# Patient Record
Sex: Female | Born: 1935 | Race: White | Hispanic: No | Marital: Married | Smoking: Never smoker
Health system: Southern US, Community
[De-identification: ages and names within clinical notes are randomized; demographics above are authoritative.]

## PROBLEM LIST (undated history)

## (undated) DIAGNOSIS — C801 Malignant (primary) neoplasm, unspecified: Secondary | ICD-10-CM

## (undated) DIAGNOSIS — M1990 Unspecified osteoarthritis, unspecified site: Secondary | ICD-10-CM

## (undated) DIAGNOSIS — C189 Malignant neoplasm of colon, unspecified: Secondary | ICD-10-CM

## (undated) DIAGNOSIS — C50919 Malignant neoplasm of unspecified site of unspecified female breast: Secondary | ICD-10-CM

## (undated) DIAGNOSIS — I10 Essential (primary) hypertension: Secondary | ICD-10-CM

## (undated) DIAGNOSIS — Z85038 Personal history of other malignant neoplasm of large intestine: Secondary | ICD-10-CM

## (undated) HISTORY — DX: Essential (primary) hypertension: I10

## (undated) HISTORY — DX: Malignant neoplasm of unspecified site of unspecified female breast: C50.919

## (undated) HISTORY — DX: Personal history of other malignant neoplasm of large intestine: Z85.038

## (undated) HISTORY — DX: Malignant neoplasm of colon, unspecified: C18.9

## (undated) HISTORY — PX: TONSILLECTOMY: SUR1361

## (undated) HISTORY — PX: COLONOSCOPY: SHX174

## (undated) HISTORY — PX: WISDOM TOOTH EXTRACTION: SHX21

## (undated) MED FILL — Docetaxel Soln for IV Infusion 160 MG/16ML: INTRAVENOUS | Qty: 9 | Status: AC

## (undated) MED FILL — Fam-Trastuzumab Deruxtecan-nxki For IV Soln 100 MG: INTRAVENOUS | Qty: 10 | Status: AC

## (undated) MED FILL — Zoledronic Acid Inj Conc For IV Infusion 4 MG/5ML: INTRAVENOUS | Qty: 3.75 | Status: AC

## (undated) MED FILL — Dexamethasone Sodium Phosphate Inj 100 MG/10ML: INTRAMUSCULAR | Qty: 1 | Status: AC

---

## 2005-05-10 HISTORY — PX: HEMICOLECTOMY: SHX854

## 2005-10-06 ENCOUNTER — Ambulatory Visit: Payer: Self-pay | Admitting: Oncology

## 2005-10-19 LAB — CBC WITH DIFFERENTIAL/PLATELET
BASO%: 1 % (ref 0.0–2.0)
HCT: 35.3 % (ref 34.8–46.6)
LYMPH%: 34.6 % (ref 14.0–48.0)
MCHC: 34.2 g/dL (ref 32.0–36.0)
MCV: 91.9 fL (ref 81.0–101.0)
MONO%: 8.8 % (ref 0.0–13.0)
NEUT%: 52.6 % (ref 39.6–76.8)
Platelets: 216 10*3/uL (ref 145–400)
RBC: 3.84 10*6/uL (ref 3.70–5.32)

## 2005-10-19 LAB — COMPREHENSIVE METABOLIC PANEL
ALT: 18 U/L (ref 0–40)
Albumin: 4.4 g/dL (ref 3.5–5.2)
CO2: 27 mEq/L (ref 19–32)
Calcium: 10 mg/dL (ref 8.4–10.5)
Chloride: 102 mEq/L (ref 96–112)
Potassium: 4.2 mEq/L (ref 3.5–5.3)
Sodium: 137 mEq/L (ref 135–145)
Total Protein: 7.2 g/dL (ref 6.0–8.3)

## 2005-10-19 LAB — LACTATE DEHYDROGENASE: LDH: 171 U/L (ref 94–250)

## 2005-10-19 LAB — PROTIME-INR: Protime: 11.2 Seconds (ref 10.6–13.4)

## 2005-11-16 ENCOUNTER — Ambulatory Visit: Payer: Self-pay | Admitting: Oncology

## 2005-11-22 LAB — CBC WITH DIFFERENTIAL/PLATELET
Basophils Absolute: 0 10*3/uL (ref 0.0–0.1)
Eosinophils Absolute: 0.1 10*3/uL (ref 0.0–0.5)
HCT: 35.3 % (ref 34.8–46.6)
HGB: 12.1 g/dL (ref 11.6–15.9)
LYMPH%: 37.7 % (ref 14.0–48.0)
MCV: 94.8 fL (ref 81.0–101.0)
MONO%: 12 % (ref 0.0–13.0)
NEUT#: 2.3 10*3/uL (ref 1.5–6.5)
NEUT%: 48.1 % (ref 39.6–76.8)
Platelets: 207 10*3/uL (ref 145–400)
RBC: 3.73 10*6/uL (ref 3.70–5.32)

## 2005-12-31 LAB — COMPREHENSIVE METABOLIC PANEL
AST: 26 U/L (ref 0–37)
Albumin: 3.8 g/dL (ref 3.5–5.2)
Alkaline Phosphatase: 68 U/L (ref 39–117)
BUN: 30 mg/dL — ABNORMAL HIGH (ref 6–23)
Potassium: 3.8 mEq/L (ref 3.5–5.3)
Total Bilirubin: 1.1 mg/dL (ref 0.3–1.2)

## 2005-12-31 LAB — CBC WITH DIFFERENTIAL/PLATELET
BASO%: 0.3 % (ref 0.0–2.0)
Eosinophils Absolute: 0 10*3/uL (ref 0.0–0.5)
MCHC: 34.8 g/dL (ref 32.0–36.0)
MCV: 98.7 fL (ref 81.0–101.0)
MONO#: 1.6 10*3/uL — ABNORMAL HIGH (ref 0.1–0.9)
MONO%: 25.8 % — ABNORMAL HIGH (ref 0.0–13.0)
NEUT#: 3.4 10*3/uL (ref 1.5–6.5)
RBC: 4.16 10*6/uL (ref 3.70–5.32)
RDW: 21.5 % — ABNORMAL HIGH (ref 11.3–14.5)
WBC: 6.3 10*3/uL (ref 3.9–10.0)

## 2006-01-06 ENCOUNTER — Ambulatory Visit: Payer: Self-pay | Admitting: Oncology

## 2006-03-18 ENCOUNTER — Ambulatory Visit: Payer: Self-pay | Admitting: Oncology

## 2006-03-22 LAB — COMPREHENSIVE METABOLIC PANEL
Albumin: 4.4 g/dL (ref 3.5–5.2)
BUN: 15 mg/dL (ref 6–23)
CO2: 25 mEq/L (ref 19–32)
Calcium: 9.2 mg/dL (ref 8.4–10.5)
Chloride: 106 mEq/L (ref 96–112)
Creatinine, Ser: 0.93 mg/dL (ref 0.40–1.20)
Glucose, Bld: 96 mg/dL (ref 70–99)

## 2006-03-22 LAB — CBC WITH DIFFERENTIAL/PLATELET
BASO%: 0.8 % (ref 0.0–2.0)
EOS%: 1.8 % (ref 0.0–7.0)
HCT: 36.1 % (ref 34.8–46.6)
LYMPH%: 41.3 % (ref 14.0–48.0)
MCH: 37.7 pg — ABNORMAL HIGH (ref 26.0–34.0)
MCHC: 35.4 g/dL (ref 32.0–36.0)
MCV: 106.4 fL — ABNORMAL HIGH (ref 81.0–101.0)
MONO%: 12.1 % (ref 0.0–13.0)
NEUT%: 44 % (ref 39.6–76.8)
Platelets: 191 10*3/uL (ref 145–400)
lymph#: 1.8 10*3/uL (ref 0.9–3.3)

## 2006-03-22 LAB — CEA: CEA: <0.5 ng/mL (ref 0.0–5.0)

## 2006-09-16 ENCOUNTER — Ambulatory Visit: Payer: Self-pay | Admitting: Oncology

## 2006-09-20 LAB — COMPREHENSIVE METABOLIC PANEL
AST: 20 U/L (ref 0–37)
Albumin: 4.4 g/dL (ref 3.5–5.2)
BUN: 18 mg/dL (ref 6–23)
CO2: 26 mEq/L (ref 19–32)
Calcium: 9.4 mg/dL (ref 8.4–10.5)
Chloride: 105 mEq/L (ref 96–112)
Potassium: 4.1 mEq/L (ref 3.5–5.3)

## 2006-09-20 LAB — CBC WITH DIFFERENTIAL/PLATELET
BASO%: 0.4 % (ref 0.0–2.0)
Basophils Absolute: 0 10*3/uL (ref 0.0–0.1)
Eosinophils Absolute: 0.1 10*3/uL (ref 0.0–0.5)
HCT: 41.4 % (ref 34.8–46.6)
MCHC: 35.2 g/dL (ref 32.0–36.0)
MONO#: 0.4 10*3/uL (ref 0.1–0.9)
NEUT%: 57.1 % (ref 39.6–76.8)
Platelets: 185 10*3/uL (ref 145–400)
lymph#: 1.6 10*3/uL (ref 0.9–3.3)

## 2006-09-20 LAB — LACTATE DEHYDROGENASE: LDH: 167 U/L (ref 94–250)

## 2007-03-17 ENCOUNTER — Ambulatory Visit: Payer: Self-pay | Admitting: Oncology

## 2007-03-21 LAB — COMPREHENSIVE METABOLIC PANEL
BUN: 15 mg/dL (ref 6–23)
CO2: 24 mEq/L (ref 19–32)
Calcium: 10 mg/dL (ref 8.4–10.5)
Chloride: 105 mEq/L (ref 96–112)
Creatinine, Ser: 0.91 mg/dL (ref 0.40–1.20)

## 2007-03-21 LAB — CBC WITH DIFFERENTIAL/PLATELET
Basophils Absolute: 0 10*3/uL (ref 0.0–0.1)
EOS%: 1.1 % (ref 0.0–7.0)
HCT: 40.3 % (ref 34.8–46.6)
HGB: 14.1 g/dL (ref 11.6–15.9)
MCH: 32.4 pg (ref 26.0–34.0)
NEUT%: 54.1 % (ref 39.6–76.8)
Platelets: 197 10*3/uL (ref 145–400)
lymph#: 1.7 10*3/uL (ref 0.9–3.3)

## 2007-03-21 LAB — LACTATE DEHYDROGENASE: LDH: 184 U/L (ref 94–250)

## 2007-09-08 ENCOUNTER — Ambulatory Visit: Payer: Self-pay | Admitting: Oncology

## 2008-03-22 ENCOUNTER — Ambulatory Visit: Payer: Self-pay | Admitting: Oncology

## 2008-03-26 LAB — CBC WITH DIFFERENTIAL/PLATELET
BASO%: 0.5 % (ref 0.0–2.0)
EOS%: 0.9 % (ref 0.0–7.0)
HCT: 41.3 % (ref 34.8–46.6)
MCH: 32.5 pg (ref 26.0–34.0)
MCHC: 34.8 g/dL (ref 32.0–36.0)
MONO#: 0.5 10*3/uL (ref 0.1–0.9)
NEUT%: 54.4 % (ref 39.6–76.8)
RDW: 12.9 % (ref 11.3–14.5)
WBC: 4.8 10*3/uL (ref 3.9–10.0)
lymph#: 1.6 10*3/uL (ref 0.9–3.3)

## 2008-03-26 LAB — COMPREHENSIVE METABOLIC PANEL
ALT: 11 U/L (ref 0–35)
AST: 19 U/L (ref 0–37)
Albumin: 4.4 g/dL (ref 3.5–5.2)
CO2: 26 mEq/L (ref 19–32)
Calcium: 9.4 mg/dL (ref 8.4–10.5)
Chloride: 105 mEq/L (ref 96–112)
Creatinine, Ser: 0.9 mg/dL (ref 0.40–1.20)
Potassium: 4.3 mEq/L (ref 3.5–5.3)
Sodium: 138 mEq/L (ref 135–145)
Total Protein: 7.2 g/dL (ref 6.0–8.3)

## 2008-03-26 LAB — LACTATE DEHYDROGENASE: LDH: 177 U/L (ref 94–250)

## 2008-09-06 ENCOUNTER — Ambulatory Visit: Payer: Self-pay | Admitting: Oncology

## 2009-09-08 ENCOUNTER — Ambulatory Visit: Payer: Self-pay | Admitting: Oncology

## 2009-09-09 LAB — CBC WITH DIFFERENTIAL/PLATELET
BASO%: 0.5 % (ref 0.0–2.0)
Basophils Absolute: 0 10*3/uL (ref 0.0–0.1)
EOS%: 1.6 % (ref 0.0–7.0)
MCH: 32.6 pg (ref 25.1–34.0)
MCHC: 34.2 g/dL (ref 31.5–36.0)
MCV: 95.3 fL (ref 79.5–101.0)
MONO%: 10.2 % (ref 0.0–14.0)
RBC: 4.2 10*6/uL (ref 3.70–5.45)
RDW: 12.9 % (ref 11.2–14.5)

## 2009-09-09 LAB — COMPREHENSIVE METABOLIC PANEL
ALT: 15 U/L (ref 0–35)
AST: 22 U/L (ref 0–37)
Albumin: 4.4 g/dL (ref 3.5–5.2)
Alkaline Phosphatase: 69 U/L (ref 39–117)
BUN: 19 mg/dL (ref 6–23)
Potassium: 4.4 mEq/L (ref 3.5–5.3)
Sodium: 138 mEq/L (ref 135–145)

## 2010-09-08 ENCOUNTER — Other Ambulatory Visit: Payer: Self-pay | Admitting: Oncology

## 2010-09-08 ENCOUNTER — Encounter (HOSPITAL_BASED_OUTPATIENT_CLINIC_OR_DEPARTMENT_OTHER): Payer: Medicare Other | Admitting: Oncology

## 2010-09-08 DIAGNOSIS — C187 Malignant neoplasm of sigmoid colon: Secondary | ICD-10-CM

## 2010-09-08 LAB — CBC WITH DIFFERENTIAL/PLATELET
BASO%: 0.5 % (ref 0.0–2.0)
Eosinophils Absolute: 0.1 10*3/uL (ref 0.0–0.5)
HCT: 40.3 % (ref 34.8–46.6)
LYMPH%: 28.6 % (ref 14.0–49.7)
MCHC: 34.7 g/dL (ref 31.5–36.0)
MONO#: 0.5 10*3/uL (ref 0.1–0.9)
NEUT%: 59.7 % (ref 38.4–76.8)
Platelets: 166 10*3/uL (ref 145–400)
WBC: 5 10*3/uL (ref 3.9–10.3)

## 2010-09-08 LAB — LACTATE DEHYDROGENASE: LDH: 187 U/L (ref 94–250)

## 2010-09-08 LAB — COMPREHENSIVE METABOLIC PANEL
CO2: 22 mEq/L (ref 19–32)
Creatinine, Ser: 1 mg/dL (ref 0.40–1.20)
Glucose, Bld: 93 mg/dL (ref 70–99)
Total Bilirubin: 0.5 mg/dL (ref 0.3–1.2)

## 2010-09-08 LAB — CEA: CEA: 1 ng/mL (ref 0.0–5.0)

## 2010-11-01 ENCOUNTER — Ambulatory Visit: Payer: Self-pay | Admitting: Orthopedic Surgery

## 2011-09-03 ENCOUNTER — Telehealth: Payer: Self-pay | Admitting: Oncology

## 2011-09-03 NOTE — Telephone Encounter (Signed)
S/w pt re appt for 7/5.

## 2011-11-12 ENCOUNTER — Encounter: Payer: Self-pay | Admitting: Oncology

## 2011-11-12 ENCOUNTER — Ambulatory Visit (HOSPITAL_BASED_OUTPATIENT_CLINIC_OR_DEPARTMENT_OTHER): Payer: Medicare Other | Admitting: Oncology

## 2011-11-12 ENCOUNTER — Telehealth: Payer: Self-pay | Admitting: Oncology

## 2011-11-12 ENCOUNTER — Other Ambulatory Visit (HOSPITAL_BASED_OUTPATIENT_CLINIC_OR_DEPARTMENT_OTHER): Payer: Medicare Other

## 2011-11-12 VITALS — BP 177/95 | HR 62 | Temp 97.0°F | Ht 63.0 in | Wt 146.9 lb

## 2011-11-12 DIAGNOSIS — Z85038 Personal history of other malignant neoplasm of large intestine: Secondary | ICD-10-CM

## 2011-11-12 DIAGNOSIS — C187 Malignant neoplasm of sigmoid colon: Secondary | ICD-10-CM

## 2011-11-12 HISTORY — DX: Personal history of other malignant neoplasm of large intestine: Z85.038

## 2011-11-12 LAB — COMPREHENSIVE METABOLIC PANEL
ALT: 12 U/L (ref 0–35)
AST: 22 U/L (ref 0–37)
CO2: 24 mEq/L (ref 19–32)
Calcium: 9.2 mg/dL (ref 8.4–10.5)
Chloride: 106 mEq/L (ref 96–112)
Sodium: 138 mEq/L (ref 135–145)
Total Bilirubin: 0.4 mg/dL (ref 0.3–1.2)
Total Protein: 6.8 g/dL (ref 6.0–8.3)

## 2011-11-12 LAB — CBC WITH DIFFERENTIAL/PLATELET
Eosinophils Absolute: 0.1 10*3/uL (ref 0.0–0.5)
MONO#: 0.6 10*3/uL (ref 0.1–0.9)
NEUT#: 2.6 10*3/uL (ref 1.5–6.5)
RBC: 4.36 10*6/uL (ref 3.70–5.45)
RDW: 13 % (ref 11.2–14.5)
WBC: 5.3 10*3/uL (ref 3.9–10.3)
lymph#: 2 10*3/uL (ref 0.9–3.3)

## 2011-11-12 LAB — LACTATE DEHYDROGENASE: LDH: 186 U/L (ref 94–250)

## 2011-11-12 LAB — CEA: CEA: 0.7 ng/mL (ref 0.0–5.0)

## 2011-11-12 NOTE — Telephone Encounter (Signed)
Gave pt appt for next year July 2014 lab and MD, pt was given a prescription by MD for mammogram in [place], she will maker her own appt.

## 2011-11-12 NOTE — Progress Notes (Signed)
Hematology and Oncology Follow Up Visit  Jasmin Lewis [id number] Jan 08, 1932 78 y.o. 11/12/2011 6:58 PM   Principle Diagnosis: Encounter Diagnosis  Name Primary?  . 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  She underwent surgical resection on 09/21/2005.  Findings were a 5.9 x 3.8 x 1.2 cm moderate to well-differentiated adenocarcinoma penetrating the muscular wall.  Forty lymph nodes negative.  No vascular or lymphatic invasion.  Multiple diverticula with microabscess formation and inflammation.  Carcinoma present in at least 1 diverticulum.  Preop CEA 5.2 with lab normal range 0 to 2.5.  Preop CT scan with no obvious additional pathology.  She received oral Xeloda chemotherapy as an adjuvant.    She continues to do well. She has had no interim medical problems. She denies any headache or change in vision, no change in bowel habit, no hematochezia or melena, no vaginal bleeding. No pain.  She never followed up and scheduled a 5 year interval colonoscopy or mammograms subsequent to her visit with me last year.  Medications: reviewed  Allergies: No Known Allergies  Review of Systems: Constitutional:   No constitutional symptoms Respiratory: No cough or dyspnea Cardiovascular:  No chest pain or palpitations Gastrointestinal: See above Genito-Urinary: See above Musculoskeletal: See above Neurologic: See above Skin: No rash Remaining ROS negative.  Physical Exam: Blood pressure 177/95, pulse 62, temperature 97 F (36.1 C), temperature source Oral, height 5\' 3"  (1.6 m), weight 146 lb 14.4 oz (66.633 kg). Wt Readings from Last 3 Encounters:  11/12/11 146 lb 14.4 oz (66.633 kg)     General appearance: Well-nourished Caucasian woman HENNT:  Pharynx no erythema or exudate Lymph nodes: No adenopathy Breasts: Not examined Lungs: Clear to auscultation  resonant to percussion Heart: Regular rhythm no murmur Abdomen: Soft nontender no mass no organomegaly Extremities: No edema no calf tenderness Vascular: No cyanosis Neurologic: No focal deficit Skin: No rash or ecchymosis  Lab Results: Lab Results  Component Value Date   WBC 5.3 11/12/2011   HGB 13.9 11/12/2011   HCT 41.0 11/12/2011   MCV 93.9 11/12/2011   PLT 157 11/12/2011     Chemistry      Component Value Date/Time   NA 138 11/12/2011 1113   K 4.5 11/12/2011 1113   CL 106 11/12/2011 1113   CO2 24 11/12/2011 1113   BUN 15 11/12/2011 1113   CREATININE 0.91 11/12/2011 1113      Component Value Date/Time   CALCIUM 9.2 11/12/2011 1113   ALKPHOS 73 11/12/2011 1113   AST 22 11/12/2011 1113   ALT 12 11/12/2011 1113   BILITOT 0.4 11/12/2011 1113    CEA 0.7   Radiological Studies: No results found.  Impression and Plan: #1. Stage II colon cancer treated as outlined above. She remains free from any obvious recurrence now out over 6 years. Once again she is advised to call her surgeon and schedule a followup colonoscopy. I think if this study is normal  she probably doesn't need any further studies in the future given her age.  #2. Routine health maintenance. Once again, I emphasized the importance of mammograms. She agreed to allow me to schedule a mammogram at [facility] close to her home.  CC:. Dr. Lois Huxley; Dr. Orma Flaming; Dr. Damita Lack in [place], [place] 7/5/20136:58 PM

## 2012-11-13 ENCOUNTER — Ambulatory Visit (HOSPITAL_BASED_OUTPATIENT_CLINIC_OR_DEPARTMENT_OTHER): Payer: Medicare Other | Admitting: Oncology

## 2012-11-13 ENCOUNTER — Other Ambulatory Visit (HOSPITAL_BASED_OUTPATIENT_CLINIC_OR_DEPARTMENT_OTHER): Payer: Medicare Other | Admitting: Lab

## 2012-11-13 VITALS — BP 173/67 | HR 62 | Temp 97.0°F | Resp 18 | Ht 63.0 in | Wt 143.1 lb

## 2012-11-13 DIAGNOSIS — Z85038 Personal history of other malignant neoplasm of large intestine: Secondary | ICD-10-CM

## 2012-11-13 LAB — CBC WITH DIFFERENTIAL/PLATELET
BASO%: 0.7 % (ref 0.0–2.0)
Basophils Absolute: 0 10*3/uL (ref 0.0–0.1)
EOS%: 1.3 % (ref 0.0–7.0)
HCT: 41.1 % (ref 34.8–46.6)
MCH: 31.8 pg (ref 25.1–34.0)
MCHC: 34.2 g/dL (ref 31.5–36.0)
MCV: 93 fL (ref 79.5–101.0)
MONO%: 10.1 % (ref 0.0–14.0)
NEUT%: 51.5 % (ref 38.4–76.8)
lymph#: 2.1 10*3/uL (ref 0.9–3.3)

## 2012-11-13 LAB — COMPREHENSIVE METABOLIC PANEL (CC13)
AST: 22 U/L (ref 5–34)
Albumin: 3.9 g/dL (ref 3.5–5.0)
Alkaline Phosphatase: 77 U/L (ref 40–150)
BUN: 18.3 mg/dL (ref 7.0–26.0)
Creatinine: 0.8 mg/dL (ref 0.6–1.1)
Glucose: 90 mg/dl (ref 70–140)
Potassium: 4.5 mEq/L (ref 3.5–5.1)
Total Bilirubin: 0.54 mg/dL (ref 0.20–1.20)

## 2012-11-13 NOTE — Progress Notes (Signed)
Hematology and Oncology Follow Up Visit  Jasmin Lewis [id number] 1931-08-30 79 y.o. 11/13/2012 4:20 PM   Principle Diagnosis: Encounter Diagnosis  Name Primary?  . 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 She underwent surgical resection on 09/21/2005. Findings were a 5.9 x 3.8 x 1.2 cm moderate to well-differentiated adenocarcinoma penetrating the muscular wall. Forty lymph nodes negative. No vascular or lymphatic invasion. Multiple diverticula with microabscess formation and inflammation. Carcinoma present in at least 1 diverticulum. Preop CEA 5.2 with lab normal range 0 to 2.5. Preop CT scan with no obvious additional pathology.  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 She received oral Xeloda chemotherapy as an adjuvant. She declined treatment on a clinical trial.  She is doing well. She is now out over 7 years from diagnosis. She took my advice at time of her last visit here and went on to have a followup colonoscopy done on 12/09/2011. She was found to have 2 polyps which were removed. Chronic diverticulosis. She had a mammogram on 12/28/2011 and this was also normal.  She reports no abdominal pain, no change in bowel habit, occasional constipation using when necessary laxatives. No hematochezia or melena. No other interim medical problems.   Medications: reviewed  Allergies: No Known Allergies  Review of Systems: Constitutional:   No constitutional symptoms Respiratory: No cough or dyspnea Cardiovascular:  No chest pain, pressure, or palpitations Gastrointestinal: See above Genito-Urinary: No vaginal bleeding Musculoskeletal: No muscle bone or joint pain Neurologic: No headache or change in vision Skin: No rash or ecchymosis Remaining ROS negative.  Physical Exam: Blood pressure 173/67,  pulse 62, temperature 97 F (36.1 C), temperature source Oral, resp. rate 18, height 5\' 3"  (1.6 m), weight 143 lb 1.6 oz (64.91 kg). Wt Readings from Last 3 Encounters:  11/13/12 143 lb 1.6 oz (64.91 kg)  11/12/11 146 lb 14.4 oz (66.633 kg)     General appearance: Well-nourished Caucasian woman HENNT: Pharynx no erythema or exudate Lymph nodes: No lymphadenopathy Breasts: Lungs: Clear to auscultation resonant to percussion Heart: Regular rhythm no murmur Abdomen: Soft, nontender, no mass, no organomegaly Extremities: No edema, no calf tenderness Musculoskeletal: No joint deformities GU: Vascular: No cyanosis Neurologic: Motor strength 5 over 5, reflexes 1+ symmetric Skin: No rash or ecchymosis  Lab Results: Lab Results  Component Value Date   WBC 5.7 11/13/2012   HGB 14.0 11/13/2012   HCT 41.1 11/13/2012   MCV 93.0 11/13/2012   PLT 175 11/13/2012     Chemistry      Component Value Date/Time   NA 137 11/13/2012 1057   NA 138 11/12/2011 1113   K 4.5 11/13/2012 1057   K 4.5 11/12/2011 1113   CL 106 11/12/2011 1113   CO2 26 11/13/2012 1057   CO2 24 11/12/2011 1113   BUN 18.3 11/13/2012 1057   BUN 15 11/12/2011 1113   CREATININE 0.8 11/13/2012 1057   CREATININE 0.91 11/12/2011 1113      Component Value Date/Time   CALCIUM 9.7 11/13/2012 1057   CALCIUM 9.2 11/12/2011 1113   ALKPHOS 77 11/13/2012 1057   ALKPHOS 73 11/12/2011 1113   AST 22 11/13/2012 1057   AST 22 11/12/2011 1113   ALT 15 11/13/2012 1057   ALT 12 11/12/2011 1113   BILITOT 0.54 11/13/2012 1057  BILITOT 0.4 11/12/2011 1113       Radiological Studies: No results found.  Impression: Stage II colon cancer treated as outlined above. She remains free of recurrence now out over 7 years. I told her she can graduate from our practice at this time. I would be happy to see her again in the future if the need arises.    CC:. Dr. Lois Huxley; Dr. Orma Flaming; Dr. Bosie Helper   Levert Feinstein, MD 7/7/20144:20 PM

## 2013-07-07 ENCOUNTER — Telehealth: Payer: Self-pay | Admitting: *Deleted

## 2013-07-07 ENCOUNTER — Encounter: Payer: Self-pay | Admitting: *Deleted

## 2013-07-07 NOTE — Telephone Encounter (Signed)
Former pt of DR. G...td  

## 2018-05-19 DIAGNOSIS — K635 Polyp of colon: Secondary | ICD-10-CM | POA: Insufficient documentation

## 2018-09-27 ENCOUNTER — Other Ambulatory Visit: Payer: Self-pay | Admitting: Family Medicine

## 2018-09-27 DIAGNOSIS — N6320 Unspecified lump in the left breast, unspecified quadrant: Secondary | ICD-10-CM

## 2018-10-06 ENCOUNTER — Other Ambulatory Visit: Payer: PRIVATE HEALTH INSURANCE

## 2018-10-11 ENCOUNTER — Ambulatory Visit
Admission: RE | Admit: 2018-10-11 | Discharge: 2018-10-11 | Disposition: A | Discharge status: Home / Self Care | Payer: Medicare Other | Source: Ambulatory Visit | Attending: Family Medicine | Admitting: Family Medicine

## 2018-10-11 ENCOUNTER — Other Ambulatory Visit: Payer: Self-pay

## 2018-10-11 ENCOUNTER — Other Ambulatory Visit: Payer: Self-pay | Admitting: Family Medicine

## 2018-10-11 DIAGNOSIS — N6320 Unspecified lump in the left breast, unspecified quadrant: Secondary | ICD-10-CM

## 2018-10-30 ENCOUNTER — Telehealth: Payer: Self-pay | Admitting: Hematology

## 2018-10-30 NOTE — Telephone Encounter (Signed)
Received a referral from [facility] primary care for new dx of breast cancer. Pt has been cld and scheduled to see Dr. Burr Medico on 6/25 at 230pm. Aware to arrive 20 minutes early. Provided the address and location to our office.

## 2018-11-02 ENCOUNTER — Other Ambulatory Visit: Payer: Self-pay

## 2018-11-02 ENCOUNTER — Inpatient Hospital Stay: Payer: Medicare Other

## 2018-11-02 ENCOUNTER — Encounter: Payer: Self-pay | Admitting: Hematology

## 2018-11-02 ENCOUNTER — Inpatient Hospital Stay: Payer: Medicare Other | Attending: Hematology | Admitting: Hematology

## 2018-11-02 DIAGNOSIS — Z79899 Other long term (current) drug therapy: Secondary | ICD-10-CM | POA: Insufficient documentation

## 2018-11-02 DIAGNOSIS — Z85038 Personal history of other malignant neoplasm of large intestine: Secondary | ICD-10-CM | POA: Diagnosis not present

## 2018-11-02 DIAGNOSIS — N6001 Solitary cyst of right breast: Secondary | ICD-10-CM | POA: Insufficient documentation

## 2018-11-02 DIAGNOSIS — C50412 Malignant neoplasm of upper-outer quadrant of left female breast: Secondary | ICD-10-CM | POA: Insufficient documentation

## 2018-11-02 DIAGNOSIS — C773 Secondary and unspecified malignant neoplasm of axilla and upper limb lymph nodes: Secondary | ICD-10-CM | POA: Diagnosis not present

## 2018-11-02 DIAGNOSIS — R600 Localized edema: Secondary | ICD-10-CM | POA: Insufficient documentation

## 2018-11-02 DIAGNOSIS — I10 Essential (primary) hypertension: Secondary | ICD-10-CM

## 2018-11-02 DIAGNOSIS — Z800 Family history of malignant neoplasm of digestive organs: Secondary | ICD-10-CM | POA: Diagnosis not present

## 2018-11-02 DIAGNOSIS — Z171 Estrogen receptor negative status [ER-]: Secondary | ICD-10-CM

## 2018-11-02 LAB — CMP (CANCER CENTER ONLY)
ALT: 21 U/L (ref 0–44)
AST: 30 U/L (ref 15–41)
Albumin: 4.3 g/dL (ref 3.5–5.0)
Alkaline Phosphatase: 69 U/L (ref 38–126)
Anion gap: 9 (ref 5–15)
BUN: 18 mg/dL (ref 8–23)
CO2: 25 mmol/L (ref 22–32)
Calcium: 9.6 mg/dL (ref 8.9–10.3)
Chloride: 106 mmol/L (ref 98–111)
Creatinine: 0.9 mg/dL (ref 0.44–1.00)
GFR, Est AFR Am: >60 mL/min (ref >60)
GFR, Estimated: 58 mL/min — ABNORMAL LOW (ref >60)
Glucose, Bld: 92 mg/dL (ref 70–99)
Potassium: 4.2 mmol/L (ref 3.5–5.1)
Sodium: 140 mmol/L (ref 135–145)
Total Bilirubin: 0.4 mg/dL (ref 0.3–1.2)
Total Protein: 7.8 g/dL (ref 6.5–8.1)

## 2018-11-02 LAB — CBC WITH DIFFERENTIAL (CANCER CENTER ONLY)
Abs Immature Granulocytes: 0.02 10*3/uL (ref 0.00–0.07)
Basophils Absolute: 0 10*3/uL (ref 0.0–0.1)
Basophils Relative: 1 %
Eosinophils Absolute: 0.1 10*3/uL (ref 0.0–0.5)
Eosinophils Relative: 1 %
HCT: 44.4 % (ref 36.0–46.0)
Hemoglobin: 14.7 g/dL (ref 12.0–15.0)
Immature Granulocytes: 0 %
Lymphocytes Relative: 28 %
Lymphs Abs: 1.7 10*3/uL (ref 0.7–4.0)
MCH: 31.3 pg (ref 26.0–34.0)
MCHC: 33.1 g/dL (ref 30.0–36.0)
MCV: 94.5 fL (ref 80.0–100.0)
Monocytes Absolute: 0.5 10*3/uL (ref 0.1–1.0)
Monocytes Relative: 8 %
Neutro Abs: 3.9 10*3/uL (ref 1.7–7.7)
Neutrophils Relative %: 62 %
Platelet Count: 185 10*3/uL (ref 150–400)
RBC: 4.7 MIL/uL (ref 3.87–5.11)
RDW: 12.3 % (ref 11.5–15.5)
WBC Count: 6.3 10*3/uL (ref 4.0–10.5)
nRBC: 0 % (ref 0.0–0.2)

## 2018-11-02 NOTE — Progress Notes (Signed)
START OFF PATHWAY REGIMEN - Breast   [id number]:Ado-Trastuzumab Emtansine 3.6 mg/kg IV D1 q21 Days:   A cycle is every 21 days:     Ado-trastuzumab emtansine   **Always confirm dose/schedule in your pharmacy ordering system**  Patient Characteristics: Preoperative or Nonsurgical Candidate (Clinical Staging), Neoadjuvant Therapy followed by Surgery, Invasive Disease, Chemotherapy, HER2 Positive, ER Negative/Unknown Therapeutic Status: Preoperative or Nonsurgical Candidate (Clinical Staging) AJCC M Category: cM0 AJCC Grade: G3 Breast Surgical Plan: Neoadjuvant Therapy followed by Surgery ER Status: Negative (-) AJCC 8 Stage Grouping: IIB HER2 Status: Positive (+) AJCC T Category: cT2 AJCC N Category: cN1 PR Status: Negative (-) Intent of Therapy: Curative Intent, Discussed with Patient

## 2018-11-02 NOTE — Progress Notes (Addendum)
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 Findings were a 5.9 x 3.8 x 1.2 cm moderate to well-differentiated adenocarcinoma penetrating the muscular wall. Forty lymph nodes negative. No vascular or lymphatic invasion. Multiple diverticula with microabscess formation and inflammation. Carcinoma present in at least 1 diverticulum. Preop CEA 5.2 with lab normal range 0 to 2.5. Preop CT scan with no obvious additional pathology.    2007 -  Chemotherapy   She received oral Xeloda chemotherapy as an adjuvant. She declined treatment on a clinical trial.    11/12/2011 Initial Diagnosis   [name], stage II   12/09/2011 Procedure   Followup colonoscopy done on 12/09/2011. She was found to have 2 polyps which were removed. Chronic diverticulosis.     Malignant neoplasm of upper-outer quadrant of left breast in female, estrogen receptor negative (Jasmin Lewis)  09/26/2018 Mammogram   Mammogram/US of left breast 09/26/18 IMPRESSION:  1. 2.9cm irregular mass in the upper outer left breast corresponds to the palpable abnormality. This is highly suspicious for breast carcinoma.  2. Two adjacent abnormal left axillary  Lymph nodes suspicious for metastatic adenopathy. There is a third borderline abnormal left axillary LN with a cortex thickened to 61m.  3. Benign right breast cyst. No evidence of right breast malignancy.    10/11/2018 Cancer Staging   Staging form: Breast, AJCC 8th Edition - Clinical stage from  10/11/2018: Stage IIB (cT2, cN1, cM0, G3, ER-, PR-, HER2+) - Signed by [name] MD on 11/02/2018   10/11/2018 Initial Biopsy   Diagnosis 10/11/18 1. Breast, left, needle core biopsy, upper outer left 2 o'clock - INVASIVE DUCTAL CARCINOMA. - DUCTAL CARCINOMA IN SITU. - LYMPHOVASCULAR INVASION IS IDENTIFIED. - SEE COMMENT. 2. Lymph node, needle/core biopsy, left axilla - INVASIVE DUCTAL CARCINOMA. - SEE COMMENT.   10/11/2018 Receptors her2   The tumor cells are POSITIVE for Her2 (3+). Estrogen Receptor: 0%, NEGATIVE Progesterone Receptor: 0%, NEGATIVE Proliferation Marker Ki67: 70%   11/02/2018 Initial Diagnosis   Malignant neoplasm of upper-outer quadrant of left breast in female, estrogen receptor negative ([name]      HISTORY OF PRESENTING ILLNESS:  Jasmin MUNSCH89y.o. female is a here because of newly diagnosed left breast cancer. The patient was referred by her PCP Dr DRosana Lewis The patient presents to the clinic today alone.   She found the lump herself which she felt for a few weeks. She had workup done by Dr DRosana Hoeswhich her breast cancer was diagnosed. She notes having felt cyst in her right breast years ago but did not have this removed . The cyst is still seen on her imaging. She denies family history of breast cancer.  She feels her left breast is getting bigger than her right. She denies changing of skin, appetite or energy level.   [place]. She has 3 adult children, a son and daughter who live in [place] She lives with her other son who is now  disabled due to firework accident. He is able to take care of himself and help with her and her husband. She is a retired from office work.  They have a PMHx of HTN. She has h/o of colon cancer treated with surgery and chemo Xeloda.  Her sister had colon cancer.   REVIEW OF SYSTEMS:    Constitutional: Denies fevers, chills or abnormal night sweats Eyes: Denies blurriness of vision, double vision  or watery eyes Ears, nose, mouth, throat, and face: Denies mucositis or sore throat Respiratory: Denies cough, dyspnea or wheezes Cardiovascular: Denies palpitation, chest discomfort (+) lower extremity swelling Gastrointestinal:  Denies nausea, heartburn or change in bowel habits Skin: Denies abnormal skin rashes Lymphatics: Denies new lymphadenopathy or easy bruising Neurological:Denies numbness, tingling or new weaknesses Behavioral/Psych: Mood is stable, no new changes  BREAST: (+) palpable left breast mass, left breast larger than right  All other systems were reviewed with the patient and are negative.   MEDICAL HISTORY:  Past Medical History:  Diagnosis Date   [name], stage II 11/12/2011   Sigmoid lesion 5.9 cm  40 nodes negative but focus of cancer in a diverticum   Pre-op CEA 5.2 with lab normal up to 2.5 resected 09/21/05   Xeloda adjuvant chemotherapy   Hypertension     SURGICAL HISTORY: Past Surgical History:  Procedure Laterality Date   HEMICOLECTOMY  2007    SOCIAL HISTORY: Social History   Socioeconomic History   Marital status: Married    Spouse name: Not on file   Number of children: 3   Years of education: Not on file   Highest education level: Not on file  Occupational History   Not on file  Social Needs   Financial resource strain: Not on file   Food insecurity    Worry: Not on file    Inability: Not on file   Transportation needs    Medical: Not on file    Non-medical: Not on file  Tobacco Use   Smoking status: Never Smoker   Smokeless tobacco: Never Used  Substance and Sexual Activity   Alcohol use: Never    Frequency: Never   Drug use: Never   Sexual activity: Not on file  Lifestyle   Physical activity    Days per week: Not on file    Minutes per session: Not on file   Stress: Not on file  Relationships   Social connections    Talks on phone: Not on file    Gets together: Not on file    Attends religious  service: Not on file    Active member of club or organization: Not on file    Attends meetings of clubs or organizations: Not on file    Relationship status: Not on file   Intimate partner violence    Fear of current or ex partner: Not on file    Emotionally abused: Not on file    Physically abused: Not on file    Forced sexual activity: Not on file  Other Topics Concern   Not on file  Social History Narrative   Not on file    FAMILY HISTORY: Family History  Problem Relation Age of Onset   Cancer Maternal Aunt        colon cancer     ALLERGIES:  has No Known Allergies.  MEDICATIONS:  Current Outpatient Medications  Medication Sig Dispense Refill   amLODipine (NORVASC) 5 MG tablet Take by mouth.     calcium  citrate-vitamin D (CITRACAL+D) 315-200 MG-UNIT per tablet Take 1 tablet by mouth daily.     fish oil-omega-3 fatty acids 1000 MG capsule Take 1 g by mouth daily.     Zoster Vaccine Adjuvanted [facility]) injection 0.5 mL IM x 1 then repeat in 2 months     No current facility-administered medications for this visit.     PHYSICAL EXAMINATION: ECOG PERFORMANCE STATUS: 0 - Asymptomatic  Vitals:   11/02/18 1359  BP: (!) 166/61  Pulse: 82  Resp: 20  Temp: 99.1 F (37.3 C)  SpO2: 95%   Filed Weights   11/02/18 1359  Weight: 137 lb 8 oz (62.4 kg)    GENERAL:alert, no distress and comfortable SKIN: skin color, texture, turgor are normal, no rashes or significant lesions EYES: normal, Conjunctiva are pink and non-injected, sclera clear  NECK: supple, thyroid normal size, non-tender, without nodularity LYMPH:  no palpable lymphadenopathy in the cervical, axillary  LUNGS: clear to auscultation and percussion with normal breathing effort HEART: regular rate & rhythm and no murmurs (+) mild b/l pitting edema   ABDOMEN:abdomen soft, non-tender and normal bowel sounds Musculoskeletal:no cyanosis of digits and no clubbing  NEURO: alert & oriented x 3 with fluent  speech, no focal motor/sensory deficits BREAST: (+) Palpable 2.5x3.5cm at 2:00 position of left breast about 4cm from nipple  LABORATORY DATA:  I have reviewed the data as listed CBC Latest Ref Rng & Units 11/02/2018 11/13/2012 11/12/2011  WBC 4.0 - 10.5 K/uL 6.3 5.7 5.3  Hemoglobin 12.0 - 15.0 g/dL 14.7 14.0 13.9  Hematocrit 36.0 - 46.0 % 44.4 41.1 41.0  Platelets 150 - 400 K/uL 185 175 157    CMP Latest Ref Rng & Units 11/02/2018 11/13/2012 11/12/2011  Glucose 70 - 99 mg/dL 92 90 90  BUN 8 - 23 mg/dL 18 18.3 15  Creatinine 0.44 - 1.00 mg/dL 0.90 0.8 0.91  Sodium 135 - 145 mmol/L 140 137 138  Potassium 3.5 - 5.1 mmol/L 4.2 4.5 4.5  Chloride 98 - 111 mmol/L 106 - 106  CO2 22 - 32 mmol/L _0 Calcium 8.9 - 10.3 mg/dL 9.6 9.7 9.2  Total Protein 6.5 - 8.1 g/dL 7.8 7.4 6.8  Total Bilirubin 0.3 - 1.2 mg/dL 0.4 0.54 0.4  Alkaline Phos 38 - 126 U/L 69 77 73  AST 15 - 41 U/L _1 ALT 0 - 44 U/L _2 RADIOGRAPHIC STUDIES: I have personally reviewed the radiological images as listed and agreed with the findings in the report. [place] Axillary Node Core Biopsy Left  Addendum Date: 10/12/2018   ADDENDUM REPORT: 10/12/2018 13:14 ADDENDUM: Pathology revealed GRADE III INVASIVE DUCTAL CARCINOMA, DUCTAL CARCINOMA IN SITU, LYMPHOVASCULAR INVASION IS IDENTIFIED of the Left breast, upper outer, 2 o'clock. INVASIVE DUCTAL CARCINOMA of the Left axilla. Lymph nodal tissue is not definitively identified. This was found to be concordant by Dr. Lovey Newcomer. Pathology results will be discussed with the patient by Dr. Alean Rinne of [facility] in [place], [place], per his request. Dr. Alean Rinne will arrange a surgical referral. Imaging and pathology reports were faxed to [facility] on October 12, 2018. Pathology results reported by Terie Purser, RN on 10/12/2018. Electronically Signed   By: Lovey Newcomer M.D.   On: 10/12/2018 13:14   Result Date: 10/12/2018 CLINICAL DATA:  Patient for  ultrasound-guided core needle biopsy left breast mass and left axillary node. EXAM: ULTRASOUND GUIDED LEFT BREAST CORE NEEDLE  BIOPSY COMPARISON:  Previous exam(s). FINDINGS: I met with the patient and we discussed the procedure of ultrasound-guided biopsy, including benefits and alternatives. We discussed the high likelihood of a successful procedure. We discussed the risks of the procedure, including infection, bleeding, tissue injury, clip migration, and inadequate sampling. Informed written consent was given. The usual time-out protocol was performed immediately prior to the procedure. Site 1: Left breast mass 2 o'clock position: Ribbon shaped clip. Lesion quadrant: Upper outer quadrant Using sterile technique and 1% Lidocaine as local anesthetic, under direct ultrasound visualization, a 14 gauge spring-loaded device was used to perform biopsy of left breast mass 2 o'clock position using a lateral approach. At the conclusion of the procedure a ribbon shaped tissue marker clip was deployed into the biopsy cavity. Follow up 2 view mammogram was performed and dictated separately. Site 2: Left axillary lymph node.  HydroMARK clip. Using sterile technique and 1% Lidocaine as local anesthetic, under direct ultrasound visualization, a 14 gauge spring-loaded device was used to perform biopsy of left axillary lymph node using a lateral approach. At the conclusion of the procedure a HydroMARK tissue marker clip was deployed into the biopsy cavity. Follow up 2 view mammogram was performed and dictated separately. IMPRESSION: Ultrasound guided biopsy of left breast mass and left axillary lymph node. No apparent complications. Electronically Signed: By: Lovey Newcomer M.D. On: 10/11/2018 14:45   Mm Clip Placement Left  Result Date: 10/11/2018 CLINICAL DATA:  Patient status post ultrasound-guided core needle biopsy left breast mass and left axillary lymph node. EXAM: DIAGNOSTIC LEFT MAMMOGRAM POST ULTRASOUND BIOPSY COMPARISON:   Previous exam(s). FINDINGS: Mammographic images were obtained following ultrasound guided biopsy of left breast mass and left axillary lymph node. Site 1: Left breast mass 2 o'clock position: Ribbon shaped marking clip: In appropriate position. Site 2: Left axillary lymph node: HydroMARK clip: Not able to be visualized on current exams due to posterior location. IMPRESSION: Appropriate position ribbon shaped marking clip within the left breast mass. HydroMARK clip is not able to be visualized on mammography due to deep location within the axilla. Final Assessment: Post Procedure Mammograms for Marker Placement Electronically Signed   By: Lovey Newcomer M.D.   On: 10/11/2018 14:46   [place] Lt Breast Bx W Loc Dev 1st Lesion Img Bx Spec US Guide  Addendum Date: 10/12/2018   ADDENDUM REPORT: 10/12/2018 13:14 ADDENDUM: Pathology revealed GRADE III INVASIVE DUCTAL CARCINOMA, DUCTAL CARCINOMA IN SITU, LYMPHOVASCULAR INVASION IS IDENTIFIED of the Left breast, upper outer, 2 o'clock. INVASIVE DUCTAL CARCINOMA of the Left axilla. Lymph nodal tissue is not definitively identified. This was found to be concordant by Dr. Lovey Newcomer. Pathology results will be discussed with the patient by Dr. Alean Rinne of [facility] in [place], [place], per his request. Dr. Alean Rinne will arrange a surgical referral. Imaging and pathology reports were faxed to [facility] on October 12, 2018. Pathology results reported by Terie Purser, RN on 10/12/2018. Electronically Signed   By: Lovey Newcomer M.D.   On: 10/12/2018 13:14   Result Date: 10/12/2018 CLINICAL DATA:  Patient for ultrasound-guided core needle biopsy left breast mass and left axillary node. EXAM: ULTRASOUND GUIDED LEFT BREAST CORE NEEDLE BIOPSY COMPARISON:  Previous exam(s). FINDINGS: I met with the patient and we discussed the procedure of ultrasound-guided biopsy, including benefits and alternatives. We discussed the high likelihood of a successful procedure. We discussed  the risks of the procedure, including infection, bleeding, tissue injury, clip migration, and inadequate  sampling. Informed written consent was given. The usual time-out protocol was performed immediately prior to the procedure. Site 1: Left breast mass 2 o'clock position: Ribbon shaped clip. Lesion quadrant: Upper outer quadrant Using sterile technique and 1% Lidocaine as local anesthetic, under direct ultrasound visualization, a 14 gauge spring-loaded device was used to perform biopsy of left breast mass 2 o'clock position using a lateral approach. At the conclusion of the procedure a ribbon shaped tissue marker clip was deployed into the biopsy cavity. Follow up 2 view mammogram was performed and dictated separately. Site 2: Left axillary lymph node.  HydroMARK clip. Using sterile technique and 1% Lidocaine as local anesthetic, under direct ultrasound visualization, a 14 gauge spring-loaded device was used to perform biopsy of left axillary lymph node using a lateral approach. At the conclusion of the procedure a HydroMARK tissue marker clip was deployed into the biopsy cavity. Follow up 2 view mammogram was performed and dictated separately. IMPRESSION: Ultrasound guided biopsy of left breast mass and left axillary lymph node. No apparent complications. Electronically Signed: By: Lovey Newcomer M.D. On: 10/11/2018 14:45    ASSESSMENT & PLAN:  DALEXA GENTZ is a 85 y.o. Caucasian female with a history of HTN and colon cancer.    1.  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 She presented with a palpable left breast mass and was found to have invasive ductal carcinoma of left breast with left axillary node metastasis. 2 abnormal LNs were seen on [place], 1 was biopsied. Her tumor is negative for ER and PR, HER2 positive, which is more aggressive  -Given  her locally advanced disease and the agressive nature of her breast cancer, I recommend  PET (if insurance not approved, will do CT CAP/bone scan) to rule out distant metastasis. I also recommend breast MRI to rule out multifocal disease, and for comparison after neoadjuvant chemo. She is agreeable.  -If no distant metastasis, surgery is the standard treatment to cure her breast cancer. I will refer her to [facility] for surgery consult.  -She has high risk for future breast cancer recurrence after surgery due to her locally advanced disease and agreessive nature of her disease. She has a 30-40% chance of cancer recurrence after surgery.  -To reduce her risk of recurrence, I recommend neoadjuvant or adjuvant chemo. She is 24, not a candidate for intensive chemo, but she has good overall health and PS. I discussed the option of weekly Taxol for [redacted] weeks along with anti-her2 Herceptin/Perjeta for one year, or chemo-conjugate T-DM1 (Kadcyla) and Perjeta for a total 1 year treatment.  Based on the [facility] trial, 44% patients achieved pCR with T-DM1 and Perjeta, although the pCR was lower than the standard chemotherapy (TCHP) in the neoadjuvant setting. I recommend neoadjuvant T-DM1 and Perjeta for 6 cycles, for downstaging.   --Chemotherapy consent: Side effects including but does not limited to, fatigue, nausea, vomiting, diarrhea, hair loss, neuropathy, fluid retention, renal and kidney dysfunction, neutropenic fever, needed for blood transfusion, bleeding, were discussed with patient in great detail.  -She opted to proceed with Kadcyla. If not responding well, may switch to chemo option. She understands.  -the goal of care is curative if staging scan is negative for distant metastasis  -Will monitor her heart function on Herceptin with ECHOs -Given her long term IV treatment I recommend PAC placement. She would like to wait on  this.  -Will complete chemo education class before start of treatment.  -Given her  positive lymph nodes, I discussed the option of adjuvant radiation to reduce the risk for local recurrence. Will refer her to [place] after surgery if needed.  -Given the strong ER and PR negative expression I do not recommend anti-estrogen therapy.  -Physical exam today shows Palpable 2.5x3.5cm at 2:00 position of left breast about 4cm from nipple.  -Will obtain baseline labs today  -F/u in 2 weeks to start first cycle chemo  -will get port during her breast surgery, ok to use her peripheral vein for neoadjuvant therapy  -I offered to call her husband and children to update her condition, she declined.  2. 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 S/p adjuvant Xeloda.     3. Genetic Testing  -Given her history of cancer and sister had colon cancer, I recommend genetic testing to rule out inheritable mutations. She is interested.  -Will send genetic referral.   4. HTN, B/l LE pitting edema  -Recently started on Amlodipine  -BP at 166/61 today (11/02/18) -She has b/l LE edema with recent onset in the last 3 days.  -I recommend she elevate her feet when sitting and to use compression socks. I also encouraged her to reduce salt intake.   PLAN:  -Labs today  -surgical referral  -MRI breast w and wo contrast, PET scan or CT CAP/bone scan, ECHO, chemo education class in 1-2 weeks  -Lab, f/u and Kadcyla and perjeta in 2 weeks    Orders Placed This Encounter  Procedures   MR BREAST BILATERAL W [place] CAD    Standing Status:   Future    Standing Expiration Date:   01/02/2020    Order Specific Question:   If indicated for the ordered procedure, I authorize the administration of contrast media per Radiology protocol    Answer:   Yes    Order Specific Question:   What is the patient's sedation requirement?    Answer:   No Sedation    Order Specific Question:   Does the patient have a pacemaker or implanted devices?     Answer:   No    Order Specific Question:   Radiology Contrast Protocol - do NOT remove file path    Answer:   \[email or web address]    Order Specific Question:   Preferred imaging location?    Answer:   [facility] (table limit-350 lbs)   NM PET Image Initial (PI) Skull Base To Thigh    Standing Status:   Future    Standing Expiration Date:   11/02/2019    Order Specific Question:   If indicated for the ordered procedure, I authorize the administration of a radiopharmaceutical per Radiology protocol    Answer:   Yes    Order Specific Question:   Preferred imaging location?    Answer:   [facility]    Order Specific Question:   Radiology Contrast Protocol - do NOT remove file path    Answer:   \[email or web address]   CBC with Differential (Industry Only)    Standing Status:   Standing    Number of Occurrences:   100    Standing Expiration Date:   11/02/2023   CMP ([place] only)    Standing Status:   Standing    Number of Occurrences:   100    Standing Expiration Date:  11/02/2023   CA 27.29    Standing Status:   Standing    Number of Occurrences:   100    Standing Expiration Date:   11/02/2023   Ambulatory referral to General Surgery    Referral Priority:   Routine    Referral Type:   Surgical    Referral Reason:   Specialty Services Required    Requested Specialty:   General Surgery    Number of Visits Requested:   1   ECHO TEE (2D Echo)    Standing Status:   Future    Standing Expiration Date:   02/02/2020    Order Specific Question:   Where should this be performed?    Answer:   [facility]    Order Specific Question:   Reason for exam    Answer:   Other-Full Diagnosis List    Order Specific Question:   Full ICD-10/Reason for Exam    Answer:   Chemotherapy adverse reaction, initial encounter [859292]    All questions were answered. The patient knows to call the clinic with any problems, questions or  concerns. I spent 55 minutes counseling the patient face to face. The total time spent in the appointment was 60 minutes and more than 50% was on counseling.     Truitt Merle, MD 11/02/2018 4:31 PM  I, Joslyn Devon, am acting as scribe for Truitt Merle, MD.   I have reviewed the above documentation for accuracy and completeness, and I agree with the above.

## 2018-11-03 ENCOUNTER — Telehealth: Payer: Self-pay | Admitting: Hematology

## 2018-11-03 ENCOUNTER — Telehealth: Payer: Self-pay | Admitting: *Deleted

## 2018-11-03 ENCOUNTER — Encounter: Payer: Self-pay | Admitting: *Deleted

## 2018-11-03 ENCOUNTER — Other Ambulatory Visit: Payer: Self-pay | Admitting: *Deleted

## 2018-11-03 LAB — CANCER ANTIGEN 27.29: CA 27.29: 44.4 U/mL — ABNORMAL HIGH (ref 0.0–38.6)

## 2018-11-03 NOTE — Telephone Encounter (Signed)
-----   Message from Truitt Merle, MD sent at 11/03/2018  7:46 AM EDT ----- Please let pt know her lab CBC, CMP were WNL, tumor marker slightly elevated, will monitor. Thanks   Truitt Merle  11/03/2018

## 2018-11-03 NOTE — Telephone Encounter (Signed)
Received message that pt called back. Attempted call back to patient . No answer but was able to leave vm message regarding lab results and future appts.

## 2018-11-03 NOTE — Telephone Encounter (Signed)
TCT patient regarding lab results from 11/02/18. Spoke with husband and he will have his wife call back as she is not home at this time. Message is that pt's labs from yesterday (CBC, CMP) are normal. Her tumor marker-CA 27.29 is slightly elevated and Dr. Burr Medico will continue to monitor. Patient's next appt is 11/13/18 for Chemo education and then 11/17/18 for labs, Dr. Burr Medico and then chemotherapy.

## 2018-11-03 NOTE — Telephone Encounter (Signed)
Scheduled appt per 6/25 los. Spoke with patient and she is aware of her appt date and time.

## 2018-11-06 ENCOUNTER — Other Ambulatory Visit: Payer: Self-pay | Admitting: *Deleted

## 2018-11-09 ENCOUNTER — Telehealth: Payer: Self-pay | Admitting: *Deleted

## 2018-11-09 NOTE — Telephone Encounter (Signed)
Contacted patient to verify telephone visit for pre reg °

## 2018-11-09 NOTE — Progress Notes (Signed)
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 Findings were a 5.9 x 3.8 x 1.2 cm moderate to well-differentiated adenocarcinoma penetrating the muscular wall. Forty lymph nodes negative. No vascular or lymphatic invasion. Multiple diverticula with microabscess formation and inflammation. Carcinoma present in at least 1 diverticulum. Preop CEA 5.2 with lab normal range 0 to 2.5. Preop CT scan with no obvious additional pathology.    2007 -  Chemotherapy   She received oral Xeloda chemotherapy as an adjuvant. She declined treatment on a clinical trial.    11/12/2011 Initial Diagnosis   [name], stage II   12/09/2011 Procedure   Followup colonoscopy done on 12/09/2011. She was found to have 2 polyps which were removed. Chronic diverticulosis.     Malignant neoplasm of upper-outer quadrant of left breast in female, estrogen receptor negative (Jasmin Lewis)  09/26/2018 Mammogram   Mammogram/US of left breast 09/26/18 IMPRESSION:  1. 2.9cm irregular mass in the upper outer left breast corresponds to the palpable abnormality. This is highly suspicious for breast carcinoma.  2. Two adjacent abnormal left axillary  Lymph nodes suspicious for metastatic adenopathy. There is a third borderline abnormal left axillary LN with a cortex thickened to 53m.  3. Benign right breast cyst. No evidence of right breast malignancy.    10/11/2018 Cancer Staging   Staging form: Breast, AJCC 8th Edition - Clinical stage from 10/11/2018: Stage IIB (cT2, cN1, cM0, G3, ER-, PR-,  HER2+) - Signed by [name] MD on 11/02/2018   10/11/2018 Initial Biopsy   Diagnosis 10/11/18 1. Breast, left, needle core biopsy, upper outer left 2 o'clock - INVASIVE DUCTAL CARCINOMA. - DUCTAL CARCINOMA IN SITU. - LYMPHOVASCULAR INVASION IS IDENTIFIED. - SEE COMMENT. 2. Lymph node, needle/core biopsy, left axilla - INVASIVE DUCTAL CARCINOMA. - SEE COMMENT.   10/11/2018 Receptors her2   The tumor cells are POSITIVE for Her2 (3+). Estrogen Receptor: 0%, NEGATIVE Progesterone Receptor: 0%, NEGATIVE Proliferation Marker Ki67: 70%   11/02/2018 Initial Diagnosis   Malignant neoplasm of upper-outer quadrant of left breast in female, estrogen receptor negative (HSoda Lewis   11/15/2018 Breast MRI   MRI breast 11/15/18  IMPRESSION: 1. 3.3 centimeter mass in the LATERAL portion of the LEFT breast consistent with known malignancy. 2. There is significant non mass enhancement surrounding this mass and extending anteriorly into the nipple base, with largest diameter in the anterior to posterior axis, measuring 7.2 centimeters. 3. If the patient would consider breast conservation, additional MR guided core biopsies are recommended. Consider biopsy of the inferior and anterior extent of the non mass enhancement to document extent of disease. 4. Three enlarged LEFT axillary lymph nodes. 5. RIGHT breast is negative.   11/15/2018 PET scan   PET 11/15/18 IMPRESSION: Hypermetabolic left breast lesion compatible with known primary. Hypermetabolic left axillary lymph nodes are consistent with metastatic disease.   No evidence for additional hypermetabolic metastatic involvement in the neck, chest, abdomen, or pelvis.   11/17/2018 -  Chemotherapy   Neo-adjuvant Kadcyla and perjeta q3weeks for 6 cycles starting 11/17/18, will continue  Perjeta for 1 year total treatment.       CURRENT THERAPY:  Kadcyla and perjeta q3weeks starting 11/17/18  INTERVAL HISTORY:  Jasmin Lewis is here for a follow up and  start of treatment. She presents to the clinic alone. She notes she is doing well and stable. She plans to f/u with Dr. [name] soon. He says surgery will depend on her systemic treatment. She is open to any surgery, but if possible she would like lumpectomy.     REVIEW OF SYSTEMS:   Constitutional: Denies fevers, chills or abnormal weight loss Eyes: Denies blurriness of vision Ears, nose, mouth, throat, and face: Denies mucositis or sore throat Respiratory: Denies cough, dyspnea or wheezes Cardiovascular: Denies palpitation, chest discomfort or lower extremity swelling Gastrointestinal:  Denies nausea, heartburn or change in bowel habits Skin: Denies abnormal skin rashes Lymphatics: Denies new lymphadenopathy or easy bruising Neurological:Denies numbness, tingling or new weaknesses Behavioral/Psych: Mood is stable, no new changes  All other systems were reviewed with the patient and are negative.  MEDICAL HISTORY:  Past Medical History:  Diagnosis Date  . [name], stage II 11/12/2011   Sigmoid lesion 5.9 cm  40 nodes negative but focus of cancer in a diverticum   Pre-op CEA 5.2 with lab normal up to 2.5 resected 09/21/05   Xeloda adjuvant chemotherapy  . Hypertension     SURGICAL HISTORY: Past Surgical History:  Procedure Laterality Date  . HEMICOLECTOMY  2007    I have reviewed the social history and family history with the patient and they are unchanged from previous note.  ALLERGIES:  has No Known Allergies.  MEDICATIONS:  Current Outpatient Medications  Medication Sig Dispense Refill  . amLODipine (NORVASC) 5 MG tablet Take by mouth.    . calcium citrate-vitamin D (CITRACAL+D) 315-200 MG-UNIT per tablet Take 1 tablet by mouth daily.    . fish oil-omega-3 fatty acids 1000 MG capsule Take 1 g by mouth daily.    Marland Kitchen Zoster Vaccine Adjuvanted [facility]) injection 0.5 mL IM x 1 then repeat in 2 months    . ondansetron (ZOFRAN) 8 MG tablet Take 1 tablet (8 mg total) by mouth  every 8 (eight) hours as needed for nausea or vomiting. 20 tablet 2   No current facility-administered medications for this visit.    Facility-Administered Medications Ordered in Other Visits  Medication Dose Route Frequency Provider Last Rate Last Dose  . fludeoxyglucose F - 18 (FDG) injection 6.8 millicurie  6.8 millicurie Intravenous Once PRN Lorin Picket, MD      . pertuzumab (PERJETA) 840 mg in sodium chloride 0.9 % 250 mL chemo infusion  840 mg Intravenous Once Truitt Merle, MD 278 mL/hr at 11/17/18 1426 840 mg at 11/17/18 1426    PHYSICAL EXAMINATION: ECOG PERFORMANCE STATUS: 0 - Asymptomatic  Vitals:   11/17/18 0838  BP: (!) 162/67  Pulse: 70  Resp: 20  Temp: 99.1 F (37.3 C)  SpO2: 98%   Filed Weights   11/17/18 0838  Weight: 137 lb 1.6 oz (62.2 kg)    GENERAL:alert, no distress and comfortable SKIN: skin color, texture, turgor are normal, no rashes or significant lesions EYES: normal, Conjunctiva are pink and non-injected, sclera clear  NECK: supple, thyroid normal size, non-tender, without nodularity LYMPH:  no palpable lymphadenopathy in the cervical, axillary  LUNGS: clear to auscultation and percussion with normal breathing effort HEART: regular rate & rhythm and no murmurs and no lower extremity edema ABDOMEN:abdomen soft, non-tender and normal bowel  sounds Musculoskeletal:no cyanosis of digits and no clubbing  NEURO: alert & oriented x 3 with fluent speech, no focal motor/sensory deficits BREAST: (+) Palpable 2.5x4.5cm mass at 2:00 position of left breast, no skin change, (+) No palpable adenopathy (+) Right breast benign.   LABORATORY DATA:  I have reviewed the data as listed CBC Latest Ref Rng & Units 11/17/2018 11/02/2018 11/13/2012  WBC 4.0 - 10.5 K/uL 4.5 6.3 5.7  Hemoglobin 12.0 - 15.0 g/dL 13.8 14.7 14.0  Hematocrit 36.0 - 46.0 % 40.8 44.4 41.1  Platelets 150 - 400 K/uL 172 185 175     CMP Latest Ref Rng & Units 11/17/2018 11/02/2018 11/13/2012   Glucose 70 - 99 mg/dL 109(H) 92 90  BUN 8 - 23 mg/dL 19 18 18.3  Creatinine 0.44 - 1.00 mg/dL 0.80 0.90 0.8  Sodium 135 - 145 mmol/L 140 140 137  Potassium 3.5 - 5.1 mmol/L 4.1 4.2 4.5  Chloride 98 - 111 mmol/L 109 106 -  CO2 22 - 32 mmol/L 22 25 26   Calcium 8.9 - 10.3 mg/dL 9.1 9.6 9.7  Total Protein 6.5 - 8.1 g/dL 7.2 7.8 7.4  Total Bilirubin 0.3 - 1.2 mg/dL 0.4 0.4 0.54  Alkaline Phos 38 - 126 U/L 69 69 77  AST 15 - 41 U/L 28 30 22   ALT 0 - 44 U/L 17 21 15    Baseline ECHO 11/10/18  IMPRESSIONS  1. The left ventricle has hyperdynamic systolic function, with an ejection fraction of >65%. The cavity size was normal. Left ventricular diastolic Doppler parameters are consistent with impaired relaxation. Indeterminate filling pressures The E/e' is  8-15. No evidence of left ventricular regional wall motion abnormalities.  2. The right ventricle has normal systolic function. The cavity was normal. There is no increase in right ventricular wall thickness.  3. The mitral valve is grossly normal.  4. The tricuspid valve is grossly normal.  5. The aortic valve is tricuspid. Mild sclerosis of the aortic valve. Aortic valve regurgitation is trivial by color flow Doppler. No stenosis of the aortic valve.  6. The average left ventricular global longitudinal strain is -15.9 %.    RADIOGRAPHIC STUDIES: I have personally reviewed the radiological images as listed and agreed with the findings in the report. No results found.   ASSESSMENT & PLAN:  ALYSON KI is a 85 y.o. female with   1.  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 She presented with a palpable left breast mass and was found to have invasive ductal carcinoma with left axillary node metastasis. 2 abnormal LNs were seen on [place], 1 was biopsied. Her tumor is negative for ER and PR, HER2 positive, which  is more aggressive  -I personally reviewed and discussed her breast MRI and PET scan from 11/15/18 which showed no right breast or distant metastasis. There is a 7.2cm non mass enhancement area just outside of known mass, which is concerning for more disease. Since pt prefers to have lumpectomy if possible, I recommend biopsy of the non mass enhancement area to rule out malignancy, she agrees.  --To reduce her high risk of recurrence, I recommended neoadjuvant and adjuvant chemo T-DM1 (Kadcyla) and Perjeta for a total 1 year treatment. Chemo consent obtained, she will start today  -the goal of care is curative  -Her baseline 11/10/18 ECHO was normal, will monitor every 3 months. If her repeat ECHOs become  abnormal I will refer her to see cardiologist.  -Labs reviewed, CBC and CMP WNL except BG 109. Overall adequate to proceed with Kadcyla and Perjeta today.  -F/u with virtual visit next week   2. 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 S/p adjuvant Xeloda.     3. Genetic Testing  -Given her history of cancer and sister had colon cancer, I recommend genetic testing to rule out inheritable mutations. She is interested.  -Genetic referral previously sent   4. HTN, B/l LE pitting edema  -Recently started on Amlodipine  -She has b/l LE edema with recent onset 2 weeks ago.  -I recommend she elevate her feet when sitting and to use compression socks. I also encouraged her to reduce salt intake.  -BP at 162/67 today (11/17/18)  PLAN:  -MRI guided Left breast biopsy in the next 1-2 weeks  -I called in Zofran today  -Labs reviewed and adequate to proceed with Kadcyla and Perjeta today.  -f/u virtual visit next week -Lab, f/u, Kadcyla/perjeta in 3 weeks     No problem-specific Assessment & Plan notes found for this encounter.   Orders Placed This Encounter  Procedures  . MR LT BREAST BX W LOC DEV 1ST LESION IMAGE BX SPEC MR  GUIDE    Standing Status:   Future    Standing Expiration Date:   01/18/2020    Order Specific Question:   Reason for Exam (SYMPTOM  OR DIAGNOSIS REQUIRED)    Answer:   rule out malignancy    Order Specific Question:   Preferred Imaging Location?    Answer:   [id number] (table limit-550lbs)   All questions were answered. The patient knows to call the clinic with any problems, questions or concerns. No barriers to learning was detected. I spent 25 minutes counseling the patient face to face. The total time spent in the appointment was 30 minutes and more than 50% was on counseling and review of test results     Truitt Merle, MD 11/17/2018   I, Joslyn Devon, am acting as scribe for Truitt Merle, MD.   I have reviewed the above documentation for accuracy and completeness, and I agree with the above.

## 2018-11-10 ENCOUNTER — Other Ambulatory Visit: Payer: Self-pay

## 2018-11-10 ENCOUNTER — Ambulatory Visit (HOSPITAL_COMMUNITY)
Admission: RE | Admit: 2018-11-10 | Discharge: 2018-11-10 | Disposition: A | Discharge status: Home / Self Care | Payer: Medicare Other | Source: Ambulatory Visit | Attending: Hematology | Admitting: Hematology

## 2018-11-10 DIAGNOSIS — Z171 Estrogen receptor negative status [ER-]: Secondary | ICD-10-CM | POA: Insufficient documentation

## 2018-11-10 DIAGNOSIS — I10 Essential (primary) hypertension: Secondary | ICD-10-CM | POA: Diagnosis not present

## 2018-11-10 DIAGNOSIS — I350 Nonrheumatic aortic (valve) stenosis: Secondary | ICD-10-CM | POA: Diagnosis not present

## 2018-11-10 DIAGNOSIS — Z85038 Personal history of other malignant neoplasm of large intestine: Secondary | ICD-10-CM | POA: Diagnosis not present

## 2018-11-10 DIAGNOSIS — C50412 Malignant neoplasm of upper-outer quadrant of left female breast: Secondary | ICD-10-CM

## 2018-11-10 NOTE — Progress Notes (Signed)
  Echocardiogram 2D Echocardiogram has been performed.  Darlina Sicilian M 11/10/2018, 10:28 AM

## 2018-11-13 ENCOUNTER — Telehealth: Payer: Self-pay | Admitting: *Deleted

## 2018-11-13 ENCOUNTER — Inpatient Hospital Stay: Payer: Medicare Other | Attending: Hematology

## 2018-11-13 DIAGNOSIS — C773 Secondary and unspecified malignant neoplasm of axilla and upper limb lymph nodes: Secondary | ICD-10-CM | POA: Insufficient documentation

## 2018-11-13 DIAGNOSIS — R634 Abnormal weight loss: Secondary | ICD-10-CM | POA: Insufficient documentation

## 2018-11-13 DIAGNOSIS — R5383 Other fatigue: Secondary | ICD-10-CM | POA: Insufficient documentation

## 2018-11-13 DIAGNOSIS — I10 Essential (primary) hypertension: Secondary | ICD-10-CM | POA: Insufficient documentation

## 2018-11-13 DIAGNOSIS — R630 Anorexia: Secondary | ICD-10-CM | POA: Insufficient documentation

## 2018-11-13 DIAGNOSIS — E109 Type 1 diabetes mellitus without complications: Secondary | ICD-10-CM | POA: Insufficient documentation

## 2018-11-13 DIAGNOSIS — R590 Localized enlarged lymph nodes: Secondary | ICD-10-CM | POA: Insufficient documentation

## 2018-11-13 DIAGNOSIS — R609 Edema, unspecified: Secondary | ICD-10-CM | POA: Insufficient documentation

## 2018-11-13 DIAGNOSIS — C50412 Malignant neoplasm of upper-outer quadrant of left female breast: Secondary | ICD-10-CM | POA: Insufficient documentation

## 2018-11-13 DIAGNOSIS — Z794 Long term (current) use of insulin: Secondary | ICD-10-CM | POA: Insufficient documentation

## 2018-11-13 DIAGNOSIS — Z85038 Personal history of other malignant neoplasm of large intestine: Secondary | ICD-10-CM | POA: Insufficient documentation

## 2018-11-13 DIAGNOSIS — Z5112 Encounter for antineoplastic immunotherapy: Secondary | ICD-10-CM | POA: Insufficient documentation

## 2018-11-13 DIAGNOSIS — Z171 Estrogen receptor negative status [ER-]: Secondary | ICD-10-CM | POA: Insufficient documentation

## 2018-11-13 DIAGNOSIS — I351 Nonrheumatic aortic (valve) insufficiency: Secondary | ICD-10-CM | POA: Insufficient documentation

## 2018-11-13 DIAGNOSIS — R202 Paresthesia of skin: Secondary | ICD-10-CM | POA: Insufficient documentation

## 2018-11-13 DIAGNOSIS — Z79899 Other long term (current) drug therapy: Secondary | ICD-10-CM | POA: Insufficient documentation

## 2018-11-15 ENCOUNTER — Ambulatory Visit (HOSPITAL_COMMUNITY)
Admission: RE | Admit: 2018-11-15 | Discharge: 2018-11-15 | Disposition: A | Discharge status: Home / Self Care | Payer: Medicare Other | Source: Ambulatory Visit | Attending: Hematology | Admitting: Hematology

## 2018-11-15 ENCOUNTER — Other Ambulatory Visit: Payer: Self-pay

## 2018-11-15 ENCOUNTER — Encounter (HOSPITAL_COMMUNITY): Payer: Medicare Other

## 2018-11-15 ENCOUNTER — Ambulatory Visit (HOSPITAL_COMMUNITY): Payer: Medicare Other

## 2018-11-15 DIAGNOSIS — Z171 Estrogen receptor negative status [ER-]: Secondary | ICD-10-CM | POA: Insufficient documentation

## 2018-11-15 DIAGNOSIS — I10 Essential (primary) hypertension: Secondary | ICD-10-CM | POA: Insufficient documentation

## 2018-11-15 DIAGNOSIS — C50412 Malignant neoplasm of upper-outer quadrant of left female breast: Secondary | ICD-10-CM

## 2018-11-15 DIAGNOSIS — Z85038 Personal history of other malignant neoplasm of large intestine: Secondary | ICD-10-CM | POA: Insufficient documentation

## 2018-11-15 DIAGNOSIS — R590 Localized enlarged lymph nodes: Secondary | ICD-10-CM | POA: Insufficient documentation

## 2018-11-15 DIAGNOSIS — Z79899 Other long term (current) drug therapy: Secondary | ICD-10-CM | POA: Diagnosis not present

## 2018-11-15 LAB — GLUCOSE, CAPILLARY: Glucose-Capillary: 97 mg/dL (ref 70–99)

## 2018-11-15 MED ORDER — GADOBUTROL 1 MMOL/ML IV SOLN
7.5000 mL | Freq: Once | INTRAVENOUS | Status: AC | PRN
  Administered 2018-11-15: 7.5 mL via INTRAVENOUS

## 2018-11-15 MED ORDER — FLUDEOXYGLUCOSE F - 18 (FDG) INJECTION: 6.8000 | Freq: Once | INTRAVENOUS | Status: DC | PRN

## 2018-11-17 ENCOUNTER — Encounter: Payer: Self-pay | Admitting: *Deleted

## 2018-11-17 ENCOUNTER — Inpatient Hospital Stay: Payer: Medicare Other

## 2018-11-17 ENCOUNTER — Telehealth: Payer: Self-pay | Admitting: Hematology

## 2018-11-17 ENCOUNTER — Other Ambulatory Visit: Payer: Self-pay

## 2018-11-17 ENCOUNTER — Inpatient Hospital Stay (HOSPITAL_BASED_OUTPATIENT_CLINIC_OR_DEPARTMENT_OTHER): Payer: Medicare Other | Admitting: Hematology

## 2018-11-17 ENCOUNTER — Encounter: Payer: Self-pay | Admitting: Hematology

## 2018-11-17 VITALS — BP 162/67 | HR 70 | Temp 99.1°F | Resp 20 | Ht 63.0 in | Wt 137.1 lb

## 2018-11-17 DIAGNOSIS — I351 Nonrheumatic aortic (valve) insufficiency: Secondary | ICD-10-CM

## 2018-11-17 DIAGNOSIS — R590 Localized enlarged lymph nodes: Secondary | ICD-10-CM | POA: Diagnosis not present

## 2018-11-17 DIAGNOSIS — E109 Type 1 diabetes mellitus without complications: Secondary | ICD-10-CM | POA: Diagnosis not present

## 2018-11-17 DIAGNOSIS — R202 Paresthesia of skin: Secondary | ICD-10-CM | POA: Diagnosis not present

## 2018-11-17 DIAGNOSIS — Z5112 Encounter for antineoplastic immunotherapy: Secondary | ICD-10-CM

## 2018-11-17 DIAGNOSIS — Z79899 Other long term (current) drug therapy: Secondary | ICD-10-CM

## 2018-11-17 DIAGNOSIS — R609 Edema, unspecified: Secondary | ICD-10-CM | POA: Diagnosis not present

## 2018-11-17 DIAGNOSIS — Z794 Long term (current) use of insulin: Secondary | ICD-10-CM | POA: Diagnosis not present

## 2018-11-17 DIAGNOSIS — Z171 Estrogen receptor negative status [ER-]: Secondary | ICD-10-CM

## 2018-11-17 DIAGNOSIS — I10 Essential (primary) hypertension: Secondary | ICD-10-CM | POA: Diagnosis not present

## 2018-11-17 DIAGNOSIS — Z85038 Personal history of other malignant neoplasm of large intestine: Secondary | ICD-10-CM

## 2018-11-17 DIAGNOSIS — C773 Secondary and unspecified malignant neoplasm of axilla and upper limb lymph nodes: Secondary | ICD-10-CM | POA: Diagnosis not present

## 2018-11-17 DIAGNOSIS — R630 Anorexia: Secondary | ICD-10-CM | POA: Diagnosis not present

## 2018-11-17 DIAGNOSIS — C50412 Malignant neoplasm of upper-outer quadrant of left female breast: Secondary | ICD-10-CM

## 2018-11-17 DIAGNOSIS — R5383 Other fatigue: Secondary | ICD-10-CM | POA: Diagnosis not present

## 2018-11-17 DIAGNOSIS — R634 Abnormal weight loss: Secondary | ICD-10-CM | POA: Diagnosis not present

## 2018-11-17 LAB — CMP (CANCER CENTER ONLY)
ALT: 17 U/L (ref 0–44)
AST: 28 U/L (ref 15–41)
Albumin: 3.9 g/dL (ref 3.5–5.0)
Alkaline Phosphatase: 69 U/L (ref 38–126)
Anion gap: 9 (ref 5–15)
BUN: 19 mg/dL (ref 8–23)
CO2: 22 mmol/L (ref 22–32)
Calcium: 9.1 mg/dL (ref 8.9–10.3)
Chloride: 109 mmol/L (ref 98–111)
Creatinine: 0.8 mg/dL (ref 0.44–1.00)
GFR, Est AFR Am: >60 mL/min (ref >60)
GFR, Estimated: >60 mL/min (ref >60)
Glucose, Bld: 109 mg/dL — ABNORMAL HIGH (ref 70–99)
Potassium: 4.1 mmol/L (ref 3.5–5.1)
Sodium: 140 mmol/L (ref 135–145)
Total Bilirubin: 0.4 mg/dL (ref 0.3–1.2)
Total Protein: 7.2 g/dL (ref 6.5–8.1)

## 2018-11-17 LAB — CBC WITH DIFFERENTIAL (CANCER CENTER ONLY)
Abs Immature Granulocytes: 0.01 10*3/uL (ref 0.00–0.07)
Basophils Absolute: 0 10*3/uL (ref 0.0–0.1)
Basophils Relative: 1 %
Eosinophils Absolute: 0.1 10*3/uL (ref 0.0–0.5)
Eosinophils Relative: 1 %
HCT: 40.8 % (ref 36.0–46.0)
Hemoglobin: 13.8 g/dL (ref 12.0–15.0)
Immature Granulocytes: 0 %
Lymphocytes Relative: 33 %
Lymphs Abs: 1.5 10*3/uL (ref 0.7–4.0)
MCH: 31.7 pg (ref 26.0–34.0)
MCHC: 33.8 g/dL (ref 30.0–36.0)
MCV: 93.8 fL (ref 80.0–100.0)
Monocytes Absolute: 0.5 10*3/uL (ref 0.1–1.0)
Monocytes Relative: 11 %
Neutro Abs: 2.4 10*3/uL (ref 1.7–7.7)
Neutrophils Relative %: 54 %
Platelet Count: 172 10*3/uL (ref 150–400)
RBC: 4.35 MIL/uL (ref 3.87–5.11)
RDW: 12.5 % (ref 11.5–15.5)
WBC Count: 4.5 10*3/uL (ref 4.0–10.5)
nRBC: 0 % (ref 0.0–0.2)

## 2018-11-17 MED ORDER — ACETAMINOPHEN 325 MG PO TABS
ORAL_TABLET | ORAL | Status: AC
  Filled 2018-11-17: qty 2

## 2018-11-17 MED ORDER — SODIUM CHLORIDE 0.9 % IV SOLN
3.6000 mg/kg | Freq: Once | INTRAVENOUS | Status: AC
  Administered 2018-11-17: 11:00:00 220 mg via INTRAVENOUS
  Filled 2018-11-17: qty 5

## 2018-11-17 MED ORDER — ONDANSETRON HCL 8 MG PO TABS: 8.0000 mg | ORAL_TABLET | Freq: Three times a day (TID) | ORAL | 2 refills | Status: DC | PRN

## 2018-11-17 MED ORDER — SODIUM CHLORIDE 0.9 % IV SOLN
840.0000 mg | Freq: Once | INTRAVENOUS | Status: AC
  Administered 2018-11-17: 14:00:00 840 mg via INTRAVENOUS
  Filled 2018-11-17: qty 28

## 2018-11-17 MED ORDER — SODIUM CHLORIDE 0.9 % IV SOLN
Freq: Once | INTRAVENOUS | Status: AC
  Administered 2018-11-17: 09:00:00 via INTRAVENOUS
  Filled 2018-11-17: qty 250

## 2018-11-17 MED ORDER — DIPHENHYDRAMINE HCL 25 MG PO CAPS
ORAL_CAPSULE | ORAL | Status: AC
  Filled 2018-11-17: qty 2

## 2018-11-17 MED ORDER — ACETAMINOPHEN 325 MG PO TABS
650.0000 mg | ORAL_TABLET | Freq: Once | ORAL | Status: AC
  Administered 2018-11-17: 10:00:00 650 mg via ORAL

## 2018-11-17 MED ORDER — DIPHENHYDRAMINE HCL 25 MG PO CAPS
50.0000 mg | ORAL_CAPSULE | Freq: Once | ORAL | Status: AC
  Administered 2018-11-17: 50 mg via ORAL

## 2018-11-17 NOTE — Patient Instructions (Addendum)
[facility] Discharge Instructions for Patients Receiving Chemotherapy  Today you received the following immunotherapy agents:  Kadcyla & Perjeta  To help prevent nausea and vomiting after your treatment, we encourage you to take your nausea medication as needed. If you develop nausea and vomiting that is not controlled by your nausea medication, call the clinic.   BELOW ARE SYMPTOMS THAT SHOULD BE REPORTED IMMEDIATELY:  *FEVER GREATER THAN 100.5 F  *CHILLS WITH OR WITHOUT FEVER  NAUSEA AND VOMITING THAT IS NOT CONTROLLED WITH YOUR NAUSEA MEDICATION  *UNUSUAL SHORTNESS OF BREATH  *UNUSUAL BRUISING OR BLEEDING  TENDERNESS IN MOUTH AND THROAT WITH OR WITHOUT PRESENCE OF ULCERS  *URINARY PROBLEMS  *BOWEL PROBLEMS  UNUSUAL RASH Items with * indicate a potential emergency and should be followed up as soon as possible.  Feel free to call the clinic should you have any questions or concerns. The clinic phone number is [phone].  Please show the [facility] at check-in to the Emergency Department and triage nurse.  Pertuzumab injection What is this medicine? PERTUZUMAB (per TOOZ ue mab) is a monoclonal antibody. It is used to treat breast cancer. This medicine may be used for other purposes; ask your health care provider or pharmacist if you have questions. COMMON BRAND NAME(S): PERJETA What should I tell my health care provider before I take this medicine? They need to know if you have any of these conditions:  heart disease  heart failure  high blood pressure  history of irregular heart beat  recent or ongoing radiation therapy  an unusual or allergic reaction to pertuzumab, other medicines, foods, dyes, or preservatives  pregnant or trying to get pregnant  breast-feeding How should I use this medicine? This medicine is for infusion into a vein. It is given by a health care professional in a hospital or clinic setting. Talk to your  pediatrician regarding the use of this medicine in children. Special care may be needed. Overdosage: If you think you have taken too much of this medicine contact a poison control center or emergency room at once. NOTE: This medicine is only for you. Do not share this medicine with others. What if I miss a dose? It is important not to miss your dose. Call your doctor or health care professional if you are unable to keep an appointment. What may interact with this medicine? Interactions are not expected. Give your health care provider a list of all the medicines, herbs, non-prescription drugs, or dietary supplements you use. Also tell them if you smoke, drink alcohol, or use illegal drugs. Some items may interact with your medicine. This list may not describe all possible interactions. Give your health care provider a list of all the medicines, herbs, non-prescription drugs, or dietary supplements you use. Also tell them if you smoke, drink alcohol, or use illegal drugs. Some items may interact with your medicine. What should I watch for while using this medicine? Your condition will be monitored carefully while you are receiving this medicine. Report any side effects. Continue your course of treatment even though you feel ill unless your doctor tells you to stop. Do not become pregnant while taking this medicine or for 7 months after stopping it. Women should inform their doctor if they wish to become pregnant or think they might be pregnant. Women of child-bearing potential will need to have a negative pregnancy test before starting this medicine. There is a potential for serious side effects to an unborn child. Talk  to your health care professional or pharmacist for more information. Do not breast-feed an infant while taking this medicine or for 7 months after stopping it. Women must use effective birth control with this medicine. Call your doctor or health care professional for advice if you get a  fever, chills or sore throat, or other symptoms of a cold or flu. Do not treat yourself. Try to avoid being around people who are sick. You may experience fever, chills, and headache during the infusion. Report any side effects during the infusion to your health care professional. What side effects may I notice from receiving this medicine? Side effects that you should report to your doctor or health care professional as soon as possible:  breathing problems  chest pain or palpitations  dizziness  feeling faint or lightheaded  fever or chills  skin rash, itching or hives  sore throat  swelling of the face, lips, or tongue  swelling of the legs or ankles  unusually weak or tired Side effects that usually do not require medical attention (report to your doctor or health care professional if they continue or are bothersome):  diarrhea  hair loss  nausea, vomiting  tiredness This list may not describe all possible side effects. Call your doctor for medical advice about side effects. You may report side effects to FDA at 1-800-FDA-1088. Where should I keep my medicine? This drug is given in a hospital or clinic and will not be stored at home. NOTE: This sheet is a summary. It may not cover all possible information. If you have questions about this medicine, talk to your doctor, pharmacist, or health care provider.  2020 Elsevier/Gold Standard (2015-05-29 12:08:50)          What is this medicine? ADO-TRASTUZUMAB EMTANSINE (ADD oh traz TOO zuh mab em TAN zine) is a monoclonal antibody combined with chemotherapy. It is used to treat breast cancer. This medicine may be used for other purposes; ask your health care provider or pharmacist if you have questions. COMMON BRAND NAME(S): Kadcyla What should I tell my health care provider before I take this medicine? They need to know if you have any of these conditions:  heart disease  heart failure  infection (especially a  virus infection such as chickenpox, cold sores, or herpes)  liver disease  lung or breathing disease, like asthma  tingling of the fingers or toes, or other nerve disorder  an unusual or allergic reaction to ado-trastuzumab emtansine, other medications, foods, dyes, or preservatives  pregnant or trying to get pregnant  breast-feeding How should I use this medicine? This medicine is for infusion into a vein. It is given by a health care professional in a hospital or clinic setting. Talk to your pediatrician regarding the use of this medicine in children. Special care may be needed. Overdosage: If you think you have taken too much of this medicine contact a poison control center or emergency room at once. NOTE: This medicine is only for you. Do not share this medicine with others. What if I miss a dose? It is important not to miss your dose. Call your doctor or health care professional if you are unable to keep an appointment. What may interact with this medicine? This medicine may also interact with the following medications:  atazanavir  boceprevir  clarithromycin  delavirdine  indinavir  dalfopristin; quinupristin  isoniazid, INH  itraconazole  ketoconazole  nefazodone  nelfinavir  ritonavir  telaprevir  telithromycin  tipranavir  voriconazole This list  may not describe all possible interactions. Give your health care provider a list of all the medicines, herbs, non-prescription drugs, or dietary supplements you use. Also tell them if you smoke, drink alcohol, or use illegal drugs. Some items may interact with your medicine. What should I watch for while using this medicine? Visit your doctor for checks on your progress. This drug may make you feel generally unwell. This is not uncommon, as chemotherapy can affect healthy cells as well as cancer cells. Report any side effects. Continue your course of treatment even though you feel ill unless your doctor tells  you to stop. You may need blood work done while you are taking this medicine. Call your doctor or health care professional for advice if you get a fever, chills or sore throat, or other symptoms of a cold or flu. Do not treat yourself. This drug decreases your body's ability to fight infections. Try to avoid being around people who are sick. Be careful brushing and flossing your teeth or using a toothpick because you may get an infection or bleed more easily. If you have any dental work done, tell your dentist you are receiving this medicine. Avoid taking products that contain aspirin, acetaminophen, ibuprofen, naproxen, or ketoprofen unless instructed by your doctor. These medicines may hide a fever. Do not become pregnant while taking this medicine or for 7 months after stopping it, men with female partners should use contraception during treatment and for 4 months after the last dose. Women should inform their doctor if they wish to become pregnant or think they might be pregnant. There is a potential for serious side effects to an unborn child. Do not breast-feed an infant while taking this medicine or for 7 months after the last dose. Men who have a partner who is pregnant or who is capable of becoming pregnant should use a condom during sexual activity while taking this medicine and for 4 months after stopping it. Men should inform their doctors if they wish to father a child. This medicine may lower sperm counts. Talk to your health care professional or pharmacist for more information. What side effects may I notice from receiving this medicine? Side effects that you should report to your doctor or health care professional as soon as possible:  allergic reactions like skin rash, itching or hives, swelling of the face, lips, or tongue  breathing problems  chest pain or palpitations  fever or chills, sore throat  general ill feeling or flu-like symptoms  light-colored stools  nausea,  vomiting  pain, tingling, numbness in the hands or feet  signs and symptoms of bleeding such as bloody or black, tarry stools; red or dark-brown urine; spitting up blood or brown material that looks like coffee grounds; red spots on the skin; unusual bruising or bleeding from the eye, gums, or nose  swelling of the legs or ankles  yellowing of the eyes or skin Side effects that usually do not require medical attention (report to your doctor or health care professional if they continue or are bothersome):  changes in taste  constipation  dizziness  headache  joint pain  muscle pain  trouble sleeping  unusually weak or tired This list may not describe all possible side effects. Call your doctor for medical advice about side effects. You may report side effects to FDA at 1-800-FDA-1088. Where should I keep my medicine? This drug is given in a hospital or clinic and will not be stored at home. NOTE: This sheet  is a summary. It may not cover all possible information. If you have questions about this medicine, talk to your doctor, pharmacist, or health care provider.  2020 Elsevier/Gold Standard (2017-09-23 10:03:15)

## 2018-11-17 NOTE — Telephone Encounter (Signed)
Scheduled appt per 7/10 los.   Infusion nurse will let the patient know her appt is a phone visit only on 7/16.

## 2018-11-20 ENCOUNTER — Other Ambulatory Visit: Payer: Self-pay | Admitting: Hematology

## 2018-11-20 DIAGNOSIS — C50412 Malignant neoplasm of upper-outer quadrant of left female breast: Secondary | ICD-10-CM

## 2018-11-20 NOTE — Progress Notes (Signed)
[place]   Telephone:(336) [phone] Fax:(336) [phone]   Clinic Follow up Note   Patient Care Team: [name], MD as PCP - General (Family Medicine)   I connected with Jasmin Lewis on 11/23/2018 at  1:00 PM EDT by telephone visit and verified that I am speaking with the correct person using two identifiers.  I discussed the limitations, risks, security and privacy concerns of performing an evaluation and management service by telephone and the availability of in person appointments. I also discussed with the patient that there may be a patient responsible charge related to this service. The patient expressed understanding and agreed to proceed.   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 Findings were a 5.9 x 3.8 x 1.2 cm moderate to well-differentiated adenocarcinoma penetrating the muscular wall. Forty lymph nodes negative. No vascular or lymphatic invasion. Multiple diverticula with microabscess formation and inflammation. Carcinoma present in at least 1 diverticulum. Preop CEA 5.2 with lab normal range 0 to 2.5. Preop CT scan with no obvious additional pathology.    2007 -  Chemotherapy   She received oral Xeloda chemotherapy as an adjuvant. She declined treatment on a clinical trial.    11/12/2011 Initial Diagnosis   [name], stage II   12/09/2011 Procedure   Followup colonoscopy done on 12/09/2011. She was found to have 2 polyps which were removed. Chronic diverticulosis.     Malignant neoplasm of upper-outer quadrant of left breast in female, estrogen receptor negative ([place])  09/26/2018 Mammogram   Mammogram/US of left breast 09/26/18  IMPRESSION:  1. 2.9cm irregular mass in the upper outer left breast corresponds to the palpable abnormality. This is highly suspicious for breast carcinoma.  2. Two adjacent abnormal left axillary  Lymph nodes suspicious for metastatic adenopathy. There is a third borderline abnormal left axillary LN with a cortex thickened to 58m.  3. Benign right breast cyst. No evidence of right breast malignancy.    10/11/2018 Cancer Staging   Staging form: Breast, AJCC 8th Edition - Clinical stage from 10/11/2018: Stage IIB (cT2, cN1, cM0, G3, ER-, PR-, HER2+) - Signed by [name] MD on 11/02/2018   10/11/2018 Initial Biopsy   Diagnosis 10/11/18 1. Breast, left, needle core biopsy, upper outer left 2 o'clock - INVASIVE DUCTAL CARCINOMA. - DUCTAL CARCINOMA IN SITU. - LYMPHOVASCULAR INVASION IS IDENTIFIED. - SEE COMMENT. 2. Lymph node, needle/core biopsy, left axilla - INVASIVE DUCTAL CARCINOMA. - SEE COMMENT.   10/11/2018 Receptors her2   The tumor cells are POSITIVE for Her2 (3+). Estrogen Receptor: 0%, NEGATIVE Progesterone Receptor: 0%, NEGATIVE Proliferation Marker Ki67: 70%   11/02/2018 Initial Diagnosis   Malignant neoplasm of upper-outer quadrant of left breast in female, estrogen receptor negative [place]   11/15/2018 Breast MRI   MRI breast 11/15/18  IMPRESSION: 1. 3.3 centimeter mass in the LATERAL portion of the LEFT breast consistent with known malignancy. 2. There is significant non mass enhancement surrounding this mass and extending anteriorly into the nipple base, with largest diameter in the anterior to posterior axis, measuring 7.2 centimeters. 3. If the patient would consider breast conservation, additional MR guided core biopsies are recommended. Consider biopsy of the  inferior and anterior extent of the non mass enhancement to document extent of disease. 4. Three enlarged LEFT axillary lymph nodes. 5. RIGHT breast is negative.   11/15/2018 PET scan   PET 11/15/18 IMPRESSION:  Hypermetabolic left breast lesion compatible with known primary. Hypermetabolic left axillary lymph nodes are consistent with metastatic disease.   No evidence for additional hypermetabolic metastatic involvement in the neck, chest, abdomen, or pelvis.   11/17/2018 -  Chemotherapy   Neo-adjuvant Kadcyla and perjeta q3weeks for 6 cycles starting 11/17/18, will continue Perjeta for 1 year total treatment.       CURRENT THERAPY:  Kadcylaand perjetaq3weeks starting 11/17/18  INTERVAL HISTORY:  Jasmin Lewis is here for a follow up. She notes after her first cycle Kadcyla she felt cold. She felt well for 2-3 days afterward. However, the last few days she lost her appetite and lost 7 pounds. She has increased liquid intake and still eating. She denies diarrhea and nausea but notes mild constiaptoin from surgery. She denie [place] or SOB.     REVIEW OF SYSTEMS:   Constitutional: Denies fevers, chills  (+) lower apetite, 7 pound weight loss Eyes: Denies blurriness of vision Ears, nose, mouth, throat, and face: Denies mucositis or sore throat Respiratory: Denies cough, dyspnea or wheezes Cardiovascular: Denies palpitation, chest discomfort or lower extremity swelling Gastrointestinal:  Denies nausea, heartburn (+) constipation Skin: Denies abnormal skin rashes Lymphatics: Denies new lymphadenopathy or easy bruising Neurological:Denies numbness, tingling or new weaknesses Behavioral/Psych: Mood is stable, no new changes  All other systems were reviewed with the patient and are negative.  MEDICAL HISTORY:  Past Medical History:  Diagnosis Date  . [name], stage II 11/12/2011   Sigmoid lesion 5.9 cm  40 nodes negative but focus of cancer in a diverticum   Pre-op CEA 5.2 with lab normal up to 2.5 resected 09/21/05   Xeloda adjuvant chemotherapy  . Hypertension     SURGICAL HISTORY: Past Surgical History:  Procedure Laterality Date  . HEMICOLECTOMY  2007    I have reviewed  the social history and family history with the patient and they are unchanged from previous note.  ALLERGIES:  has No Known Allergies.  MEDICATIONS:  Current Outpatient Medications  Medication Sig Dispense Refill  . amLODipine (NORVASC) 5 MG tablet Take by mouth.    . calcium citrate-vitamin D (CITRACAL+D) 315-200 MG-UNIT per tablet Take 1 tablet by mouth daily.    . fish oil-omega-3 fatty acids 1000 MG capsule Take 1 g by mouth daily.    . ondansetron (ZOFRAN) 8 MG tablet Take 1 tablet (8 mg total) by mouth every 8 (eight) hours as needed for nausea or vomiting. 20 tablet 2  . Zoster Vaccine Adjuvanted [facility]) injection 0.5 mL IM x 1 then repeat in 2 months     No current facility-administered medications for this visit.     PHYSICAL EXAMINATION: ECOG PERFORMANCE STATUS: 1 - Symptomatic but completely ambulatory  No vitals taken today, Exam not performed today   LABORATORY DATA:  I have reviewed the data as listed CBC Latest Ref Rng & Units 11/17/2018 11/02/2018 11/13/2012  WBC 4.0 - 10.5 K/uL 4.5 6.3 5.7  Hemoglobin 12.0 - 15.0 g/dL 13.8 14.7 14.0  Hematocrit 36.0 - 46.0 % 40.8 44.4 41.1  Platelets 150 - 400 K/uL 172 185 175     CMP Latest Ref Rng & Units 11/17/2018 11/02/2018 11/13/2012  Glucose 70 - 99 mg/dL 109(H) 92 90  BUN 8 - 23 mg/dL  19 18 18.3  Creatinine 0.44 - 1.00 mg/dL 0.80 0.90 0.8  Sodium 135 - 145 mmol/L 140 140 137  Potassium 3.5 - 5.1 mmol/L 4.1 4.2 4.5  Chloride 98 - 111 mmol/L 109 106 -  CO2 22 - 32 mmol/L _0 Calcium 8.9 - 10.3 mg/dL 9.1 9.6 9.7  Total Protein 6.5 - 8.1 g/dL 7.2 7.8 7.4  Total Bilirubin 0.3 - 1.2 mg/dL 0.4 0.4 0.54  Alkaline Phos 38 - 126 U/L 69 69 77  AST 15 - 41 U/L _1 ALT 0 - 44 U/L _2 RADIOGRAPHIC STUDIES: I have personally reviewed the radiological images as listed and agreed with the findings in the report. No results found.   ASSESSMENT & PLAN:  Jasmin Lewis is a 85 y.o. 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 Shepresented with a palpable left breast massandwas found to have invasive ductal carcinomawith left axillary node metastasis. 2 abnormal LNs were seen on [place], 1 was biopsied.Her tumor is negative for ER and PR, HER2 positive, which is more aggressive -staging PET was negative for distant metastasis -she is scheduled for MRI guided breast biopsy 11/29/18 for non-mass enhancement.  -I started her on neoadjuvant and adjuvant chemo T-DM1 (Kadcyla) and Perjetafor a total 1 year treatment on 11/17/18.  -She is toleraing cycle 1 so far moderately well. She initally was doing well but she had loss of appetite and 7 pound weight loss with fatigue the last few days.  -dietician referral, I encourage her to take nutritional supplement -Will f/u in 2 weeks with cycle 2.   2. Low appetite, weight loss -S/p cycle 1 she has lost 7 pounds.  -I will set up dietician consult. She is agreeable  -I enocuraged her to increase her calorie and protien intake. She can use ensure boost to help if she cannot do so with food intake. I also encouraged her to drink enough water and liquids to stay hydrated.  -I encouraged her to use her 2 off weeks of treatment to recover and gain weight back.  -Will monitor.   3. H/o stage IIadenocarcinoma of the sigmoid colon diagnosed in May 2007 -S/p surgicalresectionon 09/21/05.S/p adjuvant Xeloda.   4. Genetic Testing  -Given her history of cancer and sister had colon cancer, I recommend genetic testing to rule out inheritable mutations. She is interested.  -Genetic referral previously sent   5. HTN, B/lLEpitting edema  -Recently started on Amlodipine  -She has b/l LE edema with recent onset 2 weeks ago.  -I recommend she elevate her feet when sitting and to use compression socks. I also  encouraged her to reduce salt intake.  PLAN: -MRI guided Left breast biopsy on 11/29/18 -Set up Dietician consult -Lab, f/u, Kadcyla/perjeta in 2 weeks     No problem-specific Assessment & Plan notes found for this encounter.   [name].  I discussed the assessment and treatment plan with the patient. The patient was provided an opportunity to ask questions and all were answered. The patient agreed with the plan and demonstrated an understanding of the instructions.  The patient was advised to call back or seek an in-person evaluation if the symptoms worsen or if the condition fails to improve as anticipated.  I provided 10 minutes of non face-to-face  telephone visit time during this encounter, and > 50% was spent counseling as documented under my assessment & plan.    Truitt Merle, MD 11/23/2018   I, Joslyn Devon, am acting as scribe for Truitt Merle, MD.   I have reviewed the above documentation for accuracy and completeness, and I agree with the above.

## 2018-11-22 ENCOUNTER — Telehealth: Payer: Self-pay | Admitting: Hematology

## 2018-11-22 NOTE — Telephone Encounter (Signed)
Contacted patient to verify phone visit for pre reg °

## 2018-11-23 ENCOUNTER — Inpatient Hospital Stay (HOSPITAL_BASED_OUTPATIENT_CLINIC_OR_DEPARTMENT_OTHER): Payer: Medicare Other | Admitting: Hematology

## 2018-11-23 ENCOUNTER — Encounter: Payer: Self-pay | Admitting: Hematology

## 2018-11-23 DIAGNOSIS — Z79899 Other long term (current) drug therapy: Secondary | ICD-10-CM

## 2018-11-23 DIAGNOSIS — Z9221 Personal history of antineoplastic chemotherapy: Secondary | ICD-10-CM

## 2018-11-23 DIAGNOSIS — C50412 Malignant neoplasm of upper-outer quadrant of left female breast: Secondary | ICD-10-CM | POA: Diagnosis not present

## 2018-11-23 DIAGNOSIS — Z171 Estrogen receptor negative status [ER-]: Secondary | ICD-10-CM | POA: Diagnosis not present

## 2018-11-24 ENCOUNTER — Ambulatory Visit: Payer: Medicare Other | Admitting: Nutrition

## 2018-11-24 ENCOUNTER — Telehealth: Payer: Self-pay | Admitting: Hematology

## 2018-11-24 NOTE — Telephone Encounter (Signed)
No los per 7/16. °

## 2018-11-24 NOTE — Progress Notes (Signed)
RD working remotely.  85 year old female diagnosed with Breast Cancer, ER, PR negative receiving Kadcyla and Perjeta.  PMH includes HTN, Stage 2 colon cancer, Diverticulosis.  Labs include Glucose 109.  Height: 5'3" Weight: 137.1 pounds. Weighed 146.9 pounds on July 5. ~7% wt loss in 2.5 weeks which is significant.  Patient appreciates RD phone call. Reports she is eating better now that she has smaller amounts of food more often.  She is trying to drink more water but finds it difficult. States her edema is better and was because of hypertensive medications. She loves milk and eats a variety of fish, chicken, eggs and vegetables.  Nutrition Diagnosis: Unintended weight loss related to inadequate oral intake as evidenced by 9.8 pounds loss since July 1.  Intervention: Educated on strategies to increase calories and protein in small frequent meals and snacks. Limit high sodium foods. Increase water throughout the day;educated on other fluids which will contribute to hydration. Recommended patient try [employer] mixed with 2% milk. Questions answered and teach back method used.  Monitoring, Evaluation, Goals: Patient will consume increase calories, protein, and fluids to minimize wt loss.  Next Visit: Patient will contact me with questions or concerns.

## 2018-11-29 ENCOUNTER — Ambulatory Visit
Admission: RE | Admit: 2018-11-29 | Discharge: 2018-11-29 | Disposition: A | Discharge status: Home / Self Care | Payer: Medicare Other | Source: Ambulatory Visit | Attending: Hematology | Admitting: Hematology

## 2018-11-29 ENCOUNTER — Other Ambulatory Visit: Payer: Self-pay | Admitting: Diagnostic Radiology

## 2018-11-29 ENCOUNTER — Other Ambulatory Visit: Payer: Self-pay

## 2018-11-29 DIAGNOSIS — C50412 Malignant neoplasm of upper-outer quadrant of left female breast: Secondary | ICD-10-CM

## 2018-11-29 DIAGNOSIS — Z171 Estrogen receptor negative status [ER-]: Secondary | ICD-10-CM

## 2018-11-29 MED ORDER — GADOBUTROL 1 MMOL/ML IV SOLN
6.0000 mL | Freq: Once | INTRAVENOUS | Status: AC | PRN
  Administered 2018-11-29: 6 mL via INTRAVENOUS

## 2018-12-04 NOTE — Progress Notes (Signed)
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 Findings were a 5.9 x 3.8 x 1.2 cm moderate to well-differentiated adenocarcinoma penetrating the muscular wall. Forty lymph nodes negative. No vascular or lymphatic invasion. Multiple diverticula with microabscess formation and inflammation. Carcinoma present in at least 1 diverticulum. Preop CEA 5.2 with lab normal range 0 to 2.5. Preop CT scan with no obvious additional pathology.    2007 -  Chemotherapy   She received oral Xeloda chemotherapy as an adjuvant. She declined treatment on a clinical trial.    11/12/2011 Initial Diagnosis   [name], stage II   12/09/2011 Procedure   Followup colonoscopy done on 12/09/2011. She was found to have 2 polyps which were removed. Chronic diverticulosis.     Malignant neoplasm of upper-outer quadrant of left breast in female, estrogen receptor negative ([place])  09/26/2018 Mammogram   Mammogram/US of left breast 09/26/18 IMPRESSION:  1. 2.9cm irregular mass in the upper outer left breast corresponds to the palpable abnormality. This is highly suspicious for breast carcinoma.  2. Two adjacent abnormal left axillary  Lymph nodes suspicious for metastatic adenopathy. There is a third borderline abnormal left axillary LN with a cortex thickened to 42m.  3. Benign right breast cyst. No evidence of right breast malignancy.    10/11/2018 Cancer Staging   Staging form: Breast, AJCC 8th Edition - Clinical stage from 10/11/2018: Stage IIB (cT2, cN1, cM0, G3, ER-, PR-,  HER2+) - Signed by [name] MD on 11/02/2018   10/11/2018 Initial Biopsy   Diagnosis 10/11/18 1. Breast, left, needle core biopsy, upper outer left 2 o'clock - INVASIVE DUCTAL CARCINOMA. - DUCTAL CARCINOMA IN SITU. - LYMPHOVASCULAR INVASION IS IDENTIFIED. - SEE COMMENT. 2. Lymph node, needle/core biopsy, left axilla - INVASIVE DUCTAL CARCINOMA. - SEE COMMENT.   10/11/2018 Receptors her2   The tumor cells are POSITIVE for Her2 (3+). Estrogen Receptor: 0%, NEGATIVE Progesterone Receptor: 0%, NEGATIVE Proliferation Marker Ki67: 70%   11/02/2018 Initial Diagnosis   Malignant neoplasm of upper-outer quadrant of left breast in female, estrogen receptor negative ([place]   11/15/2018 Breast MRI   MRI breast 11/15/18  IMPRESSION: 1. 3.3 centimeter mass in the LATERAL portion of the LEFT breast consistent with known malignancy. 2. There is significant non mass enhancement surrounding this mass and extending anteriorly into the nipple base, with largest diameter in the anterior to posterior axis, measuring 7.2 centimeters. 3. If the patient would consider breast conservation, additional MR guided core biopsies are recommended. Consider biopsy of the inferior and anterior extent of the non mass enhancement to document extent of disease. 4. Three enlarged LEFT axillary lymph nodes. 5. RIGHT breast is negative.   11/15/2018 PET scan   PET 11/15/18 IMPRESSION: Hypermetabolic left breast lesion compatible with known primary. Hypermetabolic left axillary lymph nodes are consistent with metastatic disease.   No evidence for additional hypermetabolic metastatic involvement in the neck, chest, abdomen, or pelvis.   11/17/2018 -  Chemotherapy   Neo-adjuvant Kadcyla and perjeta q3weeks for 6 cycles starting 11/17/18, will continue Perjeta  for 1 year total treatment.    11/29/2018 Pathology Results   Diagnosis 1. Breast, left, needle core biopsy, inferior anterior (cylinder clip) - INVASIVE DUCTAL  CARCINOMA. - DUCTAL CARCINOMA IN SITU. - LYMPHOVASCULAR INVASION IS IDENTIFIED. - SEE COMMENT. 2. Breast, left, needle core biopsy, central posterior (barbell clip) - INVASIVE DUCTAL CARCINOMA. - LYMPHOVASCULAR INVASION IS IDENTIFIED.      CURRENT THERAPY:  Kadcylaand perjetaq3weeks starting 11/17/18  INTERVAL HISTORY:  RONI FRIBERG is here for a follow up and treatment. She presents to the clinic alone. She notes in labs this morning she kept getting stuck to access her. It took about 3 times and it was painful. This was the first time that happened. She notes no issues with vein access for infusion.   She notes after her first infusion she tolerated well mostly with manageable fatigue. She notes feeling breast change ans tingling of her nipple this morning.    REVIEW OF SYSTEMS:   Constitutional: Denies fevers, chills or abnormal weight loss Eyes: Denies blurriness of vision Ears, nose, mouth, throat, and face: Denies mucositis or sore throat Respiratory: Denies cough, dyspnea or wheezes Cardiovascular: Denies palpitation, chest discomfort or lower extremity swelling Gastrointestinal:  Denies nausea, heartburn or change in bowel habits Skin: Denies abnormal skin rashes Lymphatics: Denies new lymphadenopathy or easy bruising Neurological:Denies numbness, tingling or new weaknesses Behavioral/Psych: Mood is stable, no new changes  All other systems were reviewed with the patient and are negative.  MEDICAL HISTORY:  Past Medical History:  Diagnosis Date  . [name], stage II 11/12/2011   Sigmoid lesion 5.9 cm  40 nodes negative but focus of cancer in a diverticum   Pre-op CEA 5.2 with lab normal up to 2.5 resected 09/21/05   Xeloda adjuvant chemotherapy  . Hypertension     SURGICAL HISTORY: Past Surgical History:  Procedure Laterality Date  . HEMICOLECTOMY  2007    I have reviewed the social history and family history with the patient and they are unchanged  from previous note.  ALLERGIES:  has No Known Allergies.  MEDICATIONS:  Current Outpatient Medications  Medication Sig Dispense Refill  . amLODipine (NORVASC) 5 MG tablet Take by mouth.    . calcium citrate-vitamin D (CITRACAL+D) 315-200 MG-UNIT per tablet Take 1 tablet by mouth daily.    . fish oil-omega-3 fatty acids 1000 MG capsule Take 1 g by mouth daily.    . ondansetron (ZOFRAN) 8 MG tablet Take 1 tablet (8 mg total) by mouth every 8 (eight) hours as needed for nausea or vomiting. 20 tablet 2   No current facility-administered medications for this visit.     PHYSICAL EXAMINATION: ECOG PERFORMANCE STATUS: 0 - Asymptomatic  Vitals:   12/08/18 0842  BP: (!) 150/64  Pulse: 76  Resp: 17  Temp: 98.7 F (37.1 C)  SpO2: 96%   Filed Weights   12/08/18 0842  Weight: 135 lb (61.2 kg)    GENERAL:alert, no distress and comfortable SKIN: skin color, texture, turgor are normal, no rashes or significant lesions EYES: normal, Conjunctiva are pink and non-injected, sclera clear  NECK: supple, thyroid normal size, non-tender, without nodularity LYMPH:  no palpable lymphadenopathy in the cervical, axillary  LUNGS: clear to auscultation and percussion with normal breathing effort HEART: regular rate & rhythm and no murmurs and no lower extremity edema ABDOMEN:abdomen soft, non-tender and normal bowel sounds Musculoskeletal:no cyanosis of digits and no clubbing  NEURO: alert & oriented x 3 with fluent speech, no focal  motor/sensory deficits BREAST: (+) Skin ecchymosis at [facility] left breast (+) She now has fullness at 2:00 position, no longer a palpable mass of left breast    LABORATORY DATA:  I have reviewed the data as listed CBC Latest Ref Rng & Units 12/08/2018 11/17/2018 11/02/2018  WBC 4.0 - 10.5 K/uL 5.6 4.5 6.3  Hemoglobin 12.0 - 15.0 g/dL 13.2 13.8 14.7  Hematocrit 36.0 - 46.0 % 39.6 40.8 44.4  Platelets 150 - 400 K/uL 213 172 185     CMP Latest Ref Rng & Units 12/08/2018  11/17/2018 11/02/2018  Glucose 70 - 99 mg/dL 97 109(H) 92  BUN 8 - 23 mg/dL 21 19 18   Creatinine 0.44 - 1.00 mg/dL 0.80 0.80 0.90  Sodium 135 - 145 mmol/L 139 140 140  Potassium 3.5 - 5.1 mmol/L 4.0 4.1 4.2  Chloride 98 - 111 mmol/L 106 109 106  CO2 22 - 32 mmol/L 26 22 25   Calcium 8.9 - 10.3 mg/dL 9.3 9.1 9.6  Total Protein 6.5 - 8.1 g/dL 7.3 7.2 7.8  Total Bilirubin 0.3 - 1.2 mg/dL 0.5 0.4 0.4  Alkaline Phos 38 - 126 U/L 80 69 69  AST 15 - 41 U/L 41 28 30  ALT 0 - 44 U/L 38 17 21      RADIOGRAPHIC STUDIES: I have personally reviewed the radiological images as listed and agreed with the findings in the report. No results found.   ASSESSMENT & PLAN:  TAMMY WICKLIFFE is a 85 y.o. 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 2 abnormal LNs were seen on [place], 1 was biopsied.Her tumor is negative for ER and PR, HER2 positive, which is more aggressive -staging PET was negative for distant metastasis -I started her on neoadjuvant andadjuvant chemo T-DM1 (Kadcyla) and Perjetafor a total 1 year treatment on 11/17/18. She tolerated moderately well with loss of appetite and mild weight loss with fatigue. -Her 11/29/18 left breast biopsy results shows components of invasive ductal carcinoma and DCIS. I discussed with her.  -I discussed her chemo treatment plan will remain the same, but may change her breast surgery to mastectomy. This will be determined to her response to neoadjuvant chemo. She understands.  -Her breast exam today showed s/p cycle 1 her left breast mass is now not palpable, with only residual soft tissue fullness.   -plan 4-6 cycle neoadjuvant Kadcyla. Will repeat breast scan after 4 cycles to determine if  she can stop treatment earlier and proceed with surgery sooner.  -Labs reviewed, CBC and CMP WNL. Ca 27.29 still pending. Overall adequate to proceed with Cycle 2 Kadcyla and Perjeta today.  -I discussed the option of PAC placement given her trouble with peripheral access. She will think about it.  -F/u in 3 weeks   2. Low appetite, weight loss -S/p cycle 1 she has lost 7 pounds.  -I will set up dietician consult. She is agreeable  -I encouraged her to increase her calorie and protein intake. She can use ensure boost to help if she cannot do so with food intake. I also encouraged her to drink enough water and liquids to stay hydrated.  -I encouraged her to use her 2 off weeks of treatment to recover and gain weight back. She was able to maintain weight.  -Will monitor.  3. H/o stage IIadenocarcinoma of the sigmoid colon diagnosed in May 2007 -S/p surgicalresectionon 09/21/05.S/p adjuvant Xeloda.   4. Genetic Testing  -Given her history of cancer and sister had colon cancer, I recommend genetic testing to rule out inheritable mutations. She is interested. -Genetic referralpreviouslysent  5. HTN, B/lLEpitting edema  -Recently started on Amlodipine  -She has b/l LE edema with recent onset2 weeks ago. -I recommend she elevate her feet when sitting and to use compression socks. I also encouraged her to reduce salt intake.  PLAN: -Lab reviewed and adequate to proceed with C2 Kadcyla and Perjeta today -Lab, f/u and kadcyla/perjeta in 3 weeks    No problem-specific Assessment & Plan notes found for this encounter.   [name].  All questions were answered. The patient knows to call the clinic with any problems, questions or concerns. No barriers to learning was detected. I spent 20 minutes counseling the patient face to face. The total time spent in the appointment was 25 minutes and more than 50% was on counseling and  review of test results     Truitt Merle, MD 12/08/2018   I, Joslyn Devon, am acting as scribe for Truitt Merle, MD.   I have reviewed the above documentation for accuracy and completeness, and I agree with the above.

## 2018-12-08 ENCOUNTER — Inpatient Hospital Stay: Payer: Medicare Other

## 2018-12-08 ENCOUNTER — Telehealth: Payer: Self-pay | Admitting: Hematology

## 2018-12-08 ENCOUNTER — Other Ambulatory Visit: Payer: Self-pay

## 2018-12-08 ENCOUNTER — Inpatient Hospital Stay (HOSPITAL_BASED_OUTPATIENT_CLINIC_OR_DEPARTMENT_OTHER): Payer: Medicare Other | Admitting: Hematology

## 2018-12-08 VITALS — BP 150/64 | HR 76 | Temp 98.7°F | Resp 17 | Ht 63.0 in | Wt 135.0 lb

## 2018-12-08 VITALS — BP 160/63 | HR 72 | Temp 98.4°F | Resp 18

## 2018-12-08 DIAGNOSIS — R5383 Other fatigue: Secondary | ICD-10-CM

## 2018-12-08 DIAGNOSIS — C50412 Malignant neoplasm of upper-outer quadrant of left female breast: Secondary | ICD-10-CM

## 2018-12-08 DIAGNOSIS — R634 Abnormal weight loss: Secondary | ICD-10-CM

## 2018-12-08 DIAGNOSIS — Z794 Long term (current) use of insulin: Secondary | ICD-10-CM

## 2018-12-08 DIAGNOSIS — R590 Localized enlarged lymph nodes: Secondary | ICD-10-CM

## 2018-12-08 DIAGNOSIS — Z171 Estrogen receptor negative status [ER-]: Secondary | ICD-10-CM

## 2018-12-08 DIAGNOSIS — I351 Nonrheumatic aortic (valve) insufficiency: Secondary | ICD-10-CM

## 2018-12-08 DIAGNOSIS — Z5112 Encounter for antineoplastic immunotherapy: Secondary | ICD-10-CM

## 2018-12-08 DIAGNOSIS — C773 Secondary and unspecified malignant neoplasm of axilla and upper limb lymph nodes: Secondary | ICD-10-CM | POA: Diagnosis not present

## 2018-12-08 DIAGNOSIS — R202 Paresthesia of skin: Secondary | ICD-10-CM

## 2018-12-08 DIAGNOSIS — Z79899 Other long term (current) drug therapy: Secondary | ICD-10-CM

## 2018-12-08 DIAGNOSIS — I10 Essential (primary) hypertension: Secondary | ICD-10-CM

## 2018-12-08 DIAGNOSIS — E109 Type 1 diabetes mellitus without complications: Secondary | ICD-10-CM

## 2018-12-08 DIAGNOSIS — R630 Anorexia: Secondary | ICD-10-CM

## 2018-12-08 DIAGNOSIS — Z85038 Personal history of other malignant neoplasm of large intestine: Secondary | ICD-10-CM

## 2018-12-08 DIAGNOSIS — R609 Edema, unspecified: Secondary | ICD-10-CM

## 2018-12-08 LAB — CMP (CANCER CENTER ONLY)
ALT: 38 U/L (ref 0–44)
AST: 41 U/L (ref 15–41)
Albumin: 3.8 g/dL (ref 3.5–5.0)
Alkaline Phosphatase: 80 U/L (ref 38–126)
Anion gap: 7 (ref 5–15)
BUN: 21 mg/dL (ref 8–23)
CO2: 26 mmol/L (ref 22–32)
Calcium: 9.3 mg/dL (ref 8.9–10.3)
Chloride: 106 mmol/L (ref 98–111)
Creatinine: 0.8 mg/dL (ref 0.44–1.00)
GFR, Est AFR Am: >60 mL/min (ref >60)
GFR, Estimated: >60 mL/min (ref >60)
Glucose, Bld: 97 mg/dL (ref 70–99)
Potassium: 4 mmol/L (ref 3.5–5.1)
Sodium: 139 mmol/L (ref 135–145)
Total Bilirubin: 0.5 mg/dL (ref 0.3–1.2)
Total Protein: 7.3 g/dL (ref 6.5–8.1)

## 2018-12-08 LAB — CBC WITH DIFFERENTIAL (CANCER CENTER ONLY)
Abs Immature Granulocytes: 0.01 10*3/uL (ref 0.00–0.07)
Basophils Absolute: 0 10*3/uL (ref 0.0–0.1)
Basophils Relative: 1 %
Eosinophils Absolute: 0.1 10*3/uL (ref 0.0–0.5)
Eosinophils Relative: 1 %
HCT: 39.6 % (ref 36.0–46.0)
Hemoglobin: 13.2 g/dL (ref 12.0–15.0)
Immature Granulocytes: 0 %
Lymphocytes Relative: 31 %
Lymphs Abs: 1.7 10*3/uL (ref 0.7–4.0)
MCH: 30.9 pg (ref 26.0–34.0)
MCHC: 33.3 g/dL (ref 30.0–36.0)
MCV: 92.7 fL (ref 80.0–100.0)
Monocytes Absolute: 0.9 10*3/uL (ref 0.1–1.0)
Monocytes Relative: 15 %
Neutro Abs: 2.9 10*3/uL (ref 1.7–7.7)
Neutrophils Relative %: 52 %
Platelet Count: 213 10*3/uL (ref 150–400)
RBC: 4.27 MIL/uL (ref 3.87–5.11)
RDW: 12.5 % (ref 11.5–15.5)
WBC Count: 5.6 10*3/uL (ref 4.0–10.5)
nRBC: 0 % (ref 0.0–0.2)

## 2018-12-08 MED ORDER — DIPHENHYDRAMINE HCL 25 MG PO CAPS
50.0000 mg | ORAL_CAPSULE | Freq: Once | ORAL | Status: AC
  Administered 2018-12-08: 50 mg via ORAL

## 2018-12-08 MED ORDER — SODIUM CHLORIDE 0.9 % IV SOLN
420.0000 mg | Freq: Once | INTRAVENOUS | Status: AC
  Administered 2018-12-08: 420 mg via INTRAVENOUS
  Filled 2018-12-08: qty 14

## 2018-12-08 MED ORDER — DIPHENHYDRAMINE HCL 25 MG PO CAPS
ORAL_CAPSULE | ORAL | Status: AC
  Filled 2018-12-08: qty 2

## 2018-12-08 MED ORDER — SODIUM CHLORIDE 0.9 % IV SOLN
Freq: Once | INTRAVENOUS | Status: AC
  Administered 2018-12-08: 09:00:00 via INTRAVENOUS
  Filled 2018-12-08: qty 250

## 2018-12-08 MED ORDER — ACETAMINOPHEN 325 MG PO TABS
650.0000 mg | ORAL_TABLET | Freq: Once | ORAL | Status: AC
  Administered 2018-12-08: 650 mg via ORAL

## 2018-12-08 MED ORDER — SODIUM CHLORIDE 0.9 % IV SOLN
3.6000 mg/kg | Freq: Once | INTRAVENOUS | Status: AC
  Administered 2018-12-08: 220 mg via INTRAVENOUS
  Filled 2018-12-08: qty 8

## 2018-12-08 MED ORDER — ACETAMINOPHEN 325 MG PO TABS
ORAL_TABLET | ORAL | Status: AC
  Filled 2018-12-08: qty 2

## 2018-12-08 NOTE — Telephone Encounter (Signed)
Scheduled appt per 7/31 LOS - pt to get an updated schedule in the treatment area.

## 2018-12-08 NOTE — Patient Instructions (Signed)
[facility] Discharge Instructions for Patients Receiving Chemotherapy  Today you received the following immunotherapy agents:  Kadcyla & Perjeta  To help prevent nausea and vomiting after your treatment, we encourage you to take your nausea medication as needed. If you develop nausea and vomiting that is not controlled by your nausea medication, call the clinic.   BELOW ARE SYMPTOMS THAT SHOULD BE REPORTED IMMEDIATELY:  *FEVER GREATER THAN 100.5 F  *CHILLS WITH OR WITHOUT FEVER  NAUSEA AND VOMITING THAT IS NOT CONTROLLED WITH YOUR NAUSEA MEDICATION  *UNUSUAL SHORTNESS OF BREATH  *UNUSUAL BRUISING OR BLEEDING  TENDERNESS IN MOUTH AND THROAT WITH OR WITHOUT PRESENCE OF ULCERS  *URINARY PROBLEMS  *BOWEL PROBLEMS  UNUSUAL RASH Items with * indicate a potential emergency and should be followed up as soon as possible.  Feel free to call the clinic should you have any questions or concerns. The clinic phone number is [phone].  Please show the [place] at check-in to the Emergency Department and triage nurse.  Pertuzumab injection What is this medicine? PERTUZUMAB (per TOOZ ue mab) is a monoclonal antibody. It is used to treat breast cancer. This medicine may be used for other purposes; ask your health care provider or pharmacist if you have questions. COMMON BRAND NAME(S): PERJETA What should I tell my health care provider before I take this medicine? They need to know if you have any of these conditions:  heart disease  heart failure  high blood pressure  history of irregular heart beat  recent or ongoing radiation therapy  an unusual or allergic reaction to pertuzumab, other medicines, foods, dyes, or preservatives  pregnant or trying to get pregnant  breast-feeding How should I use this medicine? This medicine is for infusion into a vein. It is given by a health care professional in a hospital or clinic setting. Talk to your  pediatrician regarding the use of this medicine in children. Special care may be needed. Overdosage: If you think you have taken too much of this medicine contact a poison control center or emergency room at once. NOTE: This medicine is only for you. Do not share this medicine with others. What if I miss a dose? It is important not to miss your dose. Call your doctor or health care professional if you are unable to keep an appointment. What may interact with this medicine? Interactions are not expected. Give your health care provider a list of all the medicines, herbs, non-prescription drugs, or dietary supplements you use. Also tell them if you smoke, drink alcohol, or use illegal drugs. Some items may interact with your medicine. This list may not describe all possible interactions. Give your health care provider a list of all the medicines, herbs, non-prescription drugs, or dietary supplements you use. Also tell them if you smoke, drink alcohol, or use illegal drugs. Some items may interact with your medicine. What should I watch for while using this medicine? Your condition will be monitored carefully while you are receiving this medicine. Report any side effects. Continue your course of treatment even though you feel ill unless your doctor tells you to stop. Do not become pregnant while taking this medicine or for 7 months after stopping it. Women should inform their doctor if they wish to become pregnant or think they might be pregnant. Women of child-bearing potential will need to have a negative pregnancy test before starting this medicine. There is a potential for serious side effects to an unborn child. Talk  to your health care professional or pharmacist for more information. Do not breast-feed an infant while taking this medicine or for 7 months after stopping it. Women must use effective birth control with this medicine. Call your doctor or health care professional for advice if you get a  fever, chills or sore throat, or other symptoms of a cold or flu. Do not treat yourself. Try to avoid being around people who are sick. You may experience fever, chills, and headache during the infusion. Report any side effects during the infusion to your health care professional. What side effects may I notice from receiving this medicine? Side effects that you should report to your doctor or health care professional as soon as possible:  breathing problems  chest pain or palpitations  dizziness  feeling faint or lightheaded  fever or chills  skin rash, itching or hives  sore throat  swelling of the face, lips, or tongue  swelling of the legs or ankles  unusually weak or tired Side effects that usually do not require medical attention (report to your doctor or health care professional if they continue or are bothersome):  diarrhea  hair loss  nausea, vomiting  tiredness This list may not describe all possible side effects. Call your doctor for medical advice about side effects. You may report side effects to FDA at 1-800-FDA-1088. Where should I keep my medicine? This drug is given in a hospital or clinic and will not be stored at home. NOTE: This sheet is a summary. It may not cover all possible information. If you have questions about this medicine, talk to your doctor, pharmacist, or health care provider.  2020 Elsevier/Gold Standard (2015-05-29 12:08:50)          What is this medicine? ADO-TRASTUZUMAB EMTANSINE (ADD oh traz TOO zuh mab em TAN zine) is a monoclonal antibody combined with chemotherapy. It is used to treat breast cancer. This medicine may be used for other purposes; ask your health care provider or pharmacist if you have questions. COMMON BRAND NAME(S): Kadcyla What should I tell my health care provider before I take this medicine? They need to know if you have any of these conditions:  heart disease  heart failure  infection (especially a  virus infection such as chickenpox, cold sores, or herpes)  liver disease  lung or breathing disease, like asthma  tingling of the fingers or toes, or other nerve disorder  an unusual or allergic reaction to ado-trastuzumab emtansine, other medications, foods, dyes, or preservatives  pregnant or trying to get pregnant  breast-feeding How should I use this medicine? This medicine is for infusion into a vein. It is given by a health care professional in a hospital or clinic setting. Talk to your pediatrician regarding the use of this medicine in children. Special care may be needed. Overdosage: If you think you have taken too much of this medicine contact a poison control center or emergency room at once. NOTE: This medicine is only for you. Do not share this medicine with others. What if I miss a dose? It is important not to miss your dose. Call your doctor or health care professional if you are unable to keep an appointment. What may interact with this medicine? This medicine may also interact with the following medications:  atazanavir  boceprevir  clarithromycin  delavirdine  indinavir  dalfopristin; quinupristin  isoniazid, INH  itraconazole  ketoconazole  nefazodone  nelfinavir  ritonavir  telaprevir  telithromycin  tipranavir  voriconazole This list  may not describe all possible interactions. Give your health care provider a list of all the medicines, herbs, non-prescription drugs, or dietary supplements you use. Also tell them if you smoke, drink alcohol, or use illegal drugs. Some items may interact with your medicine. What should I watch for while using this medicine? Visit your doctor for checks on your progress. This drug may make you feel generally unwell. This is not uncommon, as chemotherapy can affect healthy cells as well as cancer cells. Report any side effects. Continue your course of treatment even though you feel ill unless your doctor tells  you to stop. You may need blood work done while you are taking this medicine. Call your doctor or health care professional for advice if you get a fever, chills or sore throat, or other symptoms of a cold or flu. Do not treat yourself. This drug decreases your body's ability to fight infections. Try to avoid being around people who are sick. Be careful brushing and flossing your teeth or using a toothpick because you may get an infection or bleed more easily. If you have any dental work done, tell your dentist you are receiving this medicine. Avoid taking products that contain aspirin, acetaminophen, ibuprofen, naproxen, or ketoprofen unless instructed by your doctor. These medicines may hide a fever. Do not become pregnant while taking this medicine or for 7 months after stopping it, men with female partners should use contraception during treatment and for 4 months after the last dose. Women should inform their doctor if they wish to become pregnant or think they might be pregnant. There is a potential for serious side effects to an unborn child. Do not breast-feed an infant while taking this medicine or for 7 months after the last dose. Men who have a partner who is pregnant or who is capable of becoming pregnant should use a condom during sexual activity while taking this medicine and for 4 months after stopping it. Men should inform their doctors if they wish to father a child. This medicine may lower sperm counts. Talk to your health care professional or pharmacist for more information. What side effects may I notice from receiving this medicine? Side effects that you should report to your doctor or health care professional as soon as possible:  allergic reactions like skin rash, itching or hives, swelling of the face, lips, or tongue  breathing problems  chest pain or palpitations  fever or chills, sore throat  general ill feeling or flu-like symptoms  light-colored stools  nausea,  vomiting  pain, tingling, numbness in the hands or feet  signs and symptoms of bleeding such as bloody or black, tarry stools; red or dark-brown urine; spitting up blood or brown material that looks like coffee grounds; red spots on the skin; unusual bruising or bleeding from the eye, gums, or nose  swelling of the legs or ankles  yellowing of the eyes or skin Side effects that usually do not require medical attention (report to your doctor or health care professional if they continue or are bothersome):  changes in taste  constipation  dizziness  headache  joint pain  muscle pain  trouble sleeping  unusually weak or tired This list may not describe all possible side effects. Call your doctor for medical advice about side effects. You may report side effects to FDA at 1-800-FDA-1088. Where should I keep my medicine? This drug is given in a hospital or clinic and will not be stored at home. NOTE: This sheet  is a summary. It may not cover all possible information. If you have questions about this medicine, talk to your doctor, pharmacist, or health care provider.  2020 Elsevier/Gold Standard (2017-09-23 10:03:15)

## 2018-12-09 ENCOUNTER — Encounter: Payer: Self-pay | Admitting: Hematology

## 2018-12-09 LAB — CANCER ANTIGEN 27.29: CA 27.29: 29.4 U/mL (ref 0.0–38.6)

## 2018-12-28 NOTE — Progress Notes (Signed)
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 Findings were a 5.9 x 3.8 x 1.2 cm moderate to well-differentiated adenocarcinoma penetrating the muscular wall. Forty lymph nodes negative. No vascular or lymphatic invasion. Multiple diverticula with microabscess formation and inflammation. Carcinoma present in at least 1 diverticulum. Preop CEA 5.2 with lab normal range 0 to 2.5. Preop CT scan with no obvious additional pathology.    2007 -  Chemotherapy   She received oral Xeloda chemotherapy as an adjuvant. She declined treatment on a clinical trial.    11/12/2011 Initial Diagnosis   [name], stage II   12/09/2011 Procedure   Followup colonoscopy done on 12/09/2011. She was found to have 2 polyps which were removed. Chronic diverticulosis.     Malignant neoplasm of upper-outer quadrant of left breast in female, estrogen receptor negative ([facility])  09/26/2018 Mammogram   Mammogram/US of left breast 09/26/18 IMPRESSION:  1. 2.9cm irregular mass in the upper outer left breast corresponds to the palpable abnormality. This is highly suspicious for breast carcinoma.  2. Two adjacent abnormal left axillary  Lymph nodes suspicious for metastatic adenopathy. There is a third borderline abnormal left axillary LN with a cortex thickened to 46m.  3. Benign right breast cyst. No evidence of right breast malignancy.    10/11/2018 Cancer Staging   Staging form: Breast, AJCC 8th Edition - Clinical stage from 10/11/2018: Stage IIB (cT2, cN1, cM0, G3, ER-, PR-,  HER2+) - Signed by [name] MD on 11/02/2018   10/11/2018 Initial Biopsy   Diagnosis 10/11/18 1. Breast, left, needle core biopsy, upper outer left 2 o'clock - INVASIVE DUCTAL CARCINOMA. - DUCTAL CARCINOMA IN SITU. - LYMPHOVASCULAR INVASION IS IDENTIFIED. - SEE COMMENT. 2. Lymph node, needle/core biopsy, left axilla - INVASIVE DUCTAL CARCINOMA. - SEE COMMENT.   10/11/2018 Receptors her2   The tumor cells are POSITIVE for Her2 (3+). Estrogen Receptor: 0%, NEGATIVE Progesterone Receptor: 0%, NEGATIVE Proliferation Marker Ki67: 70%   11/02/2018 Initial Diagnosis   Malignant neoplasm of upper-outer quadrant of left breast in female, estrogen receptor negative [place]   11/15/2018 Breast MRI   MRI breast 11/15/18  IMPRESSION: 1. 3.3 centimeter mass in the LATERAL portion of the LEFT breast consistent with known malignancy. 2. There is significant non mass enhancement surrounding this mass and extending anteriorly into the nipple base, with largest diameter in the anterior to posterior axis, measuring 7.2 centimeters. 3. If the patient would consider breast conservation, additional MR guided core biopsies are recommended. Consider biopsy of the inferior and anterior extent of the non mass enhancement to document extent of disease. 4. Three enlarged LEFT axillary lymph nodes. 5. RIGHT breast is negative.   11/15/2018 PET scan   PET 11/15/18 IMPRESSION: Hypermetabolic left breast lesion compatible with known primary. Hypermetabolic left axillary lymph nodes are consistent with metastatic disease.   No evidence for additional hypermetabolic metastatic involvement in the neck, chest, abdomen, or pelvis.   11/17/2018 -  Chemotherapy   Neo-adjuvant Kadcyla and perjeta q3weeks for 6 cycles starting 11/17/18, will continue Perjeta  for 1 year total treatment.    11/29/2018 Pathology Results   Diagnosis 1. Breast, left, needle core biopsy, inferior anterior (cylinder clip) - INVASIVE DUCTAL  CARCINOMA. - DUCTAL CARCINOMA IN SITU. - LYMPHOVASCULAR INVASION IS IDENTIFIED. - SEE COMMENT. 2. Breast, left, needle core biopsy, central posterior (barbell clip) - INVASIVE DUCTAL CARCINOMA. - LYMPHOVASCULAR INVASION IS IDENTIFIED.      CURRENT THERAPY:  Kadcylaand perjetaq3weeks starting 11/17/18  INTERVAL HISTORY:  Jasmin Lewis is here for a follow up and treatment. She presents to the clinic alone.  She is clinically doing very well, has been tolerating treatment very well.  She has mild fatigue towards the end of the day, able to function very well at home.  No nausea, vomiting, diarrhea, or other new symptoms.  She is quite nervous when she comes in to our clinic and sees other sick patients, she has been healthy for her whole life.    REVIEW OF SYSTEMS:   Constitutional: Denies fevers, chills or abnormal weight loss, (+) mild fatigue Eyes: Denies blurriness of vision Ears, nose, mouth, throat, and face: Denies mucositis or sore throat Respiratory: Denies cough, dyspnea or wheezes Cardiovascular: Denies palpitation, chest discomfort or lower extremity swelling Gastrointestinal:  Denies nausea, heartburn or change in bowel habits Skin: Denies abnormal skin rashes Lymphatics: Denies new lymphadenopathy or easy bruising Neurological:Denies numbness, tingling or new weaknesses Behavioral/Psych: Mood is stable, no new changes  All other systems were reviewed with the patient and are negative.  MEDICAL HISTORY:  Past Medical History:  Diagnosis Date  . [name], stage II 11/12/2011   Sigmoid lesion 5.9 cm  40 nodes negative but focus of cancer in a diverticum   Pre-op CEA 5.2 with lab normal up to 2.5 resected 09/21/05   Xeloda adjuvant chemotherapy  . Hypertension     SURGICAL HISTORY: Past Surgical History:  Procedure Laterality Date  . HEMICOLECTOMY  2007    I have reviewed the social history and family history with the patient and they are unchanged from  previous note.  ALLERGIES:  has No Known Allergies.  MEDICATIONS:  Current Outpatient Medications  Medication Sig Dispense Refill  . amLODipine (NORVASC) 5 MG tablet Take by mouth.    . calcium citrate-vitamin D (CITRACAL+D) 315-200 MG-UNIT per tablet Take 1 tablet by mouth daily.    . fish oil-omega-3 fatty acids 1000 MG capsule Take 1 g by mouth daily.    . ondansetron (ZOFRAN) 8 MG tablet Take 1 tablet (8 mg total) by mouth every 8 (eight) hours as needed for nausea or vomiting. 20 tablet 2   No current facility-administered medications for this visit.     PHYSICAL EXAMINATION: ECOG PERFORMANCE STATUS: 1 - Symptomatic but completely ambulatory  Vitals:   12/29/18 0817  BP: (!) 183/60  Pulse: 84  Resp: 17  Temp: 98.5 F (36.9 C)  SpO2: 96%   Filed Weights   12/29/18 0817  Weight: 134 lb 8 oz (61 kg)   GENERAL:alert, no distress and comfortable SKIN: skin color, texture, turgor are normal, no rashes or significant lesions EYES: normal, Conjunctiva are pink and non-injected, sclera clear NECK: supple, thyroid normal size, non-tender, without nodularity LYMPH:  no palpable lymphadenopathy in the cervical, axillary  LUNGS: clear to auscultation and percussion with normal breathing effort HEART: regular rate & rhythm and no murmurs and no lower extremity edema ABDOMEN:abdomen soft, non-tender and normal bowel sounds Musculoskeletal:no cyanosis of digits and no clubbing  NEURO: alert & oriented x  3 with fluent speech, no focal motor/sensory deficits Breast exam deferred today  LABORATORY DATA:  I have reviewed the data as listed CBC Latest Ref Rng & Units 12/29/2018 12/08/2018 11/17/2018  WBC 4.0 - 10.5 K/uL 5.4 5.6 4.5  Hemoglobin 12.0 - 15.0 g/dL 13.2 13.2 13.8  Hematocrit 36.0 - 46.0 % 39.6 39.6 40.8  Platelets 150 - 400 K/uL 182 213 172     CMP Latest Ref Rng & Units 12/08/2018 11/17/2018 11/02/2018  Glucose 70 - 99 mg/dL 97 109(H) 92  BUN 8 - 23 mg/dL 21 19 18    Creatinine 0.44 - 1.00 mg/dL 0.80 0.80 0.90  Sodium 135 - 145 mmol/L 139 140 140  Potassium 3.5 - 5.1 mmol/L 4.0 4.1 4.2  Chloride 98 - 111 mmol/L 106 109 106  CO2 22 - 32 mmol/L 26 22 25   Calcium 8.9 - 10.3 mg/dL 9.3 9.1 9.6  Total Protein 6.5 - 8.1 g/dL 7.3 7.2 7.8  Total Bilirubin 0.3 - 1.2 mg/dL 0.5 0.4 0.4  Alkaline Phos 38 - 126 U/L 80 69 69  AST 15 - 41 U/L 41 28 30  ALT 0 - 44 U/L 38 17 21      RADIOGRAPHIC STUDIES: I have personally reviewed the radiological images as listed and agreed with the findings in the report. No results found.   ASSESSMENT & PLAN:  MARIYANNA MUCHA is a 85 y.o. 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 2 abnormal LNs were seen on [place], 1 was biopsied.Her tumor is negative for ER and PR, HER2 positive, which is more aggressive -staging PET was negative for distant metastasis -Istarted her onneoadjuvant andadjuvant chemo T-DM1 (Kadcyla) and Perjetafor a total 1 year treatmenton 11/17/18.She tolerated moderately well with loss of appetite and mild weight loss with fatigue. -I discussed her chemo treatment plan will remain the same, but may change her breast surgery to mastectomy. This will be determined to her response to neoadjuvant chemo. She understands.  -plan 4-6 cycle neoadjuvant Kadcyla. Will repeat breast scan after 4 cycles to determine if she can stop treatment earlier and proceed with surgery sooner.  -She is tolerating treatment very well, will continue etc., cycle 3 today.  Lab reviewed, adequate for treatment. -Plan to repeat ultrasound after next cycle treatment to evaluate her response   2. Low appetite, weight loss -S/p cycle 1 she has lost 7 pounds,  less weight loss lately  -I again encouraged her to increase her calorie and protein intake. She can use ensure boost to help if she cannot do so with food intake. I also encouraged her to drink enough water and liquids to stay hydrated.  -f/u with dietician  -Will monitor.  3. H/o stage IIadenocarcinoma of the sigmoid colon diagnosed in May 2007 -S/p surgicalresectionon 09/21/05.S/p adjuvant Xeloda.   4. Genetic Testing  -Given her history of cancer and sister had colon cancer, I recommend genetic testing to rule out inheritable mutations. She is interested. -Genetic referralpreviouslysent  5. HTN, B/lLEpitting edema  -Recently started on Amlodipine  -She has b/l LE edema with recent onset2 weeks ago. -resolved now, it is probably related to amlodipine  PLAN: -Lab reviewed and adequate to proceed with C3 Kadcyla and Perjeta today -Lab, f/u and kadcyla/perjeta in 3 weeks, will order ultrasound to evaluate response after  next cycle      No problem-specific Assessment & Plan notes found for this encounter.   [name].  All questions were answered. The patient knows to call the clinic with any problems, questions or concerns. No barriers to learning was detected. I spent 15 minutes counseling the patient face to face. The total time spent in the appointment was 20 minutes and more than 50% was on counseling and review of test results     Truitt Merle, MD 12/29/2018   I, Joslyn Devon, am acting as scribe for Truitt Merle, MD.   I have reviewed the above documentation for accuracy and completeness, and I agree with the above.

## 2018-12-29 ENCOUNTER — Encounter: Payer: Self-pay | Admitting: Hematology

## 2018-12-29 ENCOUNTER — Other Ambulatory Visit: Payer: Self-pay

## 2018-12-29 ENCOUNTER — Inpatient Hospital Stay: Payer: Medicare Other

## 2018-12-29 ENCOUNTER — Inpatient Hospital Stay: Payer: Medicare Other | Attending: Hematology

## 2018-12-29 ENCOUNTER — Inpatient Hospital Stay (HOSPITAL_BASED_OUTPATIENT_CLINIC_OR_DEPARTMENT_OTHER): Payer: Medicare Other | Admitting: Hematology

## 2018-12-29 ENCOUNTER — Telehealth: Payer: Self-pay | Admitting: Hematology

## 2018-12-29 VITALS — BP 152/62

## 2018-12-29 VITALS — BP 183/60 | HR 84 | Temp 98.5°F | Resp 17 | Ht 63.0 in | Wt 134.5 lb

## 2018-12-29 DIAGNOSIS — C773 Secondary and unspecified malignant neoplasm of axilla and upper limb lymph nodes: Secondary | ICD-10-CM | POA: Insufficient documentation

## 2018-12-29 DIAGNOSIS — Z85038 Personal history of other malignant neoplasm of large intestine: Secondary | ICD-10-CM | POA: Diagnosis not present

## 2018-12-29 DIAGNOSIS — Z79899 Other long term (current) drug therapy: Secondary | ICD-10-CM | POA: Insufficient documentation

## 2018-12-29 DIAGNOSIS — Z794 Long term (current) use of insulin: Secondary | ICD-10-CM | POA: Insufficient documentation

## 2018-12-29 DIAGNOSIS — Z171 Estrogen receptor negative status [ER-]: Secondary | ICD-10-CM | POA: Insufficient documentation

## 2018-12-29 DIAGNOSIS — C50412 Malignant neoplasm of upper-outer quadrant of left female breast: Secondary | ICD-10-CM

## 2018-12-29 DIAGNOSIS — I10 Essential (primary) hypertension: Secondary | ICD-10-CM | POA: Diagnosis not present

## 2018-12-29 DIAGNOSIS — Z5112 Encounter for antineoplastic immunotherapy: Secondary | ICD-10-CM | POA: Diagnosis present

## 2018-12-29 LAB — CMP (CANCER CENTER ONLY)
ALT: 36 U/L (ref 0–44)
AST: 47 U/L — ABNORMAL HIGH (ref 15–41)
Albumin: 3.8 g/dL (ref 3.5–5.0)
Alkaline Phosphatase: 87 U/L (ref 38–126)
Anion gap: 11 (ref 5–15)
BUN: 23 mg/dL (ref 8–23)
CO2: 22 mmol/L (ref 22–32)
Calcium: 9.4 mg/dL (ref 8.9–10.3)
Chloride: 107 mmol/L (ref 98–111)
Creatinine: 0.8 mg/dL (ref 0.44–1.00)
GFR, Est AFR Am: >60 mL/min (ref >60)
GFR, Estimated: >60 mL/min (ref >60)
Glucose, Bld: 89 mg/dL (ref 70–99)
Potassium: 3.9 mmol/L (ref 3.5–5.1)
Sodium: 140 mmol/L (ref 135–145)
Total Bilirubin: 0.5 mg/dL (ref 0.3–1.2)
Total Protein: 7.5 g/dL (ref 6.5–8.1)

## 2018-12-29 LAB — CBC WITH DIFFERENTIAL (CANCER CENTER ONLY)
Abs Immature Granulocytes: 0.01 10*3/uL (ref 0.00–0.07)
Basophils Absolute: 0 10*3/uL (ref 0.0–0.1)
Basophils Relative: 1 %
Eosinophils Absolute: 0.1 10*3/uL (ref 0.0–0.5)
Eosinophils Relative: 2 %
HCT: 39.6 % (ref 36.0–46.0)
Hemoglobin: 13.2 g/dL (ref 12.0–15.0)
Immature Granulocytes: 0 %
Lymphocytes Relative: 32 %
Lymphs Abs: 1.8 10*3/uL (ref 0.7–4.0)
MCH: 31.6 pg (ref 26.0–34.0)
MCHC: 33.3 g/dL (ref 30.0–36.0)
MCV: 94.7 fL (ref 80.0–100.0)
Monocytes Absolute: 0.9 10*3/uL (ref 0.1–1.0)
Monocytes Relative: 16 %
Neutro Abs: 2.7 10*3/uL (ref 1.7–7.7)
Neutrophils Relative %: 49 %
Platelet Count: 182 10*3/uL (ref 150–400)
RBC: 4.18 MIL/uL (ref 3.87–5.11)
RDW: 12.8 % (ref 11.5–15.5)
WBC Count: 5.4 10*3/uL (ref 4.0–10.5)
nRBC: 0 % (ref 0.0–0.2)

## 2018-12-29 MED ORDER — SODIUM CHLORIDE 0.9 % IV SOLN
420.0000 mg | Freq: Once | INTRAVENOUS | Status: AC
  Administered 2018-12-29: 420 mg via INTRAVENOUS
  Filled 2018-12-29: qty 14

## 2018-12-29 MED ORDER — ACETAMINOPHEN 325 MG PO TABS
650.0000 mg | ORAL_TABLET | Freq: Once | ORAL | Status: AC
  Administered 2018-12-29: 09:00:00 650 mg via ORAL

## 2018-12-29 MED ORDER — SODIUM CHLORIDE 0.9 % IV SOLN
3.6000 mg/kg | Freq: Once | INTRAVENOUS | Status: AC
  Administered 2018-12-29: 11:00:00 220 mg via INTRAVENOUS
  Filled 2018-12-29: qty 8

## 2018-12-29 MED ORDER — SODIUM CHLORIDE 0.9 % IV SOLN
Freq: Once | INTRAVENOUS | Status: DC
  Filled 2018-12-29: qty 250

## 2018-12-29 MED ORDER — ACETAMINOPHEN 325 MG PO TABS
ORAL_TABLET | ORAL | Status: AC
  Filled 2018-12-29: qty 2

## 2018-12-29 MED ORDER — DIPHENHYDRAMINE HCL 25 MG PO CAPS
ORAL_CAPSULE | ORAL | Status: AC
  Filled 2018-12-29: qty 2

## 2018-12-29 MED ORDER — DIPHENHYDRAMINE HCL 25 MG PO CAPS
50.0000 mg | ORAL_CAPSULE | Freq: Once | ORAL | Status: AC
  Administered 2018-12-29: 09:00:00 50 mg via ORAL

## 2018-12-29 MED ORDER — SODIUM CHLORIDE 0.9 % IV SOLN
Freq: Once | INTRAVENOUS | Status: AC
  Administered 2018-12-29: 09:00:00 via INTRAVENOUS
  Filled 2018-12-29: qty 250

## 2018-12-29 NOTE — Telephone Encounter (Signed)
Scheduled appt per 8/21 los.  Patient will get an updated calendar after her treatment.

## 2018-12-29 NOTE — Patient Instructions (Signed)
[facility] Discharge Instructions for Patients Receiving Chemotherapy  Today you received the following chemotherapy agents:  Kadcyla, Perjeta  To help prevent nausea and vomiting after your treatment, we encourage you to take your nausea medication as prescribed.   If you develop nausea and vomiting that is not controlled by your nausea medication, call the clinic.   BELOW ARE SYMPTOMS THAT SHOULD BE REPORTED IMMEDIATELY:  *FEVER GREATER THAN 100.5 F  *CHILLS WITH OR WITHOUT FEVER  NAUSEA AND VOMITING THAT IS NOT CONTROLLED WITH YOUR NAUSEA MEDICATION  *UNUSUAL SHORTNESS OF BREATH  *UNUSUAL BRUISING OR BLEEDING  TENDERNESS IN MOUTH AND THROAT WITH OR WITHOUT PRESENCE OF ULCERS  *URINARY PROBLEMS  *BOWEL PROBLEMS  UNUSUAL RASH Items with * indicate a potential emergency and should be followed up as soon as possible.  Feel free to call the clinic should you have any questions or concerns. The clinic phone number is [phone].  Please show the [place] at check-in to the Emergency Department and triage nurse.   Coronavirus (COVID-19) Are you at risk?  Are you at risk for the Coronavirus (COVID-19)?  To be considered HIGH RISK for Coronavirus (COVID-19), you have to meet the following criteria:  . Traveled to [place], [place], [place], [place] or [place]; or in the [place] to [place], [place], [place], or [place]; and have fever, cough, and shortness of breath within the last 2 weeks of travel OR . Been in close contact with a person diagnosed with COVID-19 within the last 2 weeks and have fever, cough, and shortness of breath . IF YOU DO NOT MEET THESE CRITERIA, YOU ARE CONSIDERED LOW RISK FOR COVID-19.  What to do if you are HIGH RISK for COVID-19?  Marland Kitchen If you are having a medical emergency, call 911. . Seek medical care right away. Before you go to a doctor's office, urgent care or emergency department, call ahead and tell them  about your recent travel, contact with someone diagnosed with COVID-19, and your symptoms. You should receive instructions from your physician's office regarding next steps of care.  . When you arrive at healthcare provider, tell the healthcare staff immediately you have returned from visiting [place], [place], [place], [place] or [place]; or traveled in the [place] to [place], [place], [place], or [place]; in the last two weeks or you have been in close contact with a person diagnosed with COVID-19 in the last 2 weeks.   . Tell the health care staff about your symptoms: fever, cough and shortness of breath. . After you have been seen by a medical provider, you will be either: o Tested for (COVID-19) and discharged home on quarantine except to seek medical care if symptoms worsen, and asked to  - Stay home and avoid contact with others until you get your results (4-5 days)  - Avoid travel on public transportation if possible (such as bus, train, or airplane) or o Sent to the Emergency Department by EMS for evaluation, COVID-19 testing, and possible admission depending on your condition and test results.  What to do if you are LOW RISK for COVID-19?  Reduce your risk of any infection by using the same precautions used for avoiding the common cold or flu:  Marland Kitchen Wash your hands often with soap and warm water for at least 20 seconds.  If soap and water are not readily available, use an alcohol-based hand sanitizer with at least 60% alcohol.  . If  coughing or sneezing, cover your mouth and nose by coughing or sneezing into the elbow areas of your shirt or coat, into a tissue or into your sleeve (not your hands). . Avoid shaking hands with others and consider head nods or verbal greetings only. . Avoid touching your eyes, nose, or mouth with unwashed hands.  . Avoid close contact with people who are sick. . Avoid places or events with large numbers of people in one location, like concerts or  sporting events. . Carefully consider travel plans you have or are making. . If you are planning any travel outside or inside the [place], visit the CDC's Travelers' Health webpage for the latest health notices. . If you have some symptoms but not all symptoms, continue to monitor at home and seek medical attention if your symptoms worsen. . If you are having a medical emergency, call 911.   [place] / e-Visit: [email or web address]         [facility]: [facility]: [phone]                   [facility]: [phone]

## 2018-12-30 LAB — CANCER ANTIGEN 27.29: CA 27.29: 36.2 U/mL (ref 0.0–38.6)

## 2019-01-12 NOTE — Progress Notes (Signed)
Jasmin Lewis   [phone] Fax:(336) [phone]   Clinic Follow up Note   Patient Care Team: [name], MD as PCP - General (Family Medicine)  Date of Service:  01/19/2019  CHIEF COMPLAINT: F/u left breast cancer   SUMMARY OF ONCOLOGIC HISTORY: Oncology History Overview Note  Cancer Staging Malignant neoplasm of upper-outer quadrant of left breast in female, estrogen receptor negative ([place]) Staging form: Breast, AJCC 8th Edition - Clinical stage from 10/11/2018: Stage IIB (cT2, cN1, cM0, G3, ER-, PR-, HER2+) - Signed by Truitt Merle, MD on 11/02/2018 - Clinical: No stage assigned - Unsigned    [name], stage II  09/2005 Initial Diagnosis   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 Findings were a 5.9 x 3.8 x 1.2 cm moderate to well-differentiated adenocarcinoma penetrating the muscular wall. Forty lymph nodes negative. No vascular or lymphatic invasion. Multiple diverticula with microabscess formation and inflammation. Carcinoma present in at least 1 diverticulum. Preop CEA 5.2 with lab normal range 0 to 2.5. Preop CT scan with no obvious additional pathology.    2007 -  Chemotherapy   She received oral Xeloda chemotherapy as an adjuvant. She declined treatment on a clinical trial.    11/12/2011 Initial Diagnosis   [name], stage II   12/09/2011 Procedure   Followup colonoscopy done on 12/09/2011. She was found to have 2 polyps which were removed. Chronic diverticulosis.     Malignant neoplasm of upper-outer quadrant of left breast in female, estrogen receptor negative ([place])  09/26/2018 Mammogram   Mammogram/US of left breast 09/26/18 IMPRESSION:  1. 2.9cm irregular mass in the upper outer left breast corresponds to the palpable abnormality. This is highly suspicious for breast carcinoma.  2. Two adjacent abnormal left axillary  Lymph nodes  suspicious for metastatic adenopathy. There is a third borderline abnormal left axillary LN with a cortex thickened to 22m.  3. Benign right breast cyst. No evidence of right breast malignancy.    10/11/2018 Cancer Staging   Staging form: Breast, AJCC 8th Edition - Clinical stage from 10/11/2018: Stage IIB (cT2, cN1, cM0, G3, ER-, PR-, HER2+) - Signed by [name] MD on 11/02/2018   10/11/2018 Initial Biopsy   Diagnosis 10/11/18 1. Breast, left, needle core biopsy, upper outer left 2 o'clock - INVASIVE DUCTAL CARCINOMA. - DUCTAL CARCINOMA IN SITU. - LYMPHOVASCULAR INVASION IS IDENTIFIED. - SEE COMMENT. 2. Lymph node, needle/core biopsy, left axilla - INVASIVE DUCTAL CARCINOMA. - SEE COMMENT.   10/11/2018 Receptors her2   The tumor cells are POSITIVE for Her2 (3+). Estrogen Receptor: 0%, NEGATIVE Progesterone Receptor: 0%, NEGATIVE Proliferation Marker Ki67: 70%   11/02/2018 Initial Diagnosis   Malignant neoplasm of upper-outer quadrant of left breast in [place], [place]   11/15/2018 Breast MRI   MRI breast 11/15/18  IMPRESSION: 1. 3.3 centimeter mass in the LATERAL portion of the LEFT breast consistent with known malignancy. 2. There is significant non mass enhancement surrounding this mass and extending anteriorly into the nipple base, with largest diameter in the anterior to posterior axis, measuring 7.2 centimeters. 3. If the patient would consider breast conservation, additional MR guided core biopsies are recommended. Consider biopsy of the inferior and anterior extent of the non mass enhancement to document extent of disease. 4. Three enlarged LEFT axillary lymph nodes. 5. RIGHT breast is negative.   11/15/2018 PET scan   PET  11/15/18 IMPRESSION: Hypermetabolic left breast lesion compatible with known primary. Hypermetabolic left axillary lymph nodes are consistent with metastatic disease.   No evidence for additional hypermetabolic metastatic involvement  in the neck, chest, abdomen, or pelvis.   11/17/2018 -  Chemotherapy   Neo-adjuvant Kadcyla and perjeta q3weeks for 6 cycles starting 11/17/18, will continue Perjeta for 1 year total treatment.    11/29/2018 Pathology Results   Diagnosis 1. Breast, left, needle core biopsy, inferior anterior (cylinder clip) - INVASIVE DUCTAL CARCINOMA. - DUCTAL CARCINOMA IN SITU. - LYMPHOVASCULAR INVASION IS IDENTIFIED. - SEE COMMENT. 2. Breast, left, needle core biopsy, central posterior (barbell clip) - INVASIVE DUCTAL CARCINOMA. - LYMPHOVASCULAR INVASION IS IDENTIFIED.      CURRENT THERAPY:  Neoadjuvant Kadcylaand perjetaq3weeks starting 11/17/18  INTERVAL HISTORY:  Jasmin Lewis is here for a follow up and treatment. She presents to the clinic alone. She notes she is nervous when she is here but feels her BP was still more elevated today. She notes fatigue from treatment but still abele to do normal activities.    REVIEW OF SYSTEMS:   Constitutional: Denies fevers, chills or abnormal weight loss (+) fatigue, manageable  Eyes: Denies blurriness of vision Ears, nose, mouth, throat, and face: Denies mucositis or sore throat Respiratory: Denies cough, dyspnea or wheezes Cardiovascular: Denies palpitation, chest discomfort or lower extremity swelling Gastrointestinal:  Denies nausea, heartburn or change in bowel habits Skin: Denies abnormal skin rashes Lymphatics: Denies new lymphadenopathy or easy bruising Neurological:Denies numbness, tingling or new weaknesses Behavioral/Psych: Mood is stable, no new changes  All other systems were reviewed with the patient and are negative.  MEDICAL HISTORY:  Past Medical History:  Diagnosis Date   [name], stage II 11/12/2011   Sigmoid lesion 5.9 cm  40 nodes negative but focus of cancer in a diverticum   Pre-op CEA 5.2 with lab normal up to 2.5 resected 09/21/05   Xeloda adjuvant chemotherapy   Hypertension     SURGICAL HISTORY: Past  Surgical History:  Procedure Laterality Date   HEMICOLECTOMY  2007    I have reviewed the social history and family history with the patient and they are unchanged from previous note.  ALLERGIES:  has No Known Allergies.  MEDICATIONS:  Current Outpatient Medications  Medication Sig Dispense Refill   amLODipine (NORVASC) 5 MG tablet Take by mouth.     calcium citrate-vitamin D (CITRACAL+D) 315-200 MG-UNIT per tablet Take 1 tablet by mouth daily.     fish oil-omega-3 fatty acids 1000 MG capsule Take 1 g by mouth daily.     ondansetron (ZOFRAN) 8 MG tablet Take 1 tablet (8 mg total) by mouth every 8 (eight) hours as needed for nausea or vomiting. (Patient not taking: Reported on 01/19/2019) 20 tablet 2   No current facility-administered medications for this visit.     PHYSICAL EXAMINATION: ECOG PERFORMANCE STATUS: 0 - Asymptomatic  Vitals:   01/19/19 1002  BP: (!) 191/62  Pulse: 80  Resp: 18  Temp: 98.7 F (37.1 C)  SpO2: 96%   Filed Weights   01/19/19 1002  Weight: 134 lb 12.8 oz (61.1 kg)    GENERAL:alert, no distress and comfortable SKIN: skin color, texture, turgor are normal, no rashes or significant lesions EYES: normal, Conjunctiva are pink and non-injected, sclera clear  NECK: supple, thyroid normal size, non-tender, without nodularity LYMPH:  no palpable lymphadenopathy in the cervical, axillary  LUNGS: clear to auscultation and percussion with normal breathing effort HEART: regular rate &  rhythm and no murmurs and no lower extremity edema ABDOMEN:abdomen soft, non-tender and normal bowel sounds Musculoskeletal:no cyanosis of digits and no clubbing  NEURO: alert & oriented x 3 with fluent speech, no focal motor/sensory deficits  LABORATORY DATA:  I have reviewed the data as listed CBC Latest Ref Rng & Units 01/19/2019 12/29/2018 12/08/2018  WBC 4.0 - 10.5 K/uL 5.6 5.4 5.6  Hemoglobin 12.0 - 15.0 g/dL 12.9 13.2 13.2  Hematocrit 36.0 - 46.0 % 37.7 39.6 39.6    Platelets 150 - 400 K/uL 185 182 213     CMP Latest Ref Rng & Units 01/19/2019 12/29/2018 12/08/2018  Glucose 70 - 99 mg/dL 111(H) 89 97  BUN 8 - 23 mg/dL _0 Creatinine 0.44 - 1.00 mg/dL 0.84 0.80 0.80  Sodium 135 - 145 mmol/L 138 140 139  Potassium 3.5 - 5.1 mmol/L 4.1 3.9 4.0  Chloride 98 - 111 mmol/L 105 107 106  CO2 22 - 32 mmol/L _1 Calcium 8.9 - 10.3 mg/dL 9.5 9.4 9.3  Total Protein 6.5 - 8.1 g/dL 7.5 7.5 7.3  Total Bilirubin 0.3 - 1.2 mg/dL 0.4 0.5 0.5  Alkaline Phos 38 - 126 U/L 92 87 80  AST 15 - 41 U/L 52(H) 47(H) 41  ALT 0 - 44 U/L 40 36 38      RADIOGRAPHIC STUDIES: I have personally reviewed the radiological images as listed and agreed with the findings in the report. No results found.   ASSESSMENT & PLAN:  Jasmin Lewis is a 85 y.o. 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 2 abnormal LNs were seen on [place], 1 was biopsied.Her tumor is negative for ER and PR, HER2 positive, which is more aggressive -staging PET was negative for distant metastasis -Istarted her onneoadjuvant andadjuvant chemo T-DM1 (Kadcyla) and Perjetafor a total 1 year treatmenton 11/17/18.Shetoleratedmoderatelywell withmildweight loss and fatigue. -Her breast mass is no longer palpable after C1, indicating good clinical response to treatment.  -Plan for breast MRI after C5 or 6. Plan to complete 6 cycles of Kadcyla before proceeding with surgery.  -She is apprehensive of PAC placement. She would like to continue peripheral access for as long as she can tolerate. This is understandable.  -Labs reviewed, CBC and CMP WNL except BG 111, AST 52. CA 27.29 still pending. Overall adequate to proceed with  C4 Kadcyla/Perjeta today  -f/u in 3 weeksfor cycle 5   2. Low appetite, weight loss -S/p cycle 1 she has lost 7 pounds, less weight loss lately  -Iagain encouragedher to increase her calorie and proteinintake. She can use ensure boost to help if she cannot do so with food intake. I also encouraged her to drink enough water and liquids to stay hydrated.  -f/u with dietician  -Will monitor.Weight stable lately.   3. H/o stage IIadenocarcinoma of the sigmoid colon diagnosed in May 2007 -S/p surgicalresectionon 09/21/05.S/p adjuvant Xeloda.   4. Genetic Testing  -Given her history of cancer and sister had colon cancer, I recommend genetic testing to rule out inheritable mutations. She is interested. -Genetic referralpreviouslysent  5. HTN, B/lLEpitting edema  -Recently started on Amlodipine  -Edema resolved after a few weeks. It is probably related to amlodipine.  -She is nervous when she is at doctor's office. She notes her BP  is much better at home.  -BP at 191/62, will repeat in infusion room (01/19/19)  PLAN: -Lab reviewed and adequate to proceed with C4 Kadcyla and Perjeta today -Lab, f/u and Kadcyla/perjeta in 3 and 6 weeks -Breast MRI after cycle 5 or 6, will discuss with Dr. [name]    No problem-specific Assessment & Plan notes found for this encounter.   [name].  All questions were answered. The patient knows to call the clinic with any problems, questions or concerns. No barriers to learning was detected. I spent 15 minutes counseling the patient face to face. The total time spent in the appointment was 20 minutes and more than 50% was on counseling and review of test results     Truitt Merle, MD 01/19/2019   I, Joslyn Devon, am acting as scribe for Truitt Merle, MD.   I have reviewed the above documentation for accuracy and completeness, and I agree with the above.

## 2019-01-19 ENCOUNTER — Other Ambulatory Visit: Payer: Self-pay

## 2019-01-19 ENCOUNTER — Telehealth: Payer: Self-pay | Admitting: Hematology

## 2019-01-19 ENCOUNTER — Inpatient Hospital Stay: Payer: Medicare Other | Attending: Hematology

## 2019-01-19 ENCOUNTER — Encounter: Payer: Self-pay | Admitting: Hematology

## 2019-01-19 ENCOUNTER — Inpatient Hospital Stay: Payer: Medicare Other

## 2019-01-19 ENCOUNTER — Inpatient Hospital Stay (HOSPITAL_BASED_OUTPATIENT_CLINIC_OR_DEPARTMENT_OTHER): Payer: Medicare Other | Admitting: Hematology

## 2019-01-19 VITALS — BP 191/62 | HR 80 | Temp 98.7°F | Resp 18 | Ht 63.0 in | Wt 134.8 lb

## 2019-01-19 VITALS — BP 173/70 | HR 72 | Temp 98.4°F

## 2019-01-19 DIAGNOSIS — C50412 Malignant neoplasm of upper-outer quadrant of left female breast: Secondary | ICD-10-CM | POA: Diagnosis present

## 2019-01-19 DIAGNOSIS — Z85038 Personal history of other malignant neoplasm of large intestine: Secondary | ICD-10-CM | POA: Diagnosis not present

## 2019-01-19 DIAGNOSIS — Z5112 Encounter for antineoplastic immunotherapy: Secondary | ICD-10-CM | POA: Insufficient documentation

## 2019-01-19 DIAGNOSIS — Z794 Long term (current) use of insulin: Secondary | ICD-10-CM | POA: Insufficient documentation

## 2019-01-19 DIAGNOSIS — I10 Essential (primary) hypertension: Secondary | ICD-10-CM | POA: Insufficient documentation

## 2019-01-19 DIAGNOSIS — C773 Secondary and unspecified malignant neoplasm of axilla and upper limb lymph nodes: Secondary | ICD-10-CM | POA: Diagnosis not present

## 2019-01-19 DIAGNOSIS — Z79899 Other long term (current) drug therapy: Secondary | ICD-10-CM | POA: Diagnosis not present

## 2019-01-19 DIAGNOSIS — Z171 Estrogen receptor negative status [ER-]: Secondary | ICD-10-CM | POA: Insufficient documentation

## 2019-01-19 LAB — CMP (CANCER CENTER ONLY)
ALT: 40 U/L (ref 0–44)
AST: 52 U/L — ABNORMAL HIGH (ref 15–41)
Albumin: 3.9 g/dL (ref 3.5–5.0)
Alkaline Phosphatase: 92 U/L (ref 38–126)
Anion gap: 8 (ref 5–15)
BUN: 19 mg/dL (ref 8–23)
CO2: 25 mmol/L (ref 22–32)
Calcium: 9.5 mg/dL (ref 8.9–10.3)
Chloride: 105 mmol/L (ref 98–111)
Creatinine: 0.84 mg/dL (ref 0.44–1.00)
GFR, Est AFR Am: >60 mL/min (ref >60)
GFR, Estimated: >60 mL/min (ref >60)
Glucose, Bld: 111 mg/dL — ABNORMAL HIGH (ref 70–99)
Potassium: 4.1 mmol/L (ref 3.5–5.1)
Sodium: 138 mmol/L (ref 135–145)
Total Bilirubin: 0.4 mg/dL (ref 0.3–1.2)
Total Protein: 7.5 g/dL (ref 6.5–8.1)

## 2019-01-19 LAB — CBC WITH DIFFERENTIAL (CANCER CENTER ONLY)
Abs Immature Granulocytes: 0.02 10*3/uL (ref 0.00–0.07)
Basophils Absolute: 0 10*3/uL (ref 0.0–0.1)
Basophils Relative: 1 %
Eosinophils Absolute: 0.1 10*3/uL (ref 0.0–0.5)
Eosinophils Relative: 1 %
HCT: 37.7 % (ref 36.0–46.0)
Hemoglobin: 12.9 g/dL (ref 12.0–15.0)
Immature Granulocytes: 0 %
Lymphocytes Relative: 33 %
Lymphs Abs: 1.8 10*3/uL (ref 0.7–4.0)
MCH: 32 pg (ref 26.0–34.0)
MCHC: 34.2 g/dL (ref 30.0–36.0)
MCV: 93.5 fL (ref 80.0–100.0)
Monocytes Absolute: 0.8 10*3/uL (ref 0.1–1.0)
Monocytes Relative: 13 %
Neutro Abs: 2.9 10*3/uL (ref 1.7–7.7)
Neutrophils Relative %: 52 %
Platelet Count: 185 10*3/uL (ref 150–400)
RBC: 4.03 MIL/uL (ref 3.87–5.11)
RDW: 13.2 % (ref 11.5–15.5)
WBC Count: 5.6 10*3/uL (ref 4.0–10.5)
nRBC: 0 % (ref 0.0–0.2)

## 2019-01-19 MED ORDER — SODIUM CHLORIDE 0.9 % IV SOLN
Freq: Once | INTRAVENOUS | Status: AC
  Administered 2019-01-19: 11:00:00 via INTRAVENOUS
  Filled 2019-01-19: qty 250

## 2019-01-19 MED ORDER — SODIUM CHLORIDE 0.9 % IV SOLN
420.0000 mg | Freq: Once | INTRAVENOUS | Status: AC
  Administered 2019-01-19: 12:00:00 420 mg via INTRAVENOUS
  Filled 2019-01-19: qty 14

## 2019-01-19 MED ORDER — ACETAMINOPHEN 325 MG PO TABS
650.0000 mg | ORAL_TABLET | Freq: Once | ORAL | Status: AC
  Administered 2019-01-19: 650 mg via ORAL

## 2019-01-19 MED ORDER — SODIUM CHLORIDE 0.9 % IV SOLN
Freq: Once | INTRAVENOUS | Status: DC
  Filled 2019-01-19: qty 250

## 2019-01-19 MED ORDER — DIPHENHYDRAMINE HCL 25 MG PO CAPS
50.0000 mg | ORAL_CAPSULE | Freq: Once | ORAL | Status: AC
  Administered 2019-01-19: 11:00:00 50 mg via ORAL

## 2019-01-19 MED ORDER — SODIUM CHLORIDE 0.9 % IV SOLN
3.6000 mg/kg | Freq: Once | INTRAVENOUS | Status: AC
  Administered 2019-01-19: 220 mg via INTRAVENOUS
  Filled 2019-01-19: qty 11

## 2019-01-19 MED ORDER — DIPHENHYDRAMINE HCL 25 MG PO CAPS
ORAL_CAPSULE | ORAL | Status: AC
  Filled 2019-01-19: qty 2

## 2019-01-19 MED ORDER — ACETAMINOPHEN 325 MG PO TABS
ORAL_TABLET | ORAL | Status: AC
  Filled 2019-01-19: qty 2

## 2019-01-19 NOTE — Telephone Encounter (Signed)
Scheduled appt per 9/11 los

## 2019-01-19 NOTE — Patient Instructions (Signed)
[facility] Discharge Instructions for Patients Receiving Chemotherapy  Today you received the following chemotherapy agents:  Kadcyla, Perjeta  To help prevent nausea and vomiting after your treatment, we encourage you to take your nausea medication as prescribed.   If you develop nausea and vomiting that is not controlled by your nausea medication, call the clinic.   BELOW ARE SYMPTOMS THAT SHOULD BE REPORTED IMMEDIATELY:  *FEVER GREATER THAN 100.5 F  *CHILLS WITH OR WITHOUT FEVER  NAUSEA AND VOMITING THAT IS NOT CONTROLLED WITH YOUR NAUSEA MEDICATION  *UNUSUAL SHORTNESS OF BREATH  *UNUSUAL BRUISING OR BLEEDING  TENDERNESS IN MOUTH AND THROAT WITH OR WITHOUT PRESENCE OF ULCERS  *URINARY PROBLEMS  *BOWEL PROBLEMS  UNUSUAL RASH Items with * indicate a potential emergency and should be followed up as soon as possible.  Feel free to call the clinic should you have any questions or concerns. The clinic phone number is [phone].  Please show the [place] at check-in to the Emergency Department and triage nurse.   Coronavirus (COVID-19) Are you at risk?  Are you at risk for the Coronavirus (COVID-19)?  To be considered HIGH RISK for Coronavirus (COVID-19), you have to meet the following criteria:  . Traveled to [place], [place], [place], [place] or [place]; or in the [place] to [place], [place], [place], or [place]; and have fever, cough, and shortness of breath within the last 2 weeks of travel OR . Been in close contact with a person diagnosed with COVID-19 within the last 2 weeks and have fever, cough, and shortness of breath . IF YOU DO NOT MEET THESE CRITERIA, YOU ARE CONSIDERED LOW RISK FOR COVID-19.  What to do if you are HIGH RISK for COVID-19?  Marland Kitchen If you are having a medical emergency, call 911. . Seek medical care right away. Before you go to a doctor's office, urgent care or emergency department, call ahead and tell them  about your recent travel, contact with someone diagnosed with COVID-19, and your symptoms. You should receive instructions from your physician's office regarding next steps of care.  . When you arrive at healthcare provider, tell the healthcare staff immediately you have returned from visiting [place], [place], [place], [place] or [place]; or traveled in the [place] to [place], [place], [place], or [place]; in the last two weeks or you have been in close contact with a person diagnosed with COVID-19 in the last 2 weeks.   . Tell the health care staff about your symptoms: fever, cough and shortness of breath. . After you have been seen by a medical provider, you will be either: o Tested for (COVID-19) and discharged home on quarantine except to seek medical care if symptoms worsen, and asked to  - Stay home and avoid contact with others until you get your results (4-5 days)  - Avoid travel on public transportation if possible (such as bus, train, or airplane) or o Sent to the Emergency Department by EMS for evaluation, COVID-19 testing, and possible admission depending on your condition and test results.  What to do if you are LOW RISK for COVID-19?  Reduce your risk of any infection by using the same precautions used for avoiding the common cold or flu:  Marland Kitchen Wash your hands often with soap and warm water for at least 20 seconds.  If soap and water are not readily available, use an alcohol-based hand sanitizer with at least 60% alcohol.  . If  coughing or sneezing, cover your mouth and nose by coughing or sneezing into the elbow areas of your shirt or coat, into a tissue or into your sleeve (not your hands). . Avoid shaking hands with others and consider head nods or verbal greetings only. . Avoid touching your eyes, nose, or mouth with unwashed hands.  . Avoid close contact with people who are sick. . Avoid places or events with large numbers of people in one location, like concerts or  sporting events. . Carefully consider travel plans you have or are making. . If you are planning any travel outside or inside the [place], visit the CDC's Travelers' Health webpage for the latest health notices. . If you have some symptoms but not all symptoms, continue to monitor at home and seek medical attention if your symptoms worsen. . If you are having a medical emergency, call 911.   [place] / e-Visit: [email or web address]         [facility]: [facility]: [phone]                   [facility]: [phone]

## 2019-01-20 LAB — CANCER ANTIGEN 27.29: CA 27.29: 33.7 U/mL (ref 0.0–38.6)

## 2019-01-22 ENCOUNTER — Other Ambulatory Visit: Payer: Self-pay | Admitting: Hematology

## 2019-01-22 DIAGNOSIS — C50412 Malignant neoplasm of upper-outer quadrant of left female breast: Secondary | ICD-10-CM

## 2019-02-06 NOTE — Progress Notes (Signed)
[place]   Telephone:(336) [phone] Fax:([phone]) [phone]   Clinic Follow up Note   Patient Care Team: [name], MD as PCP - General (Family Medicine)  Date of Service:  02/09/2019  CHIEF COMPLAINT: F/u left breast cancer  SUMMARY OF ONCOLOGIC HISTORY: Oncology History Overview Note  Cancer Staging Malignant neoplasm of upper-outer quadrant of left breast in female, estrogen receptor negative ([place]) Staging form: Breast, AJCC 8th Edition - Clinical stage from 10/11/2018: Stage IIB (cT2, cN1, cM0, G3, ER-, PR-, HER2+) - Signed by Truitt Merle, MD on 11/02/2018 - Clinical: No stage assigned - Unsigned    [name], stage II  09/2005 Initial Diagnosis   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 Findings were a 5.9 x 3.8 x 1.2 cm moderate to well-differentiated adenocarcinoma penetrating the muscular wall. Forty lymph nodes negative. No vascular or lymphatic invasion. Multiple diverticula with microabscess formation and inflammation. Carcinoma present in at least 1 diverticulum. Preop CEA 5.2 with lab normal range 0 to 2.5. Preop CT scan with no obvious additional pathology.    2007 -  Chemotherapy   She received oral Xeloda chemotherapy as an adjuvant. She declined treatment on a clinical trial.    11/12/2011 Initial Diagnosis   [name], stage II   12/09/2011 Procedure   Followup colonoscopy done on 12/09/2011. She was found to have 2 polyps which were removed. Chronic diverticulosis.     Malignant neoplasm of upper-outer quadrant of left breast in female, estrogen receptor negative ([place])  09/26/2018 Mammogram   Mammogram/US of left breast 09/26/18 IMPRESSION:  1. 2.9cm irregular mass in the upper outer left breast corresponds to the palpable abnormality. This is highly suspicious for breast carcinoma.  2. Two adjacent abnormal left axillary  Lymph nodes  suspicious for metastatic adenopathy. There is a third borderline abnormal left axillary LN with a cortex thickened to 7m.  3. Benign right breast cyst. No evidence of right breast malignancy.    10/11/2018 Cancer Staging   Staging form: Breast, AJCC 8th Edition - Clinical stage from 10/11/2018: Stage IIB (cT2, cN1, cM0, G3, ER-, PR-, HER2+) - Signed by [name] MD on 11/02/2018   10/11/2018 Initial Biopsy   Diagnosis 10/11/18 1. Breast, left, needle core biopsy, upper outer left 2 o'clock - INVASIVE DUCTAL CARCINOMA. - DUCTAL CARCINOMA IN SITU. - LYMPHOVASCULAR INVASION IS IDENTIFIED. - SEE COMMENT. 2. Lymph node, needle/core biopsy, left axilla - INVASIVE DUCTAL CARCINOMA. - SEE COMMENT.   10/11/2018 Receptors her2   The tumor cells are POSITIVE for Her2 (3+). Estrogen Receptor: 0%, NEGATIVE Progesterone Receptor: 0%, NEGATIVE Proliferation Marker Ki67: 70%   11/02/2018 Initial Diagnosis   Malignant neoplasm of upper-outer quadrant of left breast in female, estrogen receptor negative [place]   11/15/2018 Breast MRI   MRI breast 11/15/18  IMPRESSION: 1. 3.3 centimeter mass in the LATERAL portion of the LEFT breast consistent with known malignancy. 2. There is significant non mass enhancement surrounding this mass and extending anteriorly into the nipple base, with largest diameter in the anterior to posterior axis, measuring 7.2 centimeters. 3. If the patient would consider breast conservation, additional MR guided core biopsies are recommended. Consider biopsy of the inferior and anterior extent of the non mass enhancement to document extent of disease. 4. Three enlarged LEFT axillary lymph nodes. 5. RIGHT breast is negative.   11/15/2018 PET scan   PET 11/15/18  IMPRESSION: Hypermetabolic left breast lesion compatible with known primary. Hypermetabolic left axillary lymph nodes are consistent with metastatic disease.   No evidence for additional hypermetabolic metastatic involvement  in the neck, chest, abdomen, or pelvis.   11/17/2018 -  Chemotherapy   Neo-adjuvant Kadcyla and perjeta q3weeks for 6 cycles starting 11/17/18, will continue Perjeta for 1 year total treatment.    11/29/2018 Pathology Results   Diagnosis 1. Breast, left, needle core biopsy, inferior anterior (cylinder clip) - INVASIVE DUCTAL CARCINOMA. - DUCTAL CARCINOMA IN SITU. - LYMPHOVASCULAR INVASION IS IDENTIFIED. - SEE COMMENT. 2. Breast, left, needle core biopsy, central posterior (barbell clip) - INVASIVE DUCTAL CARCINOMA. - LYMPHOVASCULAR INVASION IS IDENTIFIED.      CURRENT THERAPY:  Neoadjuvant Kadcylaand perjetaq3weeks starting 11/17/18  INTERVAL HISTORY:  [name] is here for a follow up and treatment. She presents to the clinic alone. She notes it is taking time getting her taste back but she is still able to eat well. She plans to see Dr. [name] in 2-3 weeks. She plans to have breast MRI next week. She wishes to continue Friday visits as early as possible in the day given she does not want to get home late. She has not had flu shot yet this year.    REVIEW OF SYSTEMS:   Constitutional: Denies fevers, chills or abnormal weight loss Eyes: Denies blurriness of vision Ears, nose, mouth, throat, and face: Denies mucositis or sore throat Respiratory: Denies cough, dyspnea or wheezes Cardiovascular: Denies palpitation, chest discomfort or lower extremity swelling Gastrointestinal:  Denies nausea, heartburn or change in bowel habits Skin: Denies abnormal skin rashes Lymphatics: Denies new lymphadenopathy or easy bruising Neurological:Denies numbness, tingling or new weaknesses Behavioral/Psych: Mood is stable, no new changes  All other systems were reviewed with the patient and are negative.  MEDICAL HISTORY:  Past Medical History:  Diagnosis Date  . [name], stage II 11/12/2011   Sigmoid lesion 5.9 cm  40 nodes negative but focus of cancer in a diverticum   Pre-op CEA  5.2 with lab normal up to 2.5 resected 09/21/05   Xeloda adjuvant chemotherapy  . Hypertension     SURGICAL HISTORY: Past Surgical History:  Procedure Laterality Date  . HEMICOLECTOMY  2007    I have reviewed the social history and family history with the patient and they are unchanged from previous note.  ALLERGIES:  has No Known Allergies.  MEDICATIONS:  Current Outpatient Medications  Medication Sig Dispense Refill  . amLODipine (NORVASC) 5 MG tablet Take by mouth.    . calcium citrate-vitamin D (CITRACAL+D) 315-200 MG-UNIT per tablet Take 1 tablet by mouth daily.    . fish oil-omega-3 fatty acids 1000 MG capsule Take 1 g by mouth daily.    . ondansetron (ZOFRAN) 8 MG tablet Take 1 tablet (8 mg total) by mouth every 8 (eight) hours as needed for nausea or vomiting. 20 tablet 2   Current Facility-Administered Medications  Medication Dose Route Frequency Provider Last Rate Last Dose  . influenza vaccine adjuvanted (FLUAD) injection 0.5 mL  0.5 mL Intramuscular Once Truitt Merle, MD       Facility-Administered Medications Ordered in Other Visits  Medication Dose Route Frequency Provider Last Rate Last Dose  . ado-trastuzumab emtansine (KADCYLA) 220 mg in sodium chloride 0.9 % 250 mL chemo infusion  3.6 mg/kg (Treatment Plan Recorded) Intravenous Once Truitt Merle, MD      . pertuzumab (PERJETA) 420 mg in sodium chloride 0.9 % 250 mL chemo  infusion  420 mg Intravenous Once Truitt Merle, MD        PHYSICAL EXAMINATION: ECOG PERFORMANCE STATUS: 1 - Symptomatic but completely ambulatory  Vitals:   02/09/19 0921  BP: (!) 199/74  Pulse: 88  Resp: 18  Temp: 98.7 F (37.1 C)  SpO2: 96%   Filed Weights   02/09/19 0921  Weight: 132 lb 3.2 oz (60 kg)    GENERAL:alert, no distress and comfortable SKIN: skin color, texture, turgor are normal, no rashes or significant lesions EYES: normal, Conjunctiva are pink and non-injected, sclera clear  NECK: supple, thyroid normal size, non-tender,  without nodularity LYMPH:  no palpable lymphadenopathy in the cervical, axillary  LUNGS: clear to auscultation and percussion with normal breathing effort HEART: regular rate & rhythm and no murmurs and no lower extremity edema ABDOMEN:abdomen soft, non-tender and normal bowel sounds Musculoskeletal:no cyanosis of digits and no clubbing  NEURO: alert & oriented x 3 with fluent speech, no focal motor/sensory deficits  LABORATORY DATA:  I have reviewed the data as listed CBC Latest Ref Rng & Units 02/09/2019 01/19/2019 12/29/2018  WBC 4.0 - 10.5 K/uL 6.5 5.6 5.4  Hemoglobin 12.0 - 15.0 g/dL 13.6 12.9 13.2  Hematocrit 36.0 - 46.0 % 40.4 37.7 39.6  Platelets 150 - 400 K/uL 182 185 182     CMP Latest Ref Rng & Units 02/09/2019 01/19/2019 12/29/2018  Glucose 70 - 99 mg/dL 102(H) 111(H) 89  BUN 8 - 23 mg/dL 20 19 23   Creatinine 0.44 - 1.00 mg/dL 0.76 0.84 0.80  Sodium 135 - 145 mmol/L 141 138 140  Potassium 3.5 - 5.1 mmol/L 4.0 4.1 3.9  Chloride 98 - 111 mmol/L 107 105 107  CO2 22 - 32 mmol/L 24 25 22   Calcium 8.9 - 10.3 mg/dL 9.8 9.5 9.4  Total Protein 6.5 - 8.1 g/dL 7.8 7.5 7.5  Total Bilirubin 0.3 - 1.2 mg/dL 0.4 0.4 0.5  Alkaline Phos 38 - 126 U/L 98 92 87  AST 15 - 41 U/L 54(H) 52(H) 47(H)  ALT 0 - 44 U/L 43 40 36      RADIOGRAPHIC STUDIES: I have personally reviewed the radiological images as listed and agreed with the findings in the report. No results found.   ASSESSMENT & PLAN:  Jasmin Lewis is a 85 y.o. 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 2 abnormal LNs were seen on [place], 1 was biopsied.Her tumor is negative for ER and PR, HER2 positive, which is more aggressive -staging PET was negative  for distant metastasis -Istarted her onneoadjuvant andadjuvant chemo T-DM1 (Kadcyla) and Perjetafor a total 1 year treatmenton 11/17/18.Shetoleratedmoderatelywell withmildweight loss and fatigue. -Her breast mass is getting smaller on exam, indicating good clinical response to treatment.  -Plan to complete 6 cycles of Kadcyla before proceeding with surgery.  -She is apprehensive of PAC placement. She would like to continue peripheral access for as long as she can tolerate. This is understandable.  -Labs reviewed, CBC and CMP WNL except AST 54. Overall adequate to proceed with C5 Kadcyla/Perjeta today  -Her Breast MRI is set for 02/12/19. She will f/u with Dr. [name] in 2-3 weeks about surgery.  -I discussed if scan shows mild to moderate response to chemo, we may continue for 2-3 more cycles.   -F/u with last cycle  treatment in 3 weeks  -She opted to receive flu shot today    2. Low appetite, weight loss -S/p cycle 1 she has lost 7 pounds, less weight loss lately -Iagainencouragedher to increase her calorie and proteinintake. She can use ensure boost to help if she cannot do so with food intake. I also encouraged her to drink enough water and liquids to stay hydrated. -Continue to f/u with dietician -Will monitor.Although taste takes longer to return, her weight has remained stable.   3. H/o stage IIadenocarcinoma of the sigmoid colon diagnosed in May 2007 -S/p surgicalresectionon 09/21/05.S/p adjuvant Xeloda.   4. Genetic Testing  -Given her history of cancer and sister had colon cancer, I recommend genetic testing to rule out inheritable mutations. She is interested. -Genetic referralpreviouslysent  5. HTN, B/lLEpitting edema  -Recently started on Amlodipine  -Edema resolved after a few weeks. Itis probably related to amlodipine.  -She is nervous when she is at doctor's office. She notes her BP is much better at home.  -BP at 199/74, will repeat in  infusion room (02/09/19)   PLAN: -flu shot today  -Lab reviewed and adequate to proceed with C5Kadcyla and Perjeta today -Breast MRI on 10/5, I will call her to discuss her results  -Lab, f/u and last Kadcyla/perjeta in 3 weeks   No problem-specific Assessment & Plan notes found for this encounter.   [name].  All questions were answered. The patient knows to call the clinic with any problems, questions or concerns. No barriers to learning was detected. I spent 20 minutes counseling the patient face to face. The total time spent in the appointment was 25 minutes and more than 50% was on counseling and review of test results     Truitt Merle, MD 02/09/2019   I, Joslyn Devon, am acting as scribe for Truitt Merle, MD.   I have reviewed the above documentation for accuracy and completeness, and I agree with the above.

## 2019-02-09 ENCOUNTER — Inpatient Hospital Stay (HOSPITAL_BASED_OUTPATIENT_CLINIC_OR_DEPARTMENT_OTHER): Payer: Medicare Other | Admitting: Hematology

## 2019-02-09 ENCOUNTER — Encounter: Payer: Self-pay | Admitting: Hematology

## 2019-02-09 ENCOUNTER — Inpatient Hospital Stay: Payer: Medicare Other

## 2019-02-09 ENCOUNTER — Inpatient Hospital Stay: Payer: Medicare Other | Attending: Hematology

## 2019-02-09 ENCOUNTER — Other Ambulatory Visit: Payer: Self-pay

## 2019-02-09 VITALS — BP 150/61

## 2019-02-09 VITALS — BP 199/74 | HR 88 | Temp 98.7°F | Resp 18 | Ht 63.0 in | Wt 132.2 lb

## 2019-02-09 DIAGNOSIS — Z79899 Other long term (current) drug therapy: Secondary | ICD-10-CM | POA: Insufficient documentation

## 2019-02-09 DIAGNOSIS — Z171 Estrogen receptor negative status [ER-]: Secondary | ICD-10-CM | POA: Diagnosis not present

## 2019-02-09 DIAGNOSIS — Z5112 Encounter for antineoplastic immunotherapy: Secondary | ICD-10-CM | POA: Insufficient documentation

## 2019-02-09 DIAGNOSIS — Z23 Encounter for immunization: Secondary | ICD-10-CM

## 2019-02-09 DIAGNOSIS — C773 Secondary and unspecified malignant neoplasm of axilla and upper limb lymph nodes: Secondary | ICD-10-CM | POA: Diagnosis not present

## 2019-02-09 DIAGNOSIS — C50412 Malignant neoplasm of upper-outer quadrant of left female breast: Secondary | ICD-10-CM

## 2019-02-09 DIAGNOSIS — Z85038 Personal history of other malignant neoplasm of large intestine: Secondary | ICD-10-CM | POA: Diagnosis not present

## 2019-02-09 DIAGNOSIS — I10 Essential (primary) hypertension: Secondary | ICD-10-CM | POA: Insufficient documentation

## 2019-02-09 DIAGNOSIS — R634 Abnormal weight loss: Secondary | ICD-10-CM | POA: Insufficient documentation

## 2019-02-09 DIAGNOSIS — R5383 Other fatigue: Secondary | ICD-10-CM | POA: Insufficient documentation

## 2019-02-09 LAB — CBC WITH DIFFERENTIAL (CANCER CENTER ONLY)
Abs Immature Granulocytes: 0.01 10*3/uL (ref 0.00–0.07)
Basophils Absolute: 0 10*3/uL (ref 0.0–0.1)
Basophils Relative: 1 %
Eosinophils Absolute: 0.1 10*3/uL (ref 0.0–0.5)
Eosinophils Relative: 2 %
HCT: 40.4 % (ref 36.0–46.0)
Hemoglobin: 13.6 g/dL (ref 12.0–15.0)
Immature Granulocytes: 0 %
Lymphocytes Relative: 37 %
Lymphs Abs: 2.4 10*3/uL (ref 0.7–4.0)
MCH: 31.4 pg (ref 26.0–34.0)
MCHC: 33.7 g/dL (ref 30.0–36.0)
MCV: 93.3 fL (ref 80.0–100.0)
Monocytes Absolute: 0.7 10*3/uL (ref 0.1–1.0)
Monocytes Relative: 11 %
Neutro Abs: 3.2 10*3/uL (ref 1.7–7.7)
Neutrophils Relative %: 49 %
Platelet Count: 182 10*3/uL (ref 150–400)
RBC: 4.33 MIL/uL (ref 3.87–5.11)
RDW: 13.2 % (ref 11.5–15.5)
WBC Count: 6.5 10*3/uL (ref 4.0–10.5)
nRBC: 0 % (ref 0.0–0.2)

## 2019-02-09 LAB — CMP (CANCER CENTER ONLY)
ALT: 43 U/L (ref 0–44)
AST: 54 U/L — ABNORMAL HIGH (ref 15–41)
Albumin: 4 g/dL (ref 3.5–5.0)
Alkaline Phosphatase: 98 U/L (ref 38–126)
Anion gap: 10 (ref 5–15)
BUN: 20 mg/dL (ref 8–23)
CO2: 24 mmol/L (ref 22–32)
Calcium: 9.8 mg/dL (ref 8.9–10.3)
Chloride: 107 mmol/L (ref 98–111)
Creatinine: 0.76 mg/dL (ref 0.44–1.00)
GFR, Est AFR Am: >60 mL/min (ref >60)
GFR, Estimated: >60 mL/min (ref >60)
Glucose, Bld: 102 mg/dL — ABNORMAL HIGH (ref 70–99)
Potassium: 4 mmol/L (ref 3.5–5.1)
Sodium: 141 mmol/L (ref 135–145)
Total Bilirubin: 0.4 mg/dL (ref 0.3–1.2)
Total Protein: 7.8 g/dL (ref 6.5–8.1)

## 2019-02-09 MED ORDER — ACETAMINOPHEN 325 MG PO TABS
650.0000 mg | ORAL_TABLET | Freq: Once | ORAL | Status: AC
  Administered 2019-02-09: 11:00:00 650 mg via ORAL

## 2019-02-09 MED ORDER — SODIUM CHLORIDE 0.9 % IV SOLN
420.0000 mg | Freq: Once | INTRAVENOUS | Status: AC
  Administered 2019-02-09: 420 mg via INTRAVENOUS
  Filled 2019-02-09: qty 14

## 2019-02-09 MED ORDER — ACETAMINOPHEN 325 MG PO TABS
ORAL_TABLET | ORAL | Status: AC
  Filled 2019-02-09: qty 2

## 2019-02-09 MED ORDER — DIPHENHYDRAMINE HCL 25 MG PO CAPS
ORAL_CAPSULE | ORAL | Status: AC
  Filled 2019-02-09: qty 2

## 2019-02-09 MED ORDER — INFLUENZA VAC A&B SA ADJ QUAD 0.5 ML IM PRSY: 0.5000 mL | PREFILLED_SYRINGE | Freq: Once | INTRAMUSCULAR | Status: DC

## 2019-02-09 MED ORDER — SODIUM CHLORIDE 0.9 % IV SOLN
Freq: Once | INTRAVENOUS | Status: AC
  Administered 2019-02-09: 10:00:00 via INTRAVENOUS
  Filled 2019-02-09: qty 250

## 2019-02-09 MED ORDER — DIPHENHYDRAMINE HCL 25 MG PO CAPS
50.0000 mg | ORAL_CAPSULE | Freq: Once | ORAL | Status: AC
  Administered 2019-02-09: 11:00:00 50 mg via ORAL

## 2019-02-09 MED ORDER — SODIUM CHLORIDE 0.9 % IV SOLN
3.6000 mg/kg | Freq: Once | INTRAVENOUS | Status: AC
  Administered 2019-02-09: 220 mg via INTRAVENOUS
  Filled 2019-02-09: qty 5

## 2019-02-09 NOTE — Patient Instructions (Addendum)
[facility] Discharge Instructions for Patients Receiving Chemotherapy  Today you received the following chemotherapy agents:  Ado-trastuzumab emtansine, pertuzumab  To help prevent nausea and vomiting after your treatment, we encourage you to take your nausea medication as prescribed.   If you develop nausea and vomiting that is not controlled by your nausea medication, call the clinic.   BELOW ARE SYMPTOMS THAT SHOULD BE REPORTED IMMEDIATELY:  *FEVER GREATER THAN 100.5 F  *CHILLS WITH OR WITHOUT FEVER  NAUSEA AND VOMITING THAT IS NOT CONTROLLED WITH YOUR NAUSEA MEDICATION  *UNUSUAL SHORTNESS OF BREATH  *UNUSUAL BRUISING OR BLEEDING  TENDERNESS IN MOUTH AND THROAT WITH OR WITHOUT PRESENCE OF ULCERS  *URINARY PROBLEMS  *BOWEL PROBLEMS  UNUSUAL RASH Items with * indicate a potential emergency and should be followed up as soon as possible.  Feel free to call the clinic should you have any questions or concerns. The clinic phone number is [phone].  Please show the [facility] at check-in to the Emergency Department and triage nurse.

## 2019-02-12 ENCOUNTER — Telehealth: Payer: Self-pay | Admitting: Hematology

## 2019-02-12 ENCOUNTER — Other Ambulatory Visit: Payer: Self-pay | Admitting: Hematology

## 2019-02-12 ENCOUNTER — Ambulatory Visit
Admission: RE | Admit: 2019-02-12 | Discharge: 2019-02-12 | Disposition: A | Discharge status: Home / Self Care | Payer: Medicare Other | Source: Ambulatory Visit | Attending: Hematology | Admitting: Hematology

## 2019-02-12 DIAGNOSIS — Z171 Estrogen receptor negative status [ER-]: Secondary | ICD-10-CM

## 2019-02-12 DIAGNOSIS — C50412 Malignant neoplasm of upper-outer quadrant of left female breast: Secondary | ICD-10-CM

## 2019-02-12 MED ORDER — GADOBUTROL 1 MMOL/ML IV SOLN
6.0000 mL | Freq: Once | INTRAVENOUS | Status: AC | PRN
  Administered 2019-02-12: 12:00:00 6 mL via INTRAVENOUS

## 2019-02-12 MED ORDER — GADOBUTROL 1 MMOL/ML IV SOLN: 6.0000 mL | Freq: Once | INTRAVENOUS | Status: DC | PRN

## 2019-02-12 NOTE — Telephone Encounter (Signed)
No los per 10/2. °

## 2019-02-17 ENCOUNTER — Other Ambulatory Visit: Payer: Medicare Other

## 2019-02-27 ENCOUNTER — Ambulatory Visit: Payer: Self-pay | Admitting: General Surgery

## 2019-02-27 DIAGNOSIS — C50412 Malignant neoplasm of upper-outer quadrant of left female breast: Secondary | ICD-10-CM

## 2019-02-28 ENCOUNTER — Other Ambulatory Visit: Payer: Self-pay | Admitting: General Surgery

## 2019-02-28 DIAGNOSIS — C50412 Malignant neoplasm of upper-outer quadrant of left female breast: Secondary | ICD-10-CM

## 2019-03-01 ENCOUNTER — Encounter: Payer: Self-pay | Admitting: Nurse Practitioner

## 2019-03-01 ENCOUNTER — Other Ambulatory Visit: Payer: Self-pay

## 2019-03-01 ENCOUNTER — Other Ambulatory Visit: Payer: Self-pay | Admitting: Hematology

## 2019-03-01 ENCOUNTER — Ambulatory Visit: Payer: Medicare Other | Admitting: Nurse Practitioner

## 2019-03-01 ENCOUNTER — Inpatient Hospital Stay (HOSPITAL_BASED_OUTPATIENT_CLINIC_OR_DEPARTMENT_OTHER): Payer: Medicare Other | Admitting: Nurse Practitioner

## 2019-03-01 ENCOUNTER — Inpatient Hospital Stay: Payer: Medicare Other

## 2019-03-01 ENCOUNTER — Other Ambulatory Visit: Payer: Medicare Other

## 2019-03-01 ENCOUNTER — Ambulatory Visit: Payer: Medicare Other

## 2019-03-01 VITALS — BP 169/71 | HR 59 | Temp 98.7°F | Resp 17 | Ht 63.0 in | Wt 130.5 lb

## 2019-03-01 VITALS — BP 176/73 | HR 80 | Temp 97.8°F | Resp 18

## 2019-03-01 DIAGNOSIS — Z171 Estrogen receptor negative status [ER-]: Secondary | ICD-10-CM

## 2019-03-01 DIAGNOSIS — C50412 Malignant neoplasm of upper-outer quadrant of left female breast: Secondary | ICD-10-CM

## 2019-03-01 DIAGNOSIS — Z5112 Encounter for antineoplastic immunotherapy: Secondary | ICD-10-CM | POA: Diagnosis not present

## 2019-03-01 LAB — CMP (CANCER CENTER ONLY)
ALT: 38 U/L (ref 0–44)
AST: 52 U/L — ABNORMAL HIGH (ref 15–41)
Albumin: 3.9 g/dL (ref 3.5–5.0)
Alkaline Phosphatase: 90 U/L (ref 38–126)
Anion gap: 9 (ref 5–15)
BUN: 18 mg/dL (ref 8–23)
CO2: 24 mmol/L (ref 22–32)
Calcium: 9.8 mg/dL (ref 8.9–10.3)
Chloride: 106 mmol/L (ref 98–111)
Creatinine: 0.79 mg/dL (ref 0.44–1.00)
GFR, Est AFR Am: >60 mL/min (ref >60)
GFR, Estimated: >60 mL/min (ref >60)
Glucose, Bld: 107 mg/dL — ABNORMAL HIGH (ref 70–99)
Potassium: 3.9 mmol/L (ref 3.5–5.1)
Sodium: 139 mmol/L (ref 135–145)
Total Bilirubin: 0.4 mg/dL (ref 0.3–1.2)
Total Protein: 7.7 g/dL (ref 6.5–8.1)

## 2019-03-01 LAB — CBC WITH DIFFERENTIAL (CANCER CENTER ONLY)
Abs Immature Granulocytes: 0.03 10*3/uL (ref 0.00–0.07)
Basophils Absolute: 0.1 10*3/uL (ref 0.0–0.1)
Basophils Relative: 1 %
Eosinophils Absolute: 0.1 10*3/uL (ref 0.0–0.5)
Eosinophils Relative: 1 %
HCT: 42.8 % (ref 36.0–46.0)
Hemoglobin: 13.8 g/dL (ref 12.0–15.0)
Immature Granulocytes: 1 %
Lymphocytes Relative: 35 %
Lymphs Abs: 2 10*3/uL (ref 0.7–4.0)
MCH: 31.3 pg (ref 26.0–34.0)
MCHC: 32.2 g/dL (ref 30.0–36.0)
MCV: 97.1 fL (ref 80.0–100.0)
Monocytes Absolute: 0.8 10*3/uL (ref 0.1–1.0)
Monocytes Relative: 14 %
Neutro Abs: 2.7 10*3/uL (ref 1.7–7.7)
Neutrophils Relative %: 48 %
Platelet Count: 144 10*3/uL — ABNORMAL LOW (ref 150–400)
RBC: 4.41 MIL/uL (ref 3.87–5.11)
RDW: 13.5 % (ref 11.5–15.5)
WBC Count: 5.6 10*3/uL (ref 4.0–10.5)
nRBC: 0 % (ref 0.0–0.2)

## 2019-03-01 MED ORDER — DIPHENHYDRAMINE HCL 25 MG PO CAPS
ORAL_CAPSULE | ORAL | Status: AC
  Filled 2019-03-01: qty 2

## 2019-03-01 MED ORDER — ACETAMINOPHEN 325 MG PO TABS
650.0000 mg | ORAL_TABLET | Freq: Once | ORAL | Status: AC
  Administered 2019-03-01: 650 mg via ORAL

## 2019-03-01 MED ORDER — SODIUM CHLORIDE 0.9 % IV SOLN
420.0000 mg | Freq: Once | INTRAVENOUS | Status: AC
  Administered 2019-03-01: 420 mg via INTRAVENOUS
  Filled 2019-03-01: qty 14

## 2019-03-01 MED ORDER — DIPHENHYDRAMINE HCL 25 MG PO CAPS
50.0000 mg | ORAL_CAPSULE | Freq: Once | ORAL | Status: AC
  Administered 2019-03-01: 50 mg via ORAL

## 2019-03-01 MED ORDER — SODIUM CHLORIDE 0.9 % IV SOLN
Freq: Once | INTRAVENOUS | Status: AC
  Administered 2019-03-01: 11:00:00 via INTRAVENOUS
  Filled 2019-03-01: qty 250

## 2019-03-01 MED ORDER — ACETAMINOPHEN 325 MG PO TABS
ORAL_TABLET | ORAL | Status: AC
  Filled 2019-03-01: qty 2

## 2019-03-01 MED ORDER — SODIUM CHLORIDE 0.9 % IV SOLN
3.3000 mg/kg | Freq: Once | INTRAVENOUS | Status: AC
  Administered 2019-03-01: 200 mg via INTRAVENOUS
  Filled 2019-03-01: qty 10

## 2019-03-01 NOTE — Progress Notes (Signed)
Patient declined to wait 30 minute observation post Kadcyla. VSS. Discharged in no acute distress.

## 2019-03-01 NOTE — Progress Notes (Signed)
Jasmin Lewis   [phone] [phone]   Clinic Follow up Note   Patient Care Team: [name], MD as PCP - General (Family Medicine) 03/01/2019  CHIEF COMPLAINT: F/u left breast cancer   SUMMARY OF ONCOLOGIC HISTORY: Oncology History Overview Note  Cancer Staging Malignant neoplasm of upper-outer quadrant of left breast in female, estrogen receptor negative ([place]) Staging form: Breast, AJCC 8th Edition - Clinical stage from 10/11/2018: Stage IIB (cT2, cN1, cM0, G3, ER-, PR-, HER2+) - Signed by Truitt Merle, MD on 11/02/2018 - Clinical: No stage assigned - Unsigned    [name], stage II  09/2005 Initial Diagnosis   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 Findings were a 5.9 x 3.8 x 1.2 cm moderate to well-differentiated adenocarcinoma penetrating the muscular wall. Forty lymph nodes negative. No vascular or lymphatic invasion. Multiple diverticula with microabscess formation and inflammation. Carcinoma present in at least 1 diverticulum. Preop CEA 5.2 with lab normal range 0 to 2.5. Preop CT scan with no obvious additional pathology.    2007 -  Chemotherapy   She received oral Xeloda chemotherapy as an adjuvant. She declined treatment on a clinical trial.    11/12/2011 Initial Diagnosis   [name], stage II   12/09/2011 Procedure   Followup colonoscopy done on 12/09/2011. She was found to have 2 polyps which were removed. Chronic diverticulosis.     Malignant neoplasm of upper-outer quadrant of left breast in female, estrogen receptor negative ([place])  09/26/2018 Mammogram   Mammogram/US of left breast 09/26/18 IMPRESSION:  1. 2.9cm irregular mass in the upper outer left breast corresponds to the palpable abnormality. This is highly suspicious for breast carcinoma.  2. Two adjacent abnormal left axillary  Lymph nodes suspicious for  metastatic adenopathy. There is a third borderline abnormal left axillary LN with a cortex thickened to 57m.  3. Benign right breast cyst. No evidence of right breast malignancy.    10/11/2018 Cancer Staging   Staging form: Breast, AJCC 8th Edition - Clinical stage from 10/11/2018: Stage IIB (cT2, cN1, cM0, G3, ER-, PR-, HER2+) - Signed by [name] MD on 11/02/2018   10/11/2018 Initial Biopsy   Diagnosis 10/11/18 1. Breast, left, needle core biopsy, upper outer left 2 o'clock - INVASIVE DUCTAL CARCINOMA. - DUCTAL CARCINOMA IN SITU. - LYMPHOVASCULAR INVASION IS IDENTIFIED. - SEE COMMENT. 2. Lymph node, needle/core biopsy, left axilla - INVASIVE DUCTAL CARCINOMA. - SEE COMMENT.   10/11/2018 Receptors her2   The tumor cells are POSITIVE for Her2 (3+). Estrogen Receptor: 0%, NEGATIVE Progesterone Receptor: 0%, NEGATIVE Proliferation Marker Ki67: 70%   11/02/2018 Initial Diagnosis   Malignant neoplasm of upper-outer quadrant of left breast in [place], [place]   11/15/2018 Breast MRI   MRI breast 11/15/18  IMPRESSION: 1. 3.3 centimeter mass in the LATERAL portion of the LEFT breast consistent with known malignancy. 2. There is significant non mass enhancement surrounding this mass and extending anteriorly into the nipple base, with largest diameter in the anterior to posterior axis, measuring 7.2 centimeters. 3. If the patient would consider breast conservation, additional MR guided core biopsies are recommended. Consider biopsy of the inferior and anterior extent of the non mass enhancement to document extent of disease. 4. Three enlarged LEFT axillary lymph nodes. 5. RIGHT breast is negative.   11/15/2018 PET scan   PET 11/15/18 IMPRESSION: Hypermetabolic left breast  lesion compatible with known primary. Hypermetabolic left axillary lymph nodes are consistent with metastatic disease.   No evidence for additional hypermetabolic metastatic involvement in the neck,  chest, abdomen, or pelvis.   11/17/2018 -  Chemotherapy   Neo-adjuvant Kadcyla and perjeta q3weeks for 6 cycles starting 11/17/18, will continue Perjeta for 1 year total treatment.    11/29/2018 Pathology Results   Diagnosis 1. Breast, left, needle core biopsy, inferior anterior (cylinder clip) - INVASIVE DUCTAL CARCINOMA. - DUCTAL CARCINOMA IN SITU. - LYMPHOVASCULAR INVASION IS IDENTIFIED. - SEE COMMENT. 2. Breast, left, needle core biopsy, central posterior (barbell clip) - INVASIVE DUCTAL CARCINOMA. - LYMPHOVASCULAR INVASION IS IDENTIFIED.   02/12/2019 Breast MRI   IMPRESSION: 1. Complete resolution of previously identified enhancing mass and associated non mass enhancement within the left breast. This is consistent with excellent response to chemotherapy. No residual or suspicious findings are identified. 2. No MRI evidence of malignancy on the right. 3. Previously identified left axillary lymphadenopathy not definitively seen on today's study. However, this may be due to decreased field-of-view compared to prior study.       CURRENT THERAPY: NeoadjuvantKadcylaand perjetaq3weeks starting 11/17/18  INTERVAL HISTORY: [name] returns for f/u and treatment as scheduled. She underwent breast MRI on 02/12/19. She completed cycle 5 kadcyla and perjeta on 02/09/19. She tolerates treatment well. She has no specific complaints today. She has intermittent day time fatigue. Able to do all ADLs. Her appetite is normal but taste is decreased. No n/v/c/d. Denies fever, chills, cough, chest pain, dyspnea, leg swelling, rash. She cannot feel the lump in her breast. She met with Dr. [name] this week and decided on mastectomy.    MEDICAL HISTORY:  Past Medical History:  Diagnosis Date  . [name], stage II 11/12/2011   Sigmoid lesion 5.9 cm  40 nodes negative but focus of cancer in a diverticum   Pre-op CEA 5.2 with lab normal up to 2.5 resected 09/21/05   Xeloda adjuvant chemotherapy  .  Hypertension     SURGICAL HISTORY: Past Surgical History:  Procedure Laterality Date  . HEMICOLECTOMY  2007    I have reviewed the social history and family history with the patient and they are unchanged from previous note.  ALLERGIES:  has No Known Allergies.  MEDICATIONS:  Current Outpatient Medications  Medication Sig Dispense Refill  . amLODipine (NORVASC) 5 MG tablet Take by mouth.    . calcium citrate-vitamin D (CITRACAL+D) 315-200 MG-UNIT per tablet Take 1 tablet by mouth daily.    . fish oil-omega-3 fatty acids 1000 MG capsule Take 1 g by mouth daily.    . ondansetron (ZOFRAN) 8 MG tablet Take 1 tablet (8 mg total) by mouth every 8 (eight) hours as needed for nausea or vomiting. 20 tablet 2   No current facility-administered medications for this visit.    Facility-Administered Medications Ordered in Other Visits  Medication Dose Route Frequency Provider Last Rate Last Dose  . ado-trastuzumab emtansine (KADCYLA) 200 mg in sodium chloride 0.9 % 250 mL chemo infusion  3.3 mg/kg (Treatment Plan Recorded) Intravenous Once Truitt Merle, MD        PHYSICAL EXAMINATION: ECOG PERFORMANCE STATUS: 0 - Asymptomatic  Vitals:   03/01/19 1004  BP: (!) 169/71  Pulse: (!) 59  Resp: 17  Temp: 98.7 F (37.1 C)  SpO2: 100%   Filed Weights   03/01/19 1004  Weight: 130 lb 8 oz (59.2 kg)    GENERAL:alert, no distress and comfortable SKIN: no rash  EYES: sclera clear LYMPH:  no palpable axillary lymphadenopathy  LUNGS: clear with normal breathing effort HEART: regular rate & rhythm, no lower extremity edema NEURO: alert & oriented x 3 with fluent speech, normal gait Breast exam: The previously palpable mass in left breast 2:00 position is not palpable; right breast benign.   LABORATORY DATA:  I have reviewed the data as listed CBC Latest Ref Rng & Units 03/01/2019 02/09/2019 01/19/2019  WBC 4.0 - 10.5 K/uL 5.6 6.5 5.6  Hemoglobin 12.0 - 15.0 g/dL 13.8 13.6 12.9  Hematocrit 36.0  - 46.0 % 42.8 40.4 37.7  Platelets 150 - 400 K/uL 144(L) 182 185     CMP Latest Ref Rng & Units 03/01/2019 02/09/2019 01/19/2019  Glucose 70 - 99 mg/dL 107(H) 102(H) 111(H)  BUN 8 - 23 mg/dL 18 20 19  Creatinine 0.44 - 1.00 mg/dL 0.79 0.76 0.84  Sodium 135 - 145 mmol/L 139 141 138  Potassium 3.5 - 5.1 mmol/L 3.9 4.0 4.1  Chloride 98 - 111 mmol/L 106 107 105  CO2 22 - 32 mmol/L 24 24 25  Calcium 8.9 - 10.3 mg/dL 9.8 9.8 9.5  Total Protein 6.5 - 8.1 g/dL 7.7 7.8 7.5  Total Bilirubin 0.3 - 1.2 mg/dL 0.4 0.4 0.4  Alkaline Phos 38 - 126 U/L 90 98 92  AST 15 - 41 U/L 52(H) 54(H) 52(H)  ALT 0 - 44 U/L 38 43 40      RADIOGRAPHIC STUDIES: I have personally reviewed the radiological images as listed and agreed with the findings in the report. No results found.   ASSESSMENT & PLAN: Berlynn P Tellez is a 85 y.o. 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 2 abnormal LNs were seen on US, 1 was biopsied.Her tumor is negative for ER and PR, HER2 positive, which is more aggressive -staging PET was negative for distant metastasis -She beganneoadjuvant andadjuvant chemo T-DM1 (Kadcyla) and Perjeta(for total 1 year) on 11/17/18.she tolerated moderately well initially with mild weight loss and fatigue -s/p cycle 5 kadcyla and Perjeta, she tolerated 5th cycle well overall, less fatigue.  -Clinically her breast exam is not palpable, indicating good response to therapy  -I reviewed breast MRI from 02/12/19 which shows complete resolution of the left breast mass and non mass enhancement, consistent with complete radiographic response in the breast; the left axillary lymph node was not appreciable on the scan, but might be due to  limited field of view -She will proceed with left mastectomy on 03/14/19 per Dr. Toth -CBC and CMP today are stable, adequate to proceed with last cycle neoadjuvant kadcyla and continue perjeta q3 weeks -she will return for f/u in 4 weeks to review surgical pathology at which time she will likely continue adjuvant Perjeta to complete 1 year; adjuvant treatment plan will depend on final surgical path.   2. Low appetite, weight loss -S/p cycle 1 she has lost 7 pounds, less weight loss lately -weight loss has been mild and gradual on treatment  3. H/o stage IIadenocarcinoma of the sigmoid colon diagnosed in May 2007 -S/p surgicalresectionon 09/21/05.S/p adjuvant Xeloda.   4. Genetic Testing  -Given her history of cancer and sister had colon cancer, I recommend genetic testing to rule out inheritable mutations. She is interested. -Genetic referralpreviouslysent  5. HTN, B/lLEpitting edema  -Recently   started on Amlodipine  -edema resolved -BP 169/71 today  PLAN: -Labs, MRI reviewed showing excellent treatment response -Proceed with cycle 6 (last) Kadcyla today, continue q3 weeks perjeta  -Proceed with breast surgery per Dr. Toth on 11/4 -Return for f/u in 4 weeks to review final path and adjuvant treatment plan  All questions were answered. The patient knows to call the clinic with any problems, questions or concerns. No barriers to learning was detected. I spent 20 minutes counseling the patient face to face. The total time spent in the appointment was 25 minutes and more than 50% was on counseling and review of test results     Lacie K Burton, NP 03/01/19    

## 2019-03-01 NOTE — Patient Instructions (Signed)
[facility] Discharge Instructions for Patients Receiving Chemotherapy  Today you received the following chemotherapy agents: Perjeta, Kadcyla   To help prevent nausea and vomiting after your treatment, we encourage you to take your nausea medication as directed.    If you develop nausea and vomiting that is not controlled by your nausea medication, call the clinic.   BELOW ARE SYMPTOMS THAT SHOULD BE REPORTED IMMEDIATELY:  *FEVER GREATER THAN 100.5 F  *CHILLS WITH OR WITHOUT FEVER  NAUSEA AND VOMITING THAT IS NOT CONTROLLED WITH YOUR NAUSEA MEDICATION  *UNUSUAL SHORTNESS OF BREATH  *UNUSUAL BRUISING OR BLEEDING  TENDERNESS IN MOUTH AND THROAT WITH OR WITHOUT PRESENCE OF ULCERS  *URINARY PROBLEMS  *BOWEL PROBLEMS  UNUSUAL RASH Items with * indicate a potential emergency and should be followed up as soon as possible.  Feel free to call the clinic should you have any questions or concerns. The clinic phone number is [phone].  Please show the [facility] at check-in to the Emergency Department and triage nurse.

## 2019-03-02 ENCOUNTER — Telehealth: Payer: Self-pay | Admitting: Nurse Practitioner

## 2019-03-02 LAB — CANCER ANTIGEN 27.29: CA 27.29: 42 U/mL — ABNORMAL HIGH (ref 0.0–38.6)

## 2019-03-02 NOTE — Telephone Encounter (Signed)
Scheduled per los. Called and spoke with patient. Confirmed appt 

## 2019-03-10 ENCOUNTER — Other Ambulatory Visit (HOSPITAL_COMMUNITY)
Admission: RE | Admit: 2019-03-10 | Discharge: 2019-03-10 | Disposition: A | Discharge status: Home / Self Care | Payer: Medicare Other | Source: Ambulatory Visit | Attending: General Surgery | Admitting: General Surgery

## 2019-03-10 DIAGNOSIS — Z01812 Encounter for preprocedural laboratory examination: Secondary | ICD-10-CM | POA: Insufficient documentation

## 2019-03-10 DIAGNOSIS — Z20828 Contact with and (suspected) exposure to other viral communicable diseases: Secondary | ICD-10-CM | POA: Diagnosis not present

## 2019-03-11 LAB — NOVEL CORONAVIRUS, NAA (HOSP ORDER, SEND-OUT TO REF LAB; TAT 18-24 HRS): SARS-CoV-2, NAA: NOT DETECTED

## 2019-03-12 ENCOUNTER — Other Ambulatory Visit: Payer: Self-pay

## 2019-03-12 ENCOUNTER — Encounter (HOSPITAL_COMMUNITY): Payer: Self-pay | Admitting: *Deleted

## 2019-03-12 NOTE — Progress Notes (Signed)
Patient denies shortness of breath, fever, cough and chest pain.  PCP - Dr Alean Rinne Cardiologist -denies Oncology - Dr Truitt Merle   Chest x-ray - denies EKG - DOS 03/14/19 Stress Test - denies ECHO - 11/10/18 Cardiac Cath - denies  ERAS:Clears til 9 am DOS, no drink.  Anesthesia review: No  STOP now taking any Aspirin (unless otherwise instructed by your surgeon), Aleve, Naproxen, Ibuprofen, Motrin, Advil, Goody's, BC's, all herbal medications, fish oil, and all vitamins.   Coronavirus Screening Covid test on 03/10/19 was negative.  Patient verbalized understanding of instructions that were given via phone.

## 2019-03-14 ENCOUNTER — Ambulatory Visit
Admission: RE | Admit: 2019-03-14 | Discharge: 2019-03-14 | Disposition: A | Discharge status: Home / Self Care | Payer: Medicare Other | Source: Ambulatory Visit | Attending: General Surgery | Admitting: General Surgery

## 2019-03-14 ENCOUNTER — Ambulatory Visit (HOSPITAL_COMMUNITY): Payer: Medicare Other | Admitting: Anesthesiology

## 2019-03-14 ENCOUNTER — Other Ambulatory Visit: Payer: Self-pay

## 2019-03-14 ENCOUNTER — Encounter (HOSPITAL_COMMUNITY)
Admission: RE | Admit: 2019-03-14 | Discharge: 2019-03-14 | Disposition: A | Discharge status: Home / Self Care | Payer: Medicare Other | Source: Ambulatory Visit | Attending: General Surgery | Admitting: General Surgery

## 2019-03-14 ENCOUNTER — Other Ambulatory Visit: Payer: Self-pay | Admitting: General Surgery

## 2019-03-14 ENCOUNTER — Ambulatory Visit (HOSPITAL_COMMUNITY)
Admission: RE | Admit: 2019-03-14 | Discharge: 2019-03-15 | Disposition: A | Discharge status: Home / Self Care | Payer: Medicare Other | Attending: General Surgery | Admitting: General Surgery

## 2019-03-14 ENCOUNTER — Encounter (HOSPITAL_COMMUNITY)
Admission: RE | Disposition: A | Discharge status: Home / Self Care | Payer: Self-pay | Source: Home / Self Care | Attending: General Surgery

## 2019-03-14 ENCOUNTER — Encounter (HOSPITAL_COMMUNITY): Payer: Self-pay

## 2019-03-14 DIAGNOSIS — C50412 Malignant neoplasm of upper-outer quadrant of left female breast: Secondary | ICD-10-CM | POA: Diagnosis present

## 2019-03-14 DIAGNOSIS — I10 Essential (primary) hypertension: Secondary | ICD-10-CM | POA: Insufficient documentation

## 2019-03-14 DIAGNOSIS — Z171 Estrogen receptor negative status [ER-]: Secondary | ICD-10-CM | POA: Insufficient documentation

## 2019-03-14 DIAGNOSIS — Z79899 Other long term (current) drug therapy: Secondary | ICD-10-CM | POA: Insufficient documentation

## 2019-03-14 DIAGNOSIS — M1990 Unspecified osteoarthritis, unspecified site: Secondary | ICD-10-CM | POA: Insufficient documentation

## 2019-03-14 DIAGNOSIS — C50912 Malignant neoplasm of unspecified site of left female breast: Secondary | ICD-10-CM | POA: Diagnosis present

## 2019-03-14 HISTORY — PX: MASTECTOMY WITH AXILLARY LYMPH NODE DISSECTION: SHX5661

## 2019-03-14 HISTORY — DX: Unspecified osteoarthritis, unspecified site: M19.90

## 2019-03-14 HISTORY — PX: SENTINEL NODE BIOPSY: SHX6608

## 2019-03-14 HISTORY — DX: Malignant (primary) neoplasm, unspecified: C80.1

## 2019-03-14 SURGERY — MASTECTOMY WITH AXILLARY LYMPH NODE DISSECTION
Anesthesia: Regional | Site: Breast | Laterality: Left

## 2019-03-14 MED ORDER — LIDOCAINE 2% (20 MG/ML) 5 ML SYRINGE
INTRAMUSCULAR | Status: DC | PRN
  Administered 2019-03-14: 20 mg via INTRAVENOUS

## 2019-03-14 MED ORDER — PROPOFOL 10 MG/ML IV BOLUS
INTRAVENOUS | Status: DC | PRN
  Administered 2019-03-14: 20 mg via INTRAVENOUS
  Administered 2019-03-14: 100 mg via INTRAVENOUS
  Administered 2019-03-14: 20 mg via INTRAVENOUS

## 2019-03-14 MED ORDER — LACTATED RINGERS IV SOLN
INTRAVENOUS | Status: DC
  Administered 2019-03-14: 11:00:00 via INTRAVENOUS

## 2019-03-14 MED ORDER — BUPIVACAINE LIPOSOME 1.3 % IJ SUSP
INTRAMUSCULAR | Status: DC | PRN
  Administered 2019-03-14: 10 mL

## 2019-03-14 MED ORDER — ACETAMINOPHEN 500 MG PO TABS
ORAL_TABLET | ORAL | Status: AC
  Administered 2019-03-14: 1000 mg via ORAL
  Filled 2019-03-14: qty 2

## 2019-03-14 MED ORDER — HYDROCODONE-ACETAMINOPHEN 5-325 MG PO TABS: 1.0000 | ORAL_TABLET | ORAL | Status: DC | PRN

## 2019-03-14 MED ORDER — PROPOFOL 10 MG/ML IV BOLUS
INTRAVENOUS | Status: AC
  Filled 2019-03-14: qty 20

## 2019-03-14 MED ORDER — CELECOXIB 200 MG PO CAPS
ORAL_CAPSULE | ORAL | Status: AC
  Administered 2019-03-14: 200 mg via ORAL
  Filled 2019-03-14: qty 1

## 2019-03-14 MED ORDER — METHOCARBAMOL 500 MG PO TABS: 500.0000 mg | ORAL_TABLET | Freq: Four times a day (QID) | ORAL | Status: DC | PRN

## 2019-03-14 MED ORDER — 0.9 % SODIUM CHLORIDE (POUR BTL) OPTIME
TOPICAL | Status: DC | PRN
  Administered 2019-03-14: 12:00:00 1000 mL

## 2019-03-14 MED ORDER — HEPARIN SODIUM (PORCINE) 5000 UNIT/ML IJ SOLN
5000.0000 [IU] | Freq: Three times a day (TID) | INTRAMUSCULAR | Status: DC
  Administered 2019-03-15: 06:00:00 5000 [IU] via SUBCUTANEOUS
  Filled 2019-03-14: qty 1

## 2019-03-14 MED ORDER — GABAPENTIN 300 MG PO CAPS
ORAL_CAPSULE | ORAL | Status: AC
  Administered 2019-03-14: 300 mg via ORAL
  Filled 2019-03-14: qty 1

## 2019-03-14 MED ORDER — GABAPENTIN 300 MG PO CAPS
300.0000 mg | ORAL_CAPSULE | ORAL | Status: AC
  Administered 2019-03-14: 11:00:00 300 mg via ORAL

## 2019-03-14 MED ORDER — FENTANYL CITRATE (PF) 100 MCG/2ML IJ SOLN
INTRAMUSCULAR | Status: DC | PRN
  Administered 2019-03-14: 50 ug via INTRAVENOUS
  Administered 2019-03-14 (×2): 25 ug via INTRAVENOUS

## 2019-03-14 MED ORDER — KCL IN DEXTROSE-NACL 20-5-0.9 MEQ/L-%-% IV SOLN
INTRAVENOUS | Status: DC
  Administered 2019-03-15: 01:00:00 via INTRAVENOUS
  Filled 2019-03-14: qty 1000

## 2019-03-14 MED ORDER — AMLODIPINE BESYLATE 5 MG PO TABS
5.0000 mg | ORAL_TABLET | Freq: Every day | ORAL | Status: DC
  Administered 2019-03-15: 09:00:00 5 mg via ORAL
  Filled 2019-03-14: qty 1

## 2019-03-14 MED ORDER — ONDANSETRON HCL 4 MG/2ML IJ SOLN
INTRAMUSCULAR | Status: DC | PRN
  Administered 2019-03-14: 4 mg via INTRAVENOUS

## 2019-03-14 MED ORDER — ONDANSETRON 4 MG PO TBDP: 4.0000 mg | ORAL_TABLET | Freq: Four times a day (QID) | ORAL | Status: DC | PRN

## 2019-03-14 MED ORDER — FENTANYL CITRATE (PF) 250 MCG/5ML IJ SOLN
INTRAMUSCULAR | Status: AC
  Filled 2019-03-14: qty 5

## 2019-03-14 MED ORDER — TECHNETIUM TC 99M SULFUR COLLOID FILTERED
1.0000 | Freq: Once | INTRAVENOUS | Status: AC | PRN
  Administered 2019-03-14: 1 via INTRADERMAL

## 2019-03-14 MED ORDER — METHYLENE BLUE 0.5 % INJ SOLN
INTRAVENOUS | Status: AC
  Filled 2019-03-14: qty 10

## 2019-03-14 MED ORDER — PANTOPRAZOLE SODIUM 40 MG IV SOLR
40.0000 mg | Freq: Every day | INTRAVENOUS | Status: DC
  Administered 2019-03-14: 23:00:00 40 mg via INTRAVENOUS
  Filled 2019-03-14: qty 40

## 2019-03-14 MED ORDER — MIDAZOLAM HCL 2 MG/2ML IJ SOLN
INTRAMUSCULAR | Status: AC
  Filled 2019-03-14: qty 2

## 2019-03-14 MED ORDER — ACETAMINOPHEN 500 MG PO TABS
1000.0000 mg | ORAL_TABLET | ORAL | Status: AC
  Administered 2019-03-14: 11:00:00 1000 mg via ORAL

## 2019-03-14 MED ORDER — CHLORHEXIDINE GLUCONATE CLOTH 2 % EX PADS: 6.0000 | MEDICATED_PAD | Freq: Once | CUTANEOUS | Status: DC

## 2019-03-14 MED ORDER — ONDANSETRON HCL 4 MG/2ML IJ SOLN: 4.0000 mg | Freq: Four times a day (QID) | INTRAMUSCULAR | Status: DC | PRN

## 2019-03-14 MED ORDER — PHENYLEPHRINE HCL-NACL 10-0.9 MG/250ML-% IV SOLN
INTRAVENOUS | Status: DC | PRN
  Administered 2019-03-14: 30 ug/min via INTRAVENOUS

## 2019-03-14 MED ORDER — CEFAZOLIN SODIUM-DEXTROSE 2-4 GM/100ML-% IV SOLN
INTRAVENOUS | Status: AC
  Filled 2019-03-14: qty 100

## 2019-03-14 MED ORDER — CEFAZOLIN SODIUM-DEXTROSE 2-4 GM/100ML-% IV SOLN
2.0000 g | INTRAVENOUS | Status: AC
  Administered 2019-03-14: 2 g via INTRAVENOUS

## 2019-03-14 MED ORDER — FENTANYL CITRATE (PF) 100 MCG/2ML IJ SOLN
50.0000 ug | Freq: Once | INTRAMUSCULAR | Status: AC
  Administered 2019-03-14: 11:00:00 50 ug via INTRAVENOUS

## 2019-03-14 MED ORDER — FENTANYL CITRATE (PF) 100 MCG/2ML IJ SOLN
INTRAMUSCULAR | Status: AC
  Administered 2019-03-14: 11:00:00 50 ug via INTRAVENOUS
  Filled 2019-03-14: qty 2

## 2019-03-14 MED ORDER — CELECOXIB 200 MG PO CAPS
200.0000 mg | ORAL_CAPSULE | ORAL | Status: AC
  Administered 2019-03-14: 11:00:00 200 mg via ORAL

## 2019-03-14 MED ORDER — DEXAMETHASONE SODIUM PHOSPHATE 10 MG/ML IJ SOLN
INTRAMUSCULAR | Status: DC | PRN
  Administered 2019-03-14: 10 mg via INTRAVENOUS

## 2019-03-14 MED ORDER — ROPIVACAINE HCL 5 MG/ML IJ SOLN
INTRAMUSCULAR | Status: DC | PRN
  Administered 2019-03-14: 20 mg via EPIDURAL

## 2019-03-14 MED ORDER — SODIUM CHLORIDE (PF) 0.9 % IJ SOLN
INTRAMUSCULAR | Status: AC
  Filled 2019-03-14: qty 50

## 2019-03-14 MED ORDER — MORPHINE SULFATE (PF) 2 MG/ML IV SOLN: 1.0000 mg | INTRAVENOUS | Status: DC | PRN

## 2019-03-14 SURGICAL SUPPLY — 46 items
APPLIER CLIP 9.375 MED OPEN (MISCELLANEOUS) ×4
BINDER BREAST LRG (GAUZE/BANDAGES/DRESSINGS) IMPLANT
BINDER BREAST XLRG (GAUZE/BANDAGES/DRESSINGS) ×2 IMPLANT
BIOPATCH BLUE 3/4IN DISK W/1.5 (GAUZE/BANDAGES/DRESSINGS) ×2 IMPLANT
CANISTER SUCT 3000ML PPV (MISCELLANEOUS) ×4 IMPLANT
CHLORAPREP W/TINT 26 (MISCELLANEOUS) ×4 IMPLANT
CLIP APPLIE 9.375 MED OPEN (MISCELLANEOUS) ×2 IMPLANT
COVER SURGICAL LIGHT HANDLE (MISCELLANEOUS) ×4 IMPLANT
COVER TRANSDUCER ULTRASND GEL (DRAPE) ×2 IMPLANT
COVER WAND RF STERILE (DRAPES) ×4 IMPLANT
DERMABOND ADVANCED (GAUZE/BANDAGES/DRESSINGS) ×2
DERMABOND ADVANCED .7 DNX12 (GAUZE/BANDAGES/DRESSINGS) ×2 IMPLANT
DEVICE DISSECT PLASMABLAD 3.0S (MISCELLANEOUS) IMPLANT
DEVICE DUBIN SPECIMEN MAMMOGRA (MISCELLANEOUS) ×2 IMPLANT
DRAIN CHANNEL 19F RND (DRAIN) ×8 IMPLANT
DRAPE CHEST BREAST 15X10 FENES (DRAPES) ×4 IMPLANT
DRSG PAD ABDOMINAL 8X10 ST (GAUZE/BANDAGES/DRESSINGS) IMPLANT
DRSG TEGADERM 4X4.75 (GAUZE/BANDAGES/DRESSINGS) ×2 IMPLANT
ELECT CAUTERY BLADE 6.4 (BLADE) ×4 IMPLANT
ELECT REM PT RETURN 9FT ADLT (ELECTROSURGICAL) ×4
ELECTRODE REM PT RTRN 9FT ADLT (ELECTROSURGICAL) ×2 IMPLANT
EVACUATOR SILICONE 100CC (DRAIN) ×8 IMPLANT
FILTER IN LINE W/DETACHED HOSE (FILTER) ×4 IMPLANT
GAUZE SPONGE 4X4 12PLY STRL (GAUZE/BANDAGES/DRESSINGS) IMPLANT
GLOVE BIO SURGEON STRL SZ7.5 (GLOVE) ×4 IMPLANT
GOWN STRL REUS W/ TWL LRG LVL3 (GOWN DISPOSABLE) ×6 IMPLANT
GOWN STRL REUS W/TWL LRG LVL3 (GOWN DISPOSABLE) ×6
KIT BASIN OR (CUSTOM PROCEDURE TRAY) ×4 IMPLANT
KIT TURNOVER KIT B (KITS) ×4 IMPLANT
NDL 18GX1X1/2 (RX/OR ONLY) (NEEDLE) IMPLANT
NEEDLE 18GX1X1/2 (RX/OR ONLY) (NEEDLE) ×4 IMPLANT
NS IRRIG 1000ML POUR BTL (IV SOLUTION) ×4 IMPLANT
PACK GENERAL/GYN (CUSTOM PROCEDURE TRAY) ×4 IMPLANT
PAD ABD 8X10 STRL (GAUZE/BANDAGES/DRESSINGS) ×2 IMPLANT
PAD ARMBOARD 7.5X6 YLW CONV (MISCELLANEOUS) ×4 IMPLANT
PENCIL SMOKE EVACUATOR (MISCELLANEOUS) ×4 IMPLANT
PLASMABLADE 3.0S (MISCELLANEOUS)
SPECIMEN JAR X LARGE (MISCELLANEOUS) ×4 IMPLANT
SUT ETHILON 3 0 FSL (SUTURE) ×8 IMPLANT
SUT MON AB 4-0 PC3 18 (SUTURE) ×6 IMPLANT
SUT VIC AB 3-0 SH 18 (SUTURE) ×6 IMPLANT
SYR CONTROL 10ML LL (SYRINGE) ×6 IMPLANT
TOWEL GREEN STERILE (TOWEL DISPOSABLE) ×4 IMPLANT
TOWEL GREEN STERILE FF (TOWEL DISPOSABLE) ×4 IMPLANT
TUBE CONNECTING 12'X1/4 (SUCTIONS)
TUBE CONNECTING 12X1/4 (SUCTIONS) IMPLANT

## 2019-03-14 NOTE — Anesthesia Procedure Notes (Addendum)
Anesthesia Regional Block: Pectoralis block   Pre-Anesthetic Checklist: ,, timeout performed, Correct Patient, Correct Site, Correct Laterality, Correct Procedure, Correct Position, site marked, Risks and benefits discussed,  Surgical consent,  Pre-op evaluation,  At surgeon's request and post-op pain management  Laterality: Left  Prep: Maximum Sterile Barrier Precautions used, chloraprep       Needles:  Injection technique: Single-shot  Needle Type: Echogenic Stimulator Needle     Needle Length: 9cm  Needle Gauge: 22     Additional Needles:   Procedures:,,,, ultrasound used (permanent image in chart),,,,  Narrative:  Start time: 03/14/2019 11:05 AM End time: 03/14/2019 11:10 AM Injection made incrementally with aspirations every 5 mL.  Performed by: Personally  Anesthesiologist: Pervis Hocking, DO  Additional Notes: Monitors applied. No increased pain on injection. No increased resistance to injection. Injection made in 5cc increments. Good needle visualization. Patient tolerated procedure well.

## 2019-03-14 NOTE — Interval H&P Note (Signed)
History and Physical Interval Note:  03/14/2019 11:21 AM  Jasmin Lewis  has presented today for surgery, with the diagnosis of LEFT BREAST CANCER.  The various methods of treatment have been discussed with the patient and family. After consideration of risks, benefits and other options for treatment, the patient has consented to  Procedure(s): LEFT MASTECTOMY WITH TARGETED LYMPH NODE DISSECTION AND SENTINEL LYMPH NODE BIOPSY (Left) as a surgical intervention.  The patient's history has been reviewed, patient examined, no change in status, stable for surgery.  I have reviewed the patient's chart and labs.  Questions were answered to the patient's satisfaction.     Autumn Messing III

## 2019-03-14 NOTE — H&P (Signed)
Jasmin Lewis  Location: [facility] Patient #: [id number] DOB: 06/21/1931 Married / Language: English / Race: White Female   History of Present Illness  The patient is a 85 year old female who presents for a follow-up for Breast cancer. The patient is an 85 year old white female who initially presented with a locally advanced left breast cancer measuring 7 cm with 2 abnormal lymph nodes one of which was positive. She has been receiving neoadjuvant chemotherapy and has tolerated it well. She has had a complete radiographic response. She is now ready to schedule definitive surgery. She did have 3 areas biopsied in the breast initially and 1 lymph node biopsy.   Allergies  No Known Drug Allergies   Medication History  Norvasc (5MG  Tablet, Oral) Active. Citracal + D (315-200MG -UNIT Tablet, Oral) Active. Fish Oil (1000MG  Capsule DR, Oral) Active. Medications Reconciled    Review of Systems  General Not Present- Appetite Loss, Chills, Fatigue, Fever, Night Sweats, Weight Gain and Weight Loss. Skin Not Present- Change in Wart/Mole, Dryness, Hives, Jaundice, New Lesions, Non-Healing Wounds, Rash and Ulcer. HEENT Not Present- Earache, Hearing Loss, Hoarseness, Nose Bleed, Oral Ulcers, Ringing in the Ears, Seasonal Allergies, Sinus Pain, Sore Throat, Visual Disturbances, Wears glasses/contact lenses and Yellow Eyes. Respiratory Not Present- Bloody sputum, Chronic Cough, Difficulty Breathing, Snoring and Wheezing. Breast Present- Breast Mass. Not Present- Breast Pain, Nipple Discharge and Skin Changes. Cardiovascular Not Present- Chest Pain, Difficulty Breathing Lying Down, Leg Cramps, Palpitations, Rapid Heart Rate, Shortness of Breath and Swelling of Extremities. Gastrointestinal Present- Hemorrhoids. Not Present- Abdominal Pain, Bloating, Bloody Stool, Change in Bowel Habits, Chronic diarrhea, Constipation, Difficulty Swallowing, Excessive gas, Gets full quickly at  meals, Indigestion, Nausea, Rectal Pain and Vomiting. Female Genitourinary Not Present- Frequency, Nocturia, Painful Urination, Pelvic Pain and Urgency. Musculoskeletal Not Present- Back Pain, Joint Pain, Joint Stiffness, Muscle Pain, Muscle Weakness and Swelling of Extremities. Neurological Not Present- Decreased Memory, Fainting, Headaches, Numbness, Seizures, Tingling, Tremor, Trouble walking and Weakness. Psychiatric Not Present- Anxiety, Bipolar, Change in Sleep Pattern, Depression, Fearful and Frequent crying. Endocrine Not Present- Cold Intolerance, Excessive Hunger, Hair Changes, Heat Intolerance, Hot flashes and New Diabetes. Hematology Not Present- Blood Thinners, Easy Bruising, Excessive bleeding, Gland problems, HIV and Persistent Infections.  Vitals  Weight: 132.6 lb Height: 63in Body Surface Area: 1.62 m Body Mass Index: 23.49 kg/m  Temp.: 98.20F  Pulse: 86 (Regular)  BP: 132/74 (Sitting, Left Arm, Standard)       Physical Exam  General Mental Status-Alert. General Appearance-Consistent with stated age. Hydration-Well hydrated. Voice-Normal.  Head and Neck Head-normocephalic, atraumatic with no lesions or palpable masses. Trachea-midline. Thyroid Gland Characteristics - normal size and consistency.  Eye Eyeball - Bilateral-Extraocular movements intact. Sclera/Conjunctiva - Bilateral-No scleral icterus.  Chest and Lung Exam Chest and lung exam reveals -quiet, even and easy respiratory effort with no use of accessory muscles and on auscultation, normal breath sounds, no adventitious sounds and normal vocal resonance. Inspection Chest Wall - Normal. Back - normal.  Breast Note: There is no palpable mass in either breast. There is no palpable axillary, supraclavicular, or cervical lymphadenopathy.   Cardiovascular Cardiovascular examination reveals -normal heart sounds, regular rate and rhythm with no murmurs and normal pedal  pulses bilaterally.  Abdomen Inspection Inspection of the abdomen reveals - No Hernias. Skin - Scar - no surgical scars. Palpation/Percussion Palpation and Percussion of the abdomen reveal - Soft, Non Tender, No Rebound tenderness, No Rigidity (guarding) and No hepatosplenomegaly. Auscultation Auscultation of the abdomen reveals -  Bowel sounds normal.  Neurologic Neurologic evaluation reveals -alert and oriented x 3 with no impairment of recent or remote memory. Mental Status-Normal.  Musculoskeletal Normal Exam - Left-Upper Extremity Strength Normal and Lower Extremity Strength Normal. Normal Exam - Right-Upper Extremity Strength Normal and Lower Extremity Strength Normal.  Lymphatic Head & Neck  General Head & Neck Lymphatics: Bilateral - Description - Normal. Axillary  General Axillary Region: Bilateral - Description - Normal. Tenderness - Non Tender. Femoral & Inguinal  Generalized Femoral & Inguinal Lymphatics: Bilateral - Description - Normal. Tenderness - Non Tender.    Assessment & Plan  MALIGNANT NEOPLASM OF UPPER-OUTER QUADRANT OF LEFT BREAST IN FEMALE, ESTROGEN RECEPTOR NEGATIVE (C50.412) Impression: The patient had a locally advanced left breast cancer that has responded well to neoadjuvant chemotherapy. She has had a complete radiographic response. At this point given that she has had so many places biopsied in the left breast I think she would benefit from mastectomy. She is also a good candidate for targeted node dissection and sentinel node biopsy since the 2 abnormal lymph nodes looked normal now. I have discussed with her in detail the risks and benefits of the operation as well as some of the technical aspects and she understands and wishes to proceed.

## 2019-03-14 NOTE — Op Note (Signed)
03/14/2019  1:08 PM  PATIENT:  Jasmin Lewis  85 y.o. female  PRE-OPERATIVE DIAGNOSIS:  LEFT BREAST CANCER  POST-OPERATIVE DIAGNOSIS:  LEFT BREAST CANCER  PROCEDURE:  Procedure(s): LEFT MASTECTOMY WITH TARGETED LYMPH NODE DISSECTION AND LEFT DEEP AXILLARY SENTINEL NODE MAPPING WITH INJECTION BLUE DYE  SURGEON:  Surgeon(s) and Role:    * Jovita Kussmaul, MD - Primary  PHYSICIAN ASSISTANT:   ASSISTANTS: none   ANESTHESIA:   general  EBL:  minimal   BLOOD ADMINISTERED:none  DRAINS: (1) Jackson-Pratt drain(s) with closed bulb suction in the prepectoral space   LOCAL MEDICATIONS USED:  NONE  SPECIMEN:  Source of Specimen:  left mastectomy and targeted node and sentinel node x 2  DISPOSITION OF SPECIMEN:  PATHOLOGY  COUNTS:  YES  TOURNIQUET:  * No tourniquets in log *  DICTATION: .Dragon Dictation   After informed consent was obtained the patient was brought to the operating room and placed in the supine position on the operating table.  After adequate induction of general anesthesia the patient's left chest, breast, and axillary area were prepped with ChloraPrep, allowed to dry, and draped in usual sterile manner.  An appropriate timeout was performed.  Previously an I-125 seed was placed in the left axilla to mark a previously biopsied positive lymph node.  Also earlier in the day the patient underwent injection 1 mCi of technetium sulfur colloid in the subareolar position on the left.  At this point, 2 cc of methylene blue and 3 cc of injectable saline were injected in the subareolar position of the left breast.  After several minutes an elliptical incision was then made around the nipple and areola complex in order to minimize the excess skin.  The incision was carried through the skin and subcutaneous tissue sharply with the plasma blade.  Breast hooks were then used to elevate the skin flaps anteriorly towards the ceiling.  Thin skin flaps were then created circumferentially  by dissecting between the breast tissue and the subcutaneous fat.  This dissection was carried all the way to the chest wall.  Next the breast was removed from the pectoralis muscle with the pectoralis fascia.  Once the dissection reached the left axilla then the neoprobe was used to identify the seed marking the positive lymph node.  The lymph node was excised sharply with the plasma blade and the lymphatics and small vessels around it were controlled with clips.  A specimen radiograph was obtained that showed the seed within the specimen.  The seed and node were then sent to pathology for further evaluation.  2 other palpable lymph nodes were identified in the left axilla and these were excised sharply with the plasma blade and the lymphatics and small vessels were controlled with clips.  No other hot, palpable, or blue lymph nodes were identified in the left axilla.  The wound was then irrigated with copious amounts of saline.  Hemostasis was achieved using the PlasmaBlade.  A small stab incision was then made near the anterior axillary line inferior to the operative bed.  A tonsil clamp was placed through this opening and used to bring a 19 [place] round Blake drain into the operative bed.  The drain was curled along the chest wall and near the axilla.  The drain was anchored to the skin with a 3-0 nylon stitch.  Next the superior and inferior skin flaps were grossly reapproximated with interrupted 3-0 Vicryl stitches.  The skin was then closed with a running 4-0  Monocryl subcuticular stitch.  Dermabond dressings and sterile dressings were applied.  Patient tolerated the procedure well.  The drain was placed to bulb suction and there was a good seal.  At the end of the case all needle sponge and instrument counts were correct.  The patient was then awakened and taken to recovery in stable condition.  PLAN OF CARE: Admit for overnight observation  PATIENT DISPOSITION:  PACU - hemodynamically stable.   Delay  start of Pharmacological VTE agent (>24hrs) due to surgical blood loss or risk of bleeding: no

## 2019-03-14 NOTE — Anesthesia Preprocedure Evaluation (Addendum)
Anesthesia Evaluation  Patient identified by MRN, date of birth, ID band Patient awake    Reviewed: Allergy & Precautions, NPO status , Patient's Chart, lab work & pertinent test results  Airway Mallampati: II  TM Distance: >3 FB Neck ROM: Full    Dental no notable dental hx. (+) Teeth Intact, Dental Advisory Given   Pulmonary neg pulmonary ROS,    Pulmonary exam normal breath sounds clear to auscultation       Cardiovascular hypertension, Pt. on medications Normal cardiovascular exam Rhythm:Regular Rate:Normal  Echo 11/2018: LVEF >65%, normal wall thickness, normal wall motion, grade 1 DD, indeterminate LV filling pressure, normal RV function, reduced GLS at ~-16%, mild aortic valve sclerosis with trivial AI, normal biatrial size, normal IVC   Neuro/Psych negative neurological ROS  negative psych ROS   GI/Hepatic Neg liver ROS, Hx CRC s/p hemicolectomy 2007, chemo   Endo/Other  negative endocrine ROS  Renal/GU negative Renal ROS  negative genitourinary   Musculoskeletal  (+) Arthritis , Osteoarthritis,    Abdominal Normal abdominal exam  (+)   Peds negative pediatric ROS (+)  Hematology negative hematology ROS (+)   Anesthesia Other Findings Breast ca  Reproductive/Obstetrics negative OB ROS                            Anesthesia Physical Anesthesia Plan  ASA: II  Anesthesia Plan: General and Regional   Post-op Pain Management: GA combined w/ Regional for post-op pain   Induction: Intravenous  PONV Risk Score and Plan: 3 and Ondansetron, Dexamethasone and Treatment may vary due to age or medical condition  Airway Management Planned: LMA  Additional Equipment: None  Intra-op Plan:   Post-operative Plan: Extubation in OR  Informed Consent: I have reviewed the patients History and Physical, chart, labs and discussed the procedure including the risks, benefits and alternatives  for the proposed anesthesia with the patient or authorized representative who has indicated his/her understanding and acceptance.     Dental advisory given  Plan Discussed with: CRNA  Anesthesia Plan Comments:         Anesthesia Quick Evaluation

## 2019-03-14 NOTE — Anesthesia Postprocedure Evaluation (Signed)
Anesthesia Post Note  Patient: Jasmin Lewis  Procedure(s) Performed: LEFT MASTECTOMY WITH TARGETED LYMPH NODE DISSECTION (Left Breast) Left Axillary Sentinel Lymph  Node Biopsy (Left Axilla)     Patient location during evaluation: PACU Anesthesia Type: Regional and General Level of consciousness: awake and alert, oriented and patient cooperative Pain management: pain level controlled Vital Signs Assessment: post-procedure vital signs reviewed and stable Respiratory status: spontaneous breathing, nonlabored ventilation and respiratory function stable Cardiovascular status: blood pressure returned to baseline and stable Postop Assessment: no apparent nausea or vomiting Anesthetic complications: no    Last Vitals:  Vitals:   03/14/19 1322 03/14/19 1337  BP: 139/61 139/68  Pulse: 66 71  Resp: 17 12  Temp: (!) 36.4 C   SpO2: 94% 94%    Last Pain:  Vitals:   03/14/19 1322  TempSrc:   PainSc: 0-No pain                 Pervis Hocking

## 2019-03-14 NOTE — Anesthesia Procedure Notes (Signed)
Procedure Name: LMA Insertion Date/Time: 03/14/2019 11:50 AM Performed by: Trinna Post., CRNA Pre-anesthesia Checklist: Patient identified, Emergency Drugs available, Suction available and Patient being monitored Patient Re-evaluated:Patient Re-evaluated prior to induction Oxygen Delivery Method: Circle system utilized Preoxygenation: Pre-oxygenation with 100% oxygen Induction Type: IV induction LMA: LMA inserted LMA Size: 4.0 Number of attempts: 1 Placement Confirmation: positive ETCO2 and breath sounds checked- equal and bilateral Tube secured with: Tape Dental Injury: Teeth and Oropharynx as per pre-operative assessment

## 2019-03-14 NOTE — Transfer of Care (Signed)
Immediate Anesthesia Transfer of Care Note  Patient: Jasmin Lewis  Procedure(s) Performed: LEFT MASTECTOMY WITH TARGETED LYMPH NODE DISSECTION (Left Breast) Left Axillary Sentinel Lymph  Node Biopsy (Left Axilla)  Patient Location: PACU  Anesthesia Type:General and Regional  Level of Consciousness: awake and drowsy  Airway & Oxygen Therapy: Patient Spontanous Breathing  Post-op Assessment: Report given to RN and Post -op Vital signs reviewed and stable  Post vital signs: Reviewed and stable  Last Vitals:  Vitals Value Taken Time  BP 139/61 03/14/19 1322  Temp    Pulse 67 03/14/19 1324  Resp 18 03/14/19 1324  SpO2 93 % 03/14/19 1324  Vitals shown include unvalidated device data.  Last Pain:  Vitals:   03/14/19 1110  TempSrc:   PainSc: 0-No pain      Patients Stated Pain Goal: 5 ([id number])  Complications: No apparent anesthesia complications

## 2019-03-15 ENCOUNTER — Encounter (HOSPITAL_COMMUNITY): Payer: Self-pay | Admitting: General Surgery

## 2019-03-15 DIAGNOSIS — C50412 Malignant neoplasm of upper-outer quadrant of left female breast: Secondary | ICD-10-CM | POA: Diagnosis not present

## 2019-03-15 MED ORDER — PANTOPRAZOLE SODIUM 40 MG PO TBEC: 40.0000 mg | DELAYED_RELEASE_TABLET | Freq: Every day | ORAL | Status: DC

## 2019-03-15 MED ORDER — HYDROCODONE-ACETAMINOPHEN 5-325 MG PO TABS: 1.0000 | ORAL_TABLET | Freq: Four times a day (QID) | ORAL | 0 refills | Status: DC | PRN

## 2019-03-15 NOTE — Discharge Summary (Signed)
Physician Discharge Summary  Patient ID: Jasmin Lewis MRN: [id number] DOB/AGE: 07-11-1931 85 y.o.  Admit date: 03/14/2019 Discharge date: 03/15/2019  Admission Diagnoses:  Discharge Diagnoses:  Active Problems:   Cancer of left female breast  [facility])   Discharged Condition: good  Hospital Course: the pt underwent left mastectomy. She tolerated surgery well. On pod 1 she was ready for d/c home  Consults: None  Significant Diagnostic Studies: none  Treatments: surgery: as above  Discharge Exam: Blood pressure (!) 100/53, pulse 69, temperature 98 F (36.7 C), temperature source Oral, resp. rate 16, height 5\' 3"  (1.6 m), weight 59 kg, SpO2 96 %. General appearance: alert and cooperative Resp: clear to auscultation bilaterally Chest wall: skin flaps look good Cardio: regular rate and rhythm GI: soft, non-tender; bowel sounds normal; no masses,  no organomegaly  Disposition:    Allergies as of 03/15/2019   No Known Allergies     Medication List    TAKE these medications   amLODipine 5 MG tablet Commonly known as: NORVASC Take 5 mg by mouth daily.   calcium citrate-vitamin D 315-200 MG-UNIT tablet Commonly known as: CITRACAL+D Take 1 tablet by mouth daily.   fish oil-omega-3 fatty acids 1000 MG capsule Take 1 g by mouth daily.   HYDROcodone-acetaminophen 5-325 MG tablet Commonly known as: NORCO/VICODIN Take 1-2 tablets by mouth every 6 (six) hours as needed for moderate pain.   ondansetron 8 MG tablet Commonly known as: ZOFRAN Take 1 tablet (8 mg total) by mouth every 8 (eight) hours as needed for nausea or vomiting.      Follow-up Information    Autumn Messing III, MD Follow up in 2 week(s).   Specialty: General Surgery Contact information: [address] [place] [id number] [phone]           Signed: Autumn Messing III 03/15/2019, 8:22 AM

## 2019-03-15 NOTE — Progress Notes (Signed)
Patient discharged home with instructions, return demonstrated on how to empty JP drain, answered questions.

## 2019-03-15 NOTE — Progress Notes (Signed)
1 Day Post-Op   Subjective/Chief Complaint: No complaints   Objective: Vital signs in last 24 hours: Temp:  [97 F (36.1 C)-98.5 F (36.9 C)] 98 F (36.7 C) (11/05 0418) Pulse Rate:  [63-86] 69 (11/05 0418) Resp:  [9-20] 16 (11/05 0418) BP: (100-157)/(48-68) 100/53 (11/05 0418) SpO2:  [93 %-98 %] 96 % (11/05 0418) FiO2 (%):  [21 %] 21 % (11/04 1653) Weight:  [59 kg] 59 kg (11/04 1016) Last BM Date: 03/13/19  Intake/Output from previous day: 11/04 0701 - 11/05 0700 In: 520.4 [I.V.:520.4] Out: 70 [Drains:20; Blood:50] Intake/Output this shift: No intake/output data recorded.  General appearance: alert and cooperative Resp: clear to auscultation bilaterally Chest wall: skin flaps look good Cardio: regular rate and rhythm GI: soft, non-tender; bowel sounds normal; no masses,  no organomegaly  Lab Results:  No results for input(s): WBC, HGB, HCT, PLT in the last 72 hours. BMET No results for input(s): NA, K, CL, CO2, GLUCOSE, BUN, CREATININE, CALCIUM in the last 72 hours. PT/INR No results for input(s): LABPROT, INR in the last 72 hours. ABG No results for input(s): PHART, HCO3 in the last 72 hours.  Invalid input(s): PCO2, PO2  Studies/Results: Nm Sentinel Node Inj-no Rpt (breast)  Result Date: 03/14/2019 Sulfur colloid was injected by the nuclear medicine technologist for melanoma sentinel node.   [place] Radioactive Seed Loc  Result Date: 03/14/2019 CLINICAL DATA:  85 year old female for radioactive seed localization of LEFT axillary lymph node metastasis. EXAM: ULTRASOUND GUIDED RADIOACTIVE SEED LOCALIZATION OF THE LEFT AXILLA COMPARISON:  Previous exam(s). FINDINGS: Patient presents for radioactive seed localization prior to targeted LEFT axillary lymph node excision and LEFT mastectomy. I met with the patient and we discussed the procedure of seed localization including benefits and alternatives. We discussed the high likelihood of a successful procedure. We  discussed the risks of the procedure including infection, bleeding, tissue injury and further surgery. We discussed the low dose of radioactivity involved in the procedure. Informed, written consent was given. The usual time-out protocol was performed immediately prior to the procedure. Using ultrasound guidance, sterile technique, 1% lidocaine and an I-125 radioactive seed, the HydroMARK clip in the LEFT axilla was localized using a MEDIAL approach, with satisfactory placement confirmed sonographically for Dr. [name]. Mammograms were not performed as this HydroMARK clip was unable to be visualized mammographically post biopsy. Follow-up survey of the patient confirms presence of the radioactive seed. Order number of I-125 seed:  [id number]. Total activity:  1.572 millicuries.  Reference Date: 02/14/2019 The patient tolerated the procedure well and was released from the [facility]. She was given instructions regarding seed removal. IMPRESSION: Radioactive seed localization LEFT axilla with placement confirmed sonographically. No apparent complications. Electronically Signed   By: Margarette Canada M.D.   On: 03/14/2019 09:49    Anti-infectives: Anti-infectives (From admission, onward)   Start     Dose/Rate Route Frequency Ordered Stop   03/14/19 1003  ceFAZolin (ANCEF) 2-4 GM/100ML-% IVPB    Note to Pharmacy: Tressia Miners Ch: cabinet override      03/14/19 1003 03/14/19 1151   03/14/19 1000  ceFAZolin (ANCEF) IVPB 2g/100 mL premix     2 g 200 mL/hr over 30 Minutes Intravenous On call to O.R. 03/14/19 0956 03/14/19 1206      Assessment/Plan: s/p Procedure(s): LEFT MASTECTOMY WITH TARGETED LYMPH NODE DISSECTION (Left) Left Axillary Sentinel Lymph  Node Biopsy (Left) Advance diet  Teach pt drain care D/c today  LOS: 0 days    Autumn Messing  III 03/15/2019

## 2019-03-19 LAB — SURGICAL PATHOLOGY

## 2019-03-26 ENCOUNTER — Telehealth: Payer: Self-pay | Admitting: Hematology

## 2019-03-26 NOTE — Telephone Encounter (Signed)
Confirmed 12/4 lab/fu with patient.

## 2019-03-27 ENCOUNTER — Telehealth: Payer: Self-pay | Admitting: Hematology

## 2019-03-27 NOTE — Telephone Encounter (Signed)
Per YF added phone visit for 11/18 @ 1:20 pm. Confirmed with patient.

## 2019-03-28 ENCOUNTER — Inpatient Hospital Stay: Payer: Medicare Other | Attending: Hematology | Admitting: Hematology

## 2019-03-28 ENCOUNTER — Encounter: Payer: Self-pay | Admitting: Hematology

## 2019-03-28 DIAGNOSIS — Z85038 Personal history of other malignant neoplasm of large intestine: Secondary | ICD-10-CM | POA: Insufficient documentation

## 2019-03-28 DIAGNOSIS — C773 Secondary and unspecified malignant neoplasm of axilla and upper limb lymph nodes: Secondary | ICD-10-CM | POA: Insufficient documentation

## 2019-03-28 DIAGNOSIS — Z79899 Other long term (current) drug therapy: Secondary | ICD-10-CM | POA: Insufficient documentation

## 2019-03-28 DIAGNOSIS — Z171 Estrogen receptor negative status [ER-]: Secondary | ICD-10-CM | POA: Insufficient documentation

## 2019-03-28 DIAGNOSIS — I10 Essential (primary) hypertension: Secondary | ICD-10-CM | POA: Diagnosis not present

## 2019-03-28 DIAGNOSIS — C50412 Malignant neoplasm of upper-outer quadrant of left female breast: Secondary | ICD-10-CM | POA: Insufficient documentation

## 2019-03-28 DIAGNOSIS — Z5112 Encounter for antineoplastic immunotherapy: Secondary | ICD-10-CM | POA: Insufficient documentation

## 2019-03-28 NOTE — Progress Notes (Signed)
[facility]   [phone] [phone]   Clinic Follow up Note   Patient Care Team: [name], MD as PCP - General (Family Medicine)   I connected with Jasmin Lewis on 03/28/2019 at  1:20 PM EST by telephone visit and verified that I am speaking with the correct person using two identifiers.  I discussed the limitations, risks, security and privacy concerns of performing an evaluation and management service by telephone and the availability of in person appointments. I also discussed with the patient that there may be a patient responsible charge related to this service. The patient expressed understanding and agreed to proceed.   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 Findings were a 5.9 x 3.8 x 1.2 cm moderate to well-differentiated adenocarcinoma penetrating the muscular wall. Forty lymph nodes negative. No vascular or lymphatic invasion. Multiple diverticula with microabscess formation and inflammation. Carcinoma present in at least 1 diverticulum. Preop CEA 5.2 with lab normal range 0 to 2.5. Preop CT scan with no obvious additional pathology.    2007 -  Chemotherapy   She received oral Xeloda chemotherapy as an adjuvant. She declined treatment on a clinical trial.    11/12/2011 Initial Diagnosis   H/O  colon cancer, stage II   12/09/2011 Procedure   Followup colonoscopy done on 12/09/2011. She was found to have 2 polyps which were removed. Chronic diverticulosis.     Malignant neoplasm of upper-outer quadrant of left breast in female, estrogen receptor negative ([place])  09/26/2018 Mammogram   Mammogram/US of left breast 09/26/18 IMPRESSION:  1. 2.9cm irregular mass in the upper outer left breast corresponds to the palpable abnormality. This is highly suspicious for breast carcinoma.  2. Two adjacent abnormal left axillary  Lymph nodes suspicious for metastatic adenopathy. There is a third borderline abnormal left axillary LN with a cortex thickened to 83m.  3. Benign right breast cyst. No evidence of right breast malignancy.    10/11/2018 Cancer Staging   Staging form: Breast, AJCC 8th Edition - Clinical stage from 10/11/2018: Stage IIB (cT2, cN1, cM0, G3, ER-, PR-, HER2+) - Signed by [name] MD on 11/02/2018   10/11/2018 Initial Biopsy   Diagnosis 10/11/18 1. Breast, left, needle core biopsy, upper outer left 2 o'clock - INVASIVE DUCTAL CARCINOMA. - DUCTAL CARCINOMA IN SITU. - LYMPHOVASCULAR INVASION IS IDENTIFIED. - SEE COMMENT. 2. Lymph node, needle/core biopsy, left axilla - INVASIVE DUCTAL CARCINOMA. - SEE COMMENT.   10/11/2018 Receptors her2   The tumor cells are POSITIVE for Her2 (3+). Estrogen Receptor: 0%, NEGATIVE Progesterone Receptor: 0%, NEGATIVE Proliferation Marker Ki67: 70%   11/02/2018 Initial Diagnosis   Malignant neoplasm of upper-outer quadrant of left breast in female, estrogen receptor negative [place]   11/15/2018 Breast MRI   MRI breast 11/15/18  IMPRESSION: 1. 3.3 centimeter mass in the LATERAL portion  of the LEFT breast consistent with known malignancy. 2. There is significant non mass enhancement surrounding this mass and extending anteriorly into the nipple base, with largest diameter in the anterior to posterior axis, measuring 7.2 centimeters. 3. If the  patient would consider breast conservation, additional MR guided core biopsies are recommended. Consider biopsy of the inferior and anterior extent of the non mass enhancement to document extent of disease. 4. Three enlarged LEFT axillary lymph nodes. 5. RIGHT breast is negative.   11/15/2018 PET scan   PET 11/15/18 IMPRESSION: Hypermetabolic left breast lesion compatible with known primary. Hypermetabolic left axillary lymph nodes are consistent with metastatic disease.   No evidence for additional hypermetabolic metastatic involvement in the neck, chest, abdomen, or pelvis.   11/17/2018 - 03/01/2019 Chemotherapy   Neo-adjuvant Kadcyla and perjeta q3weeks for 6 cycles starting 11/17/18. Stopped before surgery    11/29/2018 Pathology Results   Diagnosis 1. Breast, left, needle core biopsy, inferior anterior (cylinder clip) - INVASIVE DUCTAL CARCINOMA. - DUCTAL CARCINOMA IN SITU. - LYMPHOVASCULAR INVASION IS IDENTIFIED. - SEE COMMENT. 2. Breast, left, needle core biopsy, central posterior (barbell clip) - INVASIVE DUCTAL CARCINOMA. - LYMPHOVASCULAR INVASION IS IDENTIFIED.   02/12/2019 Breast MRI   IMPRESSION: 1. Complete resolution of previously identified enhancing mass and associated non mass enhancement within the left breast. This is consistent with excellent response to chemotherapy. No residual or suspicious findings are identified. 2. No MRI evidence of malignancy on the right. 3. Previously identified left axillary lymphadenopathy not definitively seen on today's study. However, this may be due to decreased field-of-view compared to prior study.     03/14/2019 Cancer Staging   Staging form: Breast, AJCC 8th Edition - Pathologic stage from 03/14/2019: pT0, pN0, cM0, GX, ER: Unknown, PR: Unknown, HER2: Not Assessed - Signed by Truitt Merle, MD on 03/28/2019   03/14/2019 Surgery   LEFT MASTECTOMY WITH TARGETED LYMPH NODE DISSECTION and Left Axillary Sentinel Lymph  Node  Biopsy by Dr [name] 03/14/19    03/14/2019 Pathology Results   FINAL MICROSCOPIC DIAGNOSIS:   A. LYMPH NODE, LEFT, SENTINEL, BIOPSY:  - There is no evidence of carcinoma in 1 of 1 lymph node (0/1).   B. BREAST, LEFT, MASTECTOMY:  - Benign breast parenchyma with treatment-related changes.  - There is no evidence of malignancy.  - See oncology table below.   C. LYMPH NODE, LEFT #1, SENTINEL, BIOPSY:  - There is no evidence of carcinoma in 1 of 1 lymph node (0/1).   D. LYMPH NODE, LEFT #2, SENTINEL, BIOPSY:  - There is no evidence of carcinoma in 1 of 1 lymph node (0/1).     04/04/2019 -  Chemotherapy   Maintenance Herceptin injections every 3 weeks starting 04/04/19 to complete 1 year of treatment that was started in 11/2018.       CURRENT THERAPY:  Maintenance Herceptin injections every 3 weeks starting 04/04/19 to complete 1 year of treatment that was started in 11/2018.   INTERVAL HISTORY:  DOHA BOLING is here for a follow up of post surgery. She notes her surgery went well and so is her recovery. She is managing drain well and has no pain in left shoulder. She plans to f/u with her surgeon tomorrow. She opted to proceed with Herceptin injections for the rest of her treatment.    REVIEW OF SYSTEMS:   Constitutional: Denies fevers, chills or abnormal weight loss Eyes: Denies blurriness of vision Ears, nose, mouth, throat, and face: Denies mucositis or sore  throat Respiratory: Denies cough, dyspnea or wheezes Cardiovascular: Denies palpitation, chest discomfort or lower extremity swelling Gastrointestinal:  Denies nausea, heartburn or change in bowel habits Skin: Denies abnormal skin rashes Lymphatics: Denies new lymphadenopathy or easy bruising Neurological:Denies numbness, tingling or new weaknesses Behavioral/Psych: Mood is stable, no new changes  All other systems were reviewed with the patient and are negative.  MEDICAL HISTORY:  Past Medical History:  Diagnosis  Date   Arthritis    shoulder   Cancer ([place])    left breast ca   [name], stage II 11/12/2011   Sigmoid lesion 5.9 cm  40 nodes negative but focus of cancer in a diverticum   Pre-op CEA 5.2 with lab normal up to 2.5 resected 09/21/05   Xeloda adjuvant chemotherapy   Hypertension     SURGICAL HISTORY: Past Surgical History:  Procedure Laterality Date   COLONOSCOPY     HEMICOLECTOMY  2007   MASTECTOMY WITH AXILLARY LYMPH NODE DISSECTION Left 03/14/2019   Procedure: LEFT MASTECTOMY WITH TARGETED LYMPH NODE DISSECTION;  Surgeon: Jovita Kussmaul, MD;  Location: [facility];  Service: General;  Laterality: Left;   SENTINEL NODE BIOPSY Left 03/14/2019   Procedure: Left Axillary Sentinel Lymph  Node Biopsy;  Surgeon: Jovita Kussmaul, MD;  Location: [facility];  Service: General;  Laterality: Left;   TONSILLECTOMY     WISDOM TOOTH EXTRACTION      I have reviewed the social history and family history with the patient and they are unchanged from previous note.  ALLERGIES:  has No Known Allergies.  MEDICATIONS:  Current Outpatient Medications  Medication Sig Dispense Refill   amLODipine (NORVASC) 5 MG tablet Take 5 mg by mouth daily.      calcium citrate-vitamin D (CITRACAL+D) 315-200 MG-UNIT per tablet Take 1 tablet by mouth daily.     fish oil-omega-3 fatty acids 1000 MG capsule Take 1 g by mouth daily.     HYDROcodone-acetaminophen (NORCO/VICODIN) 5-325 MG tablet Take 1-2 tablets by mouth every 6 (six) hours as needed for moderate pain. 10 tablet 0   ondansetron (ZOFRAN) 8 MG tablet Take 1 tablet (8 mg total) by mouth every 8 (eight) hours as needed for nausea or vomiting. (Patient not taking: Reported on 03/07/2019) 20 tablet 2   No current facility-administered medications for this visit.     PHYSICAL EXAMINATION: ECOG PERFORMANCE STATUS: 1 - Symptomatic but completely ambulatory  No vitals taken today, Exam not performed today   LABORATORY DATA:  I have reviewed the data  as listed CBC Latest Ref Rng & Units 03/01/2019 02/09/2019 01/19/2019  WBC 4.0 - 10.5 K/uL 5.6 6.5 5.6  Hemoglobin 12.0 - 15.0 g/dL 13.8 13.6 12.9  Hematocrit 36.0 - 46.0 % 42.8 40.4 37.7  Platelets 150 - 400 K/uL 144(L) 182 185     CMP Latest Ref Rng & Units 03/01/2019 02/09/2019 01/19/2019  Glucose 70 - 99 mg/dL 107(H) 102(H) 111(H)  BUN 8 - 23 mg/dL _0 Creatinine 0.44 - 1.00 mg/dL 0.79 0.76 0.84  Sodium 135 - 145 mmol/L 139 141 138  Potassium 3.5 - 5.1 mmol/L 3.9 4.0 4.1  Chloride 98 - 111 mmol/L 106 107 105  CO2 22 - 32 mmol/L _1 Calcium 8.9 - 10.3 mg/dL 9.8 9.8 9.5  Total Protein 6.5 - 8.1 g/dL 7.7 7.8 7.5  Total Bilirubin 0.3 - 1.2 mg/dL 0.4 0.4 0.4  Alkaline Phos 38 - 126 U/L 90 98 92  AST 15 -  41 U/L 52(H) 54(H) 52(H)  ALT 0 - 44 U/L 38 43 40      RADIOGRAPHIC STUDIES: I have personally reviewed the radiological images as listed and agreed with the findings in the report. No results found.   ASSESSMENT & PLAN:  ANOUSHKA DIVITO is a 85 y.o. female with   1.Malignant neoplasm of upper-outer quadrant of left breast,invasive ductal carcinoma, stageIIB, c(T2,N1,M0), ER-/PR-, HER2+, Grade3, ypT0N0 -She was diagnosed in 10/2018.Shepresented with invasive ductal carcinomawith left axillary node metastasis. 2 abnormal LNs were seen on [place], 1 was biopsied.Her tumor is negative for ER and PR, HER2 positive, which is more aggressive. Staging PET was negative for distant metastasis -She completed 6 cycles of Neoadjuvant Kadcyla and perjeta before proceeding with surgery and tolerated very well.   -She underwent left mastectomy with LN dissection by Dr [name] on 03/14/19. We discussed her pathology which showed complete response to chemo and no residual tumor in breast and nodes  -will discussed rad/onc to see if she needs post mastectomy radiation given her initial node positive disease.  -Given her ER/PR negative disease she is not need anti-estrogen therapy.  -I  discussed even she had a complete response to neoadjuvant chemo, and had a surgery, she still has risk for future breast cancer or recurrence. Standard treatment is 1 year long with maintenance HER-2 antibody therapy. She can continue Kadcyla q3weeks, or switch to Herceptin injections q3weeks check think is reasonable given her complete response, advanced age and convinience. She opted to Herceptin injections. Plan to start next week. -F/u with second injection.   2. H/o stage IIadenocarcinoma of the sigmoid colon diagnosed in May 2007 -S/p surgicalresectionon 09/21/05.S/p adjuvant Xeloda.   3. Genetic Testing  -Given her history of cancer and sister had colon cancer, I recommend genetic testing to rule out inheritable mutations. Genetic referralpreviouslysent, pt wanted to hold off for now     PLAN: -Lab and Herceptin injection in 1 weeks  -Lab, f/u and Herceptin injection in 4 weeks  No problem-specific Assessment & Plan notes found for this encounter.   [name].  I discussed the assessment and treatment plan with the patient. The patient was provided an opportunity to ask questions and all were answered. The patient agreed with the plan and demonstrated an understanding of the instructions.  The patient was advised to call back or seek an in-person evaluation if the symptoms worsen or if the condition fails to improve as anticipated.  I provided 25 minutes of non face-to-face telephone visit time during this encounter, and > 50% was spent counseling as documented under my assessment & plan.    Truitt Merle, MD 03/28/2019   I, Joslyn Devon, am acting as scribe for Truitt Merle, MD.   I have reviewed the above documentation for accuracy and completeness, and I agree with the above.

## 2019-03-28 NOTE — Progress Notes (Signed)
DISCONTINUE OFF PATHWAY REGIMEN - Breast   [id number]:Ado-Trastuzumab Emtansine 3.6 mg/kg IV D1 q21 Days:   A cycle is every 21 days:     Ado-trastuzumab emtansine   **Always confirm dose/schedule in your pharmacy ordering system**  REASON: Other Reason PRIOR TREATMENT: Off Pathway: Ado-Trastuzumab Emtansine 3.6 mg/kg IV D1 q21 Days TREATMENT RESPONSE: Complete Response (CR)  START ON PATHWAY REGIMEN - Breast     A cycle is every 21 days:     Trastuzumab-xxxx   **Always confirm dose/schedule in your pharmacy ordering system**  Patient Characteristics: Post-Neoadjuvant Therapy and Resection, HER2 Positive, ER Negative/Unknown, No Residual Disease, Adjuvant Targeted Therapy After Neoadjuvant Chemo/Targeted Therapy Therapeutic Status: Post-Neoadjuvant Therapy and Resection ER Status: Negative (-) HER2 Status: Positive (+) PR Status: Negative (-) Residual Invasive Disease Post-Neoadjuvant Therapy<= No Intent of Therapy: Curative Intent, Discussed with Patient 

## 2019-03-29 ENCOUNTER — Other Ambulatory Visit: Payer: Medicare Other

## 2019-03-29 ENCOUNTER — Telehealth: Payer: Self-pay

## 2019-03-29 ENCOUNTER — Telehealth: Payer: Self-pay | Admitting: Hematology

## 2019-03-29 ENCOUNTER — Other Ambulatory Visit: Payer: Self-pay | Admitting: Hematology

## 2019-03-29 ENCOUNTER — Ambulatory Visit: Payer: Medicare Other | Admitting: Hematology

## 2019-03-29 ENCOUNTER — Ambulatory Visit: Payer: Medicare Other

## 2019-03-29 DIAGNOSIS — C50412 Malignant neoplasm of upper-outer quadrant of left female breast: Secondary | ICD-10-CM

## 2019-03-29 DIAGNOSIS — T451X5S Adverse effect of antineoplastic and immunosuppressive drugs, sequela: Secondary | ICD-10-CM

## 2019-03-29 NOTE — Telephone Encounter (Signed)
Patient calls stating that she is scheduled for 11/25 however her husband is having minor surgery that day.  She is requesting to be rescheduled to another day.  A high priority scheduling message has been sent.

## 2019-03-29 NOTE — Telephone Encounter (Signed)
R/s appt per 11/19 sch message- pt aware of new appt date and time

## 2019-03-29 NOTE — Telephone Encounter (Signed)
Scheduled appt per 11/18 los.  Spoke with a member of the household and they are aware of the appt date and time.

## 2019-03-30 ENCOUNTER — Telehealth: Payer: Self-pay

## 2019-03-30 NOTE — Telephone Encounter (Signed)
Spoke with patient regarding the need to get ECHO with Herceptin treatment.  She refused to get ECHO stating it was unnecessary.  Dr. Burr Medico also spoke with the patient and she is addiment that she is not going to get ECHO.  She will keep her upcoming appointment on Tuesday 11/24.

## 2019-04-03 ENCOUNTER — Inpatient Hospital Stay: Payer: Medicare Other

## 2019-04-03 ENCOUNTER — Inpatient Hospital Stay (HOSPITAL_BASED_OUTPATIENT_CLINIC_OR_DEPARTMENT_OTHER): Payer: Medicare Other | Admitting: Hematology

## 2019-04-03 ENCOUNTER — Other Ambulatory Visit: Payer: Self-pay

## 2019-04-03 ENCOUNTER — Encounter: Payer: Self-pay | Admitting: Hematology

## 2019-04-03 VITALS — BP 180/62 | HR 88 | Temp 98.2°F | Resp 18 | Wt 128.8 lb

## 2019-04-03 DIAGNOSIS — Z85038 Personal history of other malignant neoplasm of large intestine: Secondary | ICD-10-CM | POA: Diagnosis not present

## 2019-04-03 DIAGNOSIS — I10 Essential (primary) hypertension: Secondary | ICD-10-CM | POA: Diagnosis not present

## 2019-04-03 DIAGNOSIS — Z79899 Other long term (current) drug therapy: Secondary | ICD-10-CM | POA: Diagnosis not present

## 2019-04-03 DIAGNOSIS — C50412 Malignant neoplasm of upper-outer quadrant of left female breast: Secondary | ICD-10-CM

## 2019-04-03 DIAGNOSIS — Z171 Estrogen receptor negative status [ER-]: Secondary | ICD-10-CM

## 2019-04-03 DIAGNOSIS — C773 Secondary and unspecified malignant neoplasm of axilla and upper limb lymph nodes: Secondary | ICD-10-CM | POA: Diagnosis not present

## 2019-04-03 DIAGNOSIS — Z5112 Encounter for antineoplastic immunotherapy: Secondary | ICD-10-CM | POA: Diagnosis present

## 2019-04-03 MED ORDER — DIPHENHYDRAMINE HCL 25 MG PO CAPS
ORAL_CAPSULE | ORAL | Status: AC
  Filled 2019-04-03: qty 2

## 2019-04-03 MED ORDER — DIPHENHYDRAMINE HCL 25 MG PO CAPS
50.0000 mg | ORAL_CAPSULE | Freq: Once | ORAL | Status: AC
  Administered 2019-04-03: 50 mg via ORAL

## 2019-04-03 MED ORDER — ACETAMINOPHEN 325 MG PO TABS
650.0000 mg | ORAL_TABLET | Freq: Once | ORAL | Status: AC
  Administered 2019-04-03: 650 mg via ORAL

## 2019-04-03 MED ORDER — TRASTUZUMAB-HYALURONIDASE-OYSK 600-10000 MG-UNT/5ML ~~LOC~~ SOLN
600.0000 mg | Freq: Once | SUBCUTANEOUS | Status: AC
  Administered 2019-04-03: 600 mg via SUBCUTANEOUS
  Filled 2019-04-03: qty 5

## 2019-04-03 MED ORDER — ACETAMINOPHEN 325 MG PO TABS
ORAL_TABLET | ORAL | Status: AC
  Filled 2019-04-03: qty 2

## 2019-04-03 NOTE — Progress Notes (Signed)
[facility]   Telephone:(336) [phone] [phone]   Clinic Follow up Note   Patient Care Team: [name], MD as PCP - General (Family Medicine)  Date of Service:  04/03/2019  CHIEF COMPLAINT: F/u left breast cancer  SUMMARY OF ONCOLOGIC HISTORY: Oncology History Overview Note  Cancer Staging Malignant neoplasm of upper-outer quadrant of left breast in female, estrogen receptor negative ([place]) Staging form: Breast, AJCC 8th Edition - Clinical stage from 10/11/2018: Stage IIB (cT2, cN1, cM0, G3, ER-, PR-, HER2+) - Signed by Truitt Merle, MD on 11/02/2018 - Clinical: No stage assigned - Unsigned    [name], stage II  09/2005 Initial Diagnosis   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 Findings were a 5.9 x 3.8 x 1.2 cm moderate to well-differentiated adenocarcinoma penetrating the muscular wall. Forty lymph nodes negative. No vascular or lymphatic invasion. Multiple diverticula with microabscess formation and inflammation. Carcinoma present in at least 1 diverticulum. Preop CEA 5.2 with lab normal range 0 to 2.5. Preop CT scan with no obvious additional pathology.    2007 -  Chemotherapy   She received oral Xeloda chemotherapy as an adjuvant. She declined treatment on a clinical trial.    11/12/2011 Initial Diagnosis   [name], stage II   12/09/2011 Procedure   Followup colonoscopy done on 12/09/2011. She was found to have 2 polyps which were removed. Chronic diverticulosis.     Malignant neoplasm of upper-outer quadrant of left breast in female, estrogen receptor negative ([place])  09/26/2018 Mammogram   Mammogram/US of left breast 09/26/18 IMPRESSION:  1. 2.9cm irregular mass in the upper outer left breast corresponds to the palpable abnormality. This is highly suspicious for breast carcinoma.  2. Two adjacent abnormal left axillary  Lymph nodes  suspicious for metastatic adenopathy. There is a third borderline abnormal left axillary LN with a cortex thickened to 27m.  3. Benign right breast cyst. No evidence of right breast malignancy.    10/11/2018 Cancer Staging   Staging form: Breast, AJCC 8th Edition - Clinical stage from 10/11/2018: Stage IIB (cT2, cN1, cM0, G3, ER-, PR-, HER2+) - Signed by [name] MD on 11/02/2018   10/11/2018 Initial Biopsy   Diagnosis 10/11/18 1. Breast, left, needle core biopsy, upper outer left 2 o'clock - INVASIVE DUCTAL CARCINOMA. - DUCTAL CARCINOMA IN SITU. - LYMPHOVASCULAR INVASION IS IDENTIFIED. - SEE COMMENT. 2. Lymph node, needle/core biopsy, left axilla - INVASIVE DUCTAL CARCINOMA. - SEE COMMENT.   10/11/2018 Receptors her2   The tumor cells are POSITIVE for Her2 (3+). Estrogen Receptor: 0%, NEGATIVE Progesterone Receptor: 0%, NEGATIVE Proliferation Marker Ki67: 70%   11/02/2018 Initial Diagnosis   Malignant neoplasm of upper-outer quadrant of left breast in female, estrogen receptor negative ([place]   11/15/2018 Breast MRI   MRI breast 11/15/18  IMPRESSION: 1. 3.3 centimeter mass in the LATERAL portion of the LEFT breast consistent with known malignancy. 2. There is significant non mass enhancement surrounding this mass and extending anteriorly into the nipple base, with largest diameter in the anterior to posterior axis, measuring 7.2 centimeters. 3. If the patient would consider breast conservation, additional MR guided core biopsies are recommended. Consider biopsy of the inferior and anterior extent of the non mass enhancement to document extent of disease. 4. Three enlarged LEFT axillary lymph nodes. 5. RIGHT breast is negative.   11/15/2018 PET scan   PET 11/15/18  IMPRESSION: Hypermetabolic left breast lesion compatible with known primary. Hypermetabolic left axillary lymph nodes are consistent with metastatic disease.   No evidence for additional hypermetabolic metastatic involvement  in the neck, chest, abdomen, or pelvis.   11/17/2018 - 03/01/2019 Chemotherapy   Neo-adjuvant Kadcyla and perjeta q3weeks for 6 cycles starting 11/17/18. Stopped before surgery    11/29/2018 Pathology Results   Diagnosis 1. Breast, left, needle core biopsy, inferior anterior (cylinder clip) - INVASIVE DUCTAL CARCINOMA. - DUCTAL CARCINOMA IN SITU. - LYMPHOVASCULAR INVASION IS IDENTIFIED. - SEE COMMENT. 2. Breast, left, needle core biopsy, central posterior (barbell clip) - INVASIVE DUCTAL CARCINOMA. - LYMPHOVASCULAR INVASION IS IDENTIFIED.   02/12/2019 Breast MRI   IMPRESSION: 1. Complete resolution of previously identified enhancing mass and associated non mass enhancement within the left breast. This is consistent with excellent response to chemotherapy. No residual or suspicious findings are identified. 2. No MRI evidence of malignancy on the right. 3. Previously identified left axillary lymphadenopathy not definitively seen on today's study. However, this may be due to decreased field-of-view compared to prior study.     03/14/2019 Cancer Staging   Staging form: Breast, AJCC 8th Edition - Pathologic stage from 03/14/2019: pT0, pN0, cM0, GX, ER: Unknown, PR: Unknown, HER2: Not Assessed - Signed by Truitt Merle, MD on 03/28/2019   03/14/2019 Surgery   LEFT MASTECTOMY WITH TARGETED LYMPH NODE DISSECTION and Left Axillary Sentinel Lymph  Node Biopsy by Dr [name] 03/14/19    03/14/2019 Pathology Results   FINAL MICROSCOPIC DIAGNOSIS:   A. LYMPH NODE, LEFT, SENTINEL, BIOPSY:  - There is no evidence of carcinoma in 1 of 1 lymph node (0/1).   B. BREAST, LEFT, MASTECTOMY:  - Benign breast parenchyma with treatment-related changes.  - There is no evidence of malignancy.  - See oncology table below.   C. LYMPH NODE, LEFT #1, SENTINEL, BIOPSY:  - There is no evidence of carcinoma in 1 of 1 lymph node (0/1).   D. LYMPH NODE, LEFT #2, SENTINEL, BIOPSY:  - There is no evidence of carcinoma  in 1 of 1 lymph node (0/1).     04/04/2019 -  Chemotherapy   Maintenance Herceptin injections every 3 weeks starting 04/03/19 to complete 1 year of treatment that was started in 11/2018.       CURRENT THERAPY:  Maintenance Herceptin injections every 3 weeks starting 04/03/19 to complete 1 year of treatment that was started in 11/2018.   INTERVAL HISTORY:  CAMELLIA POPESCU is here for a follow up. She presents to the clinic alone. She notes she is doing well and healed well from surgery. She notes her concern with her insurance covering her Echos. If it is not covered she does not want Echo, but is otherwise fine with proceeding. If continuing Herceptin is too difficult she rather not continue maintenance therapy.  She notes she will likely not proceed with radiation but is willing to discuss this with Rad Onc. She notes she is open with her family about her health and her decisions.    REVIEW OF SYSTEMS:   Constitutional: Denies fevers, chills or abnormal weight loss Eyes: Denies blurriness of vision Ears, nose, mouth, throat, and face: Denies mucositis or sore throat Respiratory: Denies cough, dyspnea or wheezes Cardiovascular: Denies palpitation, chest discomfort or lower extremity swelling Gastrointestinal:  Denies nausea, heartburn or change in bowel habits Skin: Denies abnormal skin rashes Lymphatics: Denies new lymphadenopathy or easy bruising Neurological:Denies numbness, tingling or new weaknesses Behavioral/Psych: Mood is stable, no new  changes  All other systems were reviewed with the patient and are negative.  MEDICAL HISTORY:  Past Medical History:  Diagnosis Date   Arthritis    shoulder   Cancer ([place])    left breast ca   [name], stage II 11/12/2011   Sigmoid lesion 5.9 cm  40 nodes negative but focus of cancer in a diverticum   Pre-op CEA 5.2 with lab normal up to 2.5 resected 09/21/05   Xeloda adjuvant chemotherapy   Hypertension     SURGICAL  HISTORY: Past Surgical History:  Procedure Laterality Date   COLONOSCOPY     HEMICOLECTOMY  2007   MASTECTOMY WITH AXILLARY LYMPH NODE DISSECTION Left 03/14/2019   Procedure: LEFT MASTECTOMY WITH TARGETED LYMPH NODE DISSECTION;  Surgeon: Jovita Kussmaul, MD;  Location: [facility];  Service: General;  Laterality: Left;   SENTINEL NODE BIOPSY Left 03/14/2019   Procedure: Left Axillary Sentinel Lymph  Node Biopsy;  Surgeon: Jovita Kussmaul, MD;  Location: [facility];  Service: General;  Laterality: Left;   TONSILLECTOMY     WISDOM TOOTH EXTRACTION      I have reviewed the social history and family history with the patient and they are unchanged from previous note.  ALLERGIES:  has No Known Allergies.  MEDICATIONS:  Current Outpatient Medications  Medication Sig Dispense Refill   amLODipine (NORVASC) 5 MG tablet Take 5 mg by mouth daily.      calcium citrate-vitamin D (CITRACAL+D) 315-200 MG-UNIT per tablet Take 1 tablet by mouth daily.     fish oil-omega-3 fatty acids 1000 MG capsule Take 1 g by mouth daily.     HYDROcodone-acetaminophen (NORCO/VICODIN) 5-325 MG tablet Take 1-2 tablets by mouth every 6 (six) hours as needed for moderate pain. 10 tablet 0   ondansetron (ZOFRAN) 8 MG tablet Take 1 tablet (8 mg total) by mouth every 8 (eight) hours as needed for nausea or vomiting. (Patient not taking: Reported on 03/07/2019) 20 tablet 2   No current facility-administered medications for this visit.    Facility-Administered Medications Ordered in Other Visits  Medication Dose Route Frequency Provider Last Rate Last Dose   trastuzumab-hyaluronidase-oysk (HERCEPTIN HYLECTA) 600-10000 MG-UNT/5ML chemo SQ injection 600 mg  600 mg Subcutaneous Once Truitt Merle, MD        PHYSICAL EXAMINATION: ECOG PERFORMANCE STATUS: 0 - Asymptomatic  Vitals with BMI 04/03/2019  Height   Weight 128 lbs 12 oz  BMI 30.07  Systolic 622  Diastolic 62  Pulse 88    GENERAL:alert, no distress and  comfortable SKIN: skin color, texture, turgor are normal, no rashes or significant lesions EYES: normal, Conjunctiva are pink and non-injected, sclera clear  NECK: supple, thyroid normal size, non-tender, without nodularity LYMPH:  no palpable lymphadenopathy in the cervical, axillary  LUNGS: clear to auscultation and percussion with normal breathing effort HEART: regular rate & rhythm and no murmurs and no lower extremity edema ABDOMEN:abdomen soft, non-tender and normal bowel sounds Musculoskeletal:no cyanosis of digits and no clubbing  NEURO: alert & oriented x 3 with fluent speech, no focal motor/sensory deficits BREAST: S/p left mastectomy: Surgical incision healed well. No palpable mass, nodules or adenopathy bilaterally. Right breast exam benign.   LABORATORY DATA:  I have reviewed the data as listed CBC Latest Ref Rng & Units 03/01/2019 02/09/2019 01/19/2019  WBC 4.0 - 10.5 K/uL 5.6 6.5 5.6  Hemoglobin 12.0 - 15.0 g/dL 13.8 13.6 12.9  Hematocrit 36.0 - 46.0 % 42.8 40.4 37.7  Platelets 150 -  400 K/uL 144(L) 182 185     CMP Latest Ref Rng & Units 03/01/2019 02/09/2019 01/19/2019  Glucose 70 - 99 mg/dL 107(H) 102(H) 111(H)  BUN 8 - 23 mg/dL _0 Creatinine 0.44 - 1.00 mg/dL 0.79 0.76 0.84  Sodium 135 - 145 mmol/L 139 141 138  Potassium 3.5 - 5.1 mmol/L 3.9 4.0 4.1  Chloride 98 - 111 mmol/L 106 107 105  CO2 22 - 32 mmol/L _1 Calcium 8.9 - 10.3 mg/dL 9.8 9.8 9.5  Total Protein 6.5 - 8.1 g/dL 7.7 7.8 7.5  Total Bilirubin 0.3 - 1.2 mg/dL 0.4 0.4 0.4  Alkaline Phos 38 - 126 U/L 90 98 92  AST 15 - 41 U/L 52(H) 54(H) 52(H)  ALT 0 - 44 U/L 38 43 40      RADIOGRAPHIC STUDIES: I have personally reviewed the radiological images as listed and agreed with the findings in the report. No results found.   ASSESSMENT & PLAN:  Jasmin Lewis is a 85 y.o. female with   1.Malignant neoplasm of upper-outer quadrant of left breast,invasive ductal carcinoma, stageIIB,  c(T2,N1,M0), ER-/PR-, HER2+, Grade3, ypT0N0 -She was diagnosed in 10/2018.Shepresented with invasive ductal carcinomawith left axillary node metastasis. 2 abnormal LNs were seen on [place], 1 was biopsied.Her tumor is negative for ER and PR, HER2 positive, which is more aggressive. Staging PET was negative for distant metastasis -She completed 6 cycles of Neoadjuvant Kadcylaand perjetabefore proceeding with left mastectomy with targeted LN dissection by Dr [name] on 03/14/19. Her pathology which showed complete response to chemo and no residual tumor in breast and nodes.  -Her initial biopsy was T2N1 but had complete axillary response. I discussed her option of post mastectomy radiation. She is open to consult with Rad Onc although she may not proceed with adjuvant radiation.  -Given her ER/PR negative disease she does not need antiestrogen therapy.  -I again reviewed her small risk of cancer recurrence, even with complete past chemotherapy, and the benefit of maintenance Herceptin, I strongly encouraged her to continue Herceptin to complete 1 year therapy. -We discussed the risk of cardiomyopathy from Herceptin, and the mandatory requirement for monitoring of her heart by echo. She showed me a statement from her insurance (Medicare) that echo is not covered. Her brother, a retired Materials engineer also told her that echo does not have to be done if she is asymptomatic.  I reviewed that many patients are asymptomatic even with the drop of EF on echo, and I strongly recommend her to repeat echo for safety issue. After a lengthy discussion, she agrees to do echo if her insurance covers completely. I will let care management team check with her insurance. If it's not covered, pt would like to discontinue Herceptin. I will also not offer Herceptin if she declines echo monitoring.  -Today we will proceed with start of Maintenance Herceptin injections  -will call her after we figure out her echo coverage, to determine  next step -We discussed her breast cancer surveillance, she is agreeable with routine follow-up in the lab.  2. H/o stage IIadenocarcinoma of the sigmoid colon diagnosed in May 2007 -S/p surgicalresectionon 09/21/05.S/p adjuvant Xeloda.   3. Genetic Testing  -Given her history of cancer and sister had colon cancer, I recommend genetic testing to rule out inheritable mutations. Genetic referralpreviouslysent, pt wanted to hold off for now     PLAN: -Labs reviewed and adequate to proceed with Herceptin injection today  -I will call her back after  checking her insurance coverage for echo    No problem-specific Assessment & Plan notes found for this encounter.   [name].  All questions were answered. The patient knows to call the clinic with any problems, questions or concerns. No barriers to learning was detected. I spent 20 minutes counseling the patient face to face. The total time spent in the appointment was 25 minutes and more than 50% was on counseling and review of test results     Truitt Merle, MD 04/03/2019   I, Joslyn Devon, am acting as scribe for Truitt Merle, MD.   I have reviewed the above documentation for accuracy and completeness, and I agree with the above.

## 2019-04-03 NOTE — Progress Notes (Signed)
Per Dr. Burr Medico, okay to continue with treatment today with elevated BP and most recent echo.

## 2019-04-03 NOTE — Patient Instructions (Addendum)
[facility] Discharge Instructions for Patients Receiving Chemotherapy  Today you received the following chemotherapy agents: Herceptin Hylecta.  To help prevent nausea and vomiting after your treatment, we encourage you to take your nausea medication as directed.   If you develop nausea and vomiting that is not controlled by your nausea medication, call the clinic.   BELOW ARE SYMPTOMS THAT SHOULD BE REPORTED IMMEDIATELY:  *FEVER GREATER THAN 100.5 F  *CHILLS WITH OR WITHOUT FEVER  NAUSEA AND VOMITING THAT IS NOT CONTROLLED WITH YOUR NAUSEA MEDICATION  *UNUSUAL SHORTNESS OF BREATH  *UNUSUAL BRUISING OR BLEEDING  TENDERNESS IN MOUTH AND THROAT WITH OR WITHOUT PRESENCE OF ULCERS  *URINARY PROBLEMS  *BOWEL PROBLEMS  UNUSUAL RASH Items with * indicate a potential emergency and should be followed up as soon as possible.  Feel free to call the clinic should you have any questions or concerns. The clinic phone number is [phone].  Please show the [facility] at check-in to the Emergency Department and triage nurse.  Trastuzumab; Hyaluronidase injection What is this medicine? TRASTUZUMAB; HYALURONIDASE (tras TOO zoo mab / hye al ur ON i dase) is used to treat breast cancer and stomach cancer. Trastuzumab is a monoclonal antibody. Hyaluronidase is used to improve the effects of trastuzumab. This medicine may be used for other purposes; ask your health care provider or pharmacist if you have questions. COMMON BRAND NAME(S): HERCEPTIN HYLECTA What should I tell my health care provider before I take this medicine? They need to know if you have any of these conditions:  heart disease  heart failure  lung or breathing disease, like asthma  an unusual or allergic reaction to trastuzumab, or other medications, foods, dyes, or preservatives  pregnant or trying to get pregnant  breast-feeding How should I use this medicine? This medicine is for injection  under the skin. It is given by a health care professional in a hospital or clinic setting. Talk to your pediatrician regarding the use of this medicine in children. This medicine is not approved for use in children. Overdosage: If you think you have taken too much of this medicine contact a poison control center or emergency room at once. NOTE: This medicine is only for you. Do not share this medicine with others. What if I miss a dose? It is important not to miss a dose. Call your doctor or health care professional if you are unable to keep an appointment. What may interact with this medicine? This medicine may interact with the following medications:  certain types of chemotherapy, such as daunorubicin, doxorubicin, epirubicin, and idarubicin This list may not describe all possible interactions. Give your health care provider a list of all the medicines, herbs, non-prescription drugs, or dietary supplements you use. Also tell them if you smoke, drink alcohol, or use illegal drugs. Some items may interact with your medicine. What should I watch for while using this medicine? Visit your doctor for checks on your progress. Report any side effects. Continue your course of treatment even though you feel ill unless your doctor tells you to stop. Call your doctor or health care professional for advice if you get a fever, chills or sore throat, or other symptoms of a cold or flu. Do not treat yourself. Try to avoid being around people who are sick. You may experience fever, chills and shaking during your first infusion. These effects are usually mild and can be treated with other medicines. Report any side effects during the infusion  to your health care professional. Fever and chills usually do not happen with later infusions. Do not become pregnant while taking this medicine or for 7 months after stopping it. Women should inform their doctor if they wish to become pregnant or think they might be pregnant.  Women of child-bearing potential will need to have a negative pregnancy test before starting this medicine. There is a potential for serious side effects to an unborn child. Talk to your health care professional or pharmacist for more information. Do not breast-feed an infant while taking this medicine or for 7 months after stopping it. What side effects may I notice from receiving this medicine? Side effects that you should report to your doctor or health care professional as soon as possible:  allergic reactions like skin rash, itching or hives, swelling of the face, lips, or tongue  breathing problems  chest pain or palpitations  cough  fever  general ill feeling or flu-like symptoms  signs of worsening heart failure like breathing problems; swelling in your legs and feet Side effects that usually do not require medical attention (report these to your doctor or health care professional if they continue or are bothersome):  bone pain  changes in taste  diarrhea  joint pain  nausea/vomiting  unusually weak or tired  weight loss This list may not describe all possible side effects. Call your doctor for medical advice about side effects. You may report side effects to FDA at 1-800-FDA-1088. Where should I keep my medicine? This drug is given in a hospital or clinic and will not be stored at home. NOTE: This sheet is a summary. It may not cover all possible information. If you have questions about this medicine, talk to your doctor, pharmacist, or health care provider.  2020 Elsevier/Gold Standard (2017-07-15 21:54:17)

## 2019-04-04 ENCOUNTER — Telehealth: Payer: Self-pay | Admitting: *Deleted

## 2019-04-04 ENCOUNTER — Telehealth: Payer: Self-pay | Admitting: Hematology

## 2019-04-04 ENCOUNTER — Ambulatory Visit: Payer: Medicare Other

## 2019-04-04 NOTE — Telephone Encounter (Signed)
No los per 11/24. °

## 2019-04-13 ENCOUNTER — Ambulatory Visit: Payer: Medicare Other | Admitting: Hematology

## 2019-04-13 ENCOUNTER — Other Ambulatory Visit: Payer: Medicare Other

## 2019-04-23 NOTE — Progress Notes (Signed)
[facility]   [phone] Fax:(336) [phone]   Clinic Follow up Note   Patient Care Team: [name], MD as PCP - General (Family Medicine)  Date of Service:  04/25/2019  CHIEF COMPLAINT: F/u left breast cancer  SUMMARY OF ONCOLOGIC HISTORY: Oncology History Overview Note  Cancer Staging Malignant neoplasm of upper-outer quadrant of left breast in female, estrogen receptor negative ([place]) Staging form: Breast, AJCC 8th Edition - Clinical stage from 10/11/2018: Stage IIB (cT2, cN1, cM0, G3, ER-, PR-, HER2+) - Signed by Truitt Merle, MD on 11/02/2018 - Clinical: No stage assigned - Unsigned    [name], stage II  09/2005 Initial Diagnosis   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 Findings were a 5.9 x 3.8 x 1.2 cm moderate to well-differentiated adenocarcinoma penetrating the muscular wall. Forty lymph nodes negative. No vascular or lymphatic invasion. Multiple diverticula with microabscess formation and inflammation. Carcinoma present in at least 1 diverticulum. Preop CEA 5.2 with lab normal range 0 to 2.5. Preop CT scan with no obvious additional pathology.    2007 -  Chemotherapy   She received oral Xeloda chemotherapy as an adjuvant. She declined treatment on a clinical trial.    11/12/2011 Initial Diagnosis   [name], stage II   12/09/2011 Procedure   Followup colonoscopy done on 12/09/2011. She was found to have 2 polyps which were removed. Chronic diverticulosis.     Malignant neoplasm of upper-outer quadrant of left breast in female, estrogen receptor negative ([place])  09/26/2018 Mammogram   Mammogram/US of left breast 09/26/18 IMPRESSION:  1. 2.9cm irregular mass in the upper outer left breast corresponds to the palpable abnormality. This is highly suspicious for breast carcinoma.  2. Two adjacent abnormal left axillary  Lymph nodes  suspicious for metastatic adenopathy. There is a third borderline abnormal left axillary LN with a cortex thickened to 38m.  3. Benign right breast cyst. No evidence of right breast malignancy.    10/11/2018 Cancer Staging   Staging form: Breast, AJCC 8th Edition - Clinical stage from 10/11/2018: Stage IIB (cT2, cN1, cM0, G3, ER-, PR-, HER2+) - Signed by [name] MD on 11/02/2018   10/11/2018 Initial Biopsy   Diagnosis 10/11/18 1. Breast, left, needle core biopsy, upper outer left 2 o'clock - INVASIVE DUCTAL CARCINOMA. - DUCTAL CARCINOMA IN SITU. - LYMPHOVASCULAR INVASION IS IDENTIFIED. - SEE COMMENT. 2. Lymph node, needle/core biopsy, left axilla - INVASIVE DUCTAL CARCINOMA. - SEE COMMENT.   10/11/2018 Receptors her2   The tumor cells are POSITIVE for Her2 (3+). Estrogen Receptor: 0%, NEGATIVE Progesterone Receptor: 0%, NEGATIVE Proliferation Marker Ki67: 70%   11/02/2018 Initial Diagnosis   Malignant neoplasm of upper-outer quadrant of left breast in female, estrogen receptor negative ([place]   11/15/2018 Breast MRI   MRI breast 11/15/18  IMPRESSION: 1. 3.3 centimeter mass in the LATERAL portion of the LEFT breast consistent with known malignancy. 2. There is significant non mass enhancement surrounding this mass and extending anteriorly into the nipple base, with largest diameter in the anterior to posterior axis, measuring 7.2 centimeters. 3. If the patient would consider breast conservation, additional MR guided core biopsies are recommended. Consider biopsy of the inferior and anterior extent of the non mass enhancement to document extent of disease. 4. Three enlarged LEFT axillary lymph nodes. 5. RIGHT breast is negative.   11/15/2018 PET scan   PET  11/15/18 IMPRESSION: Hypermetabolic left breast lesion compatible with known primary. Hypermetabolic left axillary lymph nodes are consistent with metastatic disease.   No evidence for additional hypermetabolic metastatic involvement  in the neck, chest, abdomen, or pelvis.   11/17/2018 - 03/01/2019 Chemotherapy   Neo-adjuvant Kadcyla and perjeta q3weeks for 6 cycles starting 11/17/18. Stopped before surgery    11/29/2018 Pathology Results   Diagnosis 1. Breast, left, needle core biopsy, inferior anterior (cylinder clip) - INVASIVE DUCTAL CARCINOMA. - DUCTAL CARCINOMA IN SITU. - LYMPHOVASCULAR INVASION IS IDENTIFIED. - SEE COMMENT. 2. Breast, left, needle core biopsy, central posterior (barbell clip) - INVASIVE DUCTAL CARCINOMA. - LYMPHOVASCULAR INVASION IS IDENTIFIED.   02/12/2019 Breast MRI   IMPRESSION: 1. Complete resolution of previously identified enhancing mass and associated non mass enhancement within the left breast. This is consistent with excellent response to chemotherapy. No residual or suspicious findings are identified. 2. No MRI evidence of malignancy on the right. 3. Previously identified left axillary lymphadenopathy not definitively seen on today's study. However, this may be due to decreased field-of-view compared to prior study.     03/14/2019 Cancer Staging   Staging form: Breast, AJCC 8th Edition - Pathologic stage from 03/14/2019: pT0, pN0, cM0, GX, ER: Unknown, PR: Unknown, HER2: Not Assessed - Signed by Truitt Merle, MD on 03/28/2019   03/14/2019 Surgery   LEFT MASTECTOMY WITH TARGETED LYMPH NODE DISSECTION and Left Axillary Sentinel Lymph  Node Biopsy by Dr [name] 03/14/19    03/14/2019 Pathology Results   FINAL MICROSCOPIC DIAGNOSIS:   A. LYMPH NODE, LEFT, SENTINEL, BIOPSY:  - There is no evidence of carcinoma in 1 of 1 lymph node (0/1).   B. BREAST, LEFT, MASTECTOMY:  - Benign breast parenchyma with treatment-related changes.  - There is no evidence of malignancy.  - See oncology table below.   C. LYMPH NODE, LEFT #1, SENTINEL, BIOPSY:  - There is no evidence of carcinoma in 1 of 1 lymph node (0/1).   D. LYMPH NODE, LEFT #2, SENTINEL, BIOPSY:  - There is no evidence of carcinoma  in 1 of 1 lymph node (0/1).     04/04/2019 - 04/25/2019 Chemotherapy   Maintenance Herceptin injections every 3 weeks starting 04/03/19 to complete 1 year of treatment that was started in 11/2018. Stopped after 2nd dose as she will not be repeating Echos.       CURRENT THERAPY:  Surveillance   INTERVAL HISTORY:  Jasmin Lewis is here for a follow up. She presents to the clinic alone. She notes she is doing well. She notes she is willing to stop Herceptin given not repeating Echos. She notes not having breast pain and she has healed well from surgery.    REVIEW OF SYSTEMS:   Constitutional: Denies fevers, chills or abnormal weight loss Eyes: Denies blurriness of vision Ears, nose, mouth, throat, and face: Denies mucositis or sore throat Respiratory: Denies cough, dyspnea or wheezes Cardiovascular: Denies palpitation, chest discomfort or lower extremity swelling Gastrointestinal:  Denies nausea, heartburn or change in bowel habits Skin: Denies abnormal skin rashes Lymphatics: Denies new lymphadenopathy or easy bruising Neurological:Denies numbness, tingling or new weaknesses Behavioral/Psych: Mood is stable, no new changes  All other systems were reviewed with the patient and are negative.   MEDICAL HISTORY:  Past Medical History:  Diagnosis Date   Arthritis    shoulder   Cancer ([place])    left breast ca   [name], stage II 11/12/2011   Sigmoid lesion 5.9 cm  40  nodes negative but focus of cancer in a diverticum   Pre-op CEA 5.2 with lab normal up to 2.5 resected 09/21/05   Xeloda adjuvant chemotherapy   Hypertension     SURGICAL HISTORY: Past Surgical History:  Procedure Laterality Date   COLONOSCOPY     HEMICOLECTOMY  2007   MASTECTOMY WITH AXILLARY LYMPH NODE DISSECTION Left 03/14/2019   Procedure: LEFT MASTECTOMY WITH TARGETED LYMPH NODE DISSECTION;  Surgeon: Jovita Kussmaul, MD;  Location: [facility];  Service: General;  Laterality: Left;   SENTINEL NODE  BIOPSY Left 03/14/2019   Procedure: Left Axillary Sentinel Lymph  Node Biopsy;  Surgeon: Jovita Kussmaul, MD;  Location: [facility];  Service: General;  Laterality: Left;   TONSILLECTOMY     WISDOM TOOTH EXTRACTION      I have reviewed the social history and family history with the patient and they are unchanged from previous note.  ALLERGIES:  has No Known Allergies.  MEDICATIONS:  Current Outpatient Medications  Medication Sig Dispense Refill   amLODipine (NORVASC) 5 MG tablet Take 5 mg by mouth daily.      calcium citrate-vitamin D (CITRACAL+D) 315-200 MG-UNIT per tablet Take 1 tablet by mouth daily.     fish oil-omega-3 fatty acids 1000 MG capsule Take 1 g by mouth daily.     ondansetron (ZOFRAN) 8 MG tablet Take 1 tablet (8 mg total) by mouth every 8 (eight) hours as needed for nausea or vomiting. (Patient not taking: Reported on 03/07/2019) 20 tablet 2   No current facility-administered medications for this visit.   Facility-Administered Medications Ordered in Other Visits  Medication Dose Route Frequency Provider Last Rate Last Admin   0.9 %  sodium chloride infusion   Intravenous Once Truitt Merle, MD        PHYSICAL EXAMINATION: ECOG PERFORMANCE STATUS: 0 - Asymptomatic  Vitals:   04/25/19 1107  BP: (!) 197/77  Pulse: 81  Resp: 18  Temp: 98.2 F (36.8 C)  SpO2: 95%   Filed Weights   04/25/19 1107  Weight: 132 lb 12.8 oz (60.2 kg)    GENERAL:alert, no distress and comfortable SKIN: skin color, texture, turgor are normal, no rashes or significant lesions EYES: normal, Conjunctiva are pink and non-injected, sclera clear  NECK: supple, thyroid normal size, non-tender, without nodularity LYMPH:  no palpable lymphadenopathy in the cervical, axillary  LUNGS: clear to auscultation and percussion with normal breathing effort HEART: regular rate & rhythm and no murmurs and no lower extremity edema ABDOMEN:abdomen soft, non-tender and normal bowel  sounds Musculoskeletal:no cyanosis of digits and no clubbing  NEURO: alert & oriented x 3 with fluent speech, no focal motor/sensory deficits  LABORATORY DATA:  I have reviewed the data as listed CBC Latest Ref Rng & Units 04/25/2019 03/01/2019 02/09/2019  WBC 4.0 - 10.5 K/uL 5.8 5.6 6.5  Hemoglobin 12.0 - 15.0 g/dL 13.7 13.8 13.6  Hematocrit 36.0 - 46.0 % 41.8 42.8 40.4  Platelets 150 - 400 K/uL 150 144(L) 182     CMP Latest Ref Rng & Units 04/25/2019 03/01/2019 02/09/2019  Glucose 70 - 99 mg/dL 104(H) 107(H) 102(H)  BUN 8 - 23 mg/dL _0 Creatinine 0.44 - 1.00 mg/dL 0.90 0.79 0.76  Sodium 135 - 145 mmol/L 141 139 141  Potassium 3.5 - 5.1 mmol/L 4.3 3.9 4.0  Chloride 98 - 111 mmol/L 105 106 107  CO2 22 - 32 mmol/L _1 Calcium 8.9 - 10.3 mg/dL 9.4 9.8 9.8  Total Protein 6.5 - 8.1 g/dL 7.4 7.7 7.8  Total Bilirubin 0.3 - 1.2 mg/dL 0.5 0.4 0.4  Alkaline Phos 38 - 126 U/L 90 90 98  AST 15 - 41 U/L 41 52(H) 54(H)  ALT 0 - 44 U/L 29 38 43      RADIOGRAPHIC STUDIES: I have personally reviewed the radiological images as listed and agreed with the findings in the report. No results found.   ASSESSMENT & PLAN:  KALENE CUTLER is a 85 y.o. 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  -she had complete pathological response , no review tumor in the breast or lymph nodes on surgical path. Due to her advanced age and completed response to chemo, adjuvant radiation was not recommended. -Given her ER/PR negative disease she does not need antiestrogen therapy.  -I started her on Herceptin maintenance therapy 3 weeks ago, unfortunately she is adamant about not having echo monitoring during her  Herceptin therapy, and I do not feel it's safe to continue Herceptin without cardiac monitoring due to the potential severe heart failure. I willstop after today's dose.  She prefers not to have any more treatment anyway. -She is clinically doing well. Labs reviewed and adequate to proceed with Last Herceptin injection today.  -We discussed the breast cancer surveillance. She will continue annual screening mammogram, self exam, and a routine office visit with lab and exam with [place]. -Next mammogram in 09/2018 and f/u in 6 months   2. H/o stage IIadenocarcinoma of the sigmoid colon diagnosed in May 2007 -S/p surgicalresectionon 09/21/05.S/p adjuvant Xeloda.   3. Genetic Testing  -Given her history of cancer and sister had colon cancer, I recommend genetic testing to rule out inheritable mutations. Genetic referralpreviouslysent,pt wanted to hold off for now   PLAN: -Labs reviewed and adequate to proceed with Herceptin injection today, plan to stop after today, because she does not agree with cardiac monitoring (echo) during Herceptin therapy   -Next mammogram in 6 month, will order today  -Lab and f/u in 6 months    No problem-specific Assessment & Plan notes found for this encounter.   [name].  All questions were answered. The patient knows to call the clinic with any problems, questions or concerns. No barriers to learning was detected. I spent 15 minutes counseling the patient face to face. The total time spent in the appointment was 20 minutes and more than 50% was on counseling and review of test results     Truitt Merle, MD 04/25/2019   I, Joslyn Devon, am acting as scribe for Truitt Merle, MD.   I have reviewed the above documentation for accuracy and completeness, and I agree with the above.

## 2019-04-25 ENCOUNTER — Inpatient Hospital Stay (HOSPITAL_BASED_OUTPATIENT_CLINIC_OR_DEPARTMENT_OTHER): Payer: Medicare Other | Admitting: Hematology

## 2019-04-25 ENCOUNTER — Inpatient Hospital Stay: Payer: Medicare Other | Attending: Hematology

## 2019-04-25 ENCOUNTER — Other Ambulatory Visit: Payer: Self-pay

## 2019-04-25 ENCOUNTER — Encounter: Payer: Self-pay | Admitting: Hematology

## 2019-04-25 ENCOUNTER — Inpatient Hospital Stay: Payer: Medicare Other

## 2019-04-25 VITALS — BP 197/77 | HR 81 | Temp 98.2°F | Resp 18 | Ht 63.0 in | Wt 132.8 lb

## 2019-04-25 VITALS — BP 163/72 | HR 74

## 2019-04-25 DIAGNOSIS — Z5112 Encounter for antineoplastic immunotherapy: Secondary | ICD-10-CM | POA: Insufficient documentation

## 2019-04-25 DIAGNOSIS — Z853 Personal history of malignant neoplasm of breast: Secondary | ICD-10-CM | POA: Diagnosis not present

## 2019-04-25 DIAGNOSIS — Z79899 Other long term (current) drug therapy: Secondary | ICD-10-CM | POA: Insufficient documentation

## 2019-04-25 DIAGNOSIS — Z85038 Personal history of other malignant neoplasm of large intestine: Secondary | ICD-10-CM | POA: Diagnosis not present

## 2019-04-25 DIAGNOSIS — C50412 Malignant neoplasm of upper-outer quadrant of left female breast: Secondary | ICD-10-CM

## 2019-04-25 DIAGNOSIS — Z171 Estrogen receptor negative status [ER-]: Secondary | ICD-10-CM

## 2019-04-25 DIAGNOSIS — I10 Essential (primary) hypertension: Secondary | ICD-10-CM | POA: Insufficient documentation

## 2019-04-25 LAB — CMP (CANCER CENTER ONLY)
ALT: 29 U/L (ref 0–44)
AST: 41 U/L (ref 15–41)
Albumin: 3.9 g/dL (ref 3.5–5.0)
Alkaline Phosphatase: 90 U/L (ref 38–126)
Anion gap: 9 (ref 5–15)
BUN: 17 mg/dL (ref 8–23)
CO2: 27 mmol/L (ref 22–32)
Calcium: 9.4 mg/dL (ref 8.9–10.3)
Chloride: 105 mmol/L (ref 98–111)
Creatinine: 0.9 mg/dL (ref 0.44–1.00)
GFR, Est AFR Am: >60 mL/min (ref >60)
GFR, Estimated: 58 mL/min — ABNORMAL LOW (ref >60)
Glucose, Bld: 104 mg/dL — ABNORMAL HIGH (ref 70–99)
Potassium: 4.3 mmol/L (ref 3.5–5.1)
Sodium: 141 mmol/L (ref 135–145)
Total Bilirubin: 0.5 mg/dL (ref 0.3–1.2)
Total Protein: 7.4 g/dL (ref 6.5–8.1)

## 2019-04-25 LAB — CBC WITH DIFFERENTIAL (CANCER CENTER ONLY)
Abs Immature Granulocytes: 0.02 10*3/uL (ref 0.00–0.07)
Basophils Absolute: 0 10*3/uL (ref 0.0–0.1)
Basophils Relative: 1 %
Eosinophils Absolute: 0.1 10*3/uL (ref 0.0–0.5)
Eosinophils Relative: 1 %
HCT: 41.8 % (ref 36.0–46.0)
Hemoglobin: 13.7 g/dL (ref 12.0–15.0)
Immature Granulocytes: 0 %
Lymphocytes Relative: 39 %
Lymphs Abs: 2.2 10*3/uL (ref 0.7–4.0)
MCH: 31.4 pg (ref 26.0–34.0)
MCHC: 32.8 g/dL (ref 30.0–36.0)
MCV: 95.9 fL (ref 80.0–100.0)
Monocytes Absolute: 0.6 10*3/uL (ref 0.1–1.0)
Monocytes Relative: 10 %
Neutro Abs: 2.9 10*3/uL (ref 1.7–7.7)
Neutrophils Relative %: 49 %
Platelet Count: 150 10*3/uL (ref 150–400)
RBC: 4.36 MIL/uL (ref 3.87–5.11)
RDW: 13.6 % (ref 11.5–15.5)
WBC Count: 5.8 10*3/uL (ref 4.0–10.5)
nRBC: 0 % (ref 0.0–0.2)

## 2019-04-25 MED ORDER — ACETAMINOPHEN 325 MG PO TABS
ORAL_TABLET | ORAL | Status: AC
  Filled 2019-04-25: qty 2

## 2019-04-25 MED ORDER — DIPHENHYDRAMINE HCL 25 MG PO CAPS
50.0000 mg | ORAL_CAPSULE | Freq: Once | ORAL | Status: AC
  Administered 2019-04-25: 50 mg via ORAL

## 2019-04-25 MED ORDER — TRASTUZUMAB-HYALURONIDASE-OYSK 600-10000 MG-UNT/5ML ~~LOC~~ SOLN
600.0000 mg | Freq: Once | SUBCUTANEOUS | Status: AC
  Administered 2019-04-25: 12:00:00 600 mg via SUBCUTANEOUS
  Filled 2019-04-25: qty 5

## 2019-04-25 MED ORDER — ACETAMINOPHEN 325 MG PO TABS
650.0000 mg | ORAL_TABLET | Freq: Once | ORAL | Status: AC
  Administered 2019-04-25: 12:00:00 650 mg via ORAL

## 2019-04-25 MED ORDER — DIPHENHYDRAMINE HCL 25 MG PO CAPS
ORAL_CAPSULE | ORAL | Status: AC
  Filled 2019-04-25: qty 2

## 2019-04-25 MED ORDER — SODIUM CHLORIDE 0.9 % IV SOLN
Freq: Once | INTRAVENOUS | Status: DC
  Filled 2019-04-25: qty 250

## 2019-04-25 NOTE — Patient Instructions (Signed)
[facility] Discharge Instructions for Patients Receiving Chemotherapy  Today you received the following chemotherapy agents: Herceptin Hylecta.  To help prevent nausea and vomiting after your treatment, we encourage you to take your nausea medication as directed.   If you develop nausea and vomiting that is not controlled by your nausea medication, call the clinic.   BELOW ARE SYMPTOMS THAT SHOULD BE REPORTED IMMEDIATELY:  *FEVER GREATER THAN 100.5 F  *CHILLS WITH OR WITHOUT FEVER  NAUSEA AND VOMITING THAT IS NOT CONTROLLED WITH YOUR NAUSEA MEDICATION  *UNUSUAL SHORTNESS OF BREATH  *UNUSUAL BRUISING OR BLEEDING  TENDERNESS IN MOUTH AND THROAT WITH OR WITHOUT PRESENCE OF ULCERS  *URINARY PROBLEMS  *BOWEL PROBLEMS  UNUSUAL RASH Items with * indicate a potential emergency and should be followed up as soon as possible.  Feel free to call the clinic should you have any questions or concerns. The clinic phone number is [phone].  Please show the [facility] at check-in to the Emergency Department and triage nurse.  Trastuzumab; Hyaluronidase injection What is this medicine? TRASTUZUMAB; HYALURONIDASE (tras TOO zoo mab / hye al ur ON i dase) is used to treat breast cancer and stomach cancer. Trastuzumab is a monoclonal antibody. Hyaluronidase is used to improve the effects of trastuzumab. This medicine may be used for other purposes; ask your health care provider or pharmacist if you have questions. COMMON BRAND NAME(S): HERCEPTIN HYLECTA What should I tell my health care provider before I take this medicine? They need to know if you have any of these conditions:  heart disease  heart failure  lung or breathing disease, like asthma  an unusual or allergic reaction to trastuzumab, or other medications, foods, dyes, or preservatives  pregnant or trying to get pregnant  breast-feeding How should I use this medicine? This medicine is for injection  under the skin. It is given by a health care professional in a hospital or clinic setting. Talk to your pediatrician regarding the use of this medicine in children. This medicine is not approved for use in children. Overdosage: If you think you have taken too much of this medicine contact a poison control center or emergency room at once. NOTE: This medicine is only for you. Do not share this medicine with others. What if I miss a dose? It is important not to miss a dose. Call your doctor or health care professional if you are unable to keep an appointment. What may interact with this medicine? This medicine may interact with the following medications:  certain types of chemotherapy, such as daunorubicin, doxorubicin, epirubicin, and idarubicin This list may not describe all possible interactions. Give your health care provider a list of all the medicines, herbs, non-prescription drugs, or dietary supplements you use. Also tell them if you smoke, drink alcohol, or use illegal drugs. Some items may interact with your medicine. What should I watch for while using this medicine? Visit your doctor for checks on your progress. Report any side effects. Continue your course of treatment even though you feel ill unless your doctor tells you to stop. Call your doctor or health care professional for advice if you get a fever, chills or sore throat, or other symptoms of a cold or flu. Do not treat yourself. Try to avoid being around people who are sick. You may experience fever, chills and shaking during your first infusion. These effects are usually mild and can be treated with other medicines. Report any side effects during the infusion  to your health care professional. Fever and chills usually do not happen with later infusions. Do not become pregnant while taking this medicine or for 7 months after stopping it. Women should inform their doctor if they wish to become pregnant or think they might be pregnant.  Women of child-bearing potential will need to have a negative pregnancy test before starting this medicine. There is a potential for serious side effects to an unborn child. Talk to your health care professional or pharmacist for more information. Do not breast-feed an infant while taking this medicine or for 7 months after stopping it. What side effects may I notice from receiving this medicine? Side effects that you should report to your doctor or health care professional as soon as possible:  allergic reactions like skin rash, itching or hives, swelling of the face, lips, or tongue  breathing problems  chest pain or palpitations  cough  fever  general ill feeling or flu-like symptoms  signs of worsening heart failure like breathing problems; swelling in your legs and feet Side effects that usually do not require medical attention (report these to your doctor or health care professional if they continue or are bothersome):  bone pain  changes in taste  diarrhea  joint pain  nausea/vomiting  unusually weak or tired  weight loss This list may not describe all possible side effects. Call your doctor for medical advice about side effects. You may report side effects to FDA at 1-800-FDA-1088. Where should I keep my medicine? This drug is given in a hospital or clinic and will not be stored at home. NOTE: This sheet is a summary. It may not cover all possible information. If you have questions about this medicine, talk to your doctor, pharmacist, or health care provider.  2020 Elsevier/Gold Standard (2017-07-15 21:54:17)

## 2019-04-26 ENCOUNTER — Telehealth: Payer: Self-pay | Admitting: Hematology

## 2019-04-26 LAB — CANCER ANTIGEN 27.29: CA 27.29: 29.8 U/mL (ref 0.0–38.6)

## 2019-04-26 NOTE — Telephone Encounter (Signed)
Scheduled appt per 12/16 los.  Left a vm of the appt date and time. 

## 2019-04-27 ENCOUNTER — Other Ambulatory Visit: Payer: Self-pay | Admitting: Hematology

## 2019-04-27 ENCOUNTER — Other Ambulatory Visit: Payer: Self-pay

## 2019-04-27 DIAGNOSIS — Z171 Estrogen receptor negative status [ER-]: Secondary | ICD-10-CM

## 2019-04-27 DIAGNOSIS — C50412 Malignant neoplasm of upper-outer quadrant of left female breast: Secondary | ICD-10-CM

## 2019-10-24 ENCOUNTER — Other Ambulatory Visit: Payer: Medicare Other

## 2019-10-24 ENCOUNTER — Ambulatory Visit: Payer: Medicare Other | Admitting: Hematology

## 2020-10-28 IMAGING — MR MR BREAST BIOPSY
7 of 10 series · 32 of 48 positions shown · IV contrast (6ml gadavist)
Comparison: Previous exams.
COMPARISON: Previous exams.

Addendum:
CLINICAL DATA: 87-year-old female with biopsy proven grade 3
invasive ductal carcinoma involving the upper-outer left breast.
Patient had additional areas of non mass enhancement identified
staging MRI dated 11/15/2018. She presents today for 2 area MRI
guided biopsy of additional suspicious sites.

EXAM:
MRI GUIDED CORE NEEDLE BIOPSY OF THE LEFT BREAST
TECHNIQUE: Multiplanar, multisequence MR imaging of the left breast was
performed both before and after administration of intravenous
contrast.
CONTRAST:  6 mL Gadavist

[Series 2: fiducial unilateral · sagittal · 2.0mm · 1.33mm/px · 3 of 52 slices shown]
[im 1/52]
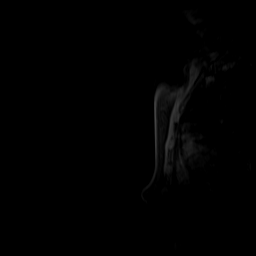
[im 26/52]
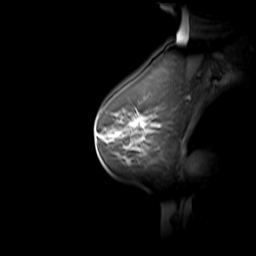
[im 52/52]
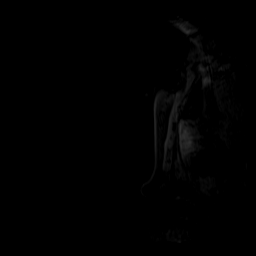

[Series 8: dynamic post 20 · axial · 1.3mm · 0.73mm/px · z∈[-38,+148]mm · 5 of 144 slices shown]
[im 1/144]
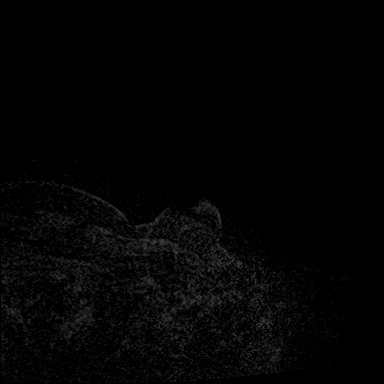
[im 36/144]
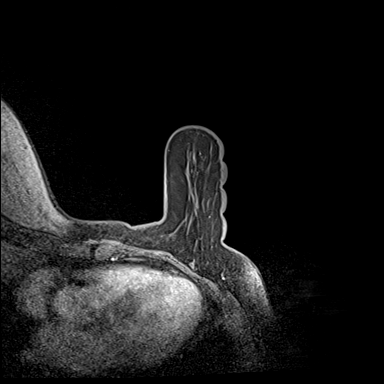
[im 72/144]
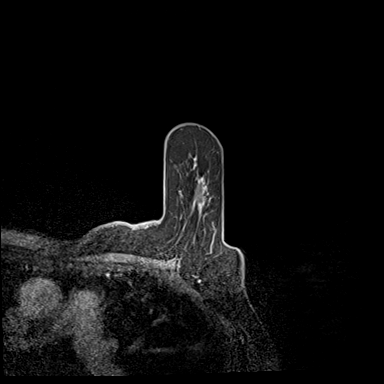
[im 108/144]
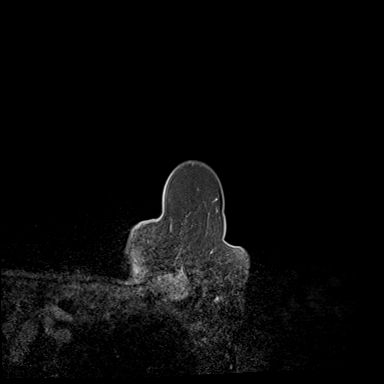
[im 144/144]
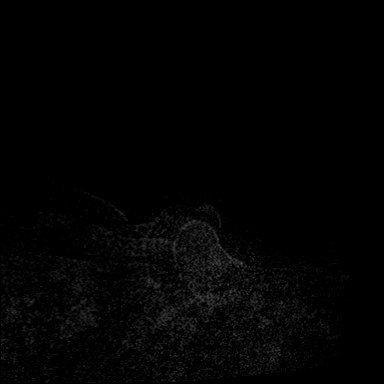

[Series 10: dynamic post 3 · axial · 1.3mm · 0.73mm/px · z∈[-38,+148]mm · 5 of 144 slices shown (1 of 2)]
[im 1/144]
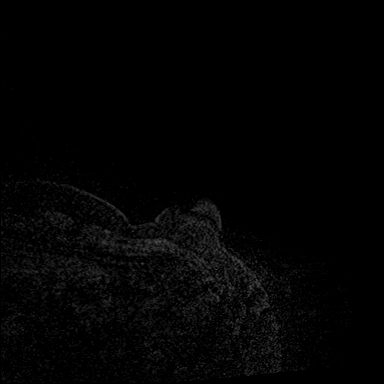
[im 36/144]
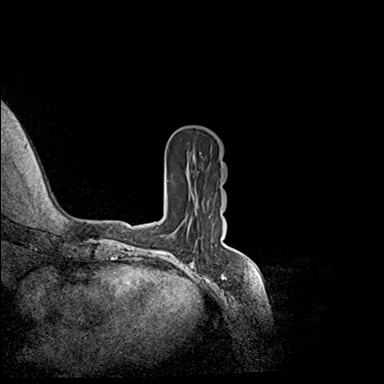
[im 72/144]
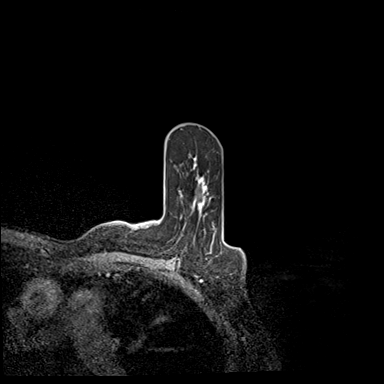
[im 108/144]
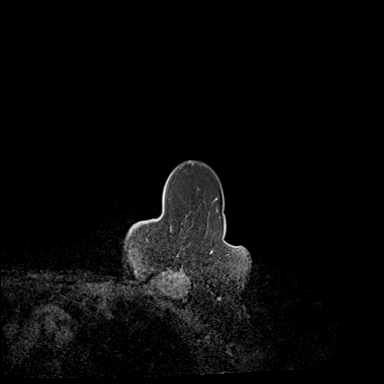
[im 144/144]
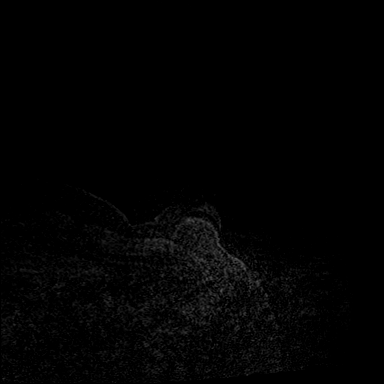

[Series 11: dynamic post 3 · axial · 1.3mm · 0.73mm/px · z∈[-38,+148]mm · 5 of 144 slices shown (2 of 2)]
[im 1/144]
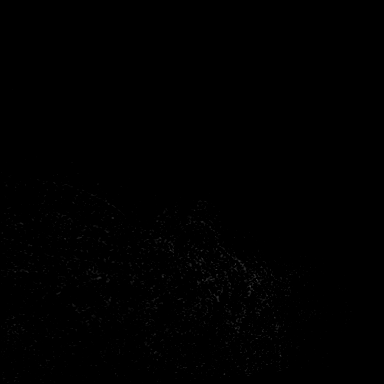
[im 36/144]
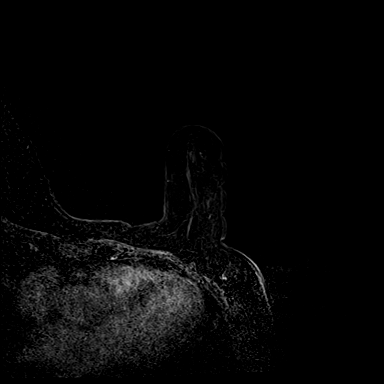
[im 72/144]
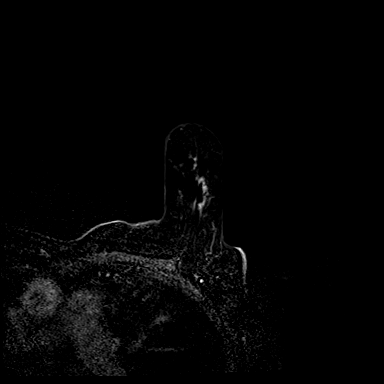
[im 108/144]
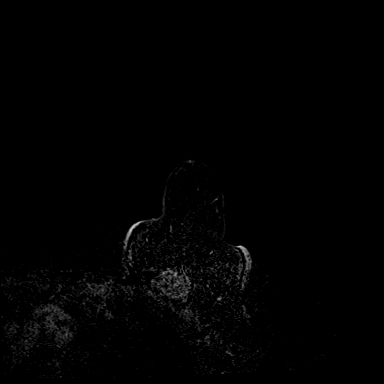
[im 144/144]
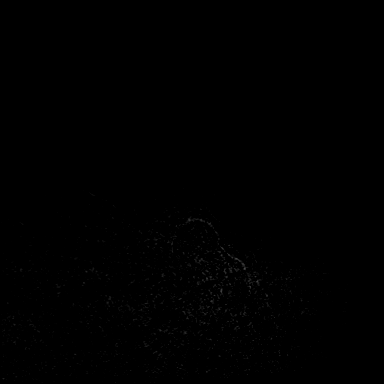

[Series 12: dynamic post 5 · axial · 1.3mm · 0.73mm/px · z∈[-38,+148]mm · 5 of 144 slices shown (1 of 2)]
[im 1/144]
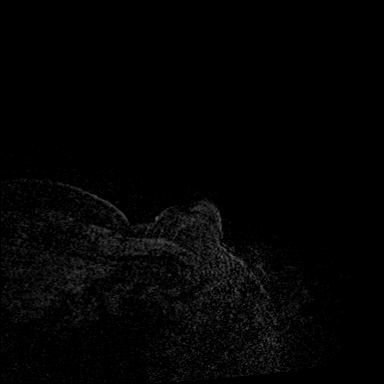
[im 36/144]
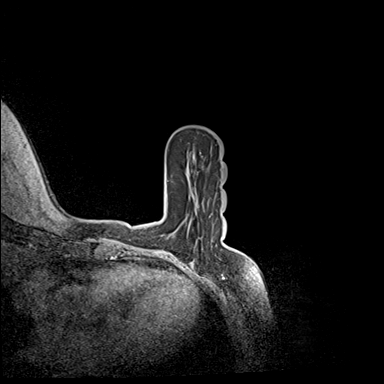
[im 72/144]
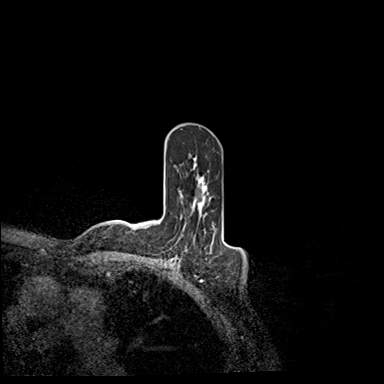
[im 108/144]
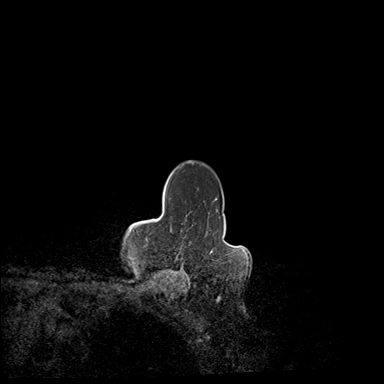
[im 144/144]
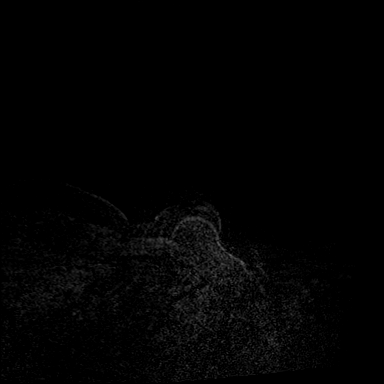

[Series 13: dynamic post 5 · axial · 1.3mm · 0.73mm/px · z∈[-38,+148]mm · 5 of 144 slices shown (2 of 2)]
[im 1/144]
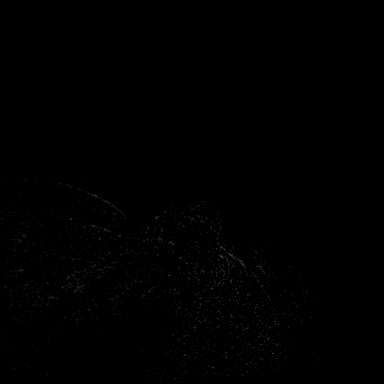
[im 36/144]
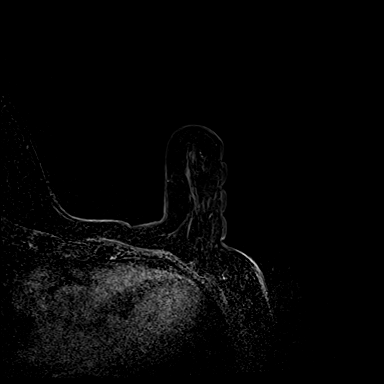
[im 72/144]
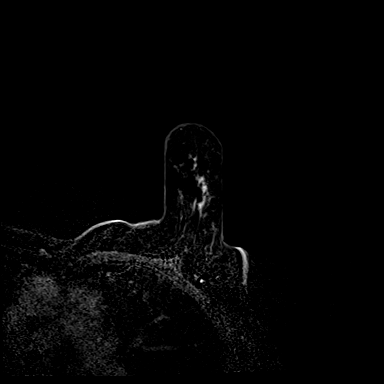
[im 108/144]
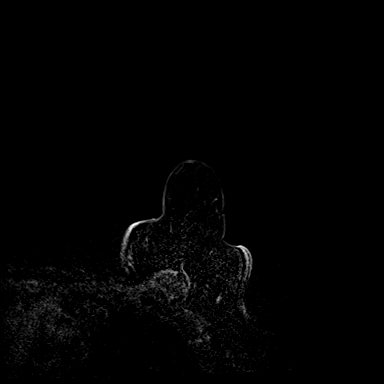
[im 144/144]
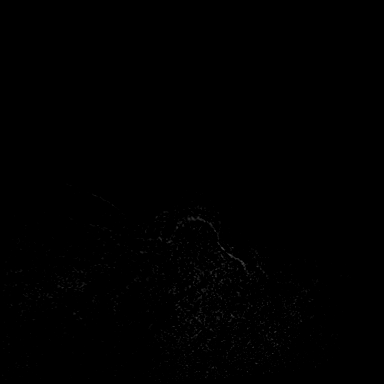

[Series 14: needle confirmation · axial · 1.3mm · 0.73mm/px · z∈[-38,+101]mm · 4 of 144 slices shown]
[im 1/144]
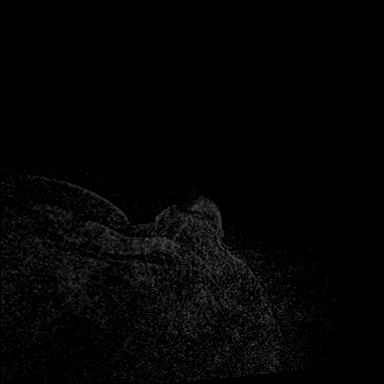
[im 36/144]
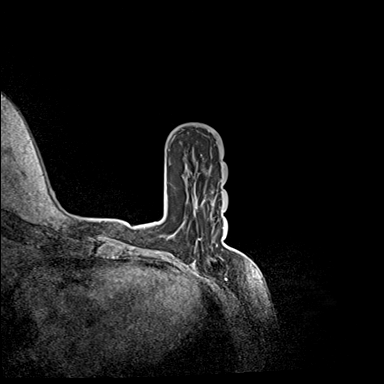
[im 72/144]
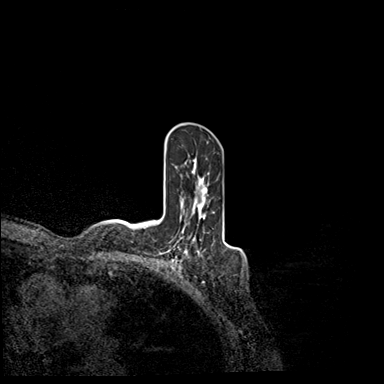
[im 108/144]
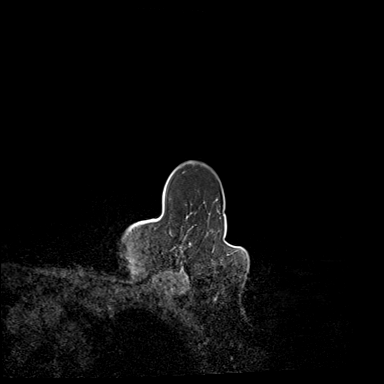

[32 of 48 positions shown; findings below may reference images not displayed]

FINDINGS: I met with the patient, and we discussed the procedure of MRI guided
biopsy, including risks, benefits, and alternatives. Specifically,
we discussed the risks of infection, bleeding, tissue injury, clip
migration, and inadequate sampling. Informed, written consent was
given. The usual time out protocol was performed immediately prior
to the procedure.

Using sterile technique, 1% Lidocaine, MRI guidance, and a 9 gauge
vacuum assisted device, biopsy was performed of non mass enhancement
in the inferior anterior left breast using a lateral approach. At
the conclusion of the procedure, a cylinder shaped tissue marker
clip was deployed into the biopsy cavity.

Using sterile technique, 1% Lidocaine, MRI guidance, and a 9 gauge
vacuum assisted device, biopsy was performed of non mass enhancement
in the central posterior left breast using a lateral approach. At
the conclusion of the procedure, a barbell shaped tissue marker clip
was deployed into the biopsy cavity.

Follow-up 2-view mammogram was performed and dictated separately.
IMPRESSION: MRI guided biopsy of left breast x2.  No apparent complications.

ADDENDUM:
Pathology revealed GRADE III INVASIVE DUCTAL CARCINOMA. DUCTAL
CARCINOMA IN SITU. LYMPHOVASCULAR INVASION IS IDENTIFIED of the LEFT
breast, inferior anterior (cylinder clip). This was found to be
concordant by Dr. Nipa Jablonski.

Pathology revealed INVASIVE DUCTAL CARCINOMA. LYMPHOVASCULAR
INVASION IS IDENTIFIED of the LEFT breast, central posterior
(barbell clip). This was found to be concordant by Dr. Hanghome
Rumpel.

Pathology results were discussed with the patient by telephone. The
patient reported doing well after the biopsy with tenderness at the
site. Post biopsy instructions and care were reviewed and questions
were answered. The patient was encouraged to call The [REDACTED]

The patient has a recent diagnosis of LEFT breast cancer and should
follow her outlined treatment plan.

Pathology results reported by Lingura Feist, RN on 11/30/2018.

*** End of Addendum ***
FINDINGS: I met with the patient, and we discussed the procedure of MRI guided
biopsy, including risks, benefits, and alternatives. Specifically,
we discussed the risks of infection, bleeding, tissue injury, clip
migration, and inadequate sampling. Informed, written consent was
given. The usual time out protocol was performed immediately prior
to the procedure.

Using sterile technique, 1% Lidocaine, MRI guidance, and a 9 gauge
vacuum assisted device, biopsy was performed of non mass enhancement
in the inferior anterior left breast using a lateral approach. At
the conclusion of the procedure, a cylinder shaped tissue marker
clip was deployed into the biopsy cavity.

Using sterile technique, 1% Lidocaine, MRI guidance, and a 9 gauge
vacuum assisted device, biopsy was performed of non mass enhancement
in the central posterior left breast using a lateral approach. At
the conclusion of the procedure, a barbell shaped tissue marker clip
was deployed into the biopsy cavity.

Follow-up 2-view mammogram was performed and dictated separately.
IMPRESSION: MRI guided biopsy of left breast x2.  No apparent complications.

## 2021-04-27 ENCOUNTER — Telehealth: Payer: Self-pay | Admitting: Oncology

## 2021-04-27 DIAGNOSIS — S143XXA Injury of brachial plexus, initial encounter: Secondary | ICD-10-CM | POA: Insufficient documentation

## 2021-04-27 NOTE — Telephone Encounter (Signed)
Scheduled appt per 12/16 referral. Pt's husband is aware of appt date and time.

## 2021-04-28 ENCOUNTER — Telehealth: Payer: Self-pay | Admitting: Oncology

## 2021-04-28 NOTE — Telephone Encounter (Signed)
Butch Penny from [facility] called to get Patient's Appt

## 2021-05-15 ENCOUNTER — Inpatient Hospital Stay: Payer: Medicare Other

## 2021-05-15 ENCOUNTER — Other Ambulatory Visit: Payer: Self-pay | Admitting: Hematology and Oncology

## 2021-05-15 ENCOUNTER — Other Ambulatory Visit: Payer: Self-pay

## 2021-05-15 ENCOUNTER — Encounter: Payer: Self-pay | Admitting: Oncology

## 2021-05-15 ENCOUNTER — Inpatient Hospital Stay: Payer: Medicare Other | Attending: Oncology | Admitting: Oncology

## 2021-05-15 VITALS — BP 185/83 | HR 79 | Temp 98.2°F | Resp 18 | Ht 61.0 in | Wt 123.0 lb

## 2021-05-15 DIAGNOSIS — I890 Lymphedema, not elsewhere classified: Secondary | ICD-10-CM | POA: Insufficient documentation

## 2021-05-15 DIAGNOSIS — Z85038 Personal history of other malignant neoplasm of large intestine: Secondary | ICD-10-CM | POA: Diagnosis not present

## 2021-05-15 DIAGNOSIS — Z5112 Encounter for antineoplastic immunotherapy: Secondary | ICD-10-CM | POA: Insufficient documentation

## 2021-05-15 DIAGNOSIS — C787 Secondary malignant neoplasm of liver and intrahepatic bile duct: Secondary | ICD-10-CM | POA: Diagnosis not present

## 2021-05-15 DIAGNOSIS — Z79899 Other long term (current) drug therapy: Secondary | ICD-10-CM | POA: Insufficient documentation

## 2021-05-15 DIAGNOSIS — C50412 Malignant neoplasm of upper-outer quadrant of left female breast: Secondary | ICD-10-CM | POA: Diagnosis present

## 2021-05-15 DIAGNOSIS — C7951 Secondary malignant neoplasm of bone: Secondary | ICD-10-CM | POA: Diagnosis not present

## 2021-05-15 DIAGNOSIS — Z853 Personal history of malignant neoplasm of breast: Secondary | ICD-10-CM | POA: Insufficient documentation

## 2021-05-15 DIAGNOSIS — I10 Essential (primary) hypertension: Secondary | ICD-10-CM | POA: Diagnosis not present

## 2021-05-15 DIAGNOSIS — Z171 Estrogen receptor negative status [ER-]: Secondary | ICD-10-CM

## 2021-05-15 LAB — CBC AND DIFFERENTIAL
HCT: 43 (ref 36–46)
Hemoglobin: 14.6 (ref 12.0–16.0)
Neutrophils Absolute: 5.98
Platelets: 220 (ref 150–399)
WBC: 8.8

## 2021-05-15 LAB — COMPREHENSIVE METABOLIC PANEL
Albumin: 4.6 (ref 3.5–5.0)
Calcium: 11.3 — AB (ref 8.7–10.7)

## 2021-05-15 LAB — HEPATIC FUNCTION PANEL
ALT: 38 — AB (ref 7–35)
AST: 97 — AB (ref 13–35)
Alkaline Phosphatase: 252 — AB (ref 25–125)
Bilirubin, Total: 0.8

## 2021-05-15 LAB — BASIC METABOLIC PANEL
BUN: 16 (ref 4–21)
CO2: 27 — AB (ref 13–22)
Chloride: 106 (ref 99–108)
Creatinine: 0.7 (ref 0.5–1.1)
Glucose: 107
Potassium: 4.2 (ref 3.4–5.3)
Sodium: 140 (ref 137–147)

## 2021-05-15 LAB — CBC: RBC: 4.6 (ref 3.87–5.11)

## 2021-05-15 NOTE — Progress Notes (Signed)
[facility]  [address] [place],  [place]  [address] [phone]  Clinic Day:  05/15/2021  Referring physician: [name], MD  This document serves as a record of services personally performed by [name], MD. It was created on their behalf by [name],[name], a trained medical scribe. The creation of this record is based on the scribe's personal observations and the provider's statements to them.  ASSESSMENT & PLAN:   Stage IIB (T2c N1 M0) HER2 receptor positive left breast cancer, diagnosed in June 2020. This was ER/PR negative. This was treated with neoadjuvant Kadcyla/Perjeta and left mastectomy, with a complete response in breast and nodes. She had only received two doses of adjuvant Herceptin before being discontinued in December 2020.  History of stage II colon cancer, diagnosed in May 2007. This was treated with surgical resection and adjuvant chemotherapy with Xeloda. Colonoscopy from 2013 revealed 2 polyps which were resected and chronic diverticulosis.  Numerous heterogeneously enhancing, internally necrotic lesions in the right hepatic lobe, highly concerning for metastatic disease. Largest lesion measures up to 6.7 cm in hepatic segment VI.   Enhancing osseous lesions in the L2 vertebral body, both iliac bones, and in the sacrum, highly concerning for osseous metastases. She has not had further workup of the spine so we will schedule her for MRI lumbar spine.   Left supraclavicular lymph node measuring 2-3 cm. This would be a good option for biopsy for pathologic confirmation.   Lymphedema of the left upper extremity. She has a sleeve in place, but this is causing severe irritation. She will plan to follow up with Amy the lymphedema specialist.   Hypercalcemia, mild.   This is a pleasant 88 year old female with a history of stage IIB HER2 receptor positive left breast cancer, diagnosed in June 2020. She also has a history of  stage II colon cancer, diagnosed in May 2007. She presents as a transfer of care. Unfortunately, recent MRI imaging of the abdomen has revealed numerous heterogeneously enhancing, internally necrotic lesions in the right hepatic lobe, highly concerning for metastatic disease. Largest lesion measures up to 6.7 cm in hepatic segment VI. Enhancing osseous lesions in the L2 vertebral body, both iliac bones, and in the sacrum, highly concerning for osseous metastases was also seen. We will schedule her for PET imaging as well as MRI brain to rule out other evidence of metastatic disease.  As she has not had further workup of the spine, we will also schedule MRI of the lumbar spine. I do recommend biopsy of the left supraclavicular node for pathologic confirmation, and we will refer her to Dr. Lilia Pro. In view of her pain, I will send in tramadol 50 mg Q6H prn, #30. Treatment options were briefly reviewed today and I would recommend port placement. She was agreeable to having a new baseline ECHO done as it has been 2-3 years. She understands and agrees with this plan of care. I have answered her questions and she knows to call with any concerns.  Thank you for the opportunity to participate in the care of your patients  I provided 50 minutes of face-to-face time during this this encounter and > 50% was spent counseling as documented under my assessment and plan.    [name], MD [facility] [address] [place] [address] Dept: [phone] Dept Fax: [phone]   CHIEF COMPLAINT:  CC: Stage IIB HER2 receptor positive  left breast cancer with evidence of metastatic disease  Current Treatment:  Diagnostics    HISTORY OF PRESENT ILLNESS:  Jasmin Lewis is a 88 y.o. female who presents for a transfer of care for there continued evaluation and management of stage IIB left breast cancer. She was originally diagnosed in June  2020 when she was able to palpate a mass of the left breast. Mammogram and ultrasound from May 2020 revealed a 2.9 cm irregular mass in the upper outer left breast as well as two adjacent abnormal left axillary lymph nodes. Biopsy was obtained on June 3rd and revealed invasive ductal carcinoma and DCIS with lymphovascular invasion. Left axillary lymph node was positive for invasive ductal carcinoma. HER2 was positive 3+. Estrogen and progesterone receptors were both negative at 0%, and Ki67 was 70%. Breast MRI from July revealed a 3.3 cm mass of the lateral portion of the left breast as well as significant non mass enhancement surrounding this mass and extending anteriorly into the nipple base, with largest diameter in the anterior to posterior axis, measuring 7.2 centimeters. Three enlarged left axillary lymph nodes. Right breast was negative.  PET imaging from July revealed no evidence of additional metastatic involvement. She received neoadjuvant Kadcyla/Perjeta for 6 cycles. Repeat breast MRI from October revealed complete resolution of the enhancing mass and associated non mass enhancement within the left breast. Previously identified left axillary lymphadenopathy was no longer identified. She underwent left mastectomy and targeted lymph node dissection in November 2020 and final pathology from this procedure confirmed no evidence of malignancy, for a complete response. Three sentinel lymph nodes were negative for malignancy (0/3). She continued maintenance Herceptin injections for two more doses, but this was discontinued in December 2020 as she refused further ECHO scans.   Of note, she also has a history of stage II colon cancer, diagnosed in May 2007. Preop CEA was 5.2. She underwent surgical resection and surgical pathology revealed a 5.9 x 3.8 x 1.2 cm moderate to well-differentiated adenocarcinoma penetrating the muscular wall. Forty lymph nodes negative. No vascular or lymphatic invasion. Multiple  diverticula with microabscess formation and inflammation. Carcinoma present in at least 1 diverticulum. She received adjuvant chemotherapy with Xeloda for 9 months. Colonoscopy from August 2013 revealed 2 polyps which were resected and chronic diverticulosis.  INTERVAL HISTORY:  I have reviewed her chart and materials related to her cancer extensively and collaborated history with the patient. Summary of oncologic history is as follows: Oncology History Overview Note  Cancer Staging Malignant neoplasm of upper-outer quadrant of left breast in female, estrogen receptor negative ([place]) Staging form: Breast, AJCC 8th Edition - Clinical stage from 10/11/2018: Stage IIB (cT2, cN1, cM0, G3, ER-, PR-, HER2+) - Signed by Truitt Merle, MD on 11/02/2018 - Clinical: No stage assigned - Unsigned    [name], stage II  09/2005 Initial Diagnosis   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 Findings were a 5.9 x 3.8 x 1.2 cm moderate to well-differentiated adenocarcinoma penetrating the muscular wall. Forty lymph nodes negative. No vascular or lymphatic invasion. Multiple diverticula with microabscess formation and inflammation. Carcinoma present in at least 1 diverticulum. Preop CEA 5.2 with lab normal range 0 to 2.5. Preop CT scan with no obvious additional pathology.    2007 -  Chemotherapy   She received oral Xeloda chemotherapy as an adjuvant. She declined treatment on a clinical trial.  11/12/2011 Initial Diagnosis   [name], stage II   12/09/2011 Procedure   Followup colonoscopy done on 12/09/2011. She was found to have 2 polyps which were removed. Chronic diverticulosis.     Malignant neoplasm of upper-outer quadrant of left breast in female, estrogen receptor negative ([place])  09/26/2018 Mammogram   Mammogram/US of left breast 09/26/18 IMPRESSION:  1. 2.9cm irregular mass in the upper outer  left breast corresponds to the palpable abnormality. This is highly suspicious for breast carcinoma.  2. Two adjacent abnormal left axillary  Lymph nodes suspicious for metastatic adenopathy. There is a third borderline abnormal left axillary LN with a cortex thickened to 87m.  3. Benign right breast cyst. No evidence of right breast malignancy.    10/11/2018 Cancer Staging   Staging form: Breast, AJCC 8th Edition - Clinical stage from 10/11/2018: Stage IIB (cT2, cN1, cM0, G3, ER-, PR-, HER2+) - Signed by [name] MD on 11/02/2018    10/11/2018 Initial Biopsy   Diagnosis 10/11/18 1. Breast, left, needle core biopsy, upper outer left 2 o'clock - INVASIVE DUCTAL CARCINOMA. - DUCTAL CARCINOMA IN SITU. - LYMPHOVASCULAR INVASION IS IDENTIFIED. - SEE COMMENT. 2. Lymph node, needle/core biopsy, left axilla - INVASIVE DUCTAL CARCINOMA. - SEE COMMENT.   10/11/2018 Receptors her2   The tumor cells are POSITIVE for Her2 (3+). Estrogen Receptor: 0%, NEGATIVE Progesterone Receptor: 0%, NEGATIVE Proliferation Marker Ki67: 70%   11/02/2018 Initial Diagnosis   Malignant neoplasm of upper-outer quadrant of left breast in female, estrogen receptor negative [place]   11/15/2018 Breast MRI   MRI breast 11/15/18  IMPRESSION: 1. 3.3 centimeter mass in the LATERAL portion of the LEFT breast consistent with known malignancy. 2. There is significant non mass enhancement surrounding this mass and extending anteriorly into the nipple base, with largest diameter in the anterior to posterior axis, measuring 7.2 centimeters. 3. If the patient would consider breast conservation, additional MR guided core biopsies are recommended. Consider biopsy of the inferior and anterior extent of the non mass enhancement to document extent of disease. 4. Three enlarged LEFT axillary lymph nodes. 5. RIGHT breast is negative.   11/15/2018 PET scan   PET 11/15/18 IMPRESSION: Hypermetabolic left breast lesion compatible with known  primary. Hypermetabolic left axillary lymph nodes are consistent with metastatic disease.   No evidence for additional hypermetabolic metastatic involvement in the neck, chest, abdomen, or pelvis.   11/17/2018 - 03/01/2019 Chemotherapy   Neo-adjuvant Kadcyla and perjeta q3weeks for 6 cycles starting 11/17/18. Stopped before surgery    11/29/2018 Pathology Results   Diagnosis 1. Breast, left, needle core biopsy, inferior anterior (cylinder clip) - INVASIVE DUCTAL CARCINOMA. - DUCTAL CARCINOMA IN SITU. - LYMPHOVASCULAR INVASION IS IDENTIFIED. - SEE COMMENT. 2. Breast, left, needle core biopsy, central posterior (barbell clip) - INVASIVE DUCTAL CARCINOMA. - LYMPHOVASCULAR INVASION IS IDENTIFIED.   02/12/2019 Breast MRI   IMPRESSION: 1. Complete resolution of previously identified enhancing mass and associated non mass enhancement within the left breast. This is consistent with excellent response to chemotherapy. No residual or suspicious findings are identified. 2. No MRI evidence of malignancy on the right. 3. Previously identified left axillary lymphadenopathy not definitively seen on today's study. However, this may be due to decreased field-of-view compared to prior study.     03/14/2019 Cancer Staging   Staging form: Breast, AJCC 8th Edition - Pathologic stage from 03/14/2019: pT0, pN0, cM0, GX, ER: Unknown, PR: Unknown, HER2: Not Assessed - Signed by [name] MD on 03/28/2019  03/14/2019 Surgery   LEFT MASTECTOMY WITH TARGETED LYMPH NODE DISSECTION and Left Axillary Sentinel Lymph  Node Biopsy by Dr [name] 03/14/19    03/14/2019 Pathology Results   FINAL MICROSCOPIC DIAGNOSIS:   A. LYMPH NODE, LEFT, SENTINEL, BIOPSY:  - There is no evidence of carcinoma in 1 of 1 lymph node (0/1).   B. BREAST, LEFT, MASTECTOMY:  - Benign breast parenchyma with treatment-related changes.  - There is no evidence of malignancy.  - See oncology table below.   C. LYMPH NODE, LEFT #1,  SENTINEL, BIOPSY:  - There is no evidence of carcinoma in 1 of 1 lymph node (0/1).   D. LYMPH NODE, LEFT #2, SENTINEL, BIOPSY:  - There is no evidence of carcinoma in 1 of 1 lymph node (0/1).     04/04/2019 - 04/25/2019 Chemotherapy   Maintenance Herceptin injections every 3 weeks starting 04/03/19 to complete 1 year of treatment that was started in 11/2018. Stopped after 2nd dose as she will not be repeating Echos.      [place] states that she presented to [facility] ER on December 9th due to constipation and back pain with spasms. CT abdomen/pelvis from December 9th revealed new lytic lesion involving the L2 vertebral body with minimal cortical breakthrough superiorly, but with preservation of vertebral body height, concerning for metastatic disease. There are several indeterminate hepatic lesions. In addition to the lesions, there is an additional subtle 1.5 cm hypodensity in the hepatic dome. MRI abdomen from January 3rd revealed numerous heterogeneously enhancing, internally necrotic lesions in the right hepatic lobe, highly concerning for metastatic disease. Largest lesion measures up to 6.7 cm in hepatic segment VI. Enhancing osseous lesions in the L2 vertebral body, both iliac bones, and in the sacrum, highly concerning for osseous metastases. She has not had further workup of the spine. She continues to have intermittent back spasms which she is treating with occasional Tylenol. She also is prone to constipation and so she has been using milk of magnesia every night. Her stools are watery in the mornings. She has chronic lymphedema of the left upper extremity since November 2022 after an accident, for which she wears a compression sleeve. However, her skin is reacting to the plastic cuff of the sleeve, even though she has tried turning it inside out. She is limited in her mobility of the right upper extremity due to prior incident of dislocated shoulder. Blood counts and chemistries are unremarkable  except for elevated liver transaminases and an alkaline phosphatase of 252. Her  appetite is good, but she reports early satiety, and has lost around 7 pounds. She denies fever, chills or other signs of infection.  She denies nausea, vomiting, bowel issues, or abdominal pain.  She denies sore throat, cough, dyspnea, or chest pain.  HISTORY:   Past Medical History:  Diagnosis Date   Arthritis    shoulder   Breast cancer ([place])    Cancer ([place])    left breast ca   Colon cancer ([place])    [name], stage II 11/12/2011   Sigmoid lesion 5.9 cm  40 nodes negative but focus of cancer in a diverticum   Pre-op CEA 5.2 with lab normal up to 2.5 resected 09/21/05   Xeloda adjuvant chemotherapy   Hypertension     Past Surgical History:  Procedure Laterality Date   COLONOSCOPY     HEMICOLECTOMY  2007   MASTECTOMY WITH AXILLARY LYMPH NODE DISSECTION Left 03/14/2019   Procedure: LEFT MASTECTOMY WITH TARGETED LYMPH NODE  DISSECTION;  Surgeon: Jovita Kussmaul, MD;  Location: [facility];  Service: General;  Laterality: Left;   SENTINEL NODE BIOPSY Left 03/14/2019   Procedure: Left Axillary Sentinel Lymph  Node Biopsy;  Surgeon: Jovita Kussmaul, MD;  Location: [facility];  Service: General;  Laterality: Left;   TONSILLECTOMY     WISDOM TOOTH EXTRACTION      Family History  Problem Relation Age of Onset   Cancer Maternal Aunt        colon cancer     Social History:  reports that she has never smoked. She has never used smokeless tobacco. She reports that she does not drink alcohol and does not use drugs.The patient is alone today. She is married and lives at home with her spouse and son. She has 3 children. She is retired from work, and has never been exposed to chemicals or other toxic agents.   Allergies: No Known Allergies  Current Medications: Current Outpatient Medications  Medication Sig Dispense Refill   calcium citrate-vitamin D (CITRACAL+D) 315-200 MG-UNIT per tablet Take 1 tablet by mouth daily.      fish oil-omega-3 fatty acids 1000 MG capsule Take 1 g by mouth daily.     ondansetron (ZOFRAN) 8 MG tablet Take 1 tablet (8 mg total) by mouth every 8 (eight) hours as needed for nausea or vomiting. (Patient not taking: Reported on 03/07/2019) 20 tablet 2   No current facility-administered medications for this visit.    REVIEW OF SYSTEMS:  Review of Systems  Constitutional: Negative.  Negative for appetite change, chills, fatigue, fever and unexpected weight change.  HENT:  Negative.    Eyes: Negative.   Respiratory: Negative.  Negative for chest tightness, cough, hemoptysis, shortness of breath and wheezing.   Cardiovascular: Negative.  Negative for chest pain, leg swelling and palpitations.  Gastrointestinal:  Positive for constipation (for which she uses milk of magnesia every night). Negative for abdominal distention, abdominal pain, blood in stool, diarrhea, nausea and vomiting.  Endocrine: Negative.   Genitourinary: Negative.  Negative for difficulty urinating, dysuria, frequency and hematuria.   Musculoskeletal:  Positive for back pain (intemittent back spasms). Negative for arthralgias, flank pain, gait problem and myalgias.       Lymphedema of the left upper extremity; limited range of motion of the right upper extremity from prior injury  Skin: Negative.   Neurological: Negative.  Negative for dizziness, extremity weakness, gait problem, headaches, light-headedness, numbness, seizures and speech difficulty.  Hematological: Negative.   Psychiatric/Behavioral: Negative.  Negative for depression and sleep disturbance. The patient is not nervous/anxious.      VITALS:  Blood pressure (!) 185/83, pulse 79, temperature 98.2 F (36.8 C), temperature source Oral, resp. rate 18, height _0  (1.549 m), weight 123 lb (55.8 kg), SpO2 96 %.  Wt Readings from Last 3 Encounters:  05/15/21 123 lb (55.8 kg)  04/25/19 132 lb 12.8 oz (60.2 kg)  04/03/19 128 lb 12 oz (58.4 kg)    Body mass  index is 23.24 kg/m.  Performance status (ECOG): 1 - Symptomatic but completely ambulatory  PHYSICAL EXAM:  Physical Exam Constitutional:      General: She is not in acute distress.    Appearance: Normal appearance. She is normal weight.  HENT:     Head: Normocephalic and atraumatic.  Eyes:     General: No scleral icterus.    Extraocular Movements: Extraocular movements intact.     Conjunctiva/sclera: Conjunctivae normal.     Pupils: Pupils are  equal, round, and reactive to light.  Cardiovascular:     Rate and Rhythm: Normal rate and regular rhythm.     Pulses: Normal pulses.     Heart sounds: Normal heart sounds. No murmur heard.   No friction rub. No gallop.  Pulmonary:     Effort: Pulmonary effort is normal. No respiratory distress.     Breath sounds: Normal breath sounds.  Chest:     Comments: Left mastectomy is negative. She does have some mild chest wall edema laterally. Right breast is without masses. Abdominal:     General: Bowel sounds are normal. There is no distension.     Palpations: Abdomen is soft. There is no hepatomegaly, splenomegaly or mass.     Tenderness: There is no abdominal tenderness.  Musculoskeletal:        General: Normal range of motion.     Cervical back: Normal range of motion and neck supple.     Right lower leg: No edema.     Left lower leg: No edema.     Comments: She has severe erythema at the upper cuff of the lymphedema sleeve. The area is warm. She has 1+ lymphedema of the left upper extremity.  Lymphadenopathy:     Cervical: No cervical adenopathy.     Upper Body:     Left upper body: Supraclavicular adenopathy (hard nodule measuring 2-3 cm in length with additional small lymph nodes adjacent to it) present.  Skin:    General: Skin is warm and dry.  Neurological:     General: No focal deficit present.     Mental Status: She is alert and oriented to person, place, and time. Mental status is at baseline.  Psychiatric:        Mood and  Affect: Mood normal.        Behavior: Behavior normal.        Thought Content: Thought content normal.        Judgment: Judgment normal.     LABS:   CBC Latest Ref Rng & Units 05/15/2021 04/25/2019 03/01/2019  WBC - 8.8 5.8 5.6  Hemoglobin 12.0 - 16.0 14.6 13.7 13.8  Hematocrit 36 - 46 43 41.8 42.8  Platelets 150 - 399 220 150 144(L)   CMP Latest Ref Rng & Units 05/15/2021 04/25/2019 03/01/2019  Glucose 70 - 99 mg/dL - 104(H) 107(H)  BUN 4 - _0 Creatinine 0.5 - 1.1 0.7 0.90 0.79  Sodium 137 - 147 140 141 139  Potassium 3.4 - 5.3 4.2 4.3 3.9  Chloride 99 - 108 106 105 106  CO2 13 - 22 27(A) 27 24  Calcium 8.7 - 10.7 11.3(A) 9.4 9.8  Total Protein 6.5 - 8.1 g/dL - 7.4 7.7  Total Bilirubin 0.3 - 1.2 mg/dL - 0.5 0.4  Alkaline Phos 25 - 125 252(A) 90 90  AST 13 - 35 97(A) 41 52(H)  ALT 7 - 35 38(A) 29 38     No results found for: CEA1 / No results found for: CEA1  Lab Results  Component Value Date   LDH 186 11/12/2011   LDH 187 09/08/2010   LDH 171 09/09/2009    STUDIES:  No results found.    I, Rita Ohara, am acting as scribe for [name], MD  I have reviewed this report as typed by the medical scribe, and it is complete and accurate.

## 2021-05-16 LAB — CANCER ANTIGEN 27.29: CA 27.29: 13.6 U/mL (ref 0.0–38.6)

## 2021-05-18 ENCOUNTER — Other Ambulatory Visit: Payer: Self-pay | Admitting: Oncology

## 2021-05-18 ENCOUNTER — Other Ambulatory Visit: Payer: Self-pay

## 2021-05-18 ENCOUNTER — Encounter: Payer: Self-pay | Admitting: Hematology

## 2021-05-18 ENCOUNTER — Telehealth: Payer: Self-pay

## 2021-05-18 ENCOUNTER — Encounter: Payer: Self-pay | Admitting: Oncology

## 2021-05-18 DIAGNOSIS — Z171 Estrogen receptor negative status [ER-]: Secondary | ICD-10-CM

## 2021-05-18 DIAGNOSIS — I5189 Other ill-defined heart diseases: Secondary | ICD-10-CM

## 2021-05-18 DIAGNOSIS — C50412 Malignant neoplasm of upper-outer quadrant of left female breast: Secondary | ICD-10-CM

## 2021-05-18 DIAGNOSIS — C7951 Secondary malignant neoplasm of bone: Secondary | ICD-10-CM

## 2021-05-18 DIAGNOSIS — C787 Secondary malignant neoplasm of liver and intrahepatic bile duct: Secondary | ICD-10-CM

## 2021-05-18 DIAGNOSIS — Z85038 Personal history of other malignant neoplasm of large intestine: Secondary | ICD-10-CM

## 2021-05-18 NOTE — Telephone Encounter (Signed)
Referral sent to Dr. Maxie Barb office.

## 2021-05-18 NOTE — Telephone Encounter (Signed)
-----   Message from [name], MD sent at 05/18/2021  7:13 AM EST ----- Regarding: referral Pls ref to surgeon for bx of left supraclavicular node & port placement

## 2021-05-20 ENCOUNTER — Encounter: Payer: Self-pay | Admitting: Hematology

## 2021-05-25 ENCOUNTER — Inpatient Hospital Stay: Payer: Medicare Other

## 2021-05-25 ENCOUNTER — Telehealth: Payer: Self-pay

## 2021-05-25 ENCOUNTER — Other Ambulatory Visit: Payer: Self-pay | Admitting: Oncology

## 2021-05-25 ENCOUNTER — Other Ambulatory Visit: Payer: Self-pay | Admitting: Pharmacist

## 2021-05-25 ENCOUNTER — Other Ambulatory Visit: Payer: Self-pay

## 2021-05-25 ENCOUNTER — Other Ambulatory Visit: Payer: Self-pay | Admitting: Hematology and Oncology

## 2021-05-25 ENCOUNTER — Inpatient Hospital Stay (INDEPENDENT_AMBULATORY_CARE_PROVIDER_SITE_OTHER): Payer: Medicare Other | Admitting: Oncology

## 2021-05-25 ENCOUNTER — Encounter: Payer: Self-pay | Admitting: Oncology

## 2021-05-25 VITALS — BP 196/88 | HR 93 | Temp 97.8°F | Resp 18 | Ht 61.0 in | Wt 119.7 lb

## 2021-05-25 DIAGNOSIS — Z5112 Encounter for antineoplastic immunotherapy: Secondary | ICD-10-CM | POA: Diagnosis not present

## 2021-05-25 DIAGNOSIS — C50412 Malignant neoplasm of upper-outer quadrant of left female breast: Secondary | ICD-10-CM

## 2021-05-25 DIAGNOSIS — E860 Dehydration: Secondary | ICD-10-CM | POA: Diagnosis not present

## 2021-05-25 DIAGNOSIS — C7951 Secondary malignant neoplasm of bone: Secondary | ICD-10-CM

## 2021-05-25 DIAGNOSIS — C787 Secondary malignant neoplasm of liver and intrahepatic bile duct: Secondary | ICD-10-CM

## 2021-05-25 DIAGNOSIS — Z171 Estrogen receptor negative status [ER-]: Secondary | ICD-10-CM

## 2021-05-25 LAB — HEPATIC FUNCTION PANEL
ALT: 43 — AB (ref 7–35)
AST: 137 — AB (ref 13–35)
Alkaline Phosphatase: 269 — AB (ref 25–125)
Bilirubin, Total: 0.8

## 2021-05-25 LAB — COMPREHENSIVE METABOLIC PANEL
Albumin: 4.4 (ref 3.5–5.0)
Calcium: 12.6 — AB (ref 8.7–10.7)

## 2021-05-25 LAB — CBC AND DIFFERENTIAL
HCT: 44 (ref 36–46)
Hemoglobin: 14.4 (ref 12.0–16.0)
Neutrophils Absolute: 5.7
Platelets: 197 (ref 150–399)
WBC: 8.5

## 2021-05-25 LAB — BASIC METABOLIC PANEL
BUN: 18 (ref 4–21)
CO2: 32 — AB (ref 13–22)
Chloride: 104 (ref 99–108)
Creatinine: 0.9 (ref 0.5–1.1)
Glucose: 97
Potassium: 3.9 (ref 3.4–5.3)
Sodium: 142 (ref 137–147)

## 2021-05-25 LAB — CBC: RBC: 4.61 (ref 3.87–5.11)

## 2021-05-25 MED ORDER — HEPARIN SOD (PORK) LOCK FLUSH 100 UNIT/ML IV SOLN
500.0000 [IU] | Freq: Once | INTRAVENOUS | Status: AC | PRN
  Administered 2021-05-25: 500 [IU]

## 2021-05-25 MED ORDER — SODIUM CHLORIDE 0.9% FLUSH
10.0000 mL | Freq: Once | INTRAVENOUS | Status: AC | PRN
  Administered 2021-05-25: 10 mL

## 2021-05-25 MED ORDER — ZOLEDRONIC ACID 4 MG/100ML IV SOLN
4.0000 mg | Freq: Once | INTRAVENOUS | Status: AC
  Administered 2021-05-25: 4 mg via INTRAVENOUS

## 2021-05-25 MED ORDER — SODIUM CHLORIDE 0.9 % IV SOLN: Freq: Once | INTRAVENOUS | Status: AC

## 2021-05-25 NOTE — Patient Instructions (Signed)
Dehydration, Adult Dehydration is condition in which there is not enough water or other fluids in the body. This happens when a person loses more fluids than he or she takes in. Important body parts cannot work right without the right amount of fluids. Any loss of fluids from the body can cause dehydration. Dehydration can be mild, worse, or very bad. It should be treated right away to keep it from getting very bad. What are the causes? This condition may be caused by: Conditions that cause loss of water or other fluids, such as: Watery poop (diarrhea). Vomiting. Sweating a lot. Peeing (urinating) a lot. Not drinking enough fluids, especially when you: Are ill. Are doing things that take a lot of energy to do. Other illnesses and conditions, such as fever or infection. Certain medicines, such as medicines that take extra fluid out of the body (diuretics). Lack of safe drinking water. Not being able to get enough water and food. What increases the risk? The following factors may make you more likely to develop this condition: Having a long-term (chronic) illness that has not been treated the right way, such as: Diabetes. Heart disease. Kidney disease. Being 38 years of age or older. Having a disability. Living in a place that is high above the ground or sea (high in altitude). The thinner, dried air causes more fluid loss. Doing exercises that put stress on your body for a long time. What are the signs or symptoms? Symptoms of dehydration depend on how bad it is. Mild or worse dehydration Thirst. Dry lips or dry mouth. Feeling dizzy or light-headed, especially when you stand up from sitting. Muscle cramps. Your body making: Dark pee (urine). Pee may be the color of tea. Less pee than normal. Less tears than normal. Headache. Very bad dehydration Changes in skin. Skin may: Be cold to the touch (clammy). Be blotchy or pale. Not go back to normal right after you lightly pinch  it and let it go. Little or no tears, pee, or sweat. Changes in vital signs, such as: Fast breathing. Low blood pressure. Weak pulse. Pulse that is more than 100 beats a minute when you are sitting still. Other changes, such as: Feeling very thirsty. Eyes that look hollow (sunken). Cold hands and feet. Being mixed up (confused). Being very tired (lethargic) or having trouble waking from sleep. Short-term weight loss. Loss of consciousness. How is this treated? Treatment for this condition depends on how bad it is. Treatment should start right away. Do not wait until your condition gets very bad. Very bad dehydration is an emergency. You will need to go to a hospital. Mild or worse dehydration can be treated at home. You may be asked to: Drink more fluids. Drink an oral rehydration solution (ORS). This drink helps get the right amounts of fluids and salts and minerals in the blood (electrolytes). Very bad dehydration can be treated: With fluids through an IV tube. By getting normal levels of salts and minerals in your blood. This is often done by giving salts and minerals through a tube. The tube is passed through your nose and into your stomach. By treating the root cause. Follow these instructions at home: Oral rehydration solution If told by your doctor, drink an ORS: Make an ORS. Use instructions on the package. Start by drinking small amounts, about  cup (120 mL) every 5-10 minutes. Slowly drink more until you have had the amount that your doctor said to have. Eating and drinking  Drink enough clear fluid to keep your pee pale yellow. If you were told to drink an ORS, finish the ORS first. Then, start slowly drinking other clear fluids. Drink fluids such as: Water. Do not drink only water. Doing that can make the salt (sodium) level in your body get too low. Water from ice chips you suck on. Fruit juice that you have added water to (diluted). Low-calorie sports  drinks. Eat foods that have the right amounts of salts and minerals, such as: Bananas. Oranges. Potatoes. Tomatoes. Spinach. Do not drink alcohol. Avoid: Drinks that have a lot of sugar. These include: High-calorie sports drinks. Fruit juice that you did not add water to. Soda. Caffeine. Foods that are greasy or have a lot of fat or sugar. General instructions Take over-the-counter and prescription medicines only as told by your doctor. Do not take salt tablets. Doing that can make the salt level in your body get too high. Return to your normal activities as told by your doctor. Ask your doctor what activities are safe for you. Keep all follow-up visits as told by your doctor. This is important. Contact a doctor if: You have pain in your belly (abdomen) and the pain: Gets worse. Stays in one place. You have a rash. You have a stiff neck. You get angry or annoyed (irritable) more easily than normal. You are more tired or have a harder time waking than normal. You feel: Weak or dizzy. Very thirsty. Get help right away if you have: Any symptoms of very bad dehydration. Symptoms of vomiting, such as: You cannot eat or drink without vomiting. Your vomiting gets worse or does not go away. Your vomit has blood or green stuff in it. Symptoms that get worse with treatment. A fever. A very bad headache. Problems with peeing or pooping (having a bowel movement), such as: Watery poop that gets worse or does not go away. Blood in your poop (stool). This may cause poop to look black and tarry. Not peeing in 6-8 hours. Peeing only a small amount of very dark pee in 6-8 hours. Trouble breathing. These symptoms may be an emergency. Do not wait to see if the symptoms will go away. Get medical help right away. Call your local emergency services (911 in the [place] Do not drive yourself to the hospital. Summary Dehydration is a condition in which there is not enough water or other fluids  in the body. This happens when a person loses more fluids than he or she takes in. Treatment for this condition depends on how bad it is. Treatment should be started right away. Do not wait until your condition gets very bad. Drink enough clear fluid to keep your pee pale yellow. If you were told to drink an oral rehydration solution (ORS), finish the ORS first. Then, start slowly drinking other clear fluids. Take over-the-counter and prescription medicines only as told by your doctor. Get help right away if you have any symptoms of very bad dehydration. This information is not intended to replace advice given to you by your health care provider. Make sure you discuss any questions you have with your health care provider. Document Revised: 12/07/2018 Document Reviewed: 12/07/2018 [employer] Patient Education  [employer]. Zoledronic Acid Injection (Hypercalcemia, Oncology) What is this medication? ZOLEDRONIC ACID (ZOE le dron ik AS id) slows calcium loss from bones. It high calcium levels in the blood from some kinds of cancer. It may be used in other people at risk for bone loss.  This medicine may be used for other purposes; ask your health care provider or pharmacist if you have questions. COMMON BRAND NAME(S): Zometa What should I tell my care team before I take this medication? They need to know if you have any of these conditions: cancer dehydration dental disease kidney disease liver disease low levels of calcium in the blood lung or breathing disease (asthma) receiving steroids like dexamethasone or prednisone an unusual or allergic reaction to zoledronic acid, other medicines, foods, dyes, or preservatives pregnant or trying to get pregnant breast-feeding How should I use this medication? This drug is injected into a vein. It is given by a health care provider in a hospital or clinic setting. Talk to your health care provider about the use of this drug in children. Special  care may be needed. Overdosage: If you think you have taken too much of this medicine contact a poison control center or emergency room at once. NOTE: This medicine is only for you. Do not share this medicine with others. What if I miss a dose? Keep appointments for follow-up doses. It is important not to miss your dose. Call your health care provider if you are unable to keep an appointment. What may interact with this medication? certain antibiotics given by injection NSAIDs, medicines for pain and inflammation, like ibuprofen or naproxen some diuretics like bumetanide, furosemide teriparatide thalidomide This list may not describe all possible interactions. Give your health care provider a list of all the medicines, herbs, non-prescription drugs, or dietary supplements you use. Also tell them if you smoke, drink alcohol, or use illegal drugs. Some items may interact with your medicine. What should I watch for while using this medication? Visit your health care provider for regular checks on your progress. It may be some time before you see the benefit from this drug. Some people who take this drug have severe bone, joint, or muscle pain. This drug may also increase your risk for jaw problems or a broken thigh bone. Tell your health care provider right away if you have severe pain in your jaw, bones, joints, or muscles. Tell you health care provider if you have any pain that does not go away or that gets worse. Tell your dentist and dental surgeon that you are taking this drug. You should not have major dental surgery while on this drug. See your dentist to have a dental exam and fix any dental problems before starting this drug. Take good care of your teeth while on this drug. Make sure you see your dentist for regular follow-up appointments. You should make sure you get enough calcium and vitamin D while you are taking this drug. Discuss the foods you eat and the vitamins you take with your  health care provider. Check with your health care provider if you have severe diarrhea, nausea, and vomiting, or if you sweat a lot. The loss of too much body fluid may make it dangerous for you to take this drug. You may need blood work done while you are taking this drug. Do not become pregnant while taking this drug. Women should inform their health care provider if they wish to become pregnant or think they might be pregnant. There is potential for serious harm to an unborn child. Talk to your health care provider for more information. What side effects may I notice from receiving this medication? Side effects that you should report to your doctor or health care provider as soon as possible: allergic reactions (skin rash,  itching or hives; swelling of the face, lips, or tongue) bone pain infection (fever, chills, cough, sore throat, pain or trouble passing urine) jaw pain, especially after dental work joint pain kidney injury (trouble passing urine or change in the amount of urine) low blood pressure (dizziness; feeling faint or lightheaded, falls; unusually weak or tired) low calcium levels (fast heartbeat; muscle cramps or pain; pain, tingling, or numbness in the hands or feet; seizures) low magnesium levels (fast, irregular heartbeat; muscle cramp or pain; muscle weakness; tremors; seizures) low red blood cell counts (trouble breathing; feeling faint; lightheaded, falls; unusually weak or tired) muscle pain redness, blistering, peeling, or loosening of the skin, including inside the mouth severe diarrhea swelling of the ankles, feet, hands trouble breathing Side effects that usually do not require medical attention (report to your doctor or health care provider if they continue or are bothersome): anxious constipation coughing depressed mood eye irritation, itching, or pain fever general ill feeling or flu-like symptoms nausea pain, redness, or irritation at site where  injected trouble sleeping This list may not describe all possible side effects. Call your doctor for medical advice about side effects. You may report side effects to FDA at 1-800-FDA-1088. Where should I keep my medication? This drug is given in a hospital or clinic. It will not be stored at home. NOTE: This sheet is a summary. It may not cover all possible information. If you have questions about this medicine, talk to your doctor, pharmacist, or health care provider.  2022 Elsevier/[employer] Standard (2021-01-13 00:00:00)

## 2021-05-25 NOTE — Telephone Encounter (Signed)
[name], MD  Georgette Shell, RN; Belva Chimes, LPN Yes,  I could see her at  1:30, labs at 1 pm and then go to infusion for IV fluids

## 2021-05-25 NOTE — Progress Notes (Signed)
[facility]  [address] [place],  [place]  [address] [phone]  Clinic Day:  05/25/2021  Referring physician: [name], MD  This document serves as a record of services personally performed by [name], MD. It was created on their behalf by [facility], a trained medical scribe. The creation of this record is based on the scribe's personal observations and the provider's statements to them.  ASSESSMENT & PLAN:   Stage IIB (T2c N1 M0) HER2 receptor positive left breast cancer, diagnosed in June 2020. This was ER/PR negative. This was treated with neoadjuvant Kadcyla/Perjeta and left mastectomy, with a complete response in breast and nodes. She had only received two doses of adjuvant Herceptin before being discontinued in December 2020.  History of stage II colon cancer, diagnosed in May 2007. This was treated with surgical resection and adjuvant chemotherapy with Xeloda. Colonoscopy from 2013 revealed 2 polyps which were resected and chronic diverticulosis.  Numerous heterogeneously enhancing, internally necrotic lesions in the right hepatic lobe, highly concerning for metastatic disease. Largest lesion measures up to 6.7 cm in hepatic segment VI, and there are at least 6 lesions.   Enhancing osseous lesions in the L2 vertebral body, both iliac bones, and in the sacrum, highly concerning for osseous metastases. She is scheduled for MRI lumbar spine on January 20th.   Left supraclavicular lymph node measuring 2-3 cm.   Lymphedema of the left upper extremity. She has a sleeve in place, but this is causing severe irritation. She continues to follow with Amy the lymphedema specialist.   Hypercalcemia, worse. We will administer IV fluids as well as Zometa.  Watery diarrhea for the past month and dehydration. Again, we are arranging for IV fluids today.   Biopsy of the left chest wall was obtained on January 12th by Dr. Lilia Pro. Pathology  is still pending but I have spoken with the pathologist and it does look consistent with breast primary. Prognostic profile has been ordered. She is scheduled for PET and ECHO on January 18th and MRI imaging of brain and spine on January 20th.  In view of her pain, I advised that she resume oxycodone 5 mg and use this at least 2-3 times daily, and I will be glad to send in a new prescription. If her pain continues to be uncontrolled, we could provide palliative radiation. Treatment options were briefly reviewed today but a final treatment plan will depend on whether this is HER2 positive. As she has worsening hypercalcemia, we will arrange for IV fluids today as well as Zometa. She and her husband understand and agree with this plan of care. I have answered their questions and she knows to call with any concerns.    I provided 30 minutes of face-to-face time during this this encounter and > 50% was spent counseling as documented under my assessment and plan.    [name], MD [facility] [address] [place] [address] Dept: [phone] Dept Fax: [phone]   CHIEF COMPLAINT:  CC: Stage IIB HER2 receptor positive left breast cancer with evidence of metastatic disease  Current Treatment:  Diagnostics    HISTORY OF PRESENT ILLNESS:  SHEKITA BOYDEN is a 88 y.o. female who presents for a transfer of care for there continued evaluation and management of stage IIB left breast cancer. She was originally diagnosed in June 2020 when she was able to palpate a mass of the left  breast. Mammogram and ultrasound from May 2020 revealed a 2.9 cm irregular mass in the upper outer left breast as well as two adjacent abnormal left axillary lymph nodes. Biopsy was obtained on June 3rd and revealed invasive ductal carcinoma and DCIS with lymphovascular invasion. Left axillary lymph node was positive for invasive ductal carcinoma.  HER2 was positive 3+. Estrogen and progesterone receptors were both negative at 0%, and Ki67 was 70%. Breast MRI from July revealed a 3.3 cm mass of the lateral portion of the left breast as well as significant non mass enhancement surrounding this mass and extending anteriorly into the nipple base, with largest diameter in the anterior to posterior axis, measuring 7.2 centimeters. Three enlarged left axillary lymph nodes. Right breast was negative.  PET imaging from July revealed no evidence of additional metastatic involvement. She received neoadjuvant Kadcyla/Perjeta for 6 cycles. Repeat breast MRI from October revealed complete resolution of the enhancing mass and associated non mass enhancement within the left breast. Previously identified left axillary lymphadenopathy was no longer identified. She underwent left mastectomy and targeted lymph node dissection in November 2020 and final pathology from this procedure confirmed no evidence of malignancy, for a complete response. Three sentinel lymph nodes were negative for malignancy (0/3). She continued maintenance Herceptin injections for two more doses, but this was discontinued in December 2020 as she refused further ECHO scans.   Of note, she also has a history of stage II colon cancer, diagnosed in May 2007. Preop CEA was 5.2. She underwent surgical resection and surgical pathology revealed a 5.9 x 3.8 x 1.2 cm moderate to well-differentiated adenocarcinoma penetrating the muscular wall. Forty lymph nodes negative. No vascular or lymphatic invasion. Multiple diverticula with microabscess formation and inflammation. Carcinoma present in at least 1 diverticulum. She received adjuvant chemotherapy with Xeloda for 9 months. Colonoscopy from August 2013 revealed 2 polyps which were resected and chronic diverticulosis.  Caylie states that she presented to [facility] ER on December 9th due to constipation and back pain with spasms. CT abdomen/pelvis from December  9th revealed new lytic lesion involving the L2 vertebral body with minimal cortical breakthrough superiorly, but with preservation of vertebral body height, concerning for metastatic disease. There are several indeterminate hepatic lesions. In addition to the lesions, there is an additional subtle 1.5 cm hypodensity in the hepatic dome. MRI abdomen from January 3rd revealed numerous heterogeneously enhancing, internally necrotic lesions in the right hepatic lobe, highly concerning for metastatic disease. Largest lesion measures up to 6.7 cm in hepatic segment VI. Enhancing osseous lesions in the L2 vertebral body, both iliac bones, and in the sacrum, highly concerning for osseous metastases. She has chronic lymphedema of the left upper extremity since November 2022 after an accident, for which she wears a compression sleeve. However, her skin is reacting to the plastic cuff of the sleeve, even though she has tried turning it inside out. She is limited in her mobility of the right upper extremity due to prior incident of dislocated shoulder. I met with her on January 6th and reviewed the imaging. I recommended further evaluation before finalizing a treatment plan. We scheduled a PET scan, MRI brain and spine, and ECHO. We also referred her to Dr. Lilia Pro for node biopsy and port placement. When he saw her, he found a nodule in the upper left chest wall which was suspicious and biopsied this rather than the left supraclavicular node.   INTERVAL HISTORY:  I have reviewed her chart and materials related to her cancer  extensively and collaborated history with the patient. Summary of oncologic history is as follows: Oncology History Overview Note  Cancer Staging Malignant neoplasm of upper-outer quadrant of left breast in female, estrogen receptor negative ([place]) Staging form: Breast, AJCC 8th Edition - Clinical stage from 10/11/2018: Stage IIB (cT2, cN1, cM0, G3, ER-, PR-, HER2+) - Signed by Truitt Merle, MD on  11/02/2018 - Clinical: No stage assigned - Unsigned    History of colon cancer, stage II  09/2005 Initial Diagnosis   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 Findings were a 5.9 x 3.8 x 1.2 cm moderate to well-differentiated adenocarcinoma penetrating the muscular wall. Forty lymph nodes negative. No vascular or lymphatic invasion. Multiple diverticula with microabscess formation and inflammation. Carcinoma present in at least 1 diverticulum. Preop CEA 5.2 with lab normal range 0 to 2.5. Preop CT scan with no obvious additional pathology.    2007 -  Chemotherapy   She received oral Xeloda chemotherapy as an adjuvant. She declined treatment on a clinical trial.    11/12/2011 Initial Diagnosis   [name], stage II   12/09/2011 Procedure   Followup colonoscopy done on 12/09/2011. She was found to have 2 polyps which were removed. Chronic diverticulosis.     Malignant neoplasm of upper-outer quadrant of left breast in female, estrogen receptor negative ([place])  09/26/2018 Mammogram   Mammogram/US of left breast 09/26/18 IMPRESSION:  1. 2.9cm irregular mass in the upper outer left breast corresponds to the palpable abnormality. This is highly suspicious for breast carcinoma.  2. Two adjacent abnormal left axillary  Lymph nodes suspicious for metastatic adenopathy. There is a third borderline abnormal left axillary LN with a cortex thickened to 54m.  3. Benign right breast cyst. No evidence of right breast malignancy.    10/11/2018 Cancer Staging   Staging form: Breast, AJCC 8th Edition - Clinical stage from 10/11/2018: Stage IIB (cT2, cN1, cM0, G3, ER-, PR-, HER2+) - Signed by [name] MD on 11/02/2018    10/11/2018 Initial Biopsy   Diagnosis 10/11/18 1. Breast, left, needle core biopsy, upper outer left 2 o'clock - INVASIVE DUCTAL CARCINOMA. - DUCTAL CARCINOMA IN SITU. - LYMPHOVASCULAR INVASION  IS IDENTIFIED. - SEE COMMENT. 2. Lymph node, needle/core biopsy, left axilla - INVASIVE DUCTAL CARCINOMA. - SEE COMMENT.   10/11/2018 Receptors her2   The tumor cells are POSITIVE for Her2 (3+). Estrogen Receptor: 0%, NEGATIVE Progesterone Receptor: 0%, NEGATIVE Proliferation Marker Ki67: 70%   11/02/2018 Initial Diagnosis   Malignant neoplasm of upper-outer quadrant of left breast in female, estrogen receptor negative ([place]   11/15/2018 Breast MRI   MRI breast 11/15/18  IMPRESSION: 1. 3.3 centimeter mass in the LATERAL portion of the LEFT breast consistent with known malignancy. 2. There is significant non mass enhancement surrounding this mass and extending anteriorly into the nipple base, with largest diameter in the anterior to posterior axis, measuring 7.2 centimeters. 3. If the patient would consider breast conservation, additional MR guided core biopsies are recommended. Consider biopsy of the inferior and anterior extent of the non mass enhancement to document extent of disease. 4. Three enlarged LEFT axillary lymph nodes. 5. RIGHT breast is negative.   11/15/2018 PET scan   PET 11/15/18 IMPRESSION: Hypermetabolic left breast lesion compatible with known primary. Hypermetabolic left axillary lymph nodes are consistent with metastatic disease.   No evidence for additional hypermetabolic metastatic involvement in the neck, chest,  abdomen, or pelvis.   11/17/2018 - 03/01/2019 Chemotherapy   Neo-adjuvant Kadcyla and perjeta q3weeks for 6 cycles starting 11/17/18. Stopped before surgery    11/29/2018 Pathology Results   Diagnosis 1. Breast, left, needle core biopsy, inferior anterior (cylinder clip) - INVASIVE DUCTAL CARCINOMA. - DUCTAL CARCINOMA IN SITU. - LYMPHOVASCULAR INVASION IS IDENTIFIED. - SEE COMMENT. 2. Breast, left, needle core biopsy, central posterior (barbell clip) - INVASIVE DUCTAL CARCINOMA. - LYMPHOVASCULAR INVASION IS IDENTIFIED.   02/12/2019 Breast MRI    IMPRESSION: 1. Complete resolution of previously identified enhancing mass and associated non mass enhancement within the left breast. This is consistent with excellent response to chemotherapy. No residual or suspicious findings are identified. 2. No MRI evidence of malignancy on the right. 3. Previously identified left axillary lymphadenopathy not definitively seen on today's study. However, this may be due to decreased field-of-view compared to prior study.     03/14/2019 Cancer Staging   Staging form: Breast, AJCC 8th Edition - Pathologic stage from 03/14/2019: pT0, pN0, cM0, GX, ER: Unknown, PR: Unknown, HER2: Not Assessed - Signed by Truitt Merle, MD on 03/28/2019    03/14/2019 Surgery   LEFT MASTECTOMY WITH TARGETED LYMPH NODE DISSECTION and Left Axillary Sentinel Lymph  Node Biopsy by Dr [name] 03/14/19    03/14/2019 Pathology Results   FINAL MICROSCOPIC DIAGNOSIS:   A. LYMPH NODE, LEFT, SENTINEL, BIOPSY:  - There is no evidence of carcinoma in 1 of 1 lymph node (0/1).   B. BREAST, LEFT, MASTECTOMY:  - Benign breast parenchyma with treatment-related changes.  - There is no evidence of malignancy.  - See oncology table below.   C. LYMPH NODE, LEFT #1, SENTINEL, BIOPSY:  - There is no evidence of carcinoma in 1 of 1 lymph node (0/1).   D. LYMPH NODE, LEFT #2, SENTINEL, BIOPSY:  - There is no evidence of carcinoma in 1 of 1 lymph node (0/1).     04/04/2019 - 04/25/2019 Chemotherapy   Maintenance Herceptin injections every 3 weeks starting 04/03/19 to complete 1 year of treatment that was started in 11/2018. Stopped after 2nd dose as she will not be repeating Echos.     Zyair was added onto the schedule today due to watery diarrhea and dehydration. She states that she has had watery diarrhea since early December. She also continues to have significant back pain, which is constant and worsened with activity. It is relieved with rest. She did have some relief with oxycodone 5 mg on  one occasion, but states that it did not relieve her pain by the 2nd time. Therefore, she has not taken any more doses. I advised that she try this again and use it more routinely. As she was only given 10 pills, I will send in a new prescription if this gives her adequate relief. I have asked her to let me know. She underwent biopsy of a chest wall nodule on January 12th with Dr. Lilia Pro. The pathology is pending but I have spoken with the pathologist and this is malignant with CK7 positive, estrogen staining is mostly negative but there is some faint positivity. This is felt to be consistent with breast primary and prognostic profile has been ordered. The patient once again states that no one has explained or investigated her problems, even though I know I did so myself last week. She also at one point denied that she had ever had breast cancer until we reviewed this again in detail. Blood counts are normal and chemistries are remarkable  for a BUN of 18, a calcium of 12.6, up from 11.3 and mildly worsened liver function tests with an SGOT of 137, an SGPT of 43 and an alkaline phosphatase of 269. Her  appetite is fair, and she has lost another 3 and 1/2 pounds since her last visit.  She denies fever, chills or other signs of infection.  She denies nausea, vomiting or abdominal pain.  She denies sore throat, cough, dyspnea, or chest pain.   HISTORY:   Allergies: No Known Allergies  Current Medications: Current Outpatient Medications  Medication Sig Dispense Refill   calcium citrate-vitamin D (CITRACAL+D) 315-200 MG-UNIT per tablet Take 1 tablet by mouth daily.     fish oil-omega-3 fatty acids 1000 MG capsule Take 1 g by mouth daily.     ondansetron (ZOFRAN) 8 MG tablet Take 1 tablet (8 mg total) by mouth every 8 (eight) hours as needed for nausea or vomiting. (Patient not taking: Reported on 03/07/2019) 20 tablet 2   oxyCODONE (OXY IR/ROXICODONE) 5 MG immediate release tablet Take 5 mg by mouth every 4  (four) hours as needed.     No current facility-administered medications for this visit.    REVIEW OF SYSTEMS:  Review of Systems  Constitutional:  Positive for unexpected weight change (weight loss). Negative for appetite change, chills, fatigue and fever.  HENT:  Negative.    Eyes: Negative.   Respiratory: Negative.  Negative for chest tightness, cough, hemoptysis, shortness of breath and wheezing.   Cardiovascular: Negative.  Negative for chest pain, leg swelling and palpitations.  Gastrointestinal:  Positive for diarrhea. Negative for abdominal distention, abdominal pain, blood in stool, constipation, nausea and vomiting.  Endocrine: Negative.   Genitourinary: Negative.  Negative for difficulty urinating, dysuria, frequency and hematuria.   Musculoskeletal:  Positive for back pain (which has worsened). Negative for arthralgias, flank pain, gait problem and myalgias.       Lymphedema of the left upper extremity  Skin: Negative.   Neurological: Negative.  Negative for dizziness, extremity weakness, gait problem, headaches, light-headedness, numbness, seizures and speech difficulty.  Hematological: Negative.   Psychiatric/Behavioral: Negative.  Negative for depression and sleep disturbance. The patient is not nervous/anxious.      VITALS:  Blood pressure (!) 196/88, pulse 93, temperature 97.8 F (36.6 C), temperature source Oral, resp. rate 18, height 5' 1"  (1.549 m), weight 119 lb 11.2 oz (54.3 kg), SpO2 95 %.  Wt Readings from Last 3 Encounters:  05/25/21 119 lb 11.2 oz (54.3 kg)  05/15/21 123 lb (55.8 kg)  04/25/19 132 lb 12.8 oz (60.2 kg)    Body mass index is 22.62 kg/m.  Performance status (ECOG): 1 - Symptomatic but completely ambulatory  PHYSICAL EXAM:  Physical Exam Constitutional:      General: She is not in acute distress.    Appearance: Normal appearance. She is normal weight.  HENT:     Head: Normocephalic and atraumatic.  Eyes:     General: No scleral  icterus.    Extraocular Movements: Extraocular movements intact.     Conjunctiva/sclera: Conjunctivae normal.     Pupils: Pupils are equal, round, and reactive to light.  Cardiovascular:     Rate and Rhythm: Normal rate and regular rhythm.     Pulses: Normal pulses.     Heart sounds: Normal heart sounds. No murmur heard.   No friction rub. No gallop.  Pulmonary:     Effort: Pulmonary effort is normal. No respiratory distress.     Breath sounds:  Normal breath sounds.  Abdominal:     General: Bowel sounds are normal. There is no distension.     Palpations: Abdomen is soft. There is no hepatomegaly, splenomegaly or mass.     Tenderness: There is no abdominal tenderness.     Comments: Nodular area of the mid lower liver  Musculoskeletal:        General: Normal range of motion.     Cervical back: Normal range of motion and neck supple.     Right lower leg: No edema.     Left lower leg: No edema.     Comments: Upper arm is still erythematous and warm with some lymphedema  Lymphadenopathy:     Cervical: No cervical adenopathy.     Upper Body:     Left upper body: Supraclavicular adenopathy (large nodule measuring 3-4 cm in diameter) present.  Skin:    General: Skin is warm and dry.     Comments: Skin turgor decreased  Neurological:     General: No focal deficit present.     Mental Status: She is alert and oriented to person, place, and time. Mental status is at baseline.  Psychiatric:        Mood and Affect: Mood normal.        Behavior: Behavior normal.        Thought Content: Thought content normal.        Judgment: Judgment normal.     LABS:   CBC Latest Ref Rng & Units 05/25/2021 05/15/2021 04/25/2019  WBC - 8.5 8.8 5.8  Hemoglobin 12.0 - 16.0 14.4 14.6 13.7  Hematocrit 36 - 46 44 43 41.8  Platelets 150 - 399 197 220 150   CMP Latest Ref Rng & Units 05/25/2021 05/15/2021 04/25/2019  Glucose 70 - 99 mg/dL - - 104(H)  BUN 4 - 21 18 16 17   Creatinine 0.5 - 1.1 0.9 0.7 0.90   Sodium 137 - 147 142 140 141  Potassium 3.4 - 5.3 3.9 4.2 4.3  Chloride 99 - 108 104 106 105  CO2 13 - 22 32(A) 27(A) 27  Calcium 8.7 - 10.7 12.6(A) 11.3(A) 9.4  Total Protein 6.5 - 8.1 g/dL - - 7.4  Total Bilirubin 0.3 - 1.2 mg/dL - - 0.5  Alkaline Phos 25 - 125 269(A) 252(A) 90  AST 13 - 35 137(A) 97(A) 41  ALT 7 - 35 43(A) 38(A) 29     No results found for: CEA1 / No results found for: CEA1  Lab Results  Component Value Date   LDH 186 11/12/2011   LDH 187 09/08/2010   LDH 171 09/09/2009    STUDIES:  No results found.    I, Rita Ohara, am acting as scribe for [name], MD  I have reviewed this report as typed by the medical scribe, and it is complete and accurate.

## 2021-05-26 ENCOUNTER — Other Ambulatory Visit: Payer: Self-pay | Admitting: Oncology

## 2021-05-26 DIAGNOSIS — C7951 Secondary malignant neoplasm of bone: Secondary | ICD-10-CM

## 2021-05-26 MED ORDER — OXYCODONE HCL 5 MG PO TABS: 5.0000 mg | ORAL_TABLET | ORAL | 0 refills | Status: DC | PRN

## 2021-05-27 ENCOUNTER — Encounter: Payer: Self-pay | Admitting: Hematology

## 2021-05-27 ENCOUNTER — Encounter: Payer: Self-pay | Admitting: Oncology

## 2021-05-27 DIAGNOSIS — I361 Nonrheumatic tricuspid (valve) insufficiency: Secondary | ICD-10-CM | POA: Diagnosis not present

## 2021-05-27 DIAGNOSIS — I351 Nonrheumatic aortic (valve) insufficiency: Secondary | ICD-10-CM | POA: Diagnosis not present

## 2021-05-28 NOTE — Progress Notes (Incomplete)
[facility]  [address] [place],  [place]  [address] [phone]  Clinic Day:  05/28/2021  Referring physician: [name], MD  This document serves as a record of services personally performed by [name], MD. It was created on their behalf by [facility], a trained medical scribe. The creation of this record is based on the scribe's personal observations and the provider's statements to them.  ASSESSMENT & PLAN:   Stage IIB (T2c N1 M0) HER2 receptor positive left breast cancer, diagnosed in June 2020. This was ER/PR negative. This was treated with neoadjuvant Kadcyla/Perjeta and left mastectomy, with a complete response in breast and nodes. She had only received two doses of adjuvant Herceptin before being discontinued in December 2020.  History of stage II colon cancer, diagnosed in May 2007. This was treated with surgical resection and adjuvant chemotherapy with Xeloda. Colonoscopy from 2013 revealed 2 polyps which were resected and chronic diverticulosis.  Numerous heterogeneously enhancing, internally necrotic lesions in the right hepatic lobe, highly concerning for metastatic disease. Largest lesion measures up to 6.7 cm in hepatic segment VI, and there are at least 6 lesions.   Enhancing osseous lesions in the L2 vertebral body, both iliac bones, and in the sacrum, highly concerning for osseous metastases. She is scheduled for MRI lumbar spine on January 20th.   Left supraclavicular lymph node measuring 2-3 cm.   Lymphedema of the left upper extremity. She has a sleeve in place, but this is causing severe irritation. She continues to follow with Amy the lymphedema specialist.   Hypercalcemia, worse. We will administer IV fluids as well as Zometa.  Watery diarrhea for the past month and dehydration. Again, we are arranging for IV fluids today.   Biopsy of the left chest wall was obtained on January 12th by Dr. Lilia Pro. Pathology  is still pending but I have spoken with the pathologist and it does look consistent with breast primary. Prognostic profile has been ordered. She is scheduled for PET and ECHO on January 18th and MRI imaging of brain and spine on January 20th.  In view of her pain, I advised that she resume oxycodone 5 mg and use this at least 2-3 times daily, and I will be glad to send in a new prescription. If her pain continues to be uncontrolled, we could provide palliative radiation. Treatment options were briefly reviewed today but a final treatment plan will depend on whether this is HER2 positive. As she has worsening hypercalcemia, we will arrange for IV fluids today as well as Zometa. She and her husband understand and agree with this plan of care. I have answered their questions and she knows to call with any concerns.    I provided 30 minutes of face-to-face time during this this encounter and > 50% was spent counseling as documented under my assessment and plan.    [name], MD [facility] [address] [place] [address] Dept: [phone] Dept Fax: [phone]   CHIEF COMPLAINT:  CC: Stage IIB HER2 receptor positive left breast cancer with evidence of metastatic disease  Current Treatment:  Diagnostics    HISTORY OF PRESENT ILLNESS:  Jasmin Lewis is a 88 y.o. female who presents for a transfer of care for there continued evaluation and management of stage IIB left breast cancer. She was originally diagnosed in June 2020 when she was able to palpate a mass of the left  breast. Mammogram and ultrasound from May 2020 revealed a 2.9 cm irregular mass in the upper outer left breast as well as two adjacent abnormal left axillary lymph nodes. Biopsy was obtained on June 3rd and revealed invasive ductal carcinoma and DCIS with lymphovascular invasion. Left axillary lymph node was positive for invasive ductal carcinoma.  HER2 was positive 3+. Estrogen and progesterone receptors were both negative at 0%, and Ki67 was 70%. Breast MRI from July revealed a 3.3 cm mass of the lateral portion of the left breast as well as significant non mass enhancement surrounding this mass and extending anteriorly into the nipple base, with largest diameter in the anterior to posterior axis, measuring 7.2 centimeters. Three enlarged left axillary lymph nodes. Right breast was negative.  PET imaging from July revealed no evidence of additional metastatic involvement. She received neoadjuvant Kadcyla/Perjeta for 6 cycles. Repeat breast MRI from October revealed complete resolution of the enhancing mass and associated non mass enhancement within the left breast. Previously identified left axillary lymphadenopathy was no longer identified. She underwent left mastectomy and targeted lymph node dissection in November 2020 and final pathology from this procedure confirmed no evidence of malignancy, for a complete response. Three sentinel lymph nodes were negative for malignancy (0/3). She continued maintenance Herceptin injections for two more doses, but this was discontinued in December 2020 as she refused further ECHO scans.   Of note, she also has a history of stage II colon cancer, diagnosed in May 2007. Preop CEA was 5.2. She underwent surgical resection and surgical pathology revealed a 5.9 x 3.8 x 1.2 cm moderate to well-differentiated adenocarcinoma penetrating the muscular wall. Forty lymph nodes negative. No vascular or lymphatic invasion. Multiple diverticula with microabscess formation and inflammation. Carcinoma present in at least 1 diverticulum. She received adjuvant chemotherapy with Xeloda for 9 months. Colonoscopy from August 2013 revealed 2 polyps which were resected and chronic diverticulosis.  Jasmin states that she presented to [facility] ER on December 9th due to constipation and back pain with spasms. CT abdomen/pelvis from December  9th revealed new lytic lesion involving the L2 vertebral body with minimal cortical breakthrough superiorly, but with preservation of vertebral body height, concerning for metastatic disease. There are several indeterminate hepatic lesions. In addition to the lesions, there is an additional subtle 1.5 cm hypodensity in the hepatic dome. MRI abdomen from January 3rd revealed numerous heterogeneously enhancing, internally necrotic lesions in the right hepatic lobe, highly concerning for metastatic disease. Largest lesion measures up to 6.7 cm in hepatic segment VI. Enhancing osseous lesions in the L2 vertebral body, both iliac bones, and in the sacrum, highly concerning for osseous metastases. She has chronic lymphedema of the left upper extremity since November 2022 after an accident, for which she wears a compression sleeve. However, her skin is reacting to the plastic cuff of the sleeve, even though she has tried turning it inside out. She is limited in her mobility of the right upper extremity due to prior incident of dislocated shoulder. I met with her on January 6th and reviewed the imaging. I recommended further evaluation before finalizing a treatment plan. We scheduled a PET scan, MRI brain and spine, and ECHO. We also referred her to Dr. Lilia Pro for node biopsy and port placement. When he saw her, he found a nodule in the upper left chest wall which was suspicious and biopsied this rather than the left supraclavicular node.   INTERVAL HISTORY:  I have reviewed her chart and materials related to her cancer  extensively and collaborated history with the patient. Summary of oncologic history is as follows: Oncology History Overview Note  Cancer Staging Malignant neoplasm of upper-outer quadrant of left breast in female, estrogen receptor negative ([place]) Staging form: Breast, AJCC 8th Edition - Clinical stage from 10/11/2018: Stage IIB (cT2, cN1, cM0, G3, ER-, PR-, HER2+) - Signed by Truitt Merle, MD on  11/02/2018 - Clinical: No stage assigned - Unsigned    History of colon cancer, stage II  09/2005 Initial Diagnosis   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 Findings were a 5.9 x 3.8 x 1.2 cm moderate to well-differentiated adenocarcinoma penetrating the muscular wall. Forty lymph nodes negative. No vascular or lymphatic invasion. Multiple diverticula with microabscess formation and inflammation. Carcinoma present in at least 1 diverticulum. Preop CEA 5.2 with lab normal range 0 to 2.5. Preop CT scan with no obvious additional pathology.    2007 -  Chemotherapy   She received oral Xeloda chemotherapy as an adjuvant. She declined treatment on a clinical trial.    11/12/2011 Initial Diagnosis   [name], stage II   12/09/2011 Procedure   Followup colonoscopy done on 12/09/2011. She was found to have 2 polyps which were removed. Chronic diverticulosis.     Malignant neoplasm of upper-outer quadrant of left breast in female, estrogen receptor negative ([place])  09/26/2018 Mammogram   Mammogram/US of left breast 09/26/18 IMPRESSION:  1. 2.9cm irregular mass in the upper outer left breast corresponds to the palpable abnormality. This is highly suspicious for breast carcinoma.  2. Two adjacent abnormal left axillary  Lymph nodes suspicious for metastatic adenopathy. There is a third borderline abnormal left axillary LN with a cortex thickened to 54m.  3. Benign right breast cyst. No evidence of right breast malignancy.    10/11/2018 Cancer Staging   Staging form: Breast, AJCC 8th Edition - Clinical stage from 10/11/2018: Stage IIB (cT2, cN1, cM0, G3, ER-, PR-, HER2+) - Signed by [name] MD on 11/02/2018   10/11/2018 Initial Biopsy   Diagnosis 10/11/18 1. Breast, left, needle core biopsy, upper outer left 2 o'clock - INVASIVE DUCTAL CARCINOMA. - DUCTAL CARCINOMA IN SITU. - LYMPHOVASCULAR INVASION  IS IDENTIFIED. - SEE COMMENT. 2. Lymph node, needle/core biopsy, left axilla - INVASIVE DUCTAL CARCINOMA. - SEE COMMENT.   10/11/2018 Receptors her2   The tumor cells are POSITIVE for Her2 (3+). Estrogen Receptor: 0%, NEGATIVE Progesterone Receptor: 0%, NEGATIVE Proliferation Marker Ki67: 70%   11/02/2018 Initial Diagnosis   Malignant ne[place] of upper-outer quadrant of left breast in [place], [place]   11/15/2018 Breast MRI   MRI breast 11/15/18  IMPRESSION: 1. 3.3 centimeter mass in the LATERAL portion of the LEFT breast consistent with known malignancy. 2. There is significant non mass enhancement surrounding this mass and extending anteriorly into the nipple base, with largest diameter in the anterior to posterior axis, measuring 7.2 centimeters. 3. If the patient would consider breast conservation, additional MR guided core biopsies are recommended. Consider biopsy of the inferior and anterior extent of the non mass enhancement to document extent of disease. 4. Three enlarged LEFT axillary lymph nodes. 5. RIGHT breast is negative.   11/15/2018 PET scan   PET 11/15/18 IMPRESSION: Hypermetabolic left breast lesion compatible with known primary. Hypermetabolic left axillary lymph nodes are consistent with metastatic disease.   No evidence for additional hypermetabolic metastatic involvement in the neck, chest, abdomen,  or pelvis.   11/17/2018 - 03/01/2019 Chemotherapy   Neo-adjuvant Kadcyla and perjeta q3weeks for 6 cycles starting 11/17/18. Stopped before surgery    11/29/2018 Pathology Results   Diagnosis 1. Breast, left, needle core biopsy, inferior anterior (cylinder clip) - INVASIVE DUCTAL CARCINOMA. - DUCTAL CARCINOMA IN SITU. - LYMPHOVASCULAR INVASION IS IDENTIFIED. - SEE COMMENT. 2. Breast, left, needle core biopsy, central posterior (barbell clip) - INVASIVE DUCTAL CARCINOMA. - LYMPHOVASCULAR INVASION IS IDENTIFIED.   02/12/2019 Breast MRI    IMPRESSION: 1. Complete resolution of previously identified enhancing mass and associated non mass enhancement within the left breast. This is consistent with excellent response to chemotherapy. No residual or suspicious findings are identified. 2. No MRI evidence of malignancy on the right. 3. Previously identified left axillary lymphadenopathy not definitively seen on today's study. However, this may be due to decreased field-of-view compared to prior study.     03/14/2019 Cancer Staging   Staging form: Breast, AJCC 8th Edition - Pathologic stage from 03/14/2019: pT0, pN0, cM0, GX, ER: Unknown, PR: Unknown, HER2: Not Assessed - Signed by Truitt Merle, MD on 03/28/2019   03/14/2019 Surgery   LEFT MASTECTOMY WITH TARGETED LYMPH NODE DISSECTION and Left Axillary Sentinel Lymph  Node Biopsy by Dr [name] 03/14/19    03/14/2019 Pathology Results   FINAL MICROSCOPIC DIAGNOSIS:   A. LYMPH NODE, LEFT, SENTINEL, BIOPSY:  - There is no evidence of carcinoma in 1 of 1 lymph node (0/1).   B. BREAST, LEFT, MASTECTOMY:  - Benign breast parenchyma with treatment-related changes.  - There is no evidence of malignancy.  - See oncology table below.   C. LYMPH NODE, LEFT #1, SENTINEL, BIOPSY:  - There is no evidence of carcinoma in 1 of 1 lymph node (0/1).   D. LYMPH NODE, LEFT #2, SENTINEL, BIOPSY:  - There is no evidence of carcinoma in 1 of 1 lymph node (0/1).     04/04/2019 - 04/25/2019 Chemotherapy   Maintenance Herceptin injections every 3 weeks starting 04/03/19 to complete 1 year of treatment that was started in 11/2018. Stopped after 2nd dose as she will not be repeating Echos.     Maniya was added onto the schedule today due to watery diarrhea and dehydration. She states that she has had watery diarrhea since early December. She also continues to have significant back pain, which is constant and worsened with activity. It is relieved with rest. She did have some relief with oxycodone 5 mg on  one occasion, but states that it did not relieve her pain by the 2nd time. Therefore, she has not taken any more doses. I advised that she try this again and use it more routinely. As she was only given 10 pills, I will send in a new prescription if this gives her adequate relief. I have asked her to let me know. She underwent biopsy of a chest wall nodule on January 12th with Dr. Lilia Pro. The pathology is pending but I have spoken with the pathologist and this is malignant with CK7 positive, estrogen staining is mostly negative but there is some faint positivity. This is felt to be consistent with breast primary and prognostic profile has been ordered. The patient once again states that no one has explained or investigated her problems, even though I know I did so myself last week. She also at one point denied that she had ever had breast cancer until we reviewed this again in detail. Blood counts are normal and chemistries are remarkable for a  BUN of 18, a calcium of 12.6, up from 11.3 and mildly worsened liver function tests with an SGOT of 137, an SGPT of 43 and an alkaline phosphatase of 269. Her  appetite is fair, and she has lost another 3 and 1/2 pounds since her last visit.  She denies fever, chills or other signs of infection.  She denies nausea, vomiting or abdominal pain.  She denies sore throat, cough, dyspnea, or chest pain.   Tulani is here for follow up ***. Final pathology from the chest wall biopsy from January 12 revealed carcinoma, 5 mm nodule, involving dermis, subcutaneous tissue and a lymphovascular space. Immunohistochemistry is positive for CK7, GATA-3 AND GCDFP and ER are weakly positive. CK20 negative. This is compatible with metastatic breast cancer. Estrogen and progesterone receptors were both negative at 0%. HER2 was positive (3+). Ki67 was 70%. PET imaging from January 18th revealed widespread recurrent/metastatic disease. Left chest wall recurrence with left axillary/subpectoral,  supraclavicular nodal metastasis. Hepatic metastasis with an index inferior right hepatic lobe mass with central necrosis measuring 5.2 x 7.5 cm. As well as left adrenal metastasis, and widespread osseous metastasis with an index left iliac lesion measuring 1.8 cm. ECHO was normal with an EF between 60-65%.   Her  appetite is good, and she has gained/lost _ pounds since her last visit.  She denies fever, chills or other signs of infection.  She denies nausea, vomiting, bowel issues, or abdominal pain.  She denies sore throat, cough, dyspnea, or chest pain.  HISTORY:   Allergies: No Known Allergies  Current Medications: Current Outpatient Medications  Medication Sig Dispense Refill   calcium citrate-vitamin D (CITRACAL+D) 315-200 MG-UNIT per tablet Take 1 tablet by mouth daily.     fish oil-omega-3 fatty acids 1000 MG capsule Take 1 g by mouth daily.     ondansetron (ZOFRAN) 8 MG tablet Take 1 tablet (8 mg total) by mouth every 8 (eight) hours as needed for nausea or vomiting. (Patient not taking: Reported on 03/07/2019) 20 tablet 2   oxyCODONE (OXY IR/ROXICODONE) 5 MG immediate release tablet Take 1 tablet (5 mg total) by mouth every 4 (four) hours as needed. 100 tablet 0   No current facility-administered medications for this visit.    REVIEW OF SYSTEMS:  Review of Systems  Constitutional:  Positive for unexpected weight change (weight loss). Negative for appetite change, chills, fatigue and fever.  HENT:  Negative.    Eyes: Negative.   Respiratory: Negative.  Negative for chest tightness, cough, hemoptysis, shortness of breath and wheezing.   Cardiovascular: Negative.  Negative for chest pain, leg swelling and palpitations.  Gastrointestinal:  Positive for diarrhea. Negative for abdominal distention, abdominal pain, blood in stool, constipation, nausea and vomiting.  Endocrine: Negative.   Genitourinary: Negative.  Negative for difficulty urinating, dysuria, frequency and hematuria.    Musculoskeletal:  Positive for back pain (which has worsened). Negative for arthralgias, flank pain, gait problem and myalgias.       Lymphedema of the left upper extremity  Skin: Negative.   Neurological: Negative.  Negative for dizziness, extremity weakness, gait problem, headaches, light-headedness, numbness, seizures and speech difficulty.  Hematological: Negative.   Psychiatric/Behavioral: Negative.  Negative for depression and sleep disturbance. The patient is not nervous/anxious.      VITALS:  There were no vitals taken for this visit.  Wt Readings from Last 3 Encounters:  05/25/21 119 lb (54 kg)  05/25/21 119 lb 11.2 oz (54.3 kg)  05/15/21 123 lb (55.8 kg)  There is no height or weight on file to calculate BMI.  Performance status (ECOG): 1 - Symptomatic but completely ambulatory  PHYSICAL EXAM:  Physical Exam Constitutional:      General: She is not in acute distress.    Appearance: Normal appearance. She is normal weight.  HENT:     Head: Normocephalic and atraumatic.  Eyes:     General: No scleral icterus.    Extraocular Movements: Extraocular movements intact.     Conjunctiva/sclera: Conjunctivae normal.     Pupils: Pupils are equal, round, and reactive to light.  Cardiovascular:     Rate and Rhythm: Normal rate and regular rhythm.     Pulses: Normal pulses.     Heart sounds: Normal heart sounds. No murmur heard.   No friction rub. No gallop.  Pulmonary:     Effort: Pulmonary effort is normal. No respiratory distress.     Breath sounds: Normal breath sounds.  Abdominal:     General: Bowel sounds are normal. There is no distension.     Palpations: Abdomen is soft. There is no hepatomegaly, splenomegaly or mass.     Tenderness: There is no abdominal tenderness.     Comments: Nodular area of the mid lower liver  Musculoskeletal:        General: Normal range of motion.     Cervical back: Normal range of motion and neck supple.     Right lower leg: No edema.      Left lower leg: No edema.     Comments: Upper arm is still erythematous and warm with some lymphedema  Lymphadenopathy:     Cervical: No cervical adenopathy.     Upper Body:     Left upper body: Supraclavicular adenopathy (large nodule measuring 3-4 cm in diameter) present.  Skin:    General: Skin is warm and dry.     Comments: Skin turgor decreased  Neurological:     General: No focal deficit present.     Mental Status: She is alert and oriented to person, place, and time. Mental status is at baseline.  Psychiatric:        Mood and Affect: Mood normal.        Behavior: Behavior normal.        Thought Content: Thought content normal.        Judgment: Judgment normal.     LABS:   CBC Latest Ref Rng & Units 05/25/2021 05/15/2021 04/25/2019  WBC - 8.5 8.8 5.8  Hemoglobin 12.0 - 16.0 14.4 14.6 13.7  Hematocrit 36 - 46 44 43 41.8  Platelets 150 - 399 197 220 150   CMP Latest Ref Rng & Units 05/25/2021 05/15/2021 04/25/2019  Glucose 70 - 99 mg/dL - - 104(H)  BUN 4 - 21 18 16 17   Creatinine 0.5 - 1.1 0.9 0.7 0.90  Sodium 137 - 147 142 140 141  Potassium 3.4 - 5.3 3.9 4.2 4.3  Chloride 99 - 108 104 106 105  CO2 13 - 22 32(A) 27(A) 27  Calcium 8.7 - 10.7 12.6(A) 11.3(A) 9.4  Total Protein 6.5 - 8.1 g/dL - - 7.4  Total Bilirubin 0.3 - 1.2 mg/dL - - 0.5  Alkaline Phos 25 - 125 269(A) 252(A) 90  AST 13 - 35 137(A) 97(A) 41  ALT 7 - 35 43(A) 38(A) 29     No results found for: CEA1 / No results found for: CEA1  Lab Results  Component Value Date   LDH 186 11/12/2011  LDH 187 09/08/2010   LDH 171 09/09/2009    STUDIES:  No results found.    EXAM: 05/27/2021 NUCLEAR MEDICINE PET SKULL BASE TO THIGH   TECHNIQUE:  15.4 mCi F-18 FDG was injected intravenously. Full-ring PET imaging  was performed from the skull base to thigh after the radiotracer. CT  data was obtained and used for attenuation correction and anatomic  localization.  Fasting blood glucose: 125 mg/dl    COMPARISON: PET 11/15/2018 from [facility]   FINDINGS:  Mediastinal blood pool activity: SUV max 2.5  Liver activity: SUV max NA   NECK: Left supraclavicular hypermetabolic nodes including up to 9 mm  and a S.U.V. max of 5.8 on 84/3.  Incidental CT findings: Left carotid atherosclerosis. Expected  cerebral atrophy for age.   CHEST: Left chest wall recurrence, in the setting of prior bilateral  mastectomy. Example lateral left chest wall nodule at 1.6 cm and a  S.U.V. max of 13.6 on 123/3. Diffuse more cephalad left pectoral  muscular hypermetabolism including at a S.U.V. max of 9.8 on 104/3.  Left axillary and subpectoral nodal metastasis. Example subpectoral  node of 1.5 cm and a S.U.V. max of 10.1 on 110/3.  Incidental CT findings: Cardiomegaly. Aortic and coronary artery  calcification. Right Port-A-Cath tip high right atrium. Bibasilar  atelectasis.   ABDOMEN/PELVIS: Extensive, bilateral hepatic metastasis. Index  inferior right hepatic lobe mass with central necrosis measures 5.2  x 7.5 cm and a S.U.V. max of 10.6 on 183/3.  Left adrenal hypermetabolism with concurrent thickening at a S.U.V.  max of 4.1 on 166/3.  Incidental CT findings: Tiny hiatal hernia. Punctate right renal  collecting system calculus. Concurrent hepatic cysts. Normal  noncontrast appearance of the pancreas, gallbladder, right adrenal  gland, left kidney, uterus. Pelvic floor laxity.  SKELETON: Widespread osseous metastasis. index left iliac lesion  measures 1.8 cm and a S.U.V. max of 11.1 on 220/3. Sternal body  lesion measures a S.U.V. max of 9.7, including on 137/7.  Incidental CT findings: Remote trauma versus severe degenerative  changes about the right glenohumeral joint. Left greater than right  hip osteoarthritis.   IMPRESSION:  1. Widespread recurrent/metastatic disease in this patient who is  status post bilateral mastectomy. Left chest wall recurrence with  left axillary/subpectoral,  supraclavicular nodal metastasis.  2. Hepatic, left adrenal, and widespread osseous metastasis.  3. Incidental findings, including: Right nephrolithiasis. Tiny  hiatal hernia.    I, Rita Ohara, am acting as scribe for [name], MD  I have reviewed this report as typed by the medical scribe, and it is complete and accurate.

## 2021-05-29 ENCOUNTER — Other Ambulatory Visit: Payer: Self-pay | Admitting: Oncology

## 2021-05-29 NOTE — Progress Notes (Signed)
ON [facility] - Breast  No Change  Continue With Treatment as Ordered.  Original Decision Date/Time: 03/28/2019 17:37     A cycle is every 21 days:     Trastuzumab-[id number]   **Always confirm dose/schedule in your pharmacy ordering system**  Patient Characteristics: Post-Neoadjuvant Therapy and Resection, HER2 Positive, ER Negative/Unknown, No Residual Disease, Adjuvant Targeted Therapy After Neoadjuvant Chemo/Targeted Therapy Therapeutic Status: Post-Neoadjuvant Therapy and Resection ER Status: Negative (-) HER2 Status: Positive (+) PR Status: Negative (-) Residual Invasive Disease Post-Neoadjuvant Therapy<= No Intent of Therapy: Curative Intent, Discussed with Patient

## 2021-06-01 ENCOUNTER — Other Ambulatory Visit: Payer: Self-pay | Admitting: Oncology

## 2021-06-01 ENCOUNTER — Encounter: Payer: Self-pay | Admitting: Oncology

## 2021-06-02 ENCOUNTER — Encounter: Payer: Self-pay | Admitting: Oncology

## 2021-06-02 ENCOUNTER — Telehealth: Payer: Self-pay

## 2021-06-02 ENCOUNTER — Other Ambulatory Visit: Payer: Self-pay

## 2021-06-02 NOTE — Progress Notes (Signed)
Jasmin Lewis  [address] [place],  [place]  [address] [phone]  Clinic Day:  06/03/2021  Referring physician: [name], MD  This document serves as a record of services personally performed by Jasmin Poisson, MD. It was created on their behalf by Jasmin [facility], a trained medical scribe. The creation of this record is based on the scribe's personal observations and the provider's statements to them.  ASSESSMENT & PLAN:   Stage IIB (T2c N1 M0) HER2 receptor positive left breast cancer, diagnosed in June 2020. This was ER/PR negative. This was treated with neoadjuvant Kadcyla/Perjeta and left mastectomy, with a complete response in breast and nodes. She had only received two doses of adjuvant Herceptin before being discontinued in December 2020.  History of stage II colon cancer, diagnosed in May 2007. This was treated with surgical resection and adjuvant chemotherapy with Xeloda. Colonoscopy from 2013 revealed 2 polyps which were resected and chronic diverticulosis.  Liver metastases discovered 04/17/21.  Numerous heterogeneously enhancing, internally necrotic lesions in the right hepatic lobe, consistent with metastatic disease. Largest lesion measures up to 6.7 cm in hepatic segment VI, and there are at least 6 lesions. We now have biopsy proven HER2 positive recurrent breast cancer with ER/PR negative. She is scheduled for her 1st dose of trastuzumab today.  Bone metastases discovered 04/17/21. Enhancing osseous lesions in the L2 vertebral body, both iliac bones, and in the sacrum, highly concerning for osseous metastases. She received her 1st dose of zoledronic acid on January 16th.  Left supraclavicular lymph node measuring 2-3 cm. Also involvement of subpectoral node and chest wall skin.  Lymphedema of the left upper extremity. She has a sleeve in place, but this is causing severe irritation. She continues to follow with Jasmin Lewis, the lymphedema  specialist.   Hypercalcemia, improved following zoledronic acid on January 16th.  Dehydration. We are arranging for IV fluids today.   She will proceed with her 1st cycle of trastruzumab today and we will plan to administer IV fluids as well.  I discussed the treatment plan in detail with her and her husband. She can stop at any time if she has unacceptable toxicities, but I am optimistic that she will respond. She will need to have regular zoledronic acid for her bone mets and hypercalcemia. We will bring her back in 10 days with CBC and CMP for repeat evaluation and possible IV fluids. We will also see her back in 3 weeks with CBC and CMP prior to a 2nd cycle of trastuzumab. We plan to repeat scans after 3 months, but we will likely be able to tell how she is responding on physical exam. She and her husband understand and agree with this plan of care. I have answered their questions and she knows to call with any concerns.    I provided 20 minutes of face-to-face time during this this encounter and > 50% was spent counseling as documented under my assessment and plan.    Jasmin Kaplan, MD [facility] [address] [place] [address] Dept: [phone] Dept Fax: [phone]   CHIEF COMPLAINT:  CC: Stage IIB HER2 receptor positive left breast cancer with evidence of metastatic disease  Current Treatment:  Diagnostics    HISTORY OF PRESENT ILLNESS:  Jasmin Lewis is a 88 y.o. female who presents for a transfer of care for there continued evaluation and management of stage IIB left breast cancer. She  was originally diagnosed in June 2020 when she was able to palpate a mass of the left breast. Mammogram and ultrasound from May 2020 revealed a 2.9 cm irregular mass in the upper outer left breast as well as two adjacent abnormal left axillary lymph nodes. Biopsy was obtained on June 3rd and revealed invasive ductal  carcinoma and DCIS with lymphovascular invasion. Left axillary lymph node was positive for invasive ductal carcinoma. HER2 was positive 3+. Estrogen and progesterone receptors were both negative at 0%, and Ki67 was 70%. Breast MRI from July revealed a 3.3 cm mass of the lateral portion of the left breast as well as significant non mass enhancement surrounding this mass and extending anteriorly into the nipple base, with largest diameter in the anterior to posterior axis, measuring 7.2 centimeters. Three enlarged left axillary lymph nodes. Right breast was negative.  PET imaging from July revealed no evidence of additional metastatic involvement. She received neoadjuvant Kadcyla/Perjeta for 6 cycles. Repeat breast MRI from October revealed complete resolution of the enhancing mass and associated non mass enhancement within the left breast. Previously identified left axillary lymphadenopathy was no longer identified. She underwent left mastectomy and targeted lymph node dissection in November 2020 and final pathology from this procedure confirmed no evidence of malignancy, for a complete response. Three sentinel lymph nodes were negative for malignancy (0/3). She continued maintenance Herceptin injections for two more doses, but this was discontinued in December 2020 as she refused further ECHO scans.   Of note, she also has a history of stage II colon cancer, diagnosed in May 2007. Preop CEA was 5.2. She underwent surgical resection and surgical pathology revealed a 5.9 x 3.8 x 1.2 cm moderate to well-differentiated adenocarcinoma penetrating the muscular wall. Forty lymph nodes negative. No vascular or lymphatic invasion. Multiple diverticula with microabscess formation and inflammation. Carcinoma present in at least 1 diverticulum. She received adjuvant chemotherapy with Xeloda for 9 months. Colonoscopy from August 2013 revealed 2 polyps which were resected and chronic diverticulosis.  Jasmin Lewis states that she  presented to [facility] ER on December 9th due to constipation and back pain with spasms. CT abdomen/pelvis from December 9th revealed new lytic lesion involving the L2 vertebral body with minimal cortical breakthrough superiorly, but with preservation of vertebral body height, concerning for metastatic disease. There are several indeterminate hepatic lesions. In addition to the lesions, there is an additional subtle 1.5 cm hypodensity in the hepatic dome. MRI abdomen from January 3rd revealed numerous heterogeneously enhancing, internally necrotic lesions in the right hepatic lobe, highly concerning for metastatic disease. Largest lesion measures up to 6.7 cm in hepatic segment VI. Enhancing osseous lesions in the L2 vertebral body, both iliac bones, and in the sacrum, highly concerning for osseous metastases. She has chronic lymphedema of the left upper extremity since November 2022 after an accident, for which she wears a compression sleeve. However, her skin is reacting to the plastic cuff of the sleeve, even though she has tried turning it inside out. She is limited in her mobility of the right upper extremity due to prior incident of dislocated shoulder. I met with her on January 6th and reviewed the imaging. I recommended further evaluation before finalizing a treatment plan. We scheduled a PET scan, MRI brain and spine, and ECHO. We also referred her to Dr. Lilia Pro for node biopsy and port placement. When he saw her, he found a nodule in the upper left chest wall which was suspicious and biopsied this rather than the left supraclavicular  node. She underwent biopsy of a chest wall nodule on January 12th with Dr. Lilia Pro. Pathology confirmed carcinoma, 5 mm nodule, involving dermis, subcutaneous tissue and lymphovascular space, compatible with metastatic breast carcinoma. CK7 positive, estrogen staining is mostly negative but there is some faint positivity. HER2 was positive 3+. Estrogen and progesterone receptors  were negative. Ki67 was 70%.   INTERVAL HISTORY:  I have reviewed her chart and materials related to her cancer extensively and collaborated history with the patient. Summary of oncologic history is as follows: Oncology History Overview Note  Cancer Staging Malignant neoplasm of upper-outer quadrant of left breast in female, estrogen receptor negative ([place]) Staging form: Breast, AJCC 8th Edition - Clinical stage from 10/11/2018: Stage IIB (cT2, cN1, cM0, G3, ER-, PR-, HER2+) - Signed by Truitt Merle, MD on 11/02/2018 - Clinical: No stage assigned - Unsigned    History of colon cancer, stage II  09/2005 Initial Diagnosis   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 Findings were a 5.9 x 3.8 x 1.2 cm moderate to well-differentiated adenocarcinoma penetrating the muscular wall. Forty lymph nodes negative. No vascular or lymphatic invasion. Multiple diverticula with microabscess formation and inflammation. Carcinoma present in at least 1 diverticulum. Preop CEA 5.2 with lab normal range 0 to 2.5. Preop CT scan with no obvious additional pathology.    2007 -  Chemotherapy   She received oral Xeloda chemotherapy as an adjuvant. She declined treatment on a clinical trial.    11/12/2011 Initial Diagnosis   [name], stage II   12/09/2011 Procedure   Followup colonoscopy done on 12/09/2011. She was found to have 2 polyps which were removed. Chronic diverticulosis.     Malignant neoplasm of upper-outer quadrant of left breast in female, estrogen receptor negative ([place])  09/26/2018 Mammogram   Mammogram/US of left breast 09/26/18 IMPRESSION:  1. 2.9cm irregular mass in the upper outer left breast corresponds to the palpable abnormality. This is highly suspicious for breast carcinoma.  2. Two adjacent abnormal left axillary  Lymph nodes suspicious for metastatic adenopathy. There is a third borderline  abnormal left axillary LN with a cortex thickened to 20m.  3. Benign right breast cyst. No evidence of right breast malignancy.    10/11/2018 Cancer Staging   Staging form: Breast, AJCC 8th Edition - Clinical stage from 10/11/2018: Stage IIB (cT2, cN1, cM0, G3, ER-, PR-, HER2+) - Signed by [name] MD on 11/02/2018    10/11/2018 Initial Biopsy   Diagnosis 10/11/18 1. Breast, left, needle core biopsy, upper outer left 2 o'clock - INVASIVE DUCTAL CARCINOMA. - DUCTAL CARCINOMA IN SITU. - LYMPHOVASCULAR INVASION IS IDENTIFIED. - SEE COMMENT. 2. Lymph node, needle/core biopsy, left axilla - INVASIVE DUCTAL CARCINOMA. - SEE COMMENT.   10/11/2018 Receptors her2   The tumor cells are POSITIVE for Her2 (3+). Estrogen Receptor: 0%, NEGATIVE Progesterone Receptor: 0%, NEGATIVE Proliferation Marker Ki67: 70%   11/02/2018 Initial Diagnosis   Malignant neoplasm of upper-outer quadrant of left breast in female, estrogen receptor negative ([place]   11/15/2018 Breast MRI   MRI breast 11/15/18  IMPRESSION: 1. 3.3 centimeter mass in the LATERAL portion of the LEFT breast consistent with known malignancy. 2. There is significant non mass enhancement surrounding this mass and extending anteriorly into the nipple base, with largest diameter in the anterior to posterior axis, measuring 7.2 centimeters. 3. If the patient would consider breast conservation, additional MR guided  core biopsies are recommended. Consider biopsy of the inferior and anterior extent of the non mass enhancement to document extent of disease. 4. Three enlarged LEFT axillary lymph nodes. 5. RIGHT breast is negative.   11/15/2018 PET scan   PET 11/15/18 IMPRESSION: Hypermetabolic left breast lesion compatible with known primary. Hypermetabolic left axillary lymph nodes are consistent with metastatic disease.   No evidence for additional hypermetabolic metastatic involvement in the neck, chest, abdomen, or pelvis.   11/17/2018 -  03/01/2019 Chemotherapy   Neo-adjuvant Kadcyla and perjeta q3weeks for 6 cycles starting 11/17/18. Stopped before surgery    11/29/2018 Pathology Results   Diagnosis 1. Breast, left, needle core biopsy, inferior anterior (cylinder clip) - INVASIVE DUCTAL CARCINOMA. - DUCTAL CARCINOMA IN SITU. - LYMPHOVASCULAR INVASION IS IDENTIFIED. - SEE COMMENT. 2. Breast, left, needle core biopsy, central posterior (barbell clip) - INVASIVE DUCTAL CARCINOMA. - LYMPHOVASCULAR INVASION IS IDENTIFIED.   02/12/2019 Breast MRI   IMPRESSION: 1. Complete resolution of previously identified enhancing mass and associated non mass enhancement within the left breast. This is consistent with excellent response to chemotherapy. No residual or suspicious findings are identified. 2. No MRI evidence of malignancy on the right. 3. Previously identified left axillary lymphadenopathy not definitively seen on today's study. However, this may be due to decreased field-of-view compared to prior study.     03/14/2019 Cancer Staging   Staging form: Breast, AJCC 8th Edition - Pathologic stage from 03/14/2019: pT0, pN0, cM0, GX, ER: Unknown, PR: Unknown, HER2: Not Assessed - Signed by Truitt Merle, MD on 03/28/2019    03/14/2019 Surgery   LEFT MASTECTOMY WITH TARGETED LYMPH NODE DISSECTION and Left Axillary Sentinel Lymph  Node Biopsy by Dr [name] 03/14/19    03/14/2019 Pathology Results   FINAL MICROSCOPIC DIAGNOSIS:   A. LYMPH NODE, LEFT, SENTINEL, BIOPSY:  - There is no evidence of carcinoma in 1 of 1 lymph node (0/1).   B. BREAST, LEFT, MASTECTOMY:  - Benign breast parenchyma with treatment-related changes.  - There is no evidence of malignancy.  - See oncology table below.   C. LYMPH NODE, LEFT #1, SENTINEL, BIOPSY:  - There is no evidence of carcinoma in 1 of 1 lymph node (0/1).   D. LYMPH NODE, LEFT #2, SENTINEL, BIOPSY:  - There is no evidence of carcinoma in 1 of 1 lymph node (0/1).     04/04/2019 -  04/25/2019 Chemotherapy   Maintenance Herceptin injections every 3 weeks starting 04/03/19 to complete 1 year of treatment that was started in 11/2018. Stopped after 2nd dose as she will not be repeating Echos.    06/03/2021 -  Chemotherapy   Patient is on Treatment Plan : BREAST Trastuzumab q21d X 11 Cycles     Liver metastases ([place])  04/17/2021 Imaging   CT ABDOMEN AND PELVIS WITH CONTRAST: -New lytic lesion involving the L2 vertebral body (6:72, 2:26) with minimal cortical breakthrough superiorly, but with preservation of vertebral body height, concerning for metastatic disease.  -There are several indeterminate hepatic lesions as described above. In addition to the lesions described above, there is an additional subtle 1.5 cm hypodensity in the hepatic dome (2:7). Given clinical history and presence of a new L2 lesion, leading differential consideration is metastatic disease. Recommend further evaluation with MRI of the abdomen with and without contrast.   05/12/2021 Imaging   MRI ABDOMEN WITH AND WITHOUT CONTRAST: Numerous heterogeneously enhancing, internally necrotic lesions in the right hepatic lobe, highly concerning for metastatic disease. Largest lesion measures up to  6.7 cm in hepatic segment VI.   Enhancing osseous lesions in the L2 vertebral body, both iliac bones, and in the sacrum, highly concerning for osseous metastases.   05/15/2021 Initial Diagnosis   Liver metastases ([place])   05/27/2021 PET scan   1. Widespread recurrent/metastatic disease in this patient who is  status post bilateral mastectomy. Left chest wall recurrence with  left axillary/subpectoral, supraclavicular nodal metastasis.  2. Hepatic, left adrenal, and widespread osseous metastasis.  3. Incidental findings, including: Right nephrolithiasis. Tiny  hiatal hernia.    05/27/2021 Imaging   MRI LUMBAR AND THORACIC SPINE: Thoracic spine:  Osseous metastatic disease involving each level. The most dramatic deposits  are at T4 and T6 where there is extraosseous/epidural tumor. No cord compression but there is foraminal impingement on the left at T6-7 and on the right at T3-4. Early dorsal epidural tumor at the level of T10 and T3.   Lumbar spine:  1. Widespread osseous metastatic disease with largest deposit  replacing the L2 body where there is a compression fracture with  mild height loss. Also at this level is early epidural tumor  extension likely affecting both L2-3 foramina.  2. 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    05/27/2021 Imaging   MRI HEAD WITH AND WITHOUT CONTRAST: Negative for metastatic disease to the brain or calvarium.   06/03/2021 -  Chemotherapy   Patient is on Treatment Plan : BREAST Trastuzumab q21d X 11 Cycles     Bone metastases ([place])  04/17/2021 Imaging   CT ABDOMEN AND PELVIS WITH CONTRAST: -New lytic lesion involving the L2 vertebral body (6:72, 2:26) with minimal cortical breakthrough superiorly, but with preservation of vertebral body height, concerning for metastatic disease.  -There are several indeterminate hepatic lesions as described above. In addition to the lesions described above, there is an additional subtle 1.5 cm hypodensity in the hepatic dome (2:7). Given clinical history and presence of a new L2 lesion, leading differential consideration is metastatic disease. Recommend further evaluation with MRI of the abdomen with and without contrast.   05/12/2021 Imaging   MRI ABDOMEN WITH AND WITHOUT CONTRAST: Numerous heterogeneously enhancing, internally necrotic lesions in the right hepatic lobe, highly concerning for metastatic disease. Largest lesion measures up to 6.7 cm in hepatic segment VI.   Enhancing osseous lesions in the L2 vertebral body, both iliac bones, and in the sacrum, highly concerning for osseous metastases.   05/15/2021 Initial Diagnosis   Bone metastases ([place])   05/27/2021 PET scan   1. Widespread  recurrent/metastatic disease in this patient who is  status post bilateral mastectomy. Left chest wall recurrence with  left axillary/subpectoral, supraclavicular nodal metastasis.  2. Hepatic, left adrenal, and widespread osseous metastasis.  3. Incidental findings, including: Right nephrolithiasis. Tiny  hiatal hernia.    05/27/2021 Imaging   MRI LUMBAR AND THORACIC SPINE: Thoracic spine:  Osseous metastatic disease involving each level. The most dramatic deposits are at T4 and T6 where there is extraosseous/epidural tumor. No cord compression but there is foraminal impingement on the left at T6-7 and on the right at T3-4. Early dorsal epidural tumor at the level of T10 and T3.   Lumbar spine:  1. Widespread osseous metastatic disease with largest deposit  replacing the L2 body where there is a compression fracture with  mild height loss. Also at this level is early epidural tumor  extension likely affecting both L2-3 foramina.  2. 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  05/27/2021 Imaging   MRI HEAD WITH AND WITHOUT CONTRAST: Negative for metastatic disease to the brain or calvarium.   06/03/2021 -  Chemotherapy   Patient is on Treatment Plan : BREAST Trastuzumab q21d X 11 Cycles      Jasmin Lewis is here for follow up prior to starting HER2 directed therapy with trastuzumab. She received Zometa on January 16th for severe hypercalcemia of 12.6 and tolerated without difficulty. PET imaging from January 18th revealed widespread recurrent/metastatic disease with left chest wall recurrence with left axillary/subpectoral, supraclavicular nodal metastasis. Hepatic, left adrenal, and widespread osseous metastasis were also seen. MRI head from January 20th was negative. ECHO revealed a normal EF between 60 - 65 %. MRI of the thoracic and lumbar spine revealed osseous metastatic disease involving each level. The most dramatic deposits of the thoracic spine are at T4 and  T6 where there is extraosseous/epidural tumor. No cord compression but there is foraminal impingement on the left at T6-7 and on the right at T3-4. Early dorsal epidural tumor at the level of T10 and T3. Widespread osseous metastatic disease with largest deposit of the lumbar spine replacing the L2 body where there is a compression fracture with mild height loss. Also at this level is early epidural tumor extension likely affecting both L2-3 foramina. Lumbar spine degeneration with scoliosis and multilevel impingement. The pathology from her chest wall biopsy revealed HER2 positive at 3+ with ER/PR negative. She is scheduled for her 1st dose of trastuzumab today. She has had her port placed. She continues to have problems with the lymphedema of the left upper extremity. She has had some constipation, and recently started [facility] yesterday, and I advised she take this twice daily. I advised that she push fluids as well and we will administer 1 L of IV fluids today. She received IV fluids last week due to dehydration and weakness. She may use the milk of magnesia or fleet enema if needed. Blood counts and chemistries are unremarkable except for elevated liver transaminases, stable, and an elevated alkaline phosphatase of 294, fairly stable. Calcium has improved from 12.6 to 9.1. Her  appetite is good, and her weight is stable since her last visit.  She denies fever, chills or other signs of infection.  She denies nausea, vomiting or abdominal pain.  She denies sore throat, cough, dyspnea, or chest pain.  HISTORY:   Allergies: No Known Allergies  Current Medications: Current Outpatient Medications  Medication Sig Dispense Refill   calcium citrate-vitamin D (CITRACAL+D) 315-200 MG-UNIT per tablet Take 1 tablet by mouth daily.     fish oil-omega-3 fatty acids 1000 MG capsule Take 1 g by mouth daily.     oxyCODONE (OXY IR/ROXICODONE) 5 MG immediate release tablet Take 1 tablet (5 mg total) by mouth every 4  (four) hours as needed. 100 tablet 0   polyethylene glycol (MIRALAX / GLYCOLAX) 17 g packet Take 17 g by mouth as needed. constipation     senna (SENOKOT) 8.6 MG TABS tablet Take 1 tablet by mouth 2 (two) times daily.     No current facility-administered medications for this visit.    REVIEW OF SYSTEMS:  Review of Systems  Constitutional: Negative.  Negative for appetite change, chills, fatigue, fever and unexpected weight change.  HENT:  Negative.    Eyes: Negative.   Respiratory: Negative.  Negative for chest tightness, cough, hemoptysis, shortness of breath and wheezing.   Cardiovascular: Negative.  Negative for chest pain, leg swelling and palpitations.  Gastrointestinal:  Positive  for constipation. Negative for abdominal distention, abdominal pain, blood in stool, diarrhea, nausea and vomiting.  Endocrine: Negative.   Genitourinary: Negative.  Negative for difficulty urinating, dysuria, frequency and hematuria.   Musculoskeletal:  Positive for arthralgias (generalized). Negative for back pain, flank pain, gait problem and myalgias.       Lymphedema of the left upper extremity  Skin: Negative.   Neurological: Negative.  Negative for dizziness, extremity weakness, gait problem, headaches, light-headedness, numbness, seizures and speech difficulty.  Hematological: Negative.   Psychiatric/Behavioral: Negative.  Negative for depression and sleep disturbance. The patient is not nervous/anxious.      VITALS:  Blood pressure (!) 182/73, pulse 74, temperature 98 F (36.7 C), temperature source Oral, resp. rate 20, height 5' 1"  (1.549 m), weight 119 lb 3.2 oz (54.1 kg), SpO2 97 %.  Wt Readings from Last 3 Encounters:  06/03/21 119 lb 3.2 oz (54.1 kg)  05/25/21 119 lb (54 kg)  05/25/21 119 lb 11.2 oz (54.3 kg)    Body mass index is 22.52 kg/m.  Performance status (ECOG): 1 - Symptomatic but completely ambulatory  PHYSICAL EXAM:  Physical Exam Constitutional:      General: She is  not in acute distress.    Appearance: Normal appearance. She is normal weight.  HENT:     Head: Normocephalic and atraumatic.  Eyes:     General: No scleral icterus.    Extraocular Movements: Extraocular movements intact.     Conjunctiva/sclera: Conjunctivae normal.     Pupils: Pupils are equal, round, and reactive to light.  Cardiovascular:     Rate and Rhythm: Normal rate and regular rhythm.     Pulses: Normal pulses.     Heart sounds: Normal heart sounds. No murmur heard.   No friction rub. No gallop.  Pulmonary:     Effort: Pulmonary effort is normal. No respiratory distress.     Breath sounds: Normal breath sounds.  Chest:     Comments: Large hard mass in the left supraclavicular fossa measuring at least 3 cm. Healing incision in the upper outer anterior left chest. Generalized swelling of the entire area of the left pectoralis muscle. Abdominal:     General: Bowel sounds are normal. There is no distension.     Palpations: Abdomen is soft. There is hepatomegaly (firm, 3 finger breadths below the right costal margin). There is no splenomegaly or mass.     Tenderness: There is no abdominal tenderness.  Musculoskeletal:        General: Normal range of motion.     Cervical back: Normal range of motion and neck supple.     Right lower leg: No edema.     Left lower leg: No edema.     Comments: Lymphedema 1+ of the left upper extremity.   Lymphadenopathy:     Cervical: No cervical adenopathy.  Skin:    General: Skin is warm and dry.     Comments: Skin turgor decreased  Neurological:     General: No focal deficit present.     Mental Status: She is alert and oriented to person, place, and time. Mental status is at baseline.  Psychiatric:        Mood and Affect: Mood normal.        Behavior: Behavior normal.        Thought Content: Thought content normal.        Judgment: Judgment normal.    LABS:   CBC Latest Ref Rng & Units 06/03/2021 05/25/2021 05/15/2021  WBC - 6.1 8.5 8.8   Hemoglobin 12.0 - 16.0 13.9 14.4 14.6  Hematocrit 36 - 46 42 44 43  Platelets 150 - 399 237 197 220   CMP Latest Ref Rng & Units 06/03/2021 05/25/2021 05/15/2021  Glucose 70 - 99 mg/dL - - -  BUN 4 - 21 9 18 16   Creatinine 0.5 - 1.1 0.7 0.9 0.7  Sodium 137 - 147 136(A) 142 140  Potassium 3.4 - 5.3 4.1 3.9 4.2  Chloride 99 - 108 102 104 106  CO2 13 - 22 26(A) 32(A) 27(A)  Calcium 8.7 - 10.7 9.1 12.6(A) 11.3(A)  Total Protein 6.5 - 8.1 g/dL - - -  Total Bilirubin 0.3 - 1.2 mg/dL - - -  Alkaline Phos 25 - 125 294(A) 269(A) 252(A)  AST 13 - 35 148(A) 137(A) 97(A)  ALT 7 - 35 43(A) 43(A) 38(A)     No results found for: CEA1 / No results found for: CEA1  Lab Results  Component Value Date   LDH 186 11/12/2011   LDH 187 09/08/2010   LDH 171 09/09/2009    STUDIES:  No results found.    EXAM: 05/27/2021 NUCLEAR MEDICINE PET SKULL BASE TO THIGH   TECHNIQUE:  15.4 mCi F-18 FDG was injected intravenously. Full-ring PET imaging  was performed from the skull base to thigh after the radiotracer. CT  data was obtained and used for attenuation correction and anatomic  localization.  Fasting blood glucose: 125 mg/dl   COMPARISON: PET 11/15/2018 from [facility]   FINDINGS:  Mediastinal blood pool activity: SUV max 2.5  Liver activity: SUV max NA   NECK: Left supraclavicular hypermetabolic nodes including up to 9 mm  and a S.U.V. max of 5.8 on 84/3.  Incidental CT findings: Left carotid atherosclerosis. Expected  cerebral atrophy for age.   CHEST: Left chest wall recurrence, in the setting of prior bilateral  mastectomy. Example lateral left chest wall nodule at 1.6 cm and a  S.U.V. max of 13.6 on 123/3. Diffuse more cephalad left pectoral  muscular hypermetabolism including at a S.U.V. max of 9.8 on 104/3.  Left axillary and subpectoral nodal metastasis. Example subpectoral  node of 1.5 cm and a S.U.V. max of 10.1 on 110/3.  Incidental CT findings: Cardiomegaly. Aortic and  coronary artery  calcification. Right Port-A-Cath tip high right atrium. Bibasilar  atelectasis.  ABDOMEN/PELVIS: Extensive, bilateral hepatic metastasis. Index  inferior right hepatic lobe mass with central necrosis measures 5.2  x 7.5 cm and a S.U.V. max of 10.6 on 183/3.  Left adrenal hypermetabolism with concurrent thickening at a S.U.V.  max of 4.1 on 166/3.  Incidental CT findings: Tiny hiatal hernia. Punctate right renal  collecting system calculus. Concurrent hepatic cysts. Normal  noncontrast appearance of the pancreas, gallbladder, right adrenal  gland, left kidney, uterus. Pelvic floor laxity.  SKELETON: Widespread osseous metastasis. index left iliac lesion  measures 1.8 cm and a S.U.V. max of 11.1 on 220/3. Sternal body  lesion measures a S.U.V. max of 9.7, including on 137/7.  Incidental CT findings: Remote trauma versus severe degenerative  changes about the right glenohumeral joint. Left greater than right  hip osteoarthritis.   IMPRESSION:  1. Widespread recurrent/metastatic disease in this patient who is  status post bilateral mastectomy. Left chest wall recurrence with  left axillary/subpectoral, supraclavicular nodal metastasis.  2. Hepatic, left adrenal, and widespread osseous metastasis.  3. Incidental findings, including: Right nephrolithiasis. Tiny  hiatal hernia.    EXAM: 05/29/2021 MRI THORACIC  AND LUMBAR SPINE WITHOUT AND WITH CONTRAST   TECHNIQUE:  Multiplanar and multiecho pulse sequences of the thoracic and lumbar  spine were obtained without and with intravenous contrast.   CONTRAST: 5 cc of Gadavist intravenous   COMPARISON: None.   FINDINGS:  MRI THORACIC SPINE FINDINGS  Alignment: Mild scoliotic curvature. Mild T2-3 degenerative  anterolisthesis.  Vertebrae: Widespread osseous metastatic disease. On the scan  counting sequence there is metastases in the C5 and C7 vertebral  bodies. On dedicated imaging there are metastases at T2 through  T12.  The most dramatic metastatic deposits are at the T4 right posterior  elements extending into the adjacent soft tissues and right T3-4  foramen. Confluent metastasis at T6-7, left eccentric at the body  and posterior elements with epidural tumor infiltrating the left  T6-7 foramen and left-sided canal.  T10 spinous process metastasis likely extending into the dorsal  epidural space.  Left T7 transverse process involvement extending into the adjacent  rib and subpleural space.  No compression fracture.  Cord: No cord impingement or visible intrathecal metastasis.  Paraspinal and other soft tissues: Recently evaluated by PET CT.  Disc levels:  Ordinary degenerative changes for age. No degenerative cord  impingement.   MRI LUMBAR SPINE FINDINGS  Segmentation: 5 lumbar type vertebrae  Alignment: Degenerative levoscoliosis.  Vertebrae: Metastatic deposits are seen at each lumbar level sparing  L4, and also at the S1 and S2 levels, and in the partially covered  bony pelvis. The largest deposit diffusely involves the L2 body with  compression fracture and mild height loss. Degree of tumor limits  aging of the fracture. There is epidural tumor growth to early  degree at the level of L2, likely affecting the L2-3 foramina on  both sides.  Conus medullaris: Extends to the L1-2 level and appears normal.  Paraspinal and other soft tissues: Osseous metastatic disease as  described on prior dedicated imaging  Disc levels:  Diffuse degenerative disc collapse and bulging with endplate and  facet spurring. Moderate to advanced spinal stenosis at L1-2 to  L3-4.  Right foraminal impingement at T12-L1 to L3-4 and on the left at  L2-3 and L3-4.   IMPRESSION:  Thoracic spine:  Osseous metastatic disease involving each level. The most dramatic  deposits are at T4 and T6 where there is extraosseous/epidural  tumor. No cord compression but there is foraminal impingement on the  left at T6-7 and  on the right at T3-4. Early dorsal epidural tumor  at the level of T10 and T3.   Lumbar spine:  1. Widespread osseous metastatic disease with largest deposit  replacing the L2 body where there is a compression fracture with  mild height loss. Also at this level is early epidural tumor  extension likely affecting both L2-3 foramina.  2. 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    EXAM: 05/29/2021 MRI HEAD WITHOUT AND WITH CONTRAST   TECHNIQUE:  Multiplanar, multiecho pulse sequences of the brain and surrounding  structures were obtained without and with intravenous contrast.   CONTRAST: 5 cc of Gadavist intravenous   COMPARISON: None.   FINDINGS:  Brain: No evidence of metastatic disease to the brain or meninges.  Scattered FLAIR hyperintensities in the hemispheric white matter  attributed to chronic small vessel ischemia. Age normal brain  volume. No incidental infarct, hemorrhage, hydrocephalus, or  collection  Vascular: Normal flow voids and vascular enhancement  Skull and upper cervical spine: Normal marrow signal  Sinuses/Orbits: Negative  IMPRESSION:  Negative for metastatic disease to the brain or calvarium.    I, Rita Ohara, am acting as scribe for Jasmin Kaplan, MD  I have reviewed this report as typed by the medical scribe, and it is complete and accurate.

## 2021-06-02 NOTE — Telephone Encounter (Addendum)
@  79 - Pt doesn't feel like she needs IVF today. I will send Dr Hinton Rao update.   RE: Left hand/fingers weak, losing strength in them - shes is worried about it. Received: Today [name], MD  Dairl Ponder, RN; Phy, Beverly Gust, [facility] We can give her IV fluids tomorrow while she is here, does she need some today?I will be going over the rest of the test results tomorrow & then plan Herceptin, so they moved appt earlier to have time    Pt is scheduled to see for labs, appt w/you and trastuzumab tomorrow. She called because she is worried about a couple of things. First, she is worried that she maybe a little dehydrated because she had to take laxatives to get her bowels to move. I told her that if she was dehydrated, that we could give her IVF. It would not delay her treatment. I did encouraged her to start senna BID, as she is taking the oxycodone q 4hr for hip & spine pain. Second, she states, "I'm worried about my left hand and fingers. I am losing strength in it. I cant write. It feels weak and numb and no one has mentioned it or done anything about it. I don't know if there might be a clot". Pt denies redness, discoloration and swelling in left hand. I told pt I would make you aware, so you could check her tomorrow.  I sent the above message to Dr Hinton Rao @ [id number]-awc

## 2021-06-03 ENCOUNTER — Telehealth: Payer: Self-pay | Admitting: Oncology

## 2021-06-03 ENCOUNTER — Inpatient Hospital Stay (INDEPENDENT_AMBULATORY_CARE_PROVIDER_SITE_OTHER): Payer: Medicare Other | Admitting: Oncology

## 2021-06-03 ENCOUNTER — Other Ambulatory Visit: Payer: Medicare Other

## 2021-06-03 ENCOUNTER — Other Ambulatory Visit: Payer: Self-pay | Admitting: Pharmacist

## 2021-06-03 ENCOUNTER — Encounter: Payer: Self-pay | Admitting: Oncology

## 2021-06-03 ENCOUNTER — Other Ambulatory Visit: Payer: Self-pay | Admitting: Oncology

## 2021-06-03 ENCOUNTER — Ambulatory Visit: Payer: Medicare Other | Admitting: Oncology

## 2021-06-03 ENCOUNTER — Other Ambulatory Visit: Payer: Self-pay

## 2021-06-03 ENCOUNTER — Inpatient Hospital Stay: Payer: Medicare Other

## 2021-06-03 VITALS — BP 173/69 | HR 74 | Temp 98.0°F | Resp 18

## 2021-06-03 VITALS — BP 182/73 | HR 74 | Temp 98.0°F | Resp 20 | Ht 61.0 in | Wt 119.2 lb

## 2021-06-03 DIAGNOSIS — Z171 Estrogen receptor negative status [ER-]: Secondary | ICD-10-CM

## 2021-06-03 DIAGNOSIS — C787 Secondary malignant neoplasm of liver and intrahepatic bile duct: Secondary | ICD-10-CM | POA: Diagnosis not present

## 2021-06-03 DIAGNOSIS — E860 Dehydration: Secondary | ICD-10-CM

## 2021-06-03 DIAGNOSIS — C50412 Malignant neoplasm of upper-outer quadrant of left female breast: Secondary | ICD-10-CM

## 2021-06-03 DIAGNOSIS — Z5112 Encounter for antineoplastic immunotherapy: Secondary | ICD-10-CM | POA: Diagnosis not present

## 2021-06-03 DIAGNOSIS — C7951 Secondary malignant neoplasm of bone: Secondary | ICD-10-CM

## 2021-06-03 LAB — HEPATIC FUNCTION PANEL
ALT: 43 — AB (ref 7–35)
AST: 148 — AB (ref 13–35)
Alkaline Phosphatase: 294 — AB (ref 25–125)
Bilirubin, Total: 0.8

## 2021-06-03 LAB — BASIC METABOLIC PANEL
BUN: 9 (ref 4–21)
CO2: 26 — AB (ref 13–22)
Chloride: 102 (ref 99–108)
Creatinine: 0.7 (ref 0.5–1.1)
Glucose: 121
Potassium: 4.1 (ref 3.4–5.3)
Sodium: 136 — AB (ref 137–147)

## 2021-06-03 LAB — COMPREHENSIVE METABOLIC PANEL
Albumin: 4.3 (ref 3.5–5.0)
Calcium: 9.1 (ref 8.7–10.7)

## 2021-06-03 LAB — CBC: RBC: 4.45 (ref 3.87–5.11)

## 2021-06-03 LAB — CBC AND DIFFERENTIAL
HCT: 42 (ref 36–46)
Hemoglobin: 13.9 (ref 12.0–16.0)
Neutrophils Absolute: 4.33
Platelets: 237 (ref 150–399)
WBC: 6.1

## 2021-06-03 MED ORDER — SODIUM CHLORIDE 0.9 % IV SOLN: Freq: Once | INTRAVENOUS | Status: AC

## 2021-06-03 MED ORDER — TRASTUZUMAB-HYALURONIDASE-OYSK 600-10000 MG-UNT/5ML ~~LOC~~ SOLN
600.0000 mg | Freq: Once | SUBCUTANEOUS | Status: AC
  Administered 2021-06-03: 600 mg via SUBCUTANEOUS
  Filled 2021-06-03: qty 5

## 2021-06-03 MED ORDER — HEPARIN SOD (PORK) LOCK FLUSH 100 UNIT/ML IV SOLN
500.0000 [IU] | Freq: Once | INTRAVENOUS | Status: AC | PRN
  Administered 2021-06-03: 500 [IU]

## 2021-06-03 MED ORDER — DIPHENHYDRAMINE HCL 25 MG PO CAPS
25.0000 mg | ORAL_CAPSULE | Freq: Once | ORAL | Status: AC
  Administered 2021-06-03: 25 mg via ORAL
  Filled 2021-06-03: qty 1

## 2021-06-03 MED ORDER — ACETAMINOPHEN 325 MG PO TABS
650.0000 mg | ORAL_TABLET | Freq: Once | ORAL | Status: AC
  Administered 2021-06-03: 650 mg via ORAL
  Filled 2021-06-03: qty 2

## 2021-06-03 MED ORDER — SODIUM CHLORIDE 0.9% FLUSH
10.0000 mL | Freq: Once | INTRAVENOUS | Status: AC | PRN
  Administered 2021-06-03: 10 mL

## 2021-06-03 NOTE — Telephone Encounter (Signed)
Patient has been scheduled for follow-up visit per 1/125/23 los. Pt given an appt calendar with date and time.

## 2021-06-03 NOTE — Patient Instructions (Signed)
[facility]  Discharge Instructions: Thank you for choosing [facility] to provide your oncology and hematology care.  If you have a lab appointment with the [facility], please go directly to the [facility] and check in at the registration area.   Wear comfortable clothing and clothing appropriate for easy access to any Portacath or PICC line.   We strive to give you quality time with your provider. You may need to reschedule your appointment if you arrive late (15 or more minutes).  Arriving late affects you and other patients whose appointments are after yours.  Also, if you miss three or more appointments without notifying the office, you may be dismissed from the clinic at the providers discretion.      For prescription refill requests, have your pharmacy contact our office and allow 72 hours for refills to be completed.    Today you received the following chemotherapy and/or immunotherapy agents : trastuzumab   To help prevent nausea and vomiting after your treatment, we encourage you to take your nausea medication as directed.  BELOW ARE SYMPTOMS THAT SHOULD BE REPORTED IMMEDIATELY: *FEVER GREATER THAN 100.4 F (38 C) OR HIGHER *CHILLS OR SWEATING *NAUSEA AND VOMITING THAT IS NOT CONTROLLED WITH YOUR NAUSEA MEDICATION *UNUSUAL SHORTNESS OF BREATH *UNUSUAL BRUISING OR BLEEDING *URINARY PROBLEMS (pain or burning when urinating, or frequent urination) *BOWEL PROBLEMS (unusual diarrhea, constipation, pain near the anus) TENDERNESS IN MOUTH AND THROAT WITH OR WITHOUT PRESENCE OF ULCERS (sore throat, sores in mouth, or a toothache) UNUSUAL RASH, SWELLING OR PAIN  UNUSUAL VAGINAL DISCHARGE OR ITCHING   Items with * indicate a potential emergency and should be followed up as soon as possible or go to the Emergency Department if any problems should occur.  Please show the CHEMOTHERAPY ALERT CARD or IMMUNOTHERAPY ALERT CARD at check-in to the  Emergency Department and triage nurse.  Should you have questions after your visit or need to cancel or reschedule your appointment, please contact [facility]  Dept: [phone]  and follow the prompts.  Office hours are 8:00 a.m. to 4:30 p.m. Monday - Friday. Please note that voicemails left after 4:00 p.m. may not be returned until the following business day.  We are closed weekends and major holidays. You have access to a nurse at all times for urgent questions. Please call the main number to the clinic Dept: [phone] and follow the prompts.  For any non-urgent questions, you may also contact your provider using MyChart. We now offer e-Visits for anyone 18 and older to request care online for non-urgent symptoms. For details visit [email or web address].   Also download the MyChart app! Go to the app store, search "MyChart", open the app, select [facility], and log in with your MyChart username and password.  Due to Covid, a mask is required upon entering the hospital/clinic. If you do not have a mask, one will be given to you upon arrival. For doctor visits, patients may have 1 support person aged 23 or older with them. For treatment visits, patients cannot have anyone with them due to current Covid guidelines and our immunocompromised population.

## 2021-06-03 NOTE — Addendum Note (Signed)
Addended by: Juanetta Beets on: 06/03/2021 02:52 PM   Modules accepted: Orders

## 2021-06-04 ENCOUNTER — Other Ambulatory Visit: Payer: Self-pay | Admitting: Hematology and Oncology

## 2021-06-04 ENCOUNTER — Telehealth: Payer: Self-pay

## 2021-06-04 LAB — CEA: CEA: 1.8 ng/mL (ref 0.0–4.7)

## 2021-06-04 MED ORDER — ONDANSETRON HCL 4 MG PO TABS: 4.0000 mg | ORAL_TABLET | Freq: Three times a day (TID) | ORAL | 2 refills | Status: AC | PRN

## 2021-06-04 NOTE — Telephone Encounter (Signed)
Jasminne Mealy,RN: I notified pt and Ben of below. I reminded them to call us if she develops a temp of 100.4 or higher, day or night. They verbalized understanding.  Kelli Mosher,PA: I sent in zofran if that doesn't work call us back. Dr Hinton Rao: yes, I think avoid grapefruit juice Chasiti Waddington,RN: Is it ok for her to have pineapple juice? Dr Hinton Rao: yes, she can take her usual MVI, etc.  I will ask Kelli to order antiemetics, possibly something for anxiety.  I have her scheduled for weekly appts but may need to be seen sooner.  Kaileb Monsanto,RN: Pt's son calling to report his mom is a little anxious because she is nausteious and is afraid she is going to throw up. They do not have any antiemetic in the home. They also wants to know if she can continue taking her multivitamin, olive oil, magnesium and potassium supplements daily. I instructed them about avoiding spicy, fried, greasy foods. Eating several small meals a day, rather than 3 large meals.

## 2021-06-05 ENCOUNTER — Encounter: Payer: Self-pay | Admitting: Oncology

## 2021-06-09 ENCOUNTER — Encounter: Payer: Self-pay | Admitting: Oncology

## 2021-06-12 ENCOUNTER — Encounter: Payer: Self-pay | Admitting: Oncology

## 2021-06-12 ENCOUNTER — Inpatient Hospital Stay: Payer: Medicare Other

## 2021-06-12 ENCOUNTER — Encounter: Payer: Self-pay | Admitting: Hematology and Oncology

## 2021-06-12 ENCOUNTER — Inpatient Hospital Stay: Payer: Medicare Other | Attending: Oncology | Admitting: Hematology and Oncology

## 2021-06-12 DIAGNOSIS — Z79899 Other long term (current) drug therapy: Secondary | ICD-10-CM | POA: Insufficient documentation

## 2021-06-12 DIAGNOSIS — C7951 Secondary malignant neoplasm of bone: Secondary | ICD-10-CM | POA: Insufficient documentation

## 2021-06-12 DIAGNOSIS — C50412 Malignant neoplasm of upper-outer quadrant of left female breast: Secondary | ICD-10-CM | POA: Insufficient documentation

## 2021-06-12 DIAGNOSIS — Z853 Personal history of malignant neoplasm of breast: Secondary | ICD-10-CM | POA: Insufficient documentation

## 2021-06-12 DIAGNOSIS — Z9221 Personal history of antineoplastic chemotherapy: Secondary | ICD-10-CM | POA: Insufficient documentation

## 2021-06-12 DIAGNOSIS — Z171 Estrogen receptor negative status [ER-]: Secondary | ICD-10-CM | POA: Insufficient documentation

## 2021-06-12 DIAGNOSIS — I890 Lymphedema, not elsewhere classified: Secondary | ICD-10-CM | POA: Insufficient documentation

## 2021-06-12 DIAGNOSIS — Z85038 Personal history of other malignant neoplasm of large intestine: Secondary | ICD-10-CM | POA: Insufficient documentation

## 2021-06-12 DIAGNOSIS — Z5189 Encounter for other specified aftercare: Secondary | ICD-10-CM | POA: Insufficient documentation

## 2021-06-12 DIAGNOSIS — Z5112 Encounter for antineoplastic immunotherapy: Secondary | ICD-10-CM | POA: Insufficient documentation

## 2021-06-12 DIAGNOSIS — I10 Essential (primary) hypertension: Secondary | ICD-10-CM | POA: Insufficient documentation

## 2021-06-12 LAB — CBC AND DIFFERENTIAL
HCT: 41 (ref 36–46)
Hemoglobin: 13.7 (ref 12.0–16.0)
Neutrophils Absolute: 5.18
Platelets: 267 (ref 150–399)
WBC: 7.2

## 2021-06-12 LAB — BASIC METABOLIC PANEL
BUN: 14 (ref 4–21)
CO2: 24 — AB (ref 13–22)
Chloride: 104 (ref 99–108)
Creatinine: 0.7 (ref 0.5–1.1)
Glucose: 114
Potassium: 4.1 (ref 3.4–5.3)
Sodium: 137 (ref 137–147)

## 2021-06-12 LAB — HEPATIC FUNCTION PANEL
ALT: 35 (ref 7–35)
AST: 96 — AB (ref 13–35)
Alkaline Phosphatase: 298 — AB (ref 25–125)
Bilirubin, Total: 0.6

## 2021-06-12 LAB — COMPREHENSIVE METABOLIC PANEL
Albumin: 4.2 (ref 3.5–5.0)
Calcium: 8.4 — AB (ref 8.7–10.7)

## 2021-06-12 LAB — CBC: RBC: 4.3 (ref 3.87–5.11)

## 2021-06-12 NOTE — Assessment & Plan Note (Signed)
Stage IIB (T2c N1 M0) HER2 receptor positive left breast cancer, diagnosed in June 2020. This was ER/PR negative. This was treated with neoadjuvant Kadcyla/Perjeta and left mastectomy, with a complete response in breast and nodes. She had only received two doses of adjuvant Herceptin before being discontinued in December 2020. Liver metastases discovered 04/17/21.  Numerous heterogeneously enhancing, internally necrotic lesions in the right hepatic lobe, consistent with metastatic disease. Largest lesion measures up to 6.7 cm in hepatic segment VI, and there are at least 6 lesions. We now have biopsy proven HER2 positive recurrent breast cancer with ER/PR negative. She tolerated her first trastuzumab well with minimal side effects. She will return to clinic as scheduled prior to her second cycle.

## 2021-06-12 NOTE — Assessment & Plan Note (Signed)
History of stage II colon cancer, diagnosed in May 2007. This was treated with surgical resection and adjuvant chemotherapy with Xeloda. Colonoscopy from 2013 revealed 2 polyps which were resected and chronic diverticulosis. 

## 2021-06-12 NOTE — Assessment & Plan Note (Signed)
Hypercalcemia, improved following zoledronic acid on January 16th.

## 2021-06-12 NOTE — Assessment & Plan Note (Signed)
Bone metastases discovered 04/17/21. Enhancing osseous lesions in the L2 vertebral body, both iliac bones, and in the sacrum, highly concerning for osseous metastases. She received her 1st dose of zoledronic acid on January 16th

## 2021-06-12 NOTE — Progress Notes (Signed)
Patient Care Team: [name], MD as PCP - General (Family Medicine) [name], MD as Consulting Physician (Oncology)  Clinic Day:  06/12/2021  Referring physician: [name], MD  ASSESSMENT & PLAN:   Assessment & Plan: Malignant neoplasm of upper-outer quadrant of left breast in female, estrogen receptor negative ([place]) Stage IIB (T2c N1 M0) HER2 receptor positive left breast cancer, diagnosed in June 2020. This was ER/PR negative. This was treated with neoadjuvant Kadcyla/Perjeta and left mastectomy, with a complete response in breast and nodes. She had only received two doses of adjuvant Herceptin before being discontinued in December 2020. Liver metastases discovered 04/17/21.  Numerous heterogeneously enhancing, internally necrotic lesions in the right hepatic lobe, consistent with metastatic disease. Largest lesion measures up to 6.7 cm in hepatic segment VI, and there are at least 6 lesions. We now have biopsy proven HER2 positive recurrent breast cancer with ER/PR negative. She tolerated her first trastuzumab well with minimal side effects. She will return to clinic as scheduled prior to her second cycle.   Bone metastases ([place]) Bone metastases discovered 04/17/21. Enhancing osseous lesions in the L2 vertebral body, both iliac bones, and in the sacrum, highly concerning for osseous metastases. She received her 1st dose of zoledronic acid on January 16th  Hypercalcemia Hypercalcemia, improved following zoledronic acid on January 16th.  History of colon cancer, stage II History of stage II colon cancer, diagnosed in May 2007. This was treated with surgical resection and adjuvant chemotherapy with Xeloda. Colonoscopy from 2013 revealed 2 polyps which were resected and chronic diverticulosis.   The patient understands the plans discussed today and is in agreement with them.  She knows to contact our office if she develops concerns prior to her next  appointment.     Melodye Ped, NP  [facility] [address] [place] [address] Dept: [phone] Dept Fax: [phone]   Orders Placed This Encounter  Procedures   CBC and differential    This external order was created through the Results Console.   CBC    This external order was created through the Results Console.   Basic metabolic panel    This external order was created through the Results Console.   Comprehensive metabolic panel    This external order was created through the Results Console.   Hepatic function panel    This external order was created through the Results Console.      CHIEF COMPLAINT:  CC: An 88 year old female with recurrent breast cancer here for 10 day evaluation after 1st trastuzumab  Current Treatment:  Trastuzumab  INTERVAL HISTORY:  Jasmin Lewis is here today for repeat clinical assessment. She denies fevers or chills. She denies pain. Her appetite is good. Her weight has decreased 4 pounds over last 10 days .  I have reviewed the past medical history, past surgical history, social history and family history with the patient and they are unchanged from previous note.  ALLERGIES:  has no allergies on file.  MEDICATIONS:  Current Outpatient Medications  Medication Sig Dispense Refill   calcium citrate-vitamin D (CITRACAL+D) 315-200 MG-UNIT per tablet Take 1 tablet by mouth daily.     fish oil-omega-3 fatty acids 1000 MG capsule Take 1 g by mouth daily.     ondansetron (ZOFRAN) 4 MG tablet Take 1 tablet (4 mg total) by mouth every 8 (eight) hours as needed for nausea or vomiting. 20 tablet 2   oxyCODONE (  OXY IR/ROXICODONE) 5 MG immediate release tablet Take 1 tablet (5 mg total) by mouth every 4 (four) hours as needed. 100 tablet 0   polyethylene glycol (MIRALAX / GLYCOLAX) 17 g packet Take 17 g by mouth as needed. constipation     senna (SENOKOT) 8.6 MG TABS tablet Take 1  tablet by mouth 2 (two) times daily.     No current facility-administered medications for this visit.    HISTORY OF PRESENT ILLNESS:   Oncology History Overview Note  Cancer Staging Malignant neoplasm of upper-outer quadrant of left breast in female, estrogen receptor negative ([place]) Staging form: Breast, AJCC 8th Edition - Clinical stage from 10/11/2018: Stage IIB (cT2, cN1, cM0, G3, ER-, PR-, HER2+) - Signed by Truitt Merle, MD on 11/02/2018 - Clinical: No stage assigned - Unsigned    History of colon cancer, stage II  09/2005 Initial Diagnosis   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 Findings were a 5.9 x 3.8 x 1.2 cm moderate to well-differentiated adenocarcinoma penetrating the muscular wall. Forty lymph nodes negative. No vascular or lymphatic invasion. Multiple diverticula with microabscess formation and inflammation. Carcinoma present in at least 1 diverticulum. Preop CEA 5.2 with lab normal range 0 to 2.5. Preop CT scan with no obvious additional pathology.    2007 -  Chemotherapy   She received oral Xeloda chemotherapy as an adjuvant. She declined treatment on a clinical trial.    11/12/2011 Initial Diagnosis   [name], stage II   12/09/2011 Procedure   Followup colonoscopy done on 12/09/2011. She was found to have 2 polyps which were removed. Chronic diverticulosis.     Malignant neoplasm of upper-outer quadrant of left breast in female, estrogen receptor negative ([place])  09/26/2018 Mammogram   Mammogram/US of left breast 09/26/18 IMPRESSION:  1. 2.9cm irregular mass in the upper outer left breast corresponds to the palpable abnormality. This is highly suspicious for breast carcinoma.  2. Two adjacent abnormal left axillary  Lymph nodes suspicious for metastatic adenopathy. There is a third borderline abnormal left axillary LN with a cortex thickened to 44m.  3. Benign right  breast cyst. No evidence of right breast malignancy.    10/11/2018 Cancer Staging   Staging form: Breast, AJCC 8th Edition - Clinical stage from 10/11/2018: Stage IIB (cT2, cN1, cM0, G3, ER-, PR-, HER2+) - Signed by [name] MD on 11/02/2018    10/11/2018 Initial Biopsy   Diagnosis 10/11/18 1. Breast, left, needle core biopsy, upper outer left 2 o'clock - INVASIVE DUCTAL CARCINOMA. - DUCTAL CARCINOMA IN SITU. - LYMPHOVASCULAR INVASION IS IDENTIFIED. - SEE COMMENT. 2. Lymph node, needle/core biopsy, left axilla - INVASIVE DUCTAL CARCINOMA. - SEE COMMENT.   10/11/2018 Receptors her2   The tumor cells are POSITIVE for Her2 (3+). Estrogen Receptor: 0%, NEGATIVE Progesterone Receptor: 0%, NEGATIVE Proliferation Marker Ki67: 70%   11/02/2018 Initial Diagnosis   Malignant neoplasm of upper-outer quadrant of left breast in female, estrogen receptor negative ([place]   11/15/2018 Breast MRI   MRI breast 11/15/18  IMPRESSION: 1. 3.3 centimeter mass in the LATERAL portion of the LEFT breast consistent with known malignancy. 2. There is significant non mass enhancement surrounding this mass and extending anteriorly into the nipple base, with largest diameter in the anterior to posterior axis, measuring 7.2 centimeters. 3. If the patient would consider breast conservation, additional MR guided core biopsies are recommended. Consider biopsy of the  inferior and anterior extent of the non mass enhancement to document extent of disease. 4. Three enlarged LEFT axillary lymph nodes. 5. RIGHT breast is negative.   11/15/2018 PET scan   PET 11/15/18 IMPRESSION: Hypermetabolic left breast lesion compatible with known primary. Hypermetabolic left axillary lymph nodes are consistent with metastatic disease.   No evidence for additional hypermetabolic metastatic involvement in the neck, chest, abdomen, or pelvis.   11/17/2018 - 03/01/2019 Chemotherapy   Neo-adjuvant Kadcyla and perjeta q3weeks for 6 cycles  starting 11/17/18. Stopped before surgery    11/29/2018 Pathology Results   Diagnosis 1. Breast, left, needle core biopsy, inferior anterior (cylinder clip) - INVASIVE DUCTAL CARCINOMA. - DUCTAL CARCINOMA IN SITU. - LYMPHOVASCULAR INVASION IS IDENTIFIED. - SEE COMMENT. 2. Breast, left, needle core biopsy, central posterior (barbell clip) - INVASIVE DUCTAL CARCINOMA. - LYMPHOVASCULAR INVASION IS IDENTIFIED.   02/12/2019 Breast MRI   IMPRESSION: 1. Complete resolution of previously identified enhancing mass and associated non mass enhancement within the left breast. This is consistent with excellent response to chemotherapy. No residual or suspicious findings are identified. 2. No MRI evidence of malignancy on the right. 3. Previously identified left axillary lymphadenopathy not definitively seen on today's study. However, this may be due to decreased field-of-view compared to prior study.     03/14/2019 Cancer Staging   Staging form: Breast, AJCC 8th Edition - Pathologic stage from 03/14/2019: pT0, pN0, cM0, GX, ER: Unknown, PR: Unknown, HER2: Not Assessed - Signed by Truitt Merle, MD on 03/28/2019    03/14/2019 Surgery   LEFT MASTECTOMY WITH TARGETED LYMPH NODE DISSECTION and Left Axillary Sentinel Lymph  Node Biopsy by Dr [name] 03/14/19    03/14/2019 Pathology Results   FINAL MICROSCOPIC DIAGNOSIS:   A. LYMPH NODE, LEFT, SENTINEL, BIOPSY:  - There is no evidence of carcinoma in 1 of 1 lymph node (0/1).   B. BREAST, LEFT, MASTECTOMY:  - Benign breast parenchyma with treatment-related changes.  - There is no evidence of malignancy.  - See oncology table below.   C. LYMPH NODE, LEFT #1, SENTINEL, BIOPSY:  - There is no evidence of carcinoma in 1 of 1 lymph node (0/1).   D. LYMPH NODE, LEFT #2, SENTINEL, BIOPSY:  - There is no evidence of carcinoma in 1 of 1 lymph node (0/1).     04/04/2019 - 04/25/2019 Chemotherapy   Maintenance Herceptin injections every 3 weeks starting  04/03/19 to complete 1 year of treatment that was started in 11/2018. Stopped after 2nd dose as she will not be repeating [facility].    06/03/2021 -  Chemotherapy   Patient is on Treatment Plan : BREAST Trastuzumab q21d X 11 Cycles     Liver metastases ([place])  04/17/2021 Imaging   CT ABDOMEN AND PELVIS WITH CONTRAST: -New lytic lesion involving the L2 vertebral body (6:72, 2:26) with minimal cortical breakthrough superiorly, but with preservation of vertebral body height, concerning for metastatic disease.  -There are several indeterminate hepatic lesions as described above. In addition to the lesions described above, there is an additional subtle 1.5 cm hypodensity in the hepatic dome (2:7). Given clinical history and presence of a new L2 lesion, leading differential consideration is metastatic disease. Recommend further evaluation with MRI of the abdomen with and without contrast.   05/12/2021 Imaging   MRI ABDOMEN WITH AND WITHOUT CONTRAST: Numerous heterogeneously enhancing, internally necrotic lesions in the right hepatic lobe, highly concerning for metastatic disease. Largest lesion measures up to 6.7 cm in hepatic segment VI.  Enhancing osseous lesions in the L2 vertebral body, both iliac bones, and in the sacrum, highly concerning for osseous metastases.   05/15/2021 Initial Diagnosis   Liver metastases ([place])   05/27/2021 PET scan   1. Widespread recurrent/metastatic disease in this patient who is  status post bilateral mastectomy. Left chest wall recurrence with  left axillary/subpectoral, supraclavicular nodal metastasis.  2. Hepatic, left adrenal, and widespread osseous metastasis.  3. Incidental findings, including: Right nephrolithiasis. Tiny  hiatal hernia.    05/27/2021 Imaging   MRI LUMBAR AND THORACIC SPINE: Thoracic spine:  Osseous metastatic disease involving each level. The most dramatic deposits are at T4 and T6 where there is extraosseous/epidural tumor. No cord compression  but there is foraminal impingement on the left at T6-7 and on the right at T3-4. Early dorsal epidural tumor at the level of T10 and T3.   Lumbar spine:  1. Widespread osseous metastatic disease with largest deposit  replacing the L2 body where there is a compression fracture with  mild height loss. Also at this level is early epidural tumor  extension likely affecting both L2-3 foramina.  2. 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    05/27/2021 Imaging   MRI HEAD WITH AND WITHOUT CONTRAST: Negative for metastatic disease to the brain or calvarium.   06/03/2021 -  Chemotherapy   Patient is on Treatment Plan : BREAST Trastuzumab q21d X 11 Cycles     Bone metastases ([place])  04/17/2021 Imaging   CT ABDOMEN AND PELVIS WITH CONTRAST: -New lytic lesion involving the L2 vertebral body (6:72, 2:26) with minimal cortical breakthrough superiorly, but with preservation of vertebral body height, concerning for metastatic disease.  -There are several indeterminate hepatic lesions as described above. In addition to the lesions described above, there is an additional subtle 1.5 cm hypodensity in the hepatic dome (2:7). Given clinical history and presence of a new L2 lesion, leading differential consideration is metastatic disease. Recommend further evaluation with MRI of the abdomen with and without contrast.   05/12/2021 Imaging   MRI ABDOMEN WITH AND WITHOUT CONTRAST: Numerous heterogeneously enhancing, internally necrotic lesions in the right hepatic lobe, highly concerning for metastatic disease. Largest lesion measures up to 6.7 cm in hepatic segment VI.   Enhancing osseous lesions in the L2 vertebral body, both iliac bones, and in the sacrum, highly concerning for osseous metastases.   05/15/2021 Initial Diagnosis   Bone metastases ([place])   05/27/2021 PET scan   1. Widespread recurrent/metastatic disease in this patient who is  status post bilateral mastectomy. Left chest  wall recurrence with  left axillary/subpectoral, supraclavicular nodal metastasis.  2. Hepatic, left adrenal, and widespread osseous metastasis.  3. Incidental findings, including: Right nephrolithiasis. Tiny  hiatal hernia.    05/27/2021 Imaging   MRI LUMBAR AND THORACIC SPINE: Thoracic spine:  Osseous metastatic disease involving each level. The most dramatic deposits are at T4 and T6 where there is extraosseous/epidural tumor. No cord compression but there is foraminal impingement on the left at T6-7 and on the right at T3-4. Early dorsal epidural tumor at the level of T10 and T3.   Lumbar spine:  1. Widespread osseous metastatic disease with largest deposit  replacing the L2 body where there is a compression fracture with  mild height loss. Also at this level is early epidural tumor  extension likely affecting both L2-3 foramina.  2. 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    05/27/2021 Imaging   MRI HEAD  WITH AND WITHOUT CONTRAST: Negative for metastatic disease to the brain or calvarium.   06/03/2021 -  Chemotherapy   Patient is on Treatment Plan : BREAST Trastuzumab q21d X 11 Cycles         REVIEW OF SYSTEMS:   Constitutional: Denies fevers, chills or abnormal weight loss Eyes: Denies blurriness of vision Ears, nose, mouth, throat, and face: Denies mucositis or sore throat Respiratory: Denies cough, dyspnea or wheezes Cardiovascular: Denies palpitation, chest discomfort or lower extremity swelling Gastrointestinal:  Denies nausea, heartburn or change in bowel habits Skin: Denies abnormal skin rashes Lymphatics: Denies new lymphadenopathy or easy bruising Neurological:Denies numbness, tingling or new weaknesses Behavioral/Psych: Mood is stable, no new changes  All other systems were reviewed with the patient and are negative.   VITALS:  Blood pressure (!) 168/77, pulse 88, temperature 97.6 F (36.4 C), temperature source Oral, resp. rate  18, height 5' 1"  (1.549 m), weight 115 lb (52.2 kg), SpO2 97 %.  Wt Readings from Last 3 Encounters:  06/12/21 115 lb (52.2 kg)  06/03/21 119 lb 3.2 oz (54.1 kg)  05/25/21 119 lb (54 kg)    Body mass index is 21.73 kg/m.  Performance status (ECOG): 1 - Symptomatic but completely ambulatory  PHYSICAL EXAM:   GENERAL:alert, no distress and comfortable SKIN: skin color, texture, turgor are normal, no rashes or significant lesions EYES: normal, Conjunctiva are pink and non-injected, sclera clear OROPHARYNX:no exudate, no erythema and lips, buccal mucosa, and tongue normal  NECK: supple, thyroid normal size, non-tender, without nodularity LYMPH:  no palpable lymphadenopathy in the cervical, axillary or inguinal LUNGS: clear to auscultation and percussion with normal breathing effort HEART: regular rate & rhythm and no murmurs and no lower extremity edema ABDOMEN:abdomen soft, non-tender and normal bowel sounds Musculoskeletal:no cyanosis of digits and no clubbing  NEURO: alert & oriented x 3 with fluent speech, no focal motor/sensory deficits  LABORATORY DATA:  I have reviewed the data as listed    Component Value Date/Time   NA 137 06/12/2021 0000   NA 137 11/13/2012 1057   K 4.1 06/12/2021 0000   K 4.5 11/13/2012 1057   CL 104 06/12/2021 0000   CO2 24 (A) 06/12/2021 0000   CO2 26 11/13/2012 1057   GLUCOSE 104 (H) 04/25/2019 1045   GLUCOSE 90 11/13/2012 1057   BUN 14 06/12/2021 0000   BUN 18.3 11/13/2012 1057   CREATININE 0.7 06/12/2021 0000   CREATININE 0.90 04/25/2019 1045   CREATININE 0.8 11/13/2012 1057   CALCIUM 8.4 (A) 06/12/2021 0000   CALCIUM 9.7 11/13/2012 1057   PROT 7.4 04/25/2019 1045   PROT 7.4 11/13/2012 1057   ALBUMIN 4.2 06/12/2021 0000   ALBUMIN 3.9 11/13/2012 1057   AST 96 (A) 06/12/2021 0000   AST 41 04/25/2019 1045   AST 22 11/13/2012 1057   ALT 35 06/12/2021 0000   ALT 29 04/25/2019 1045   ALT 15 11/13/2012 1057   ALKPHOS 298 (A) 06/12/2021 0000    ALKPHOS 77 11/13/2012 1057   BILITOT 0.5 04/25/2019 1045   BILITOT 0.54 11/13/2012 1057   GFRNONAA 58 (L) 04/25/2019 1045   GFRAA >60 04/25/2019 1045    No results found for: SPEP, UPEP  Lab Results  Component Value Date   WBC 7.2 06/12/2021   NEUTROABS 5.18 06/12/2021   HGB 13.7 06/12/2021   HCT 41 06/12/2021   MCV 95.9 04/25/2019   PLT 267 06/12/2021      Chemistry      Component  Value Date/Time   NA 137 06/12/2021 0000   NA 137 11/13/2012 1057   K 4.1 06/12/2021 0000   K 4.5 11/13/2012 1057   CL 104 06/12/2021 0000   CO2 24 (A) 06/12/2021 0000   CO2 26 11/13/2012 1057   BUN 14 06/12/2021 0000   BUN 18.3 11/13/2012 1057   CREATININE 0.7 06/12/2021 0000   CREATININE 0.90 04/25/2019 1045   CREATININE 0.8 11/13/2012 1057   GLU 114 06/12/2021 0000      Component Value Date/Time   CALCIUM 8.4 (A) 06/12/2021 0000   CALCIUM 9.7 11/13/2012 1057   ALKPHOS 298 (A) 06/12/2021 0000   ALKPHOS 77 11/13/2012 1057   AST 96 (A) 06/12/2021 0000   AST 41 04/25/2019 1045   AST 22 11/13/2012 1057   ALT 35 06/12/2021 0000   ALT 29 04/25/2019 1045   ALT 15 11/13/2012 1057   BILITOT 0.5 04/25/2019 1045   BILITOT 0.54 11/13/2012 1057       RADIOGRAPHIC STUDIES: I have personally reviewed the radiological images as listed and agreed with the findings in the report. No results found.

## 2021-06-18 NOTE — Progress Notes (Signed)
[place]  [address] [place],  [place]  [address] [phone]  Clinic Day:  06/24/2021  Referring physician: [name], MD  This document serves as a record of services personally performed by [name], MD. It was created on their behalf by [facility], a trained medical scribe. The creation of this record is based on the scribe's personal observations and the provider's statements to them.  ASSESSMENT & PLAN:   Stage IIB (T2c N1 M0) HER2 receptor positive left breast cancer, diagnosed in June 2020. This was ER/PR negative. This was treated with neoadjuvant Kadcyla/Perjeta and left mastectomy, with a complete response in breast and nodes. She had only received two doses of adjuvant Herceptin before being discontinued in December 2020.  History of stage II colon cancer, diagnosed in May 2007. This was treated with surgical resection and adjuvant chemotherapy with Xeloda. Colonoscopy from 2013 revealed 2 polyps which were resected and chronic diverticulosis.  Liver metastases, diagnosed in December 2022.  Numerous heterogeneously enhancing, internally necrotic lesions in the right hepatic lobe, consistent with metastatic disease. Largest lesion measures up to 6.7 cm in hepatic segment VI, and there are at least 6 lesions. We now have biopsy proven HER2 positive recurrent breast cancer with ER/PR negative. She tolerated her first trastuzumab well with minimal side effects.  Bone metastases, December 2022. Enhancing osseous lesions in the L2 vertebral body, both iliac bones, and in the sacrum, highly concerning for osseous metastases. She received her 1st dose of zoledronic acid on January 16th.  Left supraclavicular lymph node measuring 2-3 cm. Also involvement of subpectoral node and chest wall skin. This is now rapidly progressive despite receiving her 1st dose of trastuzumab.  We will plan to add in Perjeta to her current HER2 targeted  therapy as well as chemotherapy with Taxotere.  Lymphedema of the left upper extremity. She continues to follow with Amy, the lymphedema specialist.   Hypercalcemia, improved following zoledronic acid on January 16th.  Numbness and paresthesias of the left hand which I feel is related to her spine metastasis involvement at T5. We could consider focused radiation, but I am much more concerned that her systemic disease is rapidly progressing.  She will proceed with her 2nd cycle of trastruzumab today as planned. However, with the evidence of progressive disease, we discussed adding in Perjeta as well as chemotherapy with Taxotere. The schedule and potential toxicities were reviewed and she is in agreement so we will administer these therapies tomorrow. If she has difficulty tolerating, we can adjust the dose accordingly. We will also plan for Neulasta in order to prevent potential associated cytopenias on Friday. Melissa will meet with her this afternoon for chemoeducation. Otherwise, we will see her back in 10 days with CBC and CMP to see how she tolerated her new regimen. We plan to repeat scans after 3 months. We discussed the grave prognosis and hopefully this new therapy will give her some benefit. She and her son understand and agree with this plan of care. I have answered their questions and she knows to call with any concerns.    I provided 20 minutes of face-to-face time during this this encounter and > 50% was spent counseling as documented under my assessment and plan.    [name], MD [place] [address] [place] [address] Dept: [phone] Dept Fax: [phone]   CHIEF COMPLAINT:  CC: Stage IIB HER2 receptor  positive left breast cancer with evidence of metastatic disease  Current Treatment:  Trastuzumab   HISTORY OF PRESENT ILLNESS:  Jasmin Lewis is a 88 y.o. female who presents for a  transfer of care for there continued evaluation and management of stage IIB left breast cancer. She was originally diagnosed in June 2020 when she was able to palpate a mass of the left breast. Mammogram and ultrasound from May 2020 revealed a 2.9 cm irregular mass in the upper outer left breast as well as two adjacent abnormal left axillary lymph nodes. Biopsy was obtained on June 3rd and revealed invasive ductal carcinoma and DCIS with lymphovascular invasion. Left axillary lymph node was positive for invasive ductal carcinoma. HER2 was positive 3+. Estrogen and progesterone receptors were both negative at 0%, and Ki67 was 70%. Breast MRI from July revealed a 3.3 cm mass of the lateral portion of the left breast as well as significant non mass enhancement surrounding this mass and extending anteriorly into the nipple base, with largest diameter in the anterior to posterior axis, measuring 7.2 centimeters. Three enlarged left axillary lymph nodes. Right breast was negative.  PET imaging from July revealed no evidence of additional metastatic involvement. She received neoadjuvant Kadcyla/Perjeta for 6 cycles. Repeat breast MRI from October revealed complete resolution of the enhancing mass and associated non mass enhancement within the left breast. Previously identified left axillary lymphadenopathy was no longer identified. She underwent left mastectomy and targeted lymph node dissection in November 2020 and final pathology from this procedure confirmed no evidence of malignancy, for a complete response. Three sentinel lymph nodes were negative for malignancy (0/3). She continued maintenance Herceptin injections for two more doses, but this was discontinued in December 2020 as she refused further ECHO scans.   Of note, she also has a history of stage II colon cancer, diagnosed in May 2007. Preop CEA was 5.2. She underwent surgical resection and surgical pathology revealed a 5.9 x 3.8 x 1.2 cm moderate to  well-differentiated adenocarcinoma penetrating the muscular wall. Forty lymph nodes negative. No vascular or lymphatic invasion. Multiple diverticula with microabscess formation and inflammation. Carcinoma present in at least 1 diverticulum. She received adjuvant chemotherapy with Xeloda for 9 months. Colonoscopy from August 2013 revealed 2 polyps which were resected and chronic diverticulosis.  Michella states that she presented to [facility] ER on December 9th due to constipation and back pain with spasms. CT abdomen/pelvis from December 9th revealed new lytic lesion involving the L2 vertebral body with minimal cortical breakthrough superiorly, but with preservation of vertebral body height, concerning for metastatic disease. There are several indeterminate hepatic lesions. In addition to the lesions, there is an additional subtle 1.5 cm hypodensity in the hepatic dome. MRI abdomen from January 3rd revealed numerous heterogeneously enhancing, internally necrotic lesions in the right hepatic lobe, highly concerning for metastatic disease. Largest lesion measures up to 6.7 cm in hepatic segment VI. Enhancing osseous lesions in the L2 vertebral body, both iliac bones, and in the sacrum, highly concerning for osseous metastases. She has chronic lymphedema of the left upper extremity since November 2022 after an accident, for which she wears a compression sleeve. However, her skin is reacting to the plastic cuff of the sleeve, even though she has tried turning it inside out. She is limited in her mobility of the right upper extremity due to prior incident of dislocated shoulder. I met with her on January 6th and reviewed the imaging. I recommended further evaluation before finalizing a treatment plan.  We scheduled a PET scan, MRI brain and spine, and ECHO. We also referred her to Dr. Lilia Pro for node biopsy and port placement. When he saw her, he found a nodule in the upper left chest wall which was suspicious and  biopsied this rather than the left supraclavicular node. She underwent biopsy of a chest wall nodule on January 12th with Dr. Lilia Pro. Pathology confirmed carcinoma, 5 mm nodule, involving dermis, subcutaneous tissue and lymphovascular space, compatible with metastatic breast carcinoma. CK7 positive, estrogen staining is mostly negative but there is some faint positivity. HER2 was positive 3+. Estrogen and progesterone receptors were negative. Ki67 was 70%.   PET imaging from January 18th revealed widespread recurrent/metastatic disease with left chest wall recurrence with left axillary/subpectoral, supraclavicular nodal metastasis. Hepatic, left adrenal, and widespread osseous metastasis were also seen. MRI head from January 20th was negative. ECHO revealed a normal EF between 60 - 65 %. MRI of the thoracic and lumbar spine revealed osseous metastatic disease involving each level. The most dramatic deposits of the thoracic spine are at T4 and T6 where there is extraosseous/epidural tumor. No cord compression but there is foraminal impingement on the left at T6-7 and on the right at T3-4. Early dorsal epidural tumor at the level of T10 and T3. Widespread osseous metastatic disease with largest deposit of the lumbar spine replacing the L2 body where there is a compression fracture with mild height loss. Also at this level is early epidural tumor extension likely affecting both L2-3 foramina. Lumbar spine degeneration with scoliosis and multilevel impingement. I therefore recommended treatment with trastuzumab as she should be able to tolerate this well. That was started on January 25th. She also had hypercalcemia and was given her 1st dose of Xgeva on January 16th for her bone metastases.  INTERVAL HISTORY:  I have reviewed her chart and materials related to her cancer extensively and collaborated history with the patient. Summary of oncologic history is as follows: Oncology History Overview Note  Cancer  Staging Malignant neoplasm of upper-outer quadrant of left breast in female, estrogen receptor negative ([place]) Staging form: Breast, AJCC 8th Edition - Clinical stage from 10/11/2018: Stage IIB (cT2, cN1, cM0, G3, ER-, PR-, HER2+) - Signed by Truitt Merle, MD on 11/02/2018 - Clinical: No stage assigned - Unsigned    History of colon cancer, stage II  09/2005 Initial Diagnosis   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 Findings were a 5.9 x 3.8 x 1.2 cm moderate to well-differentiated adenocarcinoma penetrating the muscular wall. Forty lymph nodes negative. No vascular or lymphatic invasion. Multiple diverticula with microabscess formation and inflammation. Carcinoma present in at least 1 diverticulum. Preop CEA 5.2 with lab normal range 0 to 2.5. Preop CT scan with no obvious additional pathology.    2007 -  Chemotherapy   She received oral Xeloda chemotherapy as an adjuvant. She declined treatment on a clinical trial.    11/12/2011 Initial Diagnosis   [name], stage II   12/09/2011 Procedure   Followup colonoscopy done on 12/09/2011. She was found to have 2 polyps which were removed. Chronic diverticulosis.     Malignant neoplasm of upper-outer quadrant of left breast in female, estrogen receptor negative ([place])  09/26/2018 Mammogram   Mammogram/US of left breast 09/26/18 IMPRESSION:  1. 2.9cm irregular mass in the upper outer left breast corresponds to the palpable abnormality. This is highly suspicious for breast  carcinoma.  2. Two adjacent abnormal left axillary  Lymph nodes suspicious for metastatic adenopathy. There is a third borderline abnormal left axillary LN with a cortex thickened to 11m.  3. Benign right breast cyst. No evidence of right breast malignancy.    10/11/2018 Cancer Staging   Staging form: Breast, AJCC 8th Edition - Clinical stage from 10/11/2018: Stage IIB (cT2, cN1, cM0,  G3, ER-, PR-, HER2+) - Signed by [name] MD on 11/02/2018    10/11/2018 Initial Biopsy   Diagnosis 10/11/18 1. Breast, left, needle core biopsy, upper outer left 2 o'clock - INVASIVE DUCTAL CARCINOMA. - DUCTAL CARCINOMA IN SITU. - LYMPHOVASCULAR INVASION IS IDENTIFIED. - SEE COMMENT. 2. Lymph node, needle/core biopsy, left axilla - INVASIVE DUCTAL CARCINOMA. - SEE COMMENT.   10/11/2018 Receptors her2   The tumor cells are POSITIVE for Her2 (3+). Estrogen Receptor: 0%, NEGATIVE Progesterone Receptor: 0%, NEGATIVE Proliferation Marker Ki67: 70%   11/02/2018 Initial Diagnosis   Malignant neoplasm of upper-outer quadrant of left breast in female, estrogen receptor negative ([place]   11/15/2018 Breast MRI   MRI breast 11/15/18  IMPRESSION: 1. 3.3 centimeter mass in the LATERAL portion of the LEFT breast consistent with known malignancy. 2. There is significant non mass enhancement surrounding this mass and extending anteriorly into the nipple base, with largest diameter in the anterior to posterior axis, measuring 7.2 centimeters. 3. If the patient would consider breast conservation, additional MR guided core biopsies are recommended. Consider biopsy of the inferior and anterior extent of the non mass enhancement to document extent of disease. 4. Three enlarged LEFT axillary lymph nodes. 5. RIGHT breast is negative.   11/15/2018 PET scan   PET 11/15/18 IMPRESSION: Hypermetabolic left breast lesion compatible with known primary. Hypermetabolic left axillary lymph nodes are consistent with metastatic disease.   No evidence for additional hypermetabolic metastatic involvement in the neck, chest, abdomen, or pelvis.   11/17/2018 - 03/01/2019 Chemotherapy   Neo-adjuvant Kadcyla and perjeta q3weeks for 6 cycles starting 11/17/18. Stopped before surgery    11/29/2018 Pathology Results   Diagnosis 1. Breast, left, needle core biopsy, inferior anterior (cylinder clip) - INVASIVE DUCTAL  CARCINOMA. - DUCTAL CARCINOMA IN SITU. - LYMPHOVASCULAR INVASION IS IDENTIFIED. - SEE COMMENT. 2. Breast, left, needle core biopsy, central posterior (barbell clip) - INVASIVE DUCTAL CARCINOMA. - LYMPHOVASCULAR INVASION IS IDENTIFIED.   02/12/2019 Breast MRI   IMPRESSION: 1. Complete resolution of previously identified enhancing mass and associated non mass enhancement within the left breast. This is consistent with excellent response to chemotherapy. No residual or suspicious findings are identified. 2. No MRI evidence of malignancy on the right. 3. Previously identified left axillary lymphadenopathy not definitively seen on today's study. However, this may be due to decreased field-of-view compared to prior study.     03/14/2019 Cancer Staging   Staging form: Breast, AJCC 8th Edition - Pathologic stage from 03/14/2019: pT0, pN0, cM0, GX, ER: Unknown, PR: Unknown, HER2: Not Assessed - Signed by [name] MD on 03/28/2019    03/14/2019 Surgery   LEFT MASTECTOMY WITH TARGETED LYMPH NODE DISSECTION and Left Axillary Sentinel Lymph  Node Biopsy by Dr TMarlou Starks11/4/20    03/14/2019 Pathology Results   FINAL MICROSCOPIC DIAGNOSIS:   A. LYMPH NODE, LEFT, SENTINEL, BIOPSY:  - There is no evidence of carcinoma in 1 of 1 lymph node (0/1).   B. BREAST, LEFT, MASTECTOMY:  - Benign breast parenchyma with treatment-related changes.  - There is no evidence of malignancy.  - See oncology  table below.   C. LYMPH NODE, LEFT #1, SENTINEL, BIOPSY:  - There is no evidence of carcinoma in 1 of 1 lymph node (0/1).   D. LYMPH NODE, LEFT #2, SENTINEL, BIOPSY:  - There is no evidence of carcinoma in 1 of 1 lymph node (0/1).     04/04/2019 - 04/25/2019 Chemotherapy   Maintenance Herceptin injections every 3 weeks starting 04/03/19 to complete 1 year of treatment that was started in 11/2018. Stopped after 2nd dose as she will not be repeating Echos.    06/03/2021 -  Chemotherapy   Patient is on  Treatment Plan : BREAST Trastuzumab q21d X 11 Cycles     Liver metastases ([place])  04/17/2021 Imaging   CT ABDOMEN AND PELVIS WITH CONTRAST: -New lytic lesion involving the L2 vertebral body (6:72, 2:26) with minimal cortical breakthrough superiorly, but with preservation of vertebral body height, concerning for metastatic disease.  -There are several indeterminate hepatic lesions as described above. In addition to the lesions described above, there is an additional subtle 1.5 cm hypodensity in the hepatic dome (2:7). Given clinical history and presence of a new L2 lesion, leading differential consideration is metastatic disease. Recommend further evaluation with MRI of the abdomen with and without contrast.   05/12/2021 Imaging   MRI ABDOMEN WITH AND WITHOUT CONTRAST: Numerous heterogeneously enhancing, internally necrotic lesions in the right hepatic lobe, highly concerning for metastatic disease. Largest lesion measures up to 6.7 cm in hepatic segment VI.   Enhancing osseous lesions in the L2 vertebral body, both iliac bones, and in the sacrum, highly concerning for osseous metastases.   05/15/2021 Initial Diagnosis   Liver metastases ([place])   05/27/2021 PET scan   1. Widespread recurrent/metastatic disease in this patient who is  status post bilateral mastectomy. Left chest wall recurrence with  left axillary/subpectoral, supraclavicular nodal metastasis.  2. Hepatic, left adrenal, and widespread osseous metastasis.  3. Incidental findings, including: Right nephrolithiasis. Tiny  hiatal hernia.    05/27/2021 Imaging   MRI LUMBAR AND THORACIC SPINE: Thoracic spine:  Osseous metastatic disease involving each level. The most dramatic deposits are at T4 and T6 where there is extraosseous/epidural tumor. No cord compression but there is foraminal impingement on the left at T6-7 and on the right at T3-4. Early dorsal epidural tumor at the level of T10 and T3.   Lumbar spine:  1. Widespread osseous  metastatic disease with largest deposit  replacing the L2 body where there is a compression fracture with  mild height loss. Also at this level is early epidural tumor  extension likely affecting both L2-3 foramina.  2. 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    05/27/2021 Imaging   MRI HEAD WITH AND WITHOUT CONTRAST: Negative for metastatic disease to the brain or calvarium.   06/03/2021 -  Chemotherapy   Patient is on Treatment Plan : BREAST Trastuzumab q21d X 11 Cycles     Bone metastases ([place])  04/17/2021 Imaging   CT ABDOMEN AND PELVIS WITH CONTRAST: -New lytic lesion involving the L2 vertebral body (6:72, 2:26) with minimal cortical breakthrough superiorly, but with preservation of vertebral body height, concerning for metastatic disease.  -There are several indeterminate hepatic lesions as described above. In addition to the lesions described above, there is an additional subtle 1.5 cm hypodensity in the hepatic dome (2:7). Given clinical history and presence of a new L2 lesion, leading differential consideration is metastatic disease. Recommend further evaluation with MRI of the abdomen with and  without contrast.   05/12/2021 Imaging   MRI ABDOMEN WITH AND WITHOUT CONTRAST: Numerous heterogeneously enhancing, internally necrotic lesions in the right hepatic lobe, highly concerning for metastatic disease. Largest lesion measures up to 6.7 cm in hepatic segment VI.   Enhancing osseous lesions in the L2 vertebral body, both iliac bones, and in the sacrum, highly concerning for osseous metastases.   05/15/2021 Initial Diagnosis   Bone metastases ([place])   05/27/2021 PET scan   1. Widespread recurrent/metastatic disease in this patient who is  status post bilateral mastectomy. Left chest wall recurrence with  left axillary/subpectoral, supraclavicular nodal metastasis.  2. Hepatic, left adrenal, and widespread osseous metastasis.  3. Incidental findings,  including: Right nephrolithiasis. Tiny  hiatal hernia.    05/27/2021 Imaging   MRI LUMBAR AND THORACIC SPINE: Thoracic spine:  Osseous metastatic disease involving each level. The most dramatic deposits are at T4 and T6 where there is extraosseous/epidural tumor. No cord compression but there is foraminal impingement on the left at T6-7 and on the right at T3-4. Early dorsal epidural tumor at the level of T10 and T3.   Lumbar spine:  1. Widespread osseous metastatic disease with largest deposit  replacing the L2 body where there is a compression fracture with  mild height loss. Also at this level is early epidural tumor  extension likely affecting both L2-3 foramina.  2. 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    05/27/2021 Imaging   MRI HEAD WITH AND WITHOUT CONTRAST: Negative for metastatic disease to the brain or calvarium.   06/03/2021 -  Chemotherapy   Patient is on Treatment Plan : BREAST Trastuzumab q21d X 11 Cycles      Ellar is here for routine follow up prior to a 2nd cycle of trastuzumab. She tolerated her 1st dose without difficulty. She complains of neuropathy of the left hand, and continues to have lymphedema of the left upper extremity. She is following with Amy the lymphedema specialist. She notes generalized arthralgias and back pain, which is fairly stable. She also has pain of the left neck area, which feels like a "brick" laying there. She has had some constipation. Blood counts and chemistries are unremarkable except for an SGOT of 155, an SGPT of 46 and an alkaline phosphatase of 417, all which have worsened. Her  appetite is good, and she has lost 1 pound since her last visit.  She denies fever, chills or other signs of infection.  She denies nausea, vomiting or abdominal pain.  She denies sore throat, cough, dyspnea, or chest pain. She is accompanied by her son Suezanne Jacquet.  HISTORY:   Allergies: Not on File  Current Medications: Current  Outpatient Medications  Medication Sig Dispense Refill   fish oil-omega-3 fatty acids 1000 MG capsule Take 1 g by mouth daily.     ondansetron (ZOFRAN) 4 MG tablet Take 1 tablet (4 mg total) by mouth every 8 (eight) hours as needed for nausea or vomiting. 20 tablet 2   oxyCODONE (OXY IR/ROXICODONE) 5 MG immediate release tablet Take 1 tablet (5 mg total) by mouth every 4 (four) hours as needed. 100 tablet 0   polyethylene glycol (MIRALAX / GLYCOLAX) 17 g packet Take 17 g by mouth as needed. constipation     senna (SENOKOT) 8.6 MG TABS tablet Take 1 tablet by mouth 2 (two) times daily.     calcium citrate-vitamin D (CITRACAL+D) 315-200 MG-UNIT per tablet Take 1 tablet by mouth daily.     No  current facility-administered medications for this visit.    REVIEW OF SYSTEMS:  Review of Systems  Constitutional: Negative.  Negative for appetite change, chills, fatigue, fever and unexpected weight change.  HENT:  Negative.    Eyes: Negative.   Respiratory: Negative.  Negative for chest tightness, cough, hemoptysis, shortness of breath and wheezing.   Cardiovascular: Negative.  Negative for chest pain, leg swelling and palpitations.  Gastrointestinal:  Positive for constipation. Negative for abdominal distention, abdominal pain, blood in stool, diarrhea, nausea and vomiting.  Endocrine: Negative.   Genitourinary: Negative.  Negative for difficulty urinating, dysuria, frequency and hematuria.   Musculoskeletal:  Positive for arthralgias and back pain. Negative for flank pain, gait problem and myalgias.       Pressure of the left supraclavicular area  Skin:        New skin lesions of the left chest  Neurological:  Positive for numbness (neuropathy of the left hand). Negative for dizziness, extremity weakness, gait problem, headaches, light-headedness, seizures and speech difficulty.  Hematological: Negative.   Psychiatric/Behavioral: Negative.  Negative for depression and sleep disturbance. The  patient is not nervous/anxious.      VITALS:  Blood pressure 140/65, pulse 88, temperature 98.2 F (36.8 C), temperature source Oral, resp. rate 16, weight 114 lb (51.7 kg), SpO2 93 %.  Wt Readings from Last 3 Encounters:  06/24/21 114 lb (51.7 kg)  06/12/21 115 lb (52.2 kg)  06/03/21 119 lb 3.2 oz (54.1 kg)    Body mass index is 21.54 kg/m.  Performance status (ECOG): 1 - Symptomatic but completely ambulatory  PHYSICAL EXAM:  Physical Exam Constitutional:      General: She is not in acute distress.    Appearance: Normal appearance. She is normal weight.  HENT:     Head: Normocephalic and atraumatic.  Eyes:     General: No scleral icterus.    Extraocular Movements: Extraocular movements intact.     Conjunctiva/sclera: Conjunctivae normal.     Pupils: Pupils are equal, round, and reactive to light.  Cardiovascular:     Rate and Rhythm: Normal rate and regular rhythm.     Pulses: Normal pulses.     Heart sounds: Normal heart sounds. No murmur heard.   No friction rub. No gallop.  Pulmonary:     Effort: Pulmonary effort is normal. No respiratory distress.     Breath sounds: Normal breath sounds.  Chest:     Comments: She still has a left supraclavicular mass, but this seems smaller. She has many skin lesions, most new, which are raised and scattered over the left shoulder and over the left anterior upper chest. 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 Abdominal:     General: Bowel sounds are normal. There is no distension.     Palpations: Abdomen is soft. There is no hepatomegaly, splenomegaly or mass.     Tenderness: There is no abdominal tenderness.  Musculoskeletal:        General: Normal range of motion.     Cervical back: Normal range of motion and neck supple.     Right lower leg: No edema.     Left lower leg: No edema.     Comments: 1+ lymphedema of the left upper extremity  Lymphadenopathy:      Cervical: No cervical adenopathy.     Upper Body:     Left upper body: Supraclavicular adenopathy (2-3 cm) present.  Skin:  General: Skin is warm and dry.  Neurological:     General: No focal deficit present.     Mental Status: She is alert and oriented to person, place, and time. Mental status is at baseline.  Psychiatric:        Mood and Affect: Mood normal.        Behavior: Behavior normal.        Thought Content: Thought content normal.        Judgment: Judgment normal.    LABS:   CBC Latest Ref Rng & Units 06/24/2021 06/12/2021 06/03/2021  WBC - 6.9 7.2 6.1  Hemoglobin 12.0 - 16.0 13.1 13.7 13.9  Hematocrit 36 - 46 39 41 42  Platelets 150 - 399 254 267 237   CMP Latest Ref Rng & Units 06/24/2021 06/12/2021 06/03/2021  Glucose 70 - 99 mg/dL - - -  BUN 4 - 21 19 14 9   Creatinine 0.5 - 1.1 0.7 0.7 0.7  Sodium 137 - 147 135(A) 137 136(A)  Potassium 3.4 - 5.3 4.6 4.1 4.1  Chloride 99 - 108 102 104 102  CO2 13 - 22 27(A) 24(A) 26(A)  Calcium 8.7 - 10.7 9.5 8.4(A) 9.1  Total Protein 6.5 - 8.1 g/dL - - -  Total Bilirubin 0.3 - 1.2 mg/dL - - -  Alkaline Phos 25 - 125 417(A) 298(A) 294(A)  AST 13 - 35 155(A) 96(A) 148(A)  ALT 7 - 35 46(A) 35 43(A)     Lab Results  Component Value Date   CEA1 1.8 06/03/2021   /  CEA  Date Value Ref Range Status  06/03/2021 1.8 0.0 - 4.7 ng/mL Final    Comment:    (NOTE)                             Nonsmokers          <3.9                             Smokers             <5.6 Roche [employer] Electrochemiluminescence Immunoassay (ECLIA) Values obtained with different assay methods or kits cannot be used interchangeably.  Results cannot be interpreted as absolute evidence of the presence or absence of malignant disease. Performed At: [facility] [place], [place] [id number] Rush Farmer MD UY:[id number]     Lab Results  Component Value Date   LDH 186 11/12/2011   LDH 187 09/08/2010   LDH 171 09/09/2009     STUDIES:  No results found.    I, Rita Ohara, am acting as scribe for [name], MD  I have reviewed this report as typed by the medical scribe, and it is complete and accurate.

## 2021-06-19 ENCOUNTER — Encounter: Payer: Self-pay | Admitting: Oncology

## 2021-06-23 MED FILL — Trastuzumab-Hyaluronidase-oysk Inj 600-10000 MG-Unit/5ML: SUBCUTANEOUS | Qty: 5 | Status: AC

## 2021-06-24 ENCOUNTER — Inpatient Hospital Stay (INDEPENDENT_AMBULATORY_CARE_PROVIDER_SITE_OTHER): Payer: Medicare Other | Admitting: Hematology and Oncology

## 2021-06-24 ENCOUNTER — Inpatient Hospital Stay: Payer: Medicare Other

## 2021-06-24 ENCOUNTER — Encounter: Payer: Self-pay | Admitting: Oncology

## 2021-06-24 ENCOUNTER — Encounter: Payer: Self-pay | Admitting: Hematology and Oncology

## 2021-06-24 ENCOUNTER — Other Ambulatory Visit: Payer: Self-pay | Admitting: Oncology

## 2021-06-24 ENCOUNTER — Other Ambulatory Visit: Payer: Self-pay

## 2021-06-24 ENCOUNTER — Inpatient Hospital Stay (INDEPENDENT_AMBULATORY_CARE_PROVIDER_SITE_OTHER): Payer: Medicare Other | Admitting: Oncology

## 2021-06-24 VITALS — BP 154/60 | HR 88 | Temp 97.8°F | Resp 18 | Ht 61.0 in | Wt 114.0 lb

## 2021-06-24 VITALS — BP 140/65 | HR 88 | Temp 98.2°F | Resp 16 | Wt 114.0 lb

## 2021-06-24 DIAGNOSIS — C7951 Secondary malignant neoplasm of bone: Secondary | ICD-10-CM

## 2021-06-24 DIAGNOSIS — Z5189 Encounter for other specified aftercare: Secondary | ICD-10-CM | POA: Diagnosis not present

## 2021-06-24 DIAGNOSIS — C50412 Malignant neoplasm of upper-outer quadrant of left female breast: Secondary | ICD-10-CM

## 2021-06-24 DIAGNOSIS — Z79899 Other long term (current) drug therapy: Secondary | ICD-10-CM | POA: Diagnosis not present

## 2021-06-24 DIAGNOSIS — C50912 Malignant neoplasm of unspecified site of left female breast: Secondary | ICD-10-CM | POA: Diagnosis not present

## 2021-06-24 DIAGNOSIS — Z171 Estrogen receptor negative status [ER-]: Secondary | ICD-10-CM

## 2021-06-24 DIAGNOSIS — Z5112 Encounter for antineoplastic immunotherapy: Secondary | ICD-10-CM | POA: Diagnosis not present

## 2021-06-24 DIAGNOSIS — Z853 Personal history of malignant neoplasm of breast: Secondary | ICD-10-CM | POA: Diagnosis not present

## 2021-06-24 DIAGNOSIS — C787 Secondary malignant neoplasm of liver and intrahepatic bile duct: Secondary | ICD-10-CM | POA: Diagnosis not present

## 2021-06-24 DIAGNOSIS — I10 Essential (primary) hypertension: Secondary | ICD-10-CM | POA: Diagnosis not present

## 2021-06-24 DIAGNOSIS — I890 Lymphedema, not elsewhere classified: Secondary | ICD-10-CM | POA: Diagnosis not present

## 2021-06-24 DIAGNOSIS — Z85038 Personal history of other malignant neoplasm of large intestine: Secondary | ICD-10-CM | POA: Diagnosis not present

## 2021-06-24 DIAGNOSIS — C7989 Secondary malignant neoplasm of other specified sites: Secondary | ICD-10-CM

## 2021-06-24 DIAGNOSIS — Z9221 Personal history of antineoplastic chemotherapy: Secondary | ICD-10-CM | POA: Diagnosis not present

## 2021-06-24 LAB — CBC AND DIFFERENTIAL
HCT: 39 (ref 36–46)
Hemoglobin: 13.1 (ref 12.0–16.0)
Neutrophils Absolute: 4.69
Platelets: 254 (ref 150–399)
WBC: 6.9

## 2021-06-24 LAB — BASIC METABOLIC PANEL
BUN: 19 (ref 4–21)
CO2: 27 — AB (ref 13–22)
Chloride: 102 (ref 99–108)
Creatinine: 0.7 (ref 0.5–1.1)
Glucose: 115
Potassium: 4.6 (ref 3.4–5.3)
Sodium: 135 — AB (ref 137–147)

## 2021-06-24 LAB — HEPATIC FUNCTION PANEL
ALT: 46 — AB (ref 7–35)
AST: 155 — AB (ref 13–35)
Alkaline Phosphatase: 417 — AB (ref 25–125)
Bilirubin, Total: 0.8

## 2021-06-24 LAB — CBC: RBC: 4.15 (ref 3.87–5.11)

## 2021-06-24 LAB — COMPREHENSIVE METABOLIC PANEL
Albumin: 4 (ref 3.5–5.0)
Calcium: 9.5 (ref 8.7–10.7)

## 2021-06-24 MED ORDER — ACETAMINOPHEN 325 MG PO TABS
650.0000 mg | ORAL_TABLET | Freq: Once | ORAL | Status: AC
  Administered 2021-06-24: 650 mg via ORAL
  Filled 2021-06-24: qty 2

## 2021-06-24 MED ORDER — DEXAMETHASONE 4 MG PO TABS: ORAL_TABLET | ORAL | 0 refills | Status: DC

## 2021-06-24 MED ORDER — TRASTUZUMAB-HYALURONIDASE-OYSK 600-10000 MG-UNT/5ML ~~LOC~~ SOLN
600.0000 mg | Freq: Once | SUBCUTANEOUS | Status: AC
  Administered 2021-06-24: 600 mg via SUBCUTANEOUS
  Filled 2021-06-24: qty 5

## 2021-06-24 MED ORDER — DIPHENHYDRAMINE HCL 25 MG PO CAPS
25.0000 mg | ORAL_CAPSULE | Freq: Once | ORAL | Status: AC
  Administered 2021-06-24: 25 mg via ORAL
  Filled 2021-06-24: qty 1

## 2021-06-24 MED FILL — Pertuzumab Soln for IV Infusion 420 MG/14ML (30 MG/ML): INTRAVENOUS | Qty: 28 | Status: AC

## 2021-06-24 NOTE — Progress Notes (Signed)
1315 pt d/c stable 

## 2021-06-24 NOTE — Progress Notes (Addendum)
Decrease Benadryl to 25mg  PO due to patient age. Change to Neulasta Onpro per MD.  Madaline Brilliant to treat on 06/25/21 with elevated LFT's from 06/23/21.  Raul Del [place], [place], BCPS, BCOP 06/24/2021 3:12 PM

## 2021-06-24 NOTE — Patient Instructions (Signed)
[facility]  Discharge Instructions: Thank you for choosing [facility] to provide your oncology and hematology care.  If you have a lab appointment with the [facility], please go directly to the [facility] and check in at the registration area.   Wear comfortable clothing and clothing appropriate for easy access to any Portacath or PICC line.   We strive to give you quality time with your provider. You may need to reschedule your appointment if you arrive late (15 or more minutes).  Arriving late affects you and other patients whose appointments are after yours.  Also, if you miss three or more appointments without notifying the office, you may be dismissed from the clinic at the providers discretion.      For prescription refill requests, have your pharmacy contact our office and allow 72 hours for refills to be completed.    Today you received the following chemotherapy and/or immunotherapy agents herceptin hyclenta   To help prevent nausea and vomiting after your treatment, we encourage you to take your nausea medication as directed.  BELOW ARE SYMPTOMS THAT SHOULD BE REPORTED IMMEDIATELY: *FEVER GREATER THAN 100.4 F (38 C) OR HIGHER *CHILLS OR SWEATING *NAUSEA AND VOMITING THAT IS NOT CONTROLLED WITH YOUR NAUSEA MEDICATION *UNUSUAL SHORTNESS OF BREATH *UNUSUAL BRUISING OR BLEEDING *URINARY PROBLEMS (pain or burning when urinating, or frequent urination) *BOWEL PROBLEMS (unusual diarrhea, constipation, pain near the anus) TENDERNESS IN MOUTH AND THROAT WITH OR WITHOUT PRESENCE OF ULCERS (sore throat, sores in mouth, or a toothache) UNUSUAL RASH, SWELLING OR PAIN  UNUSUAL VAGINAL DISCHARGE OR ITCHING   Items with * indicate a potential emergency and should be followed up as soon as possible or go to the Emergency Department if any problems should occur.  Please show the CHEMOTHERAPY ALERT CARD or IMMUNOTHERAPY ALERT CARD at check-in to  the Emergency Department and triage nurse.  Should you have questions after your visit or need to cancel or reschedule your appointment, please contact [facility]  Dept: [phone]  and follow the prompts.  Office hours are 8:00 a.m. to 4:30 p.m. Monday - Friday. Please note that voicemails left after 4:00 p.m. may not be returned until the following business day.  We are closed weekends and major holidays. You have access to a nurse at all times for urgent questions. Please call the main number to the clinic Dept: [phone] and follow the prompts.  For any non-urgent questions, you may also contact your provider using MyChart. We now offer e-Visits for anyone 28 and older to request care online for non-urgent symptoms. For details visit [email or web address].   Also download the MyChart app! Go to the app store, search "MyChart", open the app, select [facility], and log in with your MyChart username and password.  Due to Covid, a mask is required upon entering the hospital/clinic. If you do not have a mask, one will be given to you upon arrival. For doctor visits, patients may have 1 support person aged 26 or older with them. For treatment visits, patients cannot have anyone with them due to current Covid guidelines and our immunocompromised population.   Trastuzumab injection for infusion What is this medication? TRASTUZUMAB (tras TOO zoo mab) is a monoclonal antibody. It is used to treat breast cancer and stomach cancer. This medicine may be used for other purposes; ask your health care provider or pharmacist if you have questions. COMMON BRAND NAME(S): Herceptin, Belenda Cruise, Ogivri,  Chalmers Guest What should I tell my care team before I take this medication? They need to know if you have any of these conditions: heart disease heart failure lung or breathing disease, like asthma an unusual or allergic reaction to trastuzumab, benzyl alcohol, or  other medications, foods, dyes, or preservatives pregnant or trying to get pregnant breast-feeding How should I use this medication? This drug is given as an infusion into a vein. It is administered in a hospital or clinic by a specially trained health care professional. Talk to your pediatrician regarding the use of this medicine in children. This medicine is not approved for use in children. Overdosage: If you think you have taken too much of this medicine contact a poison control center or emergency room at once. NOTE: This medicine is only for you. Do not share this medicine with others. What if I miss a dose? It is important not to miss a dose. Call your doctor or health care professional if you are unable to keep an appointment. What may interact with this medication? This medicine may interact with the following medications: certain types of chemotherapy, such as daunorubicin, doxorubicin, epirubicin, and idarubicin This list may not describe all possible interactions. Give your health care provider a list of all the medicines, herbs, non-prescription drugs, or dietary supplements you use. Also tell them if you smoke, drink alcohol, or use illegal drugs. Some items may interact with your medicine. What should I watch for while using this medication? Visit your doctor for checks on your progress. Report any side effects. Continue your course of treatment even though you feel ill unless your doctor tells you to stop. Call your doctor or health care professional for advice if you get a fever, chills or sore throat, or other symptoms of a cold or flu. Do not treat yourself. Try to avoid being around people who are sick. You may experience fever, chills and shaking during your first infusion. These effects are usually mild and can be treated with other medicines. Report any side effects during the infusion to your health care professional. Fever and chills usually do not happen with later  infusions. Do not become pregnant while taking this medicine or for 7 months after stopping it. Women should inform their doctor if they wish to become pregnant or think they might be pregnant. Women of child-bearing potential will need to have a negative pregnancy test before starting this medicine. There is a potential for serious side effects to an unborn child. Talk to your health care professional or pharmacist for more information. Do not breast-feed an infant while taking this medicine or for 7 months after stopping it. Women must use effective birth control with this medicine. What side effects may I notice from receiving this medication? Side effects that you should report to your doctor or health care professional as soon as possible: allergic reactions like skin rash, itching or hives, swelling of the face, lips, or tongue chest pain or palpitations cough dizziness feeling faint or lightheaded, falls fever general ill feeling or flu-like symptoms signs of worsening heart failure like breathing problems; swelling in your legs and feet unusually weak or tired Side effects that usually do not require medical attention (report to your doctor or health care professional if they continue or are bothersome): bone pain changes in taste diarrhea joint pain nausea/vomiting weight loss This list may not describe all possible side effects. Call your doctor for medical advice about side effects. You may report side  effects to FDA at 1-800-FDA-1088. Where should I keep my medication? This drug is given in a hospital or clinic and will not be stored at home. NOTE: This sheet is a summary. It may not cover all possible information. If you have questions about this medicine, talk to your doctor, pharmacist, or health care provider.  2022 Elsevier/Gold Standard (2016-05-11 00:00:00)

## 2021-06-24 NOTE — Progress Notes (Signed)
Foster NOTE  Patient Care Team: [name], MD as PCP - General (Family Medicine) [name], MD as Consulting Physician (Oncology)   Name of the patient: Jasmin Lewis  [id number]  1931/09/15   Date of visit: 06/24/21  Diagnosis- Breast cancer  Chief complaint/Reason for visit- Initial Meeting for [facility], preparing for starting chemotherapy    Heme/Onc history:  Oncology History Overview Note  Cancer Staging Malignant neoplasm of upper-outer quadrant of left breast in female, estrogen receptor negative ([place]) Staging form: Breast, AJCC 8th Edition - Clinical stage from 10/11/2018: Stage IIB (cT2, cN1, cM0, G3, ER-, PR-, HER2+) - Signed by Truitt Merle, MD on 11/02/2018 - Clinical: No stage assigned - Unsigned    History of colon cancer, stage II  09/2005 Initial Diagnosis   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 Findings were a 5.9 x 3.8 x 1.2 cm moderate to well-differentiated adenocarcinoma penetrating the muscular wall. Forty lymph nodes negative. No vascular or lymphatic invasion. Multiple diverticula with microabscess formation and inflammation. Carcinoma present in at least 1 diverticulum. Preop CEA 5.2 with lab normal range 0 to 2.5. Preop CT scan with no obvious additional pathology.    2007 -  Chemotherapy   She received oral Xeloda chemotherapy as an adjuvant. She declined treatment on a clinical trial.    11/12/2011 Initial Diagnosis   [name], stage II   12/09/2011 Procedure   Followup colonoscopy done on 12/09/2011. She was found to have 2 polyps which were removed. Chronic diverticulosis.     Malignant neoplasm of upper-outer quadrant of left breast in female, estrogen receptor negative ([place])  09/26/2018 Mammogram   Mammogram/US of left breast 09/26/18 IMPRESSION:  1. 2.9cm irregular mass in the upper outer left  breast corresponds to the palpable abnormality. This is highly suspicious for breast carcinoma.  2. Two adjacent abnormal left axillary  Lymph nodes suspicious for metastatic adenopathy. There is a third borderline abnormal left axillary LN with a cortex thickened to 78m.  3. Benign right breast cyst. No evidence of right breast malignancy.    10/11/2018 Cancer Staging   Staging form: Breast, AJCC 8th Edition - Clinical stage from 10/11/2018: Stage IIB (cT2, cN1, cM0, G3, ER-, PR-, HER2+) - Signed by [name] MD on 11/02/2018    10/11/2018 Initial Biopsy   Diagnosis 10/11/18 1. Breast, left, needle core biopsy, upper outer left 2 o'clock - INVASIVE DUCTAL CARCINOMA. - DUCTAL CARCINOMA IN SITU. - LYMPHOVASCULAR INVASION IS IDENTIFIED. - SEE COMMENT. 2. Lymph node, needle/core biopsy, left axilla - INVASIVE DUCTAL CARCINOMA. - SEE COMMENT.   10/11/2018 Receptors her2   The tumor cells are POSITIVE for Her2 (3+). Estrogen Receptor: 0%, NEGATIVE Progesterone Receptor: 0%, NEGATIVE Proliferation Marker Ki67: 70%   11/02/2018 Initial Diagnosis   Malignant neoplasm of upper-outer quadrant of left breast in [place], [place]   11/15/2018 Breast MRI   MRI breast 11/15/18  IMPRESSION: 1. 3.3 centimeter mass in the LATERAL portion of the LEFT breast consistent with known malignancy. 2. There is significant non mass enhancement surrounding this mass and extending anteriorly into the nipple base, with largest diameter in the anterior to posterior axis, measuring 7.2 centimeters. 3. If the patient would consider breast conservation, additional MR guided core biopsies are recommended. Consider biopsy of the inferior and anterior extent of the non mass enhancement to document  extent of disease. 4. Three enlarged LEFT axillary lymph nodes. 5. RIGHT breast is negative.   11/15/2018 PET scan   PET 11/15/18 IMPRESSION: Hypermetabolic left breast lesion compatible with known  primary. Hypermetabolic left axillary lymph nodes are consistent with metastatic disease.   No evidence for additional hypermetabolic metastatic involvement in the neck, chest, abdomen, or pelvis.   11/17/2018 - 03/01/2019 Chemotherapy   Neo-adjuvant Kadcyla and perjeta q3weeks for 6 cycles starting 11/17/18. Stopped before surgery    11/29/2018 Pathology Results   Diagnosis 1. Breast, left, needle core biopsy, inferior anterior (cylinder clip) - INVASIVE DUCTAL CARCINOMA. - DUCTAL CARCINOMA IN SITU. - LYMPHOVASCULAR INVASION IS IDENTIFIED. - SEE COMMENT. 2. Breast, left, needle core biopsy, central posterior (barbell clip) - INVASIVE DUCTAL CARCINOMA. - LYMPHOVASCULAR INVASION IS IDENTIFIED.   02/12/2019 Breast MRI   IMPRESSION: 1. Complete resolution of previously identified enhancing mass and associated non mass enhancement within the left breast. This is consistent with excellent response to chemotherapy. No residual or suspicious findings are identified. 2. No MRI evidence of malignancy on the right. 3. Previously identified left axillary lymphadenopathy not definitively seen on today's study. However, this may be due to decreased field-of-view compared to prior study.     03/14/2019 Cancer Staging   Staging form: Breast, AJCC 8th Edition - Pathologic stage from 03/14/2019: pT0, pN0, cM0, GX, ER: Unknown, PR: Unknown, HER2: Not Assessed - Signed by Truitt Merle, MD on 03/28/2019    03/14/2019 Surgery   LEFT MASTECTOMY WITH TARGETED LYMPH NODE DISSECTION and Left Axillary Sentinel Lymph  Node Biopsy by Dr [name] 03/14/19    03/14/2019 Pathology Results   FINAL MICROSCOPIC DIAGNOSIS:   A. LYMPH NODE, LEFT, SENTINEL, BIOPSY:  - There is no evidence of carcinoma in 1 of 1 lymph node (0/1).   B. BREAST, LEFT, MASTECTOMY:  - Benign breast parenchyma with treatment-related changes.  - There is no evidence of malignancy.  - See oncology table below.   C. LYMPH NODE, LEFT #1,  SENTINEL, BIOPSY:  - There is no evidence of carcinoma in 1 of 1 lymph node (0/1).   D. LYMPH NODE, LEFT #2, SENTINEL, BIOPSY:  - There is no evidence of carcinoma in 1 of 1 lymph node (0/1).     04/04/2019 - 04/25/2019 Chemotherapy   Maintenance Herceptin injections every 3 weeks starting 04/03/19 to complete 1 year of treatment that was started in 11/2018. Stopped after 2nd dose as she will not be repeating Echos.    06/03/2021 - 06/24/2021 Chemotherapy   Patient is on Treatment Plan : BREAST Trastuzumab q21d X 11 Cycles     06/25/2021 -  Chemotherapy   Patient is on Treatment Plan : BREAST Docetaxel + Trastuzumab + Pertuzumab (THP) q21d     Liver metastases ([place])  04/17/2021 Imaging   CT ABDOMEN AND PELVIS WITH CONTRAST: -New lytic lesion involving the L2 vertebral body (6:72, 2:26) with minimal cortical breakthrough superiorly, but with preservation of vertebral body height, concerning for metastatic disease.  -There are several indeterminate hepatic lesions as described above. In addition to the lesions described above, there is an additional subtle 1.5 cm hypodensity in the hepatic dome (2:7). Given clinical history and presence of a new L2 lesion, leading differential consideration is metastatic disease. Recommend further evaluation with MRI of the abdomen with and without contrast.   05/12/2021 Imaging   MRI ABDOMEN WITH AND WITHOUT CONTRAST: Numerous heterogeneously enhancing, internally necrotic lesions in the right hepatic lobe, highly concerning for metastatic disease.  Largest lesion measures up to 6.7 cm in hepatic segment VI.   Enhancing osseous lesions in the L2 vertebral body, both iliac bones, and in the sacrum, highly concerning for osseous metastases.   05/15/2021 Initial Diagnosis   Liver metastases ([place])   05/27/2021 PET scan   1. Widespread recurrent/metastatic disease in this patient who is  status post bilateral mastectomy. Left chest wall recurrence with  left  axillary/subpectoral, supraclavicular nodal metastasis.  2. Hepatic, left adrenal, and widespread osseous metastasis.  3. Incidental findings, including: Right nephrolithiasis. Tiny  hiatal hernia.    05/27/2021 Imaging   MRI LUMBAR AND THORACIC SPINE: Thoracic spine:  Osseous metastatic disease involving each level. The most dramatic deposits are at T4 and T6 where there is extraosseous/epidural tumor. No cord compression but there is foraminal impingement on the left at T6-7 and on the right at T3-4. Early dorsal epidural tumor at the level of T10 and T3.   Lumbar spine:  1. Widespread osseous metastatic disease with largest deposit  replacing the L2 body where there is a compression fracture with  mild height loss. Also at this level is early epidural tumor  extension likely affecting both L2-3 foramina.  2. 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    05/27/2021 Imaging   MRI HEAD WITH AND WITHOUT CONTRAST: Negative for metastatic disease to the brain or calvarium.   06/03/2021 - 06/24/2021 Chemotherapy   Patient is on Treatment Plan : BREAST Trastuzumab q21d X 11 Cycles     06/25/2021 -  Chemotherapy   Patient is on Treatment Plan : BREAST Docetaxel + Trastuzumab + Pertuzumab (THP) q21d     Bone metastases ([place])  04/17/2021 Imaging   CT ABDOMEN AND PELVIS WITH CONTRAST: -New lytic lesion involving the L2 vertebral body (6:72, 2:26) with minimal cortical breakthrough superiorly, but with preservation of vertebral body height, concerning for metastatic disease.  -There are several indeterminate hepatic lesions as described above. In addition to the lesions described above, there is an additional subtle 1.5 cm hypodensity in the hepatic dome (2:7). Given clinical history and presence of a new L2 lesion, leading differential consideration is metastatic disease. Recommend further evaluation with MRI of the abdomen with and without contrast.   05/12/2021 Imaging    MRI ABDOMEN WITH AND WITHOUT CONTRAST: Numerous heterogeneously enhancing, internally necrotic lesions in the right hepatic lobe, highly concerning for metastatic disease. Largest lesion measures up to 6.7 cm in hepatic segment VI.   Enhancing osseous lesions in the L2 vertebral body, both iliac bones, and in the sacrum, highly concerning for osseous metastases.   05/15/2021 Initial Diagnosis   Bone metastases ([place])   05/27/2021 PET scan   1. Widespread recurrent/metastatic disease in this patient who is  status post bilateral mastectomy. Left chest wall recurrence with  left axillary/subpectoral, supraclavicular nodal metastasis.  2. Hepatic, left adrenal, and widespread osseous metastasis.  3. Incidental findings, including: Right nephrolithiasis. Tiny  hiatal hernia.    05/27/2021 Imaging   MRI LUMBAR AND THORACIC SPINE: Thoracic spine:  Osseous metastatic disease involving each level. The most dramatic deposits are at T4 and T6 where there is extraosseous/epidural tumor. No cord compression but there is foraminal impingement on the left at T6-7 and on the right at T3-4. Early dorsal epidural tumor at the level of T10 and T3.   Lumbar spine:  1. Widespread osseous metastatic disease with largest deposit  replacing the L2 body where there is a compression fracture with  mild  height loss. Also at this level is early epidural tumor  extension likely affecting both L2-3 foramina.  2. 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    05/27/2021 Imaging   MRI HEAD WITH AND WITHOUT CONTRAST: Negative for metastatic disease to the brain or calvarium.   06/03/2021 - 06/24/2021 Chemotherapy   Patient is on Treatment Plan : BREAST Trastuzumab q21d X 11 Cycles     06/25/2021 -  Chemotherapy   Patient is on Treatment Plan : BREAST Docetaxel + Trastuzumab + Pertuzumab (THP) q21d       Interval history-  Patient presents to chemo care clinic today for initial meeting in  preparation for starting chemotherapy. I introduced the chemo care clinic and we discussed that the role of the clinic is to assist those who are at an increased risk of emergency room visits and/or complications during the course of chemotherapy treatment. We discussed that the increased risk takes into account factors such as age, performance status, and co-morbidities. We also discussed that for some, this might include barriers to care such as not having a primary care provider, lack of insurance/transportation, or not being able to afford medications. We discussed that the goal of the program is to help prevent unplanned ER visits and help reduce complications during chemotherapy. We do this by discussing specific risk factors to each individual and identifying ways that we can help improve these risk factors and reduce barriers to care.   Not on File  Past Medical History:  Diagnosis Date   Arthritis    shoulder   Breast cancer ([place])    Cancer ([place])    left breast ca   Colon cancer ([place])    [name], stage II 11/12/2011   Sigmoid lesion 5.9 cm  40 nodes negative but focus of cancer in a diverticum   Pre-op CEA 5.2 with lab normal up to 2.5 resected 09/21/05   Xeloda adjuvant chemotherapy   Hypertension     Past Surgical History:  Procedure Laterality Date   COLONOSCOPY     HEMICOLECTOMY  2007   MASTECTOMY WITH AXILLARY LYMPH NODE DISSECTION Left 03/14/2019   Procedure: LEFT MASTECTOMY WITH TARGETED LYMPH NODE DISSECTION;  Surgeon: Jovita Kussmaul, MD;  Location: [facility];  Service: General;  Laterality: Left;   SENTINEL NODE BIOPSY Left 03/14/2019   Procedure: Left Axillary Sentinel Lymph  Node Biopsy;  Surgeon: Jovita Kussmaul, MD;  Location: [place];  Service: General;  Laterality: Left;   TONSILLECTOMY     WISDOM TOOTH EXTRACTION      Social History   Socioeconomic History   Marital status: Married    Spouse name: Not on file   Number of children: 3   Years of education: Not  on file   Highest education level: Not on file  Occupational History   Not on file  Tobacco Use   Smoking status: Never   Smokeless tobacco: Never  Vaping Use   Vaping Use: Never used  Substance and Sexual Activity   Alcohol use: Never   Drug use: Never   Sexual activity: Not on file  Other Topics Concern   Not on file  Social History Narrative   Not on file   Social Determinants of Health   Financial Resource Strain: Not on file  Food Insecurity: Not on file  Transportation Needs: Not on file  Physical Activity: Not on file  Stress: Not on file  Social Connections: Not on file  Intimate  Partner Violence: Not on file    Family History  Problem Relation Age of Onset   Cancer Maternal Aunt        colon cancer      Current Outpatient Medications:    dexamethasone (DECADRON) 4 MG tablet, Take 5 tabs at the night before and 5 tab the morning of chemotherapy, every 3 weeks, by mouth, Disp: 60 tablet, Rfl: 0   calcium citrate-vitamin D (CITRACAL+D) 315-200 MG-UNIT per tablet, Take 1 tablet by mouth daily., Disp: , Rfl:    fish oil-omega-3 fatty acids 1000 MG capsule, Take 1 g by mouth daily., Disp: , Rfl:    ondansetron (ZOFRAN) 4 MG tablet, Take 1 tablet (4 mg total) by mouth every 8 (eight) hours as needed for nausea or vomiting., Disp: 20 tablet, Rfl: 2   oxyCODONE (OXY IR/ROXICODONE) 5 MG immediate release tablet, Take 1 tablet (5 mg total) by mouth every 4 (four) hours as needed., Disp: 100 tablet, Rfl: 0   polyethylene glycol (MIRALAX / GLYCOLAX) 17 g packet, Take 17 g by mouth as needed. constipation, Disp: , Rfl:    senna (SENOKOT) 8.6 MG TABS tablet, Take 1 tablet by mouth 2 (two) times daily., Disp: , Rfl:   CMP Latest Ref Rng & Units 06/24/2021  Glucose 70 - 99 mg/dL -  BUN 4 - 21 19  Creatinine 0.5 - 1.1 0.7  Sodium 137 - 147 135(A)  Potassium 3.4 - 5.3 4.6  Chloride 99 - 108 102  CO2 13 - 22 27(A)  Calcium 8.7 - 10.7 9.5  Total Protein 6.5 - 8.1 g/dL -   Total Bilirubin 0.3 - 1.2 mg/dL -  Alkaline Phos 25 - 125 417(A)  AST 13 - 35 155(A)  ALT 7 - 35 46(A)   CBC Latest Ref Rng & Units 06/24/2021  WBC - 6.9  Hemoglobin 12.0 - 16.0 13.1  Hematocrit 36 - 46 39  Platelets 150 - 399 254    No images are attached to the encounter.  No results found.   Assessment and plan- Patient is a 88 y.o. female who presents to [facility] for initial meeting in preparation for starting chemotherapy for the treatment of breast cancer.   Chemo Care Clinic/High Risk for ER/Hospitalization during chemotherapy- We discussed the role of the chemo care clinic and identified patient specific risk factors. I discussed that patient was identified as high risk primarily based on:  Patient has past medical history positive for: Past Medical History:  Diagnosis Date   Arthritis    shoulder   Breast cancer ([place])    Cancer ([place])    left breast ca   Colon cancer ([place])    [name], stage II 11/12/2011   Sigmoid lesion 5.9 cm  40 nodes negative but focus of cancer in a diverticum   Pre-op CEA 5.2 with lab normal up to 2.5 resected 09/21/05   Xeloda adjuvant chemotherapy   Hypertension     Patient has past surgical history positive for: Past Surgical History:  Procedure Laterality Date   COLONOSCOPY     HEMICOLECTOMY  2007   MASTECTOMY WITH AXILLARY LYMPH NODE DISSECTION Left 03/14/2019   Procedure: LEFT MASTECTOMY WITH TARGETED LYMPH NODE DISSECTION;  Surgeon: Jovita Kussmaul, MD;  Location: [place];  Service: General;  Laterality: Left;   SENTINEL NODE BIOPSY Left 03/14/2019   Procedure: Left Axillary Sentinel Lymph  Node Biopsy;  Surgeon: Jovita Kussmaul, MD;  Location: [place];  Service: General;  Laterality: Left;   TONSILLECTOMY     WISDOM TOOTH EXTRACTION     Provided general information including the following: 1.  Date of education: 06/24/2021 2.  Physician name: Dr. Hinton Rao 3.  Diagnosis: Breast Cancer 4.  Stage: 5.  Palliative 6.   Chemotherapy plan including drugs and how often: Docetaxel, Pertuzumab 7.  Start date: 06/25/2021 8.  Other referrals: None at this time 9.  The patient is to call our office with any questions or concerns.  Our office number [phone], if after hours or on the weekend, call the same number and wait for the answering service.  There is always an oncologist on call 10.  Medications prescribed: Dexamethasone 11.  The patient has verbalized understanding of the treatment plan and has no barriers to adherence or understanding.  Obtained signed consent from patient.  Discussed symptoms including 1.  Low blood counts including red blood cells, white blood cells and platelets. 2. Infection including to avoid large crowds, wash hands frequently, and stay away from people who were sick.  If fever develops of 100.4 or higher, call our office. 3.  Mucositis-given instructions on mouth rinse (baking soda and salt mixture).  Keep mouth clean.  Use soft bristle toothbrush.  If mouth sores develop, call our clinic. 4.  Nausea/vomiting-gave prescriptions for ondansetron 4 mg every 4 hours as needed for nausea, may take around the clock if persistent.  Compazine 10 mg every 6 hours, may take around the clock if persistent. 5.  Diarrhea-use over-the-counter Imodium.  Call clinic if not controlled. 6.  Constipation-use senna, 1 to 2 tablets twice a day.  If no BM in 2 to 3 days call the clinic. 7.  Loss of appetite-try to eat small meals every 2-3 hours.  Call clinic if not eating. 8.  Taste changes-zinc 500 mg daily.  If becomes severe call clinic. 9.  Alcoholic beverages. 10.  Drink 2 to 3 quarts of water per day. 11.  Peripheral neuropathy-patient to call if numbness or tingling in hands or feet is persistent  Neulasta-will be given 24 to 48 hours after chemotherapy.  Gave information sheet on bone and joint pain.  Use Claritin or Pepcid.  May use ibuprofen or Aleve.  Call if symptoms persist or are  unbearable.  Gave information on the supportive care team and how to contact them regarding services.  Discussed advanced directives.  The patient does not have their advanced directives but will look at the copy provided in their notebook and will call with any questions. Spiritual Nutrition Financial Social worker Advanced directives  Answered questions to patient satisfaction.  Patient is to call with any further questions or concerns.   The medication prescribed to the patient will be printed out from [email or web address] This will give the following information: Name of your medication Approved uses Dose and schedule Storage and handling Handling body fluids and waste Drug and food interactions Possible side effects and management Pregnancy, sexual activity, and contraception Obtaining medication   We discussed that social determinants of health may have significant impacts on health and outcomes for cancer patients.  Today we discussed specific social determinants of performance status, alcohol use, depression, financial needs, food insecurity, housing, interpersonal violence, social connections, stress, tobacco use, and transportation.    After lengthy discussion the following were identified as areas of need:   Outpatient services: We discussed options including home based and outpatient services, DME and care program. We discusssed that patients who participate in regular physical  activity report fewer negative impacts of cancer and treatments and report less fatigue.   Financial Concerns: We discussed that living with cancer can create tremendous financial burden.  We discussed options for assistance. I asked that if assistance is needed in affording medications or paying bills to please let us know so that we can provide assistance. We discussed options for food including social services and onsite food pantry.  We will also notify Mort Sawyers to see if cancer center can  provide additional support.  Referral to Social work: Introduced Education officer, museum Mort Sawyers and the services she can provide such as support with utility bill, cell phone and gas vouchers.   Support groups: We discussed options for support groups at the cancer center. If interested, please notify nurse navigator to enroll. We discussed options for managing stress including healthy eating, exercise as well as participating in no charge counseling services at the cancer center and support groups.  If these are of interest, patient can notify either myself or primary nursing team.We discussed options for management including medications and referral to quit Smart program  Transportation: We discussed options for transportation including [employer], paratransit, bus routes, link transit, taxi/uber/lyft, and cancer center van.  I have notified primary oncology team who will help assist with arranging Lucianne Lei transportation for appointments when/if needed. We also discussed options for transportation on short notice/acute visits.  Palliative care services: We have palliative care services available in the cancer center to discuss goals of care and advanced care planning.  Please let us know if you have any questions or would like to speak to our palliative nurse practitioner.  Symptom Management Clinic: We discussed our symptom management clinic which is available for acute concerns while receiving treatment such as nausea, vomiting or diarrhea.  We can be reached via telephone at [phone] or through my chart.  We are available for virtual or in person visits on the same day from 830 to 4 PM Monday through Friday. She denies needing specific assistance at this time and She will be followed by Dr. Hinton Rao clinical team.  Plan: Discussed symptom management clinic. Discussed palliative care services. Discussed resources that are available here at the cancer center. Discussed medications and new prescriptions to  begin treatment such as anti-nausea or steroids.   Disposition: RTC on   Visit Diagnosis No diagnosis found.  Patient expressed understanding and was in agreement with this plan. She also understands that She can call clinic at any time with any questions, concerns, or complaints.   I provided 30 minutes of  face to face  during this encounter, and > 50% was spent counseling as documented under my assessment & plan.   Dayton Scrape, FNP- [facility]

## 2021-06-24 NOTE — Progress Notes (Signed)
DISCONTINUE ON PATHWAY REGIMEN - Breast     A cycle is every 21 days:     Trastuzumab-xxxx   **Always confirm dose/schedule in your pharmacy ordering system**  REASON: Disease Progression PRIOR TREATMENT: [id number]: Trastuzumab q21 Days to Complete One Year of Therapy TREATMENT RESPONSE: Progressive Disease (PD)  START OFF PATHWAY REGIMEN - Breast   [id number]:THP (Docetaxel 75 mg/m2 IV D1 + Trastuzumab 8/6 mg/kg IV D1 + Pertuzumab 840/420 mg IV D1) q21 Days:   Cycle 1: A cycle is 21 days:     Pertuzumab      Trastuzumab-xxxx      Docetaxel    Cycles 2 and beyond: A cycle is every 21 days:     Pertuzumab      Trastuzumab-xxxx      Docetaxel   **Always confirm dose/schedule in your pharmacy ordering system**  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

## 2021-06-25 ENCOUNTER — Encounter: Payer: Self-pay | Admitting: Oncology

## 2021-06-25 ENCOUNTER — Inpatient Hospital Stay: Payer: Medicare Other

## 2021-06-25 VITALS — BP 117/57 | HR 70 | Temp 98.7°F | Resp 18 | Ht 61.0 in | Wt 114.0 lb

## 2021-06-25 DIAGNOSIS — Z5112 Encounter for antineoplastic immunotherapy: Secondary | ICD-10-CM | POA: Diagnosis not present

## 2021-06-25 DIAGNOSIS — C50412 Malignant neoplasm of upper-outer quadrant of left female breast: Secondary | ICD-10-CM

## 2021-06-25 DIAGNOSIS — C787 Secondary malignant neoplasm of liver and intrahepatic bile duct: Secondary | ICD-10-CM

## 2021-06-25 DIAGNOSIS — C7951 Secondary malignant neoplasm of bone: Secondary | ICD-10-CM

## 2021-06-25 MED ORDER — SODIUM CHLORIDE 0.9% FLUSH
10.0000 mL | INTRAVENOUS | Status: DC | PRN
  Administered 2021-06-25: 10 mL

## 2021-06-25 MED ORDER — SODIUM CHLORIDE 0.9 % IV SOLN
60.0000 mg/m2 | Freq: Once | INTRAVENOUS | Status: AC
  Administered 2021-06-25: 90 mg via INTRAVENOUS
  Filled 2021-06-25: qty 9

## 2021-06-25 MED ORDER — DIPHENHYDRAMINE HCL 25 MG PO CAPS
25.0000 mg | ORAL_CAPSULE | Freq: Once | ORAL | Status: AC
  Administered 2021-06-25: 25 mg via ORAL
  Filled 2021-06-25: qty 1

## 2021-06-25 MED ORDER — HEPARIN SOD (PORK) LOCK FLUSH 100 UNIT/ML IV SOLN
500.0000 [IU] | Freq: Once | INTRAVENOUS | Status: AC | PRN
  Administered 2021-06-25: 500 [IU]

## 2021-06-25 MED ORDER — ACETAMINOPHEN 325 MG PO TABS
650.0000 mg | ORAL_TABLET | Freq: Once | ORAL | Status: AC
  Administered 2021-06-25: 650 mg via ORAL
  Filled 2021-06-25: qty 2

## 2021-06-25 MED ORDER — SODIUM CHLORIDE 0.9 % IV SOLN
10.0000 mg | Freq: Once | INTRAVENOUS | Status: AC
  Administered 2021-06-25: 10 mg via INTRAVENOUS
  Filled 2021-06-25: qty 10

## 2021-06-25 MED ORDER — SODIUM CHLORIDE 0.9 % IV SOLN: Freq: Once | INTRAVENOUS | Status: AC

## 2021-06-25 MED ORDER — SODIUM CHLORIDE 0.9 % IV SOLN
840.0000 mg | Freq: Once | INTRAVENOUS | Status: AC
  Administered 2021-06-25: 840 mg via INTRAVENOUS
  Filled 2021-06-25: qty 28

## 2021-06-25 MED ORDER — PEGFILGRASTIM 6 MG/0.6ML ~~LOC~~ PSKT: 6.0000 mg | PREFILLED_SYRINGE | Freq: Once | SUBCUTANEOUS | Status: DC

## 2021-06-25 NOTE — Progress Notes (Signed)
Patient prefers regular g-csf injection. Neulasta Onpro orders deleted. Udenyca added for Day 2.  Raul [place], [place], BCPS, BCOP 06/25/2021 12:58 PM

## 2021-06-25 NOTE — Patient Instructions (Signed)
[facility]  Discharge Instructions: Thank you for choosing [facility] to provide your oncology and hematology care.  If you have a lab appointment with the [facility], please go directly to the [facility] and check in at the registration area.   Wear comfortable clothing and clothing appropriate for easy access to any Portacath or PICC line.   We strive to give you quality time with your provider. You may need to reschedule your appointment if you arrive late (15 or more minutes).  Arriving late affects you and other patients whose appointments are after yours.  Also, if you miss three or more appointments without notifying the office, you may be dismissed from the clinic at the providers discretion.      For prescription refill requests, have your pharmacy contact our office and allow 72 hours for refills to be completed.    Today you received the following chemotherapy and/or immunotherapy agents perjeta/taxotere   To help prevent nausea and vomiting after your treatment, we encourage you to take your nausea medication as directed.  BELOW ARE SYMPTOMS THAT SHOULD BE REPORTED IMMEDIATELY: *FEVER GREATER THAN 100.4 F (38 C) OR HIGHER *CHILLS OR SWEATING *NAUSEA AND VOMITING THAT IS NOT CONTROLLED WITH YOUR NAUSEA MEDICATION *UNUSUAL SHORTNESS OF BREATH *UNUSUAL BRUISING OR BLEEDING *URINARY PROBLEMS (pain or burning when urinating, or frequent urination) *BOWEL PROBLEMS (unusual diarrhea, constipation, pain near the anus) TENDERNESS IN MOUTH AND THROAT WITH OR WITHOUT PRESENCE OF ULCERS (sore throat, sores in mouth, or a toothache) UNUSUAL RASH, SWELLING OR PAIN  UNUSUAL VAGINAL DISCHARGE OR ITCHING   Items with * indicate a potential emergency and should be followed up as soon as possible or go to the Emergency Department if any problems should occur.  Please show the CHEMOTHERAPY ALERT CARD or IMMUNOTHERAPY ALERT CARD at check-in to the  Emergency Department and triage nurse.  Should you have questions after your visit or need to cancel or reschedule your appointment, please contact [facility]  Dept: [phone]  and follow the prompts.  Office hours are 8:00 a.m. to 4:30 p.m. Monday - Friday. Please note that voicemails left after 4:00 p.m. may not be returned until the following business day.  We are closed weekends and major holidays. You have access to a nurse at all times for urgent questions. Please call the main number to the clinic Dept: [phone] and follow the prompts.  For any non-urgent questions, you may also contact your provider using MyChart. We now offer e-Visits for anyone 10 and older to request care online for non-urgent symptoms. For details visit [email or web address].   Also download the MyChart app! Go to the app store, search "MyChart", open the app, select , and log in with your MyChart username and password.  Due to Covid, a mask is required upon entering the hospital/clinic. If you do not have a mask, one will be given to you upon arrival. For doctor visits, patients may have 1 support person aged 29 or older with them. For treatment visits, patients cannot have anyone with them due to current Covid guidelines and our immunocompromised population.   Docetaxel injection What is this medication? DOCETAXEL (doe se TAX el) is a chemotherapy drug. It targets fast dividing cells, like cancer cells, and causes these cells to die. This medicine is used to treat many types of cancers like breast cancer, certain stomach cancers, head and neck cancer, lung cancer, and prostate cancer.  This medicine may be used for other purposes; ask your health care provider or pharmacist if you have questions. COMMON BRAND NAME(S): Docefrez, Taxotere What should I tell my care team before I take this medication? They need to know if you have any of these conditions: infection (especially  a virus infection such as chickenpox, cold sores, or herpes) liver disease low blood counts, like low white cell, platelet, or red cell counts an unusual or allergic reaction to docetaxel, polysorbate 80, other chemotherapy agents, other medicines, foods, dyes, or preservatives pregnant or trying to get pregnant breast-feeding How should I use this medication? This drug is given as an infusion into a vein. It is administered in a hospital or clinic by a specially trained health care professional. Talk to your pediatrician regarding the use of this medicine in children. Special care may be needed. Overdosage: If you think you have taken too much of this medicine contact a poison control center or emergency room at once. NOTE: This medicine is only for you. Do not share this medicine with others. What if I miss a dose? It is important not to miss your dose. Call your doctor or health care professional if you are unable to keep an appointment. What may interact with this medication? Do not take this medicine with any of the following medications: live virus vaccines This medicine may also interact with the following medications: aprepitant certain antibiotics like erythromycin or clarithromycin certain antivirals for HIV or hepatitis certain medicines for fungal infections like fluconazole, itraconazole, ketoconazole, posaconazole, or voriconazole cimetidine ciprofloxacin conivaptan cyclosporine dronedarone fluvoxamine grapefruit juice imatinib verapamil This list may not describe all possible interactions. Give your health care provider a list of all the medicines, herbs, non-prescription drugs, or dietary supplements you use. Also tell them if you smoke, drink alcohol, or use illegal drugs. Some items may interact with your medicine. What should I watch for while using this medication? Your condition will be monitored carefully while you are receiving this medicine. You will need  important blood work done while you are taking this medicine. Call your doctor or health care professional for advice if you get a fever, chills or sore throat, or other symptoms of a cold or flu. Do not treat yourself. This drug decreases your body's ability to fight infections. Try to avoid being around people who are sick. Some products may contain alcohol. Ask your health care professional if this medicine contains alcohol. Be sure to tell all health care professionals you are taking this medicine. Certain medicines, like metronidazole and disulfiram, can cause an unpleasant reaction when taken with alcohol. The reaction includes flushing, headache, nausea, vomiting, sweating, and increased thirst. The reaction can last from 30 minutes to several hours. You may get drowsy or dizzy. Do not drive, use machinery, or do anything that needs mental alertness until you know how this medicine affects you. Do not stand or sit up quickly, especially if you are an older patient. This reduces the risk of dizzy or fainting spells. Alcohol may interfere with the effect of this medicine. Talk to your health care professional about your risk of cancer. You may be more at risk for certain types of cancer if you take this medicine. Do not become pregnant while taking this medicine or for 6 months after stopping it. Women should inform their doctor if they wish to become pregnant or think they might be pregnant. There is a potential for serious side effects to an unborn child. Talk to  your health care professional or pharmacist for more information. Do not breast-feed an infant while taking this medicine or for 1 week after stopping it. Males who get this medicine must use a condom during sex with females who can get pregnant. If you get a woman pregnant, the baby could have birth defects. The baby could die before they are born. You will need to continue wearing a condom for 3 months after stopping the medicine. Tell your  health care provider right away if your partner becomes pregnant while you are taking this medicine. This may interfere with the ability to father a child. You should talk to your doctor or health care professional if you are concerned about your fertility. What side effects may I notice from receiving this medication? Side effects that you should report to your doctor or health care professional as soon as possible: allergic reactions like skin rash, itching or hives, swelling of the face, lips, or tongue blurred vision breathing problems changes in vision low blood counts - This drug may decrease the number of white blood cells, red blood cells and platelets. You may be at increased risk for infections and bleeding. nausea and vomiting pain, redness or irritation at site where injected pain, tingling, numbness in the hands or feet redness, blistering, peeling, or loosening of the skin, including inside the mouth signs of decreased platelets or bleeding - bruising, pinpoint red spots on the skin, black, tarry stools, nosebleeds signs of decreased red blood cells - unusually weak or tired, fainting spells, lightheadedness signs of infection - fever or chills, cough, sore throat, pain or difficulty passing urine swelling of the ankle, feet, hands Side effects that usually do not require medical attention (report to your doctor or health care professional if they continue or are bothersome): constipation diarrhea fingernail or toenail changes hair loss loss of appetite mouth sores muscle pain This list may not describe all possible side effects. Call your doctor for medical advice about side effects. You may report side effects to FDA at 1-800-FDA-1088. Where should I keep my medication? This drug is given in a hospital or clinic and will not be stored at home. NOTE: This sheet is a summary. It may not cover all possible information. If you have questions about this medicine, talk to your  doctor, pharmacist, or health care provider.  2022 Elsevier/Gold Standard (2021-01-13 00:00:00) Pertuzumab injection What is this medication? PERTUZUMAB (per TOOZ ue mab) is a monoclonal antibody. It is used to treat breast cancer. This medicine may be used for other purposes; ask your health care provider or pharmacist if you have questions. COMMON BRAND NAME(S): PERJETA What should I tell my care team before I take this medication? They need to know if you have any of these conditions: heart disease heart failure high blood pressure history of irregular heart beat recent or ongoing radiation therapy an unusual or allergic reaction to pertuzumab, other medicines, foods, dyes, or preservatives pregnant or trying to get pregnant breast-feeding How should I use this medication? This medicine is for infusion into a vein. It is given by a health care professional in a hospital or clinic setting. Talk to your pediatrician regarding the use of this medicine in children. Special care may be needed. Overdosage: If you think you have taken too much of this medicine contact a poison control center or emergency room at once. NOTE: This medicine is only for you. Do not share this medicine with others. What if I miss a dose?  It is important not to miss your dose. Call your doctor or health care professional if you are unable to keep an appointment. What may interact with this medication? Interactions are not expected. Give your health care provider a list of all the medicines, herbs, non-prescription drugs, or dietary supplements you use. Also tell them if you smoke, drink alcohol, or use illegal drugs. Some items may interact with your medicine. This list may not describe all possible interactions. Give your health care provider a list of all the medicines, herbs, non-prescription drugs, or dietary supplements you use. Also tell them if you smoke, drink alcohol, or use illegal drugs. Some items may  interact with your medicine. What should I watch for while using this medication? Your condition will be monitored carefully while you are receiving this medicine. Report any side effects. Continue your course of treatment even though you feel ill unless your doctor tells you to stop. Do not become pregnant while taking this medicine or for 7 months after stopping it. Women should inform their doctor if they wish to become pregnant or think they might be pregnant. Women of child-bearing potential will need to have a negative pregnancy test before starting this medicine. There is a potential for serious side effects to an unborn child. Talk to your health care professional or pharmacist for more information. Do not breast-feed an infant while taking this medicine or for 7 months after stopping it. Women must use effective birth control with this medicine. Call your doctor or health care professional for advice if you get a fever, chills or sore throat, or other symptoms of a cold or flu. Do not treat yourself. Try to avoid being around people who are sick. You may experience fever, chills, and headache during the infusion. Report any side effects during the infusion to your health care professional. What side effects may I notice from receiving this medication? Side effects that you should report to your doctor or health care professional as soon as possible: breathing problems chest pain or palpitations dizziness feeling faint or lightheaded fever or chills skin rash, itching or hives sore throat swelling of the face, lips, or tongue swelling of the legs or ankles unusually weak or tired Side effects that usually do not require medical attention (report to your doctor or health care professional if they continue or are bothersome): diarrhea hair loss nausea, vomiting tiredness This list may not describe all possible side effects. Call your doctor for medical advice about side effects. You may  report side effects to FDA at 1-800-FDA-1088. Where should I keep my medication? This drug is given in a hospital or clinic and will not be stored at home. NOTE: This sheet is a summary. It may not cover all possible information. If you have questions about this medicine, talk to your doctor, pharmacist, or health care provider.  2022 Elsevier/Gold Standard (2015-05-29 00:00:00) Pegfilgrastim Injection What is this medication? PEGFILGRASTIM (PEG fil gra stim) lowers the risk of infection in people who are receiving chemotherapy. It works by Building control surveyor make more white blood cells, which protects your body from infection. It may also be used to help people who have been exposed to high doses of radiation. This medicine may be used for other purposes; ask your health care provider or pharmacist if you have questions. COMMON BRAND NAME(S): Rexene Edison, Ziextenzo What should I tell my care team before I take this medication? They need to know if you have any of  these conditions: Kidney disease Latex allergy Ongoing radiation therapy Sickle cell disease Skin reactions to acrylic adhesives (On-Body Injector only) An unusual or allergic reaction to pegfilgrastim, filgrastim, other medications, foods, dyes, or preservatives Pregnant or trying to get pregnant Breast-feeding How should I use this medication? This medication is for injection under the skin. If you get this medication at home, you will be taught how to prepare and give the pre-filled syringe or how to use the On-body Injector. Refer to the patient Instructions for Use for detailed instructions. Use exactly as directed. Tell your care team immediately if you suspect that the On-body Injector may not have performed as intended or if you suspect the use of the On-body Injector resulted in a missed or partial dose. It is important that you put your used needles and syringes in a special sharps container. Do not  put them in a trash can. If you do not have a sharps container, call your pharmacist or care team to get one. Talk to your care team about the use of this medication in children. While this medication may be prescribed for selected conditions, precautions do apply. Overdosage: If you think you have taken too much of this medicine contact a poison control center or emergency room at once. NOTE: This medicine is only for you. Do not share this medicine with others. What if I miss a dose? It is important not to miss your dose. Call your care team if you miss your dose. If you miss a dose due to an On-body Injector failure or leakage, a new dose should be administered as soon as possible using a single prefilled syringe for manual use. What may interact with this medication? Interactions have not been studied. This list may not describe all possible interactions. Give your health care provider a list of all the medicines, herbs, non-prescription drugs, or dietary supplements you use. Also tell them if you smoke, drink alcohol, or use illegal drugs. Some items may interact with your medicine. What should I watch for while using this medication? Your condition will be monitored carefully while you are receiving this medication. You may need blood work done while you are taking this medication. Talk to your care team about your risk of cancer. You may be more at risk for certain types of cancer if you take this medication. If you are going to need a MRI, CT scan, or other procedure, tell your care team that you are using this medication (On-Body Injector only). What side effects may I notice from receiving this medication? Side effects that you should report to your care team as soon as possible: Allergic reactions--skin rash, itching, hives, swelling of the face, lips, tongue, or throat Capillary leak syndrome--stomach or muscle pain, unusual weakness or fatigue, feeling faint or lightheaded, decrease in  the amount of urine, swelling of the ankles, hands, or feet, trouble breathing High white blood cell level--fever, fatigue, trouble breathing, night sweats, change in vision, weight loss Inflammation of the aorta--fever, fatigue, back, chest, or stomach pain, severe headache Kidney injury (glomerulonephritis)--decrease in the amount of urine, red or dark brown urine, foamy or bubbly urine, swelling of the ankles, hands, or feet Shortness of breath or trouble breathing Spleen injury--pain in upper left stomach or shoulder Unusual bruising or bleeding Side effects that usually do not require medical attention (report to your care team if they continue or are bothersome): Bone pain Pain in the hands or feet This list may not describe all possible  side effects. Call your doctor for medical advice about side effects. You may report side effects to FDA at 1-800-FDA-1088. Where should I keep my medication? Keep out of the reach of children. If you are using this medication at home, you will be instructed on how to store it. Throw away any unused medication after the expiration date on the label. NOTE: This sheet is a summary. It may not cover all possible information. If you have questions about this medicine, talk to your doctor, pharmacist, or health care provider.  2022 Elsevier/Gold Standard (2021-01-13 00:00:00)

## 2021-06-26 ENCOUNTER — Other Ambulatory Visit: Payer: Self-pay | Admitting: Pharmacist

## 2021-06-26 ENCOUNTER — Inpatient Hospital Stay: Payer: Medicare Other

## 2021-06-26 ENCOUNTER — Other Ambulatory Visit: Payer: Self-pay

## 2021-06-26 VITALS — BP 157/60 | HR 100 | Temp 98.6°F | Resp 18

## 2021-06-26 DIAGNOSIS — C50412 Malignant neoplasm of upper-outer quadrant of left female breast: Secondary | ICD-10-CM

## 2021-06-26 DIAGNOSIS — C787 Secondary malignant neoplasm of liver and intrahepatic bile duct: Secondary | ICD-10-CM

## 2021-06-26 DIAGNOSIS — C7951 Secondary malignant neoplasm of bone: Secondary | ICD-10-CM

## 2021-06-26 DIAGNOSIS — Z5112 Encounter for antineoplastic immunotherapy: Secondary | ICD-10-CM | POA: Diagnosis not present

## 2021-06-26 MED ORDER — PEGFILGRASTIM-CBQV 6 MG/0.6ML ~~LOC~~ SOSY
6.0000 mg | PREFILLED_SYRINGE | Freq: Once | SUBCUTANEOUS | Status: AC
  Administered 2021-06-26: 6 mg via SUBCUTANEOUS
  Filled 2021-06-26: qty 0.6

## 2021-06-26 NOTE — Patient Instructions (Signed)

## 2021-06-29 ENCOUNTER — Telehealth: Payer: Self-pay

## 2021-06-29 ENCOUNTER — Other Ambulatory Visit: Payer: Self-pay | Admitting: Hematology and Oncology

## 2021-06-29 MED ORDER — TELFA NON-ADHERENT 3"X4" PADS
1.0000 | MEDICATED_PAD | Freq: Two times a day (BID) | 0 refills | Status: DC
Start: 2021-06-29 — End: 2021-10-07

## 2021-06-29 NOTE — Telephone Encounter (Signed)
Jasmin Lewis called wanting to if there was anything that he can get or can get prescribed for his mother. She has some skin lesions from her breast cancer that is bothering her especially when she is wearing her clothing and gets caught or either any recommendations.

## 2021-06-29 NOTE — Progress Notes (Signed)
[facility]  [address] [place],  [place]  [address] [phone]  Clinic Day:  07/03/2021  Referring physician: [name], MD  This document serves as a record of services personally performed by Jasmin Poisson, MD. It was created on their behalf by Jasmin Lewis, a trained medical scribe. The creation of this record is based on the scribe's personal observations and the provider's statements to them.  ASSESSMENT & PLAN:   Stage IIB (T2c N1 M0) HER2 receptor positive left breast cancer, diagnosed in June 2020. This was ER/PR negative. This was treated with neoadjuvant Kadcyla/Perjeta and left mastectomy, with a complete response in breast and nodes. Jasmin Lewis had only received two doses of adjuvant Herceptin before being discontinued in December 2020.  History of stage II colon cancer, diagnosed in May 2007. This was treated with surgical resection and adjuvant chemotherapy with Xeloda. Colonoscopy from 2013 revealed 2 polyps which were resected and chronic diverticulosis.  Liver metastases discovered 04/17/21.  Numerous heterogeneously enhancing, internally necrotic lesions in the right hepatic lobe, consistent with metastatic disease. Largest lesion measures up to 6.7 cm in hepatic segment VI, and there are at least 6 lesions. We now have biopsy proven HER2 positive recurrent breast cancer with ER/PR negative. Jasmin Lewis is now receiving THP and is tolerating without significant difficulty. Jasmin Lewis has improvement in Jasmin Lewis liver function tests after 1 cycle.  Bone metastases discovered 04/17/21. Enhancing osseous lesions in the L2 vertebral body, both iliac bones, and in the sacrum, highly concerning for osseous metastases. Jasmin Lewis received Jasmin Lewis 1st dose of zoledronic acid on January 16th.  Left supraclavicular lymph node measuring 2-3 cm. Also involvement of subpectoral node and chest wall skin. This is now rapidly progressive despite receiving Jasmin Lewis 1st dose of trastuzumab.   We have added in Perjeta to Jasmin Lewis current HER2 targeted therapy as well as chemotherapy with Taxotere.  Lymphedema of the left upper extremity. Jasmin Lewis continues to follow with Jasmin Lewis, the lymphedema specialist.   Hypercalcemia, improved following zoledronic acid on January 16th.  Numbness and paresthesias of the left hand which Jasmin Lewis feel is related to Jasmin Lewis spine metastasis involvement at T5. We could consider focused radiation, but Jasmin Lewis am much more concerned that Jasmin Lewis systemic disease is rapidly progressing. Jasmin Lewis cannot rule out carpal tunnel syndrome.   Jasmin Lewis received Jasmin Lewis 2nd cycle of trastruzumab on February 16th, and received both Perjeta and Taxotere the next day. Jasmin Lewis tolerated this regimen without much difficulty and we can already see an improvement in the skin lesions of the left mastectomy and improvement in Jasmin Lewis liver function tests. Jasmin Lewis will send in [place] to use as needed. Otherwise, we will see Jasmin Lewis back in 2 weeks with CBC and CMP prior to Jasmin Lewis next cycle of therapy. We plan to repeat scans after 3 months. Jasmin Lewis and Jasmin Lewis son understand and agree with this plan of care. Jasmin Lewis have answered their questions and Jasmin Lewis knows to call with any concerns.    Jasmin Lewis provided 20 minutes of face-to-face time during this this encounter and > 50% was spent counseling as documented under my assessment and plan.    Jasmin Kaplan, MD [employer] [address] [place] [address] Dept: [phone] Dept Fax: [phone]   CHIEF COMPLAINT:  CC: Stage IIB HER2 receptor positive left breast cancer with evidence of metastatic disease  Current Treatment:  Taxotere/Herceptin/Perjeta   HISTORY OF PRESENT ILLNESS:  Jasmin Lewis is a  88 y.o. female who presents for a transfer of care for there continued evaluation and management of stage IIB left breast cancer. Jasmin Lewis was originally diagnosed in June 2020 when Jasmin Lewis was able to palpate a mass of the left  breast. Mammogram and ultrasound from May 2020 revealed a 2.9 cm irregular mass in the upper outer left breast as well as two adjacent abnormal left axillary lymph nodes. Biopsy was obtained on June 3rd and revealed invasive ductal carcinoma and DCIS with lymphovascular invasion. Left axillary lymph node was positive for invasive ductal carcinoma. HER2 was positive 3+. Estrogen and progesterone receptors were both negative at 0%, and Ki67 was 70%. Breast MRI from July revealed a 3.3 cm mass of the lateral portion of the left breast as well as significant non mass enhancement surrounding this mass and extending anteriorly into the nipple base, with largest diameter in the anterior to posterior axis, measuring 7.2 centimeters. Three enlarged left axillary lymph nodes. Right breast was negative.  PET imaging from July revealed no evidence of additional metastatic involvement. Jasmin Lewis received neoadjuvant Kadcyla/Perjeta for 6 cycles. Repeat breast MRI from October revealed complete resolution of the enhancing mass and associated non mass enhancement within the left breast. Previously identified left axillary lymphadenopathy was no longer identified. Jasmin Lewis underwent left mastectomy and targeted lymph node dissection in November 2020 and final pathology from this procedure confirmed no evidence of malignancy, for a complete response. Three sentinel lymph nodes were negative for malignancy (0/3). Jasmin Lewis continued maintenance Herceptin injections for two more doses, but this was discontinued in December 2020 as Jasmin Lewis refused further ECHO scans.   Of note, Jasmin Lewis also has a history of stage II colon cancer, diagnosed in May 2007. Preop CEA was 5.2. Jasmin Lewis underwent surgical resection and surgical pathology revealed a 5.9 x 3.8 x 1.2 cm moderate to well-differentiated adenocarcinoma penetrating the muscular wall. Forty lymph nodes negative. No vascular or lymphatic invasion. Multiple diverticula with microabscess formation and  inflammation. Carcinoma present in at least 1 diverticulum. Jasmin Lewis received adjuvant chemotherapy with Xeloda for 9 months. Colonoscopy from August 2013 revealed 2 polyps which were resected and chronic diverticulosis.  [place] states that Jasmin Lewis presented to [facility] ER on December 9th due to constipation and back pain with spasms. CT abdomen/pelvis from December 9th revealed new lytic lesion involving the L2 vertebral body with minimal cortical breakthrough superiorly, but with preservation of vertebral body height, concerning for metastatic disease. There are several indeterminate hepatic lesions. In addition to the lesions, there is an additional subtle 1.5 cm hypodensity in the hepatic dome. MRI abdomen from January 3rd revealed numerous heterogeneously enhancing, internally necrotic lesions in the right hepatic lobe, highly concerning for metastatic disease. Largest lesion measures up to 6.7 cm in hepatic segment VI. Enhancing osseous lesions in the L2 vertebral body, both iliac bones, and in the sacrum, highly concerning for osseous metastases. Jasmin Lewis has chronic lymphedema of the left upper extremity since November 2022 after an accident, for which Jasmin Lewis wears a compression sleeve. However, Jasmin Lewis skin is reacting to the plastic cuff of the sleeve, even though Jasmin Lewis has tried turning it inside out. Jasmin Lewis is limited in Jasmin Lewis mobility of the right upper extremity due to prior incident of dislocated shoulder. Jasmin Lewis met with Jasmin Lewis on January 6th and reviewed the imaging. Jasmin Lewis recommended further evaluation before finalizing a treatment plan. We scheduled a PET scan, MRI brain and spine, and ECHO. We also referred Jasmin Lewis to Dr. Lilia Pro for node biopsy and port placement. When he  saw Jasmin Lewis, he found a nodule in the upper left chest wall which was suspicious and biopsied this rather than the left supraclavicular node. Jasmin Lewis underwent biopsy of a chest wall nodule on January 12th with Dr. Lilia Pro. Pathology confirmed carcinoma, 5 mm nodule, involving  dermis, subcutaneous tissue and lymphovascular space, compatible with metastatic breast carcinoma. CK7 positive, estrogen staining is mostly negative but there is some faint positivity. HER2 was positive 3+. Estrogen and progesterone receptors were negative. Ki67 was 70%.   INTERVAL HISTORY:  Jasmin Lewis have reviewed Jasmin Lewis chart and materials related to Jasmin Lewis cancer extensively and collaborated history with the patient. Summary of oncologic history is as follows: Oncology History Overview Note  Cancer Staging Malignant neoplasm of upper-outer quadrant of left breast in female, estrogen receptor negative ([place]) Staging form: Breast, AJCC 8th Edition - Clinical stage from 10/11/2018: Stage IIB (cT2, cN1, cM0, G3, ER-, PR-, HER2+) - Signed by Truitt Merle, MD on 11/02/2018 - Clinical: No stage assigned - Unsigned    History of colon cancer, stage II  09/2005 Initial Diagnosis   stage II adenocarcinoma of the sigmoid colon diagnosed in May 2007   09/21/2005 Surgery   Jasmin Lewis underwent surgical resection on 09/21/2005. Findings were a 5.9 x 3.8 x 1.2 cm moderate to well-differentiated adenocarcinoma penetrating the muscular wall. Forty lymph nodes negative. No vascular or lymphatic invasion. Multiple diverticula with microabscess formation and inflammation. Carcinoma present in at least 1 diverticulum. Preop CEA 5.2 with lab normal range 0 to 2.5. Preop CT scan with no obvious additional pathology.    2007 -  Chemotherapy   Jasmin Lewis received oral Xeloda chemotherapy as an adjuvant. Jasmin Lewis declined treatment on a clinical trial.    11/12/2011 Initial Diagnosis   [name], stage II   12/09/2011 Procedure   Followup colonoscopy done on 12/09/2011. Jasmin Lewis was found to have 2 polyps which were removed. Chronic diverticulosis.     Malignant neoplasm of upper-outer quadrant of left breast in female, estrogen receptor negative ([place])  09/26/2018 Mammogram   Mammogram/US of left breast 09/26/18 IMPRESSION:  1. 2.9cm irregular mass  in the upper outer left breast corresponds to the palpable abnormality. This is highly suspicious for breast carcinoma.  2. Two adjacent abnormal left axillary  Lymph nodes suspicious for metastatic adenopathy. There is a third borderline abnormal left axillary LN with a cortex thickened to 25m.  3. Benign right breast cyst. No evidence of right breast malignancy.    10/11/2018 Cancer Staging   Staging form: Breast, AJCC 8th Edition - Clinical stage from 10/11/2018: Stage IIB (cT2, cN1, cM0, G3, ER-, PR-, HER2+) - Signed by [name] MD on 11/02/2018    10/11/2018 Initial Biopsy   Diagnosis 10/11/18 1. Breast, left, needle core biopsy, upper outer left 2 o'clock - INVASIVE DUCTAL CARCINOMA. - DUCTAL CARCINOMA IN SITU. - LYMPHOVASCULAR INVASION IS IDENTIFIED. - SEE COMMENT. 2. Lymph node, needle/core biopsy, left axilla - INVASIVE DUCTAL CARCINOMA. - SEE COMMENT.   10/11/2018 Receptors her2   The tumor cells are POSITIVE for Her2 (3+). Estrogen Receptor: 0%, NEGATIVE Progesterone Receptor: 0%, NEGATIVE Proliferation Marker Ki67: 70%   11/02/2018 Initial Diagnosis   Malignant neoplasm of upper-outer quadrant of left breast in female, estrogen receptor negative ([place]   11/15/2018 Breast MRI   MRI breast 11/15/18  IMPRESSION: 1. 3.3 centimeter mass in the LATERAL portion of the LEFT breast consistent with known malignancy. 2. There is significant non mass enhancement surrounding this mass and extending anteriorly into the nipple base,  with largest diameter in the anterior to posterior axis, measuring 7.2 centimeters. 3. If the patient would consider breast conservation, additional MR guided core biopsies are recommended. Consider biopsy of the inferior and anterior extent of the non mass enhancement to document extent of disease. 4. Three enlarged LEFT axillary lymph nodes. 5. RIGHT breast is negative.   11/15/2018 PET scan   PET 11/15/18 IMPRESSION: Hypermetabolic left breast lesion  compatible with known primary. Hypermetabolic left axillary lymph nodes are consistent with metastatic disease.   No evidence for additional hypermetabolic metastatic involvement in the neck, chest, abdomen, or pelvis.   11/17/2018 - 03/01/2019 Chemotherapy   Neo-adjuvant Kadcyla and perjeta q3weeks for 6 cycles starting 11/17/18. Stopped before surgery    11/29/2018 Pathology Results   Diagnosis 1. Breast, left, needle core biopsy, inferior anterior (cylinder clip) - INVASIVE DUCTAL CARCINOMA. - DUCTAL CARCINOMA IN SITU. - LYMPHOVASCULAR INVASION IS IDENTIFIED. - SEE COMMENT. 2. Breast, left, needle core biopsy, central posterior (barbell clip) - INVASIVE DUCTAL CARCINOMA. - LYMPHOVASCULAR INVASION IS IDENTIFIED.   02/12/2019 Breast MRI   IMPRESSION: 1. Complete resolution of previously identified enhancing mass and associated non mass enhancement within the left breast. This is consistent with excellent response to chemotherapy. No residual or suspicious findings are identified. 2. No MRI evidence of malignancy on the right. 3. Previously identified left axillary lymphadenopathy not definitively seen on today's study. However, this may be due to decreased field-of-view compared to prior study.     03/14/2019 Cancer Staging   Staging form: Breast, AJCC 8th Edition - Pathologic stage from 03/14/2019: pT0, pN0, cM0, GX, ER: Unknown, PR: Unknown, HER2: Not Assessed - Signed by Truitt Merle, MD on 03/28/2019    03/14/2019 Surgery   LEFT MASTECTOMY WITH TARGETED LYMPH NODE DISSECTION and Left Axillary Sentinel Lymph  Node Biopsy by Dr [name] 03/14/19    03/14/2019 Pathology Results   FINAL MICROSCOPIC DIAGNOSIS:   A. LYMPH NODE, LEFT, SENTINEL, BIOPSY:  - There is no evidence of carcinoma in 1 of 1 lymph node (0/1).   B. BREAST, LEFT, MASTECTOMY:  - Benign breast parenchyma with treatment-related changes.  - There is no evidence of malignancy.  - See oncology table below.   C.  LYMPH NODE, LEFT #1, SENTINEL, BIOPSY:  - There is no evidence of carcinoma in 1 of 1 lymph node (0/1).   D. LYMPH NODE, LEFT #2, SENTINEL, BIOPSY:  - There is no evidence of carcinoma in 1 of 1 lymph node (0/1).     04/04/2019 - 04/25/2019 Chemotherapy   Maintenance Herceptin injections every 3 weeks starting 04/03/19 to complete 1 year of treatment that was started in 11/2018. Stopped after 2nd dose as Jasmin Lewis will not be repeating Echos.    06/03/2021 - 06/24/2021 Chemotherapy   Patient is on Treatment Plan : BREAST Trastuzumab q21d X 11 Cycles     06/25/2021 -  Chemotherapy   Patient is on Treatment Plan : BREAST Docetaxel + Trastuzumab + Pertuzumab (THP) q21d     Liver metastases ([place])  04/17/2021 Imaging   CT ABDOMEN AND PELVIS WITH CONTRAST: -New lytic lesion involving the L2 vertebral body (6:72, 2:26) with minimal cortical breakthrough superiorly, but with preservation of vertebral body height, concerning for metastatic disease.  -There are several indeterminate hepatic lesions as described above. In addition to the lesions described above, there is an additional subtle 1.5 cm hypodensity in the hepatic dome (2:7). Given clinical history and presence of a new L2 lesion, leading differential consideration is  metastatic disease. Recommend further evaluation with MRI of the abdomen with and without contrast.   05/12/2021 Imaging   MRI ABDOMEN WITH AND WITHOUT CONTRAST: Numerous heterogeneously enhancing, internally necrotic lesions in the right hepatic lobe, highly concerning for metastatic disease. Largest lesion measures up to 6.7 cm in hepatic segment VI.   Enhancing osseous lesions in the L2 vertebral body, both iliac bones, and in the sacrum, highly concerning for osseous metastases.   05/15/2021 Initial Diagnosis   Liver metastases ([place])   05/27/2021 PET scan   1. Widespread recurrent/metastatic disease in this patient who is  status post bilateral mastectomy. Left chest wall  recurrence with  left axillary/subpectoral, supraclavicular nodal metastasis.  2. Hepatic, left adrenal, and widespread osseous metastasis.  3. Incidental findings, including: Right nephrolithiasis. Tiny  hiatal hernia.    05/27/2021 Imaging   MRI LUMBAR AND THORACIC SPINE: Thoracic spine:  Osseous metastatic disease involving each level. The most dramatic deposits are at T4 and T6 where there is extraosseous/epidural tumor. No cord compression but there is foraminal impingement on the left at T6-7 and on the right at T3-4. Early dorsal epidural tumor at the level of T10 and T3.   Lumbar spine:  1. Widespread osseous metastatic disease with largest deposit  replacing the L2 body where there is a compression fracture with  mild height loss. Also at this level is early epidural tumor  extension likely affecting both L2-3 foramina.  2. 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    05/27/2021 Imaging   MRI HEAD WITH AND WITHOUT CONTRAST: Negative for metastatic disease to the brain or calvarium.   06/03/2021 - 06/24/2021 Chemotherapy   Patient is on Treatment Plan : BREAST Trastuzumab q21d X 11 Cycles     06/25/2021 -  Chemotherapy   Patient is on Treatment Plan : BREAST Docetaxel + Trastuzumab + Pertuzumab (THP) q21d     Bone metastases ([place])  04/17/2021 Imaging   CT ABDOMEN AND PELVIS WITH CONTRAST: -New lytic lesion involving the L2 vertebral body (6:72, 2:26) with minimal cortical breakthrough superiorly, but with preservation of vertebral body height, concerning for metastatic disease.  -There are several indeterminate hepatic lesions as described above. In addition to the lesions described above, there is an additional subtle 1.5 cm hypodensity in the hepatic dome (2:7). Given clinical history and presence of a new L2 lesion, leading differential consideration is metastatic disease. Recommend further evaluation with MRI of the abdomen with and without  contrast.   05/12/2021 Imaging   MRI ABDOMEN WITH AND WITHOUT CONTRAST: Numerous heterogeneously enhancing, internally necrotic lesions in the right hepatic lobe, highly concerning for metastatic disease. Largest lesion measures up to 6.7 cm in hepatic segment VI.   Enhancing osseous lesions in the L2 vertebral body, both iliac bones, and in the sacrum, highly concerning for osseous metastases.   05/15/2021 Initial Diagnosis   Bone metastases ([place])   05/27/2021 PET scan   1. Widespread recurrent/metastatic disease in this patient who is  status post bilateral mastectomy. Left chest wall recurrence with  left axillary/subpectoral, supraclavicular nodal metastasis.  2. Hepatic, left adrenal, and widespread osseous metastasis.  3. Incidental findings, including: Right nephrolithiasis. Tiny  hiatal hernia.    05/27/2021 Imaging   MRI LUMBAR AND THORACIC SPINE: Thoracic spine:  Osseous metastatic disease involving each level. The most dramatic deposits are at T4 and T6 where there is extraosseous/epidural tumor. No cord compression but there is foraminal impingement on the left at T6-7 and on  the right at T3-4. Early dorsal epidural tumor at the level of T10 and T3.   Lumbar spine:  1. Widespread osseous metastatic disease with largest deposit  replacing the L2 body where there is a compression fracture with  mild height loss. Also at this level is early epidural tumor  extension likely affecting both L2-3 foramina.  2. 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    05/27/2021 Imaging   MRI HEAD WITH AND WITHOUT CONTRAST: Negative for metastatic disease to the brain or calvarium.   06/03/2021 - 06/24/2021 Chemotherapy   Patient is on Treatment Plan : BREAST Trastuzumab q21d X 11 Cycles     06/25/2021 -  Chemotherapy   Patient is on Treatment Plan : BREAST Docetaxel + Trastuzumab + Pertuzumab (THP) q21d      Anette is here to see how Jasmin Lewis tolerated Jasmin Lewis new therapy  regimen, which now consists of Taxotere/Herceptin/Perjeta. When Jasmin Lewis was seen last week, Jasmin Lewis had severe progression of chest wall recurrence of the left mastectomy with large red nodules and a mass of the axillary tail measuring at least 3-4 cm, as well as increased left supraclavicular adenopathy. Jasmin Lewis states that Jasmin Lewis tolerated without significant difficulty. Jasmin Lewis did have some soreness of the mouth, and so Jasmin Lewis will send in [place] to use as needed. Jasmin Lewis can already can tell a difference in Jasmin Lewis skin lesions of the left mastectomy. White count is elevated at 12.6, secondary to Neulasta, and hemoglobin and platelets are normal. Chemistries are unremarkable except for a BUN of 21, a SGOT of 57, significantly improved from 155, and an alkaline phosphatase of 238, improved from 417. Jasmin Lewis  appetite is fair, and Jasmin Lewis has lost 3 pounds since Jasmin Lewis last visit.  Jasmin Lewis denies fever, chills or other signs of infection.  Jasmin Lewis denies nausea, vomiting, bowel issues, or abdominal pain.  Jasmin Lewis denies sore throat, cough, dyspnea, or chest pain. Jasmin Lewis is accompanied by Jasmin Lewis son Suezanne Jacquet.  HISTORY:   Allergies: No Known Allergies  Current Medications: Current Outpatient Medications  Medication Sig Dispense Refill   dexamethasone (DECADRON) 4 MG tablet Take 5 tabs at the night before and 5 tab the morning of chemotherapy, every 3 weeks, by mouth 60 tablet 0   fish oil-omega-3 fatty acids 1000 MG capsule Take 1 g by mouth daily.     Gauze Pads & Dressings (TELFA NON-ADHERENT) 3"X4" PADS 1 each by Does not apply route in the morning and at bedtime. 100 each 0   ondansetron (ZOFRAN) 4 MG tablet Take 1 tablet (4 mg total) by mouth every 8 (eight) hours as needed for nausea or vomiting. 20 tablet 2   oxyCODONE (OXY IR/ROXICODONE) 5 MG immediate release tablet Take 1 tablet (5 mg total) by mouth every 4 (four) hours as needed. 100 tablet 0   polyethylene glycol (MIRALAX / GLYCOLAX) 17 g packet Take 17 g by mouth as needed. constipation      senna (SENOKOT) 8.6 MG TABS tablet Take 1 tablet by mouth 2 (two) times daily.     No current facility-administered medications for this visit.    REVIEW OF SYSTEMS:  Review of Systems  Constitutional: Negative.  Negative for appetite change, chills, fatigue, fever and unexpected weight change.  HENT:  Negative.    Eyes: Negative.   Respiratory: Negative.  Negative for chest tightness, cough, hemoptysis, shortness of breath and wheezing.   Cardiovascular: Negative.  Negative for chest pain, leg swelling and palpitations.  Gastrointestinal: Negative.  Negative for  abdominal distention, abdominal pain, blood in stool, constipation, diarrhea, nausea and vomiting.  Endocrine: Negative.   Genitourinary: Negative.  Negative for difficulty urinating, dysuria, frequency and hematuria.   Musculoskeletal: Negative.  Negative for arthralgias, back pain, flank pain, gait problem and myalgias.       Lymphedema of the left upper extremity  Skin: Negative.   Neurological:  Positive for numbness (neuropathy and paresthesias of the left hand). Negative for dizziness, extremity weakness, gait problem, headaches, light-headedness, seizures and speech difficulty.  Hematological: Negative.   Psychiatric/Behavioral: Negative.  Negative for depression and sleep disturbance. The patient is not nervous/anxious.     VITALS:  Blood pressure (!) 145/67, pulse 90, temperature 98.3 F (36.8 C), temperature source Oral, resp. rate 20, height 5' 0.25" (1.53 m), weight 111 lb 8 oz (50.6 kg), SpO2 96 %.  Wt Readings from Last 3 Encounters:  07/03/21 111 lb 8 oz (50.6 kg)  06/25/21 114 lb (51.7 kg)  06/24/21 114 lb (51.7 kg)    Body mass index is 21.6 kg/m.  Performance status (ECOG): 1 - Symptomatic but completely ambulatory  PHYSICAL EXAM:  Physical Exam Constitutional:      General: Jasmin Lewis is not in acute distress.    Appearance: Normal appearance. Jasmin Lewis is normal weight.  HENT:     Head: Normocephalic and  atraumatic.  Eyes:     General: No scleral icterus.    Extraocular Movements: Extraocular movements intact.     Conjunctiva/sclera: Conjunctivae normal.     Pupils: Pupils are equal, round, and reactive to light.  Cardiovascular:     Rate and Rhythm: Normal rate and regular rhythm.     Pulses: Normal pulses.     Heart sounds: Normal heart sounds. No murmur heard.   No friction rub. No gallop.  Pulmonary:     Effort: Pulmonary effort is normal. No respiratory distress.     Breath sounds: Normal breath sounds.  Chest:     Comments: Jasmin Lewis still has some nodules of the left mastectomy but not nearly as swollen or firm. A lot of the nodules have flattened out. The nodal mass at the axillary tail, also is smaller at 2 cm. Abdominal:     General: Bowel sounds are normal. There is no distension.     Palpations: Abdomen is soft. There is no hepatomegaly, splenomegaly or mass.     Tenderness: There is no abdominal tenderness.  Musculoskeletal:        General: Normal range of motion.     Cervical back: Normal range of motion and neck supple.     Right lower leg: No edema.     Left lower leg: No edema.  Lymphadenopathy:     Cervical: No cervical adenopathy.     Comments: Left supraclavicular adenopathy is significantly improved.  Skin:    General: Skin is warm and dry.  Neurological:     General: No focal deficit present.     Mental Status: Jasmin Lewis is alert and oriented to person, place, and time. Mental status is at baseline.  Psychiatric:        Mood and Affect: Mood normal.        Behavior: Behavior normal.        Thought Content: Thought content normal.        Judgment: Judgment normal.    LABS:   CBC Latest Ref Rng & Units 07/03/2021 06/24/2021 06/12/2021  WBC - 12.6 6.9 7.2  Hemoglobin 12.0 - 16.0 12.1 13.1 13.7  Hematocrit 36 -  46 36 39 41  Platelets 150 - 399 224 254 267   CMP Latest Ref Rng & Units 07/03/2021 06/24/2021 06/12/2021  Glucose 70 - 99 mg/dL - - -  BUN 4 - 21 21 19 14    Creatinine 0.5 - 1.1 0.7 0.7 0.7  Sodium 137 - 147 137 135(A) 137  Potassium 3.4 - 5.3 4.0 4.6 4.1  Chloride 99 - 108 106 102 104  CO2 13 - 22 23(A) 27(A) 24(A)  Calcium 8.7 - 10.7 8.7 9.5 8.4(A)  Total Protein 6.5 - 8.1 g/dL - - -  Total Bilirubin 0.3 - 1.2 mg/dL - - -  Alkaline Phos 25 - 125 238(A) 417(A) 298(A)  AST 13 - 35 57(A) 155(A) 96(A)  ALT 7 - 35 30 46(A) 35     Lab Results  Component Value Date   CEA1 1.8 06/03/2021   /  CEA  Date Value Ref Range Status  06/03/2021 1.8 0.0 - 4.7 ng/mL Final    Comment:    (NOTE)                             Nonsmokers          <3.9                             Smokers             <5.6 [employer] Electrochemiluminescence Immunoassay (ECLIA) Values obtained with different assay methods or kits cannot be used interchangeably.  Results cannot be interpreted as absolute evidence of the presence or absence of malignant disease. Performed At: [facility] [place], [place] [id number] Rush Farmer MD DJ:[id number]     Lab Results  Component Value Date   LDH 186 11/12/2011   LDH 187 09/08/2010   LDH 171 09/09/2009    STUDIES:  No results found.     Jasmin Lewis, Rita Ohara, am acting as scribe for Jasmin Kaplan, MD  Jasmin Lewis have reviewed this report as typed by the medical scribe, and it is complete and accurate.

## 2021-06-30 ENCOUNTER — Telehealth: Payer: Self-pay

## 2021-06-30 ENCOUNTER — Encounter: Payer: Self-pay | Admitting: Oncology

## 2021-06-30 NOTE — Telephone Encounter (Signed)
I spoke with Suezanne Jacquet, pt's son, and Mrs. Colmenares. Pt feels like she has done pretty well since infusion last week. She had some diarrhea initially, which she found nice, as she has constipation so much. BM's are normal now. She has noticed fatigue. She denies N/V, mouth sores, SOB, and fevers. She mentioned the lymphedema in her left arm is causing more aggravation than anything. She is also left hand dominant. She is wearing her sleeve. I encouraged her to keep it elevated as much as possible. She knows to limit salt intake. We discussed that pt is to apply neosporin to areas on skin (from call on 06/29/20) and apply Telfa pad (script sent to pharmacy). Pt feels like areas are smaller. Pt also isn't sleeping very well. Her son asked if we had any recommendations. I told him she could try an OTC medication such as melatonin, and to let us know if it doesn't help. I reminded them of the importance of calling us if she develops a temp of 100.4 or higher, day or night. They both verbalized understanding. We confirmed appt for Friday.

## 2021-07-01 ENCOUNTER — Other Ambulatory Visit: Payer: Self-pay | Admitting: Oncology

## 2021-07-01 ENCOUNTER — Encounter: Payer: Self-pay | Admitting: Oncology

## 2021-07-02 ENCOUNTER — Other Ambulatory Visit: Payer: Self-pay | Admitting: Oncology

## 2021-07-03 ENCOUNTER — Inpatient Hospital Stay: Payer: Medicare Other

## 2021-07-03 ENCOUNTER — Inpatient Hospital Stay (INDEPENDENT_AMBULATORY_CARE_PROVIDER_SITE_OTHER): Payer: Medicare Other | Admitting: Oncology

## 2021-07-03 ENCOUNTER — Encounter: Payer: Self-pay | Admitting: Oncology

## 2021-07-03 ENCOUNTER — Other Ambulatory Visit: Payer: Self-pay

## 2021-07-03 VITALS — BP 145/67 | HR 90 | Temp 98.3°F | Resp 20 | Ht 60.25 in | Wt 111.5 lb

## 2021-07-03 DIAGNOSIS — C787 Secondary malignant neoplasm of liver and intrahepatic bile duct: Secondary | ICD-10-CM

## 2021-07-03 DIAGNOSIS — C50912 Malignant neoplasm of unspecified site of left female breast: Secondary | ICD-10-CM

## 2021-07-03 DIAGNOSIS — C7989 Secondary malignant neoplasm of other specified sites: Secondary | ICD-10-CM

## 2021-07-03 DIAGNOSIS — C50412 Malignant neoplasm of upper-outer quadrant of left female breast: Secondary | ICD-10-CM

## 2021-07-03 DIAGNOSIS — C7951 Secondary malignant neoplasm of bone: Secondary | ICD-10-CM

## 2021-07-03 DIAGNOSIS — Z171 Estrogen receptor negative status [ER-]: Secondary | ICD-10-CM

## 2021-07-03 LAB — HEPATIC FUNCTION PANEL
ALT: 30 (ref 7–35)
AST: 57 — AB (ref 13–35)
Alkaline Phosphatase: 238 — AB (ref 25–125)
Bilirubin, Total: 0.5

## 2021-07-03 LAB — BASIC METABOLIC PANEL
BUN: 21 (ref 4–21)
CO2: 23 — AB (ref 13–22)
Chloride: 106 (ref 99–108)
Creatinine: 0.7 (ref 0.5–1.1)
Glucose: 110
Potassium: 4 (ref 3.4–5.3)
Sodium: 137 (ref 137–147)

## 2021-07-03 LAB — COMPREHENSIVE METABOLIC PANEL
Albumin: 3.7 (ref 3.5–5.0)
Calcium: 8.7 (ref 8.7–10.7)

## 2021-07-03 LAB — CBC AND DIFFERENTIAL
HCT: 36 (ref 36–46)
Hemoglobin: 12.1 (ref 12.0–16.0)
Neutrophils Absolute: 10.21
Platelets: 224 (ref 150–399)
WBC: 12.6

## 2021-07-03 LAB — CBC: RBC: 3.86 — AB (ref 3.87–5.11)

## 2021-07-06 ENCOUNTER — Encounter: Payer: Self-pay | Admitting: Oncology

## 2021-07-06 ENCOUNTER — Telehealth: Payer: Self-pay

## 2021-07-06 NOTE — Telephone Encounter (Signed)
-----   Message from [name], MD sent at 07/03/2021 10:22 AM EST ----- Regarding: MMW Pls order [facility] to [facility] in [facility] for her

## 2021-07-06 NOTE — Telephone Encounter (Signed)
Rx called into [facility].

## 2021-07-12 ENCOUNTER — Encounter: Payer: Self-pay | Admitting: Oncology

## 2021-07-14 ENCOUNTER — Other Ambulatory Visit: Payer: Self-pay | Admitting: Hematology and Oncology

## 2021-07-14 DIAGNOSIS — C50412 Malignant neoplasm of upper-outer quadrant of left female breast: Secondary | ICD-10-CM

## 2021-07-15 ENCOUNTER — Other Ambulatory Visit: Payer: Self-pay

## 2021-07-15 ENCOUNTER — Other Ambulatory Visit: Payer: Self-pay | Admitting: Pharmacist

## 2021-07-15 ENCOUNTER — Inpatient Hospital Stay: Payer: Medicare Other | Attending: Oncology

## 2021-07-15 ENCOUNTER — Inpatient Hospital Stay (INDEPENDENT_AMBULATORY_CARE_PROVIDER_SITE_OTHER): Payer: Medicare Other | Admitting: Hematology and Oncology

## 2021-07-15 ENCOUNTER — Ambulatory Visit: Payer: Medicare Other | Admitting: Hematology and Oncology

## 2021-07-15 ENCOUNTER — Encounter: Payer: Self-pay | Admitting: Hematology and Oncology

## 2021-07-15 ENCOUNTER — Other Ambulatory Visit: Payer: Medicare Other

## 2021-07-15 ENCOUNTER — Ambulatory Visit: Payer: Medicare Other

## 2021-07-15 DIAGNOSIS — C787 Secondary malignant neoplasm of liver and intrahepatic bile duct: Secondary | ICD-10-CM

## 2021-07-15 DIAGNOSIS — Z85038 Personal history of other malignant neoplasm of large intestine: Secondary | ICD-10-CM | POA: Insufficient documentation

## 2021-07-15 DIAGNOSIS — C50412 Malignant neoplasm of upper-outer quadrant of left female breast: Secondary | ICD-10-CM | POA: Diagnosis not present

## 2021-07-15 DIAGNOSIS — I890 Lymphedema, not elsewhere classified: Secondary | ICD-10-CM | POA: Insufficient documentation

## 2021-07-15 DIAGNOSIS — Z171 Estrogen receptor negative status [ER-]: Secondary | ICD-10-CM | POA: Insufficient documentation

## 2021-07-15 DIAGNOSIS — C7951 Secondary malignant neoplasm of bone: Secondary | ICD-10-CM | POA: Insufficient documentation

## 2021-07-15 DIAGNOSIS — Z9221 Personal history of antineoplastic chemotherapy: Secondary | ICD-10-CM | POA: Insufficient documentation

## 2021-07-15 DIAGNOSIS — Z5189 Encounter for other specified aftercare: Secondary | ICD-10-CM | POA: Insufficient documentation

## 2021-07-15 DIAGNOSIS — Z79899 Other long term (current) drug therapy: Secondary | ICD-10-CM | POA: Insufficient documentation

## 2021-07-15 DIAGNOSIS — C50912 Malignant neoplasm of unspecified site of left female breast: Secondary | ICD-10-CM

## 2021-07-15 DIAGNOSIS — I10 Essential (primary) hypertension: Secondary | ICD-10-CM | POA: Insufficient documentation

## 2021-07-15 DIAGNOSIS — Z5112 Encounter for antineoplastic immunotherapy: Secondary | ICD-10-CM | POA: Insufficient documentation

## 2021-07-15 DIAGNOSIS — Z5111 Encounter for antineoplastic chemotherapy: Secondary | ICD-10-CM | POA: Insufficient documentation

## 2021-07-15 DIAGNOSIS — C7989 Secondary malignant neoplasm of other specified sites: Secondary | ICD-10-CM

## 2021-07-15 DIAGNOSIS — Z853 Personal history of malignant neoplasm of breast: Secondary | ICD-10-CM | POA: Insufficient documentation

## 2021-07-15 LAB — BASIC METABOLIC PANEL
BUN: 13 (ref 4–21)
CO2: 25 — AB (ref 13–22)
Chloride: 107 (ref 99–108)
Creatinine: 0.6 (ref 0.5–1.1)
Glucose: 96
Potassium: 4.1 mEq/L (ref 3.5–5.1)
Sodium: 137 (ref 137–147)

## 2021-07-15 LAB — CBC AND DIFFERENTIAL
HCT: 35 — AB (ref 36–46)
Hemoglobin: 11.7 — AB (ref 12.0–16.0)
Neutrophils Absolute: 4.62
Platelets: 266 10*3/uL (ref 150–400)
WBC: 6.9

## 2021-07-15 LAB — HEPATIC FUNCTION PANEL
ALT: 25 U/L (ref 7–35)
AST: 43 — AB (ref 13–35)
Alkaline Phosphatase: 236 — AB (ref 25–125)
Bilirubin, Total: 0.6

## 2021-07-15 LAB — COMPREHENSIVE METABOLIC PANEL
Albumin: 3.8 (ref 3.5–5.0)
Calcium: 9.2 (ref 8.7–10.7)

## 2021-07-15 LAB — CBC: RBC: 3.76 — AB (ref 3.87–5.11)

## 2021-07-15 MED FILL — Docetaxel Soln for IV Infusion 160 MG/16ML: INTRAVENOUS | Qty: 9 | Status: AC

## 2021-07-15 MED FILL — Pertuzumab Soln for IV Infusion 420 MG/14ML (30 MG/ML): INTRAVENOUS | Qty: 14 | Status: AC

## 2021-07-15 MED FILL — Dexamethasone Sodium Phosphate Inj 100 MG/10ML: INTRAMUSCULAR | Qty: 1 | Status: AC

## 2021-07-15 MED FILL — Trastuzumab-dkst For IV Soln 150 MG: INTRAVENOUS | Qty: 14.3 | Status: AC

## 2021-07-15 NOTE — Assessment & Plan Note (Signed)
Bone metastases discovered 04/17/21. Enhancing osseous lesions in the L2 vertebral body, both iliac bones, and in the sacrum, highly concerning for osseous metastases. She received her 1st dose of zoledronic acid on January 16th. ?

## 2021-07-15 NOTE — Assessment & Plan Note (Signed)
History of stage II colon cancer, diagnosed in May 2007. This was treated with surgical resection and adjuvant chemotherapy with Xeloda. Colonoscopy from 2013 revealed 2 polyps which were resected and chronic diverticulosis. 

## 2021-07-15 NOTE — Assessment & Plan Note (Signed)
Left supraclavicular lymph node measuring 2-3 cm. Also involvement of subpectoral node and chest wall skin. This is now rapidly progressive despite receiving her 1st dose of trastuzumab.??We have added in Perjeta to her current HER2 targeted therapy as well as chemotherapy with Taxotere. Skin lesions have responded well to this first cycle of treatment.  ?

## 2021-07-15 NOTE — Assessment & Plan Note (Signed)
Liver metastases discovered 04/17/21.  Numerous heterogeneously enhancing, internally necrotic lesions in the right hepatic lobe, consistent with metastatic disease. Largest lesion measures up to 6.7 cm in hepatic segment VI, and there are at least 6 lesions. We now have biopsy proven HER2 positive recurrent breast cancer with ER/PR negative. She?is now receiving THP and is tolerating without significant difficulty. She has improvement in her liver function tests after 1 cycle. ?

## 2021-07-15 NOTE — Progress Notes (Cosign Needed)
Patient Care Team: [name], MD as PCP - General (Family Medicine) [name], MD as Consulting Physician (Oncology)  Clinic Day:  07/15/2021  Referring physician: [name], MD  ASSESSMENT & PLAN:   Assessment & Plan: Malignant neoplasm of upper-outer quadrant of left breast in female, estrogen receptor negative ([place]) Stage IIB (T2c N1 M0) HER2 receptor positive left breast cancer, diagnosed in June 2020. This was ER/PR negative. This was treated with neoadjuvant Kadcyla/Perjeta and left mastectomy, with a complete response in breast and nodes. She had only received two doses of adjuvant Herceptin before being discontinued in December 2020.  History of colon cancer, stage II History of stage II colon cancer, diagnosed in May 2007. This was treated with surgical resection and adjuvant chemotherapy with Xeloda. Colonoscopy from 2013 revealed 2 polyps which were resected and chronic diverticulosis.  Liver metastases ([place]) Liver metastases discovered 04/17/21.  Numerous heterogeneously enhancing, internally necrotic lesions in the right hepatic lobe, consistent with metastatic disease. Largest lesion measures up to 6.7 cm in hepatic segment VI, and there are at least 6 lesions. We now have biopsy proven HER2 positive recurrent breast cancer with ER/PR negative. She is now receiving THP and is tolerating without significant difficulty. She has improvement in her liver function tests after 1 cycle.  Bone metastases ([place]) Bone metastases discovered 04/17/21. Enhancing osseous lesions in the L2 vertebral body, both iliac bones, and in the sacrum, highly concerning for osseous metastases. She received her 1st dose of zoledronic acid on January 16th.  Chest wall recurrence of breast cancer, left ([name]) Left supraclavicular lymph node measuring 2-3 cm. Also involvement of subpectoral node and chest wall skin. This is now rapidly progressive despite receiving her 1st dose of trastuzumab.   We have added in Perjeta to her current HER2 targeted therapy as well as chemotherapy with Taxotere. Skin lesions have responded well to this first cycle of treatment.    The patient understands the plans discussed today and is in agreement with them.  She knows to contact our office if she develops concerns prior to her next appointment.    Melodye Ped, NP  [place] [address] [place] [address] Dept: [phone] Dept Fax: [phone]   [name].     CHIEF COMPLAINT:  CC: An 88 year old female with history of metastatic breast cancer here for 2 week evaluation  Current Treatment:  Docetaxel, Trastuzumab, Pertuzumab  INTERVAL HISTORY:  Taquita is here today for repeat clinical assessment. She denies fevers or chills. She denies pain. Her appetite is good. Her weight has been stable.  I have reviewed the past medical history, past surgical history, social history and family history with the patient and they are unchanged from previous note.  ALLERGIES:  has No Known Allergies.  MEDICATIONS:  Current Outpatient Medications  Medication Sig Dispense Refill   magnesium citrate SOLN Take 0.5 Bottles by mouth as needed for severe constipation.     dexamethasone (DECADRON) 4 MG tablet Take 5 tabs at the night before and 5 tab the morning of chemotherapy, every 3 weeks, by mouth (Patient not taking: Reported on 07/15/2021) 60 tablet 0   fish oil-omega-3 fatty acids 1000 MG capsule Take 1 g by mouth daily.     Gauze Pads & Dressings (TELFA NON-ADHERENT) 3"X4" PADS 1 each by Does not apply route in the morning and at bedtime. (  Patient not taking: Reported on 07/15/2021) 100 each 0   NON FORMULARY Magic Mouth Wash 3 parts Maalox 2 parts Benadryl 1 part Viscous Lidocaine Disp: 6oz Instructions: 43m orally swish and swallow every 3-4 hours  (Called into [facility] 07/06/21     ondansetron (ZOFRAN) 4 MG tablet Take 1 tablet (4 mg total) by mouth every 8 (eight) hours as needed for nausea or vomiting. 20 tablet 2   oxyCODONE (OXY IR/ROXICODONE) 5 MG immediate release tablet Take 1 tablet (5 mg total) by mouth every 4 (four) hours as needed. 100 tablet 0   polyethylene glycol (MIRALAX / GLYCOLAX) 17 g packet Take 17 g by mouth as needed. constipation     senna (SENOKOT) 8.6 MG TABS tablet Take 1 tablet by mouth 2 (two) times daily.     No current facility-administered medications for this visit.    HISTORY OF PRESENT ILLNESS:   Oncology History Overview Note  Cancer Staging Malignant neoplasm of upper-outer quadrant of left breast in female, estrogen receptor negative ([place] Staging form: Breast, AJCC 8th Edition - Clinical stage from 10/11/2018: Stage IIB (cT2, cN1, cM0, G3, ER-, PR-, HER2+) - Signed by [name] MD on 11/02/2018 - Clinical: No stage assigned - Unsigned    History of colon cancer, stage II  09/2005 Initial Diagnosis   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 Findings were a 5.9 x 3.8 x 1.2 cm moderate to well-differentiated adenocarcinoma penetrating the muscular wall. Forty lymph nodes negative. No vascular or lymphatic invasion. Multiple diverticula with microabscess formation and inflammation. Carcinoma present in at least 1 diverticulum. Preop CEA 5.2 with lab normal range 0 to 2.5. Preop CT scan with no obvious additional pathology.    2007 -  Chemotherapy   She received oral Xeloda chemotherapy as an adjuvant. She declined treatment on a clinical trial.    11/12/2011 Initial Diagnosis   [name], stage II   12/09/2011 Procedure   Followup colonoscopy done on 12/09/2011. She was found to have 2 polyps which were removed. Chronic diverticulosis.     Malignant neoplasm of upper-outer quadrant of left breast in  female, estrogen receptor negative ([place]  09/26/2018 Mammogram   Mammogram/US of left breast 09/26/18 IMPRESSION:  1. 2.9cm irregular mass in the upper outer left breast corresponds to the palpable abnormality. This is highly suspicious for breast carcinoma.  2. Two adjacent abnormal left axillary  Lymph nodes suspicious for metastatic adenopathy. There is a third borderline abnormal left axillary LN with a cortex thickened to 454m  3. Benign right breast cyst. No evidence of right breast malignancy.    10/11/2018 Cancer Staging   Staging form: Breast, AJCC 8th Edition - Clinical stage from 10/11/2018: Stage IIB (cT2, cN1, cM0, G3, ER-, PR-, HER2+) - Signed by FeTruitt MerleMD on 11/02/2018    10/11/2018 Initial Biopsy   Diagnosis 10/11/18 1. Breast, left, needle core biopsy, upper outer left 2 o'clock - INVASIVE DUCTAL CARCINOMA. - DUCTAL CARCINOMA IN SITU. - LYMPHOVASCULAR INVASION IS IDENTIFIED. - SEE COMMENT. 2. Lymph node, needle/core biopsy, left axilla - INVASIVE DUCTAL CARCINOMA. - SEE COMMENT.   10/11/2018 Receptors her2   The tumor cells are POSITIVE for Her2 (3+). Estrogen Receptor: 0%, NEGATIVE Progesterone Receptor: 0%, NEGATIVE Proliferation Marker Ki67: 70%   11/02/2018 Initial Diagnosis   Malignant neoplasm of upper-outer quadrant of left breast in female, estrogen receptor  negative ([place])   11/15/2018 Breast MRI   MRI breast 11/15/18  IMPRESSION: 1. 3.3 centimeter mass in the LATERAL portion of the LEFT breast consistent with known malignancy. 2. There is significant non mass enhancement surrounding this mass and extending anteriorly into the nipple base, with largest diameter in the anterior to posterior axis, measuring 7.2 centimeters. 3. If the patient would consider breast conservation, additional MR guided core biopsies are recommended. Consider biopsy of the inferior and anterior extent of the non mass enhancement to document extent of disease. 4. Three enlarged LEFT  axillary lymph nodes. 5. RIGHT breast is negative.   11/15/2018 PET scan   PET 11/15/18 IMPRESSION: Hypermetabolic left breast lesion compatible with known primary. Hypermetabolic left axillary lymph nodes are consistent with metastatic disease.   No evidence for additional hypermetabolic metastatic involvement in the neck, chest, abdomen, or pelvis.   11/17/2018 - 03/01/2019 Chemotherapy   Neo-adjuvant Kadcyla and perjeta q3weeks for 6 cycles starting 11/17/18. Stopped before surgery    11/29/2018 Pathology Results   Diagnosis 1. Breast, left, needle core biopsy, inferior anterior (cylinder clip) - INVASIVE DUCTAL CARCINOMA. - DUCTAL CARCINOMA IN SITU. - LYMPHOVASCULAR INVASION IS IDENTIFIED. - SEE COMMENT. 2. Breast, left, needle core biopsy, central posterior (barbell clip) - INVASIVE DUCTAL CARCINOMA. - LYMPHOVASCULAR INVASION IS IDENTIFIED.   02/12/2019 Breast MRI   IMPRESSION: 1. Complete resolution of previously identified enhancing mass and associated non mass enhancement within the left breast. This is consistent with excellent response to chemotherapy. No residual or suspicious findings are identified. 2. No MRI evidence of malignancy on the right. 3. Previously identified left axillary lymphadenopathy not definitively seen on today's study. However, this may be due to decreased field-of-view compared to prior study.     03/14/2019 Cancer Staging   Staging form: Breast, AJCC 8th Edition - Pathologic stage from 03/14/2019: pT0, pN0, cM0, GX, ER: Unknown, PR: Unknown, HER2: Not Assessed - Signed by Truitt Merle, MD on 03/28/2019    03/14/2019 Surgery   LEFT MASTECTOMY WITH TARGETED LYMPH NODE DISSECTION and Left Axillary Sentinel Lymph  Node Biopsy by Dr [name] 03/14/19    03/14/2019 Pathology Results   FINAL MICROSCOPIC DIAGNOSIS:   A. LYMPH NODE, LEFT, SENTINEL, BIOPSY:  - There is no evidence of carcinoma in 1 of 1 lymph node (0/1).   B. BREAST, LEFT, MASTECTOMY:  -  Benign breast parenchyma with treatment-related changes.  - There is no evidence of malignancy.  - See oncology table below.   C. LYMPH NODE, LEFT #1, SENTINEL, BIOPSY:  - There is no evidence of carcinoma in 1 of 1 lymph node (0/1).   D. LYMPH NODE, LEFT #2, SENTINEL, BIOPSY:  - There is no evidence of carcinoma in 1 of 1 lymph node (0/1).     04/04/2019 - 04/25/2019 Chemotherapy   Maintenance Herceptin injections every 3 weeks starting 04/03/19 to complete 1 year of treatment that was started in 11/2018. Stopped after 2nd dose as she will not be repeating [place].    06/03/2021 - 06/24/2021 Chemotherapy   Patient is on Treatment Plan : BREAST Trastuzumab q21d X 11 Cycles     06/25/2021 -  Chemotherapy   Patient is on Treatment Plan : BREAST Docetaxel + Trastuzumab + Pertuzumab (THP) q21d     Liver metastases ([place])  04/17/2021 Imaging   CT ABDOMEN AND PELVIS WITH CONTRAST: -New lytic lesion involving the L2 vertebral body (6:72, 2:26) with minimal cortical breakthrough superiorly, but with preservation of vertebral body height, concerning  for metastatic disease.  -There are several indeterminate hepatic lesions as described above. In addition to the lesions described above, there is an additional subtle 1.5 cm hypodensity in the hepatic dome (2:7). Given clinical history and presence of a new L2 lesion, leading differential consideration is metastatic disease. Recommend further evaluation with MRI of the abdomen with and without contrast.   05/12/2021 Imaging   MRI ABDOMEN WITH AND WITHOUT CONTRAST: Numerous heterogeneously enhancing, internally necrotic lesions in the right hepatic lobe, highly concerning for metastatic disease. Largest lesion measures up to 6.7 cm in hepatic segment VI.   Enhancing osseous lesions in the L2 vertebral body, both iliac bones, and in the sacrum, highly concerning for osseous metastases.   05/15/2021 Initial Diagnosis   Liver metastases ([place])   05/27/2021 PET  scan   1. Widespread recurrent/metastatic disease in this patient who is  status post bilateral mastectomy. Left chest wall recurrence with  left axillary/subpectoral, supraclavicular nodal metastasis.  2. Hepatic, left adrenal, and widespread osseous metastasis.  3. Incidental findings, including: Right nephrolithiasis. Tiny  hiatal hernia.    05/27/2021 Imaging   MRI LUMBAR AND THORACIC SPINE: Thoracic spine:  Osseous metastatic disease involving each level. The most dramatic deposits are at T4 and T6 where there is extraosseous/epidural tumor. No cord compression but there is foraminal impingement on the left at T6-7 and on the right at T3-4. Early dorsal epidural tumor at the level of T10 and T3.   Lumbar spine:  1. Widespread osseous metastatic disease with largest deposit  replacing the L2 body where there is a compression fracture with  mild height loss. Also at this level is early epidural tumor  extension likely affecting both L2-3 foramina.  2. 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    05/27/2021 Imaging   MRI HEAD WITH AND WITHOUT CONTRAST: Negative for metastatic disease to the brain or calvarium.   06/03/2021 - 06/24/2021 Chemotherapy   Patient is on Treatment Plan : BREAST Trastuzumab q21d X 11 Cycles     06/25/2021 -  Chemotherapy   Patient is on Treatment Plan : BREAST Docetaxel + Trastuzumab + Pertuzumab (THP) q21d     Bone metastases ([place])  04/17/2021 Imaging   CT ABDOMEN AND PELVIS WITH CONTRAST: -New lytic lesion involving the L2 vertebral body (6:72, 2:26) with minimal cortical breakthrough superiorly, but with preservation of vertebral body height, concerning for metastatic disease.  -There are several indeterminate hepatic lesions as described above. In addition to the lesions described above, there is an additional subtle 1.5 cm hypodensity in the hepatic dome (2:7). Given clinical history and presence of a new L2 lesion, leading  differential consideration is metastatic disease. Recommend further evaluation with MRI of the abdomen with and without contrast.   05/12/2021 Imaging   MRI ABDOMEN WITH AND WITHOUT CONTRAST: Numerous heterogeneously enhancing, internally necrotic lesions in the right hepatic lobe, highly concerning for metastatic disease. Largest lesion measures up to 6.7 cm in hepatic segment VI.   Enhancing osseous lesions in the L2 vertebral body, both iliac bones, and in the sacrum, highly concerning for osseous metastases.   05/15/2021 Initial Diagnosis   Bone metastases ([place])   05/27/2021 PET scan   1. Widespread recurrent/metastatic disease in this patient who is  status post bilateral mastectomy. Left chest wall recurrence with  left axillary/subpectoral, supraclavicular nodal metastasis.  2. Hepatic, left adrenal, and widespread osseous metastasis.  3. Incidental findings, including: Right nephrolithiasis. Tiny  hiatal hernia.  05/27/2021 Imaging   MRI LUMBAR AND THORACIC SPINE: Thoracic spine:  Osseous metastatic disease involving each level. The most dramatic deposits are at T4 and T6 where there is extraosseous/epidural tumor. No cord compression but there is foraminal impingement on the left at T6-7 and on the right at T3-4. Early dorsal epidural tumor at the level of T10 and T3.   Lumbar spine:  1. Widespread osseous metastatic disease with largest deposit  replacing the L2 body where there is a compression fracture with  mild height loss. Also at this level is early epidural tumor  extension likely affecting both L2-3 foramina.  2. 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    05/27/2021 Imaging   MRI HEAD WITH AND WITHOUT CONTRAST: Negative for metastatic disease to the brain or calvarium.   06/03/2021 - 06/24/2021 Chemotherapy   Patient is on Treatment Plan : BREAST Trastuzumab q21d X 11 Cycles     06/25/2021 -  Chemotherapy   Patient is on Treatment Plan :  BREAST Docetaxel + Trastuzumab + Pertuzumab (THP) q21d         REVIEW OF SYSTEMS:   Constitutional: Denies fevers, chills or abnormal weight loss Eyes: Denies blurriness of vision Ears, nose, mouth, throat, and face: Denies mucositis or sore throat Respiratory: Denies cough, dyspnea or wheezes Cardiovascular: Denies palpitation, chest discomfort or lower extremity swelling Gastrointestinal:  Denies nausea, heartburn or change in bowel habits Skin: Denies abnormal skin rashes Lymphatics: Denies new lymphadenopathy or easy bruising Neurological:Denies numbness, tingling or new weaknesses Behavioral/Psych: Mood is stable, no new changes  All other systems were reviewed with the patient and are negative.   VITALS:  Blood pressure (!) 154/73, pulse 79, temperature 98.8 F (37.1 C), temperature source Oral, resp. rate 20, height 5' 0.25" (1.53 m), weight 111 lb 6.4 oz (50.5 kg), SpO2 95 %.  Wt Readings from Last 3 Encounters:  07/15/21 111 lb 6.4 oz (50.5 kg)  07/03/21 111 lb 8 oz (50.6 kg)  06/25/21 114 lb (51.7 kg)    Body mass index is 21.58 kg/m.  Performance status (ECOG): 1 - Symptomatic but completely ambulatory  PHYSICAL EXAM:   GENERAL:alert, no distress and comfortable SKIN: skin color, texture, turgor are normal, no rashes or significant lesions EYES: normal, Conjunctiva are pink and non-injected, sclera clear OROPHARYNX:no exudate, no erythema and lips, buccal mucosa, and tongue normal  NECK: supple, thyroid normal size, non-tender, without nodularity LYMPH:  no palpable lymphadenopathy in the cervical, axillary or inguinal LUNGS: clear to auscultation and percussion with normal breathing effort HEART: regular rate & rhythm and no murmurs and no lower extremity edema ABDOMEN:abdomen soft, non-tender and normal bowel sounds Musculoskeletal:no cyanosis of digits and no clubbing  NEURO: alert & oriented x 3 with fluent speech, no focal motor/sensory  deficits  LABORATORY DATA:  I have reviewed the data as listed    Component Value Date/Time   NA 137 07/15/2021 0000   NA 137 11/13/2012 1057   K 4.1 07/15/2021 0000   K 4.5 11/13/2012 1057   CL 107 07/15/2021 0000   CO2 25 (A) 07/15/2021 0000   CO2 26 11/13/2012 1057   GLUCOSE 104 (H) 04/25/2019 1045   GLUCOSE 90 11/13/2012 1057   BUN 13 07/15/2021 0000   BUN 18.3 11/13/2012 1057   CREATININE 0.6 07/15/2021 0000   CREATININE 0.90 04/25/2019 1045   CREATININE 0.8 11/13/2012 1057   CALCIUM 9.2 07/15/2021 0000   CALCIUM 9.7 11/13/2012 1057   PROT 7.4  04/25/2019 1045   PROT 7.4 11/13/2012 1057   ALBUMIN 3.8 07/15/2021 0000   ALBUMIN 3.9 11/13/2012 1057   AST 43 (A) 07/15/2021 0000   AST 41 04/25/2019 1045   AST 22 11/13/2012 1057   ALT 25 07/15/2021 0000   ALT 29 04/25/2019 1045   ALT 15 11/13/2012 1057   ALKPHOS 236 (A) 07/15/2021 0000   ALKPHOS 77 11/13/2012 1057   BILITOT 0.5 04/25/2019 1045   BILITOT 0.54 11/13/2012 1057   GFRNONAA 58 (L) 04/25/2019 1045   GFRAA >60 04/25/2019 1045    No results found for: SPEP, UPEP  Lab Results  Component Value Date   WBC 6.9 07/15/2021   NEUTROABS 4.62 07/15/2021   HGB 11.7 (A) 07/15/2021   HCT 35 (A) 07/15/2021   MCV 95.9 04/25/2019   PLT 266 07/15/2021      Chemistry      Component Value Date/Time   NA 137 07/15/2021 0000   NA 137 11/13/2012 1057   K 4.1 07/15/2021 0000   K 4.5 11/13/2012 1057   CL 107 07/15/2021 0000   CO2 25 (A) 07/15/2021 0000   CO2 26 11/13/2012 1057   BUN 13 07/15/2021 0000   BUN 18.3 11/13/2012 1057   CREATININE 0.6 07/15/2021 0000   CREATININE 0.90 04/25/2019 1045   CREATININE 0.8 11/13/2012 1057   GLU 96 07/15/2021 0000      Component Value Date/Time   CALCIUM 9.2 07/15/2021 0000   CALCIUM 9.7 11/13/2012 1057   ALKPHOS 236 (A) 07/15/2021 0000   ALKPHOS 77 11/13/2012 1057   AST 43 (A) 07/15/2021 0000   AST 41 04/25/2019 1045   AST 22 11/13/2012 1057   ALT 25 07/15/2021 0000    ALT 29 04/25/2019 1045   ALT 15 11/13/2012 1057   BILITOT 0.5 04/25/2019 1045   BILITOT 0.54 11/13/2012 1057       RADIOGRAPHIC STUDIES: I have personally reviewed the radiological images as listed and agreed with the findings in the report. No results found.

## 2021-07-15 NOTE — Assessment & Plan Note (Signed)
Stage IIB (T2c N1 M0) HER2 receptor positive left breast cancer, diagnosed in June 2020. This was ER/PR negative. This was treated with neoadjuvant Kadcyla/Perjeta and left mastectomy, with a complete response in breast and nodes. She had only received two doses of adjuvant Herceptin before being discontinued in December 2020. ?

## 2021-07-16 ENCOUNTER — Inpatient Hospital Stay: Payer: Medicare Other

## 2021-07-16 VITALS — BP 112/62 | HR 93 | Temp 98.4°F | Resp 20 | Wt 112.0 lb

## 2021-07-16 DIAGNOSIS — Z171 Estrogen receptor negative status [ER-]: Secondary | ICD-10-CM | POA: Diagnosis not present

## 2021-07-16 DIAGNOSIS — C787 Secondary malignant neoplasm of liver and intrahepatic bile duct: Secondary | ICD-10-CM | POA: Diagnosis not present

## 2021-07-16 DIAGNOSIS — Z853 Personal history of malignant neoplasm of breast: Secondary | ICD-10-CM | POA: Diagnosis not present

## 2021-07-16 DIAGNOSIS — C7951 Secondary malignant neoplasm of bone: Secondary | ICD-10-CM

## 2021-07-16 DIAGNOSIS — Z5112 Encounter for antineoplastic immunotherapy: Secondary | ICD-10-CM | POA: Diagnosis not present

## 2021-07-16 DIAGNOSIS — Z5111 Encounter for antineoplastic chemotherapy: Secondary | ICD-10-CM | POA: Diagnosis present

## 2021-07-16 DIAGNOSIS — Z9221 Personal history of antineoplastic chemotherapy: Secondary | ICD-10-CM | POA: Diagnosis not present

## 2021-07-16 DIAGNOSIS — Z85038 Personal history of other malignant neoplasm of large intestine: Secondary | ICD-10-CM | POA: Diagnosis not present

## 2021-07-16 DIAGNOSIS — C50412 Malignant neoplasm of upper-outer quadrant of left female breast: Secondary | ICD-10-CM | POA: Diagnosis present

## 2021-07-16 DIAGNOSIS — I10 Essential (primary) hypertension: Secondary | ICD-10-CM | POA: Diagnosis not present

## 2021-07-16 DIAGNOSIS — Z5189 Encounter for other specified aftercare: Secondary | ICD-10-CM | POA: Diagnosis not present

## 2021-07-16 DIAGNOSIS — Z79899 Other long term (current) drug therapy: Secondary | ICD-10-CM | POA: Diagnosis not present

## 2021-07-16 DIAGNOSIS — I890 Lymphedema, not elsewhere classified: Secondary | ICD-10-CM | POA: Diagnosis not present

## 2021-07-16 MED ORDER — TRASTUZUMAB-DKST CHEMO 150 MG IV SOLR
300.0000 mg | Freq: Once | INTRAVENOUS | Status: AC
  Administered 2021-07-16: 300 mg via INTRAVENOUS
  Filled 2021-07-16: qty 14.29

## 2021-07-16 MED ORDER — DIPHENHYDRAMINE HCL 25 MG PO CAPS
25.0000 mg | ORAL_CAPSULE | Freq: Once | ORAL | Status: AC
  Administered 2021-07-16: 25 mg via ORAL
  Filled 2021-07-16: qty 1

## 2021-07-16 MED ORDER — SODIUM CHLORIDE 0.9 % IV SOLN
10.0000 mg | Freq: Once | INTRAVENOUS | Status: AC
  Administered 2021-07-16: 10 mg via INTRAVENOUS
  Filled 2021-07-16: qty 10

## 2021-07-16 MED ORDER — SODIUM CHLORIDE 0.9 % IV SOLN
60.0000 mg/m2 | Freq: Once | INTRAVENOUS | Status: AC
  Administered 2021-07-16: 90 mg via INTRAVENOUS
  Filled 2021-07-16: qty 9

## 2021-07-16 MED ORDER — SODIUM CHLORIDE 0.9% FLUSH
10.0000 mL | INTRAVENOUS | Status: DC | PRN
  Administered 2021-07-16: 10 mL

## 2021-07-16 MED ORDER — HEPARIN SOD (PORK) LOCK FLUSH 100 UNIT/ML IV SOLN
500.0000 [IU] | Freq: Once | INTRAVENOUS | Status: AC | PRN
  Administered 2021-07-16: 500 [IU]

## 2021-07-16 MED ORDER — ACETAMINOPHEN 325 MG PO TABS
650.0000 mg | ORAL_TABLET | Freq: Once | ORAL | Status: AC
  Administered 2021-07-16: 650 mg via ORAL
  Filled 2021-07-16: qty 2

## 2021-07-16 MED ORDER — SODIUM CHLORIDE 0.9 % IV SOLN: Freq: Once | INTRAVENOUS | Status: AC

## 2021-07-16 MED ORDER — SODIUM CHLORIDE 0.9 % IV SOLN
420.0000 mg | Freq: Once | INTRAVENOUS | Status: AC
  Administered 2021-07-16: 420 mg via INTRAVENOUS
  Filled 2021-07-16: qty 14

## 2021-07-16 NOTE — Progress Notes (Signed)
Patient and son state understanding to return at 42 tomorrow ? ?

## 2021-07-16 NOTE — Patient Instructions (Addendum)
Trastuzumab injection for infusion °What is this medication? °TRASTUZUMAB (tras TOO zoo mab) is a monoclonal antibody. It is used to treat breast cancer and stomach cancer. °This medicine may be used for other purposes; ask your health care provider or pharmacist if you have questions. °COMMON BRAND NAME(S): Herceptin, Herzuma, KANJINTI, Ogivri, Ontruzant, Trazimera °What should I tell my care team before I take this medication? °They need to know if you have any of these conditions: °heart disease °heart failure °lung or breathing disease, like asthma °an unusual or allergic reaction to trastuzumab, benzyl alcohol, or other medications, foods, dyes, or preservatives °pregnant or trying to get pregnant °breast-feeding °How should I use this medication? °This drug is given as an infusion into a vein. It is administered in a hospital or clinic by a specially trained health care professional. °Talk to your pediatrician regarding the use of this medicine in children. This medicine is not approved for use in children. °Overdosage: If you think you have taken too much of this medicine contact a poison control center or emergency room at once. °NOTE: This medicine is only for you. Do not share this medicine with others. °What if I miss a dose? °It is important not to miss a dose. Call your doctor or health care professional if you are unable to keep an appointment. °What may interact with this medication? °This medicine may interact with the following medications: °certain types of chemotherapy, such as daunorubicin, doxorubicin, epirubicin, and idarubicin °This list may not describe all possible interactions. Give your health care provider a list of all the medicines, herbs, non-prescription drugs, or dietary supplements you use. Also tell them if you smoke, drink alcohol, or use illegal drugs. Some items may interact with your medicine. °What should I watch for while using this medication? °Visit your doctor for checks  on your progress. Report any side effects. Continue your course of treatment even though you feel ill unless your doctor tells you to stop. °Call your doctor or health care professional for advice if you get a fever, chills or sore throat, or other symptoms of a cold or flu. Do not treat yourself. Try to avoid being around people who are sick. °You may experience fever, chills and shaking during your first infusion. These effects are usually mild and can be treated with other medicines. Report any side effects during the infusion to your health care professional. Fever and chills usually do not happen with later infusions. °Do not become pregnant while taking this medicine or for 7 months after stopping it. Women should inform their doctor if they wish to become pregnant or think they might be pregnant. Women of child-bearing potential will need to have a negative pregnancy test before starting this medicine. There is a potential for serious side effects to an unborn child. Talk to your health care professional or pharmacist for more information. Do not breast-feed an infant while taking this medicine or for 7 months after stopping it. °Women must use effective birth control with this medicine. °What side effects may I notice from receiving this medication? °Side effects that you should report to your doctor or health care professional as soon as possible: °allergic reactions like skin rash, itching or hives, swelling of the face, lips, or tongue °chest pain or palpitations °cough °dizziness °feeling faint or lightheaded, falls °fever °general ill feeling or flu-like symptoms °signs of worsening heart failure like breathing problems; swelling in your legs and feet °unusually weak or tired °Side effects that usually   do not require medical attention (report to your doctor or health care professional if they continue or are bothersome): bone pain changes in taste diarrhea joint pain nausea/vomiting weight  loss This list may not describe all possible side effects. Call your doctor for medical advice about side effects. You may report side effects to FDA at 1-800-FDA-1088. Where should I keep my medication? This drug is given in a hospital or clinic and will not be stored at home. NOTE: This sheet is a summary. It may not cover all possible information. If you have questions about this medicine, talk to your doctor, pharmacist, or health care provider.  2022 Elsevier/Gold Standard (2016-05-11 00:00:00) Pertuzumab injection What is this medication? PERTUZUMAB (per TOOZ ue mab) is a monoclonal antibody. It is used to treat breast cancer. This medicine may be used for other purposes; ask your health care provider or pharmacist if you have questions. COMMON BRAND NAME(S): PERJETA What should I tell my care team before I take this medication? They need to know if you have any of these conditions: heart disease heart failure high blood pressure history of irregular heart beat recent or ongoing radiation therapy an unusual or allergic reaction to pertuzumab, other medicines, foods, dyes, or preservatives pregnant or trying to get pregnant breast-feeding How should I use this medication? This medicine is for infusion into a vein. It is given by a health care professional in a hospital or clinic setting. Talk to your pediatrician regarding the use of this medicine in children. Special care may be needed. Overdosage: If you think you have taken too much of this medicine contact a poison control center or emergency room at once. NOTE: This medicine is only for you. Do not share this medicine with others. What if I miss a dose? It is important not to miss your dose. Call your doctor or health care professional if you are unable to keep an appointment. What may interact with this medication? Interactions are not expected. Give your health care provider a list of all the medicines, herbs,  non-prescription drugs, or dietary supplements you use. Also tell them if you smoke, drink alcohol, or use illegal drugs. Some items may interact with your medicine. This list may not describe all possible interactions. Give your health care provider a list of all the medicines, herbs, non-prescription drugs, or dietary supplements you use. Also tell them if you smoke, drink alcohol, or use illegal drugs. Some items may interact with your medicine. What should I watch for while using this medication? Your condition will be monitored carefully while you are receiving this medicine. Report any side effects. Continue your course of treatment even though you feel ill unless your doctor tells you to stop. Do not become pregnant while taking this medicine or for 7 months after stopping it. Women should inform their doctor if they wish to become pregnant or think they might be pregnant. Women of child-bearing potential will need to have a negative pregnancy test before starting this medicine. There is a potential for serious side effects to an unborn child. Talk to your health care professional or pharmacist for more information. Do not breast-feed an infant while taking this medicine or for 7 months after stopping it. Women must use effective birth control with this medicine. Call your doctor or health care professional for advice if you get a fever, chills or sore throat, or other symptoms of a cold or flu. Do not treat yourself. Try to avoid being around people who  are sick. You may experience fever, chills, and headache during the infusion. Report any side effects during the infusion to your health care professional. What side effects may I notice from receiving this medication? Side effects that you should report to your doctor or health care professional as soon as possible: breathing problems chest pain or palpitations dizziness feeling faint or lightheaded fever or chills skin rash, itching or  hives sore throat swelling of the face, lips, or tongue swelling of the legs or ankles unusually weak or tired Side effects that usually do not require medical attention (report to your doctor or health care professional if they continue or are bothersome): diarrhea hair loss nausea, vomiting tiredness This list may not describe all possible side effects. Call your doctor for medical advice about side effects. You may report side effects to FDA at 1-800-FDA-1088. Where should I keep my medication? This drug is given in a hospital or clinic and will not be stored at home. NOTE: This sheet is a summary. It may not cover all possible information. If you have questions about this medicine, talk to your doctor, pharmacist, or health care provider.  2022 Elsevier/Gold Standard (2015-05-29 00:00:00) Docetaxel injection What is this medication? DOCETAXEL (doe se TAX el) is a chemotherapy drug. It targets fast dividing cells, like cancer cells, and causes these cells to die. This medicine is used to treat many types of cancers like breast cancer, certain stomach cancers, head and neck cancer, lung cancer, and prostate cancer. This medicine may be used for other purposes; ask your health care provider or pharmacist if you have questions. COMMON BRAND NAME(S): Docefrez, Taxotere What should I tell my care team before I take this medication? They need to know if you have any of these conditions: infection (especially a virus infection such as chickenpox, cold sores, or herpes) liver disease low blood counts, like low white cell, platelet, or red cell counts an unusual or allergic reaction to docetaxel, polysorbate 80, other chemotherapy agents, other medicines, foods, dyes, or preservatives pregnant or trying to get pregnant breast-feeding How should I use this medication? This drug is given as an infusion into a vein. It is administered in a hospital or clinic by a specially trained health care  professional. Talk to your pediatrician regarding the use of this medicine in children. Special care may be needed. Overdosage: If you think you have taken too much of this medicine contact a poison control center or emergency room at once. NOTE: This medicine is only for you. Do not share this medicine with others. What if I miss a dose? It is important not to miss your dose. Call your doctor or health care professional if you are unable to keep an appointment. What may interact with this medication? Do not take this medicine with any of the following medications: live virus vaccines This medicine may also interact with the following medications: aprepitant certain antibiotics like erythromycin or clarithromycin certain antivirals for HIV or hepatitis certain medicines for fungal infections like fluconazole, itraconazole, ketoconazole, posaconazole, or voriconazole cimetidine ciprofloxacin conivaptan cyclosporine dronedarone fluvoxamine grapefruit juice imatinib verapamil This list may not describe all possible interactions. Give your health care provider a list of all the medicines, herbs, non-prescription drugs, or dietary supplements you use. Also tell them if you smoke, drink alcohol, or use illegal drugs. Some items may interact with your medicine. What should I watch for while using this medication? Your condition will be monitored carefully while you are receiving this medicine.  You will need important blood work done while you are taking this medicine. Call your doctor or health care professional for advice if you get a fever, chills or sore throat, or other symptoms of a cold or flu. Do not treat yourself. This drug decreases your body's ability to fight infections. Try to avoid being around people who are sick. Some products may contain alcohol. Ask your health care professional if this medicine contains alcohol. Be sure to tell all health care professionals you are taking this  medicine. Certain medicines, like metronidazole and disulfiram, can cause an unpleasant reaction when taken with alcohol. The reaction includes flushing, headache, nausea, vomiting, sweating, and increased thirst. The reaction can last from 30 minutes to several hours. You may get drowsy or dizzy. Do not drive, use machinery, or do anything that needs mental alertness until you know how this medicine affects you. Do not stand or sit up quickly, especially if you are an older patient. This reduces the risk of dizzy or fainting spells. Alcohol may interfere with the effect of this medicine. Talk to your health care professional about your risk of cancer. You may be more at risk for certain types of cancer if you take this medicine. Do not become pregnant while taking this medicine or for 6 months after stopping it. Women should inform their doctor if they wish to become pregnant or think they might be pregnant. There is a potential for serious side effects to an unborn child. Talk to your health care professional or pharmacist for more information. Do not breast-feed an infant while taking this medicine or for 1 week after stopping it. Males who get this medicine must use a condom during sex with females who can get pregnant. If you get a woman pregnant, the baby could have birth defects. The baby could die before they are born. You will need to continue wearing a condom for 3 months after stopping the medicine. Tell your health care provider right away if your partner becomes pregnant while you are taking this medicine. This may interfere with the ability to father a child. You should talk to your doctor or health care professional if you are concerned about your fertility. What side effects may I notice from receiving this medication? Side effects that you should report to your doctor or health care professional as soon as possible: allergic reactions like skin rash, itching or hives, swelling of the face,  lips, or tongue blurred vision breathing problems changes in vision low blood counts - This drug may decrease the number of white blood cells, red blood cells and platelets. You may be at increased risk for infections and bleeding. nausea and vomiting pain, redness or irritation at site where injected pain, tingling, numbness in the hands or feet redness, blistering, peeling, or loosening of the skin, including inside the mouth signs of decreased platelets or bleeding - bruising, pinpoint red spots on the skin, black, tarry stools, nosebleeds signs of decreased red blood cells - unusually weak or tired, fainting spells, lightheadedness signs of infection - fever or chills, cough, sore throat, pain or difficulty passing urine swelling of the ankle, feet, hands Side effects that usually do not require medical attention (report to your doctor or health care professional if they continue or are bothersome): constipation diarrhea fingernail or toenail changes hair loss loss of appetite mouth sores muscle pain This list may not describe all possible side effects. Call your doctor for medical advice about side effects. You may  report side effects to FDA at 1-800-FDA-1088. Where should I keep my medication? This drug is given in a hospital or clinic and will not be stored at home. NOTE: This sheet is a summary. It may not cover all possible information. If you have questions about this medicine, talk to your doctor, pharmacist, or health care provider.  2022 Elsevier/Gold Standard (2021-01-13 00:00:00)

## 2021-07-17 ENCOUNTER — Inpatient Hospital Stay: Payer: Medicare Other

## 2021-07-17 VITALS — BP 160/76 | HR 89 | Temp 97.7°F | Resp 20 | Wt 114.0 lb

## 2021-07-17 DIAGNOSIS — Z5112 Encounter for antineoplastic immunotherapy: Secondary | ICD-10-CM | POA: Diagnosis not present

## 2021-07-17 DIAGNOSIS — C7951 Secondary malignant neoplasm of bone: Secondary | ICD-10-CM

## 2021-07-17 DIAGNOSIS — Z171 Estrogen receptor negative status [ER-]: Secondary | ICD-10-CM

## 2021-07-17 DIAGNOSIS — C50412 Malignant neoplasm of upper-outer quadrant of left female breast: Secondary | ICD-10-CM

## 2021-07-17 DIAGNOSIS — C787 Secondary malignant neoplasm of liver and intrahepatic bile duct: Secondary | ICD-10-CM

## 2021-07-17 MED ORDER — PEGFILGRASTIM-CBQV 6 MG/0.6ML ~~LOC~~ SOSY
6.0000 mg | PREFILLED_SYRINGE | Freq: Once | SUBCUTANEOUS | Status: AC
  Administered 2021-07-17: 6 mg via SUBCUTANEOUS
  Filled 2021-07-17: qty 0.6

## 2021-08-05 ENCOUNTER — Other Ambulatory Visit: Payer: Self-pay

## 2021-08-05 ENCOUNTER — Ambulatory Visit: Payer: Medicare Other

## 2021-08-05 ENCOUNTER — Inpatient Hospital Stay: Payer: Medicare Other

## 2021-08-05 ENCOUNTER — Inpatient Hospital Stay (INDEPENDENT_AMBULATORY_CARE_PROVIDER_SITE_OTHER): Payer: Medicare Other | Admitting: Hematology and Oncology

## 2021-08-05 ENCOUNTER — Encounter: Payer: Self-pay | Admitting: Hematology and Oncology

## 2021-08-05 DIAGNOSIS — C7989 Secondary malignant neoplasm of other specified sites: Secondary | ICD-10-CM

## 2021-08-05 DIAGNOSIS — C787 Secondary malignant neoplasm of liver and intrahepatic bile duct: Secondary | ICD-10-CM

## 2021-08-05 DIAGNOSIS — C50912 Malignant neoplasm of unspecified site of left female breast: Secondary | ICD-10-CM | POA: Diagnosis not present

## 2021-08-05 DIAGNOSIS — C50412 Malignant neoplasm of upper-outer quadrant of left female breast: Secondary | ICD-10-CM

## 2021-08-05 DIAGNOSIS — C7951 Secondary malignant neoplasm of bone: Secondary | ICD-10-CM

## 2021-08-05 DIAGNOSIS — Z171 Estrogen receptor negative status [ER-]: Secondary | ICD-10-CM

## 2021-08-05 LAB — HEPATIC FUNCTION PANEL
ALT: 22 U/L (ref 7–35)
AST: 37 — AB (ref 13–35)
Alkaline Phosphatase: 124 (ref 25–125)
Bilirubin, Total: 0.7

## 2021-08-05 LAB — BASIC METABOLIC PANEL
BUN: 15 (ref 4–21)
CO2: 21 (ref 13–22)
Chloride: 107 (ref 99–108)
Creatinine: 0.6 (ref 0.5–1.1)
Glucose: 120
Potassium: 4.3 mEq/L (ref 3.5–5.1)
Sodium: 135 — AB (ref 137–147)

## 2021-08-05 LAB — COMPREHENSIVE METABOLIC PANEL
Albumin: 3.7 (ref 3.5–5.0)
Calcium: 9.1 (ref 8.7–10.7)

## 2021-08-05 LAB — CBC: RBC: 3.63 — AB (ref 3.87–5.11)

## 2021-08-05 LAB — CBC AND DIFFERENTIAL
HCT: 34 — AB (ref 36–46)
Hemoglobin: 11.2 — AB (ref 12.0–16.0)
Neutrophils Absolute: 3.78
Platelets: 208 10*3/uL (ref 150–400)
WBC: 6

## 2021-08-05 NOTE — Assessment & Plan Note (Signed)
Stage IIB (T2c N1 M0) HER2 receptor positive left breast cancer, diagnosed in June 2020. This was ER/PR negative. This was treated with neoadjuvant Kadcyla/Perjeta and left mastectomy, with a complete response in breast and nodes. She had only received two doses of adjuvant Herceptin before being discontinued in December 2020. ?

## 2021-08-05 NOTE — Assessment & Plan Note (Addendum)
Liver metastases discovered 04/17/21.  Numerous heterogeneously enhancing, internally necrotic lesions in the right hepatic lobe, consistent with metastatic disease. Largest lesion measures up to 6.7 cm in hepatic segment VI, and there are at least 6 lesions. We now have biopsy proven HER2 positive recurrent breast cancer with ER/PR negative. She?is now receiving THP and is tolerating without significant difficulty. She has improvement in her liver function tests after 1 cycle and these continue to improve. ?

## 2021-08-05 NOTE — Progress Notes (Addendum)
?Patient Care Team: ?[name], MD as PCP - General (Family Medicine) ?[name], MD as Consulting Physician (Oncology) ? ?Clinic Day:  08/05/21 ? ?Referring physician: [name], MD ? ?ASSESSMENT & PLAN:  ? ?Assessment & Plan: ?Malignant neoplasm of upper-outer quadrant of left breast in female, estrogen receptor negative ([place]) ?Stage IIB (T2c N1 M0) HER2 receptor positive left breast cancer, diagnosed in June 2020. This was ER/PR negative. This was treated with neoadjuvant Kadcyla/Perjeta and left mastectomy, with a complete response in breast and nodes. She had only received two doses of adjuvant Herceptin before being discontinued in December 2020. ? ?Malignant neoplasm metastatic to liver [facility]) ?Liver metastases discovered 04/17/21.  Numerous heterogeneously enhancing, internally necrotic lesions in the right hepatic lobe, consistent with metastatic disease. Largest lesion measures up to 6.7 cm in hepatic segment VI, and there are at least 6 lesions. We now have biopsy proven HER2 positive recurrent breast cancer with ER/PR negative. She is now receiving THP and is tolerating without significant difficulty. She has improvement in her liver function tests after 1 cycle and these continue to improve. ? ?Malignant neoplasm metastatic to bone [facility]) ?Bone metastases discovered 04/17/21. Enhancing osseous lesions in the L2 vertebral body, both iliac bones, and in the sacrum, highly concerning for osseous metastases. She received her 1st dose of zoledronic acid on January 16th. ? ?Chest wall recurrence of breast cancer, left ([place]) ?Left supraclavicular lymph node measuring 2-3 cm. Also involvement of subpectoral node and chest wall skin. This is now rapidly progressive despite receiving her 1st dose of trastuzumab.  We have added in Perjeta to her current HER2 targeted therapy as well as chemotherapy with Taxotere. Skin lesions have responded well to this first cycle of treatment and she continues to  tolerate treatment well. She will proceed with treatment today and return to clinic in 3 weeks. ?  ? ?The patient understands the plans discussed today and is in agreement with them.  She knows to contact our office if she develops concerns prior to her next appointment. ? ? ? ?[name], MD  ?[facility] ?[place] ?[place] [address] ?Dept: [phone] ?Dept Fax: [phone]  ? ?[name]. ?  ? ? ?CHIEF COMPLAINT:  ?CC: An 88 year old female with history of breast cancer here for 3 week evaluation ? ?Current Treatment:  Docetaxel, Trastuzumab, Pertuzumab ? ?INTERVAL HISTORY:  ?Sarayah is here today for repeat clinical assessment. She denies fevers or chills. She denies pain. Her appetite is good. Her weight has decreased 3 pounds over last 3 weeks . ? ?I have reviewed the past medical history, past surgical history, social history and family history with the patient and they are unchanged from previous note. ? ?ALLERGIES:  has No Known Allergies. ? ?MEDICATIONS:  ?Current Outpatient Medications  ?Medication Sig Dispense Refill  ? dexamethasone (DECADRON) 4 MG tablet Take 5 tabs at the night before and 5 tab the morning of chemotherapy, every 3 weeks, by mouth (Patient not taking: Reported on 07/15/2021) 60 tablet 0  ? fish oil-omega-3 fatty acids 1000 MG capsule Take 1 g by mouth daily.    ? Gauze Pads & Dressings (TELFA NON-ADHERENT) 3"X4" PADS 1 each by Does not apply route in the morning and at bedtime. (Patient not taking: Reported on 07/15/2021) 100 each 0  ? magnesium citrate SOLN Take 0.5 Bottles by mouth as needed  for severe constipation.    ? NON FORMULARY Magic Mouth Wash ?3 parts Maalox ?2 parts Benadryl ?1 part Viscous Lidocaine ?Disp: 6oz ?Instructions: 26m orally swish and swallow every 3-4 hours  ?([facility]    ? ondansetron (ZOFRAN) 4 MG  tablet Take 1 tablet (4 mg total) by mouth every 8 (eight) hours as needed for nausea or vomiting. 20 tablet 2  ? oxyCODONE (OXY IR/ROXICODONE) 5 MG immediate release tablet Take 1 tablet (5 mg total) by mouth every 4 (four) hours as needed. 100 tablet 0  ? polyethylene glycol (MIRALAX / GLYCOLAX) 17 g packet Take 17 g by mouth as needed. constipation    ? senna (SENOKOT) 8.6 MG TABS tablet Take 1 tablet by mouth 2 (two) times daily.    ? ?No current facility-administered medications for this visit.  ? ? ?HISTORY OF PRESENT ILLNESS:  ? ?Oncology History Overview Note  ?Cancer Staging ?Malignant neoplasm of upper-outer quadrant of left breast in female, estrogen receptor negative ([facility] ?Staging form: Breast, AJCC 8th Edition ?- Clinical stage from 10/11/2018: Stage IIB (cT2, cN1, cM0, G3, ER-, PR-, HER2+) - Signed by [name] MD on 11/02/2018 ?- Clinical: No stage assigned - Unsigned ? ?  ?History of colon cancer, stage II  ?09/2005 Initial Diagnosis  ? stage II adenocarcinoma of the sigmoid colon diagnosed in May 2007 ?  ?09/21/2005 Surgery  ? She underwent surgical resection on 09/21/2005. Findings were a 5.9 x 3.8 x 1.2 cm moderate to well-differentiated adenocarcinoma penetrating the muscular wall. Forty lymph nodes negative. No vascular or lymphatic invasion. Multiple diverticula with microabscess formation and inflammation. Carcinoma present in at least 1 diverticulum. Preop CEA 5.2 with lab normal range 0 to 2.5. Preop CT scan with no obvious additional pathology.  ?  ?2007 -  Chemotherapy  ? She received oral Xeloda chemotherapy as an adjuvant. She declined treatment on a clinical trial. ? ?  ?11/12/2011 Initial Diagnosis  ? [name], stage II ?  ?12/09/2011 Procedure  ? Followup colonoscopy done on 12/09/2011. She was found to have 2 polyps which were removed. Chronic diverticulosis.  ? ?  ?Malignant neoplasm of upper-outer quadrant of left breast in female, estrogen receptor negative ([facility] [place]  ?09/26/2018  Mammogram  ? Mammogram/US of left breast 09/26/18 ?IMPRESSION:  ?1. 2.9cm irregular mass in the upper outer left breast corresponds to the palpable abnormality. This is highly suspicious for breast carcinoma.  ?2. Two adjacent abnormal left axillary  Lymph nodes suspicious for metastatic adenopathy. There is a third borderline abnormal left axillary LN with a cortex thickened to 482m  ?3. Benign right breast cyst. No evidence of right breast malignancy.  ?  ?10/11/2018 Cancer Staging  ? Staging form: Breast, AJCC 8th Edition ?- Clinical stage from 10/11/2018: Stage IIB (cT2, cN1, cM0, G3, ER-, PR-, HER2+) - Signed by FeTruitt MerleMD on 11/02/2018 ? ?  ?10/11/2018 Initial Biopsy  ? Diagnosis 10/11/18 ?1. Breast, left, needle core biopsy, upper outer left 2 o'clock ?- INVASIVE DUCTAL CARCINOMA. ?- DUCTAL CARCINOMA IN SITU. ?- LYMPHOVASCULAR INVASION IS IDENTIFIED. ?- SEE COMMENT. ?2. Lymph node, needle/core biopsy, left axilla ?- INVASIVE DUCTAL CARCINOMA. ?- SEE COMMENT. ?  ?10/11/2018 Receptors her2  ? The tumor cells are POSITIVE for Her2 (3+). ?Estrogen Receptor: 0%, NEGATIVE ?Progesterone Receptor: 0%, NEGATIVE ?Proliferation Marker Ki67: 70% ?  ?11/02/2018 Initial Diagnosis  ? Malignant neoplasm of upper-outer quadrant of left breast in female, estrogen receptor negative ([place]? ?  ?  11/15/2018 Breast MRI  ? MRI breast 11/15/18  ?IMPRESSION: ?1. 3.3 centimeter mass in the LATERAL portion of the LEFT breast ?consistent with known malignancy. ?2. There is significant non mass enhancement surrounding this mass ?and extending anteriorly into the nipple base, with largest diameter ?in the anterior to posterior axis, measuring 7.2 centimeters. ?3. If the patient would consider breast conservation, additional MR ?guided core biopsies are recommended. Consider biopsy of the ?inferior and anterior extent of the non mass enhancement to document ?extent of disease. ?4. Three enlarged LEFT axillary lymph nodes. ?5. RIGHT breast is negative. ?   ?11/15/2018 PET scan  ? PET 11/15/18 ?IMPRESSION: ?Hypermetabolic left breast lesion compatible with known primary. ?Hypermetabolic left axillary lymph nodes are consistent with ?metastatic disease. ?  ?No evid

## 2021-08-05 NOTE — Assessment & Plan Note (Signed)
Bone metastases discovered 04/17/21. Enhancing osseous lesions in the L2 vertebral body, both iliac bones, and in the sacrum, highly concerning for osseous metastases. She received her 1st dose of zoledronic acid on January 16th. ?

## 2021-08-05 NOTE — Assessment & Plan Note (Signed)
Left supraclavicular lymph node measuring 2-3 cm. Also involvement of subpectoral node and chest wall skin. This is now rapidly progressive despite receiving her 1st dose of trastuzumab.??We have added in Perjeta to her current HER2 targeted therapy as well as chemotherapy with Taxotere. Skin lesions have responded well to this first cycle of treatment and she continues to tolerate treatment well. She will proceed with treatment today and return to clinic in 3 weeks. ?

## 2021-08-06 ENCOUNTER — Inpatient Hospital Stay: Payer: Medicare Other

## 2021-08-06 ENCOUNTER — Other Ambulatory Visit: Payer: Self-pay | Admitting: Pharmacist

## 2021-08-06 ENCOUNTER — Encounter: Payer: Self-pay | Admitting: Oncology

## 2021-08-06 VITALS — BP 169/72 | HR 84 | Temp 98.3°F | Resp 18 | Ht 60.25 in | Wt 110.0 lb

## 2021-08-06 DIAGNOSIS — C7951 Secondary malignant neoplasm of bone: Secondary | ICD-10-CM

## 2021-08-06 DIAGNOSIS — C787 Secondary malignant neoplasm of liver and intrahepatic bile duct: Secondary | ICD-10-CM

## 2021-08-06 DIAGNOSIS — Z5112 Encounter for antineoplastic immunotherapy: Secondary | ICD-10-CM | POA: Diagnosis not present

## 2021-08-06 DIAGNOSIS — Z171 Estrogen receptor negative status [ER-]: Secondary | ICD-10-CM

## 2021-08-06 MED ORDER — SODIUM CHLORIDE 0.9 % IV SOLN
60.0000 mg/m2 | Freq: Once | INTRAVENOUS | Status: AC
  Administered 2021-08-06: 90 mg via INTRAVENOUS
  Filled 2021-08-06: qty 9

## 2021-08-06 MED ORDER — SODIUM CHLORIDE 0.9% FLUSH
10.0000 mL | INTRAVENOUS | Status: DC | PRN
  Administered 2021-08-06: 10 mL

## 2021-08-06 MED ORDER — DIPHENHYDRAMINE HCL 25 MG PO CAPS
25.0000 mg | ORAL_CAPSULE | Freq: Once | ORAL | Status: AC
  Administered 2021-08-06: 25 mg via ORAL

## 2021-08-06 MED ORDER — HEPARIN SOD (PORK) LOCK FLUSH 100 UNIT/ML IV SOLN
500.0000 [IU] | Freq: Once | INTRAVENOUS | Status: AC | PRN
  Administered 2021-08-06: 500 [IU]

## 2021-08-06 MED ORDER — TRASTUZUMAB-DKST CHEMO 150 MG IV SOLR
300.0000 mg | Freq: Once | INTRAVENOUS | Status: AC
  Administered 2021-08-06: 300 mg via INTRAVENOUS
  Filled 2021-08-06: qty 14.29

## 2021-08-06 MED ORDER — SODIUM CHLORIDE 0.9 % IV SOLN: Freq: Once | INTRAVENOUS | Status: AC

## 2021-08-06 MED ORDER — SODIUM CHLORIDE 0.9 % IV SOLN
10.0000 mg | Freq: Once | INTRAVENOUS | Status: AC
  Administered 2021-08-06: 10 mg via INTRAVENOUS
  Filled 2021-08-06: qty 10

## 2021-08-06 MED ORDER — ACETAMINOPHEN 325 MG PO TABS
650.0000 mg | ORAL_TABLET | Freq: Once | ORAL | Status: AC
  Administered 2021-08-06: 650 mg via ORAL

## 2021-08-06 MED ORDER — SODIUM CHLORIDE 0.9 % IV SOLN
420.0000 mg | Freq: Once | INTRAVENOUS | Status: AC
  Administered 2021-08-06: 420 mg via INTRAVENOUS
  Filled 2021-08-06: qty 14

## 2021-08-06 NOTE — Patient Instructions (Signed)
Docetaxel injection ?What is this medication? ?DOCETAXEL (doe se TAX el) is a chemotherapy drug. It targets fast dividing cells, like cancer cells, and causes these cells to die. This medicine is used to treat many types of cancers like breast cancer, certain stomach cancers, head and neck cancer, lung cancer, and prostate cancer. ?This medicine may be used for other purposes; ask your health care provider or pharmacist if you have questions. ?COMMON BRAND NAME(S): Docefrez, Taxotere ?What should I tell my care team before I take this medication? ?They need to know if you have any of these conditions: ?infection (especially a virus infection such as chickenpox, cold sores, or herpes) ?liver disease ?low blood counts, like low white cell, platelet, or red cell counts ?an unusual or allergic reaction to docetaxel, polysorbate 80, other chemotherapy agents, other medicines, foods, dyes, or preservatives ?pregnant or trying to get pregnant ?breast-feeding ?How should I use this medication? ?This drug is given as an infusion into a vein. It is administered in a hospital or clinic by a specially trained health care professional. ?Talk to your pediatrician regarding the use of this medicine in children. Special care may be needed. ?Overdosage: If you think you have taken too much of this medicine contact a poison control center or emergency room at once. ?NOTE: This medicine is only for you. Do not share this medicine with others. ?What if I miss a dose? ?It is important not to miss your dose. Call your doctor or health care professional if you are unable to keep an appointment. ?What may interact with this medication? ?Do not take this medicine with any of the following medications: ?live virus vaccines ?This medicine may also interact with the following medications: ?aprepitant ?certain antibiotics like erythromycin or clarithromycin ?certain antivirals for HIV or hepatitis ?certain medicines for fungal infections like  fluconazole, itraconazole, ketoconazole, posaconazole, or voriconazole ?cimetidine ?ciprofloxacin ?conivaptan ?cyclosporine ?dronedarone ?fluvoxamine ?grapefruit juice ?imatinib ?verapamil ?This list may not describe all possible interactions. Give your health care provider a list of all the medicines, herbs, non-prescription drugs, or dietary supplements you use. Also tell them if you smoke, drink alcohol, or use illegal drugs. Some items may interact with your medicine. ?What should I watch for while using this medication? ?Your condition will be monitored carefully while you are receiving this medicine. You will need important blood work done while you are taking this medicine. ?Call your doctor or health care professional for advice if you get a fever, chills or sore throat, or other symptoms of a cold or flu. Do not treat yourself. This drug decreases your body's ability to fight infections. Try to avoid being around people who are sick. ?Some products may contain alcohol. Ask your health care professional if this medicine contains alcohol. Be sure to tell all health care professionals you are taking this medicine. Certain medicines, like metronidazole and disulfiram, can cause an unpleasant reaction when taken with alcohol. The reaction includes flushing, headache, nausea, vomiting, sweating, and increased thirst. The reaction can last from 30 minutes to several hours. ?You may get drowsy or dizzy. Do not drive, use machinery, or do anything that needs mental alertness until you know how this medicine affects you. Do not stand or sit up quickly, especially if you are an older patient. This reduces the risk of dizzy or fainting spells. Alcohol may interfere with the effect of this medicine. ?Talk to your health care professional about your risk of cancer. You may be more at risk for certain types  of cancer if you take this medicine. ?Do not become pregnant while taking this medicine or for 6 months after  stopping it. Women should inform their doctor if they wish to become pregnant or think they might be pregnant. There is a potential for serious side effects to an unborn child. Talk to your health care professional or pharmacist for more information. Do not breast-feed an infant while taking this medicine or for 1 week after stopping it. ?Males who get this medicine must use a condom during sex with females who can get pregnant. If you get a woman pregnant, the baby could have birth defects. The baby could die before they are born. You will need to continue wearing a condom for 3 months after stopping the medicine. Tell your health care provider right away if your partner becomes pregnant while you are taking this medicine. ?This may interfere with the ability to father a child. You should talk to your doctor or health care professional if you are concerned about your fertility. ?What side effects may I notice from receiving this medication? ?Side effects that you should report to your doctor or health care professional as soon as possible: ?allergic reactions like skin rash, itching or hives, swelling of the face, lips, or tongue ?blurred vision ?breathing problems ?changes in vision ?low blood counts - This drug may decrease the number of white blood cells, red blood cells and platelets. You may be at increased risk for infections and bleeding. ?nausea and vomiting ?pain, redness or irritation at site where injected ?pain, tingling, numbness in the hands or feet ?redness, blistering, peeling, or loosening of the skin, including inside the mouth ?signs of decreased platelets or bleeding - bruising, pinpoint red spots on the skin, black, tarry stools, nosebleeds ?signs of decreased red blood cells - unusually weak or tired, fainting spells, lightheadedness ?signs of infection - fever or chills, cough, sore throat, pain or difficulty passing urine ?swelling of the ankle, feet, hands ?Side effects that usually do not  require medical attention (report to your doctor or health care professional if they continue or are bothersome): ?constipation ?diarrhea ?fingernail or toenail changes ?hair loss ?loss of appetite ?mouth sores ?muscle pain ?This list may not describe all possible side effects. Call your doctor for medical advice about side effects. You may report side effects to FDA at 1-800-FDA-1088. ?Where should I keep my medication? ?This drug is given in a hospital or clinic and will not be stored at home. ?NOTE: This sheet is a summary. It may not cover all possible information. If you have questions about this medicine, talk to your doctor, pharmacist, or health care provider. ?? 2022 Elsevier/Gold Standard (2021-01-13 00:00:00) ?Pertuzumab injection ?What is this medication? ?PERTUZUMAB (per TOOZ ue mab) is a monoclonal antibody. It is used to treat breast cancer. ?This medicine may be used for other purposes; ask your health care provider or pharmacist if you have questions. ?COMMON BRAND NAME(S): PERJETA ?What should I tell my care team before I take this medication? ?They need to know if you have any of these conditions: ?heart disease ?heart failure ?high blood pressure ?history of irregular heart beat ?recent or ongoing radiation therapy ?an unusual or allergic reaction to pertuzumab, other medicines, foods, dyes, or preservatives ?pregnant or trying to get pregnant ?breast-feeding ?How should I use this medication? ?This medicine is for infusion into a vein. It is given by a health care professional in a hospital or clinic setting. ?Talk to your pediatrician regarding the  use of this medicine in children. Special care may be needed. ?Overdosage: If you think you have taken too much of this medicine contact a poison control center or emergency room at once. ?NOTE: This medicine is only for you. Do not share this medicine with others. ?What if I miss a dose? ?It is important not to miss your dose. Call your doctor or  health care professional if you are unable to keep an appointment. ?What may interact with this medication? ?Interactions are not expected. ?Give your health care provider a list of all the medicines, herbs, n

## 2021-08-07 ENCOUNTER — Ambulatory Visit: Payer: Medicare Other

## 2021-08-07 ENCOUNTER — Inpatient Hospital Stay: Payer: Medicare Other

## 2021-08-07 VITALS — BP 178/69 | HR 86 | Temp 97.8°F | Resp 20 | Wt 111.0 lb

## 2021-08-07 DIAGNOSIS — C7951 Secondary malignant neoplasm of bone: Secondary | ICD-10-CM

## 2021-08-07 DIAGNOSIS — C50412 Malignant neoplasm of upper-outer quadrant of left female breast: Secondary | ICD-10-CM

## 2021-08-07 DIAGNOSIS — Z5112 Encounter for antineoplastic immunotherapy: Secondary | ICD-10-CM | POA: Diagnosis not present

## 2021-08-07 DIAGNOSIS — C787 Secondary malignant neoplasm of liver and intrahepatic bile duct: Secondary | ICD-10-CM

## 2021-08-07 MED ORDER — PEGFILGRASTIM-CBQV 6 MG/0.6ML ~~LOC~~ SOSY
6.0000 mg | PREFILLED_SYRINGE | Freq: Once | SUBCUTANEOUS | Status: AC
  Administered 2021-08-07: 6 mg via SUBCUTANEOUS
  Filled 2021-08-07: qty 0.6

## 2021-08-07 NOTE — Patient Instructions (Signed)

## 2021-08-09 ENCOUNTER — Encounter: Payer: Self-pay | Admitting: Oncology

## 2021-08-25 NOTE — Progress Notes (Signed)
?[name]  ?[address] ?[place],  [place]  [address] ?[phone] ? ?Clinic Day:  08/26/21 ? ?Referring physician: Melvin, Jon K, MD ? ?ASSESSMENT & PLAN:  ? ?Stage IIB (T2c N1 M0) HER2 receptor positive left breast cancer, diagnosed in June 2020. This was ER/PR negative. This was treated with neoadjuvant Kadcyla/Perjeta and left mastectomy, with a complete response in breast and nodes. She had only received two doses of adjuvant Herceptin before being discontinued in December 2020. ? ?History of stage II colon cancer, diagnosed in May 2007. This was treated with surgical resection and adjuvant chemotherapy with Xeloda. Colonoscopy from 2013 revealed 2 polyps which were resected and chronic diverticulosis. ? ?Liver metastases discovered 04/17/21.  Numerous heterogeneously enhancing, internally necrotic lesions in the right hepatic lobe, consistent with metastatic disease. Largest lesion measures up to 6.7 cm and there are at least 6 lesions. We now have biopsy proven HER2 positive recurrent breast cancer with ER/PR negative. She is receiving THP and is tolerating it without significant difficulty. She had improvement in her liver function tests after 1 cycle, and they are now normal. ? ?Bone metastases discovered 04/17/21. Enhancing osseous lesions are present in the L2 vertebral body, both iliac bones, and in the sacrum, highly concerning for osseous metastases. She received her 1st dose of zoledronic acid on January 16th. ? ?Left supraclavicular lymph node measuring 2-3 cm. Also involvement of subpectoral node and chest wall skin. This was rapidly progressive despite receiving  trastuzumab.  We have added in Perjeta and Taxotere to her current regimen and this has been very effective. ? ?Lymphedema of the left upper extremity.  It is very mild at this time ? ?Hypercalcemia, improved following zoledronic acid on January 16th. ? 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  I cannot rule out carpal tunnel syndrome. ? ? ?She has been on THP since mid February and is clearly responding clinically.  She is still very anxious and needs reassurance.  Her main concern is the disability that has developed of her left upper extremity and so we will refer her to physical therapy with a hand specialist.  We will also consult a neurologist for further evaluation of the etiology of this.  She has tolerated this regimen without much difficulty and we can clearly see an improvement in the skin lesions of the left mastectomy and the left axillary adenopathy, and improvement in her liver function tests.  Her 2 tick bites look clean and noninfected, of the back and knee.  Otherwise, we will see her back in 3 weeks with CBC, CMP and CT scans of chest, abdomen and pelvis prior to her next cycle of therapy.  She and her son understand and agree with this plan of care. I have answered their questions and she knows to call with any concerns. ? ? ?I provided 30 minutes of face-to-face time during this this encounter and > 50% was spent counseling as documented under my assessment and plan.  ? ? ?Christine H McCarty, MD ?[facility] ?[facility] ?[address] ?[place] [address] ?Dept: [phone] ?Dept Fax: [phone]  ? ?CHIEF COMPLAINT:  ?CC: Stage IIB HER2 receptor positive left breast cancer with evidence of metastatic disease ? ?Current Treatment:  Taxotere/Herceptin/Perjeta ? ? ?  HISTORY OF PRESENT ILLNESS:  ?Jasmin Lewis is a 88 y.o. female who presents for a transfer of care for there continued evaluation and management of stage IIB left breast cancer. She was originally diagnosed in June 2020 when she was able to palpate a mass of the left breast. Mammogram and ultrasound from  May 2020 revealed a 2.9 cm irregular mass in the upper outer left breast as well as two adjacent abnormal left axillary lymph nodes. Biopsy was obtained on June 3rd and revealed invasive ductal carcinoma and DCIS with lymphovascular invasion. Left axillary lymph node was positive for invasive ductal carcinoma. HER2 was positive 3+. Estrogen and progesterone receptors were both negative at 0%, and Ki67 was 70%. Breast MRI from July revealed a 3.3 cm mass of the lateral portion of the left breast as well as significant non mass enhancement surrounding this mass and extending anteriorly into the nipple base, with largest diameter in the anterior to posterior axis, measuring 7.2 centimeters. Three enlarged left axillary lymph nodes. Right breast was negative.  PET imaging from July revealed no evidence of additional metastatic involvement. She received neoadjuvant Kadcyla/Perjeta for 6 cycles. Repeat breast MRI from October revealed complete resolution of the enhancing mass and associated non mass enhancement within the left breast. Previously identified left axillary lymphadenopathy was no longer identified. She underwent left mastectomy and targeted lymph node dissection in November 2020 and final pathology from this procedure confirmed no evidence of malignancy, for a complete response. Three sentinel lymph nodes were negative for malignancy (0/3). She continued maintenance Herceptin injections for two more doses, but this was discontinued in December 2020 as she refused further ECHO scans.  ? ?Of note, she also has a history of stage II colon cancer, diagnosed in May 2007. Preop CEA was 5.2. She underwent surgical resection and surgical pathology revealed a 5.9 x 3.8 x 1.2 cm moderate to well-differentiated adenocarcinoma penetrating the muscular wall. Forty lymph nodes negative. No vascular or lymphatic invasion. Multiple diverticula with microabscess formation and inflammation. Carcinoma present in at least 1  diverticulum. She received adjuvant chemotherapy with Xeloda for 9 months. Colonoscopy from August 2013 revealed 2 polyps which were resected and chronic diverticulosis. ? ?Arraya states that she presented to [facility] ER on December 9th due to constipation and back pain with spasms. CT abdomen/pelvis from December 9th revealed new lytic lesion involving the L2 vertebral body with minimal cortical breakthrough superiorly, but with preservation of vertebral body height, concerning for metastatic disease. There are several indeterminate hepatic lesions. In addition to the lesions, there is an additional subtle 1.5 cm hypodensity in the hepatic dome. MRI abdomen from January 3rd revealed numerous heterogeneously enhancing, internally necrotic lesions in the right hepatic lobe, highly concerning for metastatic disease. Largest lesion measures up to 6.7 cm in hepatic segment VI. Enhancing osseous lesions in the L2 vertebral body, both iliac bones, and in the sacrum, highly concerning for osseous metastases. She has chronic lymphedema of the left upper extremity since November 2022 after an accident, for which she wears a compression sleeve. However, her skin is reacting to the plastic cuff of the sleeve, even though she has tried turning it inside out. She is limited in her mobility of the right upper extremity due to prior incident of dislocated shoulder. I met with her on January 6th and reviewed the imaging. I recommended further evaluation before finalizing a treatment plan. We scheduled a PET scan, MRI brain and spine, and ECHO. We also referred her to  Dr. Lilia Pro for node biopsy and port placement. When he saw her, he found a nodule in the upper left chest wall which was suspicious and biopsied this rather than the left supraclavicular node. She underwent biopsy of a chest wall nodule on January 12th with Dr. Lilia Pro. Pathology confirmed carcinoma, 5 mm nodule, involving dermis, subcutaneous tissue and lymphovascular  space, compatible with metastatic breast carcinoma. CK7 positive, estrogen staining is mostly negative but there is some faint positivity. HER2 was positive 3+. Estrogen and progesterone receptors were neg

## 2021-08-26 ENCOUNTER — Encounter: Payer: Self-pay | Admitting: Oncology

## 2021-08-26 ENCOUNTER — Inpatient Hospital Stay: Payer: Medicare Other | Attending: Oncology | Admitting: Oncology

## 2021-08-26 ENCOUNTER — Other Ambulatory Visit: Payer: Self-pay | Admitting: Oncology

## 2021-08-26 ENCOUNTER — Inpatient Hospital Stay: Payer: Medicare Other

## 2021-08-26 ENCOUNTER — Other Ambulatory Visit: Payer: Self-pay

## 2021-08-26 VITALS — BP 153/68 | HR 84 | Temp 98.0°F | Resp 18 | Ht 60.25 in | Wt 112.5 lb

## 2021-08-26 DIAGNOSIS — I890 Lymphedema, not elsewhere classified: Secondary | ICD-10-CM

## 2021-08-26 DIAGNOSIS — Z171 Estrogen receptor negative status [ER-]: Secondary | ICD-10-CM

## 2021-08-26 DIAGNOSIS — Z9221 Personal history of antineoplastic chemotherapy: Secondary | ICD-10-CM | POA: Insufficient documentation

## 2021-08-26 DIAGNOSIS — Z9012 Acquired absence of left breast and nipple: Secondary | ICD-10-CM | POA: Insufficient documentation

## 2021-08-26 DIAGNOSIS — Z5112 Encounter for antineoplastic immunotherapy: Secondary | ICD-10-CM | POA: Insufficient documentation

## 2021-08-26 DIAGNOSIS — S143XXA Injury of brachial plexus, initial encounter: Secondary | ICD-10-CM

## 2021-08-26 DIAGNOSIS — I10 Essential (primary) hypertension: Secondary | ICD-10-CM | POA: Insufficient documentation

## 2021-08-26 DIAGNOSIS — C787 Secondary malignant neoplasm of liver and intrahepatic bile duct: Secondary | ICD-10-CM | POA: Diagnosis not present

## 2021-08-26 DIAGNOSIS — Z5189 Encounter for other specified aftercare: Secondary | ICD-10-CM | POA: Insufficient documentation

## 2021-08-26 DIAGNOSIS — C50912 Malignant neoplasm of unspecified site of left female breast: Secondary | ICD-10-CM

## 2021-08-26 DIAGNOSIS — S143XXD Injury of brachial plexus, subsequent encounter: Secondary | ICD-10-CM

## 2021-08-26 DIAGNOSIS — Z5111 Encounter for antineoplastic chemotherapy: Secondary | ICD-10-CM | POA: Insufficient documentation

## 2021-08-26 DIAGNOSIS — C7951 Secondary malignant neoplasm of bone: Secondary | ICD-10-CM | POA: Diagnosis not present

## 2021-08-26 DIAGNOSIS — C50412 Malignant neoplasm of upper-outer quadrant of left female breast: Secondary | ICD-10-CM | POA: Diagnosis not present

## 2021-08-26 DIAGNOSIS — Z85038 Personal history of other malignant neoplasm of large intestine: Secondary | ICD-10-CM

## 2021-08-26 DIAGNOSIS — C779 Secondary and unspecified malignant neoplasm of lymph node, unspecified: Secondary | ICD-10-CM | POA: Insufficient documentation

## 2021-08-26 DIAGNOSIS — Z853 Personal history of malignant neoplasm of breast: Secondary | ICD-10-CM | POA: Insufficient documentation

## 2021-08-26 DIAGNOSIS — C7989 Secondary malignant neoplasm of other specified sites: Secondary | ICD-10-CM

## 2021-08-26 LAB — CBC AND DIFFERENTIAL
HCT: 36 (ref 36–46)
Hemoglobin: 11.7 — AB (ref 12.0–16.0)
Neutrophils Absolute: 3.97
Platelets: 189 10*3/uL (ref 150–400)
WBC: 6.4

## 2021-08-26 LAB — HEPATIC FUNCTION PANEL
ALT: 23 U/L (ref 7–35)
AST: 36 — AB (ref 13–35)
Alkaline Phosphatase: 90 (ref 25–125)
Bilirubin, Total: 0.5

## 2021-08-26 LAB — BASIC METABOLIC PANEL
BUN: 18 (ref 4–21)
CO2: 28 — AB (ref 13–22)
Chloride: 105 (ref 99–108)
Creatinine: 0.6 (ref 0.5–1.1)
Glucose: 80
Potassium: 4.1 mEq/L (ref 3.5–5.1)
Sodium: 140 (ref 137–147)

## 2021-08-26 LAB — COMPREHENSIVE METABOLIC PANEL
Albumin: 4 (ref 3.5–5.0)
Calcium: 9 (ref 8.7–10.7)

## 2021-08-26 LAB — CBC: RBC: 3.73 — AB (ref 3.87–5.11)

## 2021-08-27 ENCOUNTER — Telehealth: Payer: Self-pay

## 2021-08-27 ENCOUNTER — Inpatient Hospital Stay: Payer: Medicare Other

## 2021-08-27 VITALS — BP 141/69 | HR 86 | Temp 98.8°F | Resp 18 | Ht 60.25 in | Wt 112.0 lb

## 2021-08-27 DIAGNOSIS — C7951 Secondary malignant neoplasm of bone: Secondary | ICD-10-CM | POA: Diagnosis not present

## 2021-08-27 DIAGNOSIS — Z5189 Encounter for other specified aftercare: Secondary | ICD-10-CM | POA: Diagnosis not present

## 2021-08-27 DIAGNOSIS — Z9012 Acquired absence of left breast and nipple: Secondary | ICD-10-CM | POA: Diagnosis not present

## 2021-08-27 DIAGNOSIS — Z9221 Personal history of antineoplastic chemotherapy: Secondary | ICD-10-CM | POA: Diagnosis not present

## 2021-08-27 DIAGNOSIS — I10 Essential (primary) hypertension: Secondary | ICD-10-CM | POA: Diagnosis not present

## 2021-08-27 DIAGNOSIS — Z85038 Personal history of other malignant neoplasm of large intestine: Secondary | ICD-10-CM | POA: Diagnosis not present

## 2021-08-27 DIAGNOSIS — Z853 Personal history of malignant neoplasm of breast: Secondary | ICD-10-CM | POA: Diagnosis not present

## 2021-08-27 DIAGNOSIS — C50412 Malignant neoplasm of upper-outer quadrant of left female breast: Secondary | ICD-10-CM

## 2021-08-27 DIAGNOSIS — S143XXD Injury of brachial plexus, subsequent encounter: Secondary | ICD-10-CM

## 2021-08-27 DIAGNOSIS — Z5111 Encounter for antineoplastic chemotherapy: Secondary | ICD-10-CM | POA: Diagnosis present

## 2021-08-27 DIAGNOSIS — C787 Secondary malignant neoplasm of liver and intrahepatic bile duct: Secondary | ICD-10-CM

## 2021-08-27 DIAGNOSIS — Z5112 Encounter for antineoplastic immunotherapy: Secondary | ICD-10-CM | POA: Diagnosis present

## 2021-08-27 DIAGNOSIS — I890 Lymphedema, not elsewhere classified: Secondary | ICD-10-CM | POA: Diagnosis not present

## 2021-08-27 DIAGNOSIS — C779 Secondary and unspecified malignant neoplasm of lymph node, unspecified: Secondary | ICD-10-CM | POA: Diagnosis not present

## 2021-08-27 MED ORDER — HEPARIN SOD (PORK) LOCK FLUSH 100 UNIT/ML IV SOLN
500.0000 [IU] | Freq: Once | INTRAVENOUS | Status: AC | PRN
  Administered 2021-08-27: 500 [IU]

## 2021-08-27 MED ORDER — SODIUM CHLORIDE 0.9 % IV SOLN
60.0000 mg/m2 | Freq: Once | INTRAVENOUS | Status: AC
  Administered 2021-08-27: 90 mg via INTRAVENOUS
  Filled 2021-08-27: qty 9

## 2021-08-27 MED ORDER — TRASTUZUMAB-DKST CHEMO 150 MG IV SOLR
300.0000 mg | Freq: Once | INTRAVENOUS | Status: AC
  Administered 2021-08-27: 300 mg via INTRAVENOUS
  Filled 2021-08-27: qty 14.29

## 2021-08-27 MED ORDER — SODIUM CHLORIDE 0.9 % IV SOLN: Freq: Once | INTRAVENOUS | Status: AC

## 2021-08-27 MED ORDER — SODIUM CHLORIDE 0.9% FLUSH
10.0000 mL | INTRAVENOUS | Status: DC | PRN
  Administered 2021-08-27: 10 mL

## 2021-08-27 MED ORDER — ACETAMINOPHEN 325 MG PO TABS
650.0000 mg | ORAL_TABLET | Freq: Once | ORAL | Status: AC
  Administered 2021-08-27: 650 mg via ORAL
  Filled 2021-08-27: qty 2

## 2021-08-27 MED ORDER — DIPHENHYDRAMINE HCL 25 MG PO CAPS
25.0000 mg | ORAL_CAPSULE | Freq: Once | ORAL | Status: AC
  Administered 2021-08-27: 25 mg via ORAL
  Filled 2021-08-27: qty 1

## 2021-08-27 MED ORDER — SODIUM CHLORIDE 0.9 % IV SOLN
10.0000 mg | Freq: Once | INTRAVENOUS | Status: AC
  Administered 2021-08-27: 10 mg via INTRAVENOUS
  Filled 2021-08-27: qty 10

## 2021-08-27 MED ORDER — SODIUM CHLORIDE 0.9 % IV SOLN
420.0000 mg | Freq: Once | INTRAVENOUS | Status: AC
  Administered 2021-08-27: 420 mg via INTRAVENOUS
  Filled 2021-08-27: qty 14

## 2021-08-27 NOTE — Patient Instructions (Signed)
[facility]  Discharge Instructions: ?Thank you for choosing [facility] to provide your oncology and hematology care.  ?If you have a lab appointment with the Cancer Center, please go directly to the Cancer Center and check in at the registration area. ?  ?Wear comfortable clothing and clothing appropriate for easy access to any Portacath or PICC line.  ? ?We strive to give you quality time with your provider. You may need to reschedule your appointment if you arrive late (15 or more minutes).  Arriving late affects you and other patients whose appointments are after yours.  Also, if you miss three or more appointments without notifying the office, you may be dismissed from the clinic at the provider?s discretion.    ?  ?For prescription refill requests, have your pharmacy contact our office and allow 72 hours for refills to be completed.   ? ?Today you received the following chemotherapy and/or immunotherapy agents Docetaxel,Trastuzumab,Pertuzumab   ?  ?To help prevent nausea and vomiting after your treatment, we encourage you to take your nausea medication as directed. ? ?BELOW ARE SYMPTOMS THAT SHOULD BE REPORTED IMMEDIATELY: ?*FEVER GREATER THAN 100.4 F (38 ?C) OR HIGHER ?*CHILLS OR SWEATING ?*NAUSEA AND VOMITING THAT IS NOT CONTROLLED WITH YOUR NAUSEA MEDICATION ?*UNUSUAL SHORTNESS OF BREATH ?*UNUSUAL BRUISING OR BLEEDING ?*URINARY PROBLEMS (pain or burning when urinating, or frequent urination) ?*BOWEL PROBLEMS (unusual diarrhea, constipation, pain near the anus) ?TENDERNESS IN MOUTH AND THROAT WITH OR WITHOUT PRESENCE OF ULCERS (sore throat, sores in mouth, or a toothache) ?UNUSUAL RASH, SWELLING OR PAIN  ?UNUSUAL VAGINAL DISCHARGE OR ITCHING  ? ?Items with * indicate a potential emergency and should be followed up as soon as possible or go to the Emergency Department if any problems should occur. ? ?Please show the CHEMOTHERAPY ALERT CARD or IMMUNOTHERAPY ALERT CARD  at check-in to the Emergency Department and triage nurse. ? ?Should you have questions after your visit or need to cancel or reschedule your appointment, please contact [facility] [place]  Dept: [phone]  and follow the prompts.  Office hours are 8:00 a.m. to 4:30 p.m. Monday - Friday. Please note that voicemails left after 4:00 p.m. may not be returned until the following business day.  We are closed weekends and major holidays. You have access to a nurse at all times for urgent questions. Please call the main number to the clinic Dept: [phone] and follow the prompts. ? ?For any non-urgent questions, you may also contact your provider using MyChart. We now offer e-Visits for anyone 18 and older to request care online for non-urgent symptoms. For details visit [email or web address]. ?  ?Also download the MyChart app! Go to the app store, search "MyChart", open the app, select [place], and log in with your MyChart username and password. ? ?Due to Covid, a mask is required upon entering the hospital/clinic. If you do not have a mask, one will be given to you upon arrival. For doctor visits, patients may have 1 support person aged 18 or older with them. For treatment visits, patients cannot have anyone with them due to current Covid guidelines and our immunocompromised population.  ? ? ?

## 2021-08-27 NOTE — Telephone Encounter (Signed)
Referral sent to Physical Therapy and Hand Specialist in [place] and to [facility]. ?

## 2021-08-27 NOTE — Telephone Encounter (Signed)
-----   Message from [name], MD sent at 08/26/2021  2:30 PM EDT ----- ?Regarding: refer ?Pls refer to Physical therapy and Hand specialist, they are in the [facility] building down the street. 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  Evaluate and treat (Rx given to pt & copy in box) ? ?Also ref Neuro in [place] for same thing but I explained it could take months. ? ?

## 2021-08-27 NOTE — Progress Notes (Signed)
1401: PT STABLE AT TIME OF DISCHARGE  

## 2021-08-27 NOTE — Telephone Encounter (Signed)
-----   Message from [name], MD sent at 08/26/2021  2:30 PM EDT ----- ?Regarding: refer ?Pls refer to Physical therapy and Hand specialist, they are in the [facility] building down the street. 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  Evaluate and treat (Rx given to pt & copy in box) ? ?Also ref Neuro in [place] for same thing but I explained it could take months. ? ?

## 2021-08-28 ENCOUNTER — Inpatient Hospital Stay: Payer: Medicare Other

## 2021-08-28 VITALS — BP 152/78 | HR 86 | Temp 98.2°F | Resp 18

## 2021-08-28 DIAGNOSIS — Z5112 Encounter for antineoplastic immunotherapy: Secondary | ICD-10-CM | POA: Diagnosis not present

## 2021-08-28 DIAGNOSIS — C7951 Secondary malignant neoplasm of bone: Secondary | ICD-10-CM

## 2021-08-28 DIAGNOSIS — Z171 Estrogen receptor negative status [ER-]: Secondary | ICD-10-CM

## 2021-08-28 DIAGNOSIS — C787 Secondary malignant neoplasm of liver and intrahepatic bile duct: Secondary | ICD-10-CM

## 2021-08-28 MED ORDER — PEGFILGRASTIM-CBQV 6 MG/0.6ML ~~LOC~~ SOSY
6.0000 mg | PREFILLED_SYRINGE | Freq: Once | SUBCUTANEOUS | Status: AC
  Administered 2021-08-28: 6 mg via SUBCUTANEOUS
  Filled 2021-08-28: qty 0.6

## 2021-09-05 ENCOUNTER — Encounter: Payer: Self-pay | Admitting: Oncology

## 2021-09-07 ENCOUNTER — Other Ambulatory Visit: Payer: Self-pay | Admitting: Pharmacist

## 2021-09-07 DIAGNOSIS — C7951 Secondary malignant neoplasm of bone: Secondary | ICD-10-CM

## 2021-09-14 LAB — BASIC METABOLIC PANEL
BUN: 14 (ref 4–21)
CO2: 25 — AB (ref 13–22)
Chloride: 106 (ref 99–108)
Creatinine: 0.5 (ref 0.5–1.1)
Glucose: 96
Potassium: 4.3 mEq/L (ref 3.5–5.1)
Sodium: 138 (ref 137–147)

## 2021-09-14 LAB — HEPATIC FUNCTION PANEL
ALT: 23 U/L (ref 7–35)
AST: 45 — AB (ref 13–35)
Alkaline Phosphatase: 81 (ref 25–125)
Bilirubin, Total: 0.6

## 2021-09-14 LAB — CBC: RBC: 3.78 — AB (ref 3.87–5.11)

## 2021-09-14 LAB — CBC AND DIFFERENTIAL
HCT: 36 (ref 36–46)
Hemoglobin: 11.7 — AB (ref 12.0–16.0)
Neutrophils Absolute: 5.88
Platelets: 181 10*3/uL (ref 150–400)
WBC: 8.4

## 2021-09-14 LAB — COMPREHENSIVE METABOLIC PANEL
Albumin: 4.1 (ref 3.5–5.0)
Calcium: 8.7 (ref 8.7–10.7)

## 2021-09-15 NOTE — Progress Notes (Signed)
Jasmin Lewis  [address] [place],  [place]  [address] [phone]  Clinic Day: 09/16/21  Referring physician: [name], MD  ASSESSMENT & PLAN:   Stage IIB (T2c N1 M0) HER2 receptor positive left breast cancer, diagnosed in June 2020. This was ER/PR negative. This was treated with neoadjuvant Kadcyla/Perjeta and left mastectomy, with a complete response in breast and nodes. She had only received two doses of adjuvant Herceptin before being discontinued in December 2020.  History of stage II colon cancer, diagnosed in May 2007. This was treated with surgical resection and adjuvant chemotherapy with Xeloda. Colonoscopy from 2013 revealed 2 polyps which were resected and chronic diverticulosis.  Liver metastases discovered 04/17/21.  Numerous heterogeneously enhancing, internally necrotic lesions in the right hepatic lobe, consistent with metastatic disease. Largest lesion measures up to 6.7 cm and there are at least 6 lesions. We now have biopsy proven HER2 positive recurrent breast cancer with ER/PR negative. She is receiving THP and is tolerating it without significant difficulty. She had improvement in her liver function tests after 1 cycle, and they are now normal.  CT scan now shows excellent response with significant decrease in the size of her liver lesions.  Bone metastases discovered 04/17/21. Enhancing osseous lesions are present in the L2 vertebral body, both iliac bones, and in the sacrum, highly concerning for osseous metastases. She received her 1st dose of zoledronic acid on January 16th.  I once again today explained to her and her son the reasoning for this medicine.  Her widespread bone metastases show an interval increase in sclerosis of previously lytic lesions consistent with treatment response.  Left supraclavicular lymph node measuring 2-3 cm. Also involvement of subpectoral node and chest wall skin. This was rapidly progressive despite  receiving  trastuzumab.  We have added in Perjeta and Taxotere to her current regimen and this has been very effective.  The CT scan also shows good response of her adenopathy with resolution of the previous bulky left axillary and subpectoral adenopathy.  Lymphedema of the left upper extremity.  It is very mild at this time  Hypercalcemia, now resolved.  Numbness and paresthesias of the left hand which I feel is related to her spine metastasis involvement at T5.  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  I cannot rule out carpal tunnel syndrome.  We referred her to physical therapy and hand specialist but she feels she has had very little benefit and so we will stop that.   She has been on THP since mid February and is clearly responding well by CT scan.  I reviewed the results of her scan in detail with her and her son and answered their questions.  I explained she will need to continue this therapy.  They have asked about her magnesium level and so I will add that to the labs next time.  She is still very anxious and needs reassurance.  Her main concern is the disability that has developed of her left upper extremity.  That refer her to we will also consult a neurologist for further evaluation of the etiology of this.  She has tolerated this regimen without much difficulty and we can clearly see an improvement in the skin lesions of the left mastectomy and the left axillary adenopathy, and improvement in her liver function tests.  We will see her back in 3 weeks with CBC, CMP  and CT scans of chest, abdomen and pelvis prior to her next cycle of therapy.  She and her son understand and agree with this plan of care. I have answered their questions and she knows to call with any concerns.   I provided 30 minutes of face-to-face time during this this encounter and > 50% was spent counseling as documented under my assessment and plan.    Jasmin Kaplan, MD [facility] [address] [place] [address] Dept: [phone] Dept Fax: [phone]   CHIEF COMPLAINT:  CC: Stage IIB HER2 receptor positive left breast cancer with evidence of metastatic disease  Current Treatment:  Taxotere/Herceptin/Perjeta   HISTORY OF PRESENT ILLNESS:  Jasmin Lewis is a 88 y.o. female who presents for a transfer of care for there continued evaluation and management of stage IIB left breast cancer. She was originally diagnosed in June 2020 when she was able to palpate a mass of the left breast. Mammogram and ultrasound from May 2020 revealed a 2.9 cm irregular mass in the upper outer left breast as well as two adjacent abnormal left axillary lymph nodes. Biopsy was obtained on June 3rd and revealed invasive ductal carcinoma and DCIS with lymphovascular invasion. Left axillary lymph node was positive for invasive ductal carcinoma. HER2 was positive 3+. Estrogen and progesterone receptors were both negative at 0%, and Ki67 was 70%. Breast MRI from July revealed a 3.3 cm mass of the lateral portion of the left breast as well as significant non mass enhancement surrounding this mass and extending anteriorly into the nipple base, with largest diameter in the anterior to posterior axis, measuring 7.2 centimeters. Three enlarged left axillary lymph nodes. Right breast was negative.  PET imaging from July revealed no evidence of additional metastatic involvement. She received neoadjuvant Kadcyla/Perjeta for 6 cycles. Repeat breast MRI from October revealed complete resolution of the enhancing mass and associated non mass enhancement within the left breast. Previously identified left axillary lymphadenopathy was no longer identified. She underwent left mastectomy and targeted lymph node dissection in November 2020 and final pathology from this procedure confirmed no evidence of malignancy, for a  complete response. Three sentinel lymph nodes were negative for malignancy (0/3). She continued maintenance Herceptin injections for two more doses, but this was discontinued in December 2020 as she refused further ECHO scans.   Of note, she also has a history of stage II colon cancer, diagnosed in May 2007. Preop CEA was 5.2. She underwent surgical resection and surgical pathology revealed a 5.9 x 3.8 x 1.2 cm moderate to well-differentiated adenocarcinoma penetrating the muscular wall. Forty lymph nodes negative. No vascular or lymphatic invasion. Multiple diverticula with microabscess formation and inflammation. Carcinoma present in at least 1 diverticulum. She received adjuvant chemotherapy with Xeloda for 9 months. Colonoscopy from August 2013 revealed 2 polyps which were resected and chronic diverticulosis.  Brit states that she presented to [facility] on December 9th due to constipation and back pain with spasms. CT abdomen/pelvis from December 9th revealed new lytic lesion involving the L2 vertebral body with minimal cortical breakthrough superiorly, but with preservation of vertebral body height, concerning for metastatic disease. There are several indeterminate hepatic lesions. In addition to the lesions, there is an additional subtle 1.5 cm hypodensity in the hepatic dome. MRI abdomen from January 3rd revealed numerous heterogeneously enhancing, internally necrotic lesions in the right hepatic lobe, highly concerning for metastatic disease. Largest lesion measures up to 6.7  cm in hepatic segment VI. Enhancing osseous lesions in the L2 vertebral body, both iliac bones, and in the sacrum, highly concerning for osseous metastases. She has chronic lymphedema of the left upper extremity since November 2022 after an accident, for which she wears a compression sleeve. However, her skin is reacting to the plastic cuff of the sleeve, even though she has tried turning it inside out. She is limited in her  mobility of the right upper extremity due to prior incident of dislocated shoulder. I met with her on January 6th and reviewed the imaging. I recommended further evaluation before finalizing a treatment plan. We scheduled a PET scan, MRI brain and spine, and ECHO. We also referred her to Dr. Lilia Pro for node biopsy and port placement. When he saw her, he found a nodule in the upper left chest wall which was suspicious and biopsied this rather than the left supraclavicular node. She underwent biopsy of a chest wall nodule on January 12th with Dr. Lilia Pro. Pathology confirmed carcinoma, 5 mm nodule, involving dermis, subcutaneous tissue and lymphovascular space, compatible with metastatic breast carcinoma. CK7 positive, estrogen staining is mostly negative but there is some faint positivity. HER2 was positive 3+. Estrogen and progesterone receptors were negative. Ki67 was 70%.   INTERVAL HISTORY:  I have reviewed her chart and materials related to her cancer extensively and collaborated history with the patient. Summary of oncologic history is as follows: Oncology History Overview Note  Cancer Staging Malignant neoplasm of upper-outer quadrant of left breast in female, estrogen receptor negative ([place]) Staging form: Breast, AJCC 8th Edition - Clinical stage from 10/11/2018: Stage IIB (cT2, cN1, cM0, G3, ER-, PR-, HER2+) - Signed by Truitt Merle, MD on 11/02/2018 - Clinical: No stage assigned - Unsigned    History of colon cancer, stage II  09/2005 Initial Diagnosis   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 Findings were a 5.9 x 3.8 x 1.2 cm moderate to well-differentiated adenocarcinoma penetrating the muscular wall. Forty lymph nodes negative. No vascular or lymphatic invasion. Multiple diverticula with microabscess formation and inflammation. Carcinoma present in at least 1 diverticulum. Preop CEA 5.2 with lab  normal range 0 to 2.5. Preop CT scan with no obvious additional pathology.    2007 -  Chemotherapy   She received oral Xeloda chemotherapy as an adjuvant. She declined treatment on a clinical trial.    11/12/2011 Initial Diagnosis   [name], stage II   12/09/2011 Procedure   Followup colonoscopy done on 12/09/2011. She was found to have 2 polyps which were removed. Chronic diverticulosis.     Malignant neoplasm of upper-outer quadrant of left breast in female, estrogen receptor negative ([place])  09/26/2018 Mammogram   Mammogram/US of left breast 09/26/18 IMPRESSION:  1. 2.9cm irregular mass in the upper outer left breast corresponds to the palpable abnormality. This is highly suspicious for breast carcinoma.  2. Two adjacent abnormal left axillary  Lymph nodes suspicious for metastatic adenopathy. There is a third borderline abnormal left axillary LN with a cortex thickened to 81m.  3. Benign right breast cyst. No evidence of right breast malignancy.    10/11/2018 Cancer Staging   Staging form: Breast, AJCC 8th Edition - Clinical stage from 10/11/2018: Stage IIB (cT2, cN1, cM0, G3, ER-, PR-, HER2+) - Signed by [name] MD on 11/02/2018    10/11/2018 Initial Biopsy   Diagnosis 10/11/18 1. Breast, left,  needle core biopsy, upper outer left 2 o'clock - INVASIVE DUCTAL CARCINOMA. - DUCTAL CARCINOMA IN SITU. - LYMPHOVASCULAR INVASION IS IDENTIFIED. - SEE COMMENT. 2. Lymph node, needle/core biopsy, left axilla - INVASIVE DUCTAL CARCINOMA. - SEE COMMENT.   10/11/2018 Receptors her2   The tumor cells are POSITIVE for Her2 (3+). Estrogen Receptor: 0%, NEGATIVE Progesterone Receptor: 0%, NEGATIVE Proliferation Marker Ki67: 70%   11/02/2018 Initial Diagnosis   Malignant neoplasm of upper-outer quadrant of left breast in female, estrogen receptor negative ([place])    11/15/2018 Breast MRI   MRI breast 11/15/18  IMPRESSION: 1. 3.3 centimeter mass in the LATERAL portion of the LEFT  breast consistent with known malignancy. 2. There is significant non mass enhancement surrounding this mass and extending anteriorly into the nipple base, with largest diameter in the anterior to posterior axis, measuring 7.2 centimeters. 3. If the patient would consider breast conservation, additional MR guided core biopsies are recommended. Consider biopsy of the inferior and anterior extent of the non mass enhancement to document extent of disease. 4. Three enlarged LEFT axillary lymph nodes. 5. RIGHT breast is negative.   11/15/2018 PET scan   PET 11/15/18 IMPRESSION: Hypermetabolic left breast lesion compatible with known primary. Hypermetabolic left axillary lymph nodes are consistent with metastatic disease.   No evidence for additional hypermetabolic metastatic involvement in the neck, chest, abdomen, or pelvis.   11/17/2018 - 03/01/2019 Chemotherapy   Neo-adjuvant Kadcyla and perjeta q3weeks for 6 cycles starting 11/17/18. Stopped before surgery    11/29/2018 Pathology Results   Diagnosis 1. Breast, left, needle core biopsy, inferior anterior (cylinder clip) - INVASIVE DUCTAL CARCINOMA. - DUCTAL CARCINOMA IN SITU. - LYMPHOVASCULAR INVASION IS IDENTIFIED. - SEE COMMENT. 2. Breast, left, needle core biopsy, central posterior (barbell clip) - INVASIVE DUCTAL CARCINOMA. - LYMPHOVASCULAR INVASION IS IDENTIFIED.   02/12/2019 Breast MRI   IMPRESSION: 1. Complete resolution of previously identified enhancing mass and associated non mass enhancement within the left breast. This is consistent with excellent response to chemotherapy. No residual or suspicious findings are identified. 2. No MRI evidence of malignancy on the right. 3. Previously identified left axillary lymphadenopathy not definitively seen on today's study. However, this may be due to decreased field-of-view compared to prior study.     03/14/2019 Cancer Staging   Staging form: Breast, AJCC 8th Edition -  Pathologic stage from 03/14/2019: pT0, pN0, cM0, GX, ER: Unknown, PR: Unknown, HER2: Not Assessed - Signed by Truitt Merle, MD on 03/28/2019    03/14/2019 Surgery   LEFT MASTECTOMY WITH TARGETED LYMPH NODE DISSECTION and Left Axillary Sentinel Lymph  Node Biopsy by Dr [name] 03/14/19    03/14/2019 Pathology Results   FINAL MICROSCOPIC DIAGNOSIS:   A. LYMPH NODE, LEFT, SENTINEL, BIOPSY:  - There is no evidence of carcinoma in 1 of 1 lymph node (0/1).   B. BREAST, LEFT, MASTECTOMY:  - Benign breast parenchyma with treatment-related changes.  - There is no evidence of malignancy.  - See oncology table below.   C. LYMPH NODE, LEFT #1, SENTINEL, BIOPSY:  - There is no evidence of carcinoma in 1 of 1 lymph node (0/1).   D. LYMPH NODE, LEFT #2, SENTINEL, BIOPSY:  - There is no evidence of carcinoma in 1 of 1 lymph node (0/1).     04/04/2019 - 04/25/2019 Chemotherapy   Maintenance Herceptin injections every 3 weeks starting 04/03/19 to complete 1 year of treatment that was started in 11/2018. Stopped after 2nd dose as she will not be repeating Echos.  06/03/2021 - 06/24/2021 Chemotherapy   Patient is on Treatment Plan : BREAST Trastuzumab q21d X 11 Cycles      06/25/2021 -  Chemotherapy   Patient is on Treatment Plan : BREAST Docetaxel + Trastuzumab + Pertuzumab (THP) q21d      Malignant neoplasm metastatic to liver ([place])  04/17/2021 Imaging   CT ABDOMEN AND PELVIS WITH CONTRAST: -New lytic lesion involving the L2 vertebral body (6:72, 2:26) with minimal cortical breakthrough superiorly, but with preservation of vertebral body height, concerning for metastatic disease.  -There are several indeterminate hepatic lesions as described above. In addition to the lesions described above, there is an additional subtle 1.5 cm hypodensity in the hepatic dome (2:7). Given clinical history and presence of a new L2 lesion, leading differential consideration is metastatic disease. Recommend further evaluation  with MRI of the abdomen with and without contrast.   05/12/2021 Imaging   MRI ABDOMEN WITH AND WITHOUT CONTRAST: Numerous heterogeneously enhancing, internally necrotic lesions in the right hepatic lobe, highly concerning for metastatic disease. Largest lesion measures up to 6.7 cm in hepatic segment VI.   Enhancing osseous lesions in the L2 vertebral body, both iliac bones, and in the sacrum, highly concerning for osseous metastases.   05/15/2021 Initial Diagnosis   Liver metastases ([place])    05/27/2021 PET scan   1. Widespread recurrent/metastatic disease in this patient who is  status post bilateral mastectomy. Left chest wall recurrence with  left axillary/subpectoral, supraclavicular nodal metastasis.  2. Hepatic, left adrenal, and widespread osseous metastasis.  3. Incidental findings, including: Right nephrolithiasis. Tiny  hiatal hernia.    05/27/2021 Imaging   MRI LUMBAR AND THORACIC SPINE: Thoracic spine:  Osseous metastatic disease involving each level. The most dramatic deposits are at T4 and T6 where there is extraosseous/epidural tumor. No cord compression but there is foraminal impingement on the left at T6-7 and on the right at T3-4. Early dorsal epidural tumor at the level of T10 and T3.   Lumbar spine:  1. Widespread osseous metastatic disease with largest deposit  replacing the L2 body where there is a compression fracture with  mild height loss. Also at this level is early epidural tumor  extension likely affecting both L2-3 foramina.  2. 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    05/27/2021 Imaging   MRI HEAD WITH AND WITHOUT CONTRAST: Negative for metastatic disease to the brain or calvarium.   06/03/2021 - 06/24/2021 Chemotherapy   Patient is on Treatment Plan : BREAST Trastuzumab q21d X 11 Cycles      06/25/2021 -  Chemotherapy   Patient is on Treatment Plan : BREAST Docetaxel + Trastuzumab + Pertuzumab (THP) q21d      Malignant  neoplasm metastatic to bone ([place])  04/17/2021 Imaging   CT ABDOMEN AND PELVIS WITH CONTRAST: -New lytic lesion involving the L2 vertebral body (6:72, 2:26) with minimal cortical breakthrough superiorly, but with preservation of vertebral body height, concerning for metastatic disease.  -There are several indeterminate hepatic lesions as described above. In addition to the lesions described above, there is an additional subtle 1.5 cm hypodensity in the hepatic dome (2:7). Given clinical history and presence of a new L2 lesion, leading differential consideration is metastatic disease. Recommend further evaluation with MRI of the abdomen with and without contrast.   05/12/2021 Imaging   MRI ABDOMEN WITH AND WITHOUT CONTRAST: Numerous heterogeneously enhancing, internally necrotic lesions in the right hepatic lobe, highly concerning for metastatic disease. Largest lesion measures up  to 6.7 cm in hepatic segment VI.   Enhancing osseous lesions in the L2 vertebral body, both iliac bones, and in the sacrum, highly concerning for osseous metastases.   05/15/2021 Initial Diagnosis   Bone metastases ([place])    05/27/2021 PET scan   1. Widespread recurrent/metastatic disease in this patient who is  status post bilateral mastectomy. Left chest wall recurrence with  left axillary/subpectoral, supraclavicular nodal metastasis.  2. Hepatic, left adrenal, and widespread osseous metastasis.  3. Incidental findings, including: Right nephrolithiasis. Tiny  hiatal hernia.    05/27/2021 Imaging   MRI LUMBAR AND THORACIC SPINE: Thoracic spine:  Osseous metastatic disease involving each level. The most dramatic deposits are at T4 and T6 where there is extraosseous/epidural tumor. No cord compression but there is foraminal impingement on the left at T6-7 and on the right at T3-4. Early dorsal epidural tumor at the level of T10 and T3.   Lumbar spine:  1. Widespread osseous metastatic disease with largest deposit   replacing the L2 body where there is a compression fracture with  mild height loss. Also at this level is early epidural tumor  extension likely affecting both L2-3 foramina.  2. 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    05/27/2021 Imaging   MRI HEAD WITH AND WITHOUT CONTRAST: Negative for metastatic disease to the brain or calvarium.   06/03/2021 - 06/24/2021 Chemotherapy   Patient is on Treatment Plan : BREAST Trastuzumab q21d X 11 Cycles      06/25/2021 -  Chemotherapy   Patient is on Treatment Plan : BREAST Docetaxel + Trastuzumab + Pertuzumab (THP) q21d       Shakesha was initially treated with single agent trastuzumab for her widely metastatic cancer to skin, liver and bone.  She did not respond sufficiently and her regimen was changed to THP, that is Taxotere/Herceptin/Perjeta. She states that she tolerated without significant difficulty.  She had a dramatic difference in her skin lesions of the left mastectomy with just a few residual lesions.  Her left axillary adenopathy has resolved.  Her CT scan shows an excellent response, with resolution of the previous bulky left axillary and subpectoral lymphadenopathy.  Multiple liver lesions are decreased in size significantly and the osseous metastatic disease shows an increase in sclerosis of the previous lytic lesions, consistent with treatment response.  She does have a new wedge deformity of T6, age indeterminant as well as an old wedge deformity of L2 which is mildly increased.  She still has mild left upper extremity lymphedema but less than 1+ at this time.  However he has persistent disability of her hand and inability to use her fingers for fine motor skills.  I suspect this may be from brachial plexus damage from her metastatic cancer.  She feels the physical therapy was of limited if any benefit.  We will refer her to a neurologist to further evaluate the neuropathy to help [place] sort out whether this is from  the spinal cord, brachial plexus, or lymphedema.  It is possible this could be carpal tunnel syndrome.  White count is normal at 8.4, and hemoglobin is just mildly low at 11.7.  Chemistries are unremarkable and her liver function tests are nearly normal now with an SGOT of 45.  Her  appetite comes and goes, but she has no taste for food.  She complains of watering of the eyes.  She denies fever, chills or other signs of infection.  She denies nausea, vomiting, bowel issues,  or abdominal pain.  She denies sore throat, cough, dyspnea, or chest pain. She is accompanied by her son Suezanne Jacquet.  She had an echocardiogram in January and so has agreed to have one again in July, as she does not feel it is necessary to do every 3 months according to her brother  HISTORY:   Allergies: No Known Allergies  Current Medications: Current Outpatient Medications  Medication Sig Dispense Refill   dexamethasone (DECADRON) 4 MG tablet Take 5 tabs at the night before and 5 tab the morning of chemotherapy, every 3 weeks, by mouth (Patient not taking: Reported on 07/15/2021) 60 tablet 0   fish oil-omega-3 fatty acids 1000 MG capsule Take 1 g by mouth daily.     Gauze Pads & Dressings (TELFA NON-ADHERENT) 3"X4" PADS 1 each by Does not apply route in the morning and at bedtime. (Patient not taking: Reported on 07/15/2021) 100 each 0   magnesium citrate SOLN Take 0.5 Bottles by mouth as needed for severe constipation.     NON FORMULARY Magic Mouth Wash 3 parts Maalox 2 parts Benadryl 1 part Viscous Lidocaine Disp: 6oz Instructions: 45m orally swish and swallow every 3-4 hours  (Called into [facility] 07/06/21     ondansetron (ZOFRAN) 4 MG tablet Take 1 tablet (4 mg total) by mouth every 8 (eight) hours as needed for nausea or vomiting. 20 tablet 2   oxyCODONE (OXY IR/ROXICODONE) 5 MG immediate release tablet Take 1 tablet (5 mg total) by mouth every 4 (four) hours as needed. 100 tablet 0   polyethylene glycol (MIRALAX /  GLYCOLAX) 17 g packet Take 17 g by mouth as needed. constipation     senna (SENOKOT) 8.6 MG TABS tablet Take 1 tablet by mouth 2 (two) times daily.     No current facility-administered medications for this visit.    REVIEW OF SYSTEMS:  Review of Systems  Constitutional: Negative.  Negative for appetite change, chills, fatigue, fever and unexpected weight change.  HENT:  Negative.    Eyes: Negative.   Respiratory: Negative.  Negative for chest tightness, cough, hemoptysis, shortness of breath and wheezing.   Cardiovascular: Negative.  Negative for chest pain, leg swelling and palpitations.  Gastrointestinal: Negative.  Negative for abdominal distention, abdominal pain, blood in stool, constipation, diarrhea, nausea and vomiting.  Endocrine: Negative.   Genitourinary: Negative.  Negative for difficulty urinating, dysuria, frequency and hematuria.   Musculoskeletal: Negative.  Negative for arthralgias, back pain, flank pain, gait problem and myalgias.       Lymphedema of the left upper extremity  Skin: Negative.   Neurological:  Positive for numbness (neuropathy and paresthesias of the left hand). Negative for dizziness, extremity weakness, gait problem, headaches, light-headedness, seizures and speech difficulty.  Hematological: Negative.   Psychiatric/Behavioral: Negative.  Negative for depression and sleep disturbance. The patient is not nervous/anxious.     VITALS:  Blood pressure (!) 166/71, pulse 81, temperature 97.9 F (36.6 C), temperature source Oral, resp. rate 18, height 5' 0.25" (1.53 m), weight 113 lb 12.8 oz (51.6 kg), SpO2 96 %.  Wt Readings from Last 3 Encounters:  09/18/21 115 lb (52.2 kg)  09/17/21 114 lb 8 oz (51.9 kg)  09/16/21 113 lb 12.8 oz (51.6 kg)    Body mass index is 22.04 kg/m.  Performance status (ECOG): 1 - Symptomatic but completely ambulatory  PHYSICAL EXAM:  Physical Exam Constitutional:      General: She is not in acute distress.    Appearance:  Normal appearance. She is normal weight.  HENT:     Head: Normocephalic and atraumatic.  Eyes:     General: No scleral icterus.    Extraocular Movements: Extraocular movements intact.     Conjunctiva/sclera: Conjunctivae normal.     Pupils: Pupils are equal, round, and reactive to light.  Cardiovascular:     Rate and Rhythm: Normal rate and regular rhythm.     Pulses: Normal pulses.     Heart sounds: Normal heart sounds. No murmur heard.   No friction rub. No gallop.  Pulmonary:     Effort: Pulmonary effort is normal. No respiratory distress.     Breath sounds: Normal breath sounds.  Chest:  Breasts:    Right: Normal.     Left: Absent.     Comments: She has a few tiny barely palpable residual lesions of the skin and the axillary mass has resolved. Abdominal:     General: Bowel sounds are normal. There is no distension.     Palpations: Abdomen is soft. There is no hepatomegaly, splenomegaly or mass.     Tenderness: There is no abdominal tenderness.  Musculoskeletal:        General: Normal range of motion.     Cervical back: Normal range of motion and neck supple.     Right lower leg: No edema.     Left lower leg: No edema.  Lymphadenopathy:     Comments: Left supraclavicular adenopathy is resolved  Skin:    General: Skin is warm and dry.  Neurological:     Mental Status: She is alert and oriented to person, place, and time. Mental status is at baseline.     Motor: Weakness present.     Comments: She has weakness and decreased coordination of her left hand associated with paresthesias  Psychiatric:        Mood and Affect: Mood normal.        Behavior: Behavior normal.        Thought Content: Thought content normal.        Judgment: Judgment normal.    LABS:      Latest Ref Rng & Units 09/14/2021   12:00 AM 08/26/2021   12:00 AM 08/05/2021   12:00 AM  CBC  WBC  8.4      6.4      6.0       Hemoglobin 12.0 - 16.0 11.7      11.7      11.2       Hematocrit 36 - 46 36       36      34       Platelets 150 - 400 K/uL 181      189      208          This result is from an external source.      Latest Ref Rng & Units 09/14/2021   12:00 AM 08/26/2021   12:00 AM 08/05/2021   12:00 AM  CMP  BUN 4 - 21 14      18      15        Creatinine 0.5 - 1.1 0.5      0.6      0.6       Sodium 137 - 147 138      140      135       Potassium 3.5 - 5.1 mEq/L 4.3      4.1  4.3       Chloride 99 - 108 106      105      107       CO2 13 - 22 25      28      21        Calcium 8.7 - 10.7 8.7      9.0      9.1       Alkaline Phos 25 - 125 81      90      124       AST 13 - 35 45      36      37       ALT 7 - 35 U/L 23      23      22           This result is from an external source.     Lab Results  Component Value Date   CEA1 1.8 06/03/2021   /  CEA  Date Value Ref Range Status  06/03/2021 1.8 0.0 - 4.7 ng/mL Final    Comment:    (NOTE)                             Nonsmokers          <3.9                             Smokers             <5.6 Roche [employer] Electrochemiluminescence Immunoassay (ECLIA) Values obtained with different assay methods or kits cannot be used interchangeably.  Results cannot be interpreted as absolute evidence of the presence or absence of malignant disease. Performed At: [facility] [place], [place] [id number] Rush Farmer MD OY:[id number]     Lab Results  Component Value Date   LDH 186 11/12/2011   LDH 187 09/08/2010   LDH 171 09/09/2009    STUDIES:  No results found.

## 2021-09-16 ENCOUNTER — Inpatient Hospital Stay: Payer: Medicare Other | Attending: Oncology | Admitting: Oncology

## 2021-09-16 ENCOUNTER — Other Ambulatory Visit: Payer: Self-pay | Admitting: Oncology

## 2021-09-16 ENCOUNTER — Encounter: Payer: Self-pay | Admitting: Oncology

## 2021-09-16 VITALS — BP 166/71 | HR 81 | Temp 97.9°F | Resp 18 | Ht 60.25 in | Wt 113.8 lb

## 2021-09-16 DIAGNOSIS — I890 Lymphedema, not elsewhere classified: Secondary | ICD-10-CM | POA: Insufficient documentation

## 2021-09-16 DIAGNOSIS — I10 Essential (primary) hypertension: Secondary | ICD-10-CM | POA: Insufficient documentation

## 2021-09-16 DIAGNOSIS — C787 Secondary malignant neoplasm of liver and intrahepatic bile duct: Secondary | ICD-10-CM | POA: Insufficient documentation

## 2021-09-16 DIAGNOSIS — C50912 Malignant neoplasm of unspecified site of left female breast: Secondary | ICD-10-CM

## 2021-09-16 DIAGNOSIS — C7951 Secondary malignant neoplasm of bone: Secondary | ICD-10-CM | POA: Insufficient documentation

## 2021-09-16 DIAGNOSIS — Z171 Estrogen receptor negative status [ER-]: Secondary | ICD-10-CM

## 2021-09-16 DIAGNOSIS — Z9221 Personal history of antineoplastic chemotherapy: Secondary | ICD-10-CM | POA: Insufficient documentation

## 2021-09-16 DIAGNOSIS — Z5189 Encounter for other specified aftercare: Secondary | ICD-10-CM | POA: Insufficient documentation

## 2021-09-16 DIAGNOSIS — Z853 Personal history of malignant neoplasm of breast: Secondary | ICD-10-CM | POA: Insufficient documentation

## 2021-09-16 DIAGNOSIS — C50412 Malignant neoplasm of upper-outer quadrant of left female breast: Secondary | ICD-10-CM | POA: Insufficient documentation

## 2021-09-16 DIAGNOSIS — Z85038 Personal history of other malignant neoplasm of large intestine: Secondary | ICD-10-CM | POA: Insufficient documentation

## 2021-09-16 DIAGNOSIS — Z5112 Encounter for antineoplastic immunotherapy: Secondary | ICD-10-CM | POA: Insufficient documentation

## 2021-09-16 DIAGNOSIS — Z5111 Encounter for antineoplastic chemotherapy: Secondary | ICD-10-CM | POA: Insufficient documentation

## 2021-09-16 DIAGNOSIS — C7989 Secondary malignant neoplasm of other specified sites: Secondary | ICD-10-CM

## 2021-09-16 DIAGNOSIS — C779 Secondary and unspecified malignant neoplasm of lymph node, unspecified: Secondary | ICD-10-CM | POA: Insufficient documentation

## 2021-09-16 DIAGNOSIS — Z9012 Acquired absence of left breast and nipple: Secondary | ICD-10-CM | POA: Insufficient documentation

## 2021-09-17 ENCOUNTER — Inpatient Hospital Stay: Payer: Medicare Other

## 2021-09-17 VITALS — BP 150/63 | HR 71 | Temp 97.9°F | Resp 18 | Ht 60.25 in | Wt 114.5 lb

## 2021-09-17 DIAGNOSIS — Z5189 Encounter for other specified aftercare: Secondary | ICD-10-CM | POA: Diagnosis not present

## 2021-09-17 DIAGNOSIS — C50412 Malignant neoplasm of upper-outer quadrant of left female breast: Secondary | ICD-10-CM | POA: Diagnosis present

## 2021-09-17 DIAGNOSIS — Z9012 Acquired absence of left breast and nipple: Secondary | ICD-10-CM | POA: Diagnosis not present

## 2021-09-17 DIAGNOSIS — C787 Secondary malignant neoplasm of liver and intrahepatic bile duct: Secondary | ICD-10-CM | POA: Diagnosis not present

## 2021-09-17 DIAGNOSIS — I10 Essential (primary) hypertension: Secondary | ICD-10-CM | POA: Diagnosis not present

## 2021-09-17 DIAGNOSIS — C779 Secondary and unspecified malignant neoplasm of lymph node, unspecified: Secondary | ICD-10-CM | POA: Diagnosis not present

## 2021-09-17 DIAGNOSIS — I890 Lymphedema, not elsewhere classified: Secondary | ICD-10-CM | POA: Diagnosis not present

## 2021-09-17 DIAGNOSIS — C7951 Secondary malignant neoplasm of bone: Secondary | ICD-10-CM | POA: Diagnosis not present

## 2021-09-17 DIAGNOSIS — Z171 Estrogen receptor negative status [ER-]: Secondary | ICD-10-CM

## 2021-09-17 DIAGNOSIS — Z5111 Encounter for antineoplastic chemotherapy: Secondary | ICD-10-CM | POA: Diagnosis present

## 2021-09-17 DIAGNOSIS — Z5112 Encounter for antineoplastic immunotherapy: Secondary | ICD-10-CM | POA: Diagnosis present

## 2021-09-17 DIAGNOSIS — Z85038 Personal history of other malignant neoplasm of large intestine: Secondary | ICD-10-CM | POA: Diagnosis not present

## 2021-09-17 DIAGNOSIS — Z9221 Personal history of antineoplastic chemotherapy: Secondary | ICD-10-CM | POA: Diagnosis not present

## 2021-09-17 DIAGNOSIS — Z853 Personal history of malignant neoplasm of breast: Secondary | ICD-10-CM | POA: Diagnosis not present

## 2021-09-17 MED ORDER — SODIUM CHLORIDE 0.9 % IV SOLN
420.0000 mg | Freq: Once | INTRAVENOUS | Status: AC
  Administered 2021-09-17: 420 mg via INTRAVENOUS
  Filled 2021-09-17: qty 14

## 2021-09-17 MED ORDER — SODIUM CHLORIDE 0.9 % IV SOLN
10.0000 mg | Freq: Once | INTRAVENOUS | Status: AC
  Administered 2021-09-17: 10 mg via INTRAVENOUS
  Filled 2021-09-17: qty 10

## 2021-09-17 MED ORDER — TRASTUZUMAB-DKST CHEMO 150 MG IV SOLR
300.0000 mg | Freq: Once | INTRAVENOUS | Status: AC
  Administered 2021-09-17: 300 mg via INTRAVENOUS
  Filled 2021-09-17: qty 14.29

## 2021-09-17 MED ORDER — SODIUM CHLORIDE 0.9 % IV SOLN: Freq: Once | INTRAVENOUS | Status: AC

## 2021-09-17 MED ORDER — DIPHENHYDRAMINE HCL 25 MG PO CAPS
25.0000 mg | ORAL_CAPSULE | Freq: Once | ORAL | Status: AC
  Administered 2021-09-17: 25 mg via ORAL
  Filled 2021-09-17: qty 1

## 2021-09-17 MED ORDER — HEPARIN SOD (PORK) LOCK FLUSH 100 UNIT/ML IV SOLN
500.0000 [IU] | Freq: Once | INTRAVENOUS | Status: AC | PRN
  Administered 2021-09-17: 500 [IU]

## 2021-09-17 MED ORDER — SODIUM CHLORIDE 0.9% FLUSH
10.0000 mL | INTRAVENOUS | Status: DC | PRN
  Administered 2021-09-17: 10 mL

## 2021-09-17 MED ORDER — ACETAMINOPHEN 325 MG PO TABS
650.0000 mg | ORAL_TABLET | Freq: Once | ORAL | Status: AC
  Administered 2021-09-17: 650 mg via ORAL
  Filled 2021-09-17: qty 2

## 2021-09-17 MED ORDER — ZOLEDRONIC ACID 4 MG/5ML IV CONC
3.0000 mg | Freq: Once | INTRAVENOUS | Status: AC
  Administered 2021-09-17: 3 mg via INTRAVENOUS
  Filled 2021-09-17: qty 3.75

## 2021-09-17 MED ORDER — SODIUM CHLORIDE 0.9 % IV SOLN
60.0000 mg/m2 | Freq: Once | INTRAVENOUS | Status: AC
  Administered 2021-09-17: 90 mg via INTRAVENOUS
  Filled 2021-09-17: qty 9

## 2021-09-17 NOTE — Patient Instructions (Signed)
[facility]  Discharge Instructions: ?Thank you for choosing [facility] to provide your oncology and hematology care.  ?If you have a lab appointment with the [facility], please go directly to the [facility] and check in at the registration area. ?  ?Wear comfortable clothing and clothing appropriate for easy access to any Portacath or PICC line.  ? ?We strive to give you quality time with your provider. You may need to reschedule your appointment if you arrive late (15 or more minutes).  Arriving late affects you and other patients whose appointments are after yours.  Also, if you miss three or more appointments without notifying the office, you may be dismissed from the clinic at the provider?s discretion.    ?  ?For prescription refill requests, have your pharmacy contact our office and allow 72 hours for refills to be completed.   ? ?Today you received the following chemotherapy and/or immunotherapy agents Pertuzumab, Trastuzumab,DocetaxelZoledronic Acid Injection (Cancer) ?What is this medication? ?ZOLEDRONIC ACID (ZOE le dron ik AS id) treats high calcium levels in the blood caused by cancer. It may also be used with chemotherapy to treat weakened bones caused by cancer. It works by slowing down the release of calcium from bones. This lowers calcium levels in your blood. It also makes your bones stronger and less likely to break (fracture). It belongs to a group of medications called bisphosphonates. ?This medicine may be used for other purposes; ask your health care provider or pharmacist if you have questions. ?COMMON BRAND NAME(S): Zometa, Zometa Powder ?What should I tell my care team before I take this medication? ?They need to know if you have any of these conditions: ?Dehydration ?Dental disease ?Kidney disease ?Liver disease ?Low levels of calcium in the blood ?Lung or breathing disease, such as asthma ?Receiving steroids, such as dexamethasone or  prednisone ?An unusual or allergic reaction to zoledronic acid, other medications, foods, dyes, or preservatives ?Pregnant or trying to get pregnant ?Breast-feeding ?How should I use this medication? ?This medication is injected into a vein. It is given by your care team in a hospital or clinic setting. ?Talk to your care team about the use of this medication in children. Special care may be needed. ?Overdosage: If you think you have taken too much of this medicine contact a poison control center or emergency room at once. ?NOTE: This medicine is only for you. Do not share this medicine with others. ?What if I miss a dose? ?Keep appointments for follow-up doses. It is important not to miss your dose. Call your care team if you are unable to keep an appointment. ?What may interact with this medication? ?Certain antibiotics given by injection ?Diuretics, such as bumetanide, furosemide ?NSAIDs, medications for pain and inflammation, such as ibuprofen or naproxen ?Teriparatide ?Thalidomide ?This list may not describe all possible interactions. Give your health care provider a list of all the medicines, herbs, non-prescription drugs, or dietary supplements you use. Also tell them if you smoke, drink alcohol, or use illegal drugs. Some items may interact with your medicine. ?What should I watch for while using this medication? ?Visit your care team for regular checks on your progress. It may be some time before you see the benefit from this medication. ?Some people who take this medication have severe bone, joint, or muscle pain. This medication may also increase your risk for jaw problems or a broken thigh bone. Tell your care team right away if you have severe  pain in your jaw, bones, joints, or muscles. Tell you care team if you have any pain that does not go away or that gets worse. ?Tell your dentist and dental surgeon that you are taking this medication. You should not have major dental surgery while on this  medication. See your dentist to have a dental exam and fix any dental problems before starting this medication. Take good care of your teeth while on this medication. Make sure you see your dentist for regular follow-up appointments. ?You should make sure you get enough calcium and vitamin D while you are taking this medication. Discuss the foods you eat and the vitamins you take with your care team. ?Check with your care team if you have severe diarrhea, nausea, and vomiting, or if you sweat a lot. The loss of too much body fluid may make it dangerous for you to take this medication. ?You may need bloodwork while taking this medication. ?Talk to your care team if you wish to become pregnant or think you might be pregnant. This medication can cause serious birth defects. ?What side effects may I notice from receiving this medication? ?Side effects that you should report to your care team as soon as possible: ?Allergic reactions--skin rash, itching, hives, swelling of the face, lips, tongue, or throat ?Kidney injury--decrease in the amount of urine, swelling of the ankles, hands, or feet ?Low calcium level--muscle pain or cramps, confusion, tingling, or numbness in the hands or feet ?Osteonecrosis of the jaw--pain, swelling, or redness in the mouth, numbness of the jaw, poor healing after dental work, unusual discharge from the mouth, visible bones in the mouth ?Severe bone, joint, or muscle pain ?Side effects that usually do not require medical attention (report to your care team if they continue or are bothersome): ?Constipation ?Fatigue ?Fever ?Loss of appetite ?Nausea ?Stomach pain ?This list may not describe all possible side effects. Call your doctor for medical advice about side effects. You may report side effects to FDA at 1-800-FDA-1088. ?Where should I keep my medication? ?This medication is given in a hospital or clinic. It will not be stored at home. ?NOTE: This sheet is a summary. It may not cover all  possible information. If you have questions about this medicine, talk to your doctor, pharmacist, or health care provider. ?? 2023 Elsevier/Gold Standard (2021-06-19 00:00:00) ?    ?  ?To help prevent nausea and vomiting after your treatment, we encourage you to take your nausea medication as directed. ? ?BELOW ARE SYMPTOMS THAT SHOULD BE REPORTED IMMEDIATELY: ?*FEVER GREATER THAN 100.4 F (38 ?C) OR HIGHER ?*CHILLS OR SWEATING ?*NAUSEA AND VOMITING THAT IS NOT CONTROLLED WITH YOUR NAUSEA MEDICATION ?*UNUSUAL SHORTNESS OF BREATH ?*UNUSUAL BRUISING OR BLEEDING ?*URINARY PROBLEMS (pain or burning when urinating, or frequent urination) ?*BOWEL PROBLEMS (unusual diarrhea, constipation, pain near the anus) ?TENDERNESS IN MOUTH AND THROAT WITH OR WITHOUT PRESENCE OF ULCERS (sore throat, sores in mouth, or a toothache) ?UNUSUAL RASH, SWELLING OR PAIN  ?UNUSUAL VAGINAL DISCHARGE OR ITCHING  ? ?Items with * indicate a potential emergency and should be followed up as soon as possible or go to the Emergency Department if any problems should occur. ? ?Please show the CHEMOTHERAPY ALERT CARD or IMMUNOTHERAPY ALERT CARD at check-in to the Emergency Department and triage nurse. ? ?Should you have questions after your visit or need to cancel or reschedule your appointment, please contact [facility]  Dept: [phone]  and follow the prompts.  Office hours are  8:00 a.m. to 4:30 p.m. Monday - Friday. Please note that voicemails left after 4:00 p.m. may not be returned until the following business day.  We are closed weekends and major holidays. You have access to a nurse at all times for urgent questions. Please call the main number to the clinic Dept: [phone] and follow the prompts. ? ?For any non-urgent questions, you may also contact your provider using MyChart. We now offer e-Visits for anyone 24 and older to request care online for non-urgent symptoms. For details visit [email or web address]. ?   ?Also download the MyChart app! Go to the app store, search "MyChart", open the app, select [facility], and log in with your MyChart username and password. ? ?Due to Covid, a mask is required upon entering the hospi

## 2021-09-17 NOTE — Progress Notes (Signed)
1505: PT STABLE AT TIME OF DISCHARGE  

## 2021-09-17 NOTE — Progress Notes (Signed)
Patient teaching done re: Calcium and Vitamin D supplements. She has not picked up from pharmacy yet. Instructions written for her to start Calcium 600 mg po twice daily with Vitamin D. Will add to med list on next visit when she starts them. ?

## 2021-09-18 ENCOUNTER — Inpatient Hospital Stay: Payer: Medicare Other

## 2021-09-18 VITALS — BP 178/78 | HR 87 | Temp 98.2°F | Resp 18 | Ht 60.25 in | Wt 115.0 lb

## 2021-09-18 DIAGNOSIS — Z5112 Encounter for antineoplastic immunotherapy: Secondary | ICD-10-CM | POA: Diagnosis not present

## 2021-09-18 DIAGNOSIS — C7951 Secondary malignant neoplasm of bone: Secondary | ICD-10-CM

## 2021-09-18 DIAGNOSIS — Z171 Estrogen receptor negative status [ER-]: Secondary | ICD-10-CM

## 2021-09-18 DIAGNOSIS — C787 Secondary malignant neoplasm of liver and intrahepatic bile duct: Secondary | ICD-10-CM

## 2021-09-18 MED ORDER — PEGFILGRASTIM-CBQV 6 MG/0.6ML ~~LOC~~ SOSY
6.0000 mg | PREFILLED_SYRINGE | Freq: Once | SUBCUTANEOUS | Status: AC
  Administered 2021-09-18: 6 mg via SUBCUTANEOUS
  Filled 2021-09-18: qty 0.6

## 2021-09-18 NOTE — Patient Instructions (Signed)

## 2021-10-01 ENCOUNTER — Encounter: Payer: Self-pay | Admitting: Oncology

## 2021-10-07 ENCOUNTER — Encounter: Payer: Self-pay | Admitting: Oncology

## 2021-10-07 ENCOUNTER — Inpatient Hospital Stay: Payer: Medicare Other

## 2021-10-07 ENCOUNTER — Inpatient Hospital Stay (INDEPENDENT_AMBULATORY_CARE_PROVIDER_SITE_OTHER): Payer: Medicare Other | Admitting: Oncology

## 2021-10-07 VITALS — BP 175/70 | HR 77 | Temp 97.7°F | Resp 16 | Ht 60.25 in | Wt 111.5 lb

## 2021-10-07 DIAGNOSIS — C7951 Secondary malignant neoplasm of bone: Secondary | ICD-10-CM | POA: Diagnosis not present

## 2021-10-07 DIAGNOSIS — C787 Secondary malignant neoplasm of liver and intrahepatic bile duct: Secondary | ICD-10-CM | POA: Diagnosis not present

## 2021-10-07 DIAGNOSIS — C7989 Secondary malignant neoplasm of other specified sites: Secondary | ICD-10-CM

## 2021-10-07 DIAGNOSIS — C50912 Malignant neoplasm of unspecified site of left female breast: Secondary | ICD-10-CM | POA: Diagnosis not present

## 2021-10-07 DIAGNOSIS — C50412 Malignant neoplasm of upper-outer quadrant of left female breast: Secondary | ICD-10-CM

## 2021-10-07 DIAGNOSIS — Z171 Estrogen receptor negative status [ER-]: Secondary | ICD-10-CM

## 2021-10-07 LAB — COMPREHENSIVE METABOLIC PANEL
Albumin: 3.9 (ref 3.5–5.0)
Calcium: 8.8 (ref 8.7–10.7)

## 2021-10-07 LAB — HEPATIC FUNCTION PANEL
ALT: 21 U/L (ref 7–35)
AST: 39 — AB (ref 13–35)
Alkaline Phosphatase: 70 (ref 25–125)
Bilirubin, Total: 0.6

## 2021-10-07 LAB — CBC AND DIFFERENTIAL
HCT: 36 (ref 36–46)
Hemoglobin: 12 (ref 12.0–16.0)
Neutrophils Absolute: 3.84
Platelets: 180 10*3/uL (ref 150–400)
WBC: 6.5

## 2021-10-07 LAB — BASIC METABOLIC PANEL
BUN: 15 (ref 4–21)
CO2: 25 — AB (ref 13–22)
Chloride: 106 (ref 99–108)
Creatinine: 0.6 (ref 0.5–1.1)
Glucose: 100
Potassium: 4.2 mEq/L (ref 3.5–5.1)
Sodium: 138 (ref 137–147)

## 2021-10-07 LAB — MAGNESIUM: Magnesium: 2.1

## 2021-10-07 LAB — CBC: RBC: 3.76 — AB (ref 3.87–5.11)

## 2021-10-07 MED FILL — Pertuzumab Soln for IV Infusion 420 MG/14ML (30 MG/ML): INTRAVENOUS | Qty: 14 | Status: AC

## 2021-10-07 MED FILL — Trastuzumab-dkst For IV Soln 150 MG: INTRAVENOUS | Qty: 14.3 | Status: AC

## 2021-10-07 MED FILL — Dexamethasone Sodium Phosphate Inj 100 MG/10ML: INTRAMUSCULAR | Qty: 1 | Status: AC

## 2021-10-07 MED FILL — Docetaxel Soln for IV Infusion 160 MG/16ML: INTRAVENOUS | Qty: 9 | Status: AC

## 2021-10-07 NOTE — Progress Notes (Unsigned)
[facility]  [address] [place],  [place]  [address] [phone]  Clinic Day: 09/16/21  Referring physician: [name], MD  ASSESSMENT & PLAN:   Stage IIB (T2c N1 M0) HER2 receptor positive left breast cancer, diagnosed in June 2020. This was ER/PR negative. This was treated with neoadjuvant Kadcyla/Perjeta and left mastectomy, with a complete response in breast and nodes. She had only received two doses of adjuvant Herceptin before being discontinued in December 2020.  History of stage II colon cancer, diagnosed in May 2007. This was treated with surgical resection and adjuvant chemotherapy with Xeloda. Colonoscopy from 2013 revealed 2 polyps which were resected and chronic diverticulosis.  Liver metastases discovered 04/17/21.  Numerous heterogeneously enhancing, internally necrotic lesions in the right hepatic lobe, consistent with metastatic disease. Largest lesion measures up to 6.7 cm and there are at least 6 lesions. We now have biopsy proven HER2 positive recurrent breast cancer with ER/PR negative. She is receiving THP and is tolerating it without significant difficulty. She had improvement in her liver function tests after 1 cycle, and they are now normal.  CT scan now shows excellent response with significant decrease in the size of her liver lesions.  Bone metastases discovered 04/17/21. Enhancing osseous lesions are present in the L2 vertebral body, both iliac bones, and in the sacrum, highly concerning for osseous metastases. She received her 1st dose of zoledronic acid on January 16th.  I once again today explained to her and her son the reasoning for this medicine.  Her widespread bone metastases show an interval increase in sclerosis of previously lytic lesions consistent with treatment response.  Left supraclavicular lymph node measuring 2-3 cm. Also involvement of subpectoral node and chest wall skin. This was rapidly progressive despite  receiving  trastuzumab.  We have added in Perjeta and Taxotere to her current regimen and this has been very effective.  The CT scan also shows good response of her adenopathy with resolution of the previous bulky left axillary and subpectoral adenopathy.  Lymphedema of the left upper extremity.  It is very mild at this time  Hypercalcemia, now resolved.  Numbness and paresthesias of the left hand which I feel is related to her spine metastasis involvement at T5.  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  I cannot rule out carpal tunnel syndrome.  We referred her to physical therapy and hand specialist but she feels she has had very little benefit and so we will stop that.   She has been on THP since mid February and is clearly responding well by CT scan.  I reviewed the results of her scan in detail with her and her son and answered their questions.  I explained she will need to continue this therapy.  They have asked about her magnesium level and so I will add that to the labs next time.  She is still very anxious and needs reassurance.  Her main concern is the disability that has developed of her left upper extremity.  That refer her to we will also consult a neurologist for further evaluation of the etiology of this.  She has tolerated this regimen without much difficulty and we can clearly see an improvement in the skin lesions of the left mastectomy and the left axillary adenopathy, and improvement in her liver function tests.  We will see her back in 3 weeks with CBC, CMP  and CT scans of chest, abdomen and pelvis prior to her next cycle of therapy.  She and her son understand and agree with this plan of care. I have answered their questions and she knows to call with any concerns.   I provided 30 minutes of face-to-face time during this this encounter and > 50% was spent counseling as documented under my assessment and plan.    [name], MD [facility] [address] [place] [address] Dept: [phone] Dept Fax: [phone]   CHIEF COMPLAINT:  CC: Stage IIB HER2 receptor positive left breast cancer with evidence of metastatic disease  Current Treatment:  Taxotere/Herceptin/Perjeta   HISTORY OF PRESENT ILLNESS:  Jasmin Lewis is a 88 y.o. female who presents for a transfer of care for there continued evaluation and management of stage IIB left breast cancer. She was originally diagnosed in June 2020 when she was able to palpate a mass of the left breast. Mammogram and ultrasound from May 2020 revealed a 2.9 cm irregular mass in the upper outer left breast as well as two adjacent abnormal left axillary lymph nodes. Biopsy was obtained on June 3rd and revealed invasive ductal carcinoma and DCIS with lymphovascular invasion. Left axillary lymph node was positive for invasive ductal carcinoma. HER2 was positive 3+. Estrogen and progesterone receptors were both negative at 0%, and Ki67 was 70%. Breast MRI from July revealed a 3.3 cm mass of the lateral portion of the left breast as well as significant non mass enhancement surrounding this mass and extending anteriorly into the nipple base, with largest diameter in the anterior to posterior axis, measuring 7.2 centimeters. Three enlarged left axillary lymph nodes. Right breast was negative.  PET imaging from July revealed no evidence of additional metastatic involvement. She received neoadjuvant Kadcyla/Perjeta for 6 cycles. Repeat breast MRI from October revealed complete resolution of the enhancing mass and associated non mass enhancement within the left breast. Previously identified left axillary lymphadenopathy was no longer identified. She underwent left mastectomy and targeted lymph node dissection in November 2020 and final pathology from this procedure confirmed no evidence of malignancy, for a  complete response. Three sentinel lymph nodes were negative for malignancy (0/3). She continued maintenance Herceptin injections for two more doses, but this was discontinued in December 2020 as she refused further ECHO scans.   Of note, she also has a history of stage II colon cancer, diagnosed in May 2007. Preop CEA was 5.2. She underwent surgical resection and surgical pathology revealed a 5.9 x 3.8 x 1.2 cm moderate to well-differentiated adenocarcinoma penetrating the muscular wall. Forty lymph nodes negative. No vascular or lymphatic invasion. Multiple diverticula with microabscess formation and inflammation. Carcinoma present in at least 1 diverticulum. She received adjuvant chemotherapy with Xeloda for 9 months. Colonoscopy from August 2013 revealed 2 polyps which were resected and chronic diverticulosis.  [place] states that she presented to [facility] on December 9th due to constipation and back pain with spasms. CT abdomen/pelvis from December 9th revealed new lytic lesion involving the L2 vertebral body with minimal cortical breakthrough superiorly, but with preservation of vertebral body height, concerning for metastatic disease. There are several indeterminate hepatic lesions. In addition to the lesions, there is an additional subtle 1.5 cm hypodensity in the hepatic dome. MRI abdomen from January 3rd revealed numerous heterogeneously enhancing, internally necrotic lesions in the right hepatic lobe, highly concerning for metastatic disease. Largest lesion measures up to 6.7  cm in hepatic segment VI. Enhancing osseous lesions in the L2 vertebral body, both iliac bones, and in the sacrum, highly concerning for osseous metastases. She has chronic lymphedema of the left upper extremity since November 2022 after an accident, for which she wears a compression sleeve. However, her skin is reacting to the plastic cuff of the sleeve, even though she has tried turning it inside out. She is limited in her  mobility of the right upper extremity due to prior incident of dislocated shoulder. I met with her on January 6th and reviewed the imaging. I recommended further evaluation before finalizing a treatment plan. We scheduled a PET scan, MRI brain and spine, and ECHO. We also referred her to Dr. Lilia Pro for node biopsy and port placement. When he saw her, he found a nodule in the upper left chest wall which was suspicious and biopsied this rather than the left supraclavicular node. She underwent biopsy of a chest wall nodule on January 12th with Dr. Lilia Pro. Pathology confirmed carcinoma, 5 mm nodule, involving dermis, subcutaneous tissue and lymphovascular space, compatible with metastatic breast carcinoma. CK7 positive, estrogen staining is mostly negative but there is some faint positivity. HER2 was positive 3+. Estrogen and progesterone receptors were negative. Ki67 was 70%.   INTERVAL HISTORY:  I have reviewed her chart and materials related to her cancer extensively and collaborated history with the patient. Summary of oncologic history is as follows: Oncology History Overview Note  Cancer Staging Malignant neoplasm of upper-outer quadrant of left breast in female, estrogen receptor negative ([place]) Staging form: Breast, AJCC 8th Edition - Clinical stage from 10/11/2018: Stage IIB (cT2, cN1, cM0, G3, ER-, PR-, HER2+) - Signed by Truitt Merle, MD on 11/02/2018 - Clinical: No stage assigned - Unsigned    History of colon cancer, stage II  09/2005 Initial Diagnosis   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 Findings were a 5.9 x 3.8 x 1.2 cm moderate to well-differentiated adenocarcinoma penetrating the muscular wall. Forty lymph nodes negative. No vascular or lymphatic invasion. Multiple diverticula with microabscess formation and inflammation. Carcinoma present in at least 1 diverticulum. Preop CEA 5.2 with lab  normal range 0 to 2.5. Preop CT scan with no obvious additional pathology.    2007 -  Chemotherapy   She received oral Xeloda chemotherapy as an adjuvant. She declined treatment on a clinical trial.    11/12/2011 Initial Diagnosis   [name], stage II   12/09/2011 Procedure   Followup colonoscopy done on 12/09/2011. She was found to have 2 polyps which were removed. Chronic diverticulosis.     Malignant neoplasm of upper-outer quadrant of left breast in female, estrogen receptor negative ([place])  09/26/2018 Mammogram   Mammogram/US of left breast 09/26/18 IMPRESSION:  1. 2.9cm irregular mass in the upper outer left breast corresponds to the palpable abnormality. This is highly suspicious for breast carcinoma.  2. Two adjacent abnormal left axillary  Lymph nodes suspicious for metastatic adenopathy. There is a third borderline abnormal left axillary LN with a cortex thickened to 19m.  3. Benign right breast cyst. No evidence of right breast malignancy.    10/11/2018 Cancer Staging   Staging form: Breast, AJCC 8th Edition - Clinical stage from 10/11/2018: Stage IIB (cT2, cN1, cM0, G3, ER-, PR-, HER2+) - Signed by [name] MD on 11/02/2018   10/11/2018 Initial Biopsy   Diagnosis 10/11/18 1. Breast, left, needle  core biopsy, upper outer left 2 o'clock - INVASIVE DUCTAL CARCINOMA. - DUCTAL CARCINOMA IN SITU. - LYMPHOVASCULAR INVASION IS IDENTIFIED. - SEE COMMENT. 2. Lymph node, needle/core biopsy, left axilla - INVASIVE DUCTAL CARCINOMA. - SEE COMMENT.   10/11/2018 Receptors her2   The tumor cells are POSITIVE for Her2 (3+). Estrogen Receptor: 0%, NEGATIVE Progesterone Receptor: 0%, NEGATIVE Proliferation Marker Ki67: 70%   11/02/2018 Initial Diagnosis   Malignant neoplasm of upper-outer quadrant of left breast in female, estrogen receptor negative ([place])   11/15/2018 Breast MRI   MRI breast 11/15/18  IMPRESSION: 1. 3.3 centimeter mass in the LATERAL portion of the LEFT breast consistent  with known malignancy. 2. There is significant non mass enhancement surrounding this mass and extending anteriorly into the nipple base, with largest diameter in the anterior to posterior axis, measuring 7.2 centimeters. 3. If the patient would consider breast conservation, additional MR guided core biopsies are recommended. Consider biopsy of the inferior and anterior extent of the non mass enhancement to document extent of disease. 4. Three enlarged LEFT axillary lymph nodes. 5. RIGHT breast is negative.   11/15/2018 PET scan   PET 11/15/18 IMPRESSION: Hypermetabolic left breast lesion compatible with known primary. Hypermetabolic left axillary lymph nodes are consistent with metastatic disease.   No evidence for additional hypermetabolic metastatic involvement in the neck, chest, abdomen, or pelvis.   11/17/2018 - 03/01/2019 Chemotherapy   Neo-adjuvant Kadcyla and perjeta q3weeks for 6 cycles starting 11/17/18. Stopped before surgery    11/29/2018 Pathology Results   Diagnosis 1. Breast, left, needle core biopsy, inferior anterior (cylinder clip) - INVASIVE DUCTAL CARCINOMA. - DUCTAL CARCINOMA IN SITU. - LYMPHOVASCULAR INVASION IS IDENTIFIED. - SEE COMMENT. 2. Breast, left, needle core biopsy, central posterior (barbell clip) - INVASIVE DUCTAL CARCINOMA. - LYMPHOVASCULAR INVASION IS IDENTIFIED.   02/12/2019 Breast MRI   IMPRESSION: 1. Complete resolution of previously identified enhancing mass and associated non mass enhancement within the left breast. This is consistent with excellent response to chemotherapy. No residual or suspicious findings are identified. 2. No MRI evidence of malignancy on the right. 3. Previously identified left axillary lymphadenopathy not definitively seen on today's study. However, this may be due to decreased field-of-view compared to prior study.     03/14/2019 Cancer Staging   Staging form: Breast, AJCC 8th Edition - Pathologic stage from  03/14/2019: pT0, pN0, cM0, GX, ER: Unknown, PR: Unknown, HER2: Not Assessed - Signed by Truitt Merle, MD on 03/28/2019   03/14/2019 Surgery   LEFT MASTECTOMY WITH TARGETED LYMPH NODE DISSECTION and Left Axillary Sentinel Lymph  Node Biopsy by Dr [name] 03/14/19    03/14/2019 Pathology Results   FINAL MICROSCOPIC DIAGNOSIS:   A. LYMPH NODE, LEFT, SENTINEL, BIOPSY:  - There is no evidence of carcinoma in 1 of 1 lymph node (0/1).   B. BREAST, LEFT, MASTECTOMY:  - Benign breast parenchyma with treatment-related changes.  - There is no evidence of malignancy.  - See oncology table below.   C. LYMPH NODE, LEFT #1, SENTINEL, BIOPSY:  - There is no evidence of carcinoma in 1 of 1 lymph node (0/1).   D. LYMPH NODE, LEFT #2, SENTINEL, BIOPSY:  - There is no evidence of carcinoma in 1 of 1 lymph node (0/1).     04/04/2019 - 04/25/2019 Chemotherapy   Maintenance Herceptin injections every 3 weeks starting 04/03/19 to complete 1 year of treatment that was started in 11/2018. Stopped after 2nd dose as she will not be repeating Echos.  06/03/2021 - 06/24/2021 Chemotherapy   Patient is on Treatment Plan : BREAST Trastuzumab q21d X 11 Cycles     06/25/2021 -  Chemotherapy   Patient is on Treatment Plan : BREAST Docetaxel + Trastuzumab + Pertuzumab (THP) q21d     Malignant neoplasm metastatic to liver ([place])  04/17/2021 Imaging   CT ABDOMEN AND PELVIS WITH CONTRAST: -New lytic lesion involving the L2 vertebral body (6:72, 2:26) with minimal cortical breakthrough superiorly, but with preservation of vertebral body height, concerning for metastatic disease.  -There are several indeterminate hepatic lesions as described above. In addition to the lesions described above, there is an additional subtle 1.5 cm hypodensity in the hepatic dome (2:7). Given clinical history and presence of a new L2 lesion, leading differential consideration is metastatic disease. Recommend further evaluation with MRI of the abdomen with  and without contrast.   05/12/2021 Imaging   MRI ABDOMEN WITH AND WITHOUT CONTRAST: Numerous heterogeneously enhancing, internally necrotic lesions in the right hepatic lobe, highly concerning for metastatic disease. Largest lesion measures up to 6.7 cm in hepatic segment VI.   Enhancing osseous lesions in the L2 vertebral body, both iliac bones, and in the sacrum, highly concerning for osseous metastases.   05/15/2021 Initial Diagnosis   Liver metastases ([place])   05/27/2021 PET scan   1. Widespread recurrent/metastatic disease in this patient who is  status post bilateral mastectomy. Left chest wall recurrence with  left axillary/subpectoral, supraclavicular nodal metastasis.  2. Hepatic, left adrenal, and widespread osseous metastasis.  3. Incidental findings, including: Right nephrolithiasis. Tiny  hiatal hernia.    05/27/2021 Imaging   MRI LUMBAR AND THORACIC SPINE: Thoracic spine:  Osseous metastatic disease involving each level. The most dramatic deposits are at T4 and T6 where there is extraosseous/epidural tumor. No cord compression but there is foraminal impingement on the left at T6-7 and on the right at T3-4. Early dorsal epidural tumor at the level of T10 and T3.   Lumbar spine:  1. Widespread osseous metastatic disease with largest deposit  replacing the L2 body where there is a compression fracture with  mild height loss. Also at this level is early epidural tumor  extension likely affecting both L2-3 foramina.  2. 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    05/27/2021 Imaging   MRI HEAD WITH AND WITHOUT CONTRAST: Negative for metastatic disease to the brain or calvarium.   06/03/2021 - 06/24/2021 Chemotherapy   Patient is on Treatment Plan : BREAST Trastuzumab q21d X 11 Cycles     06/25/2021 -  Chemotherapy   Patient is on Treatment Plan : BREAST Docetaxel + Trastuzumab + Pertuzumab (THP) q21d     Malignant neoplasm metastatic to bone ([place])   04/17/2021 Imaging   CT ABDOMEN AND PELVIS WITH CONTRAST: -New lytic lesion involving the L2 vertebral body (6:72, 2:26) with minimal cortical breakthrough superiorly, but with preservation of vertebral body height, concerning for metastatic disease.  -There are several indeterminate hepatic lesions as described above. In addition to the lesions described above, there is an additional subtle 1.5 cm hypodensity in the hepatic dome (2:7). Given clinical history and presence of a new L2 lesion, leading differential consideration is metastatic disease. Recommend further evaluation with MRI of the abdomen with and without contrast.   05/12/2021 Imaging   MRI ABDOMEN WITH AND WITHOUT CONTRAST: Numerous heterogeneously enhancing, internally necrotic lesions in the right hepatic lobe, highly concerning for metastatic disease. Largest lesion measures up to 6.7 cm in hepatic  segment VI.   Enhancing osseous lesions in the L2 vertebral body, both iliac bones, and in the sacrum, highly concerning for osseous metastases.   05/15/2021 Initial Diagnosis   Bone metastases ([place])   05/27/2021 PET scan   1. Widespread recurrent/metastatic disease in this patient who is  status post bilateral mastectomy. Left chest wall recurrence with  left axillary/subpectoral, supraclavicular nodal metastasis.  2. Hepatic, left adrenal, and widespread osseous metastasis.  3. Incidental findings, including: Right nephrolithiasis. Tiny  hiatal hernia.    05/27/2021 Imaging   MRI LUMBAR AND THORACIC SPINE: Thoracic spine:  Osseous metastatic disease involving each level. The most dramatic deposits are at T4 and T6 where there is extraosseous/epidural tumor. No cord compression but there is foraminal impingement on the left at T6-7 and on the right at T3-4. Early dorsal epidural tumor at the level of T10 and T3.   Lumbar spine:  1. Widespread osseous metastatic disease with largest deposit  replacing the L2 body where there is a  compression fracture with  mild height loss. Also at this level is early epidural tumor  extension likely affecting both L2-3 foramina.  2. 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    05/27/2021 Imaging   MRI HEAD WITH AND WITHOUT CONTRAST: Negative for metastatic disease to the brain or calvarium.   06/03/2021 - 06/24/2021 Chemotherapy   Patient is on Treatment Plan : BREAST Trastuzumab q21d X 11 Cycles     06/25/2021 -  Chemotherapy   Patient is on Treatment Plan : BREAST Docetaxel + Trastuzumab + Pertuzumab (THP) q21d      Jasmin Lewis was initially treated with single agent trastuzumab for her widely metastatic cancer to skin, liver and bone.  She did not respond sufficiently and her regimen was changed to THP, that is Taxotere/Herceptin/Perjeta. She states that she tolerated without significant difficulty.  She had a dramatic difference in her skin lesions of the left mastectomy with just a few residual lesions.  Her left axillary adenopathy has resolved.  Her CT scan shows an excellent response, with resolution of the previous bulky left axillary and subpectoral lymphadenopathy.  Multiple liver lesions are decreased in size significantly and the osseous metastatic disease shows an increase in sclerosis of the previous lytic lesions, consistent with treatment response.  She does have a new wedge deformity of T6, age indeterminant as well as an old wedge deformity of L2 which is mildly increased.  She still has mild left upper extremity lymphedema but less than 1+ at this time.  However he has persistent disability of her hand and inability to use her fingers for fine motor skills.  I suspect this may be from brachial plexus damage from her metastatic cancer.  She feels the physical therapy was of limited if any benefit.  We will refer her to a neurologist to further evaluate the neuropathy to help [place] sort out whether this is from the spinal cord, brachial plexus, or  lymphedema.  It is possible this could be carpal tunnel syndrome.  White count is normal at 8.4, and hemoglobin is just mildly low at 11.7.  Chemistries are unremarkable and her liver function tests are nearly normal now with an SGOT of 45.  Her  appetite comes and goes, but she has no taste for food.  She complains of watering of the eyes.  She denies fever, chills or other signs of infection.  She denies nausea, vomiting, bowel issues, or abdominal pain.  She denies sore throat,  cough, dyspnea, or chest pain. She is accompanied by her son Jasmin Lewis.  She had an echocardiogram in January and so has agreed to have one again in July, as she does not feel it is necessary to do every 3 months according to her brother  HISTORY:   Allergies: No Known Allergies  Current Medications: Current Outpatient Medications  Medication Sig Dispense Refill  . dexamethasone (DECADRON) 4 MG tablet Take 5 tabs at the night before and 5 tab the morning of chemotherapy, every 3 weeks, by mouth (Patient not taking: Reported on 07/15/2021) 60 tablet 0  . fish oil-omega-3 fatty acids 1000 MG capsule Take 1 g by mouth daily.    . Gauze Pads & Dressings (TELFA NON-ADHERENT) 3"X4" PADS 1 each by Does not apply route in the morning and at bedtime. (Patient not taking: Reported on 07/15/2021) 100 each 0  . magnesium citrate SOLN Take 0.5 Bottles by mouth as needed for severe constipation.    . NON FORMULARY Magic Mouth Wash 3 parts Maalox 2 parts Benadryl 1 part Viscous Lidocaine Disp: 6oz Instructions: 81m orally swish and swallow every 3-4 hours  (Called into [facility] 07/06/21    . ondansetron (ZOFRAN) 4 MG tablet Take 1 tablet (4 mg total) by mouth every 8 (eight) hours as needed for nausea or vomiting. 20 tablet 2  . oxyCODONE (OXY IR/ROXICODONE) 5 MG immediate release tablet Take 1 tablet (5 mg total) by mouth every 4 (four) hours as needed. 100 tablet 0  . polyethylene glycol (MIRALAX / GLYCOLAX) 17 g packet Take 17 g  by mouth as needed. constipation    . senna (SENOKOT) 8.6 MG TABS tablet Take 1 tablet by mouth 2 (two) times daily.     No current facility-administered medications for this visit.    REVIEW OF SYSTEMS:  Review of Systems  Constitutional: Negative.  Negative for appetite change, chills, fatigue, fever and unexpected weight change.  HENT:  Negative.    Eyes: Negative.   Respiratory: Negative.  Negative for chest tightness, cough, hemoptysis, shortness of breath and wheezing.   Cardiovascular: Negative.  Negative for chest pain, leg swelling and palpitations.  Gastrointestinal: Negative.  Negative for abdominal distention, abdominal pain, blood in stool, constipation, diarrhea, nausea and vomiting.  Endocrine: Negative.   Genitourinary: Negative.  Negative for difficulty urinating, dysuria, frequency and hematuria.   Musculoskeletal: Negative.  Negative for arthralgias, back pain, flank pain, gait problem and myalgias.       Lymphedema of the left upper extremity  Skin: Negative.   Neurological:  Positive for numbness (neuropathy and paresthesias of the left hand). Negative for dizziness, extremity weakness, gait problem, headaches, light-headedness, seizures and speech difficulty.  Hematological: Negative.   Psychiatric/Behavioral: Negative.  Negative for depression and sleep disturbance. The patient is not nervous/anxious.     VITALS:  There were no vitals taken for this visit.  Wt Readings from Last 3 Encounters:  09/18/21 115 lb (52.2 kg)  09/17/21 114 lb 8 oz (51.9 kg)  09/16/21 113 lb 12.8 oz (51.6 kg)    There is no height or weight on file to calculate BMI.  Performance status (ECOG): 1 - Symptomatic but completely ambulatory  PHYSICAL EXAM:  Physical Exam Constitutional:      General: She is not in acute distress.    Appearance: Normal appearance. She is normal weight.  HENT:     Head: Normocephalic and atraumatic.  Eyes:     General: No scleral icterus.  Extraocular Movements: Extraocular movements intact.     Conjunctiva/sclera: Conjunctivae normal.     Pupils: Pupils are equal, round, and reactive to light.  Cardiovascular:     Rate and Rhythm: Normal rate and regular rhythm.     Pulses: Normal pulses.     Heart sounds: Normal heart sounds. No murmur heard.   No friction rub. No gallop.  Pulmonary:     Effort: Pulmonary effort is normal. No respiratory distress.     Breath sounds: Normal breath sounds.  Chest:  Breasts:    Right: Normal.     Left: Absent.     Comments: She has a few tiny barely palpable residual lesions of the skin and the axillary mass has resolved. Abdominal:     General: Bowel sounds are normal. There is no distension.     Palpations: Abdomen is soft. There is no hepatomegaly, splenomegaly or mass.     Tenderness: There is no abdominal tenderness.  Musculoskeletal:        General: Normal range of motion.     Cervical back: Normal range of motion and neck supple.     Right lower leg: No edema.     Left lower leg: No edema.  Lymphadenopathy:     Comments: Left supraclavicular adenopathy is resolved  Skin:    General: Skin is warm and dry.  Neurological:     Mental Status: She is alert and oriented to person, place, and time. Mental status is at baseline.     Motor: Weakness present.     Comments: She has weakness and decreased coordination of her left hand associated with paresthesias  Psychiatric:        Mood and Affect: Mood normal.        Behavior: Behavior normal.        Thought Content: Thought content normal.        Judgment: Judgment normal.    LABS:      Latest Ref Rng & Units 09/14/2021   12:00 AM 08/26/2021   12:00 AM 08/05/2021   12:00 AM  CBC  WBC  8.4      6.4      6.0       Hemoglobin 12.0 - 16.0 11.7      11.7      11.2       Hematocrit 36 - 46 36      36      34       Platelets 150 - 400 K/uL 181      189      208          This result is from an external source.       Latest Ref  Rng & Units 09/14/2021   12:00 AM 08/26/2021   12:00 AM 08/05/2021   12:00 AM  CMP  BUN 4 - 21 14      18      15        Creatinine 0.5 - 1.1 0.5      0.6      0.6       Sodium 137 - 147 138      140      135       Potassium 3.5 - 5.1 mEq/L 4.3      4.1      4.3       Chloride 99 - 108 106      105      107  CO2 13 - 22 25      28      21        Calcium 8.7 - 10.7 8.7      9.0      9.1       Alkaline Phos 25 - 125 81      90      124       AST 13 - 35 45      36      37       ALT 7 - 35 U/L 23      23      22           This result is from an external source.      Lab Results  Component Value Date   CEA1 1.8 06/03/2021   /  CEA  Date Value Ref Range Status  06/03/2021 1.8 0.0 - 4.7 ng/mL Final    Comment:    (NOTE)                             Nonsmokers          <3.9                             Smokers             <5.6 [employer] Electrochemiluminescence Immunoassay (ECLIA) Values obtained with different assay methods or kits cannot be used interchangeably.  Results cannot be interpreted as absolute evidence of the presence or absence of malignant disease. Performed At: [facility] [place], [place] [id number] Rush Farmer MD JQ:[id number]     Lab Results  Component Value Date   LDH 186 11/12/2011   LDH 187 09/08/2010   LDH 171 09/09/2009    STUDIES:  No results found.

## 2021-10-08 ENCOUNTER — Inpatient Hospital Stay: Payer: Medicare Other | Attending: Oncology

## 2021-10-08 VITALS — BP 159/70 | HR 83 | Temp 98.1°F | Resp 18 | Ht 60.25 in | Wt 108.0 lb

## 2021-10-08 DIAGNOSIS — Z9221 Personal history of antineoplastic chemotherapy: Secondary | ICD-10-CM | POA: Insufficient documentation

## 2021-10-08 DIAGNOSIS — Z853 Personal history of malignant neoplasm of breast: Secondary | ICD-10-CM | POA: Insufficient documentation

## 2021-10-08 DIAGNOSIS — C787 Secondary malignant neoplasm of liver and intrahepatic bile duct: Secondary | ICD-10-CM | POA: Insufficient documentation

## 2021-10-08 DIAGNOSIS — Z5189 Encounter for other specified aftercare: Secondary | ICD-10-CM | POA: Insufficient documentation

## 2021-10-08 DIAGNOSIS — C779 Secondary and unspecified malignant neoplasm of lymph node, unspecified: Secondary | ICD-10-CM | POA: Diagnosis not present

## 2021-10-08 DIAGNOSIS — Z9012 Acquired absence of left breast and nipple: Secondary | ICD-10-CM | POA: Diagnosis not present

## 2021-10-08 DIAGNOSIS — Z5112 Encounter for antineoplastic immunotherapy: Secondary | ICD-10-CM | POA: Diagnosis present

## 2021-10-08 DIAGNOSIS — I10 Essential (primary) hypertension: Secondary | ICD-10-CM | POA: Diagnosis not present

## 2021-10-08 DIAGNOSIS — C50412 Malignant neoplasm of upper-outer quadrant of left female breast: Secondary | ICD-10-CM | POA: Insufficient documentation

## 2021-10-08 DIAGNOSIS — Z5111 Encounter for antineoplastic chemotherapy: Secondary | ICD-10-CM | POA: Diagnosis present

## 2021-10-08 DIAGNOSIS — I890 Lymphedema, not elsewhere classified: Secondary | ICD-10-CM | POA: Diagnosis not present

## 2021-10-08 DIAGNOSIS — Z85038 Personal history of other malignant neoplasm of large intestine: Secondary | ICD-10-CM | POA: Insufficient documentation

## 2021-10-08 DIAGNOSIS — C7951 Secondary malignant neoplasm of bone: Secondary | ICD-10-CM | POA: Insufficient documentation

## 2021-10-08 DIAGNOSIS — Z171 Estrogen receptor negative status [ER-]: Secondary | ICD-10-CM

## 2021-10-08 MED ORDER — DIPHENHYDRAMINE HCL 25 MG PO CAPS
25.0000 mg | ORAL_CAPSULE | Freq: Once | ORAL | Status: AC
  Administered 2021-10-08: 25 mg via ORAL
  Filled 2021-10-08: qty 1

## 2021-10-08 MED ORDER — SODIUM CHLORIDE 0.9 % IV SOLN
10.0000 mg | Freq: Once | INTRAVENOUS | Status: AC
  Administered 2021-10-08: 10 mg via INTRAVENOUS
  Filled 2021-10-08: qty 10

## 2021-10-08 MED ORDER — SODIUM CHLORIDE 0.9 % IV SOLN
60.0000 mg/m2 | Freq: Once | INTRAVENOUS | Status: AC
  Administered 2021-10-08: 90 mg via INTRAVENOUS
  Filled 2021-10-08: qty 9

## 2021-10-08 MED ORDER — TRASTUZUMAB-DKST CHEMO 150 MG IV SOLR
300.0000 mg | Freq: Once | INTRAVENOUS | Status: AC
  Administered 2021-10-08: 300 mg via INTRAVENOUS
  Filled 2021-10-08: qty 14.29

## 2021-10-08 MED ORDER — SODIUM CHLORIDE 0.9 % IV SOLN
420.0000 mg | Freq: Once | INTRAVENOUS | Status: AC
  Administered 2021-10-08: 420 mg via INTRAVENOUS
  Filled 2021-10-08: qty 14

## 2021-10-08 MED ORDER — HEPARIN SOD (PORK) LOCK FLUSH 100 UNIT/ML IV SOLN
500.0000 [IU] | Freq: Once | INTRAVENOUS | Status: AC | PRN
  Administered 2021-10-08: 500 [IU]

## 2021-10-08 MED ORDER — ACETAMINOPHEN 325 MG PO TABS
650.0000 mg | ORAL_TABLET | Freq: Once | ORAL | Status: AC
  Administered 2021-10-08: 650 mg via ORAL
  Filled 2021-10-08: qty 2

## 2021-10-08 MED ORDER — SODIUM CHLORIDE 0.9% FLUSH
10.0000 mL | INTRAVENOUS | Status: DC | PRN
  Administered 2021-10-08: 10 mL

## 2021-10-08 MED ORDER — SODIUM CHLORIDE 0.9 % IV SOLN: Freq: Once | INTRAVENOUS | Status: AC

## 2021-10-08 NOTE — Patient Instructions (Signed)
Docetaxel injection What is this medication? DOCETAXEL (doe se TAX el) is a chemotherapy drug. It targets fast dividing cells, like cancer cells, and causes these cells to die. This medicine is used to treat many types of cancers like breast cancer, certain stomach cancers, head and neck cancer, lung cancer, and prostate cancer. This medicine may be used for other purposes; ask your health care provider or pharmacist if you have questions. COMMON BRAND NAME(S): Docefrez, Taxotere What should I tell my care team before I take this medication? They need to know if you have any of these conditions: infection (especially a virus infection such as chickenpox, cold sores, or herpes) liver disease low blood counts, like low white cell, platelet, or red cell counts an unusual or allergic reaction to docetaxel, polysorbate 80, other chemotherapy agents, other medicines, foods, dyes, or preservatives pregnant or trying to get pregnant breast-feeding How should I use this medication? This drug is given as an infusion into a vein. It is administered in a hospital or clinic by a specially trained health care professional. Talk to your pediatrician regarding the use of this medicine in children. Special care may be needed. Overdosage: If you think you have taken too much of this medicine contact a poison control center or emergency room at once. NOTE: This medicine is only for you. Do not share this medicine with others. What if I miss a dose? It is important not to miss your dose. Call your doctor or health care professional if you are unable to keep an appointment. What may interact with this medication? Do not take this medicine with any of the following medications: live virus vaccines This medicine may also interact with the following medications: aprepitant certain antibiotics like erythromycin or clarithromycin certain antivirals for HIV or hepatitis certain medicines for fungal infections like  fluconazole, itraconazole, ketoconazole, posaconazole, or voriconazole cimetidine ciprofloxacin conivaptan cyclosporine dronedarone fluvoxamine grapefruit juice imatinib verapamil This list may not describe all possible interactions. Give your health care provider a list of all the medicines, herbs, non-prescription drugs, or dietary supplements you use. Also tell them if you smoke, drink alcohol, or use illegal drugs. Some items may interact with your medicine. What should I watch for while using this medication? Your condition will be monitored carefully while you are receiving this medicine. You will need important blood work done while you are taking this medicine. Call your doctor or health care professional for advice if you get a fever, chills or sore throat, or other symptoms of a cold or flu. Do not treat yourself. This drug decreases your body's ability to fight infections. Try to avoid being around people who are sick. Some products may contain alcohol. Ask your health care professional if this medicine contains alcohol. Be sure to tell all health care professionals you are taking this medicine. Certain medicines, like metronidazole and disulfiram, can cause an unpleasant reaction when taken with alcohol. The reaction includes flushing, headache, nausea, vomiting, sweating, and increased thirst. The reaction can last from 30 minutes to several hours. You may get drowsy or dizzy. Do not drive, use machinery, or do anything that needs mental alertness until you know how this medicine affects you. Do not stand or sit up quickly, especially if you are an older patient. This reduces the risk of dizzy or fainting spells. Alcohol may interfere with the effect of this medicine. Talk to your health care professional about your risk of cancer. You may be more at risk for certain types   of cancer if you take this medicine. Do not become pregnant while taking this medicine or for 6 months after  stopping it. Women should inform their doctor if they wish to become pregnant or think they might be pregnant. There is a potential for serious side effects to an unborn child. Talk to your health care professional or pharmacist for more information. Do not breast-feed an infant while taking this medicine or for 1 week after stopping it. Males who get this medicine must use a condom during sex with females who can get pregnant. If you get a woman pregnant, the baby could have birth defects. The baby could die before they are born. You will need to continue wearing a condom for 3 months after stopping the medicine. Tell your health care provider right away if your partner becomes pregnant while you are taking this medicine. This may interfere with the ability to father a child. You should talk to your doctor or health care professional if you are concerned about your fertility. What side effects may I notice from receiving this medication? Side effects that you should report to your doctor or health care professional as soon as possible: allergic reactions like skin rash, itching or hives, swelling of the face, lips, or tongue blurred vision breathing problems changes in vision low blood counts - This drug may decrease the number of white blood cells, red blood cells and platelets. You may be at increased risk for infections and bleeding. nausea and vomiting pain, redness or irritation at site where injected pain, tingling, numbness in the hands or feet redness, blistering, peeling, or loosening of the skin, including inside the mouth signs of decreased platelets or bleeding - bruising, pinpoint red spots on the skin, black, tarry stools, nosebleeds signs of decreased red blood cells - unusually weak or tired, fainting spells, lightheadedness signs of infection - fever or chills, cough, sore throat, pain or difficulty passing urine swelling of the ankle, feet, hands Side effects that usually do not  require medical attention (report to your doctor or health care professional if they continue or are bothersome): constipation diarrhea fingernail or toenail changes hair loss loss of appetite mouth sores muscle pain This list may not describe all possible side effects. Call your doctor for medical advice about side effects. You may report side effects to FDA at 1-800-FDA-1088. Where should I keep my medication? This drug is given in a hospital or clinic and will not be stored at home. NOTE: This sheet is a summary. It may not cover all possible information. If you have questions about this medicine, talk to your doctor, pharmacist, or health care provider.  2023 Elsevier/Gold Standard (2021-03-27 00:00:00) Pertuzumab injection What is this medication? PERTUZUMAB (per TOOZ ue mab) is a monoclonal antibody. It is used to treat breast cancer. This medicine may be used for other purposes; ask your health care provider or pharmacist if you have questions. COMMON BRAND NAME(S): PERJETA What should I tell my care team before I take this medication? They need to know if you have any of these conditions: heart disease heart failure high blood pressure history of irregular heart beat recent or ongoing radiation therapy an unusual or allergic reaction to pertuzumab, other medicines, foods, dyes, or preservatives pregnant or trying to get pregnant breast-feeding How should I use this medication? This medicine is for infusion into a vein. It is given by a health care professional in a hospital or clinic setting. Talk to your pediatrician regarding the   use of this medicine in children. Special care may be needed. Overdosage: If you think you have taken too much of this medicine contact a poison control center or emergency room at once. NOTE: This medicine is only for you. Do not share this medicine with others. What if I miss a dose? It is important not to miss your dose. Call your doctor or  health care professional if you are unable to keep an appointment. What may interact with this medication? Interactions are not expected. Give your health care provider a list of all the medicines, herbs, non-prescription drugs, or dietary supplements you use. Also tell them if you smoke, drink alcohol, or use illegal drugs. Some items may interact with your medicine. This list may not describe all possible interactions. Give your health care provider a list of all the medicines, herbs, non-prescription drugs, or dietary supplements you use. Also tell them if you smoke, drink alcohol, or use illegal drugs. Some items may interact with your medicine. What should I watch for while using this medication? Your condition will be monitored carefully while you are receiving this medicine. Report any side effects. Continue your course of treatment even though you feel ill unless your doctor tells you to stop. Do not become pregnant while taking this medicine or for 7 months after stopping it. Women should inform their doctor if they wish to become pregnant or think they might be pregnant. Women of child-bearing potential will need to have a negative pregnancy test before starting this medicine. There is a potential for serious side effects to an unborn child. Talk to your health care professional or pharmacist for more information. Do not breast-feed an infant while taking this medicine or for 7 months after stopping it. Women must use effective birth control with this medicine. Call your doctor or health care professional for advice if you get a fever, chills or sore throat, or other symptoms of a cold or flu. Do not treat yourself. Try to avoid being around people who are sick. You may experience fever, chills, and headache during the infusion. Report any side effects during the infusion to your health care professional. What side effects may I notice from receiving this medication? Side effects that you  should report to your doctor or health care professional as soon as possible: breathing problems chest pain or palpitations dizziness feeling faint or lightheaded fever or chills skin rash, itching or hives sore throat swelling of the face, lips, or tongue swelling of the legs or ankles unusually weak or tired Side effects that usually do not require medical attention (report to your doctor or health care professional if they continue or are bothersome): diarrhea hair loss nausea, vomiting tiredness This list may not describe all possible side effects. Call your doctor for medical advice about side effects. You may report side effects to FDA at 1-800-FDA-1088. Where should I keep my medication? This drug is given in a hospital or clinic and will not be stored at home. NOTE: This sheet is a summary. It may not cover all possible information. If you have questions about this medicine, talk to your doctor, pharmacist, or health care provider.  2023 Elsevier/Gold Standard (2015-05-29 00:00:00) Trastuzumab injection for infusion What is this medication? TRASTUZUMAB (tras TOO zoo mab) is a monoclonal antibody. It is used to treat breast cancer and stomach cancer. This medicine may be used for other purposes; ask your health care provider or pharmacist if you have questions. COMMON BRAND NAME(S): Herceptin, Herzuma,   KANJINTI, Ogivri, Ontruzant, Trazimera What should I tell my care team before I take this medication? They need to know if you have any of these conditions: heart disease heart failure lung or breathing disease, like asthma an unusual or allergic reaction to trastuzumab, benzyl alcohol, or other medications, foods, dyes, or preservatives pregnant or trying to get pregnant breast-feeding How should I use this medication? This drug is given as an infusion into a vein. It is administered in a hospital or clinic by a specially trained health care professional. Talk to your  pediatrician regarding the use of this medicine in children. This medicine is not approved for use in children. Overdosage: If you think you have taken too much of this medicine contact a poison control center or emergency room at once. NOTE: This medicine is only for you. Do not share this medicine with others. What if I miss a dose? It is important not to miss a dose. Call your doctor or health care professional if you are unable to keep an appointment. What may interact with this medication? This medicine may interact with the following medications: certain types of chemotherapy, such as daunorubicin, doxorubicin, epirubicin, and idarubicin This list may not describe all possible interactions. Give your health care provider a list of all the medicines, herbs, non-prescription drugs, or dietary supplements you use. Also tell them if you smoke, drink alcohol, or use illegal drugs. Some items may interact with your medicine. What should I watch for while using this medication? Visit your doctor for checks on your progress. Report any side effects. Continue your course of treatment even though you feel ill unless your doctor tells you to stop. Call your doctor or health care professional for advice if you get a fever, chills or sore throat, or other symptoms of a cold or flu. Do not treat yourself. Try to avoid being around people who are sick. You may experience fever, chills and shaking during your first infusion. These effects are usually mild and can be treated with other medicines. Report any side effects during the infusion to your health care professional. Fever and chills usually do not happen with later infusions. Do not become pregnant while taking this medicine or for 7 months after stopping it. Women should inform their doctor if they wish to become pregnant or think they might be pregnant. Women of child-bearing potential will need to have a negative pregnancy test before starting this  medicine. There is a potential for serious side effects to an unborn child. Talk to your health care professional or pharmacist for more information. Do not breast-feed an infant while taking this medicine or for 7 months after stopping it. Women must use effective birth control with this medicine. What side effects may I notice from receiving this medication? Side effects that you should report to your doctor or health care professional as soon as possible: allergic reactions like skin rash, itching or hives, swelling of the face, lips, or tongue chest pain or palpitations cough dizziness feeling faint or lightheaded, falls fever general ill feeling or flu-like symptoms signs of worsening heart failure like breathing problems; swelling in your legs and feet unusually weak or tired Side effects that usually do not require medical attention (report to your doctor or health care professional if they continue or are bothersome): bone pain changes in taste diarrhea joint pain nausea/vomiting weight loss This list may not describe all possible side effects. Call your doctor for medical advice about side effects. You may   report side effects to FDA at 1-800-FDA-1088. Where should I keep my medication? This drug is given in a hospital or clinic and will not be stored at home. NOTE: This sheet is a summary. It may not cover all possible information. If you have questions about this medicine, talk to your doctor, pharmacist, or health care provider.  2023 Elsevier/Gold Standard (2016-05-11 00:00:00)  

## 2021-10-09 ENCOUNTER — Inpatient Hospital Stay: Payer: Medicare Other

## 2021-10-09 VITALS — BP 152/72 | HR 78 | Temp 98.2°F | Resp 18

## 2021-10-09 DIAGNOSIS — Z5112 Encounter for antineoplastic immunotherapy: Secondary | ICD-10-CM | POA: Diagnosis not present

## 2021-10-09 DIAGNOSIS — C7951 Secondary malignant neoplasm of bone: Secondary | ICD-10-CM

## 2021-10-09 DIAGNOSIS — C50412 Malignant neoplasm of upper-outer quadrant of left female breast: Secondary | ICD-10-CM

## 2021-10-09 DIAGNOSIS — C787 Secondary malignant neoplasm of liver and intrahepatic bile duct: Secondary | ICD-10-CM

## 2021-10-09 MED ORDER — PEGFILGRASTIM-CBQV 6 MG/0.6ML ~~LOC~~ SOSY
6.0000 mg | PREFILLED_SYRINGE | Freq: Once | SUBCUTANEOUS | Status: AC
  Administered 2021-10-09: 6 mg via SUBCUTANEOUS
  Filled 2021-10-09: qty 0.6

## 2021-10-21 ENCOUNTER — Other Ambulatory Visit: Payer: Self-pay | Admitting: Oncology

## 2021-10-21 ENCOUNTER — Encounter: Payer: Self-pay | Admitting: Oncology

## 2021-10-21 NOTE — Assessment & Plan Note (Deleted)
Stage IIB (T2c N1 M0) HER2 receptor positive, ER/PR negative, left breast cancer diagnosed in June 2020.  She was treated with neoadjuvant Kadcyla/Perjeta followed by left mastectomy.  She had a complete response in breast and nodes. She had only received two doses of adjuvant trastuzumab before discontinuing in December 2020.

## 2021-10-21 NOTE — Assessment & Plan Note (Addendum)
Liver metastases diagnosed in December 2022.    Imaging revealed numerous heterogeneously enhancing, internally necrotic lesions in the right hepatic lobe, consistent with metastatic disease.  The largest lesion measures up to 6.7 cm and there were at least 6 lesions.  Liver biopsy revealed metastatic HER2 positive breast cancer.  Estrogen and progesterone receptors were negative. She was initially treated with single agent trastuzumab, but due to progressive skin metastasis, we added docetaxel and pertuzumab in February.  She presented with elevated transaminases which normalized with treatment.  CT chest, abdomen and pelvis in May revealed excellent response with significant decrease in the size of her liver lesions.   She has completed 6 cycles of docetaxel/trastuzumab/pertuzumab.  She continues to tolerate this fairly well.  She has excessive tearin, g so I recommended she see an ophthalmologist for possible lacrimal duct obstruction but she prefers to wait on this.  She does not have evidence of progression based on today's evaluation.  She has had such a good response, Dr. Hinton Rao recommends continuing the same regimen for a few more cycles as long as she does not develop severe toxicities.  She will proceed with 7th cycle this week.

## 2021-10-22 NOTE — Assessment & Plan Note (Signed)
She has had a decrease in the skin metastasis

## 2021-10-22 NOTE — Assessment & Plan Note (Signed)
Bone metastases diagnosed in December 2022. Enhancing osseous lesions were present in the L2 vertebral body, both iliac bones, and in the sacrum, highly concerning for osseous metastases. She was started on zoledronic acid in January.  CT imaging in May revealed the widespread bone metastases to show an interval increase in sclerosis of previously lytic lesions, which is consistent with treatment response.  She receives zoledronic acid every 6 weeks.

## 2021-10-22 NOTE — Progress Notes (Signed)
[place]  [address] [place],  [place]  [address] [phone]  Clinic Day:  10/28/2021  Referring physician: [name], MD  ASSESSMENT & PLAN:   Assessment & Plan: Malignant neoplasm metastatic to liver [facility]) Liver metastases diagnosed in December 2022.    Imaging revealed numerous heterogeneously enhancing, internally necrotic lesions in the right hepatic lobe, consistent with metastatic disease.  The largest lesion measures up to 6.7 cm and there were at least 6 lesions.  Liver biopsy revealed metastatic HER2 positive breast cancer.  Estrogen and progesterone receptors were negative. She was initially treated with single agent trastuzumab, but due to progressive skin metastasis, we added docetaxel and pertuzumab in February.  She presented with elevated transaminases which normalized with treatment.  CT chest, abdomen and pelvis in May revealed excellent response with significant decrease in the size of her liver lesions.   She has completed 6 cycles of docetaxel/trastuzumab/pertuzumab.  She continues to tolerate this fairly well.  She has excessive tearin, g so I recommended she see an ophthalmologist for possible lacrimal duct obstruction but she prefers to wait on this.  She does not have evidence of progression based on today's evaluation.  She has had such a good response, Dr. Hinton Rao recommends continuing the same regimen for a few more cycles as long as she does not develop severe toxicities.  She will proceed with 7th cycle this week.  Malignant neoplasm metastatic to bone [facility]) Bone metastases diagnosed in December 2022. Enhancing osseous lesions were present in the L2 vertebral body, both iliac bones, and in the sacrum, highly concerning for osseous metastases. She was started on zoledronic acid in January.  CT imaging in May revealed the widespread bone metastases to show an interval increase in sclerosis of previously lytic lesions, which  is consistent with treatment response.  She receives zoledronic acid every 6 weeks.  She feels she may have some increasing numbness and weakness in the left hand, I will talk to Dr. Hinton Rao about imaging of the cervical spine.  Chest wall recurrence of breast cancer, left ([place]) Her skin metastases continue to improve.  Malignant neoplasm of upper-outer quadrant of left breast in female, estrogen receptor negative ([place]) Stage IIB (T2c N1 M0) HER2 receptor positive, ER/PR negative, left breast cancer diagnosed in June 2020.  She was treated with neoadjuvant Kadcyla/Perjeta followed by left mastectomy.  She had a complete response in breast and nodes. She had only received two doses of adjuvant trastuzumab before discontinuing in December 2020.   She will proceed with 7th cycle of palliative docetaxel/trastuzumab/pertuzumab (THP), as well as zoledronic acid this week.  We will plan to see her back in 3 weeks for repeat clinical assessment prior to 8th cycle.  The patient understands the plans discussed today and is in agreement with them.  She knows to contact our office if she develops concerns prior to her next appointment.   I provided 30 minutes of face-to-face time during this encounter and > 50% was spent counseling as documented under my assessment and plan.    Marvia Pickles, PA-C  [facility] AT [facility] [address] [place] [address] Dept: [phone] Dept Fax: [phone]   Orders Placed This Encounter  Procedures   CBC and differential    This external order was created through the Results Console.   CBC    This external order was created through the Results Console.   Basic  metabolic panel    This external order was created through the Results Console.   Comprehensive metabolic panel    This external order was created through the Results Console.   Hepatic function panel    This external order was created through  the Results Console.   CBC    This order was created through External Result Entry      CHIEF COMPLAINT:  CC: Metastatic HER2 receptor positive breast cancer  Current Treatment: Docetaxel/trastuzumab/pertuzumab every 3 weeks  HISTORY OF PRESENT ILLNESS:  Jasmin Lewis is a 88 y.o. female who we began seeing in January for transfer of care for recurrent stage IIB left breast cancer. She was originally diagnosed in June 2020 when she was able to palpate a mass of the left breast. Mammogram and ultrasound from May 2020 revealed a 2.9 cm irregular mass in the upper outer left breast as well as two adjacent abnormal left axillary lymph nodes. 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 Left axillary lymph node biopsy was positive for invasive ductal carcinoma. HER2 was positive 3+. Estrogen and progesterone receptors were both negative at 0%, and Ki67 was 70%. Breast MRI from July revealed a 3.3 cm mass of the lateral portion of the left breast as well as significant non mass enhancement surrounding this mass and extending anteriorly into the nipple base, with largest diameter in the anterior to posterior axis, measuring 7.2 centimeters. Three enlarged left axillary lymph nodes. Right breast was negative.  PET imaging and July 2020 revealed no evidence of additional metastatic involvement. She received neoadjuvant Kadcyla/Perjeta for 6 cycles. Repeat breast MRI and October 2020 revealed complete resolution of the enhancing mass and associated non mass enhancement within the left breast. Previously identified left axillary lymphadenopathy was no longer identified. She underwent left mastectomy and targeted lymph node dissection in November 2020.  Pathology revealed a complete response with no residual disease.. Three sentinel lymph nodes were negative for malignancy (0/3). She continued maintenance Herceptin injections for two more doses, but this was discontinued in  December 2020, as she refused further ECHO scans. She has had chronic lymphedema of the left upper extremity since November 2022 after an accident, for which she wears a compression sleeve. However, her skin is reacting to the plastic cuff of the sleeve, even though she has tried turning it inside out. She has limited mobility of the right upper extremity due to prior incident of dislocated shoulder.   Of note, she has a history of stage II colon cancer, diagnosed in May 2007. Preop CEA was 5.2. She underwent surgical resection and surgical pathology revealed a 5.9 x 3.8 x 1.2 cm moderate to well-differentiated adenocarcinoma penetrating the muscular wall. Forty lymph nodes negative. No vascular or lymphatic invasion. Multiple diverticula with microabscess formation and inflammation. Carcinoma present in at least 1 diverticulum. She received adjuvant chemotherapy with Xeloda for 9 months. Colonoscopy from August 2013 revealed 2 polyps, which were resected and chronic diverticulosis.   She presented to [facility] ER in December 2022 due to constipation and back pain with spasms. CT abdomen/pelvis revealed new lytic lesion involving the L2 vertebral body with minimal cortical breakthrough superiorly, but with preservation of vertebral body height, concerning for metastatic disease. There were several indeterminate hepatic lesions. In addition to the lesions, there is an additional subtle 1.5 cm hypodensity in the hepatic dome. MRI abdomen in January revealed numerous heterogeneously enhancing, internally necrotic lesions in the right hepatic lobe, highly concerning for metastatic disease.  The largest lesion measures up to 6.7 cm in hepatic segment VI. Enhancing osseous lesions in the L2 vertebral body, both iliac bones, and in the sacrum, highly concerning for osseous metastases were also noted.  Further evaluation with PET scan, MRI brain and spine, as well as echocardiogram were recommended. We also referred her  to Dr. Lilia Pro for node biopsy and port placement. When he saw her, he found a nodule in the upper left chest wall which was suspicious and biopsied this rather than the left supraclavicular node. Pathology revealed carcinoma, 5 mm nodule, involving dermis, subcutaneous tissue and lymphovascular space. CK7 positive, estrogen staining is mostly negative, but there was some faint positivity. HER2 was positive 3+. Estrogen and progesterone receptors were negative. Ki67 was 70%.  This was compatible with metastatic breast carcinoma from her previous cancer.   She was initially treated with single agent trastuzumab in January, but had progressive skin metastasis and palpable adenopathy, so docetaxel and pertuzumab were added in February.  Repeat imaging in May revealed good response to treatment.  She has completed 6 cycles of docetaxel/trastuzumab/pertuzumab.   Oncology History Overview Note  Cancer Staging Malignant neoplasm of upper-outer quadrant of left breast in female, estrogen receptor negative ([place]) Staging form: Breast, AJCC 8th Edition - Clinical stage from 10/11/2018: Stage IIB (cT2, cN1, cM0, G3, ER-, PR-, HER2+) - Signed by Truitt Merle, MD on 11/02/2018 - Clinical: No stage assigned - Unsigned    History of colon cancer, stage II  09/2005 Initial Diagnosis   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 Findings were a 5.9 x 3.8 x 1.2 cm moderate to well-differentiated adenocarcinoma penetrating the muscular wall. Forty lymph nodes negative. No vascular or lymphatic invasion. Multiple diverticula with microabscess formation and inflammation. Carcinoma present in at least 1 diverticulum. Preop CEA 5.2 with lab normal range 0 to 2.5. Preop CT scan with no obvious additional pathology.    2007 -  Chemotherapy   She received oral Xeloda chemotherapy as an adjuvant. She declined treatment on a clinical  trial.    11/12/2011 Initial Diagnosis   [name], stage II   12/09/2011 Procedure   Followup colonoscopy done on 12/09/2011. She was found to have 2 polyps which were removed. Chronic diverticulosis.     Malignant neoplasm of upper-outer quadrant of left breast in female, estrogen receptor negative ([place])  09/26/2018 Mammogram   Mammogram/US of left breast 09/26/18 IMPRESSION:  1. 2.9cm irregular mass in the upper outer left breast corresponds to the palpable abnormality. This is highly suspicious for breast carcinoma.  2. Two adjacent abnormal left axillary  Lymph nodes suspicious for metastatic adenopathy. There is a third borderline abnormal left axillary LN with a cortex thickened to 29m.  3. Benign right breast cyst. No evidence of right breast malignancy.    10/11/2018 Cancer Staging   Staging form: Breast, AJCC 8th Edition - Clinical stage from 10/11/2018: Stage IIB (cT2, cN1, cM0, G3, ER-, PR-, HER2+) - Signed by [name] MD on 11/02/2018   10/11/2018 Initial Biopsy   Diagnosis 10/11/18 1. Breast, left, needle core biopsy, upper outer left 2 o'clock - INVASIVE DUCTAL CARCINOMA. - DUCTAL CARCINOMA IN SITU. - LYMPHOVASCULAR INVASION IS IDENTIFIED. - SEE COMMENT. 2. Lymph node, needle/core biopsy, left axilla - INVASIVE DUCTAL CARCINOMA. - SEE COMMENT.   10/11/2018 Receptors her2   The tumor cells are POSITIVE for Her2 (3+). Estrogen Receptor:  0%, NEGATIVE Progesterone Receptor: 0%, NEGATIVE Proliferation Marker Ki67: 70%   11/02/2018 Initial Diagnosis   Malignant neoplasm of upper-outer quadrant of left breast in female, estrogen receptor negative ([place])   11/15/2018 Breast MRI   MRI breast 11/15/18  IMPRESSION: 1. 3.3 centimeter mass in the LATERAL portion of the LEFT breast consistent with known malignancy. 2. There is significant non mass enhancement surrounding this mass and extending anteriorly into the nipple base, with largest diameter in the anterior to posterior  axis, measuring 7.2 centimeters. 3. If the patient would consider breast conservation, additional MR guided core biopsies are recommended. Consider biopsy of the inferior and anterior extent of the non mass enhancement to document extent of disease. 4. Three enlarged LEFT axillary lymph nodes. 5. RIGHT breast is negative.   11/15/2018 PET scan   PET 11/15/18 IMPRESSION: Hypermetabolic left breast lesion compatible with known primary. Hypermetabolic left axillary lymph nodes are consistent with metastatic disease.   No evidence for additional hypermetabolic metastatic involvement in the neck, chest, abdomen, or pelvis.   11/17/2018 - 03/01/2019 Chemotherapy   Neo-adjuvant Kadcyla and perjeta q3weeks for 6 cycles starting 11/17/18. Stopped before surgery    11/29/2018 Pathology Results   Diagnosis 1. Breast, left, needle core biopsy, inferior anterior (cylinder clip) - INVASIVE DUCTAL CARCINOMA. - DUCTAL CARCINOMA IN SITU. - LYMPHOVASCULAR INVASION IS IDENTIFIED. - SEE COMMENT. 2. Breast, left, needle core biopsy, central posterior (barbell clip) - INVASIVE DUCTAL CARCINOMA. - LYMPHOVASCULAR INVASION IS IDENTIFIED.   02/12/2019 Breast MRI   IMPRESSION: 1. Complete resolution of previously identified enhancing mass and associated non mass enhancement within the left breast. This is consistent with excellent response to chemotherapy. No residual or suspicious findings are identified. 2. No MRI evidence of malignancy on the right. 3. Previously identified left axillary lymphadenopathy not definitively seen on today's study. However, this may be due to decreased field-of-view compared to prior study.     03/14/2019 Cancer Staging   Staging form: Breast, AJCC 8th Edition - Pathologic stage from 03/14/2019: pT0, pN0, cM0, GX, ER: Unknown, PR: Unknown, HER2: Not Assessed - Signed by Truitt Merle, MD on 03/28/2019   03/14/2019 Surgery   LEFT MASTECTOMY WITH TARGETED LYMPH NODE DISSECTION and  Left Axillary Sentinel Lymph  Node Biopsy by Dr [name] 03/14/19    03/14/2019 Pathology Results   FINAL MICROSCOPIC DIAGNOSIS:   A. LYMPH NODE, LEFT, SENTINEL, BIOPSY:  - There is no evidence of carcinoma in 1 of 1 lymph node (0/1).   B. BREAST, LEFT, MASTECTOMY:  - Benign breast parenchyma with treatment-related changes.  - There is no evidence of malignancy.  - See oncology table below.   C. LYMPH NODE, LEFT #1, SENTINEL, BIOPSY:  - There is no evidence of carcinoma in 1 of 1 lymph node (0/1).   D. LYMPH NODE, LEFT #2, SENTINEL, BIOPSY:  - There is no evidence of carcinoma in 1 of 1 lymph node (0/1).     04/04/2019 - 04/25/2019 Chemotherapy   Maintenance Herceptin injections every 3 weeks starting 04/03/19 to complete 1 year of treatment that was started in 11/2018. Stopped after 2nd dose as she will not be repeating Echos.    06/03/2021 - 06/24/2021 Chemotherapy   Patient is on Treatment Plan : BREAST Trastuzumab q21d X 11 Cycles     06/25/2021 -  Chemotherapy   Patient is on Treatment Plan : BREAST Docetaxel + Trastuzumab + Pertuzumab (THP) q21d     09/14/2021 Imaging   CT chest/abdomen/pelvis  IMPRESSION:  1. Interval resolution of previously seen bulky left axillary and  subpectoral lymphadenopathy. No persistently enlarged lymph nodes.  2. Multiple liver lesions are significantly diminished in size.  3. Widespread osseous metastatic disease, with an interval increase  in sclerosis of several previously lytic lesions.  4. Constellation of findings is consistent with treatment response  of metastatic disease  5. New, although age indeterminate pathologic wedge deformity of the  T6 vertebral body as well as an increased pathologic wedge deformity  of the L2 vertebral body.  6. Coronary artery disease.  Aortic Atherosclerosis (ICD10-I70.0).    Malignant neoplasm metastatic to liver ([place])  04/17/2021 Imaging   CT ABDOMEN AND PELVIS WITH CONTRAST: -New lytic lesion involving  the L2 vertebral body (6:72, 2:26) with minimal cortical breakthrough superiorly, but with preservation of vertebral body height, concerning for metastatic disease.  -There are several indeterminate hepatic lesions as described above. In addition to the lesions described above, there is an additional subtle 1.5 cm hypodensity in the hepatic dome (2:7). Given clinical history and presence of a new L2 lesion, leading differential consideration is metastatic disease. Recommend further evaluation with MRI of the abdomen with and without contrast.   05/12/2021 Imaging   MRI ABDOMEN WITH AND WITHOUT CONTRAST: Numerous heterogeneously enhancing, internally necrotic lesions in the right hepatic lobe, highly concerning for metastatic disease. Largest lesion measures up to 6.7 cm in hepatic segment VI.   Enhancing osseous lesions in the L2 vertebral body, both iliac bones, and in the sacrum, highly concerning for osseous metastases.   05/15/2021 Initial Diagnosis   Liver metastases ([place])   05/27/2021 PET scan   1. Widespread recurrent/metastatic disease in this patient who is  status post bilateral mastectomy. Left chest wall recurrence with  left axillary/subpectoral, supraclavicular nodal metastasis.  2. Hepatic, left adrenal, and widespread osseous metastasis.  3. Incidental findings, including: Right nephrolithiasis. Tiny  hiatal hernia.    05/27/2021 Imaging   MRI LUMBAR AND THORACIC SPINE: Thoracic spine:  Osseous metastatic disease involving each level. The most dramatic deposits are at T4 and T6 where there is extraosseous/epidural tumor. No cord compression but there is foraminal impingement on the left at T6-7 and on the right at T3-4. Early dorsal epidural tumor at the level of T10 and T3.   Lumbar spine:  1. Widespread osseous metastatic disease with largest deposit  replacing the L2 body where there is a compression fracture with  mild height loss. Also at this level is early epidural tumor   extension likely affecting both L2-3 foramina.  2. 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    05/27/2021 Imaging   MRI HEAD WITH AND WITHOUT CONTRAST: Negative for metastatic disease to the brain or calvarium.   06/03/2021 - 06/24/2021 Chemotherapy   Patient is on Treatment Plan : BREAST Trastuzumab q21d X 11 Cycles     06/25/2021 -  Chemotherapy   Patient is on Treatment Plan : BREAST Docetaxel + Trastuzumab + Pertuzumab (THP) q21d     09/14/2021 Imaging   CT chest/abdomen/pelvis  IMPRESSION:  1. Interval resolution of previously seen bulky left axillary and  subpectoral lymphadenopathy. No persistently enlarged lymph nodes.  2. Multiple liver lesions are significantly diminished in size.  3. Widespread osseous metastatic disease, with an interval increase  in sclerosis of several previously lytic lesions.  4. Constellation of findings is consistent with treatment response  of metastatic disease  5. New, although age indeterminate pathologic wedge deformity of the  T6 vertebral  body as well as an increased pathologic wedge deformity  of the L2 vertebral body.  6. Coronary artery disease.  Aortic Atherosclerosis (ICD10-I70.0).    Malignant neoplasm metastatic to bone ([place])  04/17/2021 Imaging   CT ABDOMEN AND PELVIS WITH CONTRAST: -New lytic lesion involving the L2 vertebral body (6:72, 2:26) with minimal cortical breakthrough superiorly, but with preservation of vertebral body height, concerning for metastatic disease.  -There are several indeterminate hepatic lesions as described above. In addition to the lesions described above, there is an additional subtle 1.5 cm hypodensity in the hepatic dome (2:7). Given clinical history and presence of a new L2 lesion, leading differential consideration is metastatic disease. Recommend further evaluation with MRI of the abdomen with and without contrast.   05/12/2021 Imaging   MRI ABDOMEN WITH AND WITHOUT  CONTRAST: Numerous heterogeneously enhancing, internally necrotic lesions in the right hepatic lobe, highly concerning for metastatic disease. Largest lesion measures up to 6.7 cm in hepatic segment VI.   Enhancing osseous lesions in the L2 vertebral body, both iliac bones, and in the sacrum, highly concerning for osseous metastases.   05/15/2021 Initial Diagnosis   Bone metastases ([place])   05/27/2021 PET scan   1. Widespread recurrent/metastatic disease in this patient who is  status post bilateral mastectomy. Left chest wall recurrence with  left axillary/subpectoral, supraclavicular nodal metastasis.  2. Hepatic, left adrenal, and widespread osseous metastasis.  3. Incidental findings, including: Right nephrolithiasis. Tiny  hiatal hernia.    05/27/2021 Imaging   MRI LUMBAR AND THORACIC SPINE: Thoracic spine:  Osseous metastatic disease involving each level. The most dramatic deposits are at T4 and T6 where there is extraosseous/epidural tumor. No cord compression but there is foraminal impingement on the left at T6-7 and on the right at T3-4. Early dorsal epidural tumor at the level of T10 and T3.   Lumbar spine:  1. Widespread osseous metastatic disease with largest deposit  replacing the L2 body where there is a compression fracture with  mild height loss. Also at this level is early epidural tumor  extension likely affecting both L2-3 foramina.  2. 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    05/27/2021 Imaging   MRI HEAD WITH AND WITHOUT CONTRAST: Negative for metastatic disease to the brain or calvarium.   06/03/2021 - 06/24/2021 Chemotherapy   Patient is on Treatment Plan : BREAST Trastuzumab q21d X 11 Cycles     06/25/2021 -  Chemotherapy   Patient is on Treatment Plan : BREAST Docetaxel + Trastuzumab + Pertuzumab (THP) q21d     09/14/2021 Imaging   CT chest/abdomen/pelvis  IMPRESSION:  1. Interval resolution of previously seen bulky left  axillary and  subpectoral lymphadenopathy. No persistently enlarged lymph nodes.  2. Multiple liver lesions are significantly diminished in size.  3. Widespread osseous metastatic disease, with an interval increase  in sclerosis of several previously lytic lesions.  4. Constellation of findings is consistent with treatment response  of metastatic disease  5. New, although age indeterminate pathologic wedge deformity of the  T6 vertebral body as well as an increased pathologic wedge deformity  of the L2 vertebral body.  6. Coronary artery disease.  Aortic Atherosclerosis (ICD10-I70.0).        INTERVAL HISTORY:  Jasmin Lewis is here today for repeat clinical assessment prior to his seventh cycle of palliative docetaxel/trastuzumab/pertuzumab.  She states she had more difficulty with the last cycle with excessive tearing from appetite changes.  She reports improved lymphedema of the  left arm, but perhaps worsening numbness and weakness of the left hand.  She has seen a hand specialist, but did not feel she was getting any results.  She denies any changes in her skin.  She denies abdominal pain.  She denies fevers or chills. She denies pain. Her appetite varies. Her weight has increased 4 pounds over last 3 weeks .  Her son accompanies her and asks about a scan of her cervical spine due to the numbness and weakness of her left hand.  On CT imaging last month all of the bony metastasis had increasing sclerosis consistent with treatment effect.  The cervical spine was not included in imaging.  REVIEW OF SYSTEMS:  Review of Systems  Constitutional:  Negative for appetite change, chills, fatigue, fever and unexpected weight change.  HENT:   Negative for lump/mass, mouth sores and sore throat.   Respiratory:  Negative for cough and shortness of breath.   Cardiovascular:  Negative for chest pain and leg swelling.  Gastrointestinal:  Negative for abdominal pain, constipation, diarrhea, nausea and vomiting.   Endocrine: Negative for hot flashes.  Genitourinary:  Negative for difficulty urinating, dysuria, frequency and hematuria.   Musculoskeletal:  Negative for arthralgias, back pain and myalgias.  Skin:  Negative for rash.  Neurological:  Negative for dizziness and headaches.  Hematological:  Negative for adenopathy. Does not bruise/bleed easily.  Psychiatric/Behavioral:  Negative for depression and sleep disturbance. The patient is not nervous/anxious.      VITALS:  Blood pressure (!) 157/68, pulse 71, temperature 97.9 F (36.6 C), temperature source Oral, resp. rate 18, height 5' 0.25" (1.53 m), weight 112 lb 4.8 oz (50.9 kg), SpO2 96 %.  Wt Readings from Last 3 Encounters:  10/28/21 112 lb 4.8 oz (50.9 kg)  10/08/21 108 lb (49 kg)  10/07/21 111 lb 8 oz (50.6 kg)    Body mass index is 21.75 kg/m.  Performance status (ECOG): 2 - Symptomatic, <50% confined to bed  PHYSICAL EXAM:  Physical Exam Vitals and nursing note reviewed.  Constitutional:      General: She is not in acute distress.    Appearance: Normal appearance.  HENT:     Head: Normocephalic and atraumatic.     Mouth/Throat:     Mouth: Mucous membranes are moist.     Pharynx: Oropharynx is clear. No oropharyngeal exudate or posterior oropharyngeal erythema.  Eyes:     General: No scleral icterus.    Extraocular Movements: Extraocular movements intact.     Conjunctiva/sclera: Conjunctivae normal.     Pupils: Pupils are equal, round, and reactive to light.  Cardiovascular:     Rate and Rhythm: Normal rate and regular rhythm.     Heart sounds: Normal heart sounds. No murmur heard.    No friction rub. No gallop.  Pulmonary:     Effort: Pulmonary effort is normal.     Breath sounds: Normal breath sounds. No wheezing, rhonchi or rales.  Abdominal:     General: There is no distension.     Palpations: Abdomen is soft. There is no hepatomegaly, splenomegaly or mass.     Tenderness: There is no abdominal tenderness.   Musculoskeletal:        General: Swelling (Mild lymphedema of the left upper extremity) present. Normal range of motion.     Cervical back: Normal range of motion and neck supple. No tenderness.     Right lower leg: No edema.     Left lower leg: No edema.  Lymphadenopathy:  Cervical: No cervical adenopathy.     Upper Body:     Right upper body: Supraclavicular adenopathy (Several small 1 cm or less) present. No axillary adenopathy.     Left upper body: Supraclavicular adenopathy (Several small 1 cm or less) present. No axillary adenopathy.     Lower Body: No right inguinal adenopathy. No left inguinal adenopathy.  Skin:    General: Skin is warm and dry.     Coloration: Skin is not jaundiced.     Findings: Erythema (Persistent area of erythema in the left upper chest wall she is not raised, no other concerning areas) present. No rash.  Neurological:     Mental Status: She is alert and oriented to person, place, and time.     Cranial Nerves: No cranial nerve deficit.     Sensory: Sensory deficit (Decreased sensation of the left arm) present.     Motor: Weakness (Weakness of the left arm and hand with limited grip) present.  Psychiatric:        Mood and Affect: Mood normal.        Behavior: Behavior normal.        Thought Content: Thought content normal.     LABS:      Latest Ref Rng & Units 10/28/2021   12:00 AM 10/07/2021   12:00 AM 09/14/2021   12:00 AM  CBC  WBC  8.5     6.5     8.4      Hemoglobin 12.0 - 16.0 36.7     12.0     11.7      Hematocrit 36 - 46 192     36     36      Platelets 150 - 400 K/uL  180     181         This result is from an external source.      Latest Ref Rng & Units 10/28/2021   12:00 AM 10/07/2021   12:00 AM 09/14/2021   12:00 AM  CMP  BUN 4 - _0 Creatinine 0.5 - 1.1 0.6     0.6     0.5      Sodium 137 - 147 138     138     138      Potassium 3.5 - 5.1 mEq/L 4.8     4.2     4.3      Chloride 99 - 108 105     106     106       CO2 13 - _1 Calcium 8.7 - 10.7 8.7     8.8     8.7      Alkaline Phos 25 - 125 68     70     81      AST 13 - 35 43     39     45      ALT 7 - 35 U/L _2 This result is from an external source.     Lab Results  Component Value Date   CEA1 1.8 06/03/2021   CEA 0.5 11/13/2012   /  CEA  Date Value Ref Range Status  06/03/2021 1.8 0.0 -  4.7 ng/mL Final    Comment:    (NOTE)                             Nonsmokers          <3.9                             Smokers             <5.6 [employer] Electrochemiluminescence Immunoassay (ECLIA) Values obtained with different assay methods or kits cannot be used interchangeably.  Results cannot be interpreted as absolute evidence of the presence or absence of malignant disease. Performed At: [facility] [place], [place] [id number] Rush Farmer MD EV:[id number]   11/13/2012 0.5 0.0 - 5.0 ng/mL Final   No results found for: "PSA1" No results found for: "CAN199" No results found for: "CAN125"  No results found for: "TOTALPROTELP", "ALBUMINELP", "A1GS", "A2GS", "BETS", "BETA2SER", "GAMS", "MSPIKE", "SPEI" No results found for: "TIBC", "FERRITIN", "IRONPCTSAT" Lab Results  Component Value Date   LDH 186 11/12/2011   LDH 187 09/08/2010   LDH 171 09/09/2009    STUDIES:  No results found.    HISTORY:   Past Medical History:  Diagnosis Date   Arthritis    shoulder   Breast cancer ([place])    Cancer ([place])    left breast ca   Colon cancer ([place])    [name], stage II 11/12/2011   Sigmoid lesion 5.9 cm  40 nodes negative but focus of cancer in a diverticum   Pre-op CEA 5.2 with lab normal up to 2.5 resected 09/21/05   Xeloda adjuvant chemotherapy   Hypertension     Past Surgical History:  Procedure Laterality Date   COLONOSCOPY     HEMICOLECTOMY  2007   MASTECTOMY WITH AXILLARY LYMPH NODE DISSECTION Left 03/14/2019   Procedure: LEFT MASTECTOMY  WITH TARGETED LYMPH NODE DISSECTION;  Surgeon: Jovita Kussmaul, MD;  Location: [facility];  Service: General;  Laterality: Left;   SENTINEL NODE BIOPSY Left 03/14/2019   Procedure: Left Axillary Sentinel Lymph  Node Biopsy;  Surgeon: Jovita Kussmaul, MD;  Location: [facility] OR;  Service: General;  Laterality: Left;   TONSILLECTOMY     WISDOM TOOTH EXTRACTION      Family History  Problem Relation Age of Onset   Cancer Maternal Aunt        colon cancer     Social History:  reports that she has never smoked. She has never used smokeless tobacco. She reports that she does not drink alcohol and does not use drugs.The patient is accompanied by her son, Jasmin Lewis, today.  Allergies: No Known Allergies  Current Medications: Current Outpatient Medications  Medication Sig Dispense Refill   dexamethasone (DECADRON) 4 MG tablet Take 5 tabs at the night before and 5 tab the morning of chemotherapy, every 3 weeks, by mouth (Patient not taking: Reported on 07/15/2021) 60 tablet 0   fish oil-omega-3 fatty acids 1000 MG capsule Take 1 g by mouth daily.     magnesium citrate SOLN Take 0.5 Bottles by mouth as needed for severe constipation.     NON FORMULARY Magic Mouth Wash 3 parts Maalox 2 parts Benadryl 1 part Viscous Lidocaine Disp: 6oz Instructions: 77m orally swish and swallow every 3-4 hours  (Called into [facility] 07/06/21     ondansetron (ZOFRAN) 4 MG  tablet Take 1 tablet (4 mg total) by mouth every 8 (eight) hours as needed for nausea or vomiting. 20 tablet 2   oxyCODONE (OXY IR/ROXICODONE) 5 MG immediate release tablet Take 1 tablet (5 mg total) by mouth every 4 (four) hours as needed. 100 tablet 0   polyethylene glycol (MIRALAX / GLYCOLAX) 17 g packet Take 17 g by mouth as needed. constipation     senna (SENOKOT) 8.6 MG TABS tablet Take 1 tablet by mouth 2 (two) times daily.     No current facility-administered medications for this visit.

## 2021-10-22 NOTE — Assessment & Plan Note (Signed)
Stage IIB (T2c N1 M0) HER2 receptor positive, ER/PR negative, left breast cancer diagnosed in June 2020.  She was treated with neoadjuvant Kadcyla/Perjeta followed by left mastectomy.  She had a complete response in breast and nodes. She had only received two doses of adjuvant trastuzumab before discontinuing in December 2020.

## 2021-10-28 ENCOUNTER — Other Ambulatory Visit: Payer: Self-pay

## 2021-10-28 ENCOUNTER — Inpatient Hospital Stay: Payer: Medicare Other

## 2021-10-28 ENCOUNTER — Encounter: Payer: Self-pay | Admitting: Hematology and Oncology

## 2021-10-28 ENCOUNTER — Inpatient Hospital Stay (INDEPENDENT_AMBULATORY_CARE_PROVIDER_SITE_OTHER): Payer: Medicare Other | Admitting: Hematology and Oncology

## 2021-10-28 ENCOUNTER — Telehealth: Payer: Self-pay | Admitting: Hematology and Oncology

## 2021-10-28 DIAGNOSIS — C50412 Malignant neoplasm of upper-outer quadrant of left female breast: Secondary | ICD-10-CM

## 2021-10-28 DIAGNOSIS — C50912 Malignant neoplasm of unspecified site of left female breast: Secondary | ICD-10-CM | POA: Diagnosis not present

## 2021-10-28 DIAGNOSIS — Z171 Estrogen receptor negative status [ER-]: Secondary | ICD-10-CM

## 2021-10-28 DIAGNOSIS — C787 Secondary malignant neoplasm of liver and intrahepatic bile duct: Secondary | ICD-10-CM

## 2021-10-28 DIAGNOSIS — C7951 Secondary malignant neoplasm of bone: Secondary | ICD-10-CM | POA: Diagnosis not present

## 2021-10-28 DIAGNOSIS — C7989 Secondary malignant neoplasm of other specified sites: Secondary | ICD-10-CM

## 2021-10-28 LAB — BASIC METABOLIC PANEL
BUN: 17 (ref 4–21)
CO2: 26 — AB (ref 13–22)
Chloride: 105 (ref 99–108)
Creatinine: 0.6 (ref 0.5–1.1)
Glucose: 93
Potassium: 4.8 mEq/L (ref 3.5–5.1)
Sodium: 138 (ref 137–147)

## 2021-10-28 LAB — COMPREHENSIVE METABOLIC PANEL
Albumin: 3.9 (ref 3.5–5.0)
Calcium: 8.7 (ref 8.7–10.7)

## 2021-10-28 LAB — CBC AND DIFFERENTIAL
HCT: 192 — AB (ref 36–46)
Hemoglobin: 36.7 — AB (ref 12.0–16.0)
Neutrophils Absolute: 5.61
WBC: 8.5

## 2021-10-28 LAB — CBC
MCV: 98 (ref 81–99)
RBC: 12.2 — AB (ref 3.87–5.11)

## 2021-10-28 LAB — HEPATIC FUNCTION PANEL
ALT: 22 U/L (ref 7–35)
AST: 43 — AB (ref 13–35)
Alkaline Phosphatase: 68 (ref 25–125)
Bilirubin, Total: 0.5

## 2021-10-28 MED FILL — Trastuzumab-dkst For IV Soln 150 MG: INTRAVENOUS | Qty: 14.3 | Status: AC

## 2021-10-28 MED FILL — Zoledronic Acid Inj Conc For IV Infusion 4 MG/5ML: INTRAVENOUS | Qty: 3.75 | Status: AC

## 2021-10-28 MED FILL — Pertuzumab Soln for IV Infusion 420 MG/14ML (30 MG/ML): INTRAVENOUS | Qty: 14 | Status: AC

## 2021-10-28 MED FILL — Dexamethasone Sodium Phosphate Inj 100 MG/10ML: INTRAMUSCULAR | Qty: 1 | Status: AC

## 2021-10-28 MED FILL — Docetaxel Soln for IV Infusion 160 MG/16ML: INTRAVENOUS | Qty: 9 | Status: AC

## 2021-10-28 NOTE — Telephone Encounter (Signed)
Per 10/28/21 los next appt scheduled and confirmed with patient 

## 2021-10-29 ENCOUNTER — Inpatient Hospital Stay: Payer: Medicare Other

## 2021-10-29 VITALS — BP 152/68 | HR 70 | Temp 98.2°F | Resp 18 | Wt 112.2 lb

## 2021-10-29 DIAGNOSIS — Z5112 Encounter for antineoplastic immunotherapy: Secondary | ICD-10-CM | POA: Diagnosis not present

## 2021-10-29 DIAGNOSIS — C50412 Malignant neoplasm of upper-outer quadrant of left female breast: Secondary | ICD-10-CM

## 2021-10-29 DIAGNOSIS — C787 Secondary malignant neoplasm of liver and intrahepatic bile duct: Secondary | ICD-10-CM

## 2021-10-29 DIAGNOSIS — C7951 Secondary malignant neoplasm of bone: Secondary | ICD-10-CM

## 2021-10-29 MED ORDER — TRASTUZUMAB-DKST CHEMO 150 MG IV SOLR
300.0000 mg | Freq: Once | INTRAVENOUS | Status: AC
  Administered 2021-10-29: 300 mg via INTRAVENOUS
  Filled 2021-10-29: qty 14.29

## 2021-10-29 MED ORDER — ZOLEDRONIC ACID 4 MG/5ML IV CONC
3.0000 mg | Freq: Once | INTRAVENOUS | Status: AC
  Administered 2021-10-29: 3 mg via INTRAVENOUS
  Filled 2021-10-29: qty 3.75

## 2021-10-29 MED ORDER — SODIUM CHLORIDE 0.9 % IV SOLN
420.0000 mg | Freq: Once | INTRAVENOUS | Status: AC
  Administered 2021-10-29: 420 mg via INTRAVENOUS
  Filled 2021-10-29: qty 14

## 2021-10-29 MED ORDER — DIPHENHYDRAMINE HCL 25 MG PO CAPS
25.0000 mg | ORAL_CAPSULE | Freq: Once | ORAL | Status: AC
  Administered 2021-10-29: 25 mg via ORAL
  Filled 2021-10-29: qty 1

## 2021-10-29 MED ORDER — SODIUM CHLORIDE 0.9 % IV SOLN: Freq: Once | INTRAVENOUS | Status: AC

## 2021-10-29 MED ORDER — SODIUM CHLORIDE 0.9 % IV SOLN
60.0000 mg/m2 | Freq: Once | INTRAVENOUS | Status: AC
  Administered 2021-10-29: 90 mg via INTRAVENOUS
  Filled 2021-10-29: qty 9

## 2021-10-29 MED ORDER — SODIUM CHLORIDE 0.9 % IV SOLN
10.0000 mg | Freq: Once | INTRAVENOUS | Status: AC
  Administered 2021-10-29: 10 mg via INTRAVENOUS
  Filled 2021-10-29: qty 10

## 2021-10-29 MED ORDER — ACETAMINOPHEN 325 MG PO TABS
650.0000 mg | ORAL_TABLET | Freq: Once | ORAL | Status: AC
  Administered 2021-10-29: 650 mg via ORAL
  Filled 2021-10-29: qty 2

## 2021-10-30 ENCOUNTER — Inpatient Hospital Stay: Payer: Medicare Other

## 2021-10-30 VITALS — BP 178/87 | HR 87 | Temp 98.2°F | Resp 18 | Ht 60.25 in | Wt 111.8 lb

## 2021-10-30 DIAGNOSIS — C7951 Secondary malignant neoplasm of bone: Secondary | ICD-10-CM

## 2021-10-30 DIAGNOSIS — Z5112 Encounter for antineoplastic immunotherapy: Secondary | ICD-10-CM | POA: Diagnosis not present

## 2021-10-30 DIAGNOSIS — C50412 Malignant neoplasm of upper-outer quadrant of left female breast: Secondary | ICD-10-CM

## 2021-10-30 DIAGNOSIS — C787 Secondary malignant neoplasm of liver and intrahepatic bile duct: Secondary | ICD-10-CM

## 2021-10-30 MED ORDER — PEGFILGRASTIM-CBQV 6 MG/0.6ML ~~LOC~~ SOSY
6.0000 mg | PREFILLED_SYRINGE | Freq: Once | SUBCUTANEOUS | Status: AC
  Administered 2021-10-30: 6 mg via SUBCUTANEOUS
  Filled 2021-10-30: qty 0.6

## 2021-11-02 ENCOUNTER — Encounter: Payer: Self-pay | Admitting: Oncology

## 2021-11-06 ENCOUNTER — Other Ambulatory Visit: Payer: Self-pay | Admitting: Hematology and Oncology

## 2021-11-06 ENCOUNTER — Telehealth: Payer: Self-pay

## 2021-11-06 DIAGNOSIS — C7951 Secondary malignant neoplasm of bone: Secondary | ICD-10-CM

## 2021-11-06 DIAGNOSIS — R29898 Other symptoms and signs involving the musculoskeletal system: Secondary | ICD-10-CM

## 2021-11-06 NOTE — Telephone Encounter (Addendum)
Jasmin Lewis, @ [phone] - I called [name] back and got VM again. There is no other phone number on the chart and I did not write down the number the call came in on. We talked about MRI cervical spine at her visit and I wanted to confer with Dr. Bishop Dublin before ordering. Dr. Bishop Dublin recommended we proceed. If she calls back will you see what the question is? Thanks  [name], [place] @ [phone] - Thanks, she talked to [place] and cancelled the MRI

## 2021-11-06 NOTE — Progress Notes (Unsigned)
Spoke with Dr. Hinton Rao regarding imaging of the cervical spine due to the patient's persistent weakness and numbness of the left arm.  She is in agreement with this.  Left a message for the patient's son.  MRI cervical spine ordered.

## 2021-11-18 ENCOUNTER — Other Ambulatory Visit: Payer: Self-pay | Admitting: Oncology

## 2021-11-18 ENCOUNTER — Encounter: Payer: Self-pay | Admitting: Oncology

## 2021-11-18 ENCOUNTER — Inpatient Hospital Stay: Payer: Medicare Other

## 2021-11-18 ENCOUNTER — Inpatient Hospital Stay: Payer: Medicare Other | Attending: Oncology | Admitting: Oncology

## 2021-11-18 VITALS — BP 138/61 | HR 78 | Temp 97.7°F | Resp 20 | Ht 60.25 in | Wt 111.1 lb

## 2021-11-18 DIAGNOSIS — C787 Secondary malignant neoplasm of liver and intrahepatic bile duct: Secondary | ICD-10-CM | POA: Diagnosis not present

## 2021-11-18 DIAGNOSIS — C7951 Secondary malignant neoplasm of bone: Secondary | ICD-10-CM | POA: Diagnosis not present

## 2021-11-18 DIAGNOSIS — C50412 Malignant neoplasm of upper-outer quadrant of left female breast: Secondary | ICD-10-CM | POA: Diagnosis not present

## 2021-11-18 DIAGNOSIS — Z9012 Acquired absence of left breast and nipple: Secondary | ICD-10-CM | POA: Insufficient documentation

## 2021-11-18 DIAGNOSIS — Z5112 Encounter for antineoplastic immunotherapy: Secondary | ICD-10-CM | POA: Insufficient documentation

## 2021-11-18 DIAGNOSIS — I10 Essential (primary) hypertension: Secondary | ICD-10-CM | POA: Insufficient documentation

## 2021-11-18 DIAGNOSIS — C7989 Secondary malignant neoplasm of other specified sites: Secondary | ICD-10-CM

## 2021-11-18 DIAGNOSIS — C50912 Malignant neoplasm of unspecified site of left female breast: Secondary | ICD-10-CM | POA: Diagnosis not present

## 2021-11-18 DIAGNOSIS — Z853 Personal history of malignant neoplasm of breast: Secondary | ICD-10-CM | POA: Insufficient documentation

## 2021-11-18 DIAGNOSIS — Z171 Estrogen receptor negative status [ER-]: Secondary | ICD-10-CM

## 2021-11-18 DIAGNOSIS — Z9221 Personal history of antineoplastic chemotherapy: Secondary | ICD-10-CM | POA: Insufficient documentation

## 2021-11-18 DIAGNOSIS — C779 Secondary and unspecified malignant neoplasm of lymph node, unspecified: Secondary | ICD-10-CM | POA: Insufficient documentation

## 2021-11-18 DIAGNOSIS — Z5111 Encounter for antineoplastic chemotherapy: Secondary | ICD-10-CM | POA: Insufficient documentation

## 2021-11-18 DIAGNOSIS — M542 Cervicalgia: Secondary | ICD-10-CM

## 2021-11-18 DIAGNOSIS — Z85038 Personal history of other malignant neoplasm of large intestine: Secondary | ICD-10-CM | POA: Insufficient documentation

## 2021-11-18 DIAGNOSIS — Z5189 Encounter for other specified aftercare: Secondary | ICD-10-CM | POA: Insufficient documentation

## 2021-11-18 DIAGNOSIS — I890 Lymphedema, not elsewhere classified: Secondary | ICD-10-CM | POA: Insufficient documentation

## 2021-11-18 LAB — BASIC METABOLIC PANEL
BUN: 19 (ref 4–21)
CO2: 22 (ref 13–22)
Chloride: 106 (ref 99–108)
Creatinine: 0.7 (ref 0.5–1.1)
Glucose: 96
Potassium: 4.7 mEq/L (ref 3.5–5.1)
Sodium: 135 — AB (ref 137–147)

## 2021-11-18 LAB — HEPATIC FUNCTION PANEL
ALT: 20 U/L (ref 7–35)
AST: 38 — AB (ref 13–35)
Alkaline Phosphatase: 82 (ref 25–125)
Bilirubin, Total: 0.6

## 2021-11-18 LAB — CBC AND DIFFERENTIAL
HCT: 38 (ref 36–46)
Hemoglobin: 12.7 (ref 12.0–16.0)
Neutrophils Absolute: 7.24
Platelets: 198 10*3/uL (ref 150–400)
WBC: 10.2

## 2021-11-18 LAB — COMPREHENSIVE METABOLIC PANEL
Albumin: 3.8 (ref 3.5–5.0)
Calcium: 8.9 (ref 8.7–10.7)

## 2021-11-18 LAB — CBC: RBC: 3.88 (ref 3.87–5.11)

## 2021-11-18 MED FILL — Trastuzumab-dkst For IV Soln 150 MG: INTRAVENOUS | Qty: 14.3 | Status: AC

## 2021-11-18 MED FILL — Dexamethasone Sodium Phosphate Inj 100 MG/10ML: INTRAMUSCULAR | Qty: 1 | Status: AC

## 2021-11-18 MED FILL — Pertuzumab Soln for IV Infusion 420 MG/14ML (30 MG/ML): INTRAVENOUS | Qty: 14 | Status: AC

## 2021-11-18 MED FILL — Docetaxel Soln for IV Infusion 160 MG/16ML: INTRAVENOUS | Qty: 9 | Status: AC

## 2021-11-18 NOTE — Progress Notes (Signed)
[facility]  [address] [place],  [place]  [address] [phone]  Clinic Day: 11/18/21  Referring physician: [name], MD  ASSESSMENT & PLAN:   Stage IIB (T2c N1 M0) HER2 receptor positive left breast cancer, diagnosed in June 2020. This was ER/PR negative. This was treated with neoadjuvant Kadcyla/Perjeta and left mastectomy, with a complete response in breast and nodes. She had only received two doses of adjuvant Herceptin before being discontinued in December 2020.  History of stage II colon cancer, diagnosed in May 2007. This was treated with surgical resection and adjuvant chemotherapy with Xeloda. Colonoscopy from 2013 revealed 2 polyps which were resected and chronic diverticulosis.  Liver metastases discovered 04/17/21.  Numerous heterogeneously enhancing, internally necrotic lesions in the right hepatic lobe, consistent with metastatic disease. Largest lesion measures up to 6.7 cm and there are at least 6 lesions. We now have biopsy proven HER2 positive recurrent breast cancer with ER/PR negative. She is receiving THP and is tolerating it without significant difficulty. She had improvement in her liver function tests after 1 cycle, and they are now normal.  CT scan from May shows excellent response with significant decrease in the size of her liver lesions.  Bone metastases discovered 04/17/21. Enhancing osseous lesions are present in the L2 vertebral body, both iliac bones, and in the sacrum, highly concerning for osseous metastases. She received her 1st dose of zoledronic acid on January 16th. Her widespread bone metastases show an interval increase in sclerosis of previously lytic lesions consistent with treatment response.  Left supraclavicular lymph node measuring 2-3 cm. Also involvement of subpectoral node and chest wall skin. This was rapidly progressive despite receiving  trastuzumab.  We added in Perjeta and Taxotere to her current  regimen and this has been very effective.  The CT scan from May also shows good response of her adenopathy with resolution of the previous bulky left axillary and subpectoral adenopathy.  Lymphedema of the left upper extremity.  It is very mild at this time  Hypercalcemia, now resolved.  Numbness and paresthesias of the left hand which I feel is related to her spine metastasis involvement at T5.  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  I cannot rule out carpal tunnel syndrome.  We referred her to physical therapy and hand specialist but she feels she has had very little benefit and so we will stop that.   She has been on THP since mid February and is clearly responding well by CT scan and examination. She will be due for repeat scans in August.  She is still very anxious and needs reassurance.  Her main concern is the disability that has developed of her left upper extremity.  We could also consult a neurologist for further evaluation of the etiology of this.  She has tolerated this regimen without much difficulty and we can clearly see a resolution of the skin lesions of the left mastectomy and the left axillary adenopathy, and normalization of her liver function tests.  We will see her back in 3 weeks with CBC, CMP and CT scans of chest, abdomen and pelvis prior to her next cycle of therapy. I will also schedule an MRI of the cervical spine and brachial plexus. She will proceed with cycle #8 tomorrow and have her injection on Friday. She and her son understand and agree with this plan of care. I have answered  their questions and she knows to call with any concerns.   I provided 20 minutes of face-to-face time during this this encounter and > 50% was spent counseling as documented under my assessment and plan.    [name], MD [facility] [address] [place]  [place] [address] Dept: [phone] Dept Fax: [phone]     CBC      This external order was created through the Results Console.   Basic metabolic panel      This external order was created through the Results Console.   Comprehensive metabolic panel      This external order was created through the Results Console.   Hepatic function panel      This external order was created through the Results Console.   CBC      This order was created through External Result Entry        CHIEF COMPLAINT:  CC: Metastatic HER2 receptor positive breast cancer   Current Treatment: Docetaxel/trastuzumab/pertuzumab every 3 weeks   HISTORY OF PRESENT ILLNESS:  Jasmin Lewis is a 88 y.o. female who we began seeing in January for transfer of care for recurrent stage IIB left breast cancer. She was originally diagnosed in June 2020 when she was able to palpate a mass of the left breast. Mammogram and ultrasound from May 2020 revealed a 2.9 cm irregular mass in the upper outer left breast as well as two adjacent abnormal left axillary lymph nodes. 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 Left axillary lymph node biopsy was positive for invasive ductal carcinoma. HER2 was positive 3+. Estrogen and progesterone receptors were both negative at 0%, and Ki67 was 70%. Breast MRI from July revealed a 3.3 cm mass of the lateral portion of the left breast as well as significant non mass enhancement surrounding this mass and extending anteriorly into the nipple base, with largest diameter in the anterior to posterior axis, measuring 7.2 centimeters. Three enlarged left axillary lymph nodes. Right breast was negative.  PET imaging and July 2020 revealed no evidence of additional metastatic involvement. She received neoadjuvant Kadcyla/Perjeta for 6 cycles. Repeat breast MRI and October 2020 revealed complete resolution of the enhancing mass and associated non mass enhancement within the  left breast. Previously identified left axillary lymphadenopathy was no longer identified. She underwent left mastectomy and targeted lymph node dissection in November 2020.  Pathology revealed a complete response with no residual disease.. Three sentinel lymph nodes were negative for malignancy (0/3). She continued maintenance Herceptin injections for two more doses, but this was discontinued in December 2020, as she refused further ECHO scans. She has had chronic lymphedema of the left upper extremity since November 2022 after an accident, for which she wears a compression sleeve. However, her skin is reacting to the plastic cuff of the sleeve, even though she has tried turning it inside out. She has limited mobility of the right upper extremity due to prior incident of dislocated shoulder.   Of note, she has a history of stage II colon cancer, diagnosed in May 2007. Preop CEA was 5.2. She underwent surgical resection and surgical pathology revealed a 5.9 x 3.8 x 1.2 cm moderate to well-differentiated adenocarcinoma penetrating the muscular wall. Forty lymph nodes negative. No vascular or lymphatic invasion. Multiple diverticula with microabscess formation and inflammation. Carcinoma present in at least 1 diverticulum. She received adjuvant chemotherapy with Xeloda for 9 months. Colonoscopy from August  2013 revealed 2 polyps, which were resected and chronic diverticulosis.   She presented to [facility] in December 2022 due to constipation and back pain with spasms. CT abdomen/pelvis revealed new lytic lesion involving the L2 vertebral body with minimal cortical breakthrough superiorly, but with preservation of vertebral body height, concerning for metastatic disease. There were several indeterminate hepatic lesions. In addition to the lesions, there is an additional subtle 1.5 cm hypodensity in the hepatic dome. MRI abdomen in January revealed numerous heterogeneously enhancing, internally necrotic lesions in  the right hepatic lobe, highly concerning for metastatic disease.  The largest lesion measures up to 6.7 cm in hepatic segment VI. Enhancing osseous lesions in the L2 vertebral body, both iliac bones, and in the sacrum, highly concerning for osseous metastases were also noted.  Further evaluation with PET scan, MRI brain and spine, as well as echocardiogram were recommended. We also referred her to Dr. Lilia Pro for node biopsy and port placement. When he saw her, he found a nodule in the upper left chest wall which was suspicious and biopsied this rather than the left supraclavicular node. Pathology revealed carcinoma, 5 mm nodule, involving dermis, subcutaneous tissue and lymphovascular space. CK7 positive, estrogen staining is mostly negative, but there was some faint positivity. HER2 was positive 3+. Estrogen and progesterone receptors were negative. Ki67 was 70%.  This was compatible with metastatic breast carcinoma from her previous cancer.   She was initially treated with single agent trastuzumab in January, but had progressive skin metastasis and palpable adenopathy, so docetaxel and pertuzumab were added in February.  Repeat imaging in May revealed good response to treatment.  She has completed 6 cycles of docetaxel/trastuzumab/pertuzumab.         Oncology History Overview Note   Cancer Staging Malignant neoplasm of upper-outer quadrant of left breast in female, estrogen receptor negative ([place]) Staging form: Breast, AJCC 8th Edition - Clinical stage from 10/11/2018: Stage IIB (cT2, cN1, cM0, G3, ER-, PR-, HER2+) - Signed by Truitt Merle, MD on 11/02/2018 - Clinical: No stage assigned - Unsigned      History of colon cancer, stage II   09/2005 Initial Diagnosis     stage II adenocarcinoma of the sigmoid colon diagnosed in May 2007     09/21/2005 Surgery     She underwent surgical resection on 09/21/2005. Findings were a 5.9 x 3.8 x 1.2 cm moderate to well-differentiated adenocarcinoma penetrating the  muscular wall. Forty lymph nodes negative. No vascular or lymphatic invasion. Multiple diverticula with microabscess formation and inflammation. Carcinoma present in at least 1 diverticulum. Preop CEA 5.2 with lab normal range 0 to 2.5. Preop CT scan with no obvious additional pathology.      2007 -  Chemotherapy     She received oral Xeloda chemotherapy as an adjuvant. She declined treatment on a clinical trial.       11/12/2011 Initial Diagnosis     [name], stage II     12/09/2011 Procedure     Followup colonoscopy done on 12/09/2011. She was found to have 2 polyps which were removed. Chronic diverticulosis.        Malignant neoplasm of upper-outer quadrant of left breast in female, estrogen receptor negative ([place])   09/26/2018 Mammogram     Mammogram/US of left breast 09/26/18 IMPRESSION:  1. 2.9cm irregular mass in the upper outer left breast corresponds to the palpable abnormality. This is highly suspicious for breast carcinoma.  2. Two adjacent abnormal left axillary  Lymph nodes  suspicious for metastatic adenopathy. There is a third borderline abnormal left axillary LN with a cortex thickened to 55m.  3. Benign right breast cyst. No evidence of right breast malignancy.      10/11/2018 Cancer Staging     Staging form: Breast, AJCC 8th Edition - Clinical stage from 10/11/2018: Stage IIB (cT2, cN1, cM0, G3, ER-, PR-, HER2+) - Signed by [name] MD on 11/02/2018     10/11/2018 Initial Biopsy     Diagnosis 10/11/18 1. Breast, left, needle core biopsy, upper outer left 2 o'clock - INVASIVE DUCTAL CARCINOMA. - DUCTAL CARCINOMA IN SITU. - LYMPHOVASCULAR INVASION IS IDENTIFIED. - SEE COMMENT. 2. Lymph node, needle/core biopsy, left axilla - INVASIVE DUCTAL CARCINOMA. - SEE COMMENT.     10/11/2018 Receptors her2     The tumor cells are POSITIVE for Her2 (3+). Estrogen Receptor: 0%, NEGATIVE Progesterone Receptor: 0%, NEGATIVE Proliferation Marker Ki67: 70%     11/02/2018 Initial  Diagnosis     Malignant neoplasm of upper-outer quadrant of left breast in female, estrogen receptor negative ([place]     11/15/2018 Breast MRI     MRI breast 11/15/18  IMPRESSION: 1. 3.3 centimeter mass in the LATERAL portion of the LEFT breast consistent with known malignancy. 2. There is significant non mass enhancement surrounding this mass and extending anteriorly into the nipple base, with largest diameter in the anterior to posterior axis, measuring 7.2 centimeters. 3. If the patient would consider breast conservation, additional MR guided core biopsies are recommended. Consider biopsy of the inferior and anterior extent of the non mass enhancement to document extent of disease. 4. Three enlarged LEFT axillary lymph nodes. 5. RIGHT breast is negative.     11/15/2018 PET scan     PET 11/15/18 IMPRESSION: Hypermetabolic left breast lesion compatible with known primary. Hypermetabolic left axillary lymph nodes are consistent with metastatic disease.   No evidence for additional hypermetabolic metastatic involvement in the neck, chest, abdomen, or pelvis.     11/17/2018 - 03/01/2019 Chemotherapy     Neo-adjuvant Kadcyla and perjeta q3weeks for 6 cycles starting 11/17/18. Stopped before surgery      11/29/2018 Pathology Results     Diagnosis 1. Breast, left, needle core biopsy, inferior anterior (cylinder clip) - INVASIVE DUCTAL CARCINOMA. - DUCTAL CARCINOMA IN SITU. - LYMPHOVASCULAR INVASION IS IDENTIFIED. - SEE COMMENT. 2. Breast, left, needle core biopsy, central posterior (barbell clip) - INVASIVE DUCTAL CARCINOMA. - LYMPHOVASCULAR INVASION IS IDENTIFIED.     02/12/2019 Breast MRI     IMPRESSION: 1. Complete resolution of previously identified enhancing mass and associated non mass enhancement within the left breast. This is consistent with excellent response to chemotherapy. No residual or suspicious findings are identified. 2. No MRI evidence of malignancy on the right. 3.  Previously identified left axillary lymphadenopathy not definitively seen on today's study. However, this may be due to decreased field-of-view compared to prior study.       03/14/2019 Cancer Staging     Staging form: Breast, AJCC 8th Edition - Pathologic stage from 03/14/2019: pT0, pN0, cM0, GX, ER: Unknown, PR: Unknown, HER2: Not Assessed - Signed by [name] MD on 03/28/2019     03/14/2019 Surgery     LEFT MASTECTOMY WITH TARGETED LYMPH NODE DISSECTION and Left Axillary Sentinel Lymph  Node Biopsy by Dr TMarlou Starks11/4/20      03/14/2019 Pathology Results     FINAL MICROSCOPIC DIAGNOSIS:    A. LYMPH NODE, LEFT, SENTINEL, BIOPSY:  - There is no  evidence of carcinoma in 1 of 1 lymph node (0/1).    B. BREAST, LEFT, MASTECTOMY:  - Benign breast parenchyma with treatment-related changes.  - There is no evidence of malignancy.  - See oncology table below.    C. LYMPH NODE, LEFT #1, SENTINEL, BIOPSY:  - There is no evidence of carcinoma in 1 of 1 lymph node (0/1).    D. LYMPH NODE, LEFT #2, SENTINEL, BIOPSY:  - There is no evidence of carcinoma in 1 of 1 lymph node (0/1).        04/04/2019 - 04/25/2019 Chemotherapy     Maintenance Herceptin injections every 3 weeks starting 04/03/19 to complete 1 year of treatment that was started in 11/2018. Stopped after 2nd dose as she will not be repeating Echos.      06/03/2021 - 06/24/2021 Chemotherapy     Patient is on Treatment Plan : BREAST Trastuzumab q21d X 11 Cycles      06/25/2021 -  Chemotherapy     Patient is on Treatment Plan : BREAST Docetaxel + Trastuzumab + Pertuzumab (THP) q21d      09/14/2021 Imaging     CT chest/abdomen/pelvis   IMPRESSION:  1. Interval resolution of previously seen bulky left axillary and  subpectoral lymphadenopathy. No persistently enlarged lymph nodes.  2. Multiple liver lesions are significantly diminished in size.  3. Widespread osseous metastatic disease, with an interval increase  in sclerosis of several  previously lytic lesions.  4. Constellation of findings is consistent with treatment response  of metastatic disease  5. New, although age indeterminate pathologic wedge deformity of the  T6 vertebral body as well as an increased pathologic wedge deformity  of the L2 vertebral body.  6. Coronary artery disease.  Aortic Atherosclerosis (ICD10-I70.0).      Malignant neoplasm metastatic to liver ([place])   04/17/2021 Imaging     CT ABDOMEN AND PELVIS WITH CONTRAST: -New lytic lesion involving the L2 vertebral body (6:72, 2:26) with minimal cortical breakthrough superiorly, but with preservation of vertebral body height, concerning for metastatic disease.  -There are several indeterminate hepatic lesions as described above. In addition to the lesions described above, there is an additional subtle 1.5 cm hypodensity in the hepatic dome (2:7). Given clinical history and presence of a new L2 lesion, leading differential consideration is metastatic disease. Recommend further evaluation with MRI of the abdomen with and without contrast.     05/12/2021 Imaging     MRI ABDOMEN WITH AND WITHOUT CONTRAST: Numerous heterogeneously enhancing, internally necrotic lesions in the right hepatic lobe, highly concerning for metastatic disease. Largest lesion measures up to 6.7 cm in hepatic segment VI.   Enhancing osseous lesions in the L2 vertebral body, both iliac bones, and in the sacrum, highly concerning for osseous metastases.     05/15/2021 Initial Diagnosis     Liver metastases ([place])     05/27/2021 PET scan     1. Widespread recurrent/metastatic disease in this patient who is  status post bilateral mastectomy. Left chest wall recurrence with  left axillary/subpectoral, supraclavicular nodal metastasis.  2. Hepatic, left adrenal, and widespread osseous metastasis.  3. Incidental findings, including: Right nephrolithiasis. Tiny  hiatal hernia.      05/27/2021 Imaging     MRI LUMBAR AND THORACIC  SPINE: Thoracic spine:  Osseous metastatic disease involving each level. The most dramatic deposits are at T4 and T6 where there is extraosseous/epidural tumor. No cord compression but there is foraminal impingement on the left at T6-7 and  on the right at T3-4. Early dorsal epidural tumor at the level of T10 and T3.    Lumbar spine:  1. Widespread osseous metastatic disease with largest deposit  replacing the L2 body where there is a compression fracture with  mild height loss. Also at this level is early epidural tumor  extension likely affecting both L2-3 foramina.  2. 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      05/27/2021 Imaging     MRI HEAD WITH AND WITHOUT CONTRAST: Negative for metastatic disease to the brain or calvarium.     06/03/2021 - 06/24/2021 Chemotherapy     Patient is on Treatment Plan : BREAST Trastuzumab q21d X 11 Cycles      06/25/2021 -  Chemotherapy     Patient is on Treatment Plan : BREAST Docetaxel + Trastuzumab + Pertuzumab (THP) q21d      09/14/2021 Imaging     CT chest/abdomen/pelvis   IMPRESSION:  1. Interval resolution of previously seen bulky left axillary and  subpectoral lymphadenopathy. No persistently enlarged lymph nodes.  2. Multiple liver lesions are significantly diminished in size.  3. Widespread osseous metastatic disease, with an interval increase  in sclerosis of several previously lytic lesions.  4. Constellation of findings is consistent with treatment response  of metastatic disease  5. New, although age indeterminate pathologic wedge deformity of the  T6 vertebral body as well as an increased pathologic wedge deformity  of the L2 vertebral body.  6. Coronary artery disease.  Aortic Atherosclerosis (ICD10-I70.0).      Malignant neoplasm metastatic to bone ([place])   04/17/2021 Imaging     CT ABDOMEN AND PELVIS WITH CONTRAST: -New lytic lesion involving the L2 vertebral body (6:72, 2:26) with minimal cortical  breakthrough superiorly, but with preservation of vertebral body height, concerning for metastatic disease.  -There are several indeterminate hepatic lesions as described above. In addition to the lesions described above, there is an additional subtle 1.5 cm hypodensity in the hepatic dome (2:7). Given clinical history and presence of a new L2 lesion, leading differential consideration is metastatic disease. Recommend further evaluation with MRI of the abdomen with and without contrast.     05/12/2021 Imaging     MRI ABDOMEN WITH AND WITHOUT CONTRAST: Numerous heterogeneously enhancing, internally necrotic lesions in the right hepatic lobe, highly concerning for metastatic disease. Largest lesion measures up to 6.7 cm in hepatic segment VI.   Enhancing osseous lesions in the L2 vertebral body, both iliac bones, and in the sacrum, highly concerning for osseous metastases.     05/15/2021 Initial Diagnosis     Bone metastases ([place])     05/27/2021 PET scan     1. Widespread recurrent/metastatic disease in this patient who is  status post bilateral mastectomy. Left chest wall recurrence with  left axillary/subpectoral, supraclavicular nodal metastasis.  2. Hepatic, left adrenal, and widespread osseous metastasis.  3. Incidental findings, including: Right nephrolithiasis. Tiny  hiatal hernia.      05/27/2021 Imaging     MRI LUMBAR AND THORACIC SPINE: Thoracic spine:  Osseous metastatic disease involving each level. The most dramatic deposits are at T4 and T6 where there is extraosseous/epidural tumor. No cord compression but there is foraminal impingement on the left at T6-7 and on the right at T3-4. Early dorsal epidural tumor at the level of T10 and T3.    Lumbar spine:  1. Widespread osseous metastatic disease with largest deposit  replacing the L2 body where there is  a compression fracture with  mild height loss. Also at this level is early epidural tumor  extension likely affecting both L2-3  foramina.  2. 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      05/27/2021 Imaging     MRI HEAD WITH AND WITHOUT CONTRAST: Negative for metastatic disease to the brain or calvarium.     06/03/2021 - 06/24/2021 Chemotherapy     Patient is on Treatment Plan : BREAST Trastuzumab q21d X 11 Cycles      06/25/2021 -  Chemotherapy     Patient is on Treatment Plan : BREAST Docetaxel + Trastuzumab + Pertuzumab (THP) q21d      09/14/2021 Imaging     CT chest/abdomen/pelvis   IMPRESSION:  1. Interval resolution of previously seen bulky left axillary and  subpectoral lymphadenopathy. No persistently enlarged lymph nodes.  2. Multiple liver lesions are significantly diminished in size.  3. Widespread osseous metastatic disease, with an interval increase  in sclerosis of several previously lytic lesions.  4. Constellation of findings is consistent with treatment response  of metastatic disease  5. New, although age indeterminate pathologic wedge deformity of the  T6 vertebral body as well as an increased pathologic wedge deformity  of the L2 vertebral body.  6. Coronary artery disease.  Aortic Atherosclerosis (ICD10-I70.0).            INTERVAL HISTORY:  Jasmin Lewis is here today for repeat clinical assessment prior to her 8th cycle of palliative docetaxel/trastuzumab/pertuzumab.  She states she had more difficulty with the last cycle with excessive tearing and appetite changes.  She reports improved lymphedema of the left arm, but perhaps worsening numbness and weakness of the left hand.  She has seen a hand specialist, but did not feel she was getting any results.  She denies any changes in her skin.  She denies abdominal pain.  She denies fevers or chills. She denies pain. Her appetite varies. Her weight is stable .  Her son accompanies her and asks about a scan of her cervical spine due to the numbness and weakness of her left hand.  On CT imaging in May all of the bony  metastasis had increasing sclerosis consistent with treatment effect.     REVIEW OF SYSTEMS:  Review of Systems  Constitutional:  Negative for appetite change, chills, fatigue, fever and unexpected weight change.  HENT:   Negative for lump/mass, mouth sores and sore throat.   Respiratory:  Negative for cough and shortness of breath.   Cardiovascular:  Negative for chest pain and leg swelling.  Gastrointestinal:  Negative for abdominal pain, constipation, diarrhea, nausea and vomiting.  Endocrine: Negative for hot flashes.  Genitourinary:  Negative for difficulty urinating, dysuria, frequency and hematuria.   Musculoskeletal:  Negative for arthralgias, back pain and myalgias.  Skin:  Negative for rash.  Neurological:  Negative for dizziness and headaches.  Hematological:  Negative for adenopathy. Does not bruise/bleed easily.  Psychiatric/Behavioral:  Negative for depression and sleep disturbance. The patient is not nervous/anxious.       VITALS:  Blood pressure (!) 157/68, pulse 71, temperature 97.9 F (36.6 C), temperature source Oral, resp. rate 18, height 5' 0.25" (1.53 m), weight 112 lb 4.8 oz (50.9 kg), SpO2 96 %.     Wt Readings from Last 3 Encounters:  10/28/21 112 lb 4.8 oz (50.9 kg)  10/08/21 108 lb (49 kg)  10/07/21 111 lb 8 oz (50.6 kg)    Body mass index is 21.75 kg/m.  Performance status (ECOG): 2 - Symptomatic, <50% confined to bed   PHYSICAL EXAM:  Physical Exam Vitals and nursing note reviewed.  Constitutional:      General: She is not in acute distress.    Appearance: Normal appearance.  HENT:     Head: Normocephalic and atraumatic.     Mouth/Throat:     Mouth: Mucous membranes are moist.     Pharynx: Oropharynx is clear. No oropharyngeal exudate or posterior oropharyngeal erythema.  Eyes:     General: No scleral icterus.    Extraocular Movements: Extraocular movements intact.     Conjunctiva/sclera: Conjunctivae normal.     Pupils: Pupils are equal,  round, and reactive to light.  Cardiovascular:     Rate and Rhythm: Normal rate and regular rhythm.     Heart sounds: Normal heart sounds. No murmur heard.    No friction rub. No gallop.  Pulmonary:     Effort: Pulmonary effort is normal.     Breath sounds: Normal breath sounds. No wheezing, rhonchi or rales.  Abdominal:     General: There is no distension.     Palpations: Abdomen is soft. There is no hepatomegaly, splenomegaly or mass.     Tenderness: There is no abdominal tenderness.  Musculoskeletal:        General: Swelling (Mild lymphedema of the left upper extremity) present. Normal range of motion.     Cervical back: Normal range of motion and neck supple. No tenderness.     Right lower leg: No edema.     Left lower leg: No edema.  Lymphadenopathy:     Cervical: No cervical adenopathy.     Upper Body:     Right upper body: Supraclavicular adenopathy (Several small 1 cm or less) present. No axillary adenopathy.     Left upper body: Supraclavicular adenopathy (Several small 1 cm or less) present. No axillary adenopathy.     Lower Body: No right inguinal adenopathy. No left inguinal adenopathy.  Skin:    General: Skin is warm and dry.     Coloration: Skin is not jaundiced.     Findings: Erythema (Persistent area of erythema in the left upper chest wall she is not raised, no other concerning areas) present. No rash.  Neurological:     Mental Status: She is alert and oriented to person, place, and time.     Cranial Nerves: No cranial nerve deficit.     Sensory: Sensory deficit (Decreased sensation of the left arm) present.     Motor: Weakness (Weakness of the left arm and hand with limited grip) present.  Psychiatric:        Mood and Affect: Mood normal.        Behavior: Behavior normal.        Thought Content: Thought content normal.        LABS:        Latest Ref Rng & Units 10/28/2021   12:00 AM 10/07/2021   12:00 AM 09/14/2021   12:00 AM  CBC  WBC   8.5     6.5      8.4      Hemoglobin 12.0 - 16.0 36.7     12.0     11.7      Hematocrit 36 - 46 192     36     36      Platelets 150 - 400 K/uL   180     181  This result is from an external source.        Latest Ref Rng & Units 10/28/2021   12:00 AM 10/07/2021   12:00 AM 09/14/2021   12:00 AM  CMP  BUN 4 - 21 17     15     14       Creatinine 0.5 - 1.1 0.6     0.6     0.5      Sodium 137 - 147 138     138     138      Potassium 3.5 - 5.1 mEq/L 4.8     4.2     4.3      Chloride 99 - 108 105     106     106      CO2 13 - 22 26     25     25       Calcium 8.7 - 10.7 8.7     8.8     8.7      Alkaline Phos 25 - 125 68     70     81      AST 13 - 35 43     39     45      ALT 7 - 35 U/L 22     21     23          This result is from an external source.        Recent Labs       Lab Results  Component Value Date    CEA1 1.8 06/03/2021    CEA 0.5 11/13/2012     /  Last Labs         CEA  Date Value Ref Range Status  06/03/2021 1.8 0.0 - 4.7 ng/mL Final      Comment:      (NOTE)                             Nonsmokers          <3.9                             Smokers             <5.6 Roche [employer] Electrochemiluminescence Immunoassay (ECLIA) Values obtained with different assay methods or kits cannot be used interchangeably.  Results cannot be interpreted as absolute evidence of the presence or absence of malignant disease. Performed At: [facility] [place], [place] [id number] Rush Farmer MD [id number]:[id number]    11/13/2012 0.5 0.0 - 5.0 ng/mL Final      Recent Labs  No results found for: "PSA1"   Recent Labs  No results found for: "CAN199"   Recent Labs  No results found for: "CAN125"    Recent Labs  No results found for: "TOTALPROTELP", "ALBUMINELP", "A1GS", "A2GS", "BETS", "BETA2SER", "GAMS", "MSPIKE", "SPEI"   Recent Labs  No results found for: "TIBC", "FERRITIN", "IRONPCTSAT"   Recent Labs       Lab Results  Component Value Date    LDH 186  11/12/2011    LDH 187 09/08/2010    LDH 171 09/09/2009        STUDIES:  Imaging Results  No results found.        HISTORY:        Past Medical History:  Diagnosis Date  Arthritis      shoulder   Breast cancer ([place])     Cancer ([place])      left breast ca   Colon cancer ([place])     [name], stage II 11/12/2011    Sigmoid lesion 5.9 cm  40 nodes negative but focus of cancer in a diverticum   Pre-op CEA 5.2 with lab normal up to 2.5 resected 09/21/05   Xeloda adjuvant chemotherapy   Hypertension             Past Surgical History:  Procedure Laterality Date   COLONOSCOPY       HEMICOLECTOMY   2007   MASTECTOMY WITH AXILLARY LYMPH NODE DISSECTION Left 03/14/2019    Procedure: LEFT MASTECTOMY WITH TARGETED LYMPH NODE DISSECTION;  Surgeon: Jovita Kussmaul, MD;  Location: [facility];  Service: General;  Laterality: Left;   SENTINEL NODE BIOPSY Left 03/14/2019    Procedure: Left Axillary Sentinel Lymph  Node Biopsy;  Surgeon: Jovita Kussmaul, MD;  Location: [facility] OR;  Service: General;  Laterality: Left;   TONSILLECTOMY       WISDOM TOOTH EXTRACTION               Family History  Problem Relation Age of Onset   Cancer Maternal Aunt          colon cancer       Social History:  reports that she has never smoked. She has never used smokeless tobacco. She reports that she does not drink alcohol and does not use drugs.The patient is accompanied by her son, Jasmin Lewis, today.   Allergies: No Known Allergies   Current Medications:       Current Outpatient Medications  Medication Sig Dispense Refill   dexamethasone (DECADRON) 4 MG tablet Take 5 tabs at the night before and 5 tab the morning of chemotherapy, every 3 weeks, by mouth (Patient not taking: Reported on 07/15/2021) 60 tablet 0   fish oil-omega-3 fatty acids 1000 MG capsule Take 1 g by mouth daily.       magnesium citrate SOLN Take 0.5 Bottles by mouth as needed for severe constipation.       NON FORMULARY Magic Mouth Wash 3 parts  Maalox 2 parts Benadryl 1 part Viscous Lidocaine Disp: 6oz Instructions: 50m orally swish and swallow every 3-4 hours  (Called into [facility] 07/06/21       ondansetron (ZOFRAN) 4 MG tablet Take 1 tablet (4 mg total) by mouth every 8 (eight) hours as needed for nausea or vomiting. 20 tablet 2   oxyCODONE (OXY IR/ROXICODONE) 5 MG immediate release tablet Take 1 tablet (5 mg total) by mouth every 4 (four) hours as needed. 100 tablet 0   polyethylene glycol (MIRALAX / GLYCOLAX) 17 g packet Take 17 g by mouth as needed. constipation       senna (SENOKOT) 8.6 MG TABS tablet Take 1 tablet by mouth 2 (two) times daily.        No current facility-administered medications for this visit.

## 2021-11-19 ENCOUNTER — Inpatient Hospital Stay: Payer: Medicare Other

## 2021-11-19 ENCOUNTER — Telehealth: Payer: Self-pay

## 2021-11-19 VITALS — BP 134/74 | HR 88 | Temp 97.9°F | Resp 18 | Ht 60.25 in | Wt 112.0 lb

## 2021-11-19 DIAGNOSIS — Z853 Personal history of malignant neoplasm of breast: Secondary | ICD-10-CM | POA: Diagnosis not present

## 2021-11-19 DIAGNOSIS — I890 Lymphedema, not elsewhere classified: Secondary | ICD-10-CM | POA: Diagnosis not present

## 2021-11-19 DIAGNOSIS — C787 Secondary malignant neoplasm of liver and intrahepatic bile duct: Secondary | ICD-10-CM | POA: Diagnosis not present

## 2021-11-19 DIAGNOSIS — C50412 Malignant neoplasm of upper-outer quadrant of left female breast: Secondary | ICD-10-CM | POA: Diagnosis present

## 2021-11-19 DIAGNOSIS — Z9221 Personal history of antineoplastic chemotherapy: Secondary | ICD-10-CM | POA: Diagnosis not present

## 2021-11-19 DIAGNOSIS — Z9012 Acquired absence of left breast and nipple: Secondary | ICD-10-CM | POA: Diagnosis not present

## 2021-11-19 DIAGNOSIS — I10 Essential (primary) hypertension: Secondary | ICD-10-CM | POA: Diagnosis not present

## 2021-11-19 DIAGNOSIS — Z85038 Personal history of other malignant neoplasm of large intestine: Secondary | ICD-10-CM | POA: Diagnosis not present

## 2021-11-19 DIAGNOSIS — Z5111 Encounter for antineoplastic chemotherapy: Secondary | ICD-10-CM | POA: Diagnosis present

## 2021-11-19 DIAGNOSIS — Z5189 Encounter for other specified aftercare: Secondary | ICD-10-CM | POA: Diagnosis not present

## 2021-11-19 DIAGNOSIS — C779 Secondary and unspecified malignant neoplasm of lymph node, unspecified: Secondary | ICD-10-CM | POA: Diagnosis not present

## 2021-11-19 DIAGNOSIS — C7951 Secondary malignant neoplasm of bone: Secondary | ICD-10-CM | POA: Diagnosis not present

## 2021-11-19 DIAGNOSIS — Z171 Estrogen receptor negative status [ER-]: Secondary | ICD-10-CM

## 2021-11-19 DIAGNOSIS — Z5112 Encounter for antineoplastic immunotherapy: Secondary | ICD-10-CM | POA: Diagnosis not present

## 2021-11-19 MED ORDER — SODIUM CHLORIDE 0.9% FLUSH
10.0000 mL | INTRAVENOUS | Status: DC | PRN
  Administered 2021-11-19: 10 mL

## 2021-11-19 MED ORDER — SODIUM CHLORIDE 0.9 % IV SOLN
420.0000 mg | Freq: Once | INTRAVENOUS | Status: AC
  Administered 2021-11-19: 420 mg via INTRAVENOUS
  Filled 2021-11-19: qty 14

## 2021-11-19 MED ORDER — SODIUM CHLORIDE 0.9 % IV SOLN: Freq: Once | INTRAVENOUS | Status: AC

## 2021-11-19 MED ORDER — HEPARIN SOD (PORK) LOCK FLUSH 100 UNIT/ML IV SOLN
500.0000 [IU] | Freq: Once | INTRAVENOUS | Status: AC | PRN
  Administered 2021-11-19: 500 [IU]

## 2021-11-19 MED ORDER — SODIUM CHLORIDE 0.9 % IV SOLN
60.0000 mg/m2 | Freq: Once | INTRAVENOUS | Status: AC
  Administered 2021-11-19: 90 mg via INTRAVENOUS
  Filled 2021-11-19: qty 9

## 2021-11-19 MED ORDER — ACETAMINOPHEN 325 MG PO TABS
650.0000 mg | ORAL_TABLET | Freq: Once | ORAL | Status: AC
  Administered 2021-11-19: 650 mg via ORAL
  Filled 2021-11-19: qty 2

## 2021-11-19 MED ORDER — DIPHENHYDRAMINE HCL 25 MG PO CAPS
25.0000 mg | ORAL_CAPSULE | Freq: Once | ORAL | Status: AC
  Administered 2021-11-19: 25 mg via ORAL
  Filled 2021-11-19: qty 1

## 2021-11-19 MED ORDER — SODIUM CHLORIDE 0.9 % IV SOLN
10.0000 mg | Freq: Once | INTRAVENOUS | Status: AC
  Administered 2021-11-19: 10 mg via INTRAVENOUS
  Filled 2021-11-19: qty 10

## 2021-11-19 MED ORDER — TRASTUZUMAB-DKST CHEMO 150 MG IV SOLR
300.0000 mg | Freq: Once | INTRAVENOUS | Status: AC
  Administered 2021-11-19: 300 mg via INTRAVENOUS
  Filled 2021-11-19: qty 14.29

## 2021-11-19 NOTE — Patient Instructions (Signed)
Docetaxel injection What is this medication? DOCETAXEL (doe se TAX el) is a chemotherapy drug. It targets fast dividing cells, like cancer cells, and causes these cells to die. This medicine is used to treat many types of cancers like breast cancer, certain stomach cancers, head and neck cancer, lung cancer, and prostate cancer. This medicine may be used for other purposes; ask your health care provider or pharmacist if you have questions. COMMON BRAND NAME(S): Docefrez, Taxotere What should I tell my care team before I take this medication? They need to know if you have any of these conditions: infection (especially a virus infection such as chickenpox, cold sores, or herpes) liver disease low blood counts, like low white cell, platelet, or red cell counts an unusual or allergic reaction to docetaxel, polysorbate 80, other chemotherapy agents, other medicines, foods, dyes, or preservatives pregnant or trying to get pregnant breast-feeding How should I use this medication? This drug is given as an infusion into a vein. It is administered in a hospital or clinic by a specially trained health care professional. Talk to your pediatrician regarding the use of this medicine in children. Special care may be needed. Overdosage: If you think you have taken too much of this medicine contact a poison control center or emergency room at once. NOTE: This medicine is only for you. Do not share this medicine with others. What if I miss a dose? It is important not to miss your dose. Call your doctor or health care professional if you are unable to keep an appointment. What may interact with this medication? Do not take this medicine with any of the following medications: live virus vaccines This medicine may also interact with the following medications: aprepitant certain antibiotics like erythromycin or clarithromycin certain antivirals for HIV or hepatitis certain medicines for fungal infections like  fluconazole, itraconazole, ketoconazole, posaconazole, or voriconazole cimetidine ciprofloxacin conivaptan cyclosporine dronedarone fluvoxamine grapefruit juice imatinib verapamil This list may not describe all possible interactions. Give your health care provider a list of all the medicines, herbs, non-prescription drugs, or dietary supplements you use. Also tell them if you smoke, drink alcohol, or use illegal drugs. Some items may interact with your medicine. What should I watch for while using this medication? Your condition will be monitored carefully while you are receiving this medicine. You will need important blood work done while you are taking this medicine. Call your doctor or health care professional for advice if you get a fever, chills or sore throat, or other symptoms of a cold or flu. Do not treat yourself. This drug decreases your body's ability to fight infections. Try to avoid being around people who are sick. Some products may contain alcohol. Ask your health care professional if this medicine contains alcohol. Be sure to tell all health care professionals you are taking this medicine. Certain medicines, like metronidazole and disulfiram, can cause an unpleasant reaction when taken with alcohol. The reaction includes flushing, headache, nausea, vomiting, sweating, and increased thirst. The reaction can last from 30 minutes to several hours. You may get drowsy or dizzy. Do not drive, use machinery, or do anything that needs mental alertness until you know how this medicine affects you. Do not stand or sit up quickly, especially if you are an older patient. This reduces the risk of dizzy or fainting spells. Alcohol may interfere with the effect of this medicine. Talk to your health care professional about your risk of cancer. You may be more at risk for certain types  of cancer if you take this medicine. Do not become pregnant while taking this medicine or for 6 months after  stopping it. Women should inform their doctor if they wish to become pregnant or think they might be pregnant. There is a potential for serious side effects to an unborn child. Talk to your health care professional or pharmacist for more information. Do not breast-feed an infant while taking this medicine or for 1 week after stopping it. Males who get this medicine must use a condom during sex with females who can get pregnant. If you get a woman pregnant, the baby could have birth defects. The baby could die before they are born. You will need to continue wearing a condom for 3 months after stopping the medicine. Tell your health care provider right away if your partner becomes pregnant while you are taking this medicine. This may interfere with the ability to father a child. You should talk to your doctor or health care professional if you are concerned about your fertility. What side effects may I notice from receiving this medication? Side effects that you should report to your doctor or health care professional as soon as possible: allergic reactions like skin rash, itching or hives, swelling of the face, lips, or tongue blurred vision breathing problems changes in vision low blood counts - This drug may decrease the number of white blood cells, red blood cells and platelets. You may be at increased risk for infections and bleeding. nausea and vomiting pain, redness or irritation at site where injected pain, tingling, numbness in the hands or feet redness, blistering, peeling, or loosening of the skin, including inside the mouth signs of decreased platelets or bleeding - bruising, pinpoint red spots on the skin, black, tarry stools, nosebleeds signs of decreased red blood cells - unusually weak or tired, fainting spells, lightheadedness signs of infection - fever or chills, cough, sore throat, pain or difficulty passing urine swelling of the ankle, feet, hands Side effects that usually do not  require medical attention (report to your doctor or health care professional if they continue or are bothersome): constipation diarrhea fingernail or toenail changes hair loss loss of appetite mouth sores muscle pain This list may not describe all possible side effects. Call your doctor for medical advice about side effects. You may report side effects to FDA at 1-800-FDA-1088. Where should I keep my medication? This drug is given in a hospital or clinic and will not be stored at home. NOTE: This sheet is a summary. It may not cover all possible information. If you have questions about this medicine, talk to your doctor, pharmacist, or health care provider.  2023 Elsevier/Gold Standard (2021-03-27 00:00:00) Pertuzumab injection What is this medication? PERTUZUMAB (per TOOZ ue mab) is a monoclonal antibody. It is used to treat breast cancer. This medicine may be used for other purposes; ask your health care provider or pharmacist if you have questions. COMMON BRAND NAME(S): PERJETA What should I tell my care team before I take this medication? They need to know if you have any of these conditions: heart disease heart failure high blood pressure history of irregular heart beat recent or ongoing radiation therapy an unusual or allergic reaction to pertuzumab, other medicines, foods, dyes, or preservatives pregnant or trying to get pregnant breast-feeding How should I use this medication? This medicine is for infusion into a vein. It is given by a health care professional in a hospital or clinic setting. Talk to your pediatrician regarding the  use of this medicine in children. Special care may be needed. Overdosage: If you think you have taken too much of this medicine contact a poison control center or emergency room at once. NOTE: This medicine is only for you. Do not share this medicine with others. What if I miss a dose? It is important not to miss your dose. Call your doctor or  health care professional if you are unable to keep an appointment. What may interact with this medication? Interactions are not expected. Give your health care provider a list of all the medicines, herbs, non-prescription drugs, or dietary supplements you use. Also tell them if you smoke, drink alcohol, or use illegal drugs. Some items may interact with your medicine. This list may not describe all possible interactions. Give your health care provider a list of all the medicines, herbs, non-prescription drugs, or dietary supplements you use. Also tell them if you smoke, drink alcohol, or use illegal drugs. Some items may interact with your medicine. What should I watch for while using this medication? Your condition will be monitored carefully while you are receiving this medicine. Report any side effects. Continue your course of treatment even though you feel ill unless your doctor tells you to stop. Do not become pregnant while taking this medicine or for 7 months after stopping it. Women should inform their doctor if they wish to become pregnant or think they might be pregnant. Women of child-bearing potential will need to have a negative pregnancy test before starting this medicine. There is a potential for serious side effects to an unborn child. Talk to your health care professional or pharmacist for more information. Do not breast-feed an infant while taking this medicine or for 7 months after stopping it. Women must use effective birth control with this medicine. Call your doctor or health care professional for advice if you get a fever, chills or sore throat, or other symptoms of a cold or flu. Do not treat yourself. Try to avoid being around people who are sick. You may experience fever, chills, and headache during the infusion. Report any side effects during the infusion to your health care professional. What side effects may I notice from receiving this medication? Side effects that you  should report to your doctor or health care professional as soon as possible: breathing problems chest pain or palpitations dizziness feeling faint or lightheaded fever or chills skin rash, itching or hives sore throat swelling of the face, lips, or tongue swelling of the legs or ankles unusually weak or tired Side effects that usually do not require medical attention (report to your doctor or health care professional if they continue or are bothersome): diarrhea hair loss nausea, vomiting tiredness This list may not describe all possible side effects. Call your doctor for medical advice about side effects. You may report side effects to FDA at 1-800-FDA-1088. Where should I keep my medication? This drug is given in a hospital or clinic and will not be stored at home. NOTE: This sheet is a summary. It may not cover all possible information. If you have questions about this medicine, talk to your doctor, pharmacist, or health care provider.  2023 Elsevier/Gold Standard (2015-05-29 00:00:00) Trastuzumab injection for infusion What is this medication? TRASTUZUMAB (tras TOO zoo mab) is a monoclonal antibody. It is used to treat breast cancer and stomach cancer. This medicine may be used for other purposes; ask your health care provider or pharmacist if you have questions. COMMON BRAND NAME(S): Herceptin, Herzuma,  Ronnie Doss What should I tell my care team before I take this medication? They need to know if you have any of these conditions: heart disease heart failure lung or breathing disease, like asthma an unusual or allergic reaction to trastuzumab, benzyl alcohol, or other medications, foods, dyes, or preservatives pregnant or trying to get pregnant breast-feeding How should I use this medication? This drug is given as an infusion into a vein. It is administered in a hospital or clinic by a specially trained health care professional. Talk to your  pediatrician regarding the use of this medicine in children. This medicine is not approved for use in children. Overdosage: If you think you have taken too much of this medicine contact a poison control center or emergency room at once. NOTE: This medicine is only for you. Do not share this medicine with others. What if I miss a dose? It is important not to miss a dose. Call your doctor or health care professional if you are unable to keep an appointment. What may interact with this medication? This medicine may interact with the following medications: certain types of chemotherapy, such as daunorubicin, doxorubicin, epirubicin, and idarubicin This list may not describe all possible interactions. Give your health care provider a list of all the medicines, herbs, non-prescription drugs, or dietary supplements you use. Also tell them if you smoke, drink alcohol, or use illegal drugs. Some items may interact with your medicine. What should I watch for while using this medication? Visit your doctor for checks on your progress. Report any side effects. Continue your course of treatment even though you feel ill unless your doctor tells you to stop. Call your doctor or health care professional for advice if you get a fever, chills or sore throat, or other symptoms of a cold or flu. Do not treat yourself. Try to avoid being around people who are sick. You may experience fever, chills and shaking during your first infusion. These effects are usually mild and can be treated with other medicines. Report any side effects during the infusion to your health care professional. Fever and chills usually do not happen with later infusions. Do not become pregnant while taking this medicine or for 7 months after stopping it. Women should inform their doctor if they wish to become pregnant or think they might be pregnant. Women of child-bearing potential will need to have a negative pregnancy test before starting this  medicine. There is a potential for serious side effects to an unborn child. Talk to your health care professional or pharmacist for more information. Do not breast-feed an infant while taking this medicine or for 7 months after stopping it. Women must use effective birth control with this medicine. What side effects may I notice from receiving this medication? Side effects that you should report to your doctor or health care professional as soon as possible: allergic reactions like skin rash, itching or hives, swelling of the face, lips, or tongue chest pain or palpitations cough dizziness feeling faint or lightheaded, falls fever general ill feeling or flu-like symptoms signs of worsening heart failure like breathing problems; swelling in your legs and feet unusually weak or tired Side effects that usually do not require medical attention (report to your doctor or health care professional if they continue or are bothersome): bone pain changes in taste diarrhea joint pain nausea/vomiting weight loss This list may not describe all possible side effects. Call your doctor for medical advice about side effects. You may  report side effects to FDA at 1-800-FDA-1088. Where should I keep my medication? This drug is given in a hospital or clinic and will not be stored at home. NOTE: This sheet is a summary. It may not cover all possible information. If you have questions about this medicine, talk to your doctor, pharmacist, or health care provider.  2023 Elsevier/Gold Standard (2016-05-11 00:00:00)

## 2021-11-19 NOTE — Telephone Encounter (Addendum)
I have attempted to contact this patient by phone, but line is busy.   ----- Message from [name], MD sent at 11/18/2021  7:06 PM EDT ----- Regarding: call Pls tell her son Jasmin Lewis that Boric acid solutions can be used for eye rinse but rec store bought so it is preservative free. If she is still having problems, we could try steroid eyedrops but not rec for long term

## 2021-11-20 ENCOUNTER — Inpatient Hospital Stay: Payer: Medicare Other

## 2021-11-20 DIAGNOSIS — C7951 Secondary malignant neoplasm of bone: Secondary | ICD-10-CM

## 2021-11-20 DIAGNOSIS — C50412 Malignant neoplasm of upper-outer quadrant of left female breast: Secondary | ICD-10-CM

## 2021-11-20 DIAGNOSIS — Z5112 Encounter for antineoplastic immunotherapy: Secondary | ICD-10-CM | POA: Diagnosis not present

## 2021-11-20 DIAGNOSIS — C787 Secondary malignant neoplasm of liver and intrahepatic bile duct: Secondary | ICD-10-CM

## 2021-11-20 MED ORDER — PEGFILGRASTIM-CBQV 6 MG/0.6ML ~~LOC~~ SOSY
6.0000 mg | PREFILLED_SYRINGE | Freq: Once | SUBCUTANEOUS | Status: AC
  Administered 2021-11-20: 6 mg via SUBCUTANEOUS
  Filled 2021-11-20: qty 0.6

## 2021-11-30 ENCOUNTER — Other Ambulatory Visit: Payer: Self-pay

## 2021-12-03 ENCOUNTER — Other Ambulatory Visit: Payer: Self-pay | Admitting: Oncology

## 2021-12-03 ENCOUNTER — Encounter: Payer: Self-pay | Admitting: Oncology

## 2021-12-07 ENCOUNTER — Encounter: Payer: Self-pay | Admitting: Oncology

## 2021-12-08 ENCOUNTER — Encounter: Payer: Self-pay | Admitting: Oncology

## 2021-12-08 ENCOUNTER — Other Ambulatory Visit: Payer: Self-pay

## 2021-12-09 ENCOUNTER — Other Ambulatory Visit: Payer: Self-pay | Admitting: Oncology

## 2021-12-09 ENCOUNTER — Encounter: Payer: Self-pay | Admitting: Oncology

## 2021-12-09 ENCOUNTER — Inpatient Hospital Stay: Payer: Medicare Other

## 2021-12-09 ENCOUNTER — Inpatient Hospital Stay: Payer: Medicare Other | Attending: Oncology | Admitting: Oncology

## 2021-12-09 VITALS — BP 140/66 | HR 87 | Temp 97.6°F | Resp 18 | Ht 60.25 in | Wt 112.1 lb

## 2021-12-09 DIAGNOSIS — I890 Lymphedema, not elsewhere classified: Secondary | ICD-10-CM | POA: Insufficient documentation

## 2021-12-09 DIAGNOSIS — Z9221 Personal history of antineoplastic chemotherapy: Secondary | ICD-10-CM | POA: Insufficient documentation

## 2021-12-09 DIAGNOSIS — Z5111 Encounter for antineoplastic chemotherapy: Secondary | ICD-10-CM | POA: Insufficient documentation

## 2021-12-09 DIAGNOSIS — C50912 Malignant neoplasm of unspecified site of left female breast: Secondary | ICD-10-CM | POA: Diagnosis not present

## 2021-12-09 DIAGNOSIS — I10 Essential (primary) hypertension: Secondary | ICD-10-CM | POA: Insufficient documentation

## 2021-12-09 DIAGNOSIS — Z853 Personal history of malignant neoplasm of breast: Secondary | ICD-10-CM | POA: Insufficient documentation

## 2021-12-09 DIAGNOSIS — C7951 Secondary malignant neoplasm of bone: Secondary | ICD-10-CM

## 2021-12-09 DIAGNOSIS — C50412 Malignant neoplasm of upper-outer quadrant of left female breast: Secondary | ICD-10-CM | POA: Insufficient documentation

## 2021-12-09 DIAGNOSIS — Z5189 Encounter for other specified aftercare: Secondary | ICD-10-CM | POA: Insufficient documentation

## 2021-12-09 DIAGNOSIS — Z9012 Acquired absence of left breast and nipple: Secondary | ICD-10-CM | POA: Insufficient documentation

## 2021-12-09 DIAGNOSIS — C787 Secondary malignant neoplasm of liver and intrahepatic bile duct: Secondary | ICD-10-CM | POA: Diagnosis not present

## 2021-12-09 DIAGNOSIS — Z5112 Encounter for antineoplastic immunotherapy: Secondary | ICD-10-CM | POA: Insufficient documentation

## 2021-12-09 DIAGNOSIS — C7989 Secondary malignant neoplasm of other specified sites: Secondary | ICD-10-CM

## 2021-12-09 DIAGNOSIS — Z171 Estrogen receptor negative status [ER-]: Secondary | ICD-10-CM

## 2021-12-09 DIAGNOSIS — C779 Secondary and unspecified malignant neoplasm of lymph node, unspecified: Secondary | ICD-10-CM | POA: Insufficient documentation

## 2021-12-09 DIAGNOSIS — Z85038 Personal history of other malignant neoplasm of large intestine: Secondary | ICD-10-CM | POA: Insufficient documentation

## 2021-12-09 LAB — CBC AND DIFFERENTIAL
HCT: 37 (ref 36–46)
Hemoglobin: 12.3 (ref 12.0–16.0)
Neutrophils Absolute: 4.69
Platelets: 195 10*3/uL (ref 150–400)
WBC: 7.1

## 2021-12-09 LAB — CBC: RBC: 3.81 — AB (ref 3.87–5.11)

## 2021-12-09 MED ORDER — GABAPENTIN 100 MG PO CAPS: 100.0000 mg | ORAL_CAPSULE | Freq: Every day | ORAL | 5 refills | Status: DC

## 2021-12-09 MED FILL — Zoledronic Acid Inj Conc For IV Infusion 4 MG/5ML: INTRAVENOUS | Qty: 3.75 | Status: AC

## 2021-12-09 MED FILL — Trastuzumab-dkst For IV Soln 150 MG: INTRAVENOUS | Qty: 14.3 | Status: AC

## 2021-12-09 MED FILL — Docetaxel Soln for IV Infusion 160 MG/16ML: INTRAVENOUS | Qty: 9 | Status: AC

## 2021-12-09 MED FILL — Pertuzumab Soln for IV Infusion 420 MG/14ML (30 MG/ML): INTRAVENOUS | Qty: 14 | Status: AC

## 2021-12-09 MED FILL — Dexamethasone Sodium Phosphate Inj 100 MG/10ML: INTRAMUSCULAR | Qty: 1 | Status: AC

## 2021-12-09 NOTE — Progress Notes (Signed)
[facility]  [address] [place],  [place]  [address] [phone]  Clinic Day: 12/09/21  Referring physician: [name], MD  ASSESSMENT & PLAN:   Stage IIB (T2c N1 M0) HER2 receptor positive left breast cancer, diagnosed in June 2020. This was ER/PR negative. This was treated with neoadjuvant Kadcyla/Perjeta and left mastectomy, with a complete response in breast and nodes. She had only received two doses of adjuvant Herceptin before being discontinued in December 2020.  History of stage II colon cancer, diagnosed in May 2007. This was treated with surgical resection and adjuvant chemotherapy with Xeloda. Colonoscopy from 2013 revealed 2 polyps which were resected and chronic diverticulosis.  Liver metastases discovered 04/17/21.  Numerous heterogeneously enhancing, internally necrotic lesions in the right hepatic lobe, consistent with metastatic disease. Largest lesion measures up to 6.7 cm and there are at least 6 lesions. We now have biopsy proven HER2 positive recurrent breast cancer with ER/PR negative. She is receiving THP and is tolerating it without significant difficulty. She had improvement in her liver function tests after 1 cycle, and they are now normal.  CT scan from May showed excellent response with significant decrease in the size of her liver lesions. Her current CT from 12/04/21 shows continued shrinkage of the liver lesions.  Bone metastases discovered 04/17/21. Enhancing osseous lesions are present in the L2 vertebral body, both iliac bones, and in the sacrum, highly concerning for osseous metastases. She received her 1st dose of zoledronic acid on January 16th. Her widespread bone metastases show an interval increase in sclerosis of previously lytic lesions consistent with treatment response. She has a new wedge deformity of T6 and increased wedge deformity of L2.  Left supraclavicular lymph node measuring 2-3 cm. Also involvement of  subpectoral node and chest wall skin. This was rapidly progressive despite receiving  trastuzumab.  We have added in Perjeta and Taxotere to her current regimen and this has been very effective.  The CT scan also shows good response of her adenopathy with resolution of the previous bulky left axillary and subpectoral adenopathy and skin involvement.  Lymphedema of the left upper extremity.  It is very mild at this time  Hypercalcemia, now resolved.  Numbness and paresthesias of the left hand which I feel is related to her spine metastasis involvement at T5.  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  We referred her to physical therapy and hand specialist but she feels she has had very little benefit and so we will stop that.   She has been on THP since mid February and is clearly responding well by CT scan and examination.  I reviewed the results of her 12/04/21 scans in detail with her and her son and answered their questions.  I explained she will need to continue this therapy. She is still very anxious and needs reassurance.  Her main concern is the disability that has developed of her left upper extremity. She has tolerated this regimen without much difficulty and we can clearly see resolution of the skin lesions of the left mastectomy and the left axillary adenopathy, and improvement in her liver lesions.  We will see her back in 3 weeks with CBC and CMP. She and her son understand and agree with this plan of care. I have answered their questions and she knows to call with any concerns.   I provided 30 minutes of  face-to-face time during this this encounter and > 50% was spent counseling as documented under my assessment and plan.    [name], MD [facility] [address] [place] [address] Dept: [phone] Dept Fax: [phone]       Orders  Placed This Encounter  Procedures   CBC and differential      This external order was created through the Results Console.   CBC      This external order was created through the Results Console.   Basic metabolic panel      This external order was created through the Results Console.   Comprehensive metabolic panel      This external order was created through the Results Console.   Hepatic function panel      This external order was created through the Results Console.   CBC      This order was created through External Result Entry        CHIEF COMPLAINT:  CC: Metastatic HER2 receptor positive breast cancer   Current Treatment: Docetaxel/trastuzumab/pertuzumab every 3 weeks   HISTORY OF PRESENT ILLNESS:  Jasmin Lewis is a 88 y.o. female who we began seeing in January for transfer of care for recurrent stage IIB left breast cancer. She was originally diagnosed in June 2020 when she was able to palpate a mass of the left breast. Mammogram and ultrasound from May 2020 revealed a 2.9 cm irregular mass in the upper outer left breast as well as two adjacent abnormal left axillary lymph nodes. 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 Left axillary lymph node biopsy was positive for invasive ductal carcinoma. HER2 was positive 3+. Estrogen and progesterone receptors were both negative at 0%, and Ki67 was 70%. Breast MRI from July revealed a 3.3 cm mass of the lateral portion of the left breast as well as significant non mass enhancement surrounding this mass and extending anteriorly into the nipple base, with largest diameter in the anterior to posterior axis, measuring 7.2 centimeters. Three enlarged left axillary lymph nodes. Right breast was negative.  PET imaging and July 2020 revealed no evidence of additional metastatic involvement. She received neoadjuvant Kadcyla/Perjeta for 6 cycles. Repeat breast MRI and October 2020 revealed complete resolution  of the enhancing mass and associated non mass enhancement within the left breast. Previously identified left axillary lymphadenopathy was no longer identified. She underwent left mastectomy and targeted lymph node dissection in November 2020.  Pathology revealed a complete response with no residual disease.. Three sentinel lymph nodes were negative for malignancy (0/3). She continued maintenance Herceptin injections for two more doses, but this was discontinued in December 2020, as she refused further ECHO scans. She has had chronic lymphedema of the left upper extremity since November 2022 after an accident, for which she wears a compression sleeve. However, her skin is reacting to the plastic cuff of the sleeve, even though she has tried turning it inside out. She has limited mobility of the right upper extremity due to prior incident of dislocated shoulder.   Of note, she has a history of stage II colon cancer, diagnosed in May 2007. Preop CEA was 5.2. She underwent surgical resection and surgical pathology revealed a 5.9 x 3.8 x 1.2 cm moderate to well-differentiated adenocarcinoma penetrating the muscular wall. Forty lymph nodes negative. No vascular or lymphatic invasion. Multiple diverticula with microabscess formation and inflammation. Carcinoma present in at least 1 diverticulum.  She received adjuvant chemotherapy with Xeloda for 9 months. Colonoscopy from August 2013 revealed 2 polyps, which were resected and chronic diverticulosis.   She presented to [facility] in December 2022 due to constipation and back pain with spasms. CT abdomen/pelvis revealed new lytic lesion involving the L2 vertebral body with minimal cortical breakthrough superiorly, but with preservation of vertebral body height, concerning for metastatic disease. There were several indeterminate hepatic lesions. In addition to the lesions, there is an additional subtle 1.5 cm hypodensity in the hepatic dome. MRI abdomen in January  revealed numerous heterogeneously enhancing, internally necrotic lesions in the right hepatic lobe, highly concerning for metastatic disease.  The largest lesion measures up to 6.7 cm in hepatic segment VI. Enhancing osseous lesions in the L2 vertebral body, both iliac bones, and in the sacrum, highly concerning for osseous metastases were also noted.  Further evaluation with PET scan, MRI brain and spine, as well as echocardiogram were recommended. We also referred her to Dr. Lilia Pro for node biopsy and port placement. When he saw her, he found a nodule in the upper left chest wall which was suspicious and biopsied this rather than the left supraclavicular node. Pathology revealed carcinoma, 5 mm nodule, involving dermis, subcutaneous tissue and lymphovascular space. CK7 positive, estrogen staining is mostly negative, but there was some faint positivity. HER2 was positive 3+. Estrogen and progesterone receptors were negative. Ki67 was 70%.  This was compatible with metastatic breast carcinoma from her previous cancer.   She was initially treated with single agent trastuzumab in January, but had progressive skin metastasis and palpable adenopathy, so docetaxel and pertuzumab were added in February.  Repeat imaging in May revealed good response to treatment.  She has completed 6 cycles of docetaxel/trastuzumab/pertuzumab.         Oncology History Overview Note   Cancer Staging Malignant neoplasm of upper-outer quadrant of left breast in female, estrogen receptor negative ([place]) Staging form: Breast, AJCC 8th Edition - Clinical stage from 10/11/2018: Stage IIB (cT2, cN1, cM0, G3, ER-, PR-, HER2+) - Signed by Truitt Merle, MD on 11/02/2018 - Clinical: No stage assigned - Unsigned      History of colon cancer, stage II   09/2005 Initial Diagnosis     stage II adenocarcinoma of the sigmoid colon diagnosed in May 2007     09/21/2005 Surgery     She underwent surgical resection on 09/21/2005. Findings were a 5.9 x  3.8 x 1.2 cm moderate to well-differentiated adenocarcinoma penetrating the muscular wall. Forty lymph nodes negative. No vascular or lymphatic invasion. Multiple diverticula with microabscess formation and inflammation. Carcinoma present in at least 1 diverticulum. Preop CEA 5.2 with lab normal range 0 to 2.5. Preop CT scan with no obvious additional pathology.      2007 -  Chemotherapy     She received oral Xeloda chemotherapy as an adjuvant. She declined treatment on a clinical trial.       11/12/2011 Initial Diagnosis     [name], stage II     12/09/2011 Procedure     Followup colonoscopy done on 12/09/2011. She was found to have 2 polyps which were removed. Chronic diverticulosis.        Malignant neoplasm of upper-outer quadrant of left breast in female, estrogen receptor negative ([place])   09/26/2018 Mammogram     Mammogram/US of left breast 09/26/18 IMPRESSION:  1. 2.9cm irregular mass in the upper outer left breast corresponds to the palpable abnormality. This is highly suspicious for  breast carcinoma.  2. Two adjacent abnormal left axillary  Lymph nodes suspicious for metastatic adenopathy. There is a third borderline abnormal left axillary LN with a cortex thickened to 35m.  3. Benign right breast cyst. No evidence of right breast malignancy.      10/11/2018 Cancer Staging     Staging form: Breast, AJCC 8th Edition - Clinical stage from 10/11/2018: Stage IIB (cT2, cN1, cM0, G3, ER-, PR-, HER2+) - Signed by [name] MD on 11/02/2018     10/11/2018 Initial Biopsy     Diagnosis 10/11/18 1. Breast, left, needle core biopsy, upper outer left 2 o'clock - INVASIVE DUCTAL CARCINOMA. - DUCTAL CARCINOMA IN SITU. - LYMPHOVASCULAR INVASION IS IDENTIFIED. - SEE COMMENT. 2. Lymph node, needle/core biopsy, left axilla - INVASIVE DUCTAL CARCINOMA. - SEE COMMENT.     10/11/2018 Receptors her2     The tumor cells are POSITIVE for Her2 (3+). Estrogen Receptor: 0%, NEGATIVE Progesterone  Receptor: 0%, NEGATIVE Proliferation Marker Ki67: 70%     11/02/2018 Initial Diagnosis     Malignant neoplasm of upper-outer quadrant of left breast in female, estrogen receptor negative [place]     11/15/2018 Breast MRI     MRI breast 11/15/18  IMPRESSION: 1. 3.3 centimeter mass in the LATERAL portion of the LEFT breast consistent with known malignancy. 2. There is significant non mass enhancement surrounding this mass and extending anteriorly into the nipple base, with largest diameter in the anterior to posterior axis, measuring 7.2 centimeters. 3. If the patient would consider breast conservation, additional MR guided core biopsies are recommended. Consider biopsy of the inferior and anterior extent of the non mass enhancement to document extent of disease. 4. Three enlarged LEFT axillary lymph nodes. 5. RIGHT breast is negative.     11/15/2018 PET scan     PET 11/15/18 IMPRESSION: Hypermetabolic left breast lesion compatible with known primary. Hypermetabolic left axillary lymph nodes are consistent with metastatic disease.   No evidence for additional hypermetabolic metastatic involvement in the neck, chest, abdomen, or pelvis.     11/17/2018 - 03/01/2019 Chemotherapy     Neo-adjuvant Kadcyla and perjeta q3weeks for 6 cycles starting 11/17/18. Stopped before surgery      11/29/2018 Pathology Results     Diagnosis 1. Breast, left, needle core biopsy, inferior anterior (cylinder clip) - INVASIVE DUCTAL CARCINOMA. - DUCTAL CARCINOMA IN SITU. - LYMPHOVASCULAR INVASION IS IDENTIFIED. - SEE COMMENT. 2. Breast, left, needle core biopsy, central posterior (barbell clip) - INVASIVE DUCTAL CARCINOMA. - LYMPHOVASCULAR INVASION IS IDENTIFIED.     02/12/2019 Breast MRI     IMPRESSION: 1. Complete resolution of previously identified enhancing mass and associated non mass enhancement within the left breast. This is consistent with excellent response to chemotherapy. No residual  or suspicious findings are identified. 2. No MRI evidence of malignancy on the right. 3. Previously identified left axillary lymphadenopathy not definitively seen on today's study. However, this may be due to decreased field-of-view compared to prior study.       03/14/2019 Cancer Staging     Staging form: Breast, AJCC 8th Edition - Pathologic stage from 03/14/2019: pT0, pN0, cM0, GX, ER: Unknown, PR: Unknown, HER2: Not Assessed - Signed by [name] MD on 03/28/2019     03/14/2019 Surgery     LEFT MASTECTOMY WITH TARGETED LYMPH NODE DISSECTION and Left Axillary Sentinel Lymph  Node Biopsy by Dr TMarlou Starks11/4/20      03/14/2019 Pathology Results     FINAL MICROSCOPIC DIAGNOSIS:  A. LYMPH NODE, LEFT, SENTINEL, BIOPSY:  - There is no evidence of carcinoma in 1 of 1 lymph node (0/1).    B. BREAST, LEFT, MASTECTOMY:  - Benign breast parenchyma with treatment-related changes.  - There is no evidence of malignancy.  - See oncology table below.    C. LYMPH NODE, LEFT #1, SENTINEL, BIOPSY:  - There is no evidence of carcinoma in 1 of 1 lymph node (0/1).    D. LYMPH NODE, LEFT #2, SENTINEL, BIOPSY:  - There is no evidence of carcinoma in 1 of 1 lymph node (0/1).        04/04/2019 - 04/25/2019 Chemotherapy     Maintenance Herceptin injections every 3 weeks starting 04/03/19 to complete 1 year of treatment that was started in 11/2018. Stopped after 2nd dose as she will not be repeating Echos.      06/03/2021 - 06/24/2021 Chemotherapy     Patient is on Treatment Plan : BREAST Trastuzumab q21d X 11 Cycles      06/25/2021 -  Chemotherapy     Patient is on Treatment Plan : BREAST Docetaxel + Trastuzumab + Pertuzumab (THP) q21d      09/14/2021 Imaging     CT chest/abdomen/pelvis   IMPRESSION:  1. Interval resolution of previously seen bulky left axillary and  subpectoral lymphadenopathy. No persistently enlarged lymph nodes.  2. Multiple liver lesions are significantly diminished in size.  3.  Widespread osseous metastatic disease, with an interval increase  in sclerosis of several previously lytic lesions.  4. Constellation of findings is consistent with treatment response  of metastatic disease  5. New, although age indeterminate pathologic wedge deformity of the  T6 vertebral body as well as an increased pathologic wedge deformity  of the L2 vertebral body.  6. Coronary artery disease.  Aortic Atherosclerosis (ICD10-I70.0).      Malignant neoplasm metastatic to liver ([place])   04/17/2021 Imaging     CT ABDOMEN AND PELVIS WITH CONTRAST: -New lytic lesion involving the L2 vertebral body (6:72, 2:26) with minimal cortical breakthrough superiorly, but with preservation of vertebral body height, concerning for metastatic disease.  -There are several indeterminate hepatic lesions as described above. In addition to the lesions described above, there is an additional subtle 1.5 cm hypodensity in the hepatic dome (2:7). Given clinical history and presence of a new L2 lesion, leading differential consideration is metastatic disease. Recommend further evaluation with MRI of the abdomen with and without contrast.     05/12/2021 Imaging     MRI ABDOMEN WITH AND WITHOUT CONTRAST: Numerous heterogeneously enhancing, internally necrotic lesions in the right hepatic lobe, highly concerning for metastatic disease. Largest lesion measures up to 6.7 cm in hepatic segment VI.   Enhancing osseous lesions in the L2 vertebral body, both iliac bones, and in the sacrum, highly concerning for osseous metastases.     05/15/2021 Initial Diagnosis     Liver metastases ([place])     05/27/2021 PET scan     1. Widespread recurrent/metastatic disease in this patient who is  status post bilateral mastectomy. Left chest wall recurrence with  left axillary/subpectoral, supraclavicular nodal metastasis.  2. Hepatic, left adrenal, and widespread osseous metastasis.  3. Incidental findings, including: Right  nephrolithiasis. Tiny  hiatal hernia.      05/27/2021 Imaging     MRI LUMBAR AND THORACIC SPINE: Thoracic spine:  Osseous metastatic disease involving each level. The most dramatic deposits are at T4 and T6 where there is extraosseous/epidural tumor. No cord compression  but there is foraminal impingement on the left at T6-7 and on the right at T3-4. Early dorsal epidural tumor at the level of T10 and T3.    Lumbar spine:  1. Widespread osseous metastatic disease with largest deposit  replacing the L2 body where there is a compression fracture with  mild height loss. Also at this level is early epidural tumor  extension likely affecting both L2-3 foramina.  2. 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      05/27/2021 Imaging     MRI HEAD WITH AND WITHOUT CONTRAST: Negative for metastatic disease to the brain or calvarium.     06/03/2021 - 06/24/2021 Chemotherapy     Patient is on Treatment Plan : BREAST Trastuzumab q21d X 11 Cycles      06/25/2021 -  Chemotherapy     Patient is on Treatment Plan : BREAST Docetaxel + Trastuzumab + Pertuzumab (THP) q21d      09/14/2021 Imaging     CT chest/abdomen/pelvis   IMPRESSION:  1. Interval resolution of previously seen bulky left axillary and  subpectoral lymphadenopathy. No persistently enlarged lymph nodes.  2. Multiple liver lesions are significantly diminished in size.  3. Widespread osseous metastatic disease, with an interval increase  in sclerosis of several previously lytic lesions.  4. Constellation of findings is consistent with treatment response  of metastatic disease  5. New, although age indeterminate pathologic wedge deformity of the  T6 vertebral body as well as an increased pathologic wedge deformity  of the L2 vertebral body.  6. Coronary artery disease.  Aortic Atherosclerosis (ICD10-I70.0).      Malignant neoplasm metastatic to bone ([place])   04/17/2021 Imaging     CT ABDOMEN AND PELVIS WITH  CONTRAST: -New lytic lesion involving the L2 vertebral body (6:72, 2:26) with minimal cortical breakthrough superiorly, but with preservation of vertebral body height, concerning for metastatic disease.  -There are several indeterminate hepatic lesions as described above. In addition to the lesions described above, there is an additional subtle 1.5 cm hypodensity in the hepatic dome (2:7). Given clinical history and presence of a new L2 lesion, leading differential consideration is metastatic disease. Recommend further evaluation with MRI of the abdomen with and without contrast.     05/12/2021 Imaging     MRI ABDOMEN WITH AND WITHOUT CONTRAST: Numerous heterogeneously enhancing, internally necrotic lesions in the right hepatic lobe, highly concerning for metastatic disease. Largest lesion measures up to 6.7 cm in hepatic segment VI.   Enhancing osseous lesions in the L2 vertebral body, both iliac bones, and in the sacrum, highly concerning for osseous metastases.     05/15/2021 Initial Diagnosis     Bone metastases ([place])     05/27/2021 PET scan     1. Widespread recurrent/metastatic disease in this patient who is  status post bilateral mastectomy. Left chest wall recurrence with  left axillary/subpectoral, supraclavicular nodal metastasis.  2. Hepatic, left adrenal, and widespread osseous metastasis.  3. Incidental findings, including: Right nephrolithiasis. Tiny  hiatal hernia.      05/27/2021 Imaging     MRI LUMBAR AND THORACIC SPINE: Thoracic spine:  Osseous metastatic disease involving each level. The most dramatic deposits are at T4 and T6 where there is extraosseous/epidural tumor. No cord compression but there is foraminal impingement on the left at T6-7 and on the right at T3-4. Early dorsal epidural tumor at the level of T10 and T3.    Lumbar spine:  1. Widespread osseous metastatic disease  with largest deposit  replacing the L2 body where there is a compression fracture with  mild  height loss. Also at this level is early epidural tumor  extension likely affecting both L2-3 foramina.  2. 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      05/27/2021 Imaging     MRI HEAD WITH AND WITHOUT CONTRAST: Negative for metastatic disease to the brain or calvarium.     06/03/2021 - 06/24/2021 Chemotherapy     Patient is on Treatment Plan : BREAST Trastuzumab q21d X 11 Cycles      06/25/2021 -  Chemotherapy     Patient is on Treatment Plan : BREAST Docetaxel + Trastuzumab + Pertuzumab (THP) q21d      09/14/2021 Imaging     CT chest/abdomen/pelvis   IMPRESSION:  1. Interval resolution of previously seen bulky left axillary and  subpectoral lymphadenopathy. No persistently enlarged lymph nodes.  2. Multiple liver lesions are significantly diminished in size.  3. Widespread osseous metastatic disease, with an interval increase  in sclerosis of several previously lytic lesions.  4. Constellation of findings is consistent with treatment response  of metastatic disease  5. New, although age indeterminate pathologic wedge deformity of the  T6 vertebral body as well as an increased pathologic wedge deformity  of the L2 vertebral body.  6. Coronary artery disease.  Aortic Atherosclerosis (ICD10-I70.0).            INTERVAL HISTORY:  Jasmin Lewis is here today for repeat clinical assessment prior to her ninth cycle of palliative docetaxel/trastuzumab/pertuzumab.  She reports improved lymphedema of the left arm, but perhaps worsening numbness and weakness of the left hand.  She has seen a hand specialist, but did not feel she was getting any results. We discussed a referral to a lymphedema specialist in [place]. I have recommended a low dose of gabapentin at 100 mg at bedtime. She denies any changes in her skin.  She denies abdominal pain.  She denies fevers or chills. She denies pain. Her appetite varies. Her weight is stable.  Her son accompanies her as usual.CT  scan of her cervical spine reveals extensive degenerative disk disease with neural foraminal stenosis at multiple levels. MRI of the left shoulder/humerus does not show any specific findings of malignancy. On CT imaging 12/04/21, all of the bony metastasis had increasing sclerosis consistent with treatment effect.  The largest liver lesion decreased from 3.5 cm to 1.2 cm in May, and now 0.7 cm. The prior lymphadenopathy has resolved.   REVIEW OF SYSTEMS:  Review of Systems  Constitutional:  Negative for appetite change, chills, fatigue, fever and unexpected weight change.  HENT:   Negative for lump/mass, mouth sores and sore throat.   Respiratory:  Negative for cough and shortness of breath.   Cardiovascular:  Negative for chest pain and leg swelling.  Gastrointestinal:  Negative for abdominal pain, constipation, diarrhea, nausea and vomiting.  Endocrine: Negative for hot flashes.  Genitourinary:  Negative for difficulty urinating, dysuria, frequency and hematuria.   Musculoskeletal:  Negative for arthralgias, back pain and myalgias.  Skin:  Negative for rash.  Neurological:  Negative for dizziness and headaches.  Hematological:  Negative for adenopathy. Does not bruise/bleed easily.  Psychiatric/Behavioral:  Negative for depression and sleep disturbance. The patient is not nervous/anxious.       VITALS:  Blood pressure (!) 157/68, pulse 71, temperature 97.9 F (36.6 C), temperature source Oral, resp. rate 18, height 5' 0.25" (1.53 m), weight 112 lb 4.8  oz (50.9 kg), SpO2 96 %.     Wt Readings from Last 3 Encounters:  10/28/21 112 lb 4.8 oz (50.9 kg)  10/08/21 108 lb (49 kg)  10/07/21 111 lb 8 oz (50.6 kg)    Body mass index is 21.75 kg/m.   Performance status (ECOG): 2 - Symptomatic, <50% confined to bed   PHYSICAL EXAM:  Physical Exam Vitals and nursing note reviewed.  Constitutional:      General: She is not in acute distress.    Appearance: Normal appearance.  HENT:      Head: Normocephalic and atraumatic.     Mouth/Throat:     Mouth: Mucous membranes are moist.     Pharynx: Oropharynx is clear. No oropharyngeal exudate or posterior oropharyngeal erythema.  Eyes:     General: No scleral icterus.    Extraocular Movements: Extraocular movements intact.     Conjunctiva/sclera: Conjunctivae normal.     Pupils: Pupils are equal, round, and reactive to light.  Cardiovascular:     Rate and Rhythm: Normal rate and regular rhythm.     Heart sounds: Normal heart sounds. No murmur heard.    No friction rub. No gallop.  Pulmonary:     Effort: Pulmonary effort is normal.     Breath sounds: Normal breath sounds. No wheezing, rhonchi or rales.  Abdominal:     General: There is no distension.     Palpations: Abdomen is soft. There is no hepatomegaly, splenomegaly or mass.     Tenderness: There is no abdominal tenderness.  Musculoskeletal:        General: Swelling (Mild lymphedema of the left upper extremity) present. Normal range of motion.     Cervical back: Normal range of motion and neck supple. No tenderness.     Right lower leg: No edema.     Left lower leg: No edema.  Lymphadenopathy:     Cervical: No cervical adenopathy.     Upper Body:     Right upper body: Supraclavicular adenopathy (Several small 1 cm or less) present. No axillary adenopathy.     Left upper body: Supraclavicular adenopathy (Several small 1 cm or less) present. No axillary adenopathy.     Lower Body: No right inguinal adenopathy. No left inguinal adenopathy.  Skin:    General: Skin is warm and dry.     Coloration: Skin is not jaundiced.     Findings: Erythema (Persistent area of erythema in the left upper chest wall she is not raised, no other concerning areas) present. No rash.  Neurological:     Mental Status: She is alert and oriented to person, place, and time.     Cranial Nerves: No cranial nerve deficit.     Sensory: Sensory deficit (Decreased sensation of the left arm) present.      Motor: Weakness (Weakness of the left arm and hand with limited grip) present.  Psychiatric:        Mood and Affect: Mood normal.        Behavior: Behavior normal.        Thought Content: Thought content normal.        LABS:        Latest Ref Rng & Units 10/28/2021   12:00 AM 10/07/2021   12:00 AM 09/14/2021   12:00 AM  CBC  WBC   8.5     6.5     8.4      Hemoglobin 12.0 - 16.0 36.7     12.0  11.7      Hematocrit 36 - 46 192     36     36      Platelets 150 - 400 K/uL   180     181         This result is from an external source.        Latest Ref Rng & Units 10/28/2021   12:00 AM 10/07/2021   12:00 AM 09/14/2021   12:00 AM  CMP  BUN 4 - _0 Creatinine 0.5 - 1.1 0.6     0.6     0.5      Sodium 137 - 147 138     138     138      Potassium 3.5 - 5.1 mEq/L 4.8     4.2     4.3      Chloride 99 - 108 105     106     106      CO2 13 - _1 Calcium 8.7 - 10.7 8.7     8.8     8.7      Alkaline Phos 25 - 125 68     70     81      AST 13 - 35 43     39     45      ALT 7 - 35 U/L _2 This result is from an external source.        Recent Labs       Lab Results  Component Value Date    CEA1 1.8 06/03/2021    CEA 0.5 11/13/2012     /  Last Labs         CEA  Date Value Ref Range Status  06/03/2021 1.8 0.0 - 4.7 ng/mL Final      Comment:      (NOTE)                             Nonsmokers          <3.9                             Smokers             <5.6 [employer] Electrochemiluminescence Immunoassay (ECLIA) Values obtained with different assay methods or kits cannot be used interchangeably.  Results cannot be interpreted as absolute evidence of the presence or absence of malignant disease. Performed At: [facility] [place], [place] [id number] Rush Farmer MD IO:[id number]    11/13/2012 0.5 0.0 - 5.0 ng/mL Final      Recent Labs  No results found for: "PSA1"    Recent Labs  No results found for: "CAN199"   Recent Labs  No results found for: "CAN125"    Recent Labs  No results found for: "TOTALPROTELP", "ALBUMINELP", "A1GS", "A2GS", "BETS", "BETA2SER", "GAMS", "MSPIKE", "SPEI"   Recent Labs  No results found for: "TIBC", "FERRITIN", "IRONPCTSAT"   Recent Labs       Lab Results  Component Value Date    LDH 186 11/12/2011  LDH 187 09/08/2010    LDH 171 09/09/2009        STUDIES:  Imaging Results  No results found.        HISTORY:        Past Medical History:  Diagnosis Date   Arthritis      shoulder   Breast cancer ([place])     Cancer ([place])      left breast ca   Colon cancer ([place])     [name], stage II 11/12/2011    Sigmoid lesion 5.9 cm  40 nodes negative but focus of cancer in a diverticum   Pre-op CEA 5.2 with lab normal up to 2.5 resected 09/21/05   Xeloda adjuvant chemotherapy   Hypertension             Past Surgical History:  Procedure Laterality Date   COLONOSCOPY       HEMICOLECTOMY   2007   MASTECTOMY WITH AXILLARY LYMPH NODE DISSECTION Left 03/14/2019    Procedure: LEFT MASTECTOMY WITH TARGETED LYMPH NODE DISSECTION;  Surgeon: Jovita Kussmaul, MD;  Location: [facility];  Service: General;  Laterality: Left;   SENTINEL NODE BIOPSY Left 03/14/2019    Procedure: Left Axillary Sentinel Lymph  Node Biopsy;  Surgeon: Jovita Kussmaul, MD;  Location: [facility] OR;  Service: General;  Laterality: Left;   TONSILLECTOMY       WISDOM TOOTH EXTRACTION               Family History  Problem Relation Age of Onset   Cancer Maternal Aunt          colon cancer       Social History:  reports that she has never smoked. She has never used smokeless tobacco. She reports that she does not drink alcohol and does not use drugs.The patient is accompanied by her son, Suezanne Jacquet, today.   Allergies: No Known Allergies   Current Medications:       Current Outpatient Medications  Medication Sig Dispense Refill   dexamethasone (DECADRON)  4 MG tablet Take 5 tabs at the night before and 5 tab the morning of chemotherapy, every 3 weeks, by mouth (Patient not taking: Reported on 07/15/2021) 60 tablet 0   fish oil-omega-3 fatty acids 1000 MG capsule Take 1 g by mouth daily.       magnesium citrate SOLN Take 0.5 Bottles by mouth as needed for severe constipation.       NON FORMULARY Magic Mouth Wash 3 parts Maalox 2 parts Benadryl 1 part Viscous Lidocaine Disp: 6oz Instructions: 65m orally swish and swallow every 3-4 hours  (Called into [facility] 07/06/21       ondansetron (ZOFRAN) 4 MG tablet Take 1 tablet (4 mg total) by mouth every 8 (eight) hours as needed for nausea or vomiting. 20 tablet 2   oxyCODONE (OXY IR/ROXICODONE) 5 MG immediate release tablet Take 1 tablet (5 mg total) by mouth every 4 (four) hours as needed. 100 tablet 0   polyethylene glycol (MIRALAX / GLYCOLAX) 17 g packet Take 17 g by mouth as needed. constipation       senna (SENOKOT) 8.6 MG TABS tablet Take 1 tablet by mouth 2 (two) times daily.        No current facility-administered medications for this visit.                   LABS:      Latest Ref Rng & Units 12/29/2021  12:00 AM 12/09/2021   12:00 AM 11/18/2021   12:00 AM  CBC  WBC  9.3     7.1     10.2      Hemoglobin 12.0 - 16.0 12.5     12.3     12.7      Hematocrit 36 - 46 36     37     38      Platelets 150 - 400 K/uL 205     195     198         This result is from an external source.      Latest Ref Rng & Units 12/29/2021   12:00 AM 11/18/2021   12:00 AM 10/28/2021   12:00 AM  CMP  BUN 4 - _0 Creatinine 0.5 - 1.1 0.6     0.7     0.6      Sodium 137 - 147 133     135     138      Potassium 3.5 - 5.1 mEq/L 4.6     4.7     4.8      Chloride 99 - 108 105     106     105      CO2 13 - _1 Calcium 8.7 - 10.7 8.9     8.9     8.7      Alkaline Phos 25 - 125 73     82     68      AST 13 - 35 33     38     43      ALT 7 - 35 U/L _2 This result is from an external source.     Lab Results  Component Value Date   CEA1 1.8 06/03/2021   CEA 0.5 11/13/2012   /  CEA  Date Value Ref Range Status  06/03/2021 1.8 0.0 - 4.7 ng/mL Final    Comment:    (NOTE)                             Nonsmokers          <3.9                             Smokers             <5.6 [employer] Electrochemiluminescence Immunoassay (ECLIA) Values obtained with different assay methods or kits cannot be used interchangeably.  Results cannot be interpreted as absolute evidence of the presence or absence of malignant disease. Performed At: [facility] [address] [place], [place] [id number] Rush Farmer MD WU:[id number]   11/13/2012 0.5 0.0 - 5.0 ng/mL Final    Lab Results  Component Value Date   LDH 186 11/12/2011   LDH 187 09/08/2010   LDH 171 09/09/2009    STUDIES:  ECHOCARDIOGRAM COMPLETE  Result Date: 12/17/2021    ECHOCARDIOGRAM REPORT   Patient Name:   Jasmin Lewis Date of Exam: 12/17/2021 Medical Rec #:  [id number]         Height:  60.2 in Accession #:    [id number]        Weight:       115.5 lb Date of Birth:  1931/08/17         BSA:          1.482 m Patient Age:    67 years          BP:           142/64 mmHg Patient Gender: F                 HR:           79 bpm. Exam Location:  [facility] Procedure: 2D Echo, Cardiac Doppler, Color Doppler and Strain Analysis Indications:    Malignant neoplasm metastatic to liver ([place]) [C78.7                 (ICD-10-CM)]; Localized edema [R60.0 (ICD-10-CM)]  History:        Patient has prior history of Echocardiogram examinations, most                 recent 05/26/2021.  Sonographer:    Philipp Deputy RDCS Referring Phys: [place]  1. GLS -13.3. Left ventricular ejection fraction, by estimation, is 60 to 65%. The left ventricle has normal function. The left ventricle has no regional wall motion abnormalities. Left ventricular  diastolic parameters were normal.  2. Right ventricular systolic function is normal. The right ventricular size is normal. There is normal pulmonary artery systolic pressure.  3. The mitral valve is normal in structure. No evidence of mitral valve regurgitation. No evidence of mitral stenosis.  4. Tricuspid valve regurgitation is mild to moderate.  5. The aortic valve is normal in structure. Aortic valve regurgitation is mild. No aortic stenosis is present.  6. The inferior vena cava is normal in size with greater than 50% respiratory variability, suggesting right atrial pressure of 3 mmHg. FINDINGS  Left Ventricle: GLS -13.3. Left ventricular ejection fraction, by estimation, is 60 to 65%. The left ventricle has normal function. The left ventricle has no regional wall motion abnormalities. The left ventricular internal cavity size was normal in size. There is no left ventricular hypertrophy. Left ventricular diastolic parameters were normal. Right Ventricle: The right ventricular size is normal. No increase in right ventricular wall thickness. Right ventricular systolic function is normal. There is normal pulmonary artery systolic pressure. The tricuspid regurgitant velocity is 2.35 m/s, and  with an assumed right atrial pressure of 3 mmHg, the estimated right ventricular systolic pressure is 78.9 mmHg. Left Atrium: Left atrial size was normal in size. Right Atrium: Right atrial size was normal in size. Pericardium: There is no evidence of pericardial effusion. Mitral Valve: The mitral valve is normal in structure. No evidence of mitral valve regurgitation. No evidence of mitral valve stenosis. Tricuspid Valve: The tricuspid valve is normal in structure. Tricuspid valve regurgitation is mild to moderate. No evidence of tricuspid stenosis. Aortic Valve: The aortic valve is normal in structure. Aortic valve regurgitation is mild. Aortic regurgitation PHT measures 479 msec. No aortic stenosis is present. Pulmonic  Valve: The pulmonic valve was normal in structure. Pulmonic valve regurgitation is not visualized. No evidence of pulmonic stenosis. Aorta: The aortic root is normal in size and structure. Venous: The inferior vena cava is normal in size with greater than 50% respiratory variability, suggesting right atrial pressure of 3 mmHg. IAS/Shunts: No atrial level shunt detected by color flow Doppler.  LEFT  VENTRICLE PLAX 2D LVIDd:         3.60 cm   Diastology LVIDs:         2.30 cm   LV e' medial:    10.10 cm/s LV PW:         1.00 cm   LV E/e' medial:  9.0 LV IVS:        1.00 cm   LV e' lateral:   11.20 cm/s LVOT diam:     1.70 cm   LV E/e' lateral: 8.1 LV SV:         59 LV SV Index:   40 LVOT Area:     2.27 cm  RIGHT VENTRICLE             IVC RV Basal diam:  3.30 cm     IVC diam: 1.50 cm RV Mid diam:    2.60 cm RV S prime:     16.20 cm/s TAPSE (M-mode): 3.0 cm LEFT ATRIUM             Index        RIGHT ATRIUM           Index LA diam:        2.90 cm 1.96 cm/m   RA Area:     17.50 cm LA Vol (A2C):   27.7 ml 18.69 ml/m  RA Volume:   42.30 ml  28.54 ml/m LA Vol (A4C):   44.5 ml 30.02 ml/m LA Biplane Vol: 36.6 ml 24.69 ml/m  AORTIC VALVE             PULMONIC VALVE LVOT Vmax:   128.00 cm/s PR End Diast Vel: 3.51 msec LVOT Vmean:  84.300 cm/s LVOT VTI:    0.258 m AI PHT:      479 msec  AORTA Ao Root diam: 2.60 cm Ao Asc diam:  2.90 cm Ao Desc diam: 1.70 cm MITRAL VALVE                TRICUSPID VALVE MV Area (PHT): 3.77 cm     TR Peak grad:   22.1 mmHg MV Decel Time: 201 msec     TR Vmax:        235.00 cm/s MV E velocity: 90.60 cm/s MV A velocity: 106.00 cm/s  SHUNTS MV E/A ratio:  0.85         Systemic VTI:  0.26 m                             Systemic Diam: 1.70 cm Jenne Campus MD Electronically signed by Jenne Campus MD Signature Date/Time: 12/17/2021/3:00:52 PM    Final

## 2021-12-10 ENCOUNTER — Inpatient Hospital Stay: Payer: Medicare Other

## 2021-12-10 ENCOUNTER — Other Ambulatory Visit: Payer: Self-pay | Admitting: Oncology

## 2021-12-10 VITALS — BP 175/79 | HR 80 | Resp 16

## 2021-12-10 DIAGNOSIS — Z5111 Encounter for antineoplastic chemotherapy: Secondary | ICD-10-CM | POA: Diagnosis present

## 2021-12-10 DIAGNOSIS — I890 Lymphedema, not elsewhere classified: Secondary | ICD-10-CM | POA: Diagnosis not present

## 2021-12-10 DIAGNOSIS — Z853 Personal history of malignant neoplasm of breast: Secondary | ICD-10-CM | POA: Diagnosis not present

## 2021-12-10 DIAGNOSIS — Z9012 Acquired absence of left breast and nipple: Secondary | ICD-10-CM | POA: Diagnosis not present

## 2021-12-10 DIAGNOSIS — C7951 Secondary malignant neoplasm of bone: Secondary | ICD-10-CM | POA: Diagnosis not present

## 2021-12-10 DIAGNOSIS — C787 Secondary malignant neoplasm of liver and intrahepatic bile duct: Secondary | ICD-10-CM | POA: Diagnosis not present

## 2021-12-10 DIAGNOSIS — Z85038 Personal history of other malignant neoplasm of large intestine: Secondary | ICD-10-CM | POA: Diagnosis not present

## 2021-12-10 DIAGNOSIS — Z5112 Encounter for antineoplastic immunotherapy: Secondary | ICD-10-CM | POA: Diagnosis present

## 2021-12-10 DIAGNOSIS — I10 Essential (primary) hypertension: Secondary | ICD-10-CM | POA: Diagnosis not present

## 2021-12-10 DIAGNOSIS — C50412 Malignant neoplasm of upper-outer quadrant of left female breast: Secondary | ICD-10-CM | POA: Diagnosis present

## 2021-12-10 DIAGNOSIS — Z9221 Personal history of antineoplastic chemotherapy: Secondary | ICD-10-CM | POA: Diagnosis not present

## 2021-12-10 DIAGNOSIS — Z171 Estrogen receptor negative status [ER-]: Secondary | ICD-10-CM

## 2021-12-10 DIAGNOSIS — Z5189 Encounter for other specified aftercare: Secondary | ICD-10-CM | POA: Diagnosis not present

## 2021-12-10 DIAGNOSIS — C779 Secondary and unspecified malignant neoplasm of lymph node, unspecified: Secondary | ICD-10-CM | POA: Diagnosis not present

## 2021-12-10 MED ORDER — TRASTUZUMAB-DKST CHEMO 150 MG IV SOLR
300.0000 mg | Freq: Once | INTRAVENOUS | Status: AC
  Administered 2021-12-10: 300 mg via INTRAVENOUS
  Filled 2021-12-10: qty 14.29

## 2021-12-10 MED ORDER — SODIUM CHLORIDE 0.9 % IV SOLN: Freq: Once | INTRAVENOUS | Status: AC

## 2021-12-10 MED ORDER — SODIUM CHLORIDE 0.9 % IV SOLN
60.0000 mg/m2 | Freq: Once | INTRAVENOUS | Status: AC
  Administered 2021-12-10: 90 mg via INTRAVENOUS
  Filled 2021-12-10: qty 9

## 2021-12-10 MED ORDER — SODIUM CHLORIDE 0.9% FLUSH
10.0000 mL | INTRAVENOUS | Status: DC | PRN
  Administered 2021-12-10: 10 mL

## 2021-12-10 MED ORDER — SODIUM CHLORIDE 0.9 % IV SOLN
420.0000 mg | Freq: Once | INTRAVENOUS | Status: AC
  Administered 2021-12-10: 420 mg via INTRAVENOUS
  Filled 2021-12-10: qty 14

## 2021-12-10 MED ORDER — DIPHENHYDRAMINE HCL 25 MG PO CAPS
25.0000 mg | ORAL_CAPSULE | Freq: Once | ORAL | Status: AC
  Administered 2021-12-10: 25 mg via ORAL
  Filled 2021-12-10: qty 1

## 2021-12-10 MED ORDER — ZOLEDRONIC ACID 4 MG/5ML IV CONC
3.0000 mg | Freq: Once | INTRAVENOUS | Status: AC
  Administered 2021-12-10: 3 mg via INTRAVENOUS
  Filled 2021-12-10: qty 3.75

## 2021-12-10 MED ORDER — HEPARIN SOD (PORK) LOCK FLUSH 100 UNIT/ML IV SOLN
500.0000 [IU] | Freq: Once | INTRAVENOUS | Status: AC | PRN
  Administered 2021-12-10: 500 [IU]

## 2021-12-10 MED ORDER — SODIUM CHLORIDE 0.9 % IV SOLN: Freq: Once | INTRAVENOUS | Status: DC

## 2021-12-10 MED ORDER — ACETAMINOPHEN 325 MG PO TABS
650.0000 mg | ORAL_TABLET | Freq: Once | ORAL | Status: AC
  Administered 2021-12-10: 650 mg via ORAL
  Filled 2021-12-10: qty 2

## 2021-12-10 MED ORDER — SODIUM CHLORIDE 0.9 % IV SOLN
10.0000 mg | Freq: Once | INTRAVENOUS | Status: AC
  Administered 2021-12-10: 10 mg via INTRAVENOUS
  Filled 2021-12-10: qty 10

## 2021-12-10 NOTE — Patient Instructions (Signed)
Zoledronic Acid Injection (Cancer) What is this medication? ZOLEDRONIC ACID (ZOE le dron ik AS id) treats high calcium levels in the blood caused by cancer. It may also be used with chemotherapy to treat weakened bones caused by cancer. It works by slowing down the release of calcium from bones. This lowers calcium levels in your blood. It also makes your bones stronger and less likely to break (fracture). It belongs to a group of medications called bisphosphonates. This medicine may be used for other purposes; ask your health care provider or pharmacist if you have questions. COMMON BRAND NAME(S): Zometa, Zometa Powder What should I tell my care team before I take this medication? They need to know if you have any of these conditions: Dehydration Dental disease Kidney disease Liver disease Low levels of calcium in the blood Lung or breathing disease, such as asthma Receiving steroids, such as dexamethasone or prednisone An unusual or allergic reaction to zoledronic acid, other medications, foods, dyes, or preservatives Pregnant or trying to get pregnant Breast-feeding How should I use this medication? This medication is injected into a vein. It is given by your care team in a hospital or clinic setting. Talk to your care team about the use of this medication in children. Special care may be needed. Overdosage: If you think you have taken too much of this medicine contact a poison control center or emergency room at once. NOTE: This medicine is only for you. Do not share this medicine with others. What if I miss a dose? Keep appointments for follow-up doses. It is important not to miss your dose. Call your care team if you are unable to keep an appointment. What may interact with this medication? Certain antibiotics given by injection Diuretics, such as bumetanide, furosemide NSAIDs, medications for pain and inflammation, such as ibuprofen or naproxen Teriparatide Thalidomide This list  may not describe all possible interactions. Give your health care provider a list of all the medicines, herbs, non-prescription drugs, or dietary supplements you use. Also tell them if you smoke, drink alcohol, or use illegal drugs. Some items may interact with your medicine. What should I watch for while using this medication? Visit your care team for regular checks on your progress. It may be some time before you see the benefit from this medication. Some people who take this medication have severe bone, joint, or muscle pain. This medication may also increase your risk for jaw problems or a broken thigh bone. Tell your care team right away if you have severe pain in your jaw, bones, joints, or muscles. Tell you care team if you have any pain that does not go away or that gets worse. Tell your dentist and dental surgeon that you are taking this medication. You should not have major dental surgery while on this medication. See your dentist to have a dental exam and fix any dental problems before starting this medication. Take good care of your teeth while on this medication. Make sure you see your dentist for regular follow-up appointments. You should make sure you get enough calcium and vitamin D while you are taking this medication. Discuss the foods you eat and the vitamins you take with your care team. Check with your care team if you have severe diarrhea, nausea, and vomiting, or if you sweat a lot. The loss of too much body fluid may make it dangerous for you to take this medication. You may need bloodwork while taking this medication. Talk to your care team if   you wish to become pregnant or think you might be pregnant. This medication can cause serious birth defects. What side effects may I notice from receiving this medication? Side effects that you should report to your care team as soon as possible: Allergic reactions--skin rash, itching, hives, swelling of the face, lips, tongue, or  throat Kidney injury--decrease in the amount of urine, swelling of the ankles, hands, or feet Low calcium level--muscle pain or cramps, confusion, tingling, or numbness in the hands or feet Osteonecrosis of the jaw--pain, swelling, or redness in the mouth, numbness of the jaw, poor healing after dental work, unusual discharge from the mouth, visible bones in the mouth Severe bone, joint, or muscle pain Side effects that usually do not require medical attention (report to your care team if they continue or are bothersome): Constipation Fatigue Fever Loss of appetite Nausea Stomach pain This list may not describe all possible side effects. Call your doctor for medical advice about side effects. You may report side effects to FDA at 1-800-FDA-1088. Where should I keep my medication? This medication is given in a hospital or clinic. It will not be stored at home. NOTE: This sheet is a summary. It may not cover all possible information. If you have questions about this medicine, talk to your doctor, pharmacist, or health care provider.  2023 Elsevier/Gold Standard (2021-06-11 00:00:00) Pertuzumab Injection What is this medication? PERTUZUMAB (per TOOZ ue mab) treats breast cancer. It works by blocking a protein that causes cancer cells to grow and multiply. This helps to slow or stop the spread of cancer cells. It is a monoclonal antibody. This medicine may be used for other purposes; ask your health care provider or pharmacist if you have questions. COMMON BRAND NAME(S): PERJETA What should I tell my care team before I take this medication? They need to know if you have any of these conditions: Heart failure An unusual or allergic reaction to pertuzumab, other medications, foods, dyes, or preservatives Pregnant or trying to get pregnant Breast-feeding How should I use this medication? This medication is injected into a vein. It is given by your care team in a hospital or clinic  setting. Talk to your care team about the use of this medication in children. Special care may be needed. Overdosage: If you think you have taken too much of this medicine contact a poison control center or emergency room at once. NOTE: This medicine is only for you. Do not share this medicine with others. What if I miss a dose? Keep appointments for follow-up doses. It is important not to miss your dose. Call your care team if you are unable to keep an appointment. What may interact with this medication? Interactions are not expected. This list may not describe all possible interactions. Give your health care provider a list of all the medicines, herbs, non-prescription drugs, or dietary supplements you use. Also tell them if you smoke, drink alcohol, or use illegal drugs. Some items may interact with your medicine. What should I watch for while using this medication? Your condition will be monitored carefully while you are receiving this medication. This medication may make you feel generally unwell. This is not uncommon as chemotherapy can affect healthy cells as well as cancer cells. Report any side effects. Continue your course of treatment even though you feel ill unless your care team tells you to stop. Talk to your care team if you may be pregnant. Serious birth defects can occur if you take this medication during  pregnancy and for 7 months after the last dose. You will need a negative pregnancy test before starting this medication. Contraception is recommended while taking this medication and for 7 months after the last dose. Your care team can help you find the option that works for you. Do not breastfeed while taking this medication and for 7 months after the last dose. What side effects may I notice from receiving this medication? Side effects that you should report to your care team as soon as possible: Allergic reactions or angioedema--skin rash, itching or hives, swelling of the face,  eyes, lips, tongue, arms, or legs, trouble swallowing or breathing Heart failure--shortness of breath, swelling of the ankles, feet, or hands, sudden weight gain, unusual weakness or fatigue Infusion reactions--chest pain, shortness of breath or trouble breathing, feeling faint or lightheaded Side effects that usually do not require medical attention (report to your care team if they continue or are bothersome): Diarrhea Dry skin Fatigue Hair loss Nausea Vomiting This list may not describe all possible side effects. Call your doctor for medical advice about side effects. You may report side effects to FDA at 1-800-FDA-1088. Where should I keep my medication? This medication is given in a hospital or clinic. It will not be stored at home. NOTE: This sheet is a summary. It may not cover all possible information. If you have questions about this medicine, talk to your doctor, pharmacist, or health care provider.  2023 Elsevier/Gold Standard (2021-08-27 00:00:00) Trastuzumab Injection What is this medication? TRASTUZUMAB (tras TOO zoo mab) treats breast cancer and stomach cancer. It works by blocking a protein that causes cancer cells to grow and multiply. This helps to slow or stop the spread of cancer cells. This medicine may be used for other purposes; ask your health care provider or pharmacist if you have questions. COMMON BRAND NAME(S): Herceptin, Janae Bridgeman, Ontruzant, Trazimera What should I tell my care team before I take this medication? They need to know if you have any of these conditions: Heart failure Lung disease An unusual or allergic reaction to trastuzumab, other medications, foods, dyes, or preservatives Pregnant or trying to get pregnant Breast-feeding How should I use this medication? This medication is injected into a vein. It is given by your care team in a hospital or clinic setting. Talk to your care team about the use of this medication in children.  It is not approved for use in children. Overdosage: If you think you have taken too much of this medicine contact a poison control center or emergency room at once. NOTE: This medicine is only for you. Do not share this medicine with others. What if I miss a dose? Keep appointments for follow-up doses. It is important not to miss your dose. Call your care team if you are unable to keep an appointment. What may interact with this medication? Certain types of chemotherapy, such as daunorubicin, doxorubicin, epirubicin, idarubicin This list may not describe all possible interactions. Give your health care provider a list of all the medicines, herbs, non-prescription drugs, or dietary supplements you use. Also tell them if you smoke, drink alcohol, or use illegal drugs. Some items may interact with your medicine. What should I watch for while using this medication? Your condition will be monitored carefully while you are receiving this medication. This medication may make you feel generally unwell. This is not uncommon, as chemotherapy affects healthy cells as well as cancer cells. Report any side effects. Continue your course of treatment even  though you feel ill unless your care team tells you to stop. This medication may increase your risk of getting an infection. Call your care team for advice if you get a fever, chills, sore throat, or other symptoms of a cold or flu. Do not treat yourself. Try to avoid being around people who are sick. Avoid taking medications that contain aspirin, acetaminophen, ibuprofen, naproxen, or ketoprofen unless instructed by your care team. These medications can hide a fever. Talk to your care team if you may be pregnant. Serious birth defects can occur if you take this medication during pregnancy and for 7 months after the last dose. You will need a negative pregnancy test before starting this medication. Contraception is recommended while taking this medication and for 7  months after the last dose. Your care team can help you find the option that works for you. Do not breastfeed while taking this medication and for 7 months after stopping treatment. What side effects may I notice from receiving this medication? Side effects that you should report to your care team as soon as possible: Allergic reactions or angioedema--skin rash, itching or hives, swelling of the face, eyes, lips, tongue, arms, or legs, trouble swallowing or breathing Dry cough, shortness of breath or trouble breathing Heart failure--shortness of breath, swelling of the ankles, feet, or hands, sudden weight gain, unusual weakness or fatigue Infection--fever, chills, cough, or sore throat Infusion reactions--chest pain, shortness of breath or trouble breathing, feeling faint or lightheaded Side effects that usually do not require medical attention (report to your care team if they continue or are bothersome): Diarrhea Dizziness Headache Nausea Trouble sleeping Vomiting This list may not describe all possible side effects. Call your doctor for medical advice about side effects. You may report side effects to FDA at 1-800-FDA-1088. Where should I keep my medication? This medication is given in a hospital or clinic. It will not be stored at home. NOTE: This sheet is a summary. It may not cover all possible information. If you have questions about this medicine, talk to your doctor, pharmacist, or health care provider.  2023 Elsevier/Gold Standard (2021-09-08 00:00:00) Paclitaxel Injection What is this medication? PACLITAXEL (PAK li TAX el) treats some types of cancer. It works by slowing down the growth of cancer cells. This medicine may be used for other purposes; ask your health care provider or pharmacist if you have questions. COMMON BRAND NAME(S): Onxol, Taxol What should I tell my care team before I take this medication? They need to know if you have any of these conditions: Heart  disease Liver disease Low white blood cell levels An unusual or allergic reaction to paclitaxel, other medications, foods, dyes, or preservatives If you or your partner are pregnant or trying to get pregnant Breast-feeding How should I use this medication? This medication is injected into a vein. It is given by your care team in a hospital or clinic setting. Talk to your care team about the use of this medication in children. While it may be given to children for selected conditions, precautions do apply. Overdosage: If you think you have taken too much of this medicine contact a poison control center or emergency room at once. NOTE: This medicine is only for you. Do not share this medicine with others. What if I miss a dose? Keep appointments for follow-up doses. It is important not to miss your dose. Call your care team if you are unable to keep an appointment. What may interact with this medication?  Do not take this medication with any of the following: Live virus vaccines Other medications may affect the way this medication works. Talk with your care team about all of the medications you take. They may suggest changes to your treatment plan to lower the risk of side effects and to make sure your medications work as intended. This list may not describe all possible interactions. Give your health care provider a list of all the medicines, herbs, non-prescription drugs, or dietary supplements you use. Also tell them if you smoke, drink alcohol, or use illegal drugs. Some items may interact with your medicine. What should I watch for while using this medication? Your condition will be monitored carefully while you are receiving this medication. You may need blood work while taking this medication. This medication may make you feel generally unwell. This is not uncommon as chemotherapy can affect healthy cells as well as cancer cells. Report any side effects. Continue your course of treatment even  though you feel ill unless your care team tells you to stop. This medication can cause serious allergic reactions. To reduce the risk, your care team may give you other medications to take before receiving this one. Be sure to follow the directions from your care team. This medication may increase your risk of getting an infection. Call your care team for advice if you get a fever, chills, sore throat, or other symptoms of a cold or flu. Do not treat yourself. Try to avoid being around people who are sick. This medication may increase your risk to bruise or bleed. Call your care team if you notice any unusual bleeding. Be careful brushing or flossing your teeth or using a toothpick because you may get an infection or bleed more easily. If you have any dental work done, tell your dentist you are receiving this medication. Talk to your care team if you may be pregnant. Serious birth defects can occur if you take this medication during pregnancy. Talk to your care team before breastfeeding. Changes to your treatment plan may be needed. What side effects may I notice from receiving this medication? Side effects that you should report to your care team as soon as possible: Allergic reactions--skin rash, itching, hives, swelling of the face, lips, tongue, or throat Heart rhythm changes--fast or irregular heartbeat, dizziness, feeling faint or lightheaded, chest pain, trouble breathing Increase in blood pressure Infection--fever, chills, cough, sore throat, wounds that don't heal, pain or trouble when passing urine, general feeling of discomfort or being unwell Low blood pressure--dizziness, feeling faint or lightheaded, blurry vision Low red blood cell level--unusual weakness or fatigue, dizziness, headache, trouble breathing Painful swelling, warmth, or redness of the skin, blisters or sores at the infusion site Pain, tingling, or numbness in the hands or feet Slow heartbeat--dizziness, feeling faint or  lightheaded, confusion, trouble breathing, unusual weakness or fatigue Unusual bruising or bleeding Side effects that usually do not require medical attention (report to your care team if they continue or are bothersome): Diarrhea Hair loss Joint pain Loss of appetite Muscle pain Nausea Vomiting This list may not describe all possible side effects. Call your doctor for medical advice about side effects. You may report side effects to FDA at 1-800-FDA-1088. Where should I keep my medication? This medication is given in a hospital or clinic. It will not be stored at home. NOTE: This sheet is a summary. It may not cover all possible information. If you have questions about this medicine, talk to your doctor, pharmacist,  or health care provider.  2023 Elsevier/Gold Standard (2021-09-10 00:00:00)

## 2021-12-11 ENCOUNTER — Inpatient Hospital Stay: Payer: Medicare Other

## 2021-12-11 ENCOUNTER — Other Ambulatory Visit: Payer: Self-pay | Admitting: Oncology

## 2021-12-11 ENCOUNTER — Other Ambulatory Visit: Payer: Self-pay

## 2021-12-11 VITALS — BP 142/64 | HR 87 | Temp 97.9°F | Resp 18 | Ht 60.25 in | Wt 115.5 lb

## 2021-12-11 DIAGNOSIS — R600 Localized edema: Secondary | ICD-10-CM

## 2021-12-11 DIAGNOSIS — C787 Secondary malignant neoplasm of liver and intrahepatic bile duct: Secondary | ICD-10-CM

## 2021-12-11 DIAGNOSIS — Z5112 Encounter for antineoplastic immunotherapy: Secondary | ICD-10-CM | POA: Diagnosis not present

## 2021-12-11 DIAGNOSIS — C7951 Secondary malignant neoplasm of bone: Secondary | ICD-10-CM

## 2021-12-11 DIAGNOSIS — Z171 Estrogen receptor negative status [ER-]: Secondary | ICD-10-CM

## 2021-12-11 MED ORDER — PEGFILGRASTIM-CBQV 6 MG/0.6ML ~~LOC~~ SOSY
6.0000 mg | PREFILLED_SYRINGE | Freq: Once | SUBCUTANEOUS | Status: AC
  Administered 2021-12-11: 6 mg via SUBCUTANEOUS
  Filled 2021-12-11: qty 0.6

## 2021-12-11 NOTE — Patient Instructions (Signed)

## 2021-12-17 ENCOUNTER — Ambulatory Visit (INDEPENDENT_AMBULATORY_CARE_PROVIDER_SITE_OTHER): Payer: Medicare Other

## 2021-12-17 DIAGNOSIS — C787 Secondary malignant neoplasm of liver and intrahepatic bile duct: Secondary | ICD-10-CM | POA: Diagnosis not present

## 2021-12-17 DIAGNOSIS — R600 Localized edema: Secondary | ICD-10-CM | POA: Diagnosis not present

## 2021-12-17 LAB — ECHOCARDIOGRAM COMPLETE
Area-P 1/2: 3.77 cm2
P 1/2 time: 479 msec
S' Lateral: 2.3 cm

## 2021-12-19 ENCOUNTER — Encounter: Payer: Self-pay | Admitting: Oncology

## 2021-12-22 ENCOUNTER — Telehealth: Payer: Self-pay

## 2021-12-22 NOTE — Telephone Encounter (Signed)
-----   Message from [name], MD sent at 12/19/2021  7:34 PM EDT ----- Regarding: call Tell her heart test looks great

## 2021-12-22 NOTE — Telephone Encounter (Signed)
Patient notified

## 2021-12-29 ENCOUNTER — Inpatient Hospital Stay (INDEPENDENT_AMBULATORY_CARE_PROVIDER_SITE_OTHER): Payer: Medicare Other | Admitting: Hematology and Oncology

## 2021-12-29 ENCOUNTER — Other Ambulatory Visit: Payer: Self-pay | Admitting: Hematology and Oncology

## 2021-12-29 ENCOUNTER — Encounter: Payer: Self-pay | Admitting: Hematology and Oncology

## 2021-12-29 ENCOUNTER — Inpatient Hospital Stay: Payer: Medicare Other

## 2021-12-29 DIAGNOSIS — Z85038 Personal history of other malignant neoplasm of large intestine: Secondary | ICD-10-CM

## 2021-12-29 DIAGNOSIS — C50412 Malignant neoplasm of upper-outer quadrant of left female breast: Secondary | ICD-10-CM | POA: Diagnosis not present

## 2021-12-29 DIAGNOSIS — C50912 Malignant neoplasm of unspecified site of left female breast: Secondary | ICD-10-CM

## 2021-12-29 DIAGNOSIS — C7951 Secondary malignant neoplasm of bone: Secondary | ICD-10-CM

## 2021-12-29 DIAGNOSIS — C787 Secondary malignant neoplasm of liver and intrahepatic bile duct: Secondary | ICD-10-CM | POA: Diagnosis not present

## 2021-12-29 DIAGNOSIS — Z171 Estrogen receptor negative status [ER-]: Secondary | ICD-10-CM

## 2021-12-29 DIAGNOSIS — C7989 Secondary malignant neoplasm of other specified sites: Secondary | ICD-10-CM

## 2021-12-29 LAB — CBC AND DIFFERENTIAL
HCT: 36 (ref 36–46)
Hemoglobin: 12.5 (ref 12.0–16.0)
Neutrophils Absolute: 6.05
Platelets: 205 10*3/uL (ref 150–400)
WBC: 9.3

## 2021-12-29 LAB — HEPATIC FUNCTION PANEL
ALT: 18 U/L (ref 7–35)
AST: 33 (ref 13–35)
Alkaline Phosphatase: 73 (ref 25–125)
Bilirubin, Total: 0.7

## 2021-12-29 LAB — COMPREHENSIVE METABOLIC PANEL
Albumin: 3.6 (ref 3.5–5.0)
Calcium: 8.9 (ref 8.7–10.7)

## 2021-12-29 LAB — BASIC METABOLIC PANEL
BUN: 17 (ref 4–21)
CO2: 25 — AB (ref 13–22)
Chloride: 105 (ref 99–108)
Creatinine: 0.6 (ref 0.5–1.1)
Glucose: 109
Potassium: 4.6 mEq/L (ref 3.5–5.1)
Sodium: 133 — AB (ref 137–147)

## 2021-12-29 LAB — CBC: RBC: 3.77 — AB (ref 3.87–5.11)

## 2021-12-29 MED ORDER — CEPHALEXIN 500 MG PO CAPS
500.0000 mg | ORAL_CAPSULE | Freq: Two times a day (BID) | ORAL | 0 refills | Status: DC
Start: 2021-12-29 — End: 2022-01-20

## 2021-12-29 NOTE — Assessment & Plan Note (Signed)
Liver metastases discovered 04/17/21.  Numerous heterogeneously enhancing, internally necrotic lesions in the right hepatic lobe, consistent with metastatic disease. Largest lesion measures up to 6.7 cm and there are at least 6 lesions. We now have biopsy proven HER2 positive recurrent breast cancer with ER/PR negative. Sheis receiving THP and is tolerating it without significant difficulty. She had improvement in her liver function tests after 1 cycle, and they are now normal.  CT scan now shows excellent response with significant decrease in the size of her liver lesions.

## 2021-12-29 NOTE — Assessment & Plan Note (Signed)
Bone metastases discovered 04/17/21. Enhancing osseous lesions are present in the L2 vertebral body, both iliac bones, and in the sacrum, highly concerning for osseous metastases. She received her 1st dose of zoledronic acid on January 16th.  I once again today explained to her and her son the reasoning for this medicine.  Her widespread bone metastases show an interval increase in sclerosis of previously lytic lesions consistent with treatment response.

## 2021-12-29 NOTE — Assessment & Plan Note (Signed)
History of stage II colon cancer, diagnosed in May 2007. This was treated with surgical resection and adjuvant chemotherapy with Xeloda. Colonoscopy from 2013 revealed 2 polyps which were resected and chronic diverticulosis.

## 2021-12-29 NOTE — Assessment & Plan Note (Signed)
Stage IIB (T2c N1 M0) HER2 receptor positive left breast cancer, diagnosed in June 2020. This was ER/PR negative. This was treated with neoadjuvant Kadcyla/Perjeta and left mastectomy, with a complete response in breast and nodes. She had only received two doses of adjuvant Herceptin before being discontinued in December 2020. ?

## 2021-12-29 NOTE — Assessment & Plan Note (Addendum)
Left supraclavicular lymph node measuring 2-3 cm. Also involvement of subpectoral node and chest wall skin. This was rapidly progressive despite receiving  trastuzumab.We have added in Perjeta and Taxotere to her current regimen and this has been very effective.  The CT scan also shows good response of her adenopathy with resolution of the previous bulky left axillary and subpectoral adenopathy. Today, I do not appreciate axillary nodes. However, she does have a 2 cm nodule noted to the left chest that is new. This is located close to the left shoulder. Of note, she does have existing lymphedema to the left arm; however, this is worse today, most likely from a bee sting she suffered to the left hand. Her arm appears red and is warm to touch. We will treat this with antibiotics.  We will continue to watch this. We do have further treatment options if this proves to be true progression. We discussed this with both the patient and her son.

## 2021-12-29 NOTE — Progress Notes (Signed)
Patient Care Team: [name], MD as PCP - General (Family Medicine) [name], MD as Consulting Physician (Oncology)  Clinic Day:  12/29/2021  Referring physician: [name], MD  ASSESSMENT & PLAN:   Assessment & Plan: Malignant neoplasm of upper-outer quadrant of left breast in female, estrogen receptor negative ([place]) Stage IIB (T2c N1 M0) HER2 receptor positive left breast cancer, diagnosed in June 2020. This was ER/PR negative. This was treated with neoadjuvant Kadcyla/Perjeta and left mastectomy, with a complete response in breast and nodes. She had only received two doses of adjuvant Herceptin before being discontinued in December 2020.  History of colon cancer, stage II History of stage II colon cancer, diagnosed in May 2007. This was treated with surgical resection and adjuvant chemotherapy with Xeloda. Colonoscopy from 2013 revealed 2 polyps which were resected and chronic diverticulosis.  Malignant neoplasm metastatic to liver [facility]) Liver metastases discovered 04/17/21.  Numerous heterogeneously enhancing, internally necrotic lesions in the right hepatic lobe, consistent with metastatic disease. Largest lesion measures up to 6.7 cm and there are at least 6 lesions. We now have biopsy proven HER2 positive recurrent breast cancer with ER/PR negative. She is receiving THP and is tolerating it without significant difficulty. She had improvement in her liver function tests after 1 cycle, and they are now normal.  CT scan now shows excellent response with significant decrease in the size of her liver lesions.  Malignant neoplasm metastatic to bone [facility]) Bone metastases discovered 04/17/21. Enhancing osseous lesions are present in the L2 vertebral body, both iliac bones, and in the sacrum, highly concerning for osseous metastases. She received her 1st dose of zoledronic acid on January 16th.  I once again today explained to her and her son the reasoning for this medicine.   Her widespread bone metastases show an interval increase in sclerosis of previously lytic lesions consistent with treatment response.  Chest wall recurrence of breast cancer, left ([name]) Left supraclavicular lymph node measuring 2-3 cm. Also involvement of subpectoral node and chest wall skin. This was rapidly progressive despite receiving  trastuzumab.  We have added in Perjeta and Taxotere to her current regimen and this has been very effective.  The CT scan also shows good response of her adenopathy with resolution of the previous bulky left axillary and subpectoral adenopathy. Today, I do not appreciate axillary nodes. However, she does have a 2 cm nodule noted to the left chest that is new. This is located close to the left shoulder. Of note, she does have existing lymphedema to the left arm; however, this is worse today, most likely from a bee sting she suffered to the left hand. Her arm appears red and is warm to touch. We will treat this with antibiotics.  We will continue to watch this. We do have further treatment options if this proves to be true progression. We discussed this with both the patient and her son.    The patient understands the plans discussed today and is in agreement with them.  She knows to contact our office if she develops concerns prior to her next appointment.    Jasmin Ped, NP  [facility] [address] [place] [address] Dept: [phone] Dept Fax: [phone]   Orders Placed This Encounter  Procedures   CBC and differential    This external order was created through the Results Console.   CBC    This external order was created  through the Results Console.   Basic metabolic panel    This external order was created through the Results Console.   Comprehensive metabolic panel    This external order was created through the Results Console.   Hepatic function panel    This external  order was created through the Results Console.      CHIEF COMPLAINT:  CC: An 88 year old female with history of breast cancer here for 3 week evaluation  Current Treatment:  Trastuzumab, Pertuzumab, Docetaxel  INTERVAL HISTORY:  Jasmin Lewis is here today for repeat clinical assessment. She denies fevers or chills. She denies pain. Her appetite is good. Her weight has decreased 5 pounds over last 3 weeks .  I have reviewed the past medical history, past surgical history, social history and family history with the patient and they are unchanged from previous note.  ALLERGIES:  has No Known Allergies.  MEDICATIONS:  Current Outpatient Medications  Medication Sig Dispense Refill   dexamethasone (DECADRON) 4 MG tablet Take 5 tabs at the night before and 5 tab the morning of chemotherapy, every 3 weeks, by mouth (Patient not taking: Reported on 07/15/2021) 60 tablet 0   fish oil-omega-3 fatty acids 1000 MG capsule Take 1 g by mouth daily.     gabapentin (NEURONTIN) 100 MG capsule Take 1 capsule (100 mg total) by mouth at bedtime. 30 capsule 5   magnesium citrate SOLN Take 0.5 Bottles by mouth as needed for severe constipation.     NON FORMULARY Magic Mouth Wash 3 parts Maalox 2 parts Benadryl 1 part Viscous Lidocaine Disp: 6oz Instructions: 33m orally swish and swallow every 3-4 hours  (Called into [facility] 07/06/21     ondansetron (ZOFRAN) 4 MG tablet Take 1 tablet (4 mg total) by mouth every 8 (eight) hours as needed for nausea or vomiting. 20 tablet 2   oxyCODONE (OXY IR/ROXICODONE) 5 MG immediate release tablet Take 1 tablet (5 mg total) by mouth every 4 (four) hours as needed. 100 tablet 0   polyethylene glycol (MIRALAX / GLYCOLAX) 17 g packet Take 17 g by mouth as needed. constipation     senna (SENOKOT) 8.6 MG TABS tablet Take 1 tablet by mouth 2 (two) times daily.     No current facility-administered medications for this visit.    HISTORY OF PRESENT ILLNESS:   Oncology  History Overview Note  Cancer Staging Malignant neoplasm of upper-outer quadrant of left breast in female, estrogen receptor negative ([place] Staging form: Breast, AJCC 8th Edition - Clinical stage from 10/11/2018: Stage IIB (cT2, cN1, cM0, G3, ER-, PR-, HER2+) - Signed by [name] MD on 11/02/2018 - Clinical: No stage assigned - Unsigned    History of colon cancer, stage II  09/2005 Initial Diagnosis   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 Findings were a 5.9 x 3.8 x 1.2 cm moderate to well-differentiated adenocarcinoma penetrating the muscular wall. Forty lymph nodes negative. No vascular or lymphatic invasion. Multiple diverticula with microabscess formation and inflammation. Carcinoma present in at least 1 diverticulum. Preop CEA 5.2 with lab normal range 0 to 2.5. Preop CT scan with no obvious additional pathology.    2007 -  Chemotherapy   She received oral Xeloda chemotherapy as an adjuvant. She declined treatment on a clinical trial.    11/12/2011 Initial Diagnosis   [name], stage II   12/09/2011 Procedure  Followup colonoscopy done on 12/09/2011. She was found to have 2 polyps which were removed. Chronic diverticulosis.     Malignant neoplasm of upper-outer quadrant of left breast in female, estrogen receptor negative ([place])  09/26/2018 Mammogram   Mammogram/US of left breast 09/26/18 IMPRESSION:  1. 2.9cm irregular mass in the upper outer left breast corresponds to the palpable abnormality. This is highly suspicious for breast carcinoma.  2. Two adjacent abnormal left axillary  Lymph nodes suspicious for metastatic adenopathy. There is a third borderline abnormal left axillary LN with a cortex thickened to 64m.  3. Benign right breast cyst. No evidence of right breast malignancy.    10/11/2018 Cancer Staging   Staging form: Breast, AJCC 8th Edition - Clinical stage from  10/11/2018: Stage IIB (cT2, cN1, cM0, G3, ER-, PR-, HER2+) - Signed by [name] MD on 11/02/2018   10/11/2018 Initial Biopsy   Diagnosis 10/11/18 1. Breast, left, needle core biopsy, upper outer left 2 o'clock - INVASIVE DUCTAL CARCINOMA. - DUCTAL CARCINOMA IN SITU. - LYMPHOVASCULAR INVASION IS IDENTIFIED. - SEE COMMENT. 2. Lymph node, needle/core biopsy, left axilla - INVASIVE DUCTAL CARCINOMA. - SEE COMMENT.   10/11/2018 Receptors her2   The tumor cells are POSITIVE for Her2 (3+). Estrogen Receptor: 0%, NEGATIVE Progesterone Receptor: 0%, NEGATIVE Proliferation Marker Ki67: 70%   11/02/2018 Initial Diagnosis   Malignant neoplasm of upper-outer quadrant of left breast in female, estrogen receptor negative ([place]   11/15/2018 Breast MRI   MRI breast 11/15/18  IMPRESSION: 1. 3.3 centimeter mass in the LATERAL portion of the LEFT breast consistent with known malignancy. 2. There is significant non mass enhancement surrounding this mass and extending anteriorly into the nipple base, with largest diameter in the anterior to posterior axis, measuring 7.2 centimeters. 3. If the patient would consider breast conservation, additional MR guided core biopsies are recommended. Consider biopsy of the inferior and anterior extent of the non mass enhancement to document extent of disease. 4. Three enlarged LEFT axillary lymph nodes. 5. RIGHT breast is negative.   11/15/2018 PET scan   PET 11/15/18 IMPRESSION: Hypermetabolic left breast lesion compatible with known primary. Hypermetabolic left axillary lymph nodes are consistent with metastatic disease.   No evidence for additional hypermetabolic metastatic involvement in the neck, chest, abdomen, or pelvis.   11/17/2018 - 03/01/2019 Chemotherapy   Neo-adjuvant Kadcyla and perjeta q3weeks for 6 cycles starting 11/17/18. Stopped before surgery    11/29/2018 Pathology Results   Diagnosis 1. Breast, left, needle core biopsy, inferior anterior (cylinder  clip) - INVASIVE DUCTAL CARCINOMA. - DUCTAL CARCINOMA IN SITU. - LYMPHOVASCULAR INVASION IS IDENTIFIED. - SEE COMMENT. 2. Breast, left, needle core biopsy, central posterior (barbell clip) - INVASIVE DUCTAL CARCINOMA. - LYMPHOVASCULAR INVASION IS IDENTIFIED.   02/12/2019 Breast MRI   IMPRESSION: 1. Complete resolution of previously identified enhancing mass and associated non mass enhancement within the left breast. This is consistent with excellent response to chemotherapy. No residual or suspicious findings are identified. 2. No MRI evidence of malignancy on the right. 3. Previously identified left axillary lymphadenopathy not definitively seen on today's study. However, this may be due to decreased field-of-view compared to prior study.     03/14/2019 Cancer Staging   Staging form: Breast, AJCC 8th Edition - Pathologic stage from 03/14/2019: pT0, pN0, cM0, GX, ER: Unknown, PR: Unknown, HER2: Not Assessed - Signed by [name] MD on 03/28/2019   03/14/2019 Surgery   LEFT MASTECTOMY WITH TARGETED LYMPH NODE DISSECTION and Left Axillary Sentinel Lymph  Node Biopsy by Dr [name] 03/14/19    03/14/2019 Pathology Results   FINAL MICROSCOPIC DIAGNOSIS:   A. LYMPH NODE, LEFT, SENTINEL, BIOPSY:  - There is no evidence of carcinoma in 1 of 1 lymph node (0/1).   B. BREAST, LEFT, MASTECTOMY:  - Benign breast parenchyma with treatment-related changes.  - There is no evidence of malignancy.  - See oncology table below.   C. LYMPH NODE, LEFT #1, SENTINEL, BIOPSY:  - There is no evidence of carcinoma in 1 of 1 lymph node (0/1).   D. LYMPH NODE, LEFT #2, SENTINEL, BIOPSY:  - There is no evidence of carcinoma in 1 of 1 lymph node (0/1).     04/04/2019 - 04/25/2019 Chemotherapy   Maintenance Herceptin injections every 3 weeks starting 04/03/19 to complete 1 year of treatment that was started in 11/2018. Stopped after 2nd dose as she will not be repeating Echos.    06/03/2021 - 06/24/2021  Chemotherapy   Patient is on Treatment Plan : BREAST Trastuzumab q21d X 11 Cycles     06/25/2021 -  Chemotherapy   Patient is on Treatment Plan : BREAST Docetaxel + Trastuzumab + Pertuzumab (THP) q21d     09/14/2021 Imaging   CT chest/abdomen/pelvis  IMPRESSION:  1. Interval resolution of previously seen bulky left axillary and  subpectoral lymphadenopathy. No persistently enlarged lymph nodes.  2. Multiple liver lesions are significantly diminished in size.  3. Widespread osseous metastatic disease, with an interval increase  in sclerosis of several previously lytic lesions.  4. Constellation of findings is consistent with treatment response  of metastatic disease  5. New, although age indeterminate pathologic wedge deformity of the  T6 vertebral body as well as an increased pathologic wedge deformity  of the L2 vertebral body.  6. Coronary artery disease.  Aortic Atherosclerosis (ICD10-I70.0).    Malignant neoplasm metastatic to liver ([place])  04/17/2021 Imaging   CT ABDOMEN AND PELVIS WITH CONTRAST: -New lytic lesion involving the L2 vertebral body (6:72, 2:26) with minimal cortical breakthrough superiorly, but with preservation of vertebral body height, concerning for metastatic disease.  -There are several indeterminate hepatic lesions as described above. In addition to the lesions described above, there is an additional subtle 1.5 cm hypodensity in the hepatic dome (2:7). Given clinical history and presence of a new L2 lesion, leading differential consideration is metastatic disease. Recommend further evaluation with MRI of the abdomen with and without contrast.   05/12/2021 Imaging   MRI ABDOMEN WITH AND WITHOUT CONTRAST: Numerous heterogeneously enhancing, internally necrotic lesions in the right hepatic lobe, highly concerning for metastatic disease. Largest lesion measures up to 6.7 cm in hepatic segment VI.   Enhancing osseous lesions in the L2 vertebral body, both iliac bones,  and in the sacrum, highly concerning for osseous metastases.   05/15/2021 Initial Diagnosis   Liver metastases ([place])   05/27/2021 PET scan   1. Widespread recurrent/metastatic disease in this patient who is  status post bilateral mastectomy. Left chest wall recurrence with  left axillary/subpectoral, supraclavicular nodal metastasis.  2. Hepatic, left adrenal, and widespread osseous metastasis.  3. Incidental findings, including: Right nephrolithiasis. Tiny  hiatal hernia.    05/27/2021 Imaging   MRI LUMBAR AND THORACIC SPINE: Thoracic spine:  Osseous metastatic disease involving each level. The most dramatic deposits are at T4 and T6 where there is extraosseous/epidural tumor. No cord compression but there is foraminal impingement on the left at T6-7 and on the right at T3-4. Early dorsal epidural tumor at  the level of T10 and T3.   Lumbar spine:  1. Widespread osseous metastatic disease with largest deposit  replacing the L2 body where there is a compression fracture with  mild height loss. Also at this level is early epidural tumor  extension likely affecting both L2-3 foramina.  2. 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    05/27/2021 Imaging   MRI HEAD WITH AND WITHOUT CONTRAST: Negative for metastatic disease to the brain or calvarium.   06/03/2021 - 06/24/2021 Chemotherapy   Patient is on Treatment Plan : BREAST Trastuzumab q21d X 11 Cycles     06/25/2021 -  Chemotherapy   Patient is on Treatment Plan : BREAST Docetaxel + Trastuzumab + Pertuzumab (THP) q21d     09/14/2021 Imaging   CT chest/abdomen/pelvis  IMPRESSION:  1. Interval resolution of previously seen bulky left axillary and  subpectoral lymphadenopathy. No persistently enlarged lymph nodes.  2. Multiple liver lesions are significantly diminished in size.  3. Widespread osseous metastatic disease, with an interval increase  in sclerosis of several previously lytic lesions.  4.  Constellation of findings is consistent with treatment response  of metastatic disease  5. New, although age indeterminate pathologic wedge deformity of the  T6 vertebral body as well as an increased pathologic wedge deformity  of the L2 vertebral body.  6. Coronary artery disease.  Aortic Atherosclerosis (ICD10-I70.0).    Malignant neoplasm metastatic to bone ([place])  04/17/2021 Imaging   CT ABDOMEN AND PELVIS WITH CONTRAST: -New lytic lesion involving the L2 vertebral body (6:72, 2:26) with minimal cortical breakthrough superiorly, but with preservation of vertebral body height, concerning for metastatic disease.  -There are several indeterminate hepatic lesions as described above. In addition to the lesions described above, there is an additional subtle 1.5 cm hypodensity in the hepatic dome (2:7). Given clinical history and presence of a new L2 lesion, leading differential consideration is metastatic disease. Recommend further evaluation with MRI of the abdomen with and without contrast.   05/12/2021 Imaging   MRI ABDOMEN WITH AND WITHOUT CONTRAST: Numerous heterogeneously enhancing, internally necrotic lesions in the right hepatic lobe, highly concerning for metastatic disease. Largest lesion measures up to 6.7 cm in hepatic segment VI.   Enhancing osseous lesions in the L2 vertebral body, both iliac bones, and in the sacrum, highly concerning for osseous metastases.   05/15/2021 Initial Diagnosis   Bone metastases ([place])   05/27/2021 PET scan   1. Widespread recurrent/metastatic disease in this patient who is  status post bilateral mastectomy. Left chest wall recurrence with  left axillary/subpectoral, supraclavicular nodal metastasis.  2. Hepatic, left adrenal, and widespread osseous metastasis.  3. Incidental findings, including: Right nephrolithiasis. Tiny  hiatal hernia.    05/27/2021 Imaging   MRI LUMBAR AND THORACIC SPINE: Thoracic spine:  Osseous metastatic disease involving each  level. The most dramatic deposits are at T4 and T6 where there is extraosseous/epidural tumor. No cord compression but there is foraminal impingement on the left at T6-7 and on the right at T3-4. Early dorsal epidural tumor at the level of T10 and T3.   Lumbar spine:  1. Widespread osseous metastatic disease with largest deposit  replacing the L2 body where there is a compression fracture with  mild height loss. Also at this level is early epidural tumor  extension likely affecting both L2-3 foramina.  2. 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    05/27/2021 Imaging   MRI HEAD WITH AND WITHOUT CONTRAST:  Negative for metastatic disease to the brain or calvarium.   06/03/2021 - 06/24/2021 Chemotherapy   Patient is on Treatment Plan : BREAST Trastuzumab q21d X 11 Cycles     06/25/2021 -  Chemotherapy   Patient is on Treatment Plan : BREAST Docetaxel + Trastuzumab + Pertuzumab (THP) q21d     09/14/2021 Imaging   CT chest/abdomen/pelvis  IMPRESSION:  1. Interval resolution of previously seen bulky left axillary and  subpectoral lymphadenopathy. No persistently enlarged lymph nodes.  2. Multiple liver lesions are significantly diminished in size.  3. Widespread osseous metastatic disease, with an interval increase  in sclerosis of several previously lytic lesions.  4. Constellation of findings is consistent with treatment response  of metastatic disease  5. New, although age indeterminate pathologic wedge deformity of the  T6 vertebral body as well as an increased pathologic wedge deformity  of the L2 vertebral body.  6. Coronary artery disease.  Aortic Atherosclerosis (ICD10-I70.0).        REVIEW OF SYSTEMS:   Constitutional: Denies fevers, chills or abnormal weight loss Eyes: Denies blurriness of vision Ears, nose, mouth, throat, and face: Denies mucositis or sore throat Respiratory: Denies cough, dyspnea or wheezes Cardiovascular: Denies palpitation,  chest discomfort or lower extremity swelling Gastrointestinal:  Denies nausea, heartburn or change in bowel habits Skin: Denies abnormal skin rashes Lymphatics: Denies new lymphadenopathy or easy bruising Neurological:Denies numbness, tingling or new weaknesses Behavioral/Psych: Mood is stable, no new changes  All other systems were reviewed with the patient and are negative.   VITALS:  Blood pressure (!) 121/57, pulse 82, temperature 97.6 F (36.4 C), temperature source Oral, resp. rate 20, height 5' 0.25" (1.53 m), weight 110 lb 12.8 oz (50.3 kg), SpO2 95 %.  Wt Readings from Last 3 Encounters:  12/29/21 110 lb 12.8 oz (50.3 kg)  12/11/21 115 lb 8 oz (52.4 kg)  12/09/21 112 lb 1.6 oz (50.8 kg)    Body mass index is 21.46 kg/m.  Performance status (ECOG): 1 - Symptomatic but completely ambulatory  PHYSICAL EXAM:   GENERAL:alert, no distress and comfortable SKIN: skin color, texture, turgor are normal, no rashes or significant lesions EYES: normal, Conjunctiva are pink and non-injected, sclera clear OROPHARYNX:no exudate, no erythema and lips, buccal mucosa, and tongue normal  NECK: supple, thyroid normal size, non-tender, without nodularity LYMPH:  no palpable lymphadenopathy in the cervical, axillary or inguinal LUNGS: clear to auscultation and percussion with normal breathing effort HEART: regular rate & rhythm and no murmurs and no lower extremity edema ABDOMEN:abdomen soft, non-tender and normal bowel sounds Musculoskeletal:no cyanosis of digits and no clubbing  NEURO: alert & oriented x 3 with fluent speech, no focal motor/sensory deficits  LABORATORY DATA:  I have reviewed the data as listed    Component Value Date/Time   NA 133 (A) 12/29/2021 0000   NA 137 11/13/2012 1057   K 4.6 12/29/2021 0000   K 4.5 11/13/2012 1057   CL 105 12/29/2021 0000   CO2 25 (A) 12/29/2021 0000   CO2 26 11/13/2012 1057   GLUCOSE 104 (H) 04/25/2019 1045   GLUCOSE 90 11/13/2012 1057    BUN 17 12/29/2021 0000   BUN 18.3 11/13/2012 1057   CREATININE 0.6 12/29/2021 0000   CREATININE 0.90 04/25/2019 1045   CREATININE 0.8 11/13/2012 1057   CALCIUM 8.9 12/29/2021 0000   CALCIUM 9.7 11/13/2012 1057   PROT 7.4 04/25/2019 1045   PROT 7.4 11/13/2012 1057   ALBUMIN 3.6 12/29/2021 0000   ALBUMIN  3.9 11/13/2012 1057   AST 33 12/29/2021 0000   AST 41 04/25/2019 1045   AST 22 11/13/2012 1057   ALT 18 12/29/2021 0000   ALT 29 04/25/2019 1045   ALT 15 11/13/2012 1057   ALKPHOS 73 12/29/2021 0000   ALKPHOS 77 11/13/2012 1057   BILITOT 0.5 04/25/2019 1045   BILITOT 0.54 11/13/2012 1057   GFRNONAA 58 (L) 04/25/2019 1045   GFRAA >60 04/25/2019 1045    No results found for: "SPEP", "UPEP"  Lab Results  Component Value Date   WBC 9.3 12/29/2021   NEUTROABS 6.05 12/29/2021   HGB 12.5 12/29/2021   HCT 36 12/29/2021   MCV 98 10/28/2021   PLT 205 12/29/2021      Chemistry      Component Value Date/Time   NA 133 (A) 12/29/2021 0000   NA 137 11/13/2012 1057   K 4.6 12/29/2021 0000   K 4.5 11/13/2012 1057   CL 105 12/29/2021 0000   CO2 25 (A) 12/29/2021 0000   CO2 26 11/13/2012 1057   BUN 17 12/29/2021 0000   BUN 18.3 11/13/2012 1057   CREATININE 0.6 12/29/2021 0000   CREATININE 0.90 04/25/2019 1045   CREATININE 0.8 11/13/2012 1057   GLU 109 12/29/2021 0000      Component Value Date/Time   CALCIUM 8.9 12/29/2021 0000   CALCIUM 9.7 11/13/2012 1057   ALKPHOS 73 12/29/2021 0000   ALKPHOS 77 11/13/2012 1057   AST 33 12/29/2021 0000   AST 41 04/25/2019 1045   AST 22 11/13/2012 1057   ALT 18 12/29/2021 0000   ALT 29 04/25/2019 1045   ALT 15 11/13/2012 1057   BILITOT 0.5 04/25/2019 1045   BILITOT 0.54 11/13/2012 1057       RADIOGRAPHIC STUDIES: I have personally reviewed the radiological images as listed and agreed with the findings in the report. ECHOCARDIOGRAM COMPLETE  Result Date: 12/17/2021    ECHOCARDIOGRAM REPORT   Patient Name:   Jasmin Lewis Date  of Exam: 12/17/2021 Medical Rec #:  [id number]         Height:       60.2 in Accession #:    [id number]        Weight:       115.5 lb Date of Birth:  1932-05-04         BSA:          1.482 m Patient Age:    51 years          BP:           142/64 mmHg Patient Gender: F                 HR:           79 bpm. Exam Location:  [place] Procedure: 2D Echo, Cardiac Doppler, Color Doppler and Strain Analysis Indications:    Malignant neoplasm metastatic to liver ([name]) [C78.7                 (ICD-10-CM)]; Localized edema [R60.0 (ICD-10-CM)]  History:        Patient has prior history of Echocardiogram examinations, most                 recent 05/26/2021.  Sonographer:    Philipp Deputy RDCS Referring Phys: [place]  1. GLS -13.3. Left ventricular ejection fraction, by estimation, is 60 to 65%. The left ventricle has normal function. The left ventricle has no regional wall motion  abnormalities. Left ventricular diastolic parameters were normal.  2. Right ventricular systolic function is normal. The right ventricular size is normal. There is normal pulmonary artery systolic pressure.  3. The mitral valve is normal in structure. No evidence of mitral valve regurgitation. No evidence of mitral stenosis.  4. Tricuspid valve regurgitation is mild to moderate.  5. The aortic valve is normal in structure. Aortic valve regurgitation is mild. No aortic stenosis is present.  6. The inferior vena cava is normal in size with greater than 50% respiratory variability, suggesting right atrial pressure of 3 mmHg. FINDINGS  Left Ventricle: GLS -13.3. Left ventricular ejection fraction, by estimation, is 60 to 65%. The left ventricle has normal function. The left ventricle has no regional wall motion abnormalities. The left ventricular internal cavity size was normal in size. There is no left ventricular hypertrophy. Left ventricular diastolic parameters were normal. Right Ventricle: The right ventricular size is  normal. No increase in right ventricular wall thickness. Right ventricular systolic function is normal. There is normal pulmonary artery systolic pressure. The tricuspid regurgitant velocity is 2.35 m/s, and  with an assumed right atrial pressure of 3 mmHg, the estimated right ventricular systolic pressure is 54.2 mmHg. Left Atrium: Left atrial size was normal in size. Right Atrium: Right atrial size was normal in size. Pericardium: There is no evidence of pericardial effusion. Mitral Valve: The mitral valve is normal in structure. No evidence of mitral valve regurgitation. No evidence of mitral valve stenosis. Tricuspid Valve: The tricuspid valve is normal in structure. Tricuspid valve regurgitation is mild to moderate. No evidence of tricuspid stenosis. Aortic Valve: The aortic valve is normal in structure. Aortic valve regurgitation is mild. Aortic regurgitation PHT measures 479 msec. No aortic stenosis is present. Pulmonic Valve: The pulmonic valve was normal in structure. Pulmonic valve regurgitation is not visualized. No evidence of pulmonic stenosis. Aorta: The aortic root is normal in size and structure. Venous: The inferior vena cava is normal in size with greater than 50% respiratory variability, suggesting right atrial pressure of 3 mmHg. IAS/Shunts: No atrial level shunt detected by color flow Doppler.  LEFT VENTRICLE PLAX 2D LVIDd:         3.60 cm   Diastology LVIDs:         2.30 cm   LV e' medial:    10.10 cm/s LV PW:         1.00 cm   LV E/e' medial:  9.0 LV IVS:        1.00 cm   LV e' lateral:   11.20 cm/s LVOT diam:     1.70 cm   LV E/e' lateral: 8.1 LV SV:         59 LV SV Index:   40 LVOT Area:     2.27 cm  RIGHT VENTRICLE             IVC RV Basal diam:  3.30 cm     IVC diam: 1.50 cm RV Mid diam:    2.60 cm RV S prime:     16.20 cm/s TAPSE (M-mode): 3.0 cm LEFT ATRIUM             Index        RIGHT ATRIUM           Index LA diam:        2.90 cm 1.96 cm/m   RA Area:     17.50 cm LA Vol (A2C):    27.7 ml 18.69 ml/m  RA Volume:   42.30 ml  28.54 ml/m LA Vol (A4C):   44.5 ml 30.02 ml/m LA Biplane Vol: 36.6 ml 24.69 ml/m  AORTIC VALVE             PULMONIC VALVE LVOT Vmax:   128.00 cm/s PR End Diast Vel: 3.51 msec LVOT Vmean:  84.300 cm/s LVOT VTI:    0.258 m AI PHT:      479 msec  AORTA Ao Root diam: 2.60 cm Ao Asc diam:  2.90 cm Ao Desc diam: 1.70 cm MITRAL VALVE                TRICUSPID VALVE MV Area (PHT): 3.77 cm     TR Peak grad:   22.1 mmHg MV Decel Time: 201 msec     TR Vmax:        235.00 cm/s MV E velocity: 90.60 cm/s MV A velocity: 106.00 cm/s  SHUNTS MV E/A ratio:  0.85         Systemic VTI:  0.26 m                             Systemic Diam: 1.70 cm Jenne Campus MD Electronically signed by Jenne Campus MD Signature Date/Time: 12/17/2021/3:00:52 PM    Final

## 2021-12-30 ENCOUNTER — Other Ambulatory Visit: Payer: Self-pay

## 2021-12-30 MED FILL — Trastuzumab-dkst For IV Soln 150 MG: INTRAVENOUS | Qty: 14.3 | Status: AC

## 2021-12-30 MED FILL — Pertuzumab Soln for IV Infusion 420 MG/14ML (30 MG/ML): INTRAVENOUS | Qty: 14 | Status: AC

## 2021-12-30 MED FILL — Docetaxel Soln for IV Infusion 160 MG/16ML: INTRAVENOUS | Qty: 9 | Status: AC

## 2021-12-30 MED FILL — Dexamethasone Sodium Phosphate Inj 100 MG/10ML: INTRAMUSCULAR | Qty: 1 | Status: AC

## 2021-12-31 ENCOUNTER — Inpatient Hospital Stay: Payer: Medicare Other

## 2021-12-31 VITALS — BP 175/74 | HR 90 | Temp 97.4°F | Resp 16 | Wt 109.3 lb

## 2021-12-31 DIAGNOSIS — Z5112 Encounter for antineoplastic immunotherapy: Secondary | ICD-10-CM | POA: Diagnosis not present

## 2021-12-31 DIAGNOSIS — C7951 Secondary malignant neoplasm of bone: Secondary | ICD-10-CM

## 2021-12-31 DIAGNOSIS — C787 Secondary malignant neoplasm of liver and intrahepatic bile duct: Secondary | ICD-10-CM

## 2021-12-31 DIAGNOSIS — C50412 Malignant neoplasm of upper-outer quadrant of left female breast: Secondary | ICD-10-CM

## 2021-12-31 DIAGNOSIS — Z171 Estrogen receptor negative status [ER-]: Secondary | ICD-10-CM

## 2021-12-31 MED ORDER — SODIUM CHLORIDE 0.9 % IV SOLN: Freq: Once | INTRAVENOUS | Status: AC

## 2021-12-31 MED ORDER — SODIUM CHLORIDE 0.9 % IV SOLN
10.0000 mg | Freq: Once | INTRAVENOUS | Status: AC
  Administered 2021-12-31: 10 mg via INTRAVENOUS
  Filled 2021-12-31: qty 10

## 2021-12-31 MED ORDER — ACETAMINOPHEN 325 MG PO TABS
650.0000 mg | ORAL_TABLET | Freq: Once | ORAL | Status: AC
  Administered 2021-12-31: 650 mg via ORAL
  Filled 2021-12-31: qty 2

## 2021-12-31 MED ORDER — HEPARIN SOD (PORK) LOCK FLUSH 100 UNIT/ML IV SOLN
500.0000 [IU] | Freq: Once | INTRAVENOUS | Status: AC | PRN
  Administered 2021-12-31: 500 [IU]

## 2021-12-31 MED ORDER — DIPHENHYDRAMINE HCL 25 MG PO CAPS
25.0000 mg | ORAL_CAPSULE | Freq: Once | ORAL | Status: AC
  Administered 2021-12-31: 25 mg via ORAL
  Filled 2021-12-31: qty 1

## 2021-12-31 MED ORDER — SODIUM CHLORIDE 0.9% FLUSH
10.0000 mL | INTRAVENOUS | Status: DC | PRN
  Administered 2021-12-31: 10 mL

## 2021-12-31 MED ORDER — TRASTUZUMAB-DKST CHEMO 150 MG IV SOLR
300.0000 mg | Freq: Once | INTRAVENOUS | Status: AC
  Administered 2021-12-31: 300 mg via INTRAVENOUS
  Filled 2021-12-31: qty 14.29

## 2021-12-31 MED ORDER — SODIUM CHLORIDE 0.9 % IV SOLN
420.0000 mg | Freq: Once | INTRAVENOUS | Status: AC
  Administered 2021-12-31: 420 mg via INTRAVENOUS
  Filled 2021-12-31: qty 14

## 2021-12-31 MED ORDER — SODIUM CHLORIDE 0.9 % IV SOLN
60.0000 mg/m2 | Freq: Once | INTRAVENOUS | Status: AC
  Administered 2021-12-31: 90 mg via INTRAVENOUS
  Filled 2021-12-31: qty 9

## 2021-12-31 NOTE — Patient Instructions (Signed)
Paclitaxel Injection What is this medication? PACLITAXEL (PAK li TAX el) treats some types of cancer. It works by slowing down the growth of cancer cells. This medicine may be used for other purposes; ask your health care provider or pharmacist if you have questions. COMMON BRAND NAME(S): Onxol, Taxol What should I tell my care team before I take this medication? They need to know if you have any of these conditions: Heart disease Liver disease Low white blood cell levels An unusual or allergic reaction to paclitaxel, other medications, foods, dyes, or preservatives If you or your partner are pregnant or trying to get pregnant Breast-feeding How should I use this medication? This medication is injected into a vein. It is given by your care team in a hospital or clinic setting. Talk to your care team about the use of this medication in children. While it may be given to children for selected conditions, precautions do apply. Overdosage: If you think you have taken too much of this medicine contact a poison control center or emergency room at once. NOTE: This medicine is only for you. Do not share this medicine with others. What if I miss a dose? Keep appointments for follow-up doses. It is important not to miss your dose. Call your care team if you are unable to keep an appointment. What may interact with this medication? Do not take this medication with any of the following: Live virus vaccines Other medications may affect the way this medication works. Talk with your care team about all of the medications you take. They may suggest changes to your treatment plan to lower the risk of side effects and to make sure your medications work as intended. This list may not describe all possible interactions. Give your health care provider a list of all the medicines, herbs, non-prescription drugs, or dietary supplements you use. Also tell them if you smoke, drink alcohol, or use illegal drugs. Some  items may interact with your medicine. What should I watch for while using this medication? Your condition will be monitored carefully while you are receiving this medication. You may need blood work while taking this medication. This medication may make you feel generally unwell. This is not uncommon as chemotherapy can affect healthy cells as well as cancer cells. Report any side effects. Continue your course of treatment even though you feel ill unless your care team tells you to stop. This medication can cause serious allergic reactions. To reduce the risk, your care team may give you other medications to take before receiving this one. Be sure to follow the directions from your care team. This medication may increase your risk of getting an infection. Call your care team for advice if you get a fever, chills, sore throat, or other symptoms of a cold or flu. Do not treat yourself. Try to avoid being around people who are sick. This medication may increase your risk to bruise or bleed. Call your care team if you notice any unusual bleeding. Be careful brushing or flossing your teeth or using a toothpick because you may get an infection or bleed more easily. If you have any dental work done, tell your dentist you are receiving this medication. Talk to your care team if you may be pregnant. Serious birth defects can occur if you take this medication during pregnancy. Talk to your care team before breastfeeding. Changes to your treatment plan may be needed. What side effects may I notice from receiving this medication? Side effects that you   should report to your care team as soon as possible: Allergic reactions--skin rash, itching, hives, swelling of the face, lips, tongue, or throat Heart rhythm changes--fast or irregular heartbeat, dizziness, feeling faint or lightheaded, chest pain, trouble breathing Increase in blood pressure Infection--fever, chills, cough, sore throat, wounds that don't heal,  pain or trouble when passing urine, general feeling of discomfort or being unwell Low blood pressure--dizziness, feeling faint or lightheaded, blurry vision Low red blood cell level--unusual weakness or fatigue, dizziness, headache, trouble breathing Painful swelling, warmth, or redness of the skin, blisters or sores at the infusion site Pain, tingling, or numbness in the hands or feet Slow heartbeat--dizziness, feeling faint or lightheaded, confusion, trouble breathing, unusual weakness or fatigue Unusual bruising or bleeding Side effects that usually do not require medical attention (report to your care team if they continue or are bothersome): Diarrhea Hair loss Joint pain Loss of appetite Muscle pain Nausea Vomiting This list may not describe all possible side effects. Call your doctor for medical advice about side effects. You may report side effects to FDA at 1-800-FDA-1088. Where should I keep my medication? This medication is given in a hospital or clinic. It will not be stored at home. NOTE: This sheet is a summary. It may not cover all possible information. If you have questions about this medicine, talk to your doctor, pharmacist, or health care provider.  2023 Elsevier/Gold Standard (2021-09-10 00:00:00) Pertuzumab Injection What is this medication? PERTUZUMAB (per TOOZ ue mab) treats breast cancer. It works by blocking a protein that causes cancer cells to grow and multiply. This helps to slow or stop the spread of cancer cells. It is a monoclonal antibody. This medicine may be used for other purposes; ask your health care provider or pharmacist if you have questions. COMMON BRAND NAME(S): PERJETA What should I tell my care team before I take this medication? They need to know if you have any of these conditions: Heart failure An unusual or allergic reaction to pertuzumab, other medications, foods, dyes, or preservatives Pregnant or trying to get  pregnant Breast-feeding How should I use this medication? This medication is injected into a vein. It is given by your care team in a hospital or clinic setting. Talk to your care team about the use of this medication in children. Special care may be needed. Overdosage: If you think you have taken too much of this medicine contact a poison control center or emergency room at once. NOTE: This medicine is only for you. Do not share this medicine with others. What if I miss a dose? Keep appointments for follow-up doses. It is important not to miss your dose. Call your care team if you are unable to keep an appointment. What may interact with this medication? Interactions are not expected. This list may not describe all possible interactions. Give your health care provider a list of all the medicines, herbs, non-prescription drugs, or dietary supplements you use. Also tell them if you smoke, drink alcohol, or use illegal drugs. Some items may interact with your medicine. What should I watch for while using this medication? Your condition will be monitored carefully while you are receiving this medication. This medication may make you feel generally unwell. This is not uncommon as chemotherapy can affect healthy cells as well as cancer cells. Report any side effects. Continue your course of treatment even though you feel ill unless your care team tells you to stop. Talk to your care team if you may be pregnant. Serious  birth defects can occur if you take this medication during pregnancy and for 7 months after the last dose. You will need a negative pregnancy test before starting this medication. Contraception is recommended while taking this medication and for 7 months after the last dose. Your care team can help you find the option that works for you. Do not breastfeed while taking this medication and for 7 months after the last dose. What side effects may I notice from receiving this medication? Side  effects that you should report to your care team as soon as possible: Allergic reactions or angioedema--skin rash, itching or hives, swelling of the face, eyes, lips, tongue, arms, or legs, trouble swallowing or breathing Heart failure--shortness of breath, swelling of the ankles, feet, or hands, sudden weight gain, unusual weakness or fatigue Infusion reactions--chest pain, shortness of breath or trouble breathing, feeling faint or lightheaded Side effects that usually do not require medical attention (report to your care team if they continue or are bothersome): Diarrhea Dry skin Fatigue Hair loss Nausea Vomiting This list may not describe all possible side effects. Call your doctor for medical advice about side effects. You may report side effects to FDA at 1-800-FDA-1088. Where should I keep my medication? This medication is given in a hospital or clinic. It will not be stored at home. NOTE: This sheet is a summary. It may not cover all possible information. If you have questions about this medicine, talk to your doctor, pharmacist, or health care provider.  2023 Elsevier/Gold Standard (2021-08-27 00:00:00) Trastuzumab Injection What is this medication? TRASTUZUMAB (tras TOO zoo mab) treats breast cancer and stomach cancer. It works by blocking a protein that causes cancer cells to grow and multiply. This helps to slow or stop the spread of cancer cells. This medicine may be used for other purposes; ask your health care provider or pharmacist if you have questions. COMMON BRAND NAME(S): Herceptin, Janae Bridgeman, Ontruzant, Trazimera What should I tell my care team before I take this medication? They need to know if you have any of these conditions: Heart failure Lung disease An unusual or allergic reaction to trastuzumab, other medications, foods, dyes, or preservatives Pregnant or trying to get pregnant Breast-feeding How should I use this medication? This medication is  injected into a vein. It is given by your care team in a hospital or clinic setting. Talk to your care team about the use of this medication in children. It is not approved for use in children. Overdosage: If you think you have taken too much of this medicine contact a poison control center or emergency room at once. NOTE: This medicine is only for you. Do not share this medicine with others. What if I miss a dose? Keep appointments for follow-up doses. It is important not to miss your dose. Call your care team if you are unable to keep an appointment. What may interact with this medication? Certain types of chemotherapy, such as daunorubicin, doxorubicin, epirubicin, idarubicin This list may not describe all possible interactions. Give your health care provider a list of all the medicines, herbs, non-prescription drugs, or dietary supplements you use. Also tell them if you smoke, drink alcohol, or use illegal drugs. Some items may interact with your medicine. What should I watch for while using this medication? Your condition will be monitored carefully while you are receiving this medication. This medication may make you feel generally unwell. This is not uncommon, as chemotherapy affects healthy cells as well as cancer cells.  Report any side effects. Continue your course of treatment even though you feel ill unless your care team tells you to stop. This medication may increase your risk of getting an infection. Call your care team for advice if you get a fever, chills, sore throat, or other symptoms of a cold or flu. Do not treat yourself. Try to avoid being around people who are sick. Avoid taking medications that contain aspirin, acetaminophen, ibuprofen, naproxen, or ketoprofen unless instructed by your care team. These medications can hide a fever. Talk to your care team if you may be pregnant. Serious birth defects can occur if you take this medication during pregnancy and for 7 months after  the last dose. You will need a negative pregnancy test before starting this medication. Contraception is recommended while taking this medication and for 7 months after the last dose. Your care team can help you find the option that works for you. Do not breastfeed while taking this medication and for 7 months after stopping treatment. What side effects may I notice from receiving this medication? Side effects that you should report to your care team as soon as possible: Allergic reactions or angioedema--skin rash, itching or hives, swelling of the face, eyes, lips, tongue, arms, or legs, trouble swallowing or breathing Dry cough, shortness of breath or trouble breathing Heart failure--shortness of breath, swelling of the ankles, feet, or hands, sudden weight gain, unusual weakness or fatigue Infection--fever, chills, cough, or sore throat Infusion reactions--chest pain, shortness of breath or trouble breathing, feeling faint or lightheaded Side effects that usually do not require medical attention (report to your care team if they continue or are bothersome): Diarrhea Dizziness Headache Nausea Trouble sleeping Vomiting This list may not describe all possible side effects. Call your doctor for medical advice about side effects. You may report side effects to FDA at 1-800-FDA-1088. Where should I keep my medication? This medication is given in a hospital or clinic. It will not be stored at home. NOTE: This sheet is a summary. It may not cover all possible information. If you have questions about this medicine, talk to your doctor, pharmacist, or health care provider.  2023 Elsevier/Gold Standard (2021-09-08 00:00:00)

## 2022-01-01 ENCOUNTER — Inpatient Hospital Stay: Payer: Medicare Other

## 2022-01-01 VITALS — BP 154/51 | HR 94 | Temp 97.9°F | Resp 18

## 2022-01-01 DIAGNOSIS — Z5112 Encounter for antineoplastic immunotherapy: Secondary | ICD-10-CM | POA: Diagnosis not present

## 2022-01-01 DIAGNOSIS — C7951 Secondary malignant neoplasm of bone: Secondary | ICD-10-CM

## 2022-01-01 DIAGNOSIS — C787 Secondary malignant neoplasm of liver and intrahepatic bile duct: Secondary | ICD-10-CM

## 2022-01-01 DIAGNOSIS — Z171 Estrogen receptor negative status [ER-]: Secondary | ICD-10-CM

## 2022-01-01 MED ORDER — PEGFILGRASTIM-CBQV 6 MG/0.6ML ~~LOC~~ SOSY
6.0000 mg | PREFILLED_SYRINGE | Freq: Once | SUBCUTANEOUS | Status: AC
  Administered 2022-01-01: 6 mg via SUBCUTANEOUS
  Filled 2022-01-01: qty 0.6

## 2022-01-01 NOTE — Patient Instructions (Signed)

## 2022-01-02 ENCOUNTER — Encounter: Payer: Self-pay | Admitting: Oncology

## 2022-01-06 ENCOUNTER — Telehealth: Payer: Self-pay | Admitting: Dietician

## 2022-01-06 NOTE — Telephone Encounter (Signed)
Patient screened on [facility]. Patient reports she feels good at current weight.  She states her weight often fluctuated 5#s. Her cardiologist is happy with her current weight and she'd like to stay around 110#.  Her appetite is good and her son is living with her right now and he is making sure she has lots of healthy foods available.  She doesn't feel she has any concerns or questions or need any nutritional resources at this time.  April Manson, RDN, LDN Registered Dietitian, [place] [employer] Time Remote (Usual office hours: Tuesday-Thursday) Cell: [phone]

## 2022-01-10 ENCOUNTER — Other Ambulatory Visit: Payer: Self-pay | Admitting: Pharmacist

## 2022-01-10 DIAGNOSIS — C7951 Secondary malignant neoplasm of bone: Secondary | ICD-10-CM

## 2022-01-10 DIAGNOSIS — C50412 Malignant neoplasm of upper-outer quadrant of left female breast: Secondary | ICD-10-CM

## 2022-01-10 DIAGNOSIS — C787 Secondary malignant neoplasm of liver and intrahepatic bile duct: Secondary | ICD-10-CM

## 2022-01-20 ENCOUNTER — Inpatient Hospital Stay: Payer: Medicare Other | Attending: Oncology | Admitting: Hematology and Oncology

## 2022-01-20 ENCOUNTER — Inpatient Hospital Stay: Payer: Medicare Other | Admitting: Hematology and Oncology

## 2022-01-20 ENCOUNTER — Encounter: Payer: Self-pay | Admitting: Oncology

## 2022-01-20 ENCOUNTER — Inpatient Hospital Stay (INDEPENDENT_AMBULATORY_CARE_PROVIDER_SITE_OTHER): Payer: Medicare Other | Admitting: Oncology

## 2022-01-20 VITALS — BP 135/65 | HR 80 | Temp 98.1°F | Resp 17 | Ht 60.25 in | Wt 111.1 lb

## 2022-01-20 DIAGNOSIS — Z171 Estrogen receptor negative status [ER-]: Secondary | ICD-10-CM

## 2022-01-20 DIAGNOSIS — I10 Essential (primary) hypertension: Secondary | ICD-10-CM | POA: Insufficient documentation

## 2022-01-20 DIAGNOSIS — C787 Secondary malignant neoplasm of liver and intrahepatic bile duct: Secondary | ICD-10-CM

## 2022-01-20 DIAGNOSIS — C50412 Malignant neoplasm of upper-outer quadrant of left female breast: Secondary | ICD-10-CM | POA: Diagnosis not present

## 2022-01-20 DIAGNOSIS — C7951 Secondary malignant neoplasm of bone: Secondary | ICD-10-CM

## 2022-01-20 DIAGNOSIS — Z5112 Encounter for antineoplastic immunotherapy: Secondary | ICD-10-CM | POA: Insufficient documentation

## 2022-01-20 DIAGNOSIS — Z85038 Personal history of other malignant neoplasm of large intestine: Secondary | ICD-10-CM | POA: Insufficient documentation

## 2022-01-20 DIAGNOSIS — C50912 Malignant neoplasm of unspecified site of left female breast: Secondary | ICD-10-CM

## 2022-01-20 DIAGNOSIS — Z9012 Acquired absence of left breast and nipple: Secondary | ICD-10-CM | POA: Insufficient documentation

## 2022-01-20 DIAGNOSIS — I890 Lymphedema, not elsewhere classified: Secondary | ICD-10-CM | POA: Insufficient documentation

## 2022-01-20 DIAGNOSIS — C7989 Secondary malignant neoplasm of other specified sites: Secondary | ICD-10-CM

## 2022-01-20 DIAGNOSIS — Z9221 Personal history of antineoplastic chemotherapy: Secondary | ICD-10-CM | POA: Insufficient documentation

## 2022-01-20 DIAGNOSIS — Z853 Personal history of malignant neoplasm of breast: Secondary | ICD-10-CM | POA: Insufficient documentation

## 2022-01-20 LAB — BASIC METABOLIC PANEL
BUN: 19 (ref 4–21)
CO2: 24 — AB (ref 13–22)
Chloride: 107 (ref 99–108)
Creatinine: 0.5 (ref 0.5–1.1)
Glucose: 94
Potassium: 4 mEq/L (ref 3.5–5.1)
Sodium: 134 — AB (ref 137–147)

## 2022-01-20 LAB — CBC: RBC: 3.71 — AB (ref 3.87–5.11)

## 2022-01-20 LAB — HEPATIC FUNCTION PANEL
ALT: 19 U/L (ref 7–35)
AST: 34 (ref 13–35)
Alkaline Phosphatase: 65 (ref 25–125)
Bilirubin, Total: 0.6

## 2022-01-20 LAB — CBC AND DIFFERENTIAL
HCT: 36 (ref 36–46)
Hemoglobin: 12.2 (ref 12.0–16.0)
Neutrophils Absolute: 8.91
Platelets: 202 10*3/uL (ref 150–400)
WBC: 12.2

## 2022-01-20 LAB — COMPREHENSIVE METABOLIC PANEL
Albumin: 3.6 (ref 3.5–5.0)
Calcium: 9 (ref 8.7–10.7)

## 2022-01-20 MED FILL — Trastuzumab-dkst For IV Soln 150 MG: INTRAVENOUS | Qty: 14.3 | Status: AC

## 2022-01-20 MED FILL — Pertuzumab Soln for IV Infusion 420 MG/14ML (30 MG/ML): INTRAVENOUS | Qty: 14 | Status: AC

## 2022-01-20 MED FILL — Docetaxel Soln for IV Infusion 160 MG/16ML: INTRAVENOUS | Qty: 9 | Status: AC

## 2022-01-20 MED FILL — Zoledronic Acid Inj Conc For IV Infusion 4 MG/5ML: INTRAVENOUS | Qty: 3.75 | Status: AC

## 2022-01-20 MED FILL — Dexamethasone Sodium Phosphate Inj 100 MG/10ML: INTRAMUSCULAR | Qty: 1 | Status: AC

## 2022-01-20 NOTE — Progress Notes (Signed)
Patient Care Team: [name], MD as PCP - General (Family Medicine) [name], MD as Consulting Physician (Oncology)  Clinic Day: 01/20/22  Referring physician: [name], MD  ASSESSMENT & PLAN:   Assessment & Plan: Malignant neoplasm of upper-outer quadrant of left breast in female, estrogen receptor negative ([place]) Stage IIB (T2c N1 M0) HER2 receptor positive left breast cancer, diagnosed in June 2020. This was ER/PR negative. This was treated with neoadjuvant Kadcyla/Perjeta and left mastectomy, with a complete response in breast and nodes. She had only received two doses of adjuvant Herceptin before being discontinued in December 2020.   History of colon cancer, stage II History of stage II colon cancer, diagnosed in May 2007. This was treated with surgical resection and adjuvant chemotherapy with Xeloda. Colonoscopy from 2013 revealed 2 polyps which were resected and chronic diverticulosis.   Malignant neoplasm metastatic to liver [facility]) Liver metastases discovered 04/17/21.  Numerous heterogeneously enhancing, internally necrotic lesions in the right hepatic lobe, consistent with metastatic disease. Largest lesion measures up to 6.7 cm and there are at least 6 lesions. We now have biopsy proven HER2 positive recurrent breast cancer with ER/PR negative. She is receiving THP and is tolerating it without significant difficulty. She had improvement in her liver function tests after 1 cycle, and they are now normal.  CT scan showed excellent response with significant decrease in the size of her liver lesions.   Malignant neoplasm metastatic to bone [facility]) Bone metastases discovered 04/17/21. Enhancing osseous lesions are present in the L2 vertebral body, both iliac bones, and in the sacrum, highly concerning for osseous metastases. She received her 1st dose of zoledronic acid on January 16th.  I once again today explained to her and her son the reasoning for this medicine.  Her  widespread bone metastases show an interval increase in sclerosis of previously lytic lesions consistent with treatment response.   Chest wall recurrence of breast cancer, left ([name]) Left supraclavicular lymph node measuring 2-3 cm. Also involvement of subpectoral node and chest wall skin. This was rapidly progressive despite receiving  trastuzumab.  We have added in Perjeta and Taxotere to her current regimen and this has been very effective.  The CT scan also shows good response of her adenopathy with resolution of the previous bulky left axillary and subpectoral adenopathy. Today, I do not appreciate axillary nodes. However, she had a 2 cm nodule noted to the left chest at her last visit.. This is located close to the left shoulder and now appears to be obvious progression of disease again.   Lymphedema She does have existing lymphedema to the left arm; however, this is worse today, with significant edema of the hand as well. We will refer her to a lymphedema specialist and she requests one in [place], so we will arrange that. She sleeps with the hand and arm elevated. She has decreased use of her left hand but I think this is due to more than just the lymphedema.   She has definite progression of disease today and so we will stop the current treatment. We had a discussion as to how much further aggressive treatment she wishes, along with her son Suezanne Jacquet. She feels she would like to try something else if it has a reasonable chance of helping her, but is not sure how much more she will want to do this.  I have recommended Enhertu at reduced doses and I am hopeful this will be effective. I just don't know how  much toxicity she would have. We could get this started next week, to give [place] time to get authorization and chemotherapy education.  I would then see her 10 days later to see how she tolerated the first dose. I reviewed the schedule and potential toxicities, and answered their questions. We will also  work on the lymphedema referral.  The patient understands the plans discussed today and is in agreement with them.  She knows to contact our office if she develops concerns prior to her next appointment.    [name], MD  [facility] AT [facility] [address] [place] [address] Dept: [phone] Dept Fax: [phone]   Orders Placed This Encounter  Procedures   CBC and differential    This external order was created through the Results Console.   CBC    This external order was created through the Results Console.   Basic metabolic panel    This external order was created through the Results Console.   Comprehensive metabolic panel    This external order was created through the Results Console.   Hepatic function panel    This external order was created through the Results Console.   Ambulatory referral to Occupational Therapy    Referral Priority:   Routine    Referral Type:   Occupational Therapy    Referral Reason:   Specialty Services Required    Requested Specialty:   Occupational Therapy    Number of Visits Requested:   1      CHIEF COMPLAINT:  CC: An 88 year old female with metastatic breast cancer   Current Treatment:  Planning Enhertu  INTERVAL HISTORY:  Derhonda is here today for repeat clinical assessment.  She complains of poor appetite and says nothing tastes good. Her biggest problem is losing the use of her left hand and worsening lymphedema of the left arm. She would like to try a lymphedema specialist again and requests one in [place].  My nurse will work on making that referral. We discussed the fact that the disease of her left upper chest wall is progressing again, with a hard 3 cm nodule which is definitely worse than on her last visit. We will stop her current treatment and see if she is interested in trying Enhertu. She denies fevers or chills. She denies pain. Her appetite is good. Her  weight has increased 2 pounds over the last 3 weeks but I think this is largely the lymphedema fluid.  I have reviewed the past medical history, past surgical history, social history and family history with the patient and they are unchanged from previous note.  ALLERGIES:  has No Known Allergies.  MEDICATIONS:  Current Outpatient Medications  Medication Sig Dispense Refill   fish oil-omega-3 fatty acids 1000 MG capsule Take 1 g by mouth daily.     gabapentin (NEURONTIN) 100 MG capsule Take 1 capsule (100 mg total) by mouth at bedtime. 30 capsule 5   magnesium citrate SOLN Take 0.5 Bottles by mouth as needed for severe constipation.     ondansetron (ZOFRAN) 4 MG tablet Take 1 tablet (4 mg total) by mouth every 8 (eight) hours as needed for nausea or vomiting. 20 tablet 2   polyethylene glycol (MIRALAX / GLYCOLAX) 17 g packet Take 17 g by mouth as needed. constipation     No current facility-administered medications for this visit.    HISTORY OF PRESENT ILLNESS:   Oncology History Overview Note  Cancer  Staging Malignant neoplasm of upper-outer quadrant of left breast in female, estrogen receptor negative ([place]) Staging form: Breast, AJCC 8th Edition - Clinical stage from 10/11/2018: Stage IIB (cT2, cN1, cM0, G3, ER-, PR-, HER2+) - Signed by Truitt Merle, MD on 11/02/2018 - Clinical: No stage assigned - Unsigned    History of colon cancer, stage II  09/2005 Initial Diagnosis   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 Findings were a 5.9 x 3.8 x 1.2 cm moderate to well-differentiated adenocarcinoma penetrating the muscular wall. Forty lymph nodes negative. No vascular or lymphatic invasion. Multiple diverticula with microabscess formation and inflammation. Carcinoma present in at least 1 diverticulum. Preop CEA 5.2 with lab normal range 0 to 2.5. Preop CT scan with no obvious additional pathology.     2007 -  Chemotherapy   She received oral Xeloda chemotherapy as an adjuvant. She declined treatment on a clinical trial.    11/12/2011 Initial Diagnosis   [name], stage II   12/09/2011 Procedure   Followup colonoscopy done on 12/09/2011. She was found to have 2 polyps which were removed. Chronic diverticulosis.     Malignant neoplasm of upper-outer quadrant of left breast in female, estrogen receptor negative ([place])  09/26/2018 Mammogram   Mammogram/US of left breast 09/26/18 IMPRESSION:  1. 2.9cm irregular mass in the upper outer left breast corresponds to the palpable abnormality. This is highly suspicious for breast carcinoma.  2. Two adjacent abnormal left axillary  Lymph nodes suspicious for metastatic adenopathy. There is a third borderline abnormal left axillary LN with a cortex thickened to 57m.  3. Benign right breast cyst. No evidence of right breast malignancy.    10/11/2018 Cancer Staging   Staging form: Breast, AJCC 8th Edition - Clinical stage from 10/11/2018: Stage IIB (cT2, cN1, cM0, G3, ER-, PR-, HER2+) - Signed by [name] MD on 11/02/2018   10/11/2018 Initial Biopsy   Diagnosis 10/11/18 1. Breast, left, needle core biopsy, upper outer left 2 o'clock - INVASIVE DUCTAL CARCINOMA. - DUCTAL CARCINOMA IN SITU. - LYMPHOVASCULAR INVASION IS IDENTIFIED. - SEE COMMENT. 2. Lymph node, needle/core biopsy, left axilla - INVASIVE DUCTAL CARCINOMA. - SEE COMMENT.   10/11/2018 Receptors her2   The tumor cells are POSITIVE for Her2 (3+). Estrogen Receptor: 0%, NEGATIVE Progesterone Receptor: 0%, NEGATIVE Proliferation Marker Ki67: 70%   11/02/2018 Initial Diagnosis   Malignant neoplasm of upper-outer quadrant of left breast in female, estrogen receptor negative ([place]   11/15/2018 Breast MRI   MRI breast 11/15/18  IMPRESSION: 1. 3.3 centimeter mass in the LATERAL portion of the LEFT breast consistent with known malignancy. 2. There is significant non mass enhancement  surrounding this mass and extending anteriorly into the nipple base, with largest diameter in the anterior to posterior axis, measuring 7.2 centimeters. 3. If the patient would consider breast conservation, additional MR guided core biopsies are recommended. Consider biopsy of the inferior and anterior extent of the non mass enhancement to document extent of disease. 4. Three enlarged LEFT axillary lymph nodes. 5. RIGHT breast is negative.   11/15/2018 PET scan   PET 11/15/18 IMPRESSION: Hypermetabolic left breast lesion compatible with known primary. Hypermetabolic left axillary lymph nodes are consistent with metastatic disease.   No evidence for additional hypermetabolic metastatic involvement in the neck, chest, abdomen, or pelvis.   11/17/2018 - 03/01/2019 Chemotherapy   Neo-adjuvant Kadcyla and perjeta q3weeks for 6 cycles starting 11/17/18.  Stopped before surgery    11/29/2018 Pathology Results   Diagnosis 1. Breast, left, needle core biopsy, inferior anterior (cylinder clip) - INVASIVE DUCTAL CARCINOMA. - DUCTAL CARCINOMA IN SITU. - LYMPHOVASCULAR INVASION IS IDENTIFIED. - SEE COMMENT. 2. Breast, left, needle core biopsy, central posterior (barbell clip) - INVASIVE DUCTAL CARCINOMA. - LYMPHOVASCULAR INVASION IS IDENTIFIED.   02/12/2019 Breast MRI   IMPRESSION: 1. Complete resolution of previously identified enhancing mass and associated non mass enhancement within the left breast. This is consistent with excellent response to chemotherapy. No residual or suspicious findings are identified. 2. No MRI evidence of malignancy on the right. 3. Previously identified left axillary lymphadenopathy not definitively seen on today's study. However, this may be due to decreased field-of-view compared to prior study.     03/14/2019 Cancer Staging   Staging form: Breast, AJCC 8th Edition - Pathologic stage from 03/14/2019: pT0, pN0, cM0, GX, ER: Unknown, PR: Unknown, HER2: Not  Assessed - Signed by Truitt Merle, MD on 03/28/2019   03/14/2019 Surgery   LEFT MASTECTOMY WITH TARGETED LYMPH NODE DISSECTION and Left Axillary Sentinel Lymph  Node Biopsy by Dr [name] 03/14/19    03/14/2019 Pathology Results   FINAL MICROSCOPIC DIAGNOSIS:   A. LYMPH NODE, LEFT, SENTINEL, BIOPSY:  - There is no evidence of carcinoma in 1 of 1 lymph node (0/1).   B. BREAST, LEFT, MASTECTOMY:  - Benign breast parenchyma with treatment-related changes.  - There is no evidence of malignancy.  - See oncology table below.   C. LYMPH NODE, LEFT #1, SENTINEL, BIOPSY:  - There is no evidence of carcinoma in 1 of 1 lymph node (0/1).   D. LYMPH NODE, LEFT #2, SENTINEL, BIOPSY:  - There is no evidence of carcinoma in 1 of 1 lymph node (0/1).     04/04/2019 - 04/25/2019 Chemotherapy   Maintenance Herceptin injections every 3 weeks starting 04/03/19 to complete 1 year of treatment that was started in 11/2018. Stopped after 2nd dose as she will not be repeating [place].    06/03/2021 - 06/24/2021 Chemotherapy   Patient is on Treatment Plan : BREAST Trastuzumab q21d X 11 Cycles     06/25/2021 - 01/01/2022 Chemotherapy   Patient is on Treatment Plan : BREAST Docetaxel + Trastuzumab + Pertuzumab (THP) q21d     06/25/2021 - 01/02/2022 Chemotherapy   Patient is on Treatment Plan : BREAST Docetaxel + Trastuzumab + Pertuzumab (THP) q21d     09/14/2021 Imaging   CT chest/abdomen/pelvis  IMPRESSION:  1. Interval resolution of previously seen bulky left axillary and  subpectoral lymphadenopathy. No persistently enlarged lymph nodes.  2. Multiple liver lesions are significantly diminished in size.  3. Widespread osseous metastatic disease, with an interval increase  in sclerosis of several previously lytic lesions.  4. Constellation of findings is consistent with treatment response  of metastatic disease  5. New, although age indeterminate pathologic wedge deformity of the  T6 vertebral body as well as an  increased pathologic wedge deformity  of the L2 vertebral body.  6. Coronary artery disease.  Aortic Atherosclerosis (ICD10-I70.0).    01/26/2022 -  Chemotherapy   Patient is on Treatment Plan : BREAST METASTATIC Fam-Trastuzumab Deruxtecan-nxki (Enhertu) (5.4) q21d     Malignant neoplasm metastatic to liver ([place])  04/17/2021 Imaging   CT ABDOMEN AND PELVIS WITH CONTRAST: -New lytic lesion involving the L2 vertebral body (6:72, 2:26) with minimal cortical breakthrough superiorly, but with preservation of vertebral body height, concerning for metastatic disease.  -There are several  indeterminate hepatic lesions as described above. In addition to the lesions described above, there is an additional subtle 1.5 cm hypodensity in the hepatic dome (2:7). Given clinical history and presence of a new L2 lesion, leading differential consideration is metastatic disease. Recommend further evaluation with MRI of the abdomen with and without contrast.   05/12/2021 Imaging   MRI ABDOMEN WITH AND WITHOUT CONTRAST: Numerous heterogeneously enhancing, internally necrotic lesions in the right hepatic lobe, highly concerning for metastatic disease. Largest lesion measures up to 6.7 cm in hepatic segment VI.   Enhancing osseous lesions in the L2 vertebral body, both iliac bones, and in the sacrum, highly concerning for osseous metastases.   05/15/2021 Initial Diagnosis   Liver metastases ([place])   05/27/2021 PET scan   1. Widespread recurrent/metastatic disease in this patient who is  status post bilateral mastectomy. Left chest wall recurrence with  left axillary/subpectoral, supraclavicular nodal metastasis.  2. Hepatic, left adrenal, and widespread osseous metastasis.  3. Incidental findings, including: Right nephrolithiasis. Tiny  hiatal hernia.    05/27/2021 Imaging   MRI LUMBAR AND THORACIC SPINE: Thoracic spine:  Osseous metastatic disease involving each level. The most dramatic deposits are at T4 and T6  where there is extraosseous/epidural tumor. No cord compression but there is foraminal impingement on the left at T6-7 and on the right at T3-4. Early dorsal epidural tumor at the level of T10 and T3.   Lumbar spine:  1. Widespread osseous metastatic disease with largest deposit  replacing the L2 body where there is a compression fracture with  mild height loss. Also at this level is early epidural tumor  extension likely affecting both L2-3 foramina.  2. 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    05/27/2021 Imaging   MRI HEAD WITH AND WITHOUT CONTRAST: Negative for metastatic disease to the brain or calvarium.   06/03/2021 - 06/24/2021 Chemotherapy   Patient is on Treatment Plan : BREAST Trastuzumab q21d X 11 Cycles     06/25/2021 - 01/01/2022 Chemotherapy   Patient is on Treatment Plan : BREAST Docetaxel + Trastuzumab + Pertuzumab (THP) q21d     06/25/2021 - 01/02/2022 Chemotherapy   Patient is on Treatment Plan : BREAST Docetaxel + Trastuzumab + Pertuzumab (THP) q21d     09/14/2021 Imaging   CT chest/abdomen/pelvis  IMPRESSION:  1. Interval resolution of previously seen bulky left axillary and  subpectoral lymphadenopathy. No persistently enlarged lymph nodes.  2. Multiple liver lesions are significantly diminished in size.  3. Widespread osseous metastatic disease, with an interval increase  in sclerosis of several previously lytic lesions.  4. Constellation of findings is consistent with treatment response  of metastatic disease  5. New, although age indeterminate pathologic wedge deformity of the  T6 vertebral body as well as an increased pathologic wedge deformity  of the L2 vertebral body.  6. Coronary artery disease.  Aortic Atherosclerosis (ICD10-I70.0).    01/26/2022 -  Chemotherapy   Patient is on Treatment Plan : BREAST METASTATIC Fam-Trastuzumab Deruxtecan-nxki (Enhertu) (5.4) q21d     Malignant neoplasm metastatic to bone ([place])  04/17/2021  Imaging   CT ABDOMEN AND PELVIS WITH CONTRAST: -New lytic lesion involving the L2 vertebral body (6:72, 2:26) with minimal cortical breakthrough superiorly, but with preservation of vertebral body height, concerning for metastatic disease.  -There are several indeterminate hepatic lesions as described above. In addition to the lesions described above, there is an additional subtle 1.5 cm hypodensity in the hepatic dome (2:7). Given  clinical history and presence of a new L2 lesion, leading differential consideration is metastatic disease. Recommend further evaluation with MRI of the abdomen with and without contrast.   05/12/2021 Imaging   MRI ABDOMEN WITH AND WITHOUT CONTRAST: Numerous heterogeneously enhancing, internally necrotic lesions in the right hepatic lobe, highly concerning for metastatic disease. Largest lesion measures up to 6.7 cm in hepatic segment VI.   Enhancing osseous lesions in the L2 vertebral body, both iliac bones, and in the sacrum, highly concerning for osseous metastases.   05/15/2021 Initial Diagnosis   Bone metastases ([place])   05/27/2021 PET scan   1. Widespread recurrent/metastatic disease in this patient who is  status post bilateral mastectomy. Left chest wall recurrence with  left axillary/subpectoral, supraclavicular nodal metastasis.  2. Hepatic, left adrenal, and widespread osseous metastasis.  3. Incidental findings, including: Right nephrolithiasis. Tiny  hiatal hernia.    05/27/2021 Imaging   MRI LUMBAR AND THORACIC SPINE: Thoracic spine:  Osseous metastatic disease involving each level. The most dramatic deposits are at T4 and T6 where there is extraosseous/epidural tumor. No cord compression but there is foraminal impingement on the left at T6-7 and on the right at T3-4. Early dorsal epidural tumor at the level of T10 and T3.   Lumbar spine:  1. Widespread osseous metastatic disease with largest deposit  replacing the L2 body where there is a compression  fracture with  mild height loss. Also at this level is early epidural tumor  extension likely affecting both L2-3 foramina.  2. 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    05/27/2021 Imaging   MRI HEAD WITH AND WITHOUT CONTRAST: Negative for metastatic disease to the brain or calvarium.   06/03/2021 - 06/24/2021 Chemotherapy   Patient is on Treatment Plan : BREAST Trastuzumab q21d X 11 Cycles     06/25/2021 - 01/01/2022 Chemotherapy   Patient is on Treatment Plan : BREAST Docetaxel + Trastuzumab + Pertuzumab (THP) q21d     06/25/2021 - 01/02/2022 Chemotherapy   Patient is on Treatment Plan : BREAST Docetaxel + Trastuzumab + Pertuzumab (THP) q21d     09/14/2021 Imaging   CT chest/abdomen/pelvis  IMPRESSION:  1. Interval resolution of previously seen bulky left axillary and  subpectoral lymphadenopathy. No persistently enlarged lymph nodes.  2. Multiple liver lesions are significantly diminished in size.  3. Widespread osseous metastatic disease, with an interval increase  in sclerosis of several previously lytic lesions.  4. Constellation of findings is consistent with treatment response  of metastatic disease  5. New, although age indeterminate pathologic wedge deformity of the  T6 vertebral body as well as an increased pathologic wedge deformity  of the L2 vertebral body.  6. Coronary artery disease.  Aortic Atherosclerosis (ICD10-I70.0).    01/26/2022 -  Chemotherapy   Patient is on Treatment Plan : BREAST METASTATIC Fam-Trastuzumab Deruxtecan-nxki (Enhertu) (5.4) q21d         REVIEW OF SYSTEMS:   Constitutional: Denies fevers, chills or abnormal weight loss Eyes: Denies blurriness of vision Ears, nose, mouth, throat, and face: Denies mucositis or sore throat Respiratory: Denies cough, dyspnea or wheezes Cardiovascular: Denies palpitation, chest discomfort or lower extremity swelling Gastrointestinal:  Denies nausea, heartburn or change in bowel  habits Skin: Denies abnormal skin rashes Lymphatics: Denies new lymphadenopathy or easy bruising Neurological:Denies numbness, tingling or new weaknesses Behavioral/Psych: Mood is stable, no new changes  All other systems were reviewed with the patient and are negative.   VITALS:  Blood pressure  135/65, pulse 80, temperature 98.1 F (36.7 C), temperature source Oral, resp. rate 17, height 5' 0.25" (1.53 m), weight 111 lb 1.6 oz (50.4 kg), SpO2 93 %.  Wt Readings from Last 3 Encounters:  01/26/22 108 lb (49 kg)  01/20/22 111 lb 1.6 oz (50.4 kg)  12/31/21 109 lb 4.8 oz (49.6 kg)    Body mass index is 21.52 kg/m.  Performance status (ECOG): 1 - Symptomatic but completely ambulatory  PHYSICAL EXAM:   GENERAL:alert, no distress and comfortable SKIN: skin color, texture, turgor are normal, no rashes or significant lesions EYES: normal, Conjunctiva are pink and non-injected, sclera clear OROPHARYNX:no exudate, no erythema and lips, buccal mucosa, and tongue normal  NECK: supple, thyroid normal size, non-tender, without nodularity LYMPH:  no palpable lymphadenopathy in the cervical, axillary or inguinal LUNGS: clear to auscultation and percussion with normal breathing effort HEART: regular rate & rhythm and no murmurs and no lower extremity edema ABDOMEN:abdomen soft, non-tender and normal bowel sounds Musculoskeletal:no cyanosis of digits and no clubbing  NEURO: alert & oriented x 3 with fluent speech, no focal motor/sensory deficits  LABORATORY DATA:  I have reviewed the data as listed    Component Value Date/Time   NA 134 (A) 01/20/2022 0000   NA 137 11/13/2012 1057   K 4.0 01/20/2022 0000   K 4.5 11/13/2012 1057   CL 107 01/20/2022 0000   CO2 24 (A) 01/20/2022 0000   CO2 26 11/13/2012 1057   GLUCOSE 104 (H) 04/25/2019 1045   GLUCOSE 90 11/13/2012 1057   BUN 19 01/20/2022 0000   BUN 18.3 11/13/2012 1057   CREATININE 0.5 01/20/2022 0000   CREATININE 0.90 04/25/2019 1045    CREATININE 0.8 11/13/2012 1057   CALCIUM 9.0 01/20/2022 0000   CALCIUM 9.7 11/13/2012 1057   PROT 7.4 04/25/2019 1045   PROT 7.4 11/13/2012 1057   ALBUMIN 3.6 01/20/2022 0000   ALBUMIN 3.9 11/13/2012 1057   AST 34 01/20/2022 0000   AST 41 04/25/2019 1045   AST 22 11/13/2012 1057   ALT 19 01/20/2022 0000   ALT 29 04/25/2019 1045   ALT 15 11/13/2012 1057   ALKPHOS 65 01/20/2022 0000   ALKPHOS 77 11/13/2012 1057   BILITOT 0.5 04/25/2019 1045   BILITOT 0.54 11/13/2012 1057   GFRNONAA 58 (L) 04/25/2019 1045   GFRAA >60 04/25/2019 1045    No results found for: "SPEP", "UPEP"  Lab Results  Component Value Date   WBC 12.2 01/20/2022   NEUTROABS 8.91 01/20/2022   HGB 12.2 01/20/2022   HCT 36 01/20/2022   MCV 98 10/28/2021   PLT 202 01/20/2022      Chemistry      Component Value Date/Time   NA 134 (A) 01/20/2022 0000   NA 137 11/13/2012 1057   K 4.0 01/20/2022 0000   K 4.5 11/13/2012 1057   CL 107 01/20/2022 0000   CO2 24 (A) 01/20/2022 0000   CO2 26 11/13/2012 1057   BUN 19 01/20/2022 0000   BUN 18.3 11/13/2012 1057   CREATININE 0.5 01/20/2022 0000   CREATININE 0.90 04/25/2019 1045   CREATININE 0.8 11/13/2012 1057   GLU 94 01/20/2022 0000      Component Value Date/Time   CALCIUM 9.0 01/20/2022 0000   CALCIUM 9.7 11/13/2012 1057   ALKPHOS 65 01/20/2022 0000   ALKPHOS 77 11/13/2012 1057   AST 34 01/20/2022 0000   AST 41 04/25/2019 1045   AST 22 11/13/2012 1057   ALT 19 01/20/2022 0000  ALT 29 04/25/2019 1045   ALT 15 11/13/2012 1057   BILITOT 0.5 04/25/2019 1045   BILITOT 0.54 11/13/2012 1057       RADIOGRAPHIC STUDIES: I have personally reviewed the radiological images as listed and agreed with the findings in the report. No results found.

## 2022-01-20 NOTE — Progress Notes (Addendum)
[place] [phone]   [phone]   Patient Care Team: [name], MD as PCP - General (Family Medicine) [name], MD as Consulting Physician (Oncology)   Name of the patient: Jasmin Lewis  [id number]  [birth year]   Date of visit: 01/20/22  Diagnosis- Recurrent HER2 receptor positive breast cancer  Chief complaint/Reason for visit- Initial Meeting for [facility], preparing for starting HER2 targeted therapy due to recent progression of metastatic disease  Heme/Onc history:  Oncology History Overview Note  Cancer Staging Malignant neoplasm of upper-outer quadrant of left breast in female, estrogen receptor negative ([place]) Staging form: Breast, AJCC 8th Edition - Clinical stage from 10/11/2018: Stage IIB (cT2, cN1, cM0, G3, ER-, PR-, HER2+) - Signed by Truitt Merle, MD on 11/02/2018 - Clinical: No stage assigned - Unsigned    History of colon cancer, stage II  09/2005 Initial Diagnosis   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 Findings were a 5.9 x 3.8 x 1.2 cm moderate to well-differentiated adenocarcinoma penetrating the muscular wall. Forty lymph nodes negative. No vascular or lymphatic invasion. Multiple diverticula with microabscess formation and inflammation. Carcinoma present in at least 1 diverticulum. Preop CEA 5.2 with lab normal range 0 to 2.5. Preop CT scan with no obvious additional pathology.    2007 -  Chemotherapy   She received oral Xeloda chemotherapy as an adjuvant. She declined treatment on a clinical trial.    11/12/2011 Initial Diagnosis   [name], stage II   12/09/2011 Procedure   Followup colonoscopy done on 12/09/2011. She was found to have 2 polyps which were removed. Chronic diverticulosis.     Malignant neoplasm of upper-outer quadrant of left breast  in female, estrogen receptor negative ([place])  09/26/2018 Mammogram   Mammogram/US of left breast 09/26/18 IMPRESSION:  1. 2.9cm irregular mass in the upper outer left breast corresponds to the palpable abnormality. This is highly suspicious for breast carcinoma.  2. Two adjacent abnormal left axillary  Lymph nodes suspicious for metastatic adenopathy. There is a third borderline abnormal left axillary LN with a cortex thickened to 61m.  3. Benign right breast cyst. No evidence of right breast malignancy.    10/11/2018 Cancer Staging   Staging form: Breast, AJCC 8th Edition - Clinical stage from 10/11/2018: Stage IIB (cT2, cN1, cM0, G3, ER-, PR-, HER2+) - Signed by [name] MD on 11/02/2018   10/11/2018 Initial Biopsy   Diagnosis 10/11/18 1. Breast, left, needle core biopsy, upper outer left 2 o'clock - INVASIVE DUCTAL CARCINOMA. - DUCTAL CARCINOMA IN SITU. - LYMPHOVASCULAR INVASION IS IDENTIFIED. - SEE COMMENT. 2. Lymph node, needle/core biopsy, left axilla - INVASIVE DUCTAL CARCINOMA. - SEE COMMENT.   10/11/2018 Receptors her2   The tumor cells are POSITIVE for Her2 (3+). Estrogen Receptor: 0%, NEGATIVE Progesterone Receptor: 0%, NEGATIVE Proliferation Marker Ki67: 70%   11/02/2018 Initial Diagnosis   Malignant neoplasm of upper-outer quadrant of left breast in female, estrogen receptor negative ([place]   11/15/2018 Breast MRI   MRI breast 11/15/18  IMPRESSION: 1. 3.3 centimeter mass in the LATERAL portion of the LEFT breast consistent with known malignancy. 2. There is significant non mass enhancement surrounding this mass and extending anteriorly into the nipple base, with largest diameter in the anterior to posterior axis, measuring 7.2 centimeters. 3. If the patient would consider breast conservation,  additional MR guided core biopsies are recommended. Consider biopsy of the inferior and anterior extent of the non mass enhancement to document extent of disease. 4. Three enlarged LEFT  axillary lymph nodes. 5. RIGHT breast is negative.   11/15/2018 PET scan   PET 11/15/18 IMPRESSION: Hypermetabolic left breast lesion compatible with known primary. Hypermetabolic left axillary lymph nodes are consistent with metastatic disease.   No evidence for additional hypermetabolic metastatic involvement in the neck, chest, abdomen, or pelvis.   11/17/2018 - 03/01/2019 Chemotherapy   Neo-adjuvant Kadcyla and perjeta q3weeks for 6 cycles starting 11/17/18. Stopped before surgery    11/29/2018 Pathology Results   Diagnosis 1. Breast, left, needle core biopsy, inferior anterior (cylinder clip) - INVASIVE DUCTAL CARCINOMA. - DUCTAL CARCINOMA IN SITU. - LYMPHOVASCULAR INVASION IS IDENTIFIED. - SEE COMMENT. 2. Breast, left, needle core biopsy, central posterior (barbell clip) - INVASIVE DUCTAL CARCINOMA. - LYMPHOVASCULAR INVASION IS IDENTIFIED.   02/12/2019 Breast MRI   IMPRESSION: 1. Complete resolution of previously identified enhancing mass and associated non mass enhancement within the left breast. This is consistent with excellent response to chemotherapy. No residual or suspicious findings are identified. 2. No MRI evidence of malignancy on the right. 3. Previously identified left axillary lymphadenopathy not definitively seen on today's study. However, this may be due to decreased field-of-view compared to prior study.     03/14/2019 Cancer Staging   Staging form: Breast, AJCC 8th Edition - Pathologic stage from 03/14/2019: pT0, pN0, cM0, GX, ER: Unknown, PR: Unknown, HER2: Not Assessed - Signed by Truitt Merle, MD on 03/28/2019   03/14/2019 Surgery   LEFT MASTECTOMY WITH TARGETED LYMPH NODE DISSECTION and Left Axillary Sentinel Lymph  Node Biopsy by Dr [name] 03/14/19    03/14/2019 Pathology Results   FINAL MICROSCOPIC DIAGNOSIS:   A. LYMPH NODE, LEFT, SENTINEL, BIOPSY:  - There is no evidence of carcinoma in 1 of 1 lymph node (0/1).   B. BREAST, LEFT, MASTECTOMY:  -  Benign breast parenchyma with treatment-related changes.  - There is no evidence of malignancy.  - See oncology table below.   C. LYMPH NODE, LEFT #1, SENTINEL, BIOPSY:  - There is no evidence of carcinoma in 1 of 1 lymph node (0/1).   D. LYMPH NODE, LEFT #2, SENTINEL, BIOPSY:  - There is no evidence of carcinoma in 1 of 1 lymph node (0/1).     04/04/2019 - 04/25/2019 Chemotherapy   Maintenance Herceptin injections every 3 weeks starting 04/03/19 to complete 1 year of treatment that was started in 11/2018. Stopped after 2nd dose as she will not be repeating Echos.    06/03/2021 - 06/24/2021 Chemotherapy   Patient is on Treatment Plan : BREAST Trastuzumab q21d X 11 Cycles     06/25/2021 - 01/01/2022 Chemotherapy   Patient is on Treatment Plan : BREAST Docetaxel + Trastuzumab + Pertuzumab (THP) q21d     06/25/2021 - 01/02/2022 Chemotherapy   Patient is on Treatment Plan : BREAST Docetaxel + Trastuzumab + Pertuzumab (THP) q21d     09/14/2021 Imaging   CT chest/abdomen/pelvis  IMPRESSION:  1. Interval resolution of previously seen bulky left axillary and  subpectoral lymphadenopathy. No persistently enlarged lymph nodes.  2. Multiple liver lesions are significantly diminished in size.  3. Widespread osseous metastatic disease, with an interval increase  in sclerosis of several previously lytic lesions.  4. Constellation of findings is consistent with treatment response  of metastatic disease  5. New, although age indeterminate pathologic wedge deformity of the  T6 vertebral body as well as an increased pathologic wedge deformity  of the L2 vertebral body.  6. Coronary artery disease.  Aortic Atherosclerosis (ICD10-I70.0).    Malignant neoplasm metastatic to liver ([place])  04/17/2021 Imaging   CT ABDOMEN AND PELVIS WITH CONTRAST: -New lytic lesion involving the L2 vertebral body (6:72, 2:26) with minimal cortical breakthrough superiorly, but with preservation of vertebral body height,  concerning for metastatic disease.  -There are several indeterminate hepatic lesions as described above. In addition to the lesions described above, there is an additional subtle 1.5 cm hypodensity in the hepatic dome (2:7). Given clinical history and presence of a new L2 lesion, leading differential consideration is metastatic disease. Recommend further evaluation with MRI of the abdomen with and without contrast.   05/12/2021 Imaging   MRI ABDOMEN WITH AND WITHOUT CONTRAST: Numerous heterogeneously enhancing, internally necrotic lesions in the right hepatic lobe, highly concerning for metastatic disease. Largest lesion measures up to 6.7 cm in hepatic segment VI.   Enhancing osseous lesions in the L2 vertebral body, both iliac bones, and in the sacrum, highly concerning for osseous metastases.   05/15/2021 Initial Diagnosis   Liver metastases ([place])   05/27/2021 PET scan   1. Widespread recurrent/metastatic disease in this patient who is  status post bilateral mastectomy. Left chest wall recurrence with  left axillary/subpectoral, supraclavicular nodal metastasis.  2. Hepatic, left adrenal, and widespread osseous metastasis.  3. Incidental findings, including: Right nephrolithiasis. Tiny  hiatal hernia.    05/27/2021 Imaging   MRI LUMBAR AND THORACIC SPINE: Thoracic spine:  Osseous metastatic disease involving each level. The most dramatic deposits are at T4 and T6 where there is extraosseous/epidural tumor. No cord compression but there is foraminal impingement on the left at T6-7 and on the right at T3-4. Early dorsal epidural tumor at the level of T10 and T3.   Lumbar spine:  1. Widespread osseous metastatic disease with largest deposit  replacing the L2 body where there is a compression fracture with  mild height loss. Also at this level is early epidural tumor  extension likely affecting both L2-3 foramina.  2. 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     05/27/2021 Imaging   MRI HEAD WITH AND WITHOUT CONTRAST: Negative for metastatic disease to the brain or calvarium.   06/03/2021 - 06/24/2021 Chemotherapy   Patient is on Treatment Plan : BREAST Trastuzumab q21d X 11 Cycles     06/25/2021 - 01/01/2022 Chemotherapy   Patient is on Treatment Plan : BREAST Docetaxel + Trastuzumab + Pertuzumab (THP) q21d     06/25/2021 - 01/02/2022 Chemotherapy   Patient is on Treatment Plan : BREAST Docetaxel + Trastuzumab + Pertuzumab (THP) q21d     09/14/2021 Imaging   CT chest/abdomen/pelvis  IMPRESSION:  1. Interval resolution of previously seen bulky left axillary and  subpectoral lymphadenopathy. No persistently enlarged lymph nodes.  2. Multiple liver lesions are significantly diminished in size.  3. Widespread osseous metastatic disease, with an interval increase  in sclerosis of several previously lytic lesions.  4. Constellation of findings is consistent with treatment response  of metastatic disease  5. New, although age indeterminate pathologic wedge deformity of the  T6 vertebral body as well as an increased pathologic wedge deformity  of the L2 vertebral body.  6. Coronary artery disease.  Aortic Atherosclerosis (ICD10-I70.0).    Malignant neoplasm metastatic to bone ([place])  04/17/2021 Imaging   CT ABDOMEN AND PELVIS WITH CONTRAST: -New lytic lesion involving  the L2 vertebral body (6:72, 2:26) with minimal cortical breakthrough superiorly, but with preservation of vertebral body height, concerning for metastatic disease.  -There are several indeterminate hepatic lesions as described above. In addition to the lesions described above, there is an additional subtle 1.5 cm hypodensity in the hepatic dome (2:7). Given clinical history and presence of a new L2 lesion, leading differential consideration is metastatic disease. Recommend further evaluation with MRI of the abdomen with and without contrast.   05/12/2021 Imaging   MRI ABDOMEN WITH AND  WITHOUT CONTRAST: Numerous heterogeneously enhancing, internally necrotic lesions in the right hepatic lobe, highly concerning for metastatic disease. Largest lesion measures up to 6.7 cm in hepatic segment VI.   Enhancing osseous lesions in the L2 vertebral body, both iliac bones, and in the sacrum, highly concerning for osseous metastases.   05/15/2021 Initial Diagnosis   Bone metastases ([place])   05/27/2021 PET scan   1. Widespread recurrent/metastatic disease in this patient who is  status post bilateral mastectomy. Left chest wall recurrence with  left axillary/subpectoral, supraclavicular nodal metastasis.  2. Hepatic, left adrenal, and widespread osseous metastasis.  3. Incidental findings, including: Right nephrolithiasis. Tiny  hiatal hernia.    05/27/2021 Imaging   MRI LUMBAR AND THORACIC SPINE: Thoracic spine:  Osseous metastatic disease involving each level. The most dramatic deposits are at T4 and T6 where there is extraosseous/epidural tumor. No cord compression but there is foraminal impingement on the left at T6-7 and on the right at T3-4. Early dorsal epidural tumor at the level of T10 and T3.   Lumbar spine:  1. Widespread osseous metastatic disease with largest deposit  replacing the L2 body where there is a compression fracture with  mild height loss. Also at this level is early epidural tumor  extension likely affecting both L2-3 foramina.  2. 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    05/27/2021 Imaging   MRI HEAD WITH AND WITHOUT CONTRAST: Negative for metastatic disease to the brain or calvarium.   06/03/2021 - 06/24/2021 Chemotherapy   Patient is on Treatment Plan : BREAST Trastuzumab q21d X 11 Cycles     06/25/2021 - 01/01/2022 Chemotherapy   Patient is on Treatment Plan : BREAST Docetaxel + Trastuzumab + Pertuzumab (THP) q21d     06/25/2021 - 01/02/2022 Chemotherapy   Patient is on Treatment Plan : BREAST Docetaxel + Trastuzumab +  Pertuzumab (THP) q21d     09/14/2021 Imaging   CT chest/abdomen/pelvis  IMPRESSION:  1. Interval resolution of previously seen bulky left axillary and  subpectoral lymphadenopathy. No persistently enlarged lymph nodes.  2. Multiple liver lesions are significantly diminished in size.  3. Widespread osseous metastatic disease, with an interval increase  in sclerosis of several previously lytic lesions.  4. Constellation of findings is consistent with treatment response  of metastatic disease  5. New, although age indeterminate pathologic wedge deformity of the  T6 vertebral body as well as an increased pathologic wedge deformity  of the L2 vertebral body.  6. Coronary artery disease.  Aortic Atherosclerosis (ICD10-I70.0).      Interval history-  The patient presents to chemo care clinic today for initial meeting in preparation for starting alternate HER2 targeted therapy. I introduced the chemo care clinic and we discussed that the role of the clinic is to assist those who are at an increased risk of emergency room visits and/or complications during the course of chemotherapy treatment. We discussed that the increased risk takes into account factors  such as age, performance status, and co-morbidities. We also discussed that for some, this might include barriers to care such as not having a primary care provider, lack of insurance/transportation, or not being able to afford medications. We discussed that the goal of the program is to help prevent unplanned ER visits and help reduce complications during chemotherapy. We do this by discussing specific risk factors to each individual and identifying ways that we can help improve these risk factors and reduce barriers to care.   No Known Allergies  Past Medical History:  Diagnosis Date   Arthritis    shoulder   Breast cancer ([place])    Cancer ([place])    left breast ca   Colon cancer ([place])    [name], stage II 11/12/2011   Sigmoid lesion  5.9 cm  40 nodes negative but focus of cancer in a diverticum   Pre-op CEA 5.2 with lab normal up to 2.5 resected 09/21/05   Xeloda adjuvant chemotherapy   Hypertension     Past Surgical History:  Procedure Laterality Date   COLONOSCOPY     HEMICOLECTOMY  2007   MASTECTOMY WITH AXILLARY LYMPH NODE DISSECTION Left 03/14/2019   Procedure: LEFT MASTECTOMY WITH TARGETED LYMPH NODE DISSECTION;  Surgeon: Jovita Kussmaul, MD;  Location: [place];  Service: General;  Laterality: Left;   SENTINEL NODE BIOPSY Left 03/14/2019   Procedure: Left Axillary Sentinel Lymph  Node Biopsy;  Surgeon: Jovita Kussmaul, MD;  Location: [place];  Service: General;  Laterality: Left;   TONSILLECTOMY     WISDOM TOOTH EXTRACTION      Social History   Socioeconomic History   Marital status: Married    Spouse name: Not on file   Number of children: 3   Years of education: Not on file   Highest education level: Not on file  Occupational History   Not on file  Tobacco Use   Smoking status: Never   Smokeless tobacco: Never  Vaping Use   Vaping Use: Never used  Substance and Sexual Activity   Alcohol use: Never   Drug use: Never   Sexual activity: Not on file  Other Topics Concern   Not on file  Social History Narrative   Not on file   Social Determinants of Health   Financial Resource Strain: Not on file  Food Insecurity: Not on file  Transportation Needs: Not on file  Physical Activity: Not on file  Stress: Not on file  Social Connections: Not on file  Intimate Partner Violence: Not on file    Family History  Problem Relation Age of Onset   Cancer Maternal Aunt        colon cancer      Current Outpatient Medications:    dexamethasone (DECADRON) 4 MG tablet, Take 5 tabs at the night before and 5 tab the morning of chemotherapy, every 3 weeks, by mouth (Patient not taking: Reported on 07/15/2021), Disp: 60 tablet, Rfl: 0   fish oil-omega-3 fatty acids 1000 MG capsule, Take 1 g by mouth daily., Disp: ,  Rfl:    gabapentin (NEURONTIN) 100 MG capsule, Take 1 capsule (100 mg total) by mouth at bedtime., Disp: 30 capsule, Rfl: 5   magnesium citrate SOLN, Take 0.5 Bottles by mouth as needed for severe constipation., Disp: , Rfl:    ondansetron (ZOFRAN) 4 MG tablet, Take 1 tablet (4 mg total) by mouth every 8 (eight) hours as needed for nausea or vomiting., Disp: 20 tablet, Rfl:  2   polyethylene glycol (MIRALAX / GLYCOLAX) 17 g packet, Take 17 g by mouth as needed. constipation, Disp: , Rfl:      Latest Ref Rng & Units 01/20/2022   12:00 AM  CMP  BUN 4 - 21 19      Creatinine 0.5 - 1.1 0.5      Sodium 137 - 147 134      Potassium 3.5 - 5.1 mEq/L 4.0      Chloride 99 - 108 107      CO2 13 - 22 24      Calcium 8.7 - 10.7 9.0      Alkaline Phos 25 - 125 65      AST 13 - 35 34      ALT 7 - 35 U/L 19         This result is from an external source.       Latest Ref Rng & Units 01/20/2022   12:00 AM  CBC  WBC  12.2      Hemoglobin 12.0 - 16.0 12.2      Hematocrit 36 - 46 36      Platelets 150 - 400 K/uL 202         This result is from an external source.     No images are attached to the encounter.  No results found.   Assessment and plan- Patient is a 88 y.o. female who presents to [facility] for initial meeting in preparation for starting chemotherapy for the treatment of recurrent HER2 positive breast cancer.   Chemo Care Clinic/High Risk for ER/Hospitalization during chemotherapy- We discussed the role of the chemo care clinic and identified patient specific risk factors. I discussed that patient was identified as high risk primarily based on: Her age  Patient has past medical history positive for: Past Medical History:  Diagnosis Date   Arthritis    shoulder   Breast cancer ([place])    Cancer ([place])    left breast ca   Colon cancer ([place])    [name], stage II 11/12/2011   Sigmoid lesion 5.9 cm  40 nodes negative but focus of cancer in a diverticum   Pre-op  CEA 5.2 with lab normal up to 2.5 resected 09/21/05   Xeloda adjuvant chemotherapy   Hypertension     Patient has past surgical history positive for: Past Surgical History:  Procedure Laterality Date   COLONOSCOPY     HEMICOLECTOMY  2007   MASTECTOMY WITH AXILLARY LYMPH NODE DISSECTION Left 03/14/2019   Procedure: LEFT MASTECTOMY WITH TARGETED LYMPH NODE DISSECTION;  Surgeon: Jovita Kussmaul, MD;  Location: [place];  Service: General;  Laterality: Left;   SENTINEL NODE BIOPSY Left 03/14/2019   Procedure: Left Axillary Sentinel Lymph  Node Biopsy;  Surgeon: Jovita Kussmaul, MD;  Location: [place];  Service: General;  Laterality: Left;   TONSILLECTOMY     WISDOM TOOTH EXTRACTION      Provided general information including the following:  1.  Date of education: 01/20/2022 2.  Physician name: Dr. Hinton Rao 3.  Diagnosis: Recurrent HER2 positive breast cancer 4.  Stage: Recurrent 5.  Control 6.  Chemotherapy plan including drugs and how often: [place] every 3 weeks 7.  Start date: 01/26/2022  8.  Other referrals: None at this time 9.  The patient is to call our office with any questions or concerns.  Our office number [phone], if after hours or on the weekend, call the  same number and wait for the answering service.  There is always an oncologist on call. 10.  Medications prescribed: ondansetron, prochlorperazine 11.  The patient has verbalized understanding of the treatment plan and has no barriers to adherence or understanding.   Obtained signed consent from patient.   Discussed symptoms including:  1.  Low blood counts including red blood cells, white blood cells and platelets. 2. Infection including to avoid large crowds, wash hands frequently, and stay away from people who were sick.  If fever develops of 100.4 or higher, call our office. 3.  Mucositis-given instructions on mouth rinse (baking soda and salt mixture).  Keep mouth clean.  Use soft bristle toothbrush.  If mouth sores  develop, call our clinic. 4.  Nausea/vomiting-gave prescriptions for ondansetron 4 mg every 4 hours as needed for nausea, may take around the clock if persistent.  Prochlorperazine 10 mg every 6 hours, may take around the clock if persistent. 5.  Diarrhea-use over-the-counter Imodium.  Call clinic if not controlled. 6.  Constipation-use senna-S, 1 to 2 tablets twice a day.  If no BM in 2 to 3 days call the clinic. 7.  Loss of appetite-try to eat small meals every 2-3 hours.  Call clinic if not eating or drinking. 8.  Taste changes-zinc 500 mg daily.  If becomes severe call clinic. 9.  Avoiding alcoholic beverages. 10.  Drink 2 to 3 quarts of water per day. Call clinic if not able to drink. 11.  Peripheral neuropathy-patient to call if numbness or tingling in hands or feet is persistent     Gave information on the supportive care team and how to contact them regarding services.  Discussed advanced directives.  The patient does not have their advanced directives.  We discussed that social determinants of health may have significant impacts on health and outcomes for cancer patients.  Today we discussed specific social determinants of performance status, alcohol use, depression, financial needs, food insecurity, housing, interpersonal violence, social connections, stress, tobacco use, and transportation.    After lengthy discussion the following were identified as areas of need:   Outpatient services: We discussed options including home based and outpatient services, DME and care program. We discussed that patients who participate in regular physical activity report fewer negative impacts of cancer and treatments and report less fatigue.   Financial Concerns: We discussed that living with cancer can create tremendous financial burden.  We discussed options for assistance. I asked that if assistance is needed in affording medications or paying bills to please let us know so that we can provide  assistance. We discussed options for food including social services.  Referral to Social work: Our social worker will contact the patient.  Support groups: We discussed options for support groups at [facility]. If these are of interest, patient can notify either myself or primary nursing team.  Transportation: We discussed options for transportation.  The patient will contact our office if she requires assistance with transportation.  Palliative care services: We have palliative care services available in the cancer center to discuss goals of care and advanced care planning.  Please let us know if you have any questions or would like to speak to our palliative care practitioner.  Symptom Management Clinic: We discussed our symptom management clinic which is available for acute concerns while receiving treatment such as nausea, vomiting or diarrhea.  We can be reached via telephone at [phone].  We are available for virtual or in person visits on the  same day from 9 to 4 PM Monday through Friday.   She denies needing specific assistance at this time.  She will be followed by Dr. Remi Deter clinical team.   Disposition: RTC on 02/05/2022  Visit Diagnosis 1. Malignant neoplasm metastatic to liver ([place])   2. Malignant neoplasm metastatic to bone ([place])   3. Malignant neoplasm of upper-outer quadrant of left breast in female, estrogen receptor negative ([place])   4. Erroneous encounter - disregard     I discussed the assessment and treatment plan with the patient.  The patient was provided an opportunity to ask questions and all were answered. The patient expressed understanding and was in agreement with this plan. She also understands that she can call clinic at any time with any questions, concerns, or complaints.   I provided 30 minutes of  face-to-face time  during this encounter, and > 50% was spent counseling as documented under my assessment & plan.   Aluna Whiston A. Georgann Housekeeper, [place] [phone]  This encounter was created in error - please disregard.

## 2022-01-21 ENCOUNTER — Inpatient Hospital Stay: Payer: Medicare Other

## 2022-01-21 ENCOUNTER — Telehealth: Payer: Self-pay

## 2022-01-21 ENCOUNTER — Other Ambulatory Visit: Payer: Self-pay | Admitting: Oncology

## 2022-01-21 ENCOUNTER — Encounter: Payer: Self-pay | Admitting: Oncology

## 2022-01-21 ENCOUNTER — Other Ambulatory Visit: Payer: Self-pay | Admitting: Hematology and Oncology

## 2022-01-21 DIAGNOSIS — C7951 Secondary malignant neoplasm of bone: Secondary | ICD-10-CM

## 2022-01-21 DIAGNOSIS — C787 Secondary malignant neoplasm of liver and intrahepatic bile duct: Secondary | ICD-10-CM

## 2022-01-21 DIAGNOSIS — Z171 Estrogen receptor negative status [ER-]: Secondary | ICD-10-CM

## 2022-01-21 NOTE — Telephone Encounter (Signed)
-----   Message from [name], MD sent at 01/20/2022  5:58 PM EDT ----- Regarding: refer As we discussed, ref to lymphedema specialist in [facility]

## 2022-01-21 NOTE — Telephone Encounter (Signed)
-----   Message from [name], MD sent at 01/20/2022  5:58 PM EDT ----- Regarding: refer As we discussed, ref to lymphedema specialist in [facility]

## 2022-01-21 NOTE — Telephone Encounter (Signed)
Referral to Lymphedema specialist in [facility] sent electronically on 01/20/22.

## 2022-01-22 ENCOUNTER — Other Ambulatory Visit: Payer: Self-pay

## 2022-01-22 ENCOUNTER — Ambulatory Visit: Payer: Medicare Other

## 2022-01-22 ENCOUNTER — Encounter: Payer: Self-pay | Admitting: Oncology

## 2022-01-25 ENCOUNTER — Other Ambulatory Visit: Payer: Self-pay

## 2022-01-25 DIAGNOSIS — C7951 Secondary malignant neoplasm of bone: Secondary | ICD-10-CM

## 2022-01-25 DIAGNOSIS — C787 Secondary malignant neoplasm of liver and intrahepatic bile duct: Secondary | ICD-10-CM

## 2022-01-25 DIAGNOSIS — C50412 Malignant neoplasm of upper-outer quadrant of left female breast: Secondary | ICD-10-CM

## 2022-01-25 MED FILL — Fam-Trastuzumab Deruxtecan-nxki For IV Soln 100 MG: INTRAVENOUS | Qty: 10 | Status: AC

## 2022-01-25 MED FILL — Dexamethasone Sodium Phosphate Inj 100 MG/10ML: INTRAMUSCULAR | Qty: 1 | Status: AC

## 2022-01-26 ENCOUNTER — Inpatient Hospital Stay: Payer: Medicare Other

## 2022-01-26 ENCOUNTER — Telehealth: Payer: Self-pay

## 2022-01-26 ENCOUNTER — Encounter: Payer: Self-pay | Admitting: Oncology

## 2022-01-26 VITALS — BP 148/92 | HR 90 | Temp 97.7°F | Resp 18 | Ht 60.25 in | Wt 108.0 lb

## 2022-01-26 DIAGNOSIS — C787 Secondary malignant neoplasm of liver and intrahepatic bile duct: Secondary | ICD-10-CM

## 2022-01-26 DIAGNOSIS — Z85038 Personal history of other malignant neoplasm of large intestine: Secondary | ICD-10-CM | POA: Diagnosis not present

## 2022-01-26 DIAGNOSIS — I890 Lymphedema, not elsewhere classified: Secondary | ICD-10-CM | POA: Diagnosis not present

## 2022-01-26 DIAGNOSIS — Z5112 Encounter for antineoplastic immunotherapy: Secondary | ICD-10-CM | POA: Diagnosis present

## 2022-01-26 DIAGNOSIS — Z853 Personal history of malignant neoplasm of breast: Secondary | ICD-10-CM | POA: Diagnosis not present

## 2022-01-26 DIAGNOSIS — Z9221 Personal history of antineoplastic chemotherapy: Secondary | ICD-10-CM | POA: Diagnosis not present

## 2022-01-26 DIAGNOSIS — I10 Essential (primary) hypertension: Secondary | ICD-10-CM | POA: Diagnosis not present

## 2022-01-26 DIAGNOSIS — Z171 Estrogen receptor negative status [ER-]: Secondary | ICD-10-CM

## 2022-01-26 DIAGNOSIS — Z9012 Acquired absence of left breast and nipple: Secondary | ICD-10-CM | POA: Diagnosis not present

## 2022-01-26 DIAGNOSIS — C7951 Secondary malignant neoplasm of bone: Secondary | ICD-10-CM

## 2022-01-26 DIAGNOSIS — C50412 Malignant neoplasm of upper-outer quadrant of left female breast: Secondary | ICD-10-CM | POA: Diagnosis present

## 2022-01-26 MED ORDER — HEPARIN SOD (PORK) LOCK FLUSH 100 UNIT/ML IV SOLN
500.0000 [IU] | Freq: Once | INTRAVENOUS | Status: AC | PRN
  Administered 2022-01-26: 500 [IU]

## 2022-01-26 MED ORDER — SODIUM CHLORIDE 0.9 % IV SOLN
10.0000 mg | Freq: Once | INTRAVENOUS | Status: AC
  Administered 2022-01-26: 10 mg via INTRAVENOUS
  Filled 2022-01-26: qty 10

## 2022-01-26 MED ORDER — DEXTROSE 5 % IV SOLN: Freq: Once | INTRAVENOUS | Status: AC

## 2022-01-26 MED ORDER — FAM-TRASTUZUMAB DERUXTECAN-NXKI CHEMO 100 MG IV SOLR
3.9500 mg/kg | Freq: Once | INTRAVENOUS | Status: AC
  Administered 2022-01-26: 200 mg via INTRAVENOUS
  Filled 2022-01-26: qty 10

## 2022-01-26 MED ORDER — SODIUM CHLORIDE 0.9 % IV SOLN: Freq: Once | INTRAVENOUS | Status: DC

## 2022-01-26 MED ORDER — SODIUM CHLORIDE 0.9% FLUSH
10.0000 mL | INTRAVENOUS | Status: DC | PRN
  Administered 2022-01-26: 10 mL

## 2022-01-26 MED ORDER — ACETAMINOPHEN 325 MG PO TABS
650.0000 mg | ORAL_TABLET | Freq: Once | ORAL | Status: AC
  Administered 2022-01-26: 650 mg via ORAL
  Filled 2022-01-26: qty 2

## 2022-01-26 MED ORDER — DIPHENHYDRAMINE HCL 25 MG PO CAPS
50.0000 mg | ORAL_CAPSULE | Freq: Once | ORAL | Status: AC
  Administered 2022-01-26: 50 mg via ORAL
  Filled 2022-01-26: qty 2

## 2022-01-26 MED ORDER — ZOLEDRONIC ACID 4 MG/5ML IV CONC: 3.0000 mg | Freq: Once | INTRAVENOUS | Status: DC

## 2022-01-26 MED ORDER — PALONOSETRON HCL INJECTION 0.25 MG/5ML
0.2500 mg | Freq: Once | INTRAVENOUS | Status: AC
  Administered 2022-01-26: 0.25 mg via INTRAVENOUS
  Filled 2022-01-26: qty 5

## 2022-01-26 NOTE — Patient Instructions (Signed)
Fam-Trastuzumab Deruxtecan Injection What is this medication? FAM-TRASTUZUMAB DERUXTECAN (fam-tras TOOZ eu mab DER ux TEE kan) treats some types of cancer. It works by blocking a protein that causes cancer cells to grow and multiply. This helps to slow or stop the spread of cancer cells. This medicine may be used for other purposes; ask your health care provider or pharmacist if you have questions. COMMON BRAND NAME(S): ENHERTU What should I tell my care team before I take this medication? They need to know if you have any of these conditions: Heart disease Heart failure Infection, especially a viral infection, such as chickenpox, cold sores, or herpes Liver disease Lung or breathing disease, such as asthma or COPD An unusual or allergic reaction to fam-trastuzumab deruxtecan, other medications, foods, dyes, or preservatives Pregnant or trying to get pregnant Breast-feeding How should I use this medication? This medication is injected into a vein. It is given by your care team in a hospital or clinic setting. A special MedGuide will be given to you before each treatment. Be sure to read this information carefully each time. Talk to your care team about the use of this medication in children. Special care may be needed. Overdosage: If you think you have taken too much of this medicine contact a poison control center or emergency room at once. NOTE: This medicine is only for you. Do not share this medicine with others. What if I miss a dose? It is important not to miss your dose. Call your care team if you are unable to keep an appointment. What may interact with this medication? Interactions are not expected. This list may not describe all possible interactions. Give your health care provider a list of all the medicines, herbs, non-prescription drugs, or dietary supplements you use. Also tell them if you smoke, drink alcohol, or use illegal drugs. Some items may interact with your  medicine. What should I watch for while using this medication? Visit your care team for regular checks on your progress. Tell your care team if your symptoms do not start to get better or if they get worse. Your condition will be monitored carefully while you are receiving this medication. Do not become pregnant while taking this medication or for 7 months after stopping it. Women should inform their care team if they wish to become pregnant or think they might be pregnant. Men should not father a child while taking this medication and for 4 months after stopping it. There is potential for serious side effects to an unborn child. Talk to your care team for more information. Do not breast-feed an infant while taking this medication or for 7 months after the last dose. This medication has caused decreased sperm counts in some men. This may make it more difficult to father a child. Talk to your care team if you are concerned about your fertility. This medication may increase your risk to bruise or bleed. Call your care team if you notice any unusual bleeding. Be careful brushing or flossing your teeth or using a toothpick because you may get an infection or bleed more easily. If you have any dental work done, tell your dentist you are receiving this medication. This medication may cause dry eyes and blurred vision. If you wear contact lenses, you may feel some discomfort. Lubricating eye drops may help. See your care team if the problem does not go away or is severe. This medication may increase your risk of getting an infection. Call your care team for   advice if you get a fever, chills, sore throat, or other symptoms of a cold or flu. Do not treat yourself. Try to avoid being around people who are sick. Avoid taking medications that contain aspirin, acetaminophen, ibuprofen, naproxen, or ketoprofen unless instructed by your care team. These medications may hide a fever. What side effects may I notice from  receiving this medication? Side effects that you should report to your care team as soon as possible: Allergic reactions--skin rash, itching, hives, swelling of the face, lips, tongue, or throat Dry cough, shortness of breath or trouble breathing Infection--fever, chills, cough, sore throat, wounds that don't heal, pain or trouble when passing urine, general feeling of discomfort or being unwell Heart failure--shortness of breath, swelling of the ankles, feet, or hands, sudden weight gain, unusual weakness or fatigue Unusual bruising or bleeding Side effects that usually do not require medical attention (report these to your care team if they continue or are bothersome): Constipation Diarrhea Hair loss Muscle pain Nausea Vomiting This list may not describe all possible side effects. Call your doctor for medical advice about side effects. You may report side effects to FDA at 1-800-FDA-1088. Where should I keep my medication? This medication is given in a hospital or clinic. It will not be stored at home. NOTE: This sheet is a summary. It may not cover all possible information. If you have questions about this medicine, talk to your doctor, pharmacist, or health care provider.  2023 Elsevier/Gold Standard (2021-01-07 00:00:00) 

## 2022-01-26 NOTE — Addendum Note (Signed)
Addended by: Juanetta Beets on: 01/26/2022 11:41 AM   Modules accepted: Orders

## 2022-01-26 NOTE — Telephone Encounter (Signed)
-----   Message from [name], MD sent at 01/25/2022 10:55 AM EDT ----- Regarding: RE: Referral for lymphedema This pt and her son have requested a specific place, [facility], can you deal with this? They may be fine with anyone that is certified but I would check with her first ----- Message ----- From: Toribio Harbour, PT Sent: 01/25/2022   9:37 AM EDT To: [name], MD; Lelon Huh Subject: Referral for lymphedema                        Dr. Hinton Rao, hi! I am a PT that supervises our cancer rehab clinic at [facility]. We have 5 certified lymphedema therapists (all PTs). Our Neuro clinic received your referral on this pt for OT. Could you re-enter it for PT and send it to department [place]? Our cost center is [phone] so sometimes typing that in for the location is easier.  Thanks a lot, [employer]

## 2022-01-26 NOTE — Telephone Encounter (Signed)
The patient Jasmin Lewis in [place]. I placed the referral electronically on 01/25/22 to her.

## 2022-01-26 NOTE — Addendum Note (Signed)
Addended by: Juanetta Beets on: 01/26/2022 11:56 AM   Modules accepted: Orders

## 2022-01-27 ENCOUNTER — Telehealth: Payer: Self-pay

## 2022-01-27 NOTE — Telephone Encounter (Signed)
I spoke with pt. She states, "I couldn't tell a bit of difference at all. I ate a pretty good breakfast this morning. I've been up and down a few times answering the phone". She mentioned she has an appt with lymphedema specialist in [place]. Pt reminded to call us if she develops temp of 100.4 or higher, day or night. She verbalized understanding.

## 2022-01-28 ENCOUNTER — Encounter: Payer: Self-pay | Admitting: Occupational Therapy

## 2022-01-28 ENCOUNTER — Ambulatory Visit: Payer: Medicare Other | Attending: Oncology | Admitting: Occupational Therapy

## 2022-01-28 DIAGNOSIS — I972 Postmastectomy lymphedema syndrome: Secondary | ICD-10-CM | POA: Diagnosis present

## 2022-01-28 DIAGNOSIS — M6281 Muscle weakness (generalized): Secondary | ICD-10-CM | POA: Diagnosis present

## 2022-01-28 DIAGNOSIS — C7951 Secondary malignant neoplasm of bone: Secondary | ICD-10-CM | POA: Diagnosis not present

## 2022-01-28 DIAGNOSIS — M25642 Stiffness of left hand, not elsewhere classified: Secondary | ICD-10-CM | POA: Diagnosis present

## 2022-01-28 NOTE — Therapy (Signed)
[facility] [address] [place], [place], [address] Phone: [phone]   Fax:  [phone]  Occupational Therapy Evaluation  Patient Details  Name: Jasmin Lewis MRN: [id number] Date of Birth: [birth year] Referring Provider (OT): Elliot Cousin   Encounter Date: 01/28/2022   OT End of Session - 01/28/22 1314     Visit Number 1    Number of Visits 12    Date for OT Re-Evaluation 04/22/22    OT Start Time 1120    OT Stop Time 1245    OT Time Calculation (min) 85 min    Activity Tolerance Patient tolerated treatment well    Behavior During Therapy [facility] for tasks assessed/performed             Past Medical History:  Diagnosis Date   Arthritis    shoulder   Breast cancer ([place])    Cancer ([place])    left breast ca   Colon cancer ([place])    [name], stage II 11/12/2011   Sigmoid lesion 5.9 cm  40 nodes negative but focus of cancer in a diverticum   Pre-op CEA 5.2 with lab normal up to 2.5 resected 09/21/05   Xeloda adjuvant chemotherapy   Hypertension     Past Surgical History:  Procedure Laterality Date   COLONOSCOPY     HEMICOLECTOMY  2007   MASTECTOMY WITH AXILLARY LYMPH NODE DISSECTION Left 03/14/2019   Procedure: LEFT MASTECTOMY WITH TARGETED LYMPH NODE DISSECTION;  Surgeon: Jovita Kussmaul, MD;  Location: [place] [facility];  Service: General;  Laterality: Left;   SENTINEL NODE BIOPSY Left 03/14/2019   Procedure: Left Axillary Sentinel Lymph  Node Biopsy;  Surgeon: Jovita Kussmaul, MD;  Location: [facility];  Service: General;  Laterality: Left;   TONSILLECTOMY     WISDOM TOOTH EXTRACTION      There were no vitals filed for this visit.   Subjective Assessment - 01/28/22 2101     Subjective  I had treatment for my lymphedema my left arm around November - December 2022.  Was fitted with a sleeve but it was too long.  Type of got gradually worse since January.  And then about 6 weeks ago was stung by a wasp on my  left hand.  So my hand got worse to.  But now I cannot make a fist or use my left hand.  I need some help.  I had my first chemo this week.  Getting every 3 weeks.    Pertinent History Malignant neoplasm of upper-outer quadrant of left breast in female, estrogen receptor negative ([place])  Stage IIB (T2c N1 M0) HER2 receptor positive left breast cancer, diagnosed in June 2020. This was ER/PR negative. This was treated with neoadjuvant Kadcyla/Perjeta and left mastectomy, with a complete response in breast and nodes. She had only received two doses of adjuvant Herceptin before being discontinued in December 2020.     History of colon cancer, stage II  History of stage II colon cancer, diagnosed in May 2007. This was treated with surgical resection and adjuvant chemotherapy Malignant neoplasm metastatic to liver [facility])  Liver metastases discovered 04/17/21.  Numerous heterogeneously enhancing, internally necrotic lesions in the right hepatic lobe, consistent with metastatic disease. Largest lesion measures up to 6.7 cm and there are at least 6 lesions. We now have biopsy proven HER2 positive recurrent breast cancer with ER/PR negative. She is receiving THP.CT scan now shows excellent response with significant decrease in  the size of her liver lesions.     Malignant neoplasm metastatic to bone [facility])  Bone metastases discovered 04/17/21. Enhancing osseous lesions are present in the L2 vertebral body, both iliac bones, and in the sacrum, highly concerning for osseous metastases. She received her 1st dose of zoledronic acid on January 16th.  I once again today explained to her and her son the reasoning for this medicine.  Her widespread bone metastases show an interval increase in sclerosis of previously lytic lesions consistent with treatment response.     Chest wall recurrence of breast cancer, left ([name])  Left supraclavicular lymph node measuring 2-3 cm. Also involvement of subpectoral node and chest wall skin. This was  rapidly progressive despite receiving  trastuzumab.  We have added in Perjeta and Taxotere to her current regimen and this has been very effective.  The CT scan also shows good response of her adenopathy with resolution of the previous bulky left axillary and subpectoral adenopathy. Today, I do not appreciate axillary nodes. However, she does have a 2 cm nodule noted to the left chest that is new. This is located close to the left shoulder. Of note, she does have existing lymphedema to the left arm; however, this is worse today, most likely from a bee sting she suffered to the left hand. Her arm appears red and is warm to touch. We will treat this with antibiotics.  Refer to lymphedema clinic.    Patient Stated Goals I just want to see if I can get the swelling better in my left arm and hand, in the motion, strength and use of my left dominant hand.    Currently in Pain? Yes    Pain Score --   numbness in fingers tips              [facility] OT Assessment - 01/28/22 0001       Assessment   Medical Diagnosis L UE lymphedema    Referring Provider (OT) Anabel Bene, C    Onset Date/Surgical Date 04/09/21    Hand Dominance Left    Prior Therapy report in Dec/Jan had lymphedema tx at [facility]      Precautions   Precaution Comments L UE lymphedema      Home  Environment   Lives With Family      Prior Function   Vocation Retired    Leisure husband - not healthy/son and next week having caregiver coming in to help her - bird watching,              LYMPHEDEMA/ONCOLOGY QUESTIONNAIRE - 01/28/22 0001       Right Upper Extremity Lymphedema   15 cm Proximal to Olecranon Process 25 cm    10 cm Proximal to Olecranon Process 25 cm    Olecranon Process 22 cm    15 cm Proximal to Ulnar Styloid Process 20.5 cm    10 cm Proximal to Ulnar Styloid Process 17 cm    Just Proximal to Ulnar Styloid Process 14 cm    Across Hand at [employer] 17.4 cm      Left Upper Extremity Lymphedema   15 cm  Proximal to Olecranon Process 29 cm    10 cm Proximal to Olecranon Process 28.2 cm    Olecranon Process 26 cm    15 cm Proximal to Ulnar Styloid Process 25.2 cm    10 cm Proximal to Ulnar Styloid Process 19.5 cm    Just Proximal to Ulnar Styloid Process 16 cm  Across Hand at [employer] 19 cm                Patient and son was educated on the home program to do for a week to left upper extremity. Apply Eucerin lotion and Isotoner glove. Soft stockinette on left upper extremity prior to 6 cm short stretch bandage starting at the wrist, through the webspace over the hand and figure eights up to proximal to elbow    8 cm short stretch bandage anchored around the wrist through the webspace over the hand and circular overlap 50% up to axilla. To be removed 1 time a day for showering as well as home exercises after contrast to the left hand and wrist. Can keep it off for 2 hours while doing wrist flexion extension as well as radial ulnar deviation and pronation supination 10 reps. Active assisted range of motion for MC flexion as well as PIP flexion 12 reps. And thumb palmar radial abduction 12 reps active assisted range.  With bandages on patient can perform shoulder active range of motion as well as elbow flexion extension and forearm and wrist finger exercises 3 times a day.            OT Education - 01/28/22 1314     Education Details Findings of evaluation, lymphedema education as well as bandaging and home program    Person(s) Educated Patient;Child(ren)    Methods Explanation;Demonstration;Tactile cues;Verbal cues;Handout    Comprehension Verbal cues required;Returned demonstration;Verbalized understanding                 OT Long Term Goals - 01/28/22 2117       OT LONG TERM GOAL #1   Title Patient and son demo understanding to do home program to decrease circumference in left upper extremity to get measured for appropriate compression garment for home  use.    Baseline No knowledge of bandaging and safely donning compression garment because of weakness in digits and wrist extension.  As well as flexion.    Time 4    Period Weeks    Status New    Target Date 02/25/22      OT LONG TERM GOAL #2   Title Left upper extremity circumference decreased by 2 cm in forearm and upper arm and 1 cm in hand and forearm to be fitted for appropriate compression garment to use safely at home to prevent infection.    Baseline No knowledge of home program to maintain circumference and prevent infection.    Time 6    Period Weeks    Status New    Target Date 03/11/22      OT LONG TERM GOAL #3   Title Patient show increase  L forearm pronation, wrist flexion and digits flexion in left arm to initiate use of left hand and ADLs.    Baseline Patient unable to use left hand and bathing and dressing or any ADLs unable to initiate digit flexion to make a fist or pinch.    Time 8    Period Weeks    Status New    Target Date 03/25/22                   Plan - 01/28/22 1316     Clinical Impression Statement Patient presented OT evaluation with a diagnosis of left upper extremity lymphedema.  Patient report lymphedema started about December 2022.  Was fitted with a compression sleeve.  But it.  Got worse gradually  with hand worsening after being stung by a wasp on the left hand about 6 weeks ago. Chest wall recurrence of breast cancer, left ([name])  Left supraclavicular lymph node measuring 2-3 cm. Also involvement of subpectoral node and chest wall skin. This was rapidly progressive despite receiving  trastuzumab.  They added in Perjeta and Taxotere to her current regimen and this has been very effective.  The CT scan also shows good response of her adenopathy with resolution of the previous bulky left axillary and subpectoral adenopathy.  With 2 cm nodule noted to the left chest that is new. This is located close to the left shoulder.  Patient was treated with  antibiotics after wasp sting.  Patient is left-hand dominant and upon circumference measurement patient hand is increased by 2.4 cm; wrist 2 cm; forearm 4.7 cm; elbow 4 cm and upper arm 4 cm increased compared to the right upper extremity.  Patient shoulder active range of motion appear within functional limits.  Patient with decreased AROM and strength in pronation, FCU, thumb FLP, opposition and digits flexion. Pt unable to make fist - not or little flexion in [facility] or PIP flexion, do have FPB and PA of thumb. Pt verbalized numbness and pins-and-needles in all digits and hand.  At this time question patient having some carpal tunnel symptoms because of increased circumference causing stiffness and weakness in left hand.  Questioning reason for weakness in proximal muscles for pronation as well as wrist flexion.  Patient to hold off on wearing her old compression sleeve.  Unable to resist with digit extension to don sleeve safely.  Patient was fitted with a Isotoner glove and stockinette.  Patient and son educated on doing the 1 layer bandage with a gradient compression from hand to axilla to decrease circumference.  Patient can benefit from skilled OT services to decrease circumference of left upper extremity and possibly increased range of motion/strength to increase functional use of left dominant hand for quality of life performing ADLs.    OT Occupational Profile and History Problem Focused Assessment - Including review of records relating to presenting problem    Occupational performance deficits (Please refer to evaluation for details): ADL's;IADL's;Play;Leisure;Social Participation    Body Structure / Function / Physical Skills ADL;Coordination;Sensation;Flexibility;IADL;ROM;Edema;Dexterity;Strength;Decreased knowledge of precautions    Rehab Potential Fair    Clinical Decision Making Several treatment options, min-mod task modification necessary   Cancer recurrence with metastasis - chemo started this  week , increase weakness in llymhedema inL UE   Comorbidities Affecting Occupational Performance: May have comorbidities impacting occupational performance    Modification or Assistance to Complete Evaluation  Min-Moderate modification of tasks or assist with assess necessary to complete eval    OT Frequency 1x / week    OT Duration 12 weeks    OT Treatment/Interventions Self-care/ADL training;Manual lymph drainage;Compression bandaging;Therapeutic exercise;Contrast Bath;Manual Therapy;Patient/family education;Passive range of motion    Consulted and Agree with Plan of Care Patient;Family member/caregiver             Patient will benefit from skilled therapeutic intervention in order to improve the following deficits and impairments:   Body Structure / Function / Physical Skills: ADL, Coordination, Sensation, Flexibility, IADL, ROM, Edema, Dexterity, Strength, Decreased knowledge of precautions       Visit Diagnosis: Postmastectomy lymphedema syndrome  Muscle weakness (generalized)  Stiffness of left hand, not elsewhere classified    Problem List Patient Active Problem List   Diagnosis Date Noted   Secondary malignant neoplasm of bone  and bone marrow ([place]) 09/07/2021   Chest wall recurrence of breast cancer, left ([place]) 06/24/2021    Class: Diagnosis of   Dehydration 05/25/2021   Malignant neoplasm metastatic to liver ([place]) 05/15/2021    Class: Diagnosis of   Malignant neoplasm metastatic to bone ([place]) 05/15/2021    Class: Acute   Hypercalcemia 05/15/2021    Class: Acute   Brachial plexus injury, left 04/27/2021    Class: Chronic   Malignant neoplasm of upper-outer quadrant of left breast in female, estrogen receptor negative ([place]) 11/02/2018   Colon polyps 05/19/2018    Class: History of   History of colon cancer, stage II 11/12/2011    Rosalyn Gess, OTR/L,CLT 01/28/2022, 9:20 PM  [facility] [address] [place], [address] Phone: [phone]   Fax:  [phone]  Name: Jasmin Lewis MRN: [id number] Date of Birth: 08/01/1931

## 2022-02-02 ENCOUNTER — Ambulatory Visit: Payer: Medicare Other | Admitting: Occupational Therapy

## 2022-02-02 DIAGNOSIS — I972 Postmastectomy lymphedema syndrome: Secondary | ICD-10-CM | POA: Diagnosis not present

## 2022-02-02 DIAGNOSIS — M6281 Muscle weakness (generalized): Secondary | ICD-10-CM

## 2022-02-02 DIAGNOSIS — M25642 Stiffness of left hand, not elsewhere classified: Secondary | ICD-10-CM

## 2022-02-02 NOTE — Therapy (Signed)
[facility] [address], [place], [address] Phone: [phone]   Fax:  [phone]  Occupational Therapy Treatment  Patient Details  Name: Jasmin Lewis MRN: [id number] Date of Birth: 1932-01-02 Referring Provider (OT): Elliot Cousin   Encounter Date: 02/02/2022   OT End of Session - 02/02/22 1439     Visit Number 2    Number of Visits 12    Date for OT Re-Evaluation 04/22/22    OT Start Time 1350    OT Stop Time 1430    OT Time Calculation (min) 40 min    Activity Tolerance Patient tolerated treatment well    Behavior During Therapy [facility] for tasks assessed/performed             Past Medical History:  Diagnosis Date   Arthritis    shoulder   Breast cancer ([place])    Cancer ([place])    left breast ca   Colon cancer ([place])    [name], stage II 11/12/2011   Sigmoid lesion 5.9 cm  40 nodes negative but focus of cancer in a diverticum   Pre-op CEA 5.2 with lab normal up to 2.5 resected 09/21/05   Xeloda adjuvant chemotherapy   Hypertension     Past Surgical History:  Procedure Laterality Date   COLONOSCOPY     HEMICOLECTOMY  2007   MASTECTOMY WITH AXILLARY LYMPH NODE DISSECTION Left 03/14/2019   Procedure: LEFT MASTECTOMY WITH TARGETED LYMPH NODE DISSECTION;  Surgeon: Jovita Kussmaul, MD;  Location: [facility];  Service: General;  Laterality: Left;   SENTINEL NODE BIOPSY Left 03/14/2019   Procedure: Left Axillary Sentinel Lymph  Node Biopsy;  Surgeon: Jovita Kussmaul, MD;  Location: [facility];  Service: General;  Laterality: Left;   TONSILLECTOMY     WISDOM TOOTH EXTRACTION      There were no vitals filed for this visit.   Subjective Assessment - 02/02/22 1437     Subjective  My husband been in the hospital -and I have to do things for him and he moves better than me - did had caregiver to came and help me with bath - we did do bandages about 3 days and compression sleeve with glove - done the  exercises - swelling is down some    Pertinent History Malignant neoplasm of upper-outer quadrant of left breast in female, estrogen receptor negative ([place])  Stage IIB (T2c N1 M0) HER2 receptor positive left breast cancer, diagnosed in June 2020. This was ER/PR negative. This was treated with neoadjuvant Kadcyla/Perjeta and left mastectomy, with a complete response in breast and nodes. She had only received two doses of adjuvant Herceptin before being discontinued in December 2020.     History of colon cancer, stage II  History of stage II colon cancer, diagnosed in May 2007. This was treated with surgical resection and adjuvant chemotherapy Malignant neoplasm metastatic to liver [facility])  Liver metastases discovered 04/17/21.  Numerous heterogeneously enhancing, internally necrotic lesions in the right hepatic lobe, consistent with metastatic disease. Largest lesion measures up to 6.7 cm and there are at least 6 lesions. We now have biopsy proven HER2 positive recurrent breast cancer with ER/PR negative. She is receiving THP.CT scan now shows excellent response with significant decrease in the size of her liver lesions.     Malignant neoplasm metastatic to bone [facility])  Bone metastases discovered 04/17/21. Enhancing osseous lesions are present in the L2 vertebral body, both iliac bones,  and in the sacrum, highly concerning for osseous metastases. She received her 1st dose of zoledronic acid on January 16th.  I once again today explained to her and her son the reasoning for this medicine.  Her widespread bone metastases show an interval increase in sclerosis of previously lytic lesions consistent with treatment response.     Chest wall recurrence of breast cancer, left ([name])  Left supraclavicular lymph node measuring 2-3 cm. Also involvement of subpectoral node and chest wall skin. This was rapidly progressive despite receiving  trastuzumab.  We have added in Perjeta and Taxotere to her current regimen and this has been  very effective.  The CT scan also shows good response of her adenopathy with resolution of the previous bulky left axillary and subpectoral adenopathy. Today, I do not appreciate axillary nodes. However, she does have a 2 cm nodule noted to the left chest that is new. This is located close to the left shoulder. Of note, she does have existing lymphedema to the left arm; however, this is worse today, most likely from a bee sting she suffered to the left hand. Her arm appears red and is warm to touch. We will treat this with antibiotics.  Refer to lymphedema clinic.    Patient Stated Goals I just want to see if I can get the swelling better in my left arm and hand, in the motion, strength and use of my left dominant hand.    Currently in Pain? Yes   numbness in fingers tips and pain with PROM of digits- no nr provided                LYMPHEDEMA/ONCOLOGY QUESTIONNAIRE - 02/02/22 0001       Left Upper Extremity Lymphedema   15 cm Proximal to Olecranon Process 27 cm    10 cm Proximal to Olecranon Process 27 cm    Olecranon Process 26 cm    15 cm Proximal to Ulnar Styloid Process 25.5 cm    10 cm Proximal to Ulnar Styloid Process 20.4 cm    Just Proximal to Ulnar Styloid Process 15.4 cm    Across Hand at Thumb Web Space 17.4 cm              Pt arrive with no compression on L UE - report husband was sick and in ER and hospital - they were running around - could not do HEP as they should Had bandages on for about 3 days- then some compression sleeve and glove -did sleep one night with it - reinforce NOT to sleep with compression sleeve   Patient and son ask for OT to review again  home program to do for a week to left upper extremity. Apply Eucerin lotion and Isotoner glove. Soft stockinette on left upper extremity prior to 6 cm short stretch bandage starting at the wrist, through the webspace over the hand and figure eights up to proximal to elbow     8 cm short stretch bandage  anchored around the wrist through the webspace over the hand and circular overlap 50% up to axilla.  To be removed 1 time a day for showering as well as home exercises after contrast to the left hand and wrist. Can keep it off for 2 hours while doing wrist flexion extension as well as radial ulnar deviation and pronation supination 10 reps. Pt still need PROM for pronation Done this date PROM prior to bandaging by OT MC flexion, intrinsic fist and composite flexion in   painfree range  Followed by Place and hold - pt could do about 2  5 reps of place and hold 1/4 of range - initiating 3rd and 4th PIP Could do AROM to 45-60 MC flexion   Pt and son to cont with Active assisted range of motion for MC flexion as well as PIP flexion 12 reps. Followed by composite pain free PROM flexion digits And then place and hold   And thumb palmar radial abduction 12 reps active assisted range. Done also PROM to thumb IP and MC flexion   With bandages on patient can perform shoulder active range of motion as well as elbow flexion extension and forearm and wrist finger exercises 3 times a day.                 OT Education - 02/02/22 1438     Education Details progress and changes to HEP    Person(s) Educated Patient;Child(ren)    Methods Explanation;Demonstration;Tactile cues;Verbal cues;Handout    Comprehension Verbal cues required;Returned demonstration;Verbalized understanding                 OT Long Term Goals - 01/28/22 2117       OT LONG TERM GOAL #1   Title Patient and son demo understanding to do home program to decrease circumference in left upper extremity to get measured for appropriate compression garment for home use.    Baseline No knowledge of bandaging and safely donning compression garment because of weakness in digits and wrist extension.  As well as flexion.    Time 4    Period Weeks    Status New    Target Date 02/25/22      OT LONG TERM GOAL #2   Title Left  upper extremity circumference decreased by 2 cm in forearm and upper arm and 1 cm in hand and forearm to be fitted for appropriate compression garment to use safely at home to prevent infection.    Baseline No knowledge of home program to maintain circumference and prevent infection.    Time 6    Period Weeks    Status New    Target Date 03/11/22      OT LONG TERM GOAL #3   Title Patient show increase  L forearm pronation, wrist flexion and digits flexion in left arm to initiate use of left hand and ADLs.    Baseline Patient unable to use left hand and bathing and dressing or any ADLs unable to initiate digit flexion to make a fist or pinch.    Time 8    Period Weeks    Status New    Target Date 03/25/22                   Plan - 02/02/22 1439     Clinical Impression Statement Patient presented OT evaluation with a diagnosis of left upper extremity lymphedema.  Patient report lymphedema started about December 2022.  Was fitted with a compression sleeve.  But it.  Got worse gradually with hand worsening after being stung by a wasp on the left hand about 7 weeks ago. Chest wall recurrence of breast cancer, left ([name])  Left supraclavicular lymph node measuring 2-3 cm. Also involvement of subpectoral node and chest wall skin. This was rapidly progressive despite receiving  trastuzumab.  They added in Perjeta and Taxotere to her current regimen and this has been very effective.  The CT scan also shows good response of her adenopathy with resolution   of the previous bulky left axillary and subpectoral adenopathy.  With 2 cm nodule noted to the left chest that is new. This is located close to the left shoulder.  Patient was treated with antibiotics after wasp sting.  Patient is left-hand dominant - since compression was initiated last week - L UE circumference  decrease in upper arm and wrist /hand and pt showed increase PROM in L hand digits this date and was able to do some place and hold. Also  showed AROM MC flexion 45 5th and 60 at 2nd thru 4th digits.  Patient shoulder active range of motion appear WFL but still decrease strength and AROM in pronation more than -  FCU, thumb FLP, opposition and digits flexion. Pt unable to make fist.. Pt verbalized numbness and pins-and-needles in all digits and hand.  At this time question patient having some carpal tunnel symptoms because of increased circumference causing stiffness and weakness in left hand.  Questioning reason for weakness in proximal muscles for pronation as well as wrist flexion.  Patient to hold off on wearing her old compression sleeve.  Unable to resist with digit extension to don sleeve safely.  Patient was fitted with a Isotoner glove and stockinette again today.  Patient and son  reeducated on doing the 1 layer bandage with a gradient compression from hand to axilla to decrease circumference.  Patient can benefit from skilled OT services to decrease circumference of left upper extremity and possibly increased range of motion/strength to increase functional use of left dominant hand for quality of life performing ADLs.    OT Occupational Profile and History Problem Focused Assessment - Including review of records relating to presenting problem    Occupational performance deficits (Please refer to evaluation for details): ADL's;IADL's;Play;Leisure;Social Participation    Body Structure / Function / Physical Skills ADL;Coordination;Sensation;Flexibility;IADL;ROM;Edema;Dexterity;Strength;Decreased knowledge of precautions    Rehab Potential Fair    Clinical Decision Making Several treatment options, min-mod task modification necessary    Comorbidities Affecting Occupational Performance: May have comorbidities impacting occupational performance    Modification or Assistance to Complete Evaluation  Min-Moderate modification of tasks or assist with assess necessary to complete eval    OT Frequency 1x / week    OT Duration 12 weeks    OT  Treatment/Interventions Self-care/ADL training;Manual lymph drainage;Compression bandaging;Therapeutic exercise;Contrast Bath;Manual Therapy;Patient/family education;Passive range of motion    Consulted and Agree with Plan of Care Patient;Family member/caregiver             Patient will benefit from skilled therapeutic intervention in order to improve the following deficits and impairments:   Body Structure / Function / Physical Skills: ADL, Coordination, Sensation, Flexibility, IADL, ROM, Edema, Dexterity, Strength, Decreased knowledge of precautions       Visit Diagnosis: Postmastectomy lymphedema syndrome  Muscle weakness (generalized)  Stiffness of left hand, not elsewhere classified    Problem List Patient Active Problem List   Diagnosis Date Noted   Secondary malignant neoplasm of bone and bone marrow ([place]) 09/07/2021   Chest wall recurrence of breast cancer, left ([place]) 06/24/2021    Class: Diagnosis of   Dehydration 05/25/2021   Malignant neoplasm metastatic to liver ([place]) 05/15/2021    Class: Diagnosis of   Malignant neoplasm metastatic to bone ([place]) 05/15/2021    Class: Acute   Hypercalcemia 05/15/2021    Class: Acute   Brachial plexus injury, left 04/27/2021    Class: Chronic   Malignant neoplasm of upper-outer quadrant of left breast in female, estrogen  receptor negative ([name]) 11/02/2018   Colon polyps 05/19/2018    Class: History of   History of colon cancer, stage II 11/12/2011    DuPreez, Maureen, OTR/L,CLT 02/02/2022, 2:43 PM  [facility] [address] [place], [place], [address] Phone: [phone]   Fax:  [phone]  Name: Jasmin Lewis MRN: [id number] Date of Birth: 03/04/1932  

## 2022-02-05 ENCOUNTER — Encounter: Payer: Self-pay | Admitting: Oncology

## 2022-02-05 DIAGNOSIS — I890 Lymphedema, not elsewhere classified: Secondary | ICD-10-CM | POA: Insufficient documentation

## 2022-02-09 ENCOUNTER — Ambulatory Visit: Payer: Medicare Other | Attending: Oncology | Admitting: Occupational Therapy

## 2022-02-09 DIAGNOSIS — M25642 Stiffness of left hand, not elsewhere classified: Secondary | ICD-10-CM | POA: Insufficient documentation

## 2022-02-09 DIAGNOSIS — I972 Postmastectomy lymphedema syndrome: Secondary | ICD-10-CM | POA: Diagnosis not present

## 2022-02-09 DIAGNOSIS — M6281 Muscle weakness (generalized): Secondary | ICD-10-CM | POA: Insufficient documentation

## 2022-02-09 NOTE — Therapy (Signed)
[facility] [address], [place], [address] Phone: [phone]   Fax:  [phone]  Occupational Therapy Treatment  Patient Details  Name: Jasmin Lewis MRN: [id number] Date of Birth: [birth year] Referring Provider (OT): Elliot Cousin   Encounter Date: 02/09/2022   OT End of Session - 02/09/22 1312     Visit Number 3    Number of Visits 12    Date for OT Re-Evaluation 04/22/22    OT Start Time 1470    OT Stop Time 9295    OT Time Calculation (min) 70 min    Activity Tolerance Patient tolerated treatment well    Behavior During Therapy [facility] for tasks assessed/performed             Past Medical History:  Diagnosis Date   Arthritis    shoulder   Breast cancer ([place])    Cancer ([place])    left breast ca   Colon cancer ([place])    [name], stage II 11/12/2011   Sigmoid lesion 5.9 cm  40 nodes negative but focus of cancer in a diverticum   Pre-op CEA 5.2 with lab normal up to 2.5 resected 09/21/05   Xeloda adjuvant chemotherapy   Hypertension     Past Surgical History:  Procedure Laterality Date   COLONOSCOPY     HEMICOLECTOMY  2007   MASTECTOMY WITH AXILLARY LYMPH NODE DISSECTION Left 03/14/2019   Procedure: LEFT MASTECTOMY WITH TARGETED LYMPH NODE DISSECTION;  Surgeon: Jovita Kussmaul, MD;  Location: [place];  Service: General;  Laterality: Left;   SENTINEL NODE BIOPSY Left 03/14/2019   Procedure: Left Axillary Sentinel Lymph  Node Biopsy;  Surgeon: Jovita Kussmaul, MD;  Location: [place];  Service: General;  Laterality: Left;   TONSILLECTOMY     WISDOM TOOTH EXTRACTION      There were no vitals filed for this visit.   Subjective Assessment - 02/09/22 1311     Subjective  I kept the bandages on for 48 hrs after we left - but my glove gets to tight and look at my arm now worse- we done it couple of times I just don't want to do it - my husband needs me - I am the only woman in the house     Pertinent History Malignant neoplasm of upper-outer quadrant of left breast in female, estrogen receptor negative ([place])  Stage IIB (T2c N1 M0) HER2 receptor positive left breast cancer, diagnosed in June 2020. This was ER/PR negative. This was treated with neoadjuvant Kadcyla/Perjeta and left mastectomy, with a complete response in breast and nodes. She had only received two doses of adjuvant Herceptin before being discontinued in December 2020.     History of colon cancer, stage II  History of stage II colon cancer, diagnosed in May 2007. This was treated with surgical resection and adjuvant chemotherapy Malignant neoplasm metastatic to liver [facility])  Liver metastases discovered 04/17/21.  Numerous heterogeneously enhancing, internally necrotic lesions in the right hepatic lobe, consistent with metastatic disease. Largest lesion measures up to 6.7 cm and there are at least 6 lesions. We now have biopsy proven HER2 positive recurrent breast cancer with ER/PR negative. She is receiving THP.CT scan now shows excellent response with significant decrease in the size of her liver lesions.     Malignant neoplasm metastatic to bone [facility])  Bone metastases discovered 04/17/21. Enhancing osseous lesions are present in the L2 vertebral body, both iliac bones, and  in the sacrum, highly concerning for osseous metastases. She received her 1st dose of zoledronic acid on January 16th.  I once again today explained to her and her son the reasoning for this medicine.  Her widespread bone metastases show an interval increase in sclerosis of previously lytic lesions consistent with treatment response.     Chest wall recurrence of breast cancer, left ([name])  Left supraclavicular lymph node measuring 2-3 cm. Also involvement of subpectoral node and chest wall skin. This was rapidly progressive despite receiving  trastuzumab.  We have added in Perjeta and Taxotere to her current regimen and this has been very effective.  The CT scan also shows  good response of her adenopathy with resolution of the previous bulky left axillary and subpectoral adenopathy. Today, I do not appreciate axillary nodes. However, she does have a 2 cm nodule noted to the left chest that is new. This is located close to the left shoulder. Of note, she does have existing lymphedema to the left arm; however, this is worse today, most likely from a bee sting she suffered to the left hand. Her arm appears red and is warm to touch. We will treat this with antibiotics.  Refer to lymphedema clinic.    Patient Stated Goals I just want to see if I can get the swelling better in my left arm and hand, in the motion, strength and use of my left dominant hand.                 LYMPHEDEMA/ONCOLOGY QUESTIONNAIRE - 02/09/22 0001       Left Upper Extremity Lymphedema   10 cm Proximal to Olecranon Process 28 cm    Olecranon Process 25 cm    15 cm Proximal to Ulnar Styloid Process 25.4 cm    10 cm Proximal to Ulnar Styloid Process 22 cm    Just Proximal to Ulnar Styloid Process 17.4 cm    Across Hand at [employer] 19 cm                Pt arrive with no compression on L UE -  Pt report husband still needs a lot of attention from her- even with her having on hand- could not do HEP as they should Had bandages on for about 48hrs- and done compression couple of times- but feel glove to tight and hand hurts    Measurement increase greatly again in circumference  -and fibrosis and pitting edema in forearm and prox to elbow  Done extended MLD and fibrotic tech to L UE elevated on table - and measurements in hand to forearm decrease by 1- 1 1/2 cm -  ed pt and son on doing some of the MLD at home   Fitted with pt in med isotoner glove but light compression around digits -  Change pt's daytime compression to isotoner glove and Tubigrip D from hand to elbow , and Tubigrip E from mid forearm to axilla  Also reviewed in session HEP for AAROM and PROM after MLD to digits  MC flexion, PIP /intrinsic fist , wrist flexion, ext, RD< UD , sup /pro ; elbow flexion, ext and shoulder flexion PROM - Pt can do in supine too Did told pt 3 times during day to do HEP - 9am , 1pm and 6 pm   Pt feels like she can be more compliant with times  Patient and son ed on change to night time - to do  Apply Eucerin lotion and  Isotoner glove. Soft stockinette on left upper extremity prior  - fitted with Rosidal foam from hand to axilla over lap 50%  Followed by  6 cm short stretch bandage starting at the wrist, through the webspace over the hand and figure eights up to proximal to elbow     8 cm short stretch bandage anchored around the wrist through the webspace over the hand and circular overlap 50% up to axilla.                             OT Education - 02/09/22 1311     Education Details progress and changes to HEP    Person(s) Educated Patient;Child(ren)    Methods Explanation;Demonstration;Tactile cues;Verbal cues;Handout    Comprehension Verbal cues required;Returned demonstration;Verbalized understanding                 OT Long Term Goals - 01/28/22 2117       OT LONG TERM GOAL #1   Title Patient and son demo understanding to do home program to decrease circumference in left upper extremity to get measured for appropriate compression garment for home use.    Baseline No knowledge of bandaging and safely donning compression garment because of weakness in digits and wrist extension.  As well as flexion.    Time 4    Period Weeks    Status New    Target Date 02/25/22      OT LONG TERM GOAL #2   Title Left upper extremity circumference decreased by 2 cm in forearm and upper arm and 1 cm in hand and forearm to be fitted for appropriate compression garment to use safely at home to prevent infection.    Baseline No knowledge of home program to maintain circumference and prevent infection.    Time 6    Period Weeks    Status New    Target Date  03/11/22      OT LONG TERM GOAL #3   Title Patient show increase  L forearm pronation, wrist flexion and digits flexion in left arm to initiate use of left hand and ADLs.    Baseline Patient unable to use left hand and bathing and dressing or any ADLs unable to initiate digit flexion to make a fist or pinch.    Time 8    Period Weeks    Status New    Target Date 03/25/22                   Plan - 02/09/22 1312     Clinical Impression Statement Patient presented OT evaluation with a diagnosis of left upper extremity lymphedema.  Patient report lymphedema started about December 2022.  Was fitted with a compression sleeve.  But it got worse gradually with hand worsening after being stung by a wasp on the left hand few months after that.. Chest wall recurrence of breast cancer, left ([name])  Left supraclavicular lymph node measuring 2-3 cm. Also involvement of subpectoral node and chest wall skin. This was rapidly progressive despite receiving  trastuzumab.  They added in Perjeta and Taxotere to her current regimen and this has been very effective.  The CT scan also shows good response of her adenopathy with resolution of the previous bulky left axillary and subpectoral adenopathy.  With 2 cm nodule noted to the left chest that is new. This is located close to the left shoulder.  Patient was treated with antibiotics after  wasp sting.  Patient is left-hand dominant - since compression was initiated  at eval  L UE circumference  decrease in upper arm and wrist /hand  last time but this date pt arrive with increase circumference again in L UE with fibrosis and pitting edema in forearm and proximalto elbow. Done some fibrotic tech and MLD to L UE in elevated position follow by PROM/AAROM to L  UE all joints including hand digits with great progress in circumference. Was able to do again place and hold partial fist in session. Pt verbalized numbness and pins-and-needles in all digits and hand.  Appear pt  has carpal tunnel symptoms because of increased edema causing stiffness and weakness in left hand.  Questioning reason for weakness in proximal muscles for pronation as well as wrist flexion.  Patient to hold off on wearing her old compression sleeve.  Unable to resist with digit extension to don sleeve safely.  Change HEP to night time light compression by son and day time glove with tubigrip D and E for light compression.  Patient can benefit from skilled OT services to decrease circumference of left upper extremity and possibly increased range of motion/strength to increase functional use of left dominant hand for quality of life performing ADLs.    OT Occupational Profile and History Problem Focused Assessment - Including review of records relating to presenting problem    Occupational performance deficits (Please refer to evaluation for details): ADL's;IADL's;Play;Leisure;Social Participation    Body Structure / Function / Physical Skills ADL;Coordination;Sensation;Flexibility;IADL;ROM;Edema;Dexterity;Strength;Decreased knowledge of precautions    Rehab Potential Fair    Clinical Decision Making Several treatment options, min-mod task modification necessary    Comorbidities Affecting Occupational Performance: May have comorbidities impacting occupational performance    Modification or Assistance to Complete Evaluation  Min-Moderate modification of tasks or assist with assess necessary to complete eval    OT Frequency 1x / week    OT Duration 12 weeks    OT Treatment/Interventions Self-care/ADL training;Manual lymph drainage;Compression bandaging;Therapeutic exercise;Contrast Bath;Manual Therapy;Patient/family education;Passive range of motion    Consulted and Agree with Plan of Care Patient;Family member/caregiver             Patient will benefit from skilled therapeutic intervention in order to improve the following deficits and impairments:   Body Structure / Function / Physical Skills:  ADL, Coordination, Sensation, Flexibility, IADL, ROM, Edema, Dexterity, Strength, Decreased knowledge of precautions       Visit Diagnosis: Postmastectomy lymphedema syndrome  Muscle weakness (generalized)  Stiffness of left hand, not elsewhere classified    Problem List Patient Active Problem List   Diagnosis Date Noted   Lymphedema of left upper extremity 02/05/2022    Class: Chronic   Secondary malignant neoplasm of bone and bone marrow ([place]) 09/07/2021   Chest wall recurrence of breast cancer, left ([place]) 06/24/2021    Class: Diagnosis of   Dehydration 05/25/2021   Malignant neoplasm metastatic to liver ([place]) 05/15/2021    Class: Diagnosis of   Malignant neoplasm metastatic to bone ([place]) 05/15/2021    Class: Acute   Hypercalcemia 05/15/2021    Class: Acute   Brachial plexus injury, left 04/27/2021    Class: Chronic   Malignant neoplasm of upper-outer quadrant of left breast in female, estrogen receptor negative ([place]) 11/02/2018   Colon polyps 05/19/2018    Class: History of   History of colon cancer, stage II 11/12/2011    Rosalyn Gess, OTR/L,CLT 02/09/2022, 2:57 PM  [facility] [address] [place], [address] Phone: [phone]   Fax:  [phone]  Name: Jasmin Lewis MRN: [id number] Date of Birth: Jun 16, 1931

## 2022-02-10 ENCOUNTER — Other Ambulatory Visit: Payer: Self-pay

## 2022-02-12 ENCOUNTER — Encounter: Payer: Self-pay | Admitting: Oncology

## 2022-02-12 ENCOUNTER — Inpatient Hospital Stay: Payer: Medicare Other

## 2022-02-12 ENCOUNTER — Inpatient Hospital Stay: Payer: Medicare Other | Attending: Oncology | Admitting: Oncology

## 2022-02-12 VITALS — BP 174/72 | HR 86 | Temp 97.7°F | Resp 17 | Ht 60.25 in | Wt 111.1 lb

## 2022-02-12 DIAGNOSIS — Z85038 Personal history of other malignant neoplasm of large intestine: Secondary | ICD-10-CM | POA: Insufficient documentation

## 2022-02-12 DIAGNOSIS — C50912 Malignant neoplasm of unspecified site of left female breast: Secondary | ICD-10-CM | POA: Diagnosis not present

## 2022-02-12 DIAGNOSIS — C7989 Secondary malignant neoplasm of other specified sites: Secondary | ICD-10-CM

## 2022-02-12 DIAGNOSIS — C787 Secondary malignant neoplasm of liver and intrahepatic bile duct: Secondary | ICD-10-CM

## 2022-02-12 DIAGNOSIS — Z9012 Acquired absence of left breast and nipple: Secondary | ICD-10-CM | POA: Insufficient documentation

## 2022-02-12 DIAGNOSIS — C50412 Malignant neoplasm of upper-outer quadrant of left female breast: Secondary | ICD-10-CM

## 2022-02-12 DIAGNOSIS — Z171 Estrogen receptor negative status [ER-]: Secondary | ICD-10-CM

## 2022-02-12 DIAGNOSIS — Z853 Personal history of malignant neoplasm of breast: Secondary | ICD-10-CM | POA: Insufficient documentation

## 2022-02-12 DIAGNOSIS — I890 Lymphedema, not elsewhere classified: Secondary | ICD-10-CM | POA: Insufficient documentation

## 2022-02-12 DIAGNOSIS — C7951 Secondary malignant neoplasm of bone: Secondary | ICD-10-CM

## 2022-02-12 DIAGNOSIS — I10 Essential (primary) hypertension: Secondary | ICD-10-CM | POA: Insufficient documentation

## 2022-02-12 DIAGNOSIS — Z79899 Other long term (current) drug therapy: Secondary | ICD-10-CM | POA: Insufficient documentation

## 2022-02-12 DIAGNOSIS — Z5112 Encounter for antineoplastic immunotherapy: Secondary | ICD-10-CM | POA: Insufficient documentation

## 2022-02-12 DIAGNOSIS — Z9221 Personal history of antineoplastic chemotherapy: Secondary | ICD-10-CM | POA: Insufficient documentation

## 2022-02-12 LAB — HEPATIC FUNCTION PANEL
ALT: 19 U/L (ref 7–35)
AST: 37 — AB (ref 13–35)
Alkaline Phosphatase: 52 (ref 25–125)
Bilirubin, Total: 0.5

## 2022-02-12 LAB — BASIC METABOLIC PANEL
BUN: 17 (ref 4–21)
CO2: 25 — AB (ref 13–22)
Chloride: 107 (ref 99–108)
Creatinine: 0.5 (ref 0.5–1.1)
Glucose: 106
Potassium: 4.6 mEq/L (ref 3.5–5.1)
Sodium: 134 — AB (ref 137–147)

## 2022-02-12 LAB — CBC AND DIFFERENTIAL
HCT: 36 (ref 36–46)
Hemoglobin: 12.1 (ref 12.0–16.0)
Neutrophils Absolute: 3.66
Platelets: 148 10*3/uL — AB (ref 150–400)
WBC: 6

## 2022-02-12 LAB — COMPREHENSIVE METABOLIC PANEL
Albumin: 3.4 — AB (ref 3.5–5.0)
Calcium: 9.2 (ref 8.7–10.7)

## 2022-02-12 LAB — CBC: RBC: 3.65 — AB (ref 3.87–5.11)

## 2022-02-12 NOTE — Progress Notes (Signed)
Patient Care Team: [name], MD as PCP - General (Family Medicine) [name], MD as Consulting Physician (Oncology)  Clinic Day: 02/12/22  Referring physician: [name], MD  ASSESSMENT & PLAN:   Assessment & Plan: Malignant neoplasm of upper-outer quadrant of left breast in female, estrogen receptor negative ([place]) Stage IIB (T2c N1 M0) HER2 receptor positive left breast cancer, diagnosed in June 2020. This was ER/PR negative. This was treated with neoadjuvant Kadcyla/Perjeta and left mastectomy, with a complete response in breast and nodes. She had only received two doses of adjuvant Herceptin before being discontinued in December 2020.   History of colon cancer, stage II History of stage II colon cancer, diagnosed in May 2007. This was treated with surgical resection and adjuvant chemotherapy with Xeloda. Colonoscopy from 2013 revealed 2 polyps which were resected and chronic diverticulosis.   Malignant neoplasm metastatic to liver [facility]) Liver metastases discovered 04/17/21.  Numerous heterogeneously enhancing, internally necrotic lesions in the right hepatic lobe, consistent with metastatic disease. Largest lesion measures up to 6.7 cm and there are at least 6 lesions. We now have biopsy proven HER2 positive recurrent breast cancer with ER/PR negative. She is receiving THP and is tolerating it without significant difficulty. She had improvement in her liver function tests after 1 cycle, and they are now normal.  CT scan showed excellent response with significant decrease in the size of her liver lesions.   Malignant neoplasm metastatic to bone [facility]) Bone metastases discovered 04/17/21. Enhancing osseous lesions are present in the L2 vertebral body, both iliac bones, and in the sacrum, highly concerning for osseous metastases. She received her 1st dose of zoledronic acid on January 16th.  I once again today explained to her and her son the reasoning for this medicine.  Her  widespread bone metastases show an interval increase in sclerosis of previously lytic lesions consistent with treatment response.   Chest wall recurrence of breast cancer, left ([name]) Left supraclavicular lymph node measuring 2-3 cm. Also involvement of subpectoral node and chest wall skin. This was rapidly progressive despite receiving  trastuzumab.  We have added in Perjeta and Taxotere to her current regimen and this has been very effective.  The CT scan also shows good response of her adenopathy with resolution of the previous bulky left axillary and subpectoral adenopathy. In September of 2023, she had a 2 cm nodule  located close to the left shoulder associated with nodularity of the left upper chest wall and appeared to have obvious progression of disease again. She already appears to be responding to the Enhertu.  Lymphedema She does have existing lymphedema to the left arm; however, this is worse today, with significant edema of the hand as well. We will refer her to a lymphedema specialist and she requests one in [place], so we will arrange that. She sleeps with the hand and arm elevated. She has decreased use of her left hand but I think this is due to more than just the lymphedema.   She had definite progression of disease in September, evident on the left upper chest wall again, and so we switched her treatment to Enhertu. She will have her 2nd cycle next week but is clearly responding already. She has seen the lymphedema specialist and is well pleased with her recommendations. I will see her back in 3 weeks with CBC and CMP for  her 3rd cycle of Enhertu. The patient and her son understand the plans discussed today and are in agreement  with them.  She knows to contact our office if she develops concerns prior to her next appointment.     [name], MD  [facility] [address] [place] [address] Dept:  [phone] Dept Fax: [phone]   Orders Placed This Encounter  Procedures   CBC and differential    This external order was created through the Results Console.   CBC    This external order was created through the Results Console.   Basic metabolic panel    This external order was created through the Results Console.   Comprehensive metabolic panel    This external order was created through the Results Console.   Hepatic function panel    This external order was created through the Results Console.      CHIEF COMPLAINT:  CC: An 88 year old female with metastatic breast cancer   Current Treatment:  Planning Enhertu  INTERVAL HISTORY:  Jasmin Lewis is here today for repeat clinical assessment prior to her 2nd cycle of Enhertu. Her biggest problem is losing the use of her left hand and worsening lymphedema of the left arm. She has been seen by a lymphedema specialist in [place], and they are off to a good start.   The disease of her left upper chest wall was progressing again, with a hard 3 cm nodule and multiple smaller nodules of the upper left chest wall.  We switched her to Enhertu and I can see evidence of response already, with the areas softer and smaller. She denies fevers or chills. She denies pain. Her appetite is good. Her weight is stable over the last 3 weeks but some of this is the lymphedema fluid.  I have reviewed the past medical history, past surgical history, social history and family history with the patient and they are unchanged from previous note.  ALLERGIES:  has No Known Allergies.  MEDICATIONS:  Current Outpatient Medications  Medication Sig Dispense Refill   fish oil-omega-3 fatty acids 1000 MG capsule Take 1 g by mouth daily.     gabapentin (NEURONTIN) 100 MG capsule Take 1 capsule (100 mg total) by mouth at bedtime. (Patient not taking: Reported on 03/05/2022) 30 capsule 5   magnesium citrate SOLN Take 0.5 Bottles by mouth as needed for severe  constipation.     ondansetron (ZOFRAN) 4 MG tablet Take 1 tablet (4 mg total) by mouth every 8 (eight) hours as needed for nausea or vomiting. (Patient not taking: Reported on 03/05/2022) 20 tablet 2   polyethylene glycol (MIRALAX / GLYCOLAX) 17 g packet Take 17 g by mouth as needed. constipation     No current facility-administered medications for this visit.    HISTORY OF PRESENT ILLNESS:   Oncology History Overview Note  Cancer Staging Malignant neoplasm of upper-outer quadrant of left breast in female, estrogen receptor negative ([place]) Staging form: Breast, AJCC 8th Edition - Clinical stage from 10/11/2018: Stage IIB (cT2, cN1, cM0, G3, ER-, PR-, HER2+) - Signed by Truitt Merle, MD on 11/02/2018 - Clinical: No stage assigned - Unsigned    History of colon cancer, stage II  09/2005 Initial Diagnosis   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 Findings were a 5.9 x 3.8 x 1.2 cm moderate to well-differentiated adenocarcinoma penetrating the muscular wall. Forty lymph nodes negative. No vascular or lymphatic invasion. Multiple diverticula with microabscess formation  and inflammation. Carcinoma present in at least 1 diverticulum. Preop CEA 5.2 with lab normal range 0 to 2.5. Preop CT scan with no obvious additional pathology.    2007 -  Chemotherapy   She received oral Xeloda chemotherapy as an adjuvant. She declined treatment on a clinical trial.    11/12/2011 Initial Diagnosis   [name], stage II   12/09/2011 Procedure   Followup colonoscopy done on 12/09/2011. She was found to have 2 polyps which were removed. Chronic diverticulosis.     Malignant neoplasm of upper-outer quadrant of left breast in female, estrogen receptor negative ([place])  09/26/2018 Mammogram   Mammogram/US of left breast 09/26/18 IMPRESSION:  1. 2.9cm irregular mass in the upper outer left breast corresponds to the  palpable abnormality. This is highly suspicious for breast carcinoma.  2. Two adjacent abnormal left axillary  Lymph nodes suspicious for metastatic adenopathy. There is a third borderline abnormal left axillary LN with a cortex thickened to 47mm.  3. Benign right breast cyst. No evidence of right breast malignancy.    10/11/2018 Cancer Staging   Staging form: Breast, AJCC 8th Edition - Clinical stage from 10/11/2018: Stage IIB (cT2, cN1, cM0, G3, ER-, PR-, HER2+) - Signed by Truitt Merle, MD on 11/02/2018   10/11/2018 Initial Biopsy   Diagnosis 10/11/18 1. Breast, left, needle core biopsy, upper outer left 2 o'clock - INVASIVE DUCTAL CARCINOMA. - DUCTAL CARCINOMA IN SITU. - LYMPHOVASCULAR INVASION IS IDENTIFIED. - SEE COMMENT. 2. Lymph node, needle/core biopsy, left axilla - INVASIVE DUCTAL CARCINOMA. - SEE COMMENT.   10/11/2018 Receptors her2   The tumor cells are POSITIVE for Her2 (3+). Estrogen Receptor: 0%, NEGATIVE Progesterone Receptor: 0%, NEGATIVE Proliferation Marker Ki67: 70%   11/02/2018 Initial Diagnosis   Malignant neoplasm of upper-outer quadrant of left breast in female, estrogen receptor negative ([place])   11/15/2018 Breast MRI   MRI breast 11/15/18  IMPRESSION: 1. 3.3 centimeter mass in the LATERAL portion of the LEFT breast consistent with known malignancy. 2. There is significant non mass enhancement surrounding this mass and extending anteriorly into the nipple base, with largest diameter in the anterior to posterior axis, measuring 7.2 centimeters. 3. If the patient would consider breast conservation, additional MR guided core biopsies are recommended. Consider biopsy of the inferior and anterior extent of the non mass enhancement to document extent of disease. 4. Three enlarged LEFT axillary lymph nodes. 5. RIGHT breast is negative.   11/15/2018 PET scan   PET 11/15/18 IMPRESSION: Hypermetabolic left breast lesion compatible with known primary. Hypermetabolic left axillary  lymph nodes are consistent with metastatic disease.   No evidence for additional hypermetabolic metastatic involvement in the neck, chest, abdomen, or pelvis.   11/17/2018 - 03/01/2019 Chemotherapy   Neo-adjuvant Kadcyla and perjeta q3weeks for 6 cycles starting 11/17/18. Stopped before surgery    11/29/2018 Pathology Results   Diagnosis 1. Breast, left, needle core biopsy, inferior anterior (cylinder clip) - INVASIVE DUCTAL CARCINOMA. - DUCTAL CARCINOMA IN SITU. - LYMPHOVASCULAR INVASION IS IDENTIFIED. - SEE COMMENT. 2. Breast, left, needle core biopsy, central posterior (barbell clip) - INVASIVE DUCTAL CARCINOMA. - LYMPHOVASCULAR INVASION IS IDENTIFIED.   02/12/2019 Breast MRI   IMPRESSION: 1. Complete resolution of previously identified enhancing mass and associated non mass enhancement within the left breast. This is consistent with excellent response to chemotherapy. No residual or suspicious findings are identified. 2. No MRI evidence of malignancy on the right. 3. Previously identified left axillary lymphadenopathy not definitively seen on today's study.  However, this may be due to decreased field-of-view compared to prior study.     03/14/2019 Cancer Staging   Staging form: Breast, AJCC 8th Edition - Pathologic stage from 03/14/2019: pT0, pN0, cM0, GX, ER: Unknown, PR: Unknown, HER2: Not Assessed - Signed by Truitt Merle, MD on 03/28/2019   03/14/2019 Surgery   LEFT MASTECTOMY WITH TARGETED LYMPH NODE DISSECTION and Left Axillary Sentinel Lymph  Node Biopsy by Dr [name] 03/14/19    03/14/2019 Pathology Results   FINAL MICROSCOPIC DIAGNOSIS:   A. LYMPH NODE, LEFT, SENTINEL, BIOPSY:  - There is no evidence of carcinoma in 1 of 1 lymph node (0/1).   B. BREAST, LEFT, MASTECTOMY:  - Benign breast parenchyma with treatment-related changes.  - There is no evidence of malignancy.  - See oncology table below.   C. LYMPH NODE, LEFT #1, SENTINEL, BIOPSY:  - There is no evidence of  carcinoma in 1 of 1 lymph node (0/1).   D. LYMPH NODE, LEFT #2, SENTINEL, BIOPSY:  - There is no evidence of carcinoma in 1 of 1 lymph node (0/1).     04/04/2019 - 04/25/2019 Chemotherapy   Maintenance Herceptin injections every 3 weeks starting 04/03/19 to complete 1 year of treatment that was started in 11/2018. Stopped after 2nd dose as she will not be repeating [place].    06/03/2021 - 06/24/2021 Chemotherapy   Patient is on Treatment Plan : BREAST Trastuzumab q21d X 11 Cycles     06/25/2021 - 01/01/2022 Chemotherapy   Patient is on Treatment Plan : BREAST Docetaxel + Trastuzumab + Pertuzumab (THP) q21d     06/25/2021 - 01/02/2022 Chemotherapy   Patient is on Treatment Plan : BREAST Docetaxel + Trastuzumab + Pertuzumab (THP) q21d     09/14/2021 Imaging   CT chest/abdomen/pelvis  IMPRESSION:  1. Interval resolution of previously seen bulky left axillary and  subpectoral lymphadenopathy. No persistently enlarged lymph nodes.  2. Multiple liver lesions are significantly diminished in size.  3. Widespread osseous metastatic disease, with an interval increase  in sclerosis of several previously lytic lesions.  4. Constellation of findings is consistent with treatment response  of metastatic disease  5. New, although age indeterminate pathologic wedge deformity of the  T6 vertebral body as well as an increased pathologic wedge deformity  of the L2 vertebral body.  6. Coronary artery disease.  Aortic Atherosclerosis (ICD10-I70.0).    01/26/2022 -  Chemotherapy   Patient is on Treatment Plan : BREAST METASTATIC Fam-Trastuzumab Deruxtecan-nxki (Enhertu) (5.4) q21d     Malignant neoplasm metastatic to liver ([place])  04/17/2021 Imaging   CT ABDOMEN AND PELVIS WITH CONTRAST: -New lytic lesion involving the L2 vertebral body (6:72, 2:26) with minimal cortical breakthrough superiorly, but with preservation of vertebral body height, concerning for metastatic disease.  -There are several indeterminate  hepatic lesions as described above. In addition to the lesions described above, there is an additional subtle 1.5 cm hypodensity in the hepatic dome (2:7). Given clinical history and presence of a new L2 lesion, leading differential consideration is metastatic disease. Recommend further evaluation with MRI of the abdomen with and without contrast.   05/12/2021 Imaging   MRI ABDOMEN WITH AND WITHOUT CONTRAST: Numerous heterogeneously enhancing, internally necrotic lesions in the right hepatic lobe, highly concerning for metastatic disease. Largest lesion measures up to 6.7 cm in hepatic segment VI.   Enhancing osseous lesions in the L2 vertebral body, both iliac bones, and in the sacrum, highly concerning for osseous metastases.   05/15/2021 Initial  Diagnosis   Liver metastases ([place])   05/27/2021 PET scan   1. Widespread recurrent/metastatic disease in this patient who is  status post bilateral mastectomy. Left chest wall recurrence with  left axillary/subpectoral, supraclavicular nodal metastasis.  2. Hepatic, left adrenal, and widespread osseous metastasis.  3. Incidental findings, including: Right nephrolithiasis. Tiny  hiatal hernia.    05/27/2021 Imaging   MRI LUMBAR AND THORACIC SPINE: Thoracic spine:  Osseous metastatic disease involving each level. The most dramatic deposits are at T4 and T6 where there is extraosseous/epidural tumor. No cord compression but there is foraminal impingement on the left at T6-7 and on the right at T3-4. Early dorsal epidural tumor at the level of T10 and T3.   Lumbar spine:  1. Widespread osseous metastatic disease with largest deposit  replacing the L2 body where there is a compression fracture with  mild height loss. Also at this level is early epidural tumor  extension likely affecting both L2-3 foramina.  2. 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    05/27/2021 Imaging   MRI HEAD WITH AND WITHOUT CONTRAST: Negative for  metastatic disease to the brain or calvarium.   06/03/2021 - 06/24/2021 Chemotherapy   Patient is on Treatment Plan : BREAST Trastuzumab q21d X 11 Cycles     06/25/2021 - 01/01/2022 Chemotherapy   Patient is on Treatment Plan : BREAST Docetaxel + Trastuzumab + Pertuzumab (THP) q21d     06/25/2021 - 01/02/2022 Chemotherapy   Patient is on Treatment Plan : BREAST Docetaxel + Trastuzumab + Pertuzumab (THP) q21d     09/14/2021 Imaging   CT chest/abdomen/pelvis  IMPRESSION:  1. Interval resolution of previously seen bulky left axillary and  subpectoral lymphadenopathy. No persistently enlarged lymph nodes.  2. Multiple liver lesions are significantly diminished in size.  3. Widespread osseous metastatic disease, with an interval increase  in sclerosis of several previously lytic lesions.  4. Constellation of findings is consistent with treatment response  of metastatic disease  5. New, although age indeterminate pathologic wedge deformity of the  T6 vertebral body as well as an increased pathologic wedge deformity  of the L2 vertebral body.  6. Coronary artery disease.  Aortic Atherosclerosis (ICD10-I70.0).    01/26/2022 -  Chemotherapy   Patient is on Treatment Plan : BREAST METASTATIC Fam-Trastuzumab Deruxtecan-nxki (Enhertu) (5.4) q21d     Malignant neoplasm metastatic to bone ([place])  04/17/2021 Imaging   CT ABDOMEN AND PELVIS WITH CONTRAST: -New lytic lesion involving the L2 vertebral body (6:72, 2:26) with minimal cortical breakthrough superiorly, but with preservation of vertebral body height, concerning for metastatic disease.  -There are several indeterminate hepatic lesions as described above. In addition to the lesions described above, there is an additional subtle 1.5 cm hypodensity in the hepatic dome (2:7). Given clinical history and presence of a new L2 lesion, leading differential consideration is metastatic disease. Recommend further evaluation with MRI of the abdomen with and  without contrast.   05/12/2021 Imaging   MRI ABDOMEN WITH AND WITHOUT CONTRAST: Numerous heterogeneously enhancing, internally necrotic lesions in the right hepatic lobe, highly concerning for metastatic disease. Largest lesion measures up to 6.7 cm in hepatic segment VI.   Enhancing osseous lesions in the L2 vertebral body, both iliac bones, and in the sacrum, highly concerning for osseous metastases.   05/15/2021 Initial Diagnosis   Bone metastases ([place])   05/27/2021 PET scan   1. Widespread recurrent/metastatic disease in this patient who is  status post bilateral mastectomy.  Left chest wall recurrence with  left axillary/subpectoral, supraclavicular nodal metastasis.  2. Hepatic, left adrenal, and widespread osseous metastasis.  3. Incidental findings, including: Right nephrolithiasis. Tiny  hiatal hernia.    05/27/2021 Imaging   MRI LUMBAR AND THORACIC SPINE: Thoracic spine:  Osseous metastatic disease involving each level. The most dramatic deposits are at T4 and T6 where there is extraosseous/epidural tumor. No cord compression but there is foraminal impingement on the left at T6-7 and on the right at T3-4. Early dorsal epidural tumor at the level of T10 and T3.   Lumbar spine:  1. Widespread osseous metastatic disease with largest deposit  replacing the L2 body where there is a compression fracture with  mild height loss. Also at this level is early epidural tumor  extension likely affecting both L2-3 foramina.  2. 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    05/27/2021 Imaging   MRI HEAD WITH AND WITHOUT CONTRAST: Negative for metastatic disease to the brain or calvarium.   06/03/2021 - 06/24/2021 Chemotherapy   Patient is on Treatment Plan : BREAST Trastuzumab q21d X 11 Cycles     06/25/2021 - 01/01/2022 Chemotherapy   Patient is on Treatment Plan : BREAST Docetaxel + Trastuzumab + Pertuzumab (THP) q21d     06/25/2021 - 01/02/2022 Chemotherapy    Patient is on Treatment Plan : BREAST Docetaxel + Trastuzumab + Pertuzumab (THP) q21d     09/14/2021 Imaging   CT chest/abdomen/pelvis  IMPRESSION:  1. Interval resolution of previously seen bulky left axillary and  subpectoral lymphadenopathy. No persistently enlarged lymph nodes.  2. Multiple liver lesions are significantly diminished in size.  3. Widespread osseous metastatic disease, with an interval increase  in sclerosis of several previously lytic lesions.  4. Constellation of findings is consistent with treatment response  of metastatic disease  5. New, although age indeterminate pathologic wedge deformity of the  T6 vertebral body as well as an increased pathologic wedge deformity  of the L2 vertebral body.  6. Coronary artery disease.  Aortic Atherosclerosis (ICD10-I70.0).    01/26/2022 -  Chemotherapy   Patient is on Treatment Plan : BREAST METASTATIC Fam-Trastuzumab Deruxtecan-nxki (Enhertu) (5.4) q21d         REVIEW OF SYSTEMS:   Constitutional: Denies fevers, chills or abnormal weight loss Eyes: Denies blurriness of vision, she complains of excessive tearing Ears, nose, mouth, throat, and face: Denies mucositis or sore throat However, everything tastes bad to her Respiratory: Denies cough, dyspnea or wheezes Cardiovascular: Denies palpitation, chest discomfort or lower extremity swelling Gastrointestinal:  Denies nausea, heartburn or change in bowel habits Skin: Denies abnormal skin rashes Lymphatics: Denies new lymphadenopathy or easy bruising Neurological:She has severe paresthesias of the left upper extremity with inability to use her left hand Behavioral/Psych: Mood is stable, no new changes  Extremities: She has severe lymphedema of the left upper extremity All other systems were reviewed with the patient and are negative.   VITALS:  Blood pressure (!) 174/72, pulse 86, temperature 97.7 F (36.5 C), temperature source Oral, resp. rate 17, height 5' 0.25"  (1.53 m), weight 111 lb 1.6 oz (50.4 kg), SpO2 95 %.  Wt Readings from Last 3 Encounters:  03/05/22 107 lb 11.2 oz (48.9 kg)  02/16/22 111 lb 8 oz (50.6 kg)  02/12/22 111 lb 1.6 oz (50.4 kg)    Body mass index is 21.52 kg/m.  Performance status (ECOG): 1 - Symptomatic but completely ambulatory  PHYSICAL EXAM:   GENERAL:alert, no distress  and comfortable SKIN: skin color, texture, turgor are normal, no rashes or significant lesions EYES: normal, Conjunctiva are pink and non-injected, sclera clear OROPHARYNX:no exudate, no erythema and lips, buccal mucosa, and tongue normal  NECK: supple, thyroid normal size, non-tender, without nodularity LYMPH:  no palpable lymphadenopathy in the cervical, axillary or inguinal LUNGS: clear to auscultation and percussion with normal breathing effort CHEST: Left mastectomy is negative but she has a 2 cm nodule adjacent to the left shoulder, which is softer and slightly smaller. The nodularity of the left upper chest wall is improved HEART: regular rate & rhythm and no murmurs and no lower extremity edema ABDOMEN:abdomen soft, non-tender and normal bowel sounds Musculoskeletal:no cyanosis of digits and no clubbing  Extremities: She has severe lymphedema of the left upper extremity NEURO: alert & oriented x 3 with fluent speech, she has weakness of the left hand  LABORATORY DATA:  I have reviewed the data as listed    Component Value Date/Time   NA 139 03/05/2022 1052   NA 134 (A) 02/12/2022 0000   NA 137 11/13/2012 1057   K 4.0 03/05/2022 1052   K 4.5 11/13/2012 1057   CL 108 03/05/2022 1052   CO2 26 03/05/2022 1052   CO2 26 11/13/2012 1057   GLUCOSE 85 03/05/2022 1052   GLUCOSE 90 11/13/2012 1057   BUN 12 03/05/2022 1052   BUN 17 02/12/2022 0000   BUN 18.3 11/13/2012 1057   CREATININE 0.49 03/05/2022 1052   CREATININE 0.8 11/13/2012 1057   CALCIUM 8.9 03/05/2022 1052   CALCIUM 9.7 11/13/2012 1057   PROT 6.3 (L) 03/05/2022 1052   PROT  7.4 11/13/2012 1057   ALBUMIN 3.5 03/05/2022 1052   ALBUMIN 3.9 11/13/2012 1057   AST 29 03/05/2022 1052   AST 22 11/13/2012 1057   ALT 17 03/05/2022 1052   ALT 15 11/13/2012 1057   ALKPHOS 48 03/05/2022 1052   ALKPHOS 77 11/13/2012 1057   BILITOT 0.5 03/05/2022 1052   BILITOT 0.54 11/13/2012 1057   GFRNONAA >60 03/05/2022 1052   GFRAA >60 04/25/2019 1045    No results found for: "SPEP", "UPEP"  Lab Results  Component Value Date   WBC 4.0 03/05/2022   NEUTROABS 2.0 03/05/2022   HGB 12.9 03/05/2022   HCT 39.1 03/05/2022   MCV 101.6 (H) 03/05/2022   PLT 170 03/05/2022      Chemistry      Component Value Date/Time   NA 139 03/05/2022 1052   NA 134 (A) 02/12/2022 0000   NA 137 11/13/2012 1057   K 4.0 03/05/2022 1052   K 4.5 11/13/2012 1057   CL 108 03/05/2022 1052   CO2 26 03/05/2022 1052   CO2 26 11/13/2012 1057   BUN 12 03/05/2022 1052   BUN 17 02/12/2022 0000   BUN 18.3 11/13/2012 1057   CREATININE 0.49 03/05/2022 1052   CREATININE 0.8 11/13/2012 1057   GLU 106 02/12/2022 0000      Component Value Date/Time   CALCIUM 8.9 03/05/2022 1052   CALCIUM 9.7 11/13/2012 1057   ALKPHOS 48 03/05/2022 1052   ALKPHOS 77 11/13/2012 1057   AST 29 03/05/2022 1052   AST 22 11/13/2012 1057   ALT 17 03/05/2022 1052   ALT 15 11/13/2012 1057   BILITOT 0.5 03/05/2022 1052   BILITOT 0.54 11/13/2012 1057       RADIOGRAPHIC STUDIES: No results found.

## 2022-02-13 ENCOUNTER — Other Ambulatory Visit: Payer: Self-pay

## 2022-02-15 MED FILL — Dexamethasone Sodium Phosphate Inj 100 MG/10ML: INTRAMUSCULAR | Qty: 1 | Status: AC

## 2022-02-15 MED FILL — Fam-Trastuzumab Deruxtecan-nxki For IV Soln 100 MG: INTRAVENOUS | Qty: 10 | Status: AC

## 2022-02-15 MED FILL — Zoledronic Acid Inj Conc For IV Infusion 4 MG/5ML: INTRAVENOUS | Qty: 3.75 | Status: AC

## 2022-02-16 ENCOUNTER — Inpatient Hospital Stay: Payer: Medicare Other

## 2022-02-16 VITALS — BP 176/74 | HR 87 | Temp 97.5°F | Resp 16 | Ht 60.25 in | Wt 111.5 lb

## 2022-02-16 DIAGNOSIS — Z171 Estrogen receptor negative status [ER-]: Secondary | ICD-10-CM | POA: Diagnosis not present

## 2022-02-16 DIAGNOSIS — Z79899 Other long term (current) drug therapy: Secondary | ICD-10-CM | POA: Diagnosis not present

## 2022-02-16 DIAGNOSIS — Z85038 Personal history of other malignant neoplasm of large intestine: Secondary | ICD-10-CM | POA: Diagnosis not present

## 2022-02-16 DIAGNOSIS — Z5112 Encounter for antineoplastic immunotherapy: Secondary | ICD-10-CM | POA: Diagnosis present

## 2022-02-16 DIAGNOSIS — Z9012 Acquired absence of left breast and nipple: Secondary | ICD-10-CM | POA: Diagnosis not present

## 2022-02-16 DIAGNOSIS — Z853 Personal history of malignant neoplasm of breast: Secondary | ICD-10-CM | POA: Diagnosis not present

## 2022-02-16 DIAGNOSIS — C7951 Secondary malignant neoplasm of bone: Secondary | ICD-10-CM

## 2022-02-16 DIAGNOSIS — I890 Lymphedema, not elsewhere classified: Secondary | ICD-10-CM | POA: Diagnosis not present

## 2022-02-16 DIAGNOSIS — C787 Secondary malignant neoplasm of liver and intrahepatic bile duct: Secondary | ICD-10-CM | POA: Diagnosis not present

## 2022-02-16 DIAGNOSIS — C50412 Malignant neoplasm of upper-outer quadrant of left female breast: Secondary | ICD-10-CM | POA: Diagnosis present

## 2022-02-16 DIAGNOSIS — Z9221 Personal history of antineoplastic chemotherapy: Secondary | ICD-10-CM | POA: Diagnosis not present

## 2022-02-16 DIAGNOSIS — I10 Essential (primary) hypertension: Secondary | ICD-10-CM | POA: Diagnosis not present

## 2022-02-16 MED ORDER — HEPARIN SOD (PORK) LOCK FLUSH 100 UNIT/ML IV SOLN
500.0000 [IU] | Freq: Once | INTRAVENOUS | Status: AC | PRN
  Administered 2022-02-16: 500 [IU]

## 2022-02-16 MED ORDER — ZOLEDRONIC ACID 4 MG/5ML IV CONC
3.0000 mg | Freq: Once | INTRAVENOUS | Status: AC
  Administered 2022-02-16: 3 mg via INTRAVENOUS
  Filled 2022-02-16: qty 3.75

## 2022-02-16 MED ORDER — FAM-TRASTUZUMAB DERUXTECAN-NXKI CHEMO 100 MG IV SOLR
3.9500 mg/kg | Freq: Once | INTRAVENOUS | Status: AC
  Administered 2022-02-16: 200 mg via INTRAVENOUS
  Filled 2022-02-16: qty 10

## 2022-02-16 MED ORDER — DIPHENHYDRAMINE HCL 25 MG PO CAPS
50.0000 mg | ORAL_CAPSULE | Freq: Once | ORAL | Status: AC
  Administered 2022-02-16: 50 mg via ORAL
  Filled 2022-02-16: qty 2

## 2022-02-16 MED ORDER — SODIUM CHLORIDE 0.9 % IV SOLN
10.0000 mg | Freq: Once | INTRAVENOUS | Status: AC
  Administered 2022-02-16: 10 mg via INTRAVENOUS
  Filled 2022-02-16: qty 10

## 2022-02-16 MED ORDER — PALONOSETRON HCL INJECTION 0.25 MG/5ML
0.2500 mg | Freq: Once | INTRAVENOUS | Status: AC
  Administered 2022-02-16: 0.25 mg via INTRAVENOUS
  Filled 2022-02-16: qty 5

## 2022-02-16 MED ORDER — ACETAMINOPHEN 325 MG PO TABS
650.0000 mg | ORAL_TABLET | Freq: Once | ORAL | Status: AC
  Administered 2022-02-16: 650 mg via ORAL
  Filled 2022-02-16: qty 2

## 2022-02-16 MED ORDER — SODIUM CHLORIDE 0.9% FLUSH
10.0000 mL | INTRAVENOUS | Status: DC | PRN
  Administered 2022-02-16: 10 mL

## 2022-02-16 MED ORDER — DEXTROSE 5 % IV SOLN: Freq: Once | INTRAVENOUS | Status: AC

## 2022-02-16 MED ORDER — SODIUM CHLORIDE 0.9 % IV SOLN: Freq: Once | INTRAVENOUS | Status: DC

## 2022-02-16 NOTE — Patient Instructions (Signed)
Fam-Trastuzumab Deruxtecan Injection What is this medication? FAM-TRASTUZUMAB DERUXTECAN (fam-tras TOOZ eu mab DER ux TEE kan) treats some types of cancer. It works by blocking a protein that causes cancer cells to grow and multiply. This helps to slow or stop the spread of cancer cells. This medicine may be used for other purposes; ask your health care provider or pharmacist if you have questions. COMMON BRAND NAME(S): ENHERTU What should I tell my care team before I take this medication? They need to know if you have any of these conditions: Heart disease Heart failure Infection, especially a viral infection, such as chickenpox, cold sores, or herpes Liver disease Lung or breathing disease, such as asthma or COPD An unusual or allergic reaction to fam-trastuzumab deruxtecan, other medications, foods, dyes, or preservatives Pregnant or trying to get pregnant Breast-feeding How should I use this medication? This medication is injected into a vein. It is given by your care team in a hospital or clinic setting. A special MedGuide will be given to you before each treatment. Be sure to read this information carefully each time. Talk to your care team about the use of this medication in children. Special care may be needed. Overdosage: If you think you have taken too much of this medicine contact a poison control center or emergency room at once. NOTE: This medicine is only for you. Do not share this medicine with others. What if I miss a dose? It is important not to miss your dose. Call your care team if you are unable to keep an appointment. What may interact with this medication? Interactions are not expected. This list may not describe all possible interactions. Give your health care provider a list of all the medicines, herbs, non-prescription drugs, or dietary supplements you use. Also tell them if you smoke, drink alcohol, or use illegal drugs. Some items may interact with your  medicine. What should I watch for while using this medication? Visit your care team for regular checks on your progress. Tell your care team if your symptoms do not start to get better or if they get worse. Your condition will be monitored carefully while you are receiving this medication. Do not become pregnant while taking this medication or for 7 months after stopping it. Women should inform their care team if they wish to become pregnant or think they might be pregnant. Men should not father a child while taking this medication and for 4 months after stopping it. There is potential for serious side effects to an unborn child. Talk to your care team for more information. Do not breast-feed an infant while taking this medication or for 7 months after the last dose. This medication has caused decreased sperm counts in some men. This may make it more difficult to father a child. Talk to your care team if you are concerned about your fertility. This medication may increase your risk to bruise or bleed. Call your care team if you notice any unusual bleeding. Be careful brushing or flossing your teeth or using a toothpick because you may get an infection or bleed more easily. If you have any dental work done, tell your dentist you are receiving this medication. This medication may cause dry eyes and blurred vision. If you wear contact lenses, you may feel some discomfort. Lubricating eye drops may help. See your care team if the problem does not go away or is severe. This medication may increase your risk of getting an infection. Call your care team for  advice if you get a fever, chills, sore throat, or other symptoms of a cold or flu. Do not treat yourself. Try to avoid being around people who are sick. Avoid taking medications that contain aspirin, acetaminophen, ibuprofen, naproxen, or ketoprofen unless instructed by your care team. These medications may hide a fever. What side effects may I notice from  receiving this medication? Side effects that you should report to your care team as soon as possible: Allergic reactions--skin rash, itching, hives, swelling of the face, lips, tongue, or throat Dry cough, shortness of breath or trouble breathing Infection--fever, chills, cough, sore throat, wounds that don't heal, pain or trouble when passing urine, general feeling of discomfort or being unwell Heart failure--shortness of breath, swelling of the ankles, feet, or hands, sudden weight gain, unusual weakness or fatigue Unusual bruising or bleeding Side effects that usually do not require medical attention (report these to your care team if they continue or are bothersome): Constipation Diarrhea Hair loss Muscle pain Nausea Vomiting This list may not describe all possible side effects. Call your doctor for medical advice about side effects. You may report side effects to FDA at 1-800-FDA-1088. Where should I keep my medication? This medication is given in a hospital or clinic. It will not be stored at home. NOTE: This sheet is a summary. It may not cover all possible information. If you have questions about this medicine, talk to your doctor, pharmacist, or health care provider.  2023 [employer] Standard (2021-01-07 00:00:00) Zoledronic Acid Injection (Cancer) What is this medication? ZOLEDRONIC ACID (ZOE le dron ik AS id) treats high calcium levels in the blood caused by cancer. It may also be used with chemotherapy to treat weakened bones caused by cancer. It works by slowing down the release of calcium from bones. This lowers calcium levels in your blood. It also makes your bones stronger and less likely to break (fracture). It belongs to a group of medications called bisphosphonates. This medicine may be used for other purposes; ask your health care provider or pharmacist if you have questions. COMMON BRAND NAME(S): Zometa, Zometa Powder What should I tell my care team before I take this  medication? They need to know if you have any of these conditions: Dehydration Dental disease Kidney disease Liver disease Low levels of calcium in the blood Lung or breathing disease, such as asthma Receiving steroids, such as dexamethasone or prednisone An unusual or allergic reaction to zoledronic acid, other medications, foods, dyes, or preservatives Pregnant or trying to get pregnant Breast-feeding How should I use this medication? This medication is injected into a vein. It is given by your care team in a hospital or clinic setting. Talk to your care team about the use of this medication in children. Special care may be needed. Overdosage: If you think you have taken too much of this medicine contact a poison control center or emergency room at once. NOTE: This medicine is only for you. Do not share this medicine with others. What if I miss a dose? Keep appointments for follow-up doses. It is important not to miss your dose. Call your care team if you are unable to keep an appointment. What may interact with this medication? Certain antibiotics given by injection Diuretics, such as bumetanide, furosemide NSAIDs, medications for pain and inflammation, such as ibuprofen or naproxen Teriparatide Thalidomide This list may not describe all possible interactions. Give your health care provider a list of all the medicines, herbs, non-prescription drugs, or dietary supplements you use.  Also tell them if you smoke, drink alcohol, or use illegal drugs. Some items may interact with your medicine. What should I watch for while using this medication? Visit your care team for regular checks on your progress. It may be some time before you see the benefit from this medication. Some people who take this medication have severe bone, joint, or muscle pain. This medication may also increase your risk for jaw problems or a broken thigh bone. Tell your care team right away if you have severe pain in  your jaw, bones, joints, or muscles. Tell you care team if you have any pain that does not go away or that gets worse. Tell your dentist and dental surgeon that you are taking this medication. You should not have major dental surgery while on this medication. See your dentist to have a dental exam and fix any dental problems before starting this medication. Take good care of your teeth while on this medication. Make sure you see your dentist for regular follow-up appointments. You should make sure you get enough calcium and vitamin D while you are taking this medication. Discuss the foods you eat and the vitamins you take with your care team. Check with your care team if you have severe diarrhea, nausea, and vomiting, or if you sweat a lot. The loss of too much body fluid may make it dangerous for you to take this medication. You may need bloodwork while taking this medication. Talk to your care team if you wish to become pregnant or think you might be pregnant. This medication can cause serious birth defects. What side effects may I notice from receiving this medication? Side effects that you should report to your care team as soon as possible: Allergic reactions--skin rash, itching, hives, swelling of the face, lips, tongue, or throat Kidney injury--decrease in the amount of urine, swelling of the ankles, hands, or feet Low calcium level--muscle pain or cramps, confusion, tingling, or numbness in the hands or feet Osteonecrosis of the jaw--pain, swelling, or redness in the mouth, numbness of the jaw, poor healing after dental work, unusual discharge from the mouth, visible bones in the mouth Severe bone, joint, or muscle pain Side effects that usually do not require medical attention (report to your care team if they continue or are bothersome): Constipation Fatigue Fever Loss of appetite Nausea Stomach pain This list may not describe all possible side effects. Call your doctor for medical  advice about side effects. You may report side effects to FDA at 1-800-FDA-1088. Where should I keep my medication? This medication is given in a hospital or clinic. It will not be stored at home. NOTE: This sheet is a summary. It may not cover all possible information. If you have questions about this medicine, talk to your doctor, pharmacist, or health care provider.  2023 [employer] Standard (2021-06-11 00:00:00)

## 2022-02-18 ENCOUNTER — Ambulatory Visit: Payer: Medicare Other | Admitting: Occupational Therapy

## 2022-02-18 DIAGNOSIS — M25642 Stiffness of left hand, not elsewhere classified: Secondary | ICD-10-CM

## 2022-02-18 DIAGNOSIS — M6281 Muscle weakness (generalized): Secondary | ICD-10-CM

## 2022-02-18 DIAGNOSIS — I972 Postmastectomy lymphedema syndrome: Secondary | ICD-10-CM | POA: Diagnosis not present

## 2022-02-18 NOTE — Therapy (Signed)
[facility] [address], [place], [address] Phone: [phone]   Fax:  [phone]  Occupational Therapy Treatment  Patient Details  Name: Jasmin Lewis MRN: [id number] Date of Birth: [birth year] Referring Provider (OT): Elliot Cousin   Encounter Date: 02/18/2022   OT End of Session - 02/18/22 1548     Visit Number 4    Number of Visits 12    Date for OT Re-Evaluation 04/22/22    OT Start Time 8270    OT Stop Time 1424    OT Time Calculation (min) 69 min    Activity Tolerance Patient tolerated treatment well    Behavior During Therapy [facility] for tasks assessed/performed             Past Medical History:  Diagnosis Date   Arthritis    shoulder   Breast cancer ([place])    Cancer ([place])    left breast ca   Colon cancer ([place])    [name], stage II 11/12/2011   Sigmoid lesion 5.9 cm  40 nodes negative but focus of cancer in a diverticum   Pre-op CEA 5.2 with lab normal up to 2.5 resected 09/21/05   Xeloda adjuvant chemotherapy   Hypertension     Past Surgical History:  Procedure Laterality Date   COLONOSCOPY     HEMICOLECTOMY  2007   MASTECTOMY WITH AXILLARY LYMPH NODE DISSECTION Left 03/14/2019   Procedure: LEFT MASTECTOMY WITH TARGETED LYMPH NODE DISSECTION;  Surgeon: Jovita Kussmaul, MD;  Location: [facility];  Service: General;  Laterality: Left;   SENTINEL NODE BIOPSY Left 03/14/2019   Procedure: Left Axillary Sentinel Lymph  Node Biopsy;  Surgeon: Jovita Kussmaul, MD;  Location: [facility];  Service: General;  Laterality: Left;   TONSILLECTOMY     WISDOM TOOTH EXTRACTION      There were no vitals filed for this visit.   Subjective Assessment - 02/18/22 1547     Subjective  This is just so overwhelming.  Cancer treatment.  Situation at home and just felt tired.  I just want to be able to use my arm.  My son has to help me with the bandages.  I just want to know if I will be able to use my hand.   They did change my chemo drug and she felt it was working.    Pertinent History Malignant neoplasm of upper-outer quadrant of left breast in female, estrogen receptor negative ([place])  Stage IIB (T2c N1 M0) HER2 receptor positive left breast cancer, diagnosed in June 2020. This was ER/PR negative. This was treated with neoadjuvant Kadcyla/Perjeta and left mastectomy, with a complete response in breast and nodes. She had only received two doses of adjuvant Herceptin before being discontinued in December 2020.     History of colon cancer, stage II  History of stage II colon cancer, diagnosed in May 2007. This was treated with surgical resection and adjuvant chemotherapy Malignant neoplasm metastatic to liver [facility])  Liver metastases discovered 04/17/21.  Numerous heterogeneously enhancing, internally necrotic lesions in the right hepatic lobe, consistent with metastatic disease. Largest lesion measures up to 6.7 cm and there are at least 6 lesions. We now have biopsy proven HER2 positive recurrent breast cancer with ER/PR negative. She is receiving THP.CT scan now shows excellent response with significant decrease in the size of her liver lesions.     Malignant neoplasm metastatic to bone [facility])  Bone metastases discovered 04/17/21. Enhancing  osseous lesions are present in the L2 vertebral body, both iliac bones, and in the sacrum, highly concerning for osseous metastases. She received her 1st dose of zoledronic acid on January 16th.  I once again today explained to her and her son the reasoning for this medicine.  Her widespread bone metastases show an interval increase in sclerosis of previously lytic lesions consistent with treatment response.     Chest wall recurrence of breast cancer, left ([name])  Left supraclavicular lymph node measuring 2-3 cm. Also involvement of subpectoral node and chest wall skin. This was rapidly progressive despite receiving  trastuzumab.  We have added in Perjeta and Taxotere to her current  regimen and this has been very effective.  The CT scan also shows good response of her adenopathy with resolution of the previous bulky left axillary and subpectoral adenopathy. Today, I do not appreciate axillary nodes. However, she does have a 2 cm nodule noted to the left chest that is new. This is located close to the left shoulder. Of note, she does have existing lymphedema to the left arm; however, this is worse today, most likely from a bee sting she suffered to the left hand. Her arm appears red and is warm to touch. We will treat this with antibiotics.  Refer to lymphedema clinic.    Patient Stated Goals I just want to see if I can get the swelling better in my left arm and hand, in the motion, strength and use of my left dominant hand.    Currently in Pain? Yes   Some pain with passive range of motion of digits but no number provided               [facility] OT Assessment - 02/18/22 0001       Left Hand AROM   L Index  MCP 0-90 60 Degrees    L Long  MCP 0-90 60 Degrees    L Long PIP 0-100 60 Degrees    L Ring  MCP 0-90 60 Degrees    L Ring PIP 0-100 60 Degrees    L Little  MCP 0-90 60 Degrees             LYMPHEDEMA/ONCOLOGY QUESTIONNAIRE - 02/18/22 0001       Left Upper Extremity Lymphedema   15 cm Proximal to Olecranon Process 26 cm    10 cm Proximal to Olecranon Process 27 cm    Olecranon Process 22.5 cm    15 cm Proximal to Ulnar Styloid Process 20.5 cm    10 cm Proximal to Ulnar Styloid Process 18 cm    Just Proximal to Ulnar Styloid Process 15.5 cm    Across Hand at [employer] 18 cm                Pt arrive with large Isotoner glove on as well as Tubigrip D from hand to elbow and Tubigrip E from mid forearm to upper arm.   Measurements taken for left upper extremity circumference compared to last week.  Patient with great decongestion and hand to elbow. -Allowing OT to be able to focus more on range of motion and contrast in session.  Then extended  education again with patient and son about anatomy as well as symptoms signs and treatment and plan of care of lymphedema in left upper extremity. Patient also very overwhelmed because of cancer treatment, home situation with husband, being tired, longstanding of lymphedema with weakness in the left hand.  But  was able to show patient that if she can maintain the progress that she made this past week with keeping her lymphedema under control during the day and night and doing her range of motion exercises 2-3 times a day that OT can focus on rate gaining possibly the range of motion use of left hand.     Fitted with pt in med isotoner glove but light compression around digits -  Cont again with daytime compression to isotoner glove and Tubigrip D from hand to elbow , and Tubigrip E from mid forearm to axilla  Done in session contrast with L left hand and wrist elevated prior to some soft tissue massage as well as   reviewed again  HEP for AAROM and PROM o digits MC flexion, PIP /intrinsic fist , wrist flexion, ext, RD< UD , sup /pro ; elbow flexion, ext and shoulder flexion PROM - Pt can do in supine too Done passive range of motion for composite fist keeping pain under 2/10 Followed by placed on hold where patient was able to initiate PIP flexion of third through fifth more than second. At the end patient was able to grasp and release large yellow foam block with third and fourth 5 times. Provided patient with a foam block to be able to [facility] release after her home exercises sessions when doing fisting exercises. Reviewed with patient again doing 3 times during day  HEP - 11am , 4 pm and 8 pm   Pt feels like she can be more compliant with times   Patient and son ed on night time - to do  Apply Eucerin lotion and Isotoner glove. Soft stockinette on left upper extremity prior  - fitted with Rosidal foam from hand to axilla over lap 50%  Followed by  6 cm short stretch bandage starting at the  wrist, through the webspace over the hand  3 x and figure eights up to proximal to elbow     8 cm short stretch bandage anchored around the wrist through the webspace  1 x - over the hand and circular overlap 50% up to axilla.  Done my OT and pt to keep on until tomorrow am                OT Education - 02/18/22 1548     Education Details progress and changes to HEP    Person(s) Educated Patient;Child(ren)    Methods Explanation;Demonstration;Tactile cues;Verbal cues;Handout    Comprehension Verbal cues required;Returned demonstration;Verbalized understanding                 OT Long Term Goals - 01/28/22 2117       OT LONG TERM GOAL #1   Title Patient and son demo understanding to do home program to decrease circumference in left upper extremity to get measured for appropriate compression garment for home use.    Baseline No knowledge of bandaging and safely donning compression garment because of weakness in digits and wrist extension.  As well as flexion.    Time 4    Period Weeks    Status New    Target Date 02/25/22      OT LONG TERM GOAL #2   Title Left upper extremity circumference decreased by 2 cm in forearm and upper arm and 1 cm in hand and forearm to be fitted for appropriate compression garment to use safely at home to prevent infection.    Baseline No knowledge of home program to maintain circumference and  prevent infection.    Time 6    Period Weeks    Status New    Target Date 03/11/22      OT LONG TERM GOAL #3   Title Patient show increase  L forearm pronation, wrist flexion and digits flexion in left arm to initiate use of left hand and ADLs.    Baseline Patient unable to use left hand and bathing and dressing or any ADLs unable to initiate digit flexion to make a fist or pinch.    Time 8    Period Weeks    Status New    Target Date 03/25/22                   Plan - 02/18/22 1549     Clinical Impression Statement Patient  presented OT evaluation with a diagnosis of left upper extremity lymphedema.  Patient report lymphedema started about December 2022.  Was fitted with a compression sleeve.  But it got worse gradually with hand worsening after being stung by a wasp on the left hand few months after that.. Chest wall recurrence of breast cancer, left ([name])  Left supraclavicular lymph node measuring 2-3 cm. Also involvement of subpectoral node and chest wall skin. This was rapidly progressive despite receiving  trastuzumab.  They added in Perjeta and Taxotere to her current regimen and this has been very effective.  The CT scan also shows good response of her adenopathy with resolution of the previous bulky left axillary and subpectoral adenopathy.  With 2 cm nodule noted to the left chest that is new. This is located close to the left shoulder.  Patient was treated with antibiotics after wasp sting.  Patient is left-hand dominant -since last time changes made for compression at home using Isotoner glove with Tubigrip D and EE during the day.  At nighttime Isotoner glove and son bandaged with 1 Rosidal foam layer and short stretch 6 and 8 cm gradient patient arrived this date with greatly improvement in circumference from hand to elbow.  This OT was able to focus more on passive range of motion as well as active and placed on hold for left digits with great success.  Patient showed increased MC flexion as well as PIP flexion this date.  Patient was able to grasp and release large foam block during session.. Was able to do again place and hold partial fist in session. Pt verbalized numbness and pins-and-needles in all digits and hand.  Appear pt has carpal tunnel symptoms because of increased edema causing stiffness and weakness in left hand.  Questioning reason for weakness in proximal muscles for pronation as well as wrist flexion.  Patient to hold off on wearing her old compression sleeve.  Unable to resist with digit extension to  don sleeve safely.  Reinforced with patient and son again to continue with compressio at home that OT can focus on range of motion to digits.  Patient can benefit from skilled OT services to decrease circumference of left upper extremity, fitting of appropriate compression and possibly increased range of motion/strength to increase functional use of left dominant hand for quality of life performing ADLs.    OT Occupational Profile and History Problem Focused Assessment - Including review of records relating to presenting problem    Occupational performance deficits (Please refer to evaluation for details): ADL's;IADL's;Play;Leisure;Social Participation    Body Structure / Function / Physical Skills ADL;Coordination;Sensation;Flexibility;IADL;ROM;Edema;Dexterity;Strength;Decreased knowledge of precautions    Rehab Potential Fair    Clinical  Decision Making Several treatment options, min-mod task modification necessary    Comorbidities Affecting Occupational Performance: May have comorbidities impacting occupational performance    Modification or Assistance to Complete Evaluation  Min-Moderate modification of tasks or assist with assess necessary to complete eval    OT Frequency 1x / week    OT Duration 12 weeks    OT Treatment/Interventions Self-care/ADL training;Manual lymph drainage;Compression bandaging;Therapeutic exercise;Contrast Bath;Manual Therapy;Patient/family education;Passive range of motion    Consulted and Agree with Plan of Care Patient;Family member/caregiver             Patient will benefit from skilled therapeutic intervention in order to improve the following deficits and impairments:   Body Structure / Function / Physical Skills: ADL, Coordination, Sensation, Flexibility, IADL, ROM, Edema, Dexterity, Strength, Decreased knowledge of precautions       Visit Diagnosis: Postmastectomy lymphedema syndrome  Muscle weakness (generalized)  Stiffness of left hand, not  elsewhere classified    Problem List Patient Active Problem List   Diagnosis Date Noted   Lymphedema of left upper extremity 02/05/2022    Class: Chronic   Secondary malignant neoplasm of bone and bone marrow ([place]) 09/07/2021   Chest wall recurrence of breast cancer, left ([place]) 06/24/2021    Class: Diagnosis of   Dehydration 05/25/2021   Malignant neoplasm metastatic to liver ([place]) 05/15/2021    Class: Diagnosis of   Malignant neoplasm metastatic to bone ([place]) 05/15/2021    Class: Acute   Hypercalcemia 05/15/2021    Class: Acute   Brachial plexus injury, left 04/27/2021    Class: Chronic   Malignant neoplasm of upper-outer quadrant of left breast in female, estrogen receptor negative ([place]) 11/02/2018   Colon polyps 05/19/2018    Class: History of   History of colon cancer, stage II 11/12/2011    Rosalyn Gess, OTR/L,CLT 02/18/2022, 3:55 PM  [facility] [address], [place], [address] Phone: [phone]   Fax:  [phone]  Name: Jasmin Lewis MRN: [id number] Date of Birth: 22-Jul-1931

## 2022-03-01 ENCOUNTER — Ambulatory Visit: Payer: Medicare Other | Admitting: Occupational Therapy

## 2022-03-01 DIAGNOSIS — M6281 Muscle weakness (generalized): Secondary | ICD-10-CM

## 2022-03-01 DIAGNOSIS — I972 Postmastectomy lymphedema syndrome: Secondary | ICD-10-CM | POA: Diagnosis not present

## 2022-03-01 DIAGNOSIS — M25642 Stiffness of left hand, not elsewhere classified: Secondary | ICD-10-CM

## 2022-03-01 NOTE — Therapy (Signed)
[facility] [address], [place], [address] Phone: [phone]   Fax:  [phone]  Occupational Therapy Treatment  Patient Details  Name: Jasmin Lewis MRN: [id number] Date of Birth: 06/15/1931 Referring Provider (OT): Elliot Cousin   Encounter Date: 03/01/2022   OT End of Session - 03/01/22 1825     Visit Number 5    Number of Visits 12    Date for OT Re-Evaluation 04/22/22    OT Start Time 1603    OT Stop Time 1656    OT Time Calculation (min) 53 min    Activity Tolerance Patient tolerated treatment well    Behavior During Therapy [facility] for tasks assessed/performed             Past Medical History:  Diagnosis Date   Arthritis    shoulder   Breast cancer ([place])    Cancer ([place])    left breast ca   Colon cancer ([place])    [name], stage II 11/12/2011   Sigmoid lesion 5.9 cm  40 nodes negative but focus of cancer in a diverticum   Pre-op CEA 5.2 with lab normal up to 2.5 resected 09/21/05   Xeloda adjuvant chemotherapy   Hypertension     Past Surgical History:  Procedure Laterality Date   COLONOSCOPY     HEMICOLECTOMY  2007   MASTECTOMY WITH AXILLARY LYMPH NODE DISSECTION Left 03/14/2019   Procedure: LEFT MASTECTOMY WITH TARGETED LYMPH NODE DISSECTION;  Surgeon: Jovita Kussmaul, MD;  Location: [facility];  Service: General;  Laterality: Left;   SENTINEL NODE BIOPSY Left 03/14/2019   Procedure: Left Axillary Sentinel Lymph  Node Biopsy;  Surgeon: Jovita Kussmaul, MD;  Location: [facility];  Service: General;  Laterality: Left;   TONSILLECTOMY     WISDOM TOOTH EXTRACTION      There were no vitals filed for this visit.   Subjective Assessment - 03/01/22 1823     Subjective  The swelling looks really much better.  Bandaging at nighttime and then during the day for compression there was 2 days that we got some of the motion in my fingers when you get really tight and stiff.  Can you look for me at  that Velcro system for compression garment please    Pertinent History Malignant neoplasm of upper-outer quadrant of left breast in female, estrogen receptor negative ([place])  Stage IIB (T2c N1 M0) HER2 receptor positive left breast cancer, diagnosed in June 2020. This was ER/PR negative. This was treated with neoadjuvant Kadcyla/Perjeta and left mastectomy, with a complete response in breast and nodes. She had only received two doses of adjuvant Herceptin before being discontinued in December 2020.     History of colon cancer, stage II  History of stage II colon cancer, diagnosed in May 2007. This was treated with surgical resection and adjuvant chemotherapy Malignant neoplasm metastatic to liver [facility])  Liver metastases discovered 04/17/21.  Numerous heterogeneously enhancing, internally necrotic lesions in the right hepatic lobe, consistent with metastatic disease. Largest lesion measures up to 6.7 cm and there are at least 6 lesions. We now have biopsy proven HER2 positive recurrent breast cancer with ER/PR negative. She is receiving THP.CT scan now shows excellent response with significant decrease in the size of her liver lesions.     Malignant neoplasm metastatic to bone [facility])  Bone metastases discovered 04/17/21. Enhancing osseous lesions are present in the L2 vertebral body, both iliac bones, and  in the sacrum, highly concerning for osseous metastases. She received her 1st dose of zoledronic acid on January 16th.  I once again today explained to her and her son the reasoning for this medicine.  Her widespread bone metastases show an interval increase in sclerosis of previously lytic lesions consistent with treatment response.     Chest wall recurrence of breast cancer, left ([name])  Left supraclavicular lymph node measuring 2-3 cm. Also involvement of subpectoral node and chest wall skin. This was rapidly progressive despite receiving  trastuzumab.  We have added in Perjeta and Taxotere to her current regimen and  this has been very effective.  The CT scan also shows good response of her adenopathy with resolution of the previous bulky left axillary and subpectoral adenopathy. Today, I do not appreciate axillary nodes. However, she does have a 2 cm nodule noted to the left chest that is new. This is located close to the left shoulder. Of note, she does have existing lymphedema to the left arm; however, this is worse today, most likely from a bee sting she suffered to the left hand. Her arm appears red and is warm to touch. We will treat this with antibiotics.  Refer to lymphedema clinic.    Patient Stated Goals I just want to see if I can get the swelling better in my left arm and hand, in the motion, strength and use of my left dominant hand.    Currently in Pain? Yes   Patient did not do pain scale during passive range of motion of digits               [facility] OT Assessment - 03/01/22 0001       Left Hand AROM   L Index  MCP 0-90 60 Degrees    L Index PIP 0-100 60 Degrees    L Long  MCP 0-90 60 Degrees    L Long PIP 0-100 60 Degrees    L Ring  MCP 0-90 60 Degrees    L Ring PIP 0-100 60 Degrees    L Little  MCP 0-90 60 Degrees             LYMPHEDEMA/ONCOLOGY QUESTIONNAIRE - 03/01/22 0001       Left Upper Extremity Lymphedema   15 cm Proximal to Olecranon Process 25 cm    10 cm Proximal to Olecranon Process 25.5 cm    Olecranon Process 23 cm    15 cm Proximal to Ulnar Styloid Process 21.4 cm    10 cm Proximal to Ulnar Styloid Process 18.5 cm    Just Proximal to Ulnar Styloid Process 16 cm    Across Hand at [employer] 17 cm                Pt arrive with Isotoner glove on as well as Tubigrip D from hand to elbow and Tubigrip E from mid forearm to upper arm.   Measurements taken for left upper extremity circumference compared to last time - with great progress and able to use her compression sleeve- ed pt and son on putting bag over hand to slide sleeve over hand -and try her  compression glove from before -but wash and stretch hand and digits maybe before  Otherwise try istoner glove  Cont to  wear at night time -Soft stockinette on left upper extremity prior  - fitted with Rosidal foam from hand to axilla over lap 50%  Followed by  6 cm short stretch bandage  starting at the wrist, through the webspace over the hand  3 x and figure eights up to proximal to elbow     8 cm short stretch bandage anchored around the wrist through the webspace  1 x - over the hand and circular overlap 50% up to axilla.    Patient with great decongestion of L UE. -Allowing OT to be able to focus more on range of motion and contrast in session.  Then extended education again with patient and son about anatomy as well as symptoms signs and treatment and plan of care of lymphedema in left upper extremity.   Cont to have son and  patient maintain the progress that she made this past 2 weeks with keeping her lymphedema under control during the day and night and doing her range of motion exercises 2-3 times a day that OT can focus on  gaining possibly the range of motion use of left hand.        Cont again with daytime compression to isotoner glove and Tubigrip D from hand to elbow , and Tubigrip E from mid forearm to axilla   Done in session contrast with L left hand and wrist elevated prior to some soft tissue massage as well as   reviewed again  HEP for AAROM and PROM o digits MC flexion, PIP /intrinsic fist  FOCUS ON THIS DATE, wrist flexion, ext, RD< UD , sup /pro ; elbow flexion, ext and shoulder flexion PROM - Pt can do in supine too Done this date light coban flexion wrap for 3 min prior to Place and hold and PROM  Plan to make pt flexion glove to use at home for next time   Also send email to breast cancer navigator to check into pink [employer] fund assistance for patient out of [place] [facility].  Circ Aid compression garment upper extremity or would need to look for other funding.   Await answer.   Done passive range of motion for composite fist keeping pain under 2/10 Followed by placed on hold where patient was able to initiate PIP flexion of second through fourth more than fifth at the end patient was able to grasp and release large yellow foam block 5  Provided patient with a foam block last time to be able to grasp and release after her home exercises sessions when doing fisting exercises. Reviewed with patient again doing 3 times during day  HEP - 11am , 4 pm and 8 pm   Pt feels like she can be more compliant with times                   OT Education - 03/01/22 1824     Education Details progress and changes to HEP    Person(s) Educated Patient;Child(ren)    Methods Explanation;Demonstration;Tactile cues;Verbal cues;Handout    Comprehension Verbal cues required;Returned demonstration;Verbalized understanding                 OT Long Term Goals - 01/28/22 2117       OT LONG TERM GOAL #1   Title Patient and son demo understanding to do home program to decrease circumference in left upper extremity to get measured for appropriate compression garment for home use.    Baseline No knowledge of bandaging and safely donning compression garment because of weakness in digits and wrist extension.  As well as flexion.    Time 4    Period Weeks    Status New  Target Date 02/25/22      OT LONG TERM GOAL #2   Title Left upper extremity circumference decreased by 2 cm in forearm and upper arm and 1 cm in hand and forearm to be fitted for appropriate compression garment to use safely at home to prevent infection.    Baseline No knowledge of home program to maintain circumference and prevent infection.    Time 6    Period Weeks    Status New    Target Date 03/11/22      OT LONG TERM GOAL #3   Title Patient show increase  L forearm pronation, wrist flexion and digits flexion in left arm to initiate use of left hand and ADLs.    Baseline Patient  unable to use left hand and bathing and dressing or any ADLs unable to initiate digit flexion to make a fist or pinch.    Time 8    Period Weeks    Status New    Target Date 03/25/22                   Plan - 03/01/22 1825     Clinical Impression Statement Patient presented OT evaluation with a diagnosis of left upper extremity lymphedema.  Patient report lymphedema started about December 2022.  Was fitted with a compression sleeve.  But it got worse gradually with hand worsening after being stung by a wasp on the left hand few months after that.. Chest wall recurrence of breast cancer, left ([name])  Left supraclavicular lymph node measuring 2-3 cm. Also involvement of subpectoral node and chest wall skin. This was rapidly progressive despite receiving  trastuzumab.  They added in Perjeta and Taxotere to her current regimen and this has been very effective.  The CT scan also shows good response of her adenopathy with resolution of the previous bulky left axillary and subpectoral adenopathy.  With 2 cm nodule noted to the left chest that is new. This is located close to the left shoulder.  Patient was treated with antibiotics after wasp sting.  Patient is left-hand dominant -last 2 visits had pt wear isotoner glove with Tubigrip D and E during the day.  At nighttime Isotoner glove and son bandaged with 1 Rosidal foam layer and short stretch 6 and 8 cm gradient. Pt arrive this date with great progress in lymphedema circumference. Pt and son to try and had pt wear her old daytime compression sleeve during day with isotoner glove. Will look into assistance for pt to get circ aid compression that she can use daytime and night time. OT able to do more contrast and PROM and place and hold for [facility] and PIP flexion this date. Pt to focus on PROM to [facility] but also not to do some PROM for intrinsic fist - prior to place and hold and grasp and release of  large foam block.  Pt verbalized numbness in 3rd thru 5th  digits.  Appear pt has carpal tunnel symptoms or ulnar N compression because of increased edema causing stiffness and weakness in left hand.  Questioning reason for weakness in proximal muscles for pronation as well as wrist flexion.  Reinforced with patient and son again to continue with compressio at home that OT can focus on range of motion to digits.  Patient can benefit from skilled OT services to decrease circumference of left upper extremity, fitting of appropriate compression and possibly increased range of motion/strength to increase functional use of left dominant hand for quality  of life performing ADLs.    OT Occupational Profile and History Problem Focused Assessment - Including review of records relating to presenting problem    Occupational performance deficits (Please refer to evaluation for details): ADL's;IADL's;Play;Leisure;Social Participation    Body Structure / Function / Physical Skills ADL;Coordination;Sensation;Flexibility;IADL;ROM;Edema;Dexterity;Strength;Decreased knowledge of precautions    Rehab Potential Fair    Clinical Decision Making Several treatment options, min-mod task modification necessary    Comorbidities Affecting Occupational Performance: May have comorbidities impacting occupational performance    Modification or Assistance to Complete Evaluation  Min-Moderate modification of tasks or assist with assess necessary to complete eval    OT Frequency 1x / week    OT Duration 12 weeks    OT Treatment/Interventions Self-care/ADL training;Manual lymph drainage;Compression bandaging;Therapeutic exercise;Contrast Bath;Manual Therapy;Patient/family education;Passive range of motion    Consulted and Agree with Plan of Care Patient;Family member/caregiver             Patient will benefit from skilled therapeutic intervention in order to improve the following deficits and impairments:   Body Structure / Function / Physical Skills: ADL, Coordination, Sensation,  Flexibility, IADL, ROM, Edema, Dexterity, Strength, Decreased knowledge of precautions       Visit Diagnosis: Postmastectomy lymphedema syndrome  Muscle weakness (generalized)  Stiffness of left hand, not elsewhere classified    Problem List Patient Active Problem List   Diagnosis Date Noted   Lymphedema of left upper extremity 02/05/2022    Class: Chronic   Secondary malignant neoplasm of bone and bone marrow ([place]) 09/07/2021   Chest wall recurrence of breast cancer, left ([place]) 06/24/2021    Class: Diagnosis of   Dehydration 05/25/2021   Malignant neoplasm metastatic to liver ([place]) 05/15/2021    Class: Diagnosis of   Malignant neoplasm metastatic to bone ([place]) 05/15/2021    Class: Acute   Hypercalcemia 05/15/2021    Class: Acute   Brachial plexus injury, left 04/27/2021    Class: Chronic   Malignant neoplasm of upper-outer quadrant of left breast in female, estrogen receptor negative ([place]) 11/02/2018   Colon polyps 05/19/2018    Class: History of   History of colon cancer, stage II 11/12/2011    Rosalyn Gess, OTR/L,CLT 03/01/2022, 6:32 PM  [facility] [address] [place], [address] Phone: [phone]   Fax:  [phone]  Name: CHASSITY LUDKE MRN: [id number] Date of Birth: [birth year]

## 2022-03-02 ENCOUNTER — Other Ambulatory Visit: Payer: Self-pay

## 2022-03-05 ENCOUNTER — Inpatient Hospital Stay (INDEPENDENT_AMBULATORY_CARE_PROVIDER_SITE_OTHER): Payer: Medicare Other | Admitting: Hematology and Oncology

## 2022-03-05 ENCOUNTER — Encounter: Payer: Self-pay | Admitting: Hematology and Oncology

## 2022-03-05 ENCOUNTER — Inpatient Hospital Stay: Payer: Medicare Other

## 2022-03-05 VITALS — BP 159/78 | HR 72 | Temp 98.3°F | Resp 16 | Ht 60.25 in | Wt 107.7 lb

## 2022-03-05 DIAGNOSIS — C50412 Malignant neoplasm of upper-outer quadrant of left female breast: Secondary | ICD-10-CM

## 2022-03-05 DIAGNOSIS — C787 Secondary malignant neoplasm of liver and intrahepatic bile duct: Secondary | ICD-10-CM | POA: Diagnosis not present

## 2022-03-05 DIAGNOSIS — C7951 Secondary malignant neoplasm of bone: Secondary | ICD-10-CM | POA: Diagnosis not present

## 2022-03-05 DIAGNOSIS — Z5112 Encounter for antineoplastic immunotherapy: Secondary | ICD-10-CM | POA: Diagnosis not present

## 2022-03-05 DIAGNOSIS — Z171 Estrogen receptor negative status [ER-]: Secondary | ICD-10-CM

## 2022-03-05 DIAGNOSIS — Z5189 Encounter for other specified aftercare: Secondary | ICD-10-CM | POA: Diagnosis not present

## 2022-03-05 DIAGNOSIS — Z85038 Personal history of other malignant neoplasm of large intestine: Secondary | ICD-10-CM

## 2022-03-05 DIAGNOSIS — I5189 Other ill-defined heart diseases: Secondary | ICD-10-CM

## 2022-03-05 LAB — CBC WITH DIFFERENTIAL (CANCER CENTER ONLY)
Abs Immature Granulocytes: 0.01 10*3/uL (ref 0.00–0.07)
Basophils Absolute: 0 10*3/uL (ref 0.0–0.1)
Basophils Relative: 1 %
Eosinophils Absolute: 0 10*3/uL (ref 0.0–0.5)
Eosinophils Relative: 1 %
HCT: 39.1 % (ref 36.0–46.0)
Hemoglobin: 12.9 g/dL (ref 12.0–15.0)
Immature Granulocytes: 0 %
Lymphocytes Relative: 38 %
Lymphs Abs: 1.5 10*3/uL (ref 0.7–4.0)
MCH: 33.5 pg (ref 26.0–34.0)
MCHC: 33 g/dL (ref 30.0–36.0)
MCV: 101.6 fL — ABNORMAL HIGH (ref 80.0–100.0)
Monocytes Absolute: 0.4 10*3/uL (ref 0.1–1.0)
Monocytes Relative: 11 %
Neutro Abs: 2 10*3/uL (ref 1.7–7.7)
Neutrophils Relative %: 49 %
Platelet Count: 170 10*3/uL (ref 150–400)
RBC: 3.85 MIL/uL — ABNORMAL LOW (ref 3.87–5.11)
RDW: 15.7 % — ABNORMAL HIGH (ref 11.5–15.5)
WBC Count: 4 10*3/uL (ref 4.0–10.5)
nRBC: 0 % (ref 0.0–0.2)

## 2022-03-05 LAB — CMP (CANCER CENTER ONLY)
ALT: 17 U/L (ref 0–44)
AST: 29 U/L (ref 15–41)
Albumin: 3.5 g/dL (ref 3.5–5.0)
Alkaline Phosphatase: 48 U/L (ref 38–126)
Anion gap: 5 (ref 5–15)
BUN: 12 mg/dL (ref 8–23)
CO2: 26 mmol/L (ref 22–32)
Calcium: 8.9 mg/dL (ref 8.9–10.3)
Chloride: 108 mmol/L (ref 98–111)
Creatinine: 0.49 mg/dL (ref 0.44–1.00)
GFR, Estimated: >60 mL/min (ref >60)
Glucose, Bld: 85 mg/dL (ref 70–99)
Potassium: 4 mmol/L (ref 3.5–5.1)
Sodium: 139 mmol/L (ref 135–145)
Total Bilirubin: 0.5 mg/dL (ref 0.3–1.2)
Total Protein: 6.3 g/dL — ABNORMAL LOW (ref 6.5–8.1)

## 2022-03-05 NOTE — Progress Notes (Signed)
[facility]  [address] [place],  [place]  [address] [phone]  Clinic Day:  03/05/2022  Referring physician: [name], MD  ASSESSMENT & PLAN:   Assessment & Plan: Malignant neoplasm metastatic to liver [facility]) Recurrent HER2 positive breast cancer with liver metastasis.  She had recent progression of disease after initial response to trastuzumab/pertuzumab, so is on Enhertu every 3 weeks.  She is tolerating this without significant difficulty.  She will proceed with a third cycle next week.  We will plan to see her back in 3 weeks with a CBC, comprehensive metabolic panel and echocardiogram prior to a fourth cycle of Enhertu.  History of colon cancer, stage II History of stage II colon cancer diagnosed in May 2007.  Preop CEA was 5.2. She underwent surgical resection and surgical pathology revealed a 5.9 x 3.8 x 1.2 cm moderate to well-differentiated adenocarcinoma penetrating the muscular wall. Forty lymph nodes negative. No vascular or lymphatic invasion. Multiple diverticula with microabscess formation and inflammation. Carcinoma present in at least 1 diverticulum. She received adjuvant chemotherapy with Xeloda for 9 months. Colonoscopy from August 2013 revealed 2 polyps, which were resected and chronic diverticulosis.  Malignant neoplasm metastatic to bone [facility]) Bone metastases diagnosed in December 2022. Enhancing osseous lesions are present in the L2 vertebral body, both iliac bones, and in the sacrum, highly concerning for osseous metastases. She was started on zoledronic acid monthly in January.  CT chest, abdomen and pelvis revealed the widespread bone metastases showed an interval increase in sclerosis compared to previous imaging which is consistent with treatment response.  She is now receiving zoledronic acid every 6 weeks to correspond with her chemotherapy.   The patient and her son understand the plans discussed today and are in  agreement with them.  They know to contact our office if she develops concerns prior to her next appointment.   I provided 20 minutes of face-to-face time during this encounter and > 50% was spent counseling as documented under my assessment and plan.    Marvia Pickles, PA-C  [facility] AT [facility] [address] [place] [address] Dept: [phone] Dept Fax: [phone]   Orders Placed This Encounter  Procedures   ECHOCARDIOGRAM COMPLETE    Standing Status:   Future    Standing Expiration Date:   03/06/2023    Order Specific Question:   Where should this test be performed    Answer:   [facility]    Order Specific Question:   Perflutren DEFINITY (image enhancing agent) should be administered unless hypersensitivity or allergy exist    Answer:   Administer Perflutren    Order Specific Question:   Is a special reader required? (athlete or structural heart)    Answer:   No    Order Specific Question:   Reason for exam-Echo    Answer:   Chemo  [id number]      CHIEF COMPLAINT:  CC: Widespread metastatic HER2 positive breast cancer  Current Treatment: Enhertu every 3 weeks with zoledronic acid every 6 weeks.  HISTORY OF PRESENT ILLNESS:  Jasmin Lewis is a 88 y.o. female who we began seeing in January for recurrent HER2 receptor positive breast cancer with liver and bone metastasis.  She has a history of stage IIB (T2 N1 M0) HER2 positive left breast cancer diagnosed in June 2020.  Mammogram and ultrasound revealed a 2.9 cm irregular mass in the upper outer  left breast with 2 abnormal appearing left axillary lymph nodes.  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  The left axillary lymph node was positive for invasive ductal carcinoma.  Estrogen and progesterone receptors were negative and HER2 receptor positive.  Ki 67 was 70%.  Breast MRI revealed a 3.3 cm mass of the lateral port  position of the left breast positive with 3 enlarged left axillary nodes.  PET scan did not reveal any distant metastasis.  She was treated with neoadjuvant fam-trastuzumab/pertuzumab with resolution of the tumor and axillary lymphadenopathy on MRI.  She underwent left mastectomy and targeted lymph node dissection in November 2020.  Pathology did not reveal any residual disease.  She was then placed on maintenance trastuzumab, but only received 2 doses, as she refused further echocardiogram.  She has chronic lymphedema of the left upper extremity since November 2022 after an accident, for which she wears a compression sleeve.  She presented to [facility] ER in December 2022 due to constipation and back pain.  CT abdomen and pelvis revealed a new lytic lesion of the L2 vertebral body concerning for metastatic disease.  There were several indeterminate hepatic lesions.  MRI abdomen in January revealed numerous heterogeneously enhancing, internally necrotic lesions in the right hepatic lobe highly concerning for metastatic disease with the largest measuring up to 6.7 cm.  PET revealed hypermetabolic left supraclavicular nodes measuring up to 9 mm, left chest wall nodule measuring 1.6 cm, left axillary and subpectoral lymph nodes measuring up to 1.5 cm, extensive bilateral hepatic metastasis with the largest lesion measuring 5.2 x 7.5 cm and widespread osseous metastasis, as well as left adrenal hypermetabolism with concurrent thickening.  MRI head did not reveal any metastatic disease to the brain or skull.  MRI thoracic and lumbar spine revealed widespread osseous metastasis with the largest deposit replacing L2 body where there is a compression fracture with mild height loss and early epidural tumor extension at L2-3. she underwent excision of the left chest wall mass.  Pathology revealed a 5 mm carcinoma, which was HER2 receptor positive and estrogen and progesterone receptor negative.  She was started on single  agent trastuzumab in January.  However after 1 cycle, she had rapidly progressive disease in the skin, so pertuzumab was added to trastuzumab.  She initially had a response to this with resolution of the bulky left and subpectoral axillary adenopathy, as well a significant decrease in the liver lesions and increasing sclerosis of the widespread osseous metastatic disease consistent with treatment response on CT imaging in May.  Repeat CT imaging in July was stable to improved.  She then had recurrence in the left chest wall in September.  She was then switched to Enhertu every 3 weeks.  She has continued zoledronic acid every 6 weeks.    Oncology History Overview Note  Cancer Staging Malignant neoplasm of upper-outer quadrant of left breast in female, estrogen receptor negative ([place]) Staging form: Breast, AJCC 8th Edition - Clinical stage from 10/11/2018: Stage IIB (cT2, cN1, cM0, G3, ER-, PR-, HER2+) - Signed by Truitt Merle, MD on 11/02/2018 - Clinical: No stage assigned - Unsigned    History of colon cancer, stage II  09/2005 Initial Diagnosis   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 Findings were a 5.9 x 3.8 x 1.2 cm moderate to well-differentiated adenocarcinoma penetrating the muscular wall. Forty lymph nodes negative. No vascular  or lymphatic invasion. Multiple diverticula with microabscess formation and inflammation. Carcinoma present in at least 1 diverticulum. Preop CEA 5.2 with lab normal range 0 to 2.5. Preop CT scan with no obvious additional pathology.    2007 -  Chemotherapy   She received oral Xeloda chemotherapy as an adjuvant. She declined treatment on a clinical trial.    11/12/2011 Initial Diagnosis   [name], stage II   12/09/2011 Procedure   Followup colonoscopy done on 12/09/2011. She was found to have 2 polyps which were removed. Chronic diverticulosis.     Malignant  neoplasm of upper-outer quadrant of left breast in female, estrogen receptor negative ([place])  09/26/2018 Mammogram   Mammogram/US of left breast 09/26/18 IMPRESSION:  1. 2.9cm irregular mass in the upper outer left breast corresponds to the palpable abnormality. This is highly suspicious for breast carcinoma.  2. Two adjacent abnormal left axillary  Lymph nodes suspicious for metastatic adenopathy. There is a third borderline abnormal left axillary LN with a cortex thickened to 14m.  3. Benign right breast cyst. No evidence of right breast malignancy.    10/11/2018 Cancer Staging   Staging form: Breast, AJCC 8th Edition - Clinical stage from 10/11/2018: Stage IIB (cT2, cN1, cM0, G3, ER-, PR-, HER2+) - Signed by [name] MD on 11/02/2018   10/11/2018 Initial Biopsy   Diagnosis 10/11/18 1. Breast, left, needle core biopsy, upper outer left 2 o'clock - INVASIVE DUCTAL CARCINOMA. - DUCTAL CARCINOMA IN SITU. - LYMPHOVASCULAR INVASION IS IDENTIFIED. - SEE COMMENT. 2. Lymph node, needle/core biopsy, left axilla - INVASIVE DUCTAL CARCINOMA. - SEE COMMENT.   10/11/2018 Receptors her2   The tumor cells are POSITIVE for Her2 (3+). Estrogen Receptor: 0%, NEGATIVE Progesterone Receptor: 0%, NEGATIVE Proliferation Marker Ki67: 70%   11/02/2018 Initial Diagnosis   Malignant neoplasm of upper-outer quadrant of left breast in female, estrogen receptor negative ([place]   11/15/2018 Breast MRI   MRI breast 11/15/18  IMPRESSION: 1. 3.3 centimeter mass in the LATERAL portion of the LEFT breast consistent with known malignancy. 2. There is significant non mass enhancement surrounding this mass and extending anteriorly into the nipple base, with largest diameter in the anterior to posterior axis, measuring 7.2 centimeters. 3. If the patient would consider breast conservation, additional MR guided core biopsies are recommended. Consider biopsy of the inferior and anterior extent of the non mass enhancement to  document extent of disease. 4. Three enlarged LEFT axillary lymph nodes. 5. RIGHT breast is negative.   11/15/2018 PET scan   PET 11/15/18 IMPRESSION: Hypermetabolic left breast lesion compatible with known primary. Hypermetabolic left axillary lymph nodes are consistent with metastatic disease.   No evidence for additional hypermetabolic metastatic involvement in the neck, chest, abdomen, or pelvis.   11/17/2018 - 03/01/2019 Chemotherapy   Neo-adjuvant Kadcyla and perjeta q3weeks for 6 cycles starting 11/17/18. Stopped before surgery    11/29/2018 Pathology Results   Diagnosis 1. Breast, left, needle core biopsy, inferior anterior (cylinder clip) - INVASIVE DUCTAL CARCINOMA. - DUCTAL CARCINOMA IN SITU. - LYMPHOVASCULAR INVASION IS IDENTIFIED. - SEE COMMENT. 2. Breast, left, needle core biopsy, central posterior (barbell clip) - INVASIVE DUCTAL CARCINOMA. - LYMPHOVASCULAR INVASION IS IDENTIFIED.   02/12/2019 Breast MRI   IMPRESSION: 1. Complete resolution of previously identified enhancing mass and associated non mass enhancement within the left breast. This is consistent with excellent response to chemotherapy. No residual or suspicious findings are identified. 2. No MRI evidence of malignancy on the right. 3. Previously identified left  axillary lymphadenopathy not definitively seen on today's study. However, this may be due to decreased field-of-view compared to prior study.     03/14/2019 Cancer Staging   Staging form: Breast, AJCC 8th Edition - Pathologic stage from 03/14/2019: pT0, pN0, cM0, GX, ER: Unknown, PR: Unknown, HER2: Not Assessed - Signed by Truitt Merle, MD on 03/28/2019   03/14/2019 Surgery   LEFT MASTECTOMY WITH TARGETED LYMPH NODE DISSECTION and Left Axillary Sentinel Lymph  Node Biopsy by Dr [name] 03/14/19    03/14/2019 Pathology Results   FINAL MICROSCOPIC DIAGNOSIS:   A. LYMPH NODE, LEFT, SENTINEL, BIOPSY:  - There is no evidence of carcinoma in 1 of 1 lymph  node (0/1).   B. BREAST, LEFT, MASTECTOMY:  - Benign breast parenchyma with treatment-related changes.  - There is no evidence of malignancy.  - See oncology table below.   C. LYMPH NODE, LEFT #1, SENTINEL, BIOPSY:  - There is no evidence of carcinoma in 1 of 1 lymph node (0/1).   D. LYMPH NODE, LEFT #2, SENTINEL, BIOPSY:  - There is no evidence of carcinoma in 1 of 1 lymph node (0/1).     04/04/2019 - 04/25/2019 Chemotherapy   Maintenance Herceptin injections every 3 weeks starting 04/03/19 to complete 1 year of treatment that was started in 11/2018. Stopped after 2nd dose as she will not be repeating Echos.    06/03/2021 - 06/24/2021 Chemotherapy   Patient is on Treatment Plan : BREAST Trastuzumab q21d X 11 Cycles     06/25/2021 - 01/01/2022 Chemotherapy   Patient is on Treatment Plan : BREAST Docetaxel + Trastuzumab + Pertuzumab (THP) q21d     06/25/2021 - 01/02/2022 Chemotherapy   Patient is on Treatment Plan : BREAST Docetaxel + Trastuzumab + Pertuzumab (THP) q21d     09/14/2021 Imaging   CT chest/abdomen/pelvis  IMPRESSION:  1. Interval resolution of previously seen bulky left axillary and  subpectoral lymphadenopathy. No persistently enlarged lymph nodes.  2. Multiple liver lesions are significantly diminished in size.  3. Widespread osseous metastatic disease, with an interval increase  in sclerosis of several previously lytic lesions.  4. Constellation of findings is consistent with treatment response  of metastatic disease  5. New, although age indeterminate pathologic wedge deformity of the  T6 vertebral body as well as an increased pathologic wedge deformity  of the L2 vertebral body.  6. Coronary artery disease.  Aortic Atherosclerosis (ICD10-I70.0).    01/26/2022 -  Chemotherapy   Patient is on Treatment Plan : BREAST METASTATIC Fam-Trastuzumab Deruxtecan-nxki (Enhertu) (5.4) q21d     Malignant neoplasm metastatic to liver ([place])  04/17/2021 Imaging   CT ABDOMEN AND  PELVIS WITH CONTRAST: -New lytic lesion involving the L2 vertebral body (6:72, 2:26) with minimal cortical breakthrough superiorly, but with preservation of vertebral body height, concerning for metastatic disease.  -There are several indeterminate hepatic lesions as described above. In addition to the lesions described above, there is an additional subtle 1.5 cm hypodensity in the hepatic dome (2:7). Given clinical history and presence of a new L2 lesion, leading differential consideration is metastatic disease. Recommend further evaluation with MRI of the abdomen with and without contrast.   05/12/2021 Imaging   MRI ABDOMEN WITH AND WITHOUT CONTRAST: Numerous heterogeneously enhancing, internally necrotic lesions in the right hepatic lobe, highly concerning for metastatic disease. Largest lesion measures up to 6.7 cm in hepatic segment VI.   Enhancing osseous lesions in the L2 vertebral body, both iliac bones, and in the sacrum, highly  concerning for osseous metastases.   05/15/2021 Initial Diagnosis   Liver metastases ([place])   05/27/2021 PET scan   1. Widespread recurrent/metastatic disease in this patient who is  status post bilateral mastectomy. Left chest wall recurrence with  left axillary/subpectoral, supraclavicular nodal metastasis.  2. Hepatic, left adrenal, and widespread osseous metastasis.  3. Incidental findings, including: Right nephrolithiasis. Tiny  hiatal hernia.    05/27/2021 Imaging   MRI LUMBAR AND THORACIC SPINE: Thoracic spine:  Osseous metastatic disease involving each level. The most dramatic deposits are at T4 and T6 where there is extraosseous/epidural tumor. No cord compression but there is foraminal impingement on the left at T6-7 and on the right at T3-4. Early dorsal epidural tumor at the level of T10 and T3.   Lumbar spine:  1. Widespread osseous metastatic disease with largest deposit  replacing the L2 body where there is a compression fracture with  mild  height loss. Also at this level is early epidural tumor  extension likely affecting both L2-3 foramina.  2. 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    05/27/2021 Imaging   MRI HEAD WITH AND WITHOUT CONTRAST: Negative for metastatic disease to the brain or calvarium.   06/03/2021 - 06/24/2021 Chemotherapy   Patient is on Treatment Plan : BREAST Trastuzumab q21d X 11 Cycles     06/25/2021 - 01/01/2022 Chemotherapy   Patient is on Treatment Plan : BREAST Docetaxel + Trastuzumab + Pertuzumab (THP) q21d     06/25/2021 - 01/02/2022 Chemotherapy   Patient is on Treatment Plan : BREAST Docetaxel + Trastuzumab + Pertuzumab (THP) q21d     09/14/2021 Imaging   CT chest/abdomen/pelvis  IMPRESSION:  1. Interval resolution of previously seen bulky left axillary and  subpectoral lymphadenopathy. No persistently enlarged lymph nodes.  2. Multiple liver lesions are significantly diminished in size.  3. Widespread osseous metastatic disease, with an interval increase  in sclerosis of several previously lytic lesions.  4. Constellation of findings is consistent with treatment response  of metastatic disease  5. New, although age indeterminate pathologic wedge deformity of the  T6 vertebral body as well as an increased pathologic wedge deformity  of the L2 vertebral body.  6. Coronary artery disease.  Aortic Atherosclerosis (ICD10-I70.0).    01/26/2022 -  Chemotherapy   Patient is on Treatment Plan : BREAST METASTATIC Fam-Trastuzumab Deruxtecan-nxki (Enhertu) (5.4) q21d     Malignant neoplasm metastatic to bone ([place])  04/17/2021 Imaging   CT ABDOMEN AND PELVIS WITH CONTRAST: -New lytic lesion involving the L2 vertebral body (6:72, 2:26) with minimal cortical breakthrough superiorly, but with preservation of vertebral body height, concerning for metastatic disease.  -There are several indeterminate hepatic lesions as described above. In addition to the lesions described above,  there is an additional subtle 1.5 cm hypodensity in the hepatic dome (2:7). Given clinical history and presence of a new L2 lesion, leading differential consideration is metastatic disease. Recommend further evaluation with MRI of the abdomen with and without contrast.   05/12/2021 Imaging   MRI ABDOMEN WITH AND WITHOUT CONTRAST: Numerous heterogeneously enhancing, internally necrotic lesions in the right hepatic lobe, highly concerning for metastatic disease. Largest lesion measures up to 6.7 cm in hepatic segment VI.   Enhancing osseous lesions in the L2 vertebral body, both iliac bones, and in the sacrum, highly concerning for osseous metastases.   05/15/2021 Initial Diagnosis   Bone metastases ([place])   05/27/2021 PET scan   1. Widespread recurrent/metastatic disease in this  patient who is  status post bilateral mastectomy. Left chest wall recurrence with  left axillary/subpectoral, supraclavicular nodal metastasis.  2. Hepatic, left adrenal, and widespread osseous metastasis.  3. Incidental findings, including: Right nephrolithiasis. Tiny  hiatal hernia.    05/27/2021 Imaging   MRI LUMBAR AND THORACIC SPINE: Thoracic spine:  Osseous metastatic disease involving each level. The most dramatic deposits are at T4 and T6 where there is extraosseous/epidural tumor. No cord compression but there is foraminal impingement on the left at T6-7 and on the right at T3-4. Early dorsal epidural tumor at the level of T10 and T3.   Lumbar spine:  1. Widespread osseous metastatic disease with largest deposit  replacing the L2 body where there is a compression fracture with  mild height loss. Also at this level is early epidural tumor  extension likely affecting both L2-3 foramina.  2. 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    05/27/2021 Imaging   MRI HEAD WITH AND WITHOUT CONTRAST: Negative for metastatic disease to the brain or calvarium.   06/03/2021 - 06/24/2021  Chemotherapy   Patient is on Treatment Plan : BREAST Trastuzumab q21d X 11 Cycles     06/25/2021 - 01/01/2022 Chemotherapy   Patient is on Treatment Plan : BREAST Docetaxel + Trastuzumab + Pertuzumab (THP) q21d     06/25/2021 - 01/02/2022 Chemotherapy   Patient is on Treatment Plan : BREAST Docetaxel + Trastuzumab + Pertuzumab (THP) q21d     09/14/2021 Imaging   CT chest/abdomen/pelvis  IMPRESSION:  1. Interval resolution of previously seen bulky left axillary and  subpectoral lymphadenopathy. No persistently enlarged lymph nodes.  2. Multiple liver lesions are significantly diminished in size.  3. Widespread osseous metastatic disease, with an interval increase  in sclerosis of several previously lytic lesions.  4. Constellation of findings is consistent with treatment response  of metastatic disease  5. New, although age indeterminate pathologic wedge deformity of the  T6 vertebral body as well as an increased pathologic wedge deformity  of the L2 vertebral body.  6. Coronary artery disease.  Aortic Atherosclerosis (ICD10-I70.0).    01/26/2022 -  Chemotherapy   Patient is on Treatment Plan : BREAST METASTATIC Fam-Trastuzumab Deruxtecan-nxki (Enhertu) (5.4) q21d         INTERVAL HISTORY:  Michelina is here today for repeat clinical assessment prior to a third cycle of Enhertu.  She continues to tolerate this well without significant difficulty. She denies fevers or chills. She reports continued throbbing pain of her left hand for which she is using Tylenol as needed.  She states she tried the gabapentin but it did not seem to help and caused side effects.  She continues to wear compression sleeve. Her appetite is good. Her weight has decreased 4 pounds over last 3 weeks .  REVIEW OF SYSTEMS:  Review of Systems  Constitutional:  Negative for appetite change, chills, fatigue, fever and unexpected weight change.  HENT:   Negative for lump/mass, mouth sores and sore throat.   Respiratory:   Negative for cough and shortness of breath.   Cardiovascular:  Negative for chest pain and leg swelling.  Gastrointestinal:  Negative for abdominal pain, constipation, diarrhea, nausea and vomiting.  Endocrine: Negative for hot flashes.  Genitourinary:  Negative for difficulty urinating, dysuria, frequency and hematuria.   Musculoskeletal:  Positive for arthralgias. Negative for back pain and myalgias.  Skin:  Negative for rash.  Neurological:  Negative for dizziness and headaches.  Hematological:  Negative for adenopathy.  Does not bruise/bleed easily.  Psychiatric/Behavioral:  Negative for depression and sleep disturbance. The patient is not nervous/anxious.      VITALS:  Blood pressure (!) 159/78, pulse 72, temperature 98.3 F (36.8 C), temperature source Oral, resp. rate 16, height 5' 0.25" (1.53 m), weight 107 lb 11.2 oz (48.9 kg), SpO2 96 %.  Wt Readings from Last 3 Encounters:  03/05/22 107 lb 11.2 oz (48.9 kg)  02/16/22 111 lb 8 oz (50.6 kg)  02/12/22 111 lb 1.6 oz (50.4 kg)    Body mass index is 20.86 kg/m.  Performance status (ECOG): 1 - Symptomatic but completely ambulatory  PHYSICAL EXAM:  Physical Exam Vitals and nursing note reviewed.  Constitutional:      General: She is not in acute distress.    Appearance: Normal appearance.  HENT:     Head: Normocephalic and atraumatic.     Mouth/Throat:     Mouth: Mucous membranes are moist.     Pharynx: Oropharynx is clear. No oropharyngeal exudate or posterior oropharyngeal erythema.  Eyes:     General: No scleral icterus.    Extraocular Movements: Extraocular movements intact.     Conjunctiva/sclera: Conjunctivae normal.     Pupils: Pupils are equal, round, and reactive to light.  Cardiovascular:     Rate and Rhythm: Normal rate and regular rhythm.     Heart sounds: Normal heart sounds. No murmur heard.    No friction rub. No gallop.  Pulmonary:     Effort: Pulmonary effort is normal.     Breath sounds: Normal  breath sounds. No wheezing, rhonchi or rales.  Chest:  Breasts:    Right: Normal.     Left: Absent.     Comments: There are no palpable lesions of the left chest wall Abdominal:     General: There is no distension.     Palpations: Abdomen is soft. There is no hepatomegaly, splenomegaly or mass.     Tenderness: There is no abdominal tenderness.  Musculoskeletal:        General: Normal range of motion.     Cervical back: Normal range of motion and neck supple. No tenderness.     Right lower leg: No edema.     Left lower leg: No edema.  Lymphadenopathy:     Cervical: No cervical adenopathy.     Upper Body:     Right upper body: No supraclavicular or axillary adenopathy.     Left upper body: No supraclavicular or axillary adenopathy.  Skin:    General: Skin is warm and dry.     Coloration: Skin is not jaundiced.     Findings: No rash.  Neurological:     Mental Status: She is alert and oriented to person, place, and time.     Cranial Nerves: No cranial nerve deficit.  Psychiatric:        Mood and Affect: Mood normal.        Behavior: Behavior normal.        Thought Content: Thought content normal.     LABS:      Latest Ref Rng & Units 03/05/2022   10:52 AM 02/12/2022   12:00 AM 01/20/2022   12:00 AM  CBC  WBC 4.0 - 10.5 K/uL 4.0  6.0     12.2      Hemoglobin 12.0 - 15.0 g/dL 12.9  12.1     12.2      Hematocrit 36.0 - 46.0 % 39.1  36     36  Platelets 150 - 400 K/uL 170  148     202         This result is from an external source.      Latest Ref Rng & Units 03/05/2022   10:52 AM 02/12/2022   12:00 AM 01/20/2022   12:00 AM  CMP  Glucose 70 - 99 mg/dL 85     BUN 8 - 23 mg/dL _0 Creatinine 0.44 - 1.00 mg/dL 0.49  0.5     0.5      Sodium 135 - 145 mmol/L 139  134     134      Potassium 3.5 - 5.1 mmol/L 4.0  4.6     4.0      Chloride 98 - 111 mmol/L 108  107     107      CO2 22 - 32 mmol/L _1 Calcium 8.9 - 10.3 mg/dL 8.9  9.2     9.0       Total Protein 6.5 - 8.1 g/dL 6.3     Total Bilirubin 0.3 - 1.2 mg/dL 0.5     Alkaline Phos 38 - 126 U/L 48  52     65      AST 15 - 41 U/L 29  37     34      ALT 0 - 44 U/L _2 This result is from an external source.     Lab Results  Component Value Date   CEA1 1.8 06/03/2021   CEA 0.5 11/13/2012   /  CEA  Date Value Ref Range Status  06/03/2021 1.8 0.0 - 4.7 ng/mL Final    Comment:    (NOTE)                             Nonsmokers          <3.9                             Smokers             <5.6 Roche [employer] Electrochemiluminescence Immunoassay (ECLIA) Values obtained with different assay methods or kits cannot be used interchangeably.  Results cannot be interpreted as absolute evidence of the presence or absence of malignant disease. Performed At: [facility] [place], [place] [id number] Rush Farmer MD [id number]   11/13/2012 0.5 0.0 - 5.0 ng/mL Final   No results found for: "PSA1" No results found for: "CAN199" No results found for: "CAN125"  No results found for: "TOTALPROTELP", "ALBUMINELP", "A1GS", "A2GS", "BETS", "BETA2SER", "GAMS", "MSPIKE", "SPEI" No results found for: "TIBC", "FERRITIN", "IRONPCTSAT" Lab Results  Component Value Date   LDH 186 11/12/2011   LDH 187 09/08/2010   LDH 171 09/09/2009    STUDIES:  No results found.    HISTORY:   Past Medical History:  Diagnosis Date   Arthritis    shoulder   Breast cancer ([place])    Cancer ([place])    left breast ca   Colon cancer ([place])    [name], stage II 11/12/2011   Sigmoid lesion 5.9 cm  40 nodes negative but focus of cancer in a diverticum  Pre-op CEA 5.2 with lab normal up to 2.5 resected 09/21/05   Xeloda adjuvant chemotherapy   Hypertension     Past Surgical History:  Procedure Laterality Date   COLONOSCOPY     HEMICOLECTOMY  2007   MASTECTOMY WITH AXILLARY LYMPH NODE DISSECTION Left 03/14/2019   Procedure: LEFT MASTECTOMY  WITH TARGETED LYMPH NODE DISSECTION;  Surgeon: Jovita Kussmaul, MD;  Location: [facility];  Service: General;  Laterality: Left;   SENTINEL NODE BIOPSY Left 03/14/2019   Procedure: Left Axillary Sentinel Lymph  Node Biopsy;  Surgeon: Jovita Kussmaul, MD;  Location[facility] OR;  Service: General;  Laterality: Left;   TONSILLECTOMY     WISDOM TOOTH EXTRACTION      Family History  Problem Relation Age of Onset   Cancer Maternal Aunt        colon cancer     Social History:  reports that she has never smoked. She has never used smokeless tobacco. She reports that she does not drink alcohol and does not use drugs.The patient is accompanied by her son, Suezanne Jacquet, today.  Allergies: No Known Allergies  Current Medications: Current Outpatient Medications  Medication Sig Dispense Refill   fish oil-omega-3 fatty acids 1000 MG capsule Take 1 g by mouth daily.     gabapentin (NEURONTIN) 100 MG capsule Take 1 capsule (100 mg total) by mouth at bedtime. (Patient not taking: Reported on 03/05/2022) 30 capsule 5   magnesium citrate SOLN Take 0.5 Bottles by mouth as needed for severe constipation.     ondansetron (ZOFRAN) 4 MG tablet Take 1 tablet (4 mg total) by mouth every 8 (eight) hours as needed for nausea or vomiting. (Patient not taking: Reported on 03/05/2022) 20 tablet 2   polyethylene glycol (MIRALAX / GLYCOLAX) 17 g packet Take 17 g by mouth as needed. constipation     No current facility-administered medications for this visit.

## 2022-03-05 NOTE — Assessment & Plan Note (Signed)
History of stage II colon cancer diagnosed in May 2007.  Preop CEA was 5.2. She underwent surgical resection and surgical pathology revealed a 5.9 x 3.8 x 1.2 cm moderate to well-differentiated adenocarcinoma penetrating the muscular wall. Forty lymph nodes negative. No vascular or lymphatic invasion. Multiple diverticula with microabscess formation and inflammation. Carcinoma present in at least 1 diverticulum. She received adjuvant chemotherapy with Xeloda for 9 months. Colonoscopy from August 2013 revealed 2 polyps, which were resected and chronic diverticulosis.

## 2022-03-05 NOTE — Assessment & Plan Note (Addendum)
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  She had recent progression of disease after initial response to trastuzumab/pertuzumab, so is on Enhertu every 3 weeks.  She is tolerating this without significant difficulty.  She will proceed with a third cycle next week.  We will plan to see her back in 3 weeks with a CBC, comprehensive metabolic panel and echocardiogram prior to a fourth cycle of Enhertu.

## 2022-03-05 NOTE — Assessment & Plan Note (Signed)
Bone metastases diagnosed in December 2022. Enhancing osseous lesions are present in the L2 vertebral body, both iliac bones, and in the sacrum, highly concerning for osseous metastases. She was started on zoledronic acid monthly in January.  CT chest, abdomen and pelvis revealed the widespread bone metastases showed an interval increase in sclerosis compared to previous imaging which is consistent with treatment response.  She is now receiving zoledronic acid every 6 weeks to correspond with her chemotherapy.

## 2022-03-06 ENCOUNTER — Other Ambulatory Visit: Payer: Self-pay

## 2022-03-06 ENCOUNTER — Encounter: Payer: Self-pay | Admitting: Oncology

## 2022-03-08 ENCOUNTER — Encounter: Payer: Self-pay | Admitting: Hematology and Oncology

## 2022-03-08 ENCOUNTER — Encounter: Payer: Self-pay | Admitting: Oncology

## 2022-03-08 MED FILL — Dexamethasone Sodium Phosphate Inj 100 MG/10ML: INTRAMUSCULAR | Qty: 1 | Status: AC

## 2022-03-08 MED FILL — Fam-Trastuzumab Deruxtecan-nxki For IV Soln 100 MG: INTRAVENOUS | Qty: 10 | Status: AC

## 2022-03-09 ENCOUNTER — Inpatient Hospital Stay: Payer: Medicare Other

## 2022-03-09 VITALS — BP 126/54 | HR 76 | Temp 97.9°F | Resp 20 | Ht 60.25 in | Wt 108.2 lb

## 2022-03-09 DIAGNOSIS — C50412 Malignant neoplasm of upper-outer quadrant of left female breast: Secondary | ICD-10-CM

## 2022-03-09 DIAGNOSIS — Z5112 Encounter for antineoplastic immunotherapy: Secondary | ICD-10-CM | POA: Diagnosis not present

## 2022-03-09 DIAGNOSIS — C7951 Secondary malignant neoplasm of bone: Secondary | ICD-10-CM

## 2022-03-09 DIAGNOSIS — Z171 Estrogen receptor negative status [ER-]: Secondary | ICD-10-CM

## 2022-03-09 DIAGNOSIS — C787 Secondary malignant neoplasm of liver and intrahepatic bile duct: Secondary | ICD-10-CM

## 2022-03-09 MED ORDER — PALONOSETRON HCL INJECTION 0.25 MG/5ML
0.2500 mg | Freq: Once | INTRAVENOUS | Status: AC
  Administered 2022-03-09: 0.25 mg via INTRAVENOUS
  Filled 2022-03-09: qty 5

## 2022-03-09 MED ORDER — ACETAMINOPHEN 325 MG PO TABS
650.0000 mg | ORAL_TABLET | Freq: Once | ORAL | Status: AC
  Administered 2022-03-09: 650 mg via ORAL
  Filled 2022-03-09: qty 2

## 2022-03-09 MED ORDER — SODIUM CHLORIDE 0.9 % IV SOLN
10.0000 mg | Freq: Once | INTRAVENOUS | Status: AC
  Administered 2022-03-09: 10 mg via INTRAVENOUS
  Filled 2022-03-09: qty 10

## 2022-03-09 MED ORDER — HEPARIN SOD (PORK) LOCK FLUSH 100 UNIT/ML IV SOLN
500.0000 [IU] | Freq: Once | INTRAVENOUS | Status: AC | PRN
  Administered 2022-03-09: 500 [IU]

## 2022-03-09 MED ORDER — SODIUM CHLORIDE 0.9% FLUSH
10.0000 mL | INTRAVENOUS | Status: DC | PRN
  Administered 2022-03-09: 10 mL

## 2022-03-09 MED ORDER — DEXTROSE 5 % IV SOLN: Freq: Once | INTRAVENOUS | Status: AC

## 2022-03-09 MED ORDER — DIPHENHYDRAMINE HCL 25 MG PO CAPS
50.0000 mg | ORAL_CAPSULE | Freq: Once | ORAL | Status: AC
  Administered 2022-03-09: 50 mg via ORAL
  Filled 2022-03-09: qty 2

## 2022-03-09 MED ORDER — FAM-TRASTUZUMAB DERUXTECAN-NXKI CHEMO 100 MG IV SOLR
3.9500 mg/kg | Freq: Once | INTRAVENOUS | Status: AC
  Administered 2022-03-09: 200 mg via INTRAVENOUS
  Filled 2022-03-09: qty 10

## 2022-03-09 NOTE — Patient Instructions (Signed)
[facility]  Discharge Instructions: Thank you for choosing [facility] to provide your oncology and hematology care.  If you have a lab appointment with the [facility], please go directly to the [facility] and check in at the registration area.   Wear comfortable clothing and clothing appropriate for easy access to any Portacath or PICC line.   We strive to give you quality time with your provider. You may need to reschedule your appointment if you arrive late (15 or more minutes).  Arriving late affects you and other patients whose appointments are after yours.  Also, if you miss three or more appointments without notifying the office, you may be dismissed from the clinic at the provider's discretion.      For prescription refill requests, have your pharmacy contact our office and allow 72 hours for refills to be completed.    Today you received the following chemotherapy and/or immunotherapy agents    To help prevent nausea and vomiting after your treatment, we encourage you to take your nausea medication as directed.  BELOW ARE SYMPTOMS THAT SHOULD BE REPORTED IMMEDIATELY: *FEVER GREATER THAN 100.4 F (38 C) OR HIGHER *CHILLS OR SWEATING *NAUSEA AND VOMITING THAT IS NOT CONTROLLED WITH YOUR NAUSEA MEDICATION *UNUSUAL SHORTNESS OF BREATH *UNUSUAL BRUISING OR BLEEDING *URINARY PROBLEMS (pain or burning when urinating, or frequent urination) *BOWEL PROBLEMS (unusual diarrhea, constipation, pain near the anus) TENDERNESS IN MOUTH AND THROAT WITH OR WITHOUT PRESENCE OF ULCERS (sore throat, sores in mouth, or a toothache) UNUSUAL RASH, SWELLING OR PAIN  UNUSUAL VAGINAL DISCHARGE OR ITCHING   Items with * indicate a potential emergency and should be followed up as soon as possible or go to the Emergency Department if any problems should occur.  Please show the CHEMOTHERAPY ALERT CARD or IMMUNOTHERAPY ALERT CARD at check-in to the Emergency  Department and triage nurse.  Should you have questions after your visit or need to cancel or reschedule your appointment, please contact [facility]  Dept: [phone]  and follow the prompts.  Office hours are 8:00 a.m. to 4:30 p.m. Monday - Friday. Please note that voicemails left after 4:00 p.m. may not be returned until the following business day.  We are closed weekends and major holidays. You have access to a nurse at all times for urgent questions. Please call the main number to the clinic Dept: [phone] and follow the prompts.  For any non-urgent questions, you may also contact your provider using MyChart. We now offer e-Visits for anyone 65 and older to request care online for non-urgent symptoms. For details visit [email or web address].   Also download the MyChart app! Go to the app store, search "MyChart", open the app, select [facility], and log in with your MyChart username and password.  Masks are optional in the cancer centers. If you would like for your care team to wear a mask while they are taking care of you, please let them know. You may have one support person who is at least 86 years old accompany you for your appointments. Fam-Trastuzumab Deruxtecan Injection What is this medication? FAM-TRASTUZUMAB DERUXTECAN (fam-tras TOOZ eu mab DER ux TEE kan) treats some types of cancer. It works by blocking a protein that causes cancer cells to grow and multiply. This helps to slow or stop the spread of cancer cells. This medicine may be used for other purposes; ask your health care provider or pharmacist if you have questions. COMMON  BRAND NAME(S): ENHERTU What should I tell my care team before I take this medication? They need to know if you have any of these conditions: Heart disease Heart failure Infection, especially a viral infection, such as chickenpox, cold sores, or herpes Liver disease Lung or breathing disease, such as asthma or COPD An  unusual or allergic reaction to fam-trastuzumab deruxtecan, other medications, foods, dyes, or preservatives Pregnant or trying to get pregnant Breast-feeding How should I use this medication? This medication is injected into a vein. It is given by your care team in a hospital or clinic setting. A special MedGuide will be given to you before each treatment. Be sure to read this information carefully each time. Talk to your care team about the use of this medication in children. Special care may be needed. Overdosage: If you think you have taken too much of this medicine contact a poison control center or emergency room at once. NOTE: This medicine is only for you. Do not share this medicine with others. What if I miss a dose? It is important not to miss your dose. Call your care team if you are unable to keep an appointment. What may interact with this medication? Interactions are not expected. This list may not describe all possible interactions. Give your health care provider a list of all the medicines, herbs, non-prescription drugs, or dietary supplements you use. Also tell them if you smoke, drink alcohol, or use illegal drugs. Some items may interact with your medicine. What should I watch for while using this medication? Visit your care team for regular checks on your progress. Tell your care team if your symptoms do not start to get better or if they get worse. Your condition will be monitored carefully while you are receiving this medication. Do not become pregnant while taking this medication or for 7 months after stopping it. Women should inform their care team if they wish to become pregnant or think they might be pregnant. Men should not father a child while taking this medication and for 4 months after stopping it. There is potential for serious side effects to an unborn child. Talk to your care team for more information. Do not breast-feed an infant while taking this medication or  for 7 months after the last dose. This medication has caused decreased sperm counts in some men. This may make it more difficult to father a child. Talk to your care team if you are concerned about your fertility. This medication may increase your risk to bruise or bleed. Call your care team if you notice any unusual bleeding. Be careful brushing or flossing your teeth or using a toothpick because you may get an infection or bleed more easily. If you have any dental work done, tell your dentist you are receiving this medication. This medication may cause dry eyes and blurred vision. If you wear contact lenses, you may feel some discomfort. Lubricating eye drops may help. See your care team if the problem does not go away or is severe. This medication may increase your risk of getting an infection. Call your care team for advice if you get a fever, chills, sore throat, or other symptoms of a cold or flu. Do not treat yourself. Try to avoid being around people who are sick. Avoid taking medications that contain aspirin, acetaminophen, ibuprofen, naproxen, or ketoprofen unless instructed by your care team. These medications may hide a fever. What side effects may I notice from receiving this medication? Side effects that  you should report to your care team as soon as possible: Allergic reactions--skin rash, itching, hives, swelling of the face, lips, tongue, or throat Dry cough, shortness of breath or trouble breathing Infection--fever, chills, cough, sore throat, wounds that don't heal, pain or trouble when passing urine, general feeling of discomfort or being unwell Heart failure--shortness of breath, swelling of the ankles, feet, or hands, sudden weight gain, unusual weakness or fatigue Unusual bruising or bleeding Side effects that usually do not require medical attention (report these to your care team if they continue or are bothersome): Constipation Diarrhea Hair loss Muscle  pain Nausea Vomiting This list may not describe all possible side effects. Call your doctor for medical advice about side effects. You may report side effects to FDA at 1-800-FDA-1088. Where should I keep my medication? This medication is given in a hospital or clinic. It will not be stored at home. NOTE: This sheet is a summary. It may not cover all possible information. If you have questions about this medicine, talk to your doctor, pharmacist, or health care provider.  2023 Elsevier/Gold Standard (2021-01-07 00:00:00)

## 2022-03-11 ENCOUNTER — Ambulatory Visit: Payer: Medicare Other | Attending: Oncology | Admitting: Occupational Therapy

## 2022-03-11 DIAGNOSIS — I972 Postmastectomy lymphedema syndrome: Secondary | ICD-10-CM | POA: Diagnosis not present

## 2022-03-11 DIAGNOSIS — M25642 Stiffness of left hand, not elsewhere classified: Secondary | ICD-10-CM

## 2022-03-11 DIAGNOSIS — M6281 Muscle weakness (generalized): Secondary | ICD-10-CM

## 2022-03-11 NOTE — Therapy (Signed)
[facility] [address], [place], [address] Phone: [phone]   Fax:  [phone]  Occupational Therapy Treatment  Patient Details  Name: Jasmin Lewis MRN: [id number] Date of Birth: 1932/01/04 Referring Provider (OT): Elliot Cousin   Encounter Date: 03/11/2022   OT End of Session - 03/11/22 1514     Visit Number 6    Number of Visits 12    Date for OT Re-Evaluation 04/22/22    OT Start Time 1316    OT Stop Time 1420    OT Time Calculation (min) 64 min    Activity Tolerance Patient tolerated treatment well    Behavior During Therapy [facility] for tasks assessed/performed             Past Medical History:  Diagnosis Date   Arthritis    shoulder   Breast cancer ([place])    Cancer ([place])    left breast ca   Colon cancer ([place])    [name], stage II 11/12/2011   Sigmoid lesion 5.9 cm  40 nodes negative but focus of cancer in a diverticum   Pre-op CEA 5.2 with lab normal up to 2.5 resected 09/21/05   Xeloda adjuvant chemotherapy   Hypertension     Past Surgical History:  Procedure Laterality Date   COLONOSCOPY     HEMICOLECTOMY  2007   MASTECTOMY WITH AXILLARY LYMPH NODE DISSECTION Left 03/14/2019   Procedure: LEFT MASTECTOMY WITH TARGETED LYMPH NODE DISSECTION;  Surgeon: Jovita Kussmaul, MD;  Location: [facility];  Service: General;  Laterality: Left;   SENTINEL NODE BIOPSY Left 03/14/2019   Procedure: Left Axillary Sentinel Lymph  Node Biopsy;  Surgeon: Jovita Kussmaul, MD;  Location: [facility];  Service: General;  Laterality: Left;   TONSILLECTOMY     WISDOM TOOTH EXTRACTION      There were no vitals filed for this visit.   Subjective Assessment - 03/11/22 1512     Pertinent History Malignant neoplasm of upper-outer quadrant of left breast in female, estrogen receptor negative ([place])  Stage IIB (T2c N1 M0) HER2 receptor positive left breast cancer, diagnosed in June 2020. This was ER/PR negative. This  was treated with neoadjuvant Kadcyla/Perjeta and left mastectomy, with a complete response in breast and nodes. She had only received two doses of adjuvant Herceptin before being discontinued in December 2020.     History of colon cancer, stage II  History of stage II colon cancer, diagnosed in May 2007. This was treated with surgical resection and adjuvant chemotherapy Malignant neoplasm metastatic to liver [facility])  Liver metastases discovered 04/17/21.  Numerous heterogeneously enhancing, internally necrotic lesions in the right hepatic lobe, consistent with metastatic disease. Largest lesion measures up to 6.7 cm and there are at least 6 lesions. We now have biopsy proven HER2 positive recurrent breast cancer with ER/PR negative. She is receiving THP.CT scan now shows excellent response with significant decrease in the size of her liver lesions.     Malignant neoplasm metastatic to bone [facility])  Bone metastases discovered 04/17/21. Enhancing osseous lesions are present in the L2 vertebral body, both iliac bones, and in the sacrum, highly concerning for osseous metastases. She received her 1st dose of zoledronic acid on January 16th.  I once again today explained to her and her son the reasoning for this medicine.  Her widespread bone metastases show an interval increase in sclerosis of previously lytic lesions consistent with treatment response.  Chest wall recurrence of breast cancer, left ([name])  Left supraclavicular lymph node measuring 2-3 cm. Also involvement of subpectoral node and chest wall skin. This was rapidly progressive despite receiving  trastuzumab.  We have added in Perjeta and Taxotere to her current regimen and this has been very effective.  The CT scan also shows good response of her adenopathy with resolution of the previous bulky left axillary and subpectoral adenopathy. Today, I do not appreciate axillary nodes. However, she does have a 2 cm nodule noted to the left chest that is new. This is  located close to the left shoulder. Of note, she does have existing lymphedema to the left arm; however, this is worse today, most likely from a bee sting she suffered to the left hand. Her arm appears red and is warm to touch. We will treat this with antibiotics.  Refer to lymphedema clinic.    Patient Stated Goals I just want to see if I can get the swelling better in my left arm and hand, in the motion, strength and use of my left dominant hand.    Currently in Pain? Yes   No number provided.               [facility] OT Assessment - 03/11/22 0001       Left Hand AROM   L Index  MCP 0-90 60 Degrees    L Index PIP 0-100 30 Degrees    L Long  MCP 0-90 80 Degrees    L Long PIP 0-100 60 Degrees    L Ring  MCP 0-90 70 Degrees    L Ring PIP 0-100 60 Degrees    L Little  MCP 0-90 60 Degrees    L Little PIP 0-100 30 Degrees             LYMPHEDEMA/ONCOLOGY QUESTIONNAIRE - 03/11/22 0001       Left Upper Extremity Lymphedema   15 cm Proximal to Olecranon Process 27 cm    10 cm Proximal to Olecranon Process 27 cm    Olecranon Process 23 cm    15 cm Proximal to Ulnar Styloid Process 22 cm    10 cm Proximal to Ulnar Styloid Process 18 cm    Just Proximal to Ulnar Styloid Process 15.3 cm    Across Hand at [employer] 16.6 cm                     OT Treatments/Exercises (OP) - 03/11/22 0001       Moist Heat Therapy   Number Minutes Moist Heat 8 Minutes    Moist Heat Location --   digits wrap in flexion - 2 x 2 min inbetween ROM           Pt arrive with no compression on left upper extremity.  Per son and patient patient had it off about 2 hours now.       Measurements taken for left upper extremity circumference compared to last time -doing very well with maintaining it the last 2 sessions.  Upper arm did increase 1.5 to 2 cm.  And proximal forearm 0.6 cm.   Was able to get pink ribbon fund assistance to cover patient CircAid garments.   Was able to contact  [facility] today-patient able to get measured today for CircAid garment as well as gauntlet handpiece. Patient in the meantime can continue to wear her old compression sleeve during the day- ed pt and son on  putting bag over hand to slide sleeve over hand -and continue with istoner glove   Cont to  wear at night time -Soft stockinette on left upper extremity prior  - fitted with Rosidal foam from hand to axilla over lap 50%  Followed by  6 cm short stretch bandage starting at the wrist, through the webspace over the hand  3 x and figure eights up to proximal to elbow     8 cm short stretch bandage anchored around the wrist through the webspace  1 x - over the hand and circular overlap 50% up to axilla.      Patient with great decongestion of L UE. -Allowing OT to be able to focus more on range of motion and contrast /moist heat in session.  Done this date 3 times a light digit flexion compression wrap using a 8 cm short stretch. Patient kept on 2 minutes at a time with moist heat.             Done several reps and sets of passive range of motion to [facility] flexion as well as PIP flexion as well as composite flexion and extension of digits.  Done several times of reps and sets of placed on hold for composite fist.  Patient do fatigue easily.  Do show some ability during session to do active MC flexion more than PIP of composite flexion.  Does much better with placed on hold. Patient this day show more flexion at [facility] being 70 to 80 degrees.  As well as for all 4 digits. Increased PIP flexion also at third fourth and fifth. Encouraged patient and son to do range of motion home exercises every 2 hours to keep it flexible to be able to activate more active range of motion.   Patient did show this date also some wrist flexion against gravity active motion   HEP for AAROM and PROM o digits MC flexion, PIP /intrinsic fist  FOCUS ON THIS DATE, wrist flexion, ext, RD< UD , sup /pro ; elbow  flexion, ext and shoulder flexion PROM - Pt can do in supine too Done this date light coban flexion wrap for 3 min prior to Place and hold and PROM  Plan to make pt flexion glove to use at home for next time    Also send email to breast cancer navigator to check into pink ribbon fund assistance for patient out of [facility].  Circ Aid compression garment upper extremity or would need to look for other funding.  Await answer.     Patient to follow-up with me in 10 days or if CircAid's comes in earlier.       OT Education - 03/11/22 1514     Education Details progress and changes to HEP    Person(s) Educated Patient;Child(ren)    Methods Explanation;Demonstration;Tactile cues;Verbal cues;Handout    Comprehension Verbal cues required;Returned demonstration;Verbalized understanding                 OT Long Term Goals - 01/28/22 2117       OT LONG TERM GOAL #1   Title Patient and son demo understanding to do home program to decrease circumference in left upper extremity to get measured for appropriate compression garment for home use.    Baseline No knowledge of bandaging and safely donning compression garment because of weakness in digits and wrist extension.  As well as flexion.    Time 4    Period Weeks  Status New    Target Date 02/25/22      OT LONG TERM GOAL #2   Title Left upper extremity circumference decreased by 2 cm in forearm and upper arm and 1 cm in hand and forearm to be fitted for appropriate compression garment to use safely at home to prevent infection.    Baseline No knowledge of home program to maintain circumference and prevent infection.    Time 6    Period Weeks    Status New    Target Date 03/11/22      OT LONG TERM GOAL #3   Title Patient show increase  L forearm pronation, wrist flexion and digits flexion in left arm to initiate use of left hand and ADLs.    Baseline Patient unable to use left hand and bathing and dressing or any  ADLs unable to initiate digit flexion to make a fist or pinch.    Time 8    Period Weeks    Status New    Target Date 03/25/22                   Plan - 03/11/22 1514     Clinical Impression Statement Patient presented OT evaluation with a diagnosis of left upper extremity lymphedema.  Patient report lymphedema started about December 2022.  Was fitted with a compression sleeve.  But it got worse gradually with hand worsening after being stung by a wasp on the left hand few months after that.. Chest wall recurrence of breast cancer, left ([name])  Left supraclavicular lymph node measuring 2-3 cm. Also involvement of subpectoral node and chest wall skin. This was rapidly progressive despite receiving  trastuzumab.  They added in Perjeta and Taxotere to her current regimen and this has been very effective.  The CT scan also shows good response of her adenopathy with resolution of the previous bulky left axillary and subpectoral adenopathy.  With 2 cm nodule noted to the left chest that is new. This is located close to the left shoulder.  Patient was treated with antibiotics after wasp sting.  Patient is left-hand dominant -last 2 visits had pt wear isotoner glove with Tubigrip D and E during the day.  At nighttime Isotoner glove and son bandaged with 1 Rosidal foam layer and short stretch 6 and 8 cm gradient. Pt arrive this date with great progress in lymphedema circumference still - did increase some in upper arm 1.5 -2 cm but hand and wrist doing well. Pt and son report she is wearing old daytime compression sleeve during day with isotoner glove.  And wrapping at night time. DId get pink ribbon fund assistance and pt getting measured this afternoon  for circ aid compression that she can use daytime and night time. Done this date moist heat and wrap digits in flexion few times during session prior to  PROM and place and hold for [facility] and PIP flexion. SHowed increase PROM in all digits and joints -and  increase place and hold ROM at [facility] and all digits. Appear numbness more in 4thand 5th digits- ? ulnar N compression. Questioning reason for weakness in proximal muscles for pronation as well as wrist flexion.  Reinforced with patient and son again to continue with compression at home that OT can focus on range of motion to digits. Pt and son encourage to do digits flexion stretches and ROM 5 x day.  Patient can benefit from skilled OT services to decrease circumference of left upper extremity, fitting  of appropriate compression and possibly increased range of motion/strength to increase functional use of left dominant hand for quality of life performing ADLs.    OT Occupational Profile and History Problem Focused Assessment - Including review of records relating to presenting problem    Occupational performance deficits (Please refer to evaluation for details): ADL's;IADL's;Play;Leisure;Social Participation    Body Structure / Function / Physical Skills ADL;Coordination;Sensation;Flexibility;IADL;ROM;Edema;Dexterity;Strength;Decreased knowledge of precautions    Rehab Potential Fair    Clinical Decision Making Several treatment options, min-mod task modification necessary    Comorbidities Affecting Occupational Performance: May have comorbidities impacting occupational performance    Modification or Assistance to Complete Evaluation  Min-Moderate modification of tasks or assist with assess necessary to complete eval    OT Frequency 1x / week    OT Duration 12 weeks    OT Treatment/Interventions Self-care/ADL training;Manual lymph drainage;Compression bandaging;Therapeutic exercise;Contrast Bath;Manual Therapy;Patient/family education;Passive range of motion    Consulted and Agree with Plan of Care Patient;Family member/caregiver             Patient will benefit from skilled therapeutic intervention in order to improve the following deficits and impairments:   Body Structure / Function / Physical  Skills: ADL, Coordination, Sensation, Flexibility, IADL, ROM, Edema, Dexterity, Strength, Decreased knowledge of precautions       Visit Diagnosis: Postmastectomy lymphedema syndrome  Muscle weakness (generalized)  Stiffness of left hand, not elsewhere classified    Problem List Patient Active Problem List   Diagnosis Date Noted   Lymphedema of left upper extremity 02/05/2022    Class: Chronic   Secondary malignant neoplasm of bone and bone marrow ([place]) 09/07/2021   Chest wall recurrence of breast cancer, left ([place]) 06/24/2021    Class: Diagnosis of   Dehydration 05/25/2021   Malignant neoplasm metastatic to liver ([place]) 05/15/2021    Class: Diagnosis of   Malignant neoplasm metastatic to bone ([place]) 05/15/2021    Class: Acute   Hypercalcemia 05/15/2021    Class: Acute   Brachial plexus injury, left 04/27/2021    Class: Chronic   Malignant neoplasm of upper-outer quadrant of left breast in female, estrogen receptor negative ([place]) 11/02/2018   Colon polyps 05/19/2018    Class: History of   History of colon cancer, stage II 11/12/2011    Rosalyn Gess, OTR/L,CLT 03/11/2022, 3:20 PM  [facility] [address], [place], [address] Phone: [phone]   Fax:  [phone]  Name: MAUDRY ZEIDAN MRN: [id number] Date of Birth: [birth year]

## 2022-03-23 ENCOUNTER — Ambulatory Visit: Payer: Medicare Other | Admitting: Occupational Therapy

## 2022-03-23 DIAGNOSIS — I972 Postmastectomy lymphedema syndrome: Secondary | ICD-10-CM | POA: Diagnosis not present

## 2022-03-23 DIAGNOSIS — M25642 Stiffness of left hand, not elsewhere classified: Secondary | ICD-10-CM

## 2022-03-23 DIAGNOSIS — M6281 Muscle weakness (generalized): Secondary | ICD-10-CM

## 2022-03-23 NOTE — Progress Notes (Signed)
Patient Care Team: [name], MD as PCP - General (Family Medicine) [name], MD as Consulting Physician (Oncology)  Clinic Day: 03/25/2022  Referring physician: [name], MD  ASSESSMENT & PLAN:   Assessment & Plan: Malignant neoplasm of upper-outer quadrant of left breast in female, estrogen receptor negative ([place]) Stage IIB (T2c N1 M0) HER2 receptor positive left breast cancer, diagnosed in June 2020. This was ER/PR negative. This was treated with neoadjuvant Kadcyla/Perjeta and left mastectomy, with a complete response in breast and nodes. She had only received two doses of adjuvant Herceptin before being discontinued in December 2020.   History of colon cancer, stage II History of stage II colon cancer, diagnosed in May 2007. This was treated with surgical resection and adjuvant chemotherapy with Xeloda. Colonoscopy from 2013 revealed 2 polyps which were resected and chronic diverticulosis.   Malignant neoplasm metastatic to liver [facility]) Liver metastases discovered 04/17/21.  Numerous heterogeneously enhancing, internally necrotic lesions in the right hepatic lobe, consistent with metastatic disease. Largest lesion measures up to 6.7 cm and there are at least 6 lesions. We now have biopsy proven HER2 positive recurrent breast cancer with ER/PR negative. She is receiving THP and is tolerating it without significant difficulty. She had improvement in her liver function tests after 1 cycle, and they are now normal.  CT scan showed excellent response with significant decrease in the size of her liver lesions.   Malignant neoplasm metastatic to bone [facility]) Bone metastases discovered 04/17/21. Enhancing osseous lesions are present in the L2 vertebral body, both iliac bones, and in the sacrum, highly concerning for osseous metastases. She received her 1st dose of zoledronic acid on January 16th.  I once again today explained to her and her son the reasoning for this  medicine.  Her widespread bone metastases show an interval increase in sclerosis of previously lytic lesions consistent with treatment response.   Chest wall recurrence of breast cancer, left ([name]) Left supraclavicular lymph node measuring 2-3 cm. Also involvement of subpectoral node and chest wall skin. This was rapidly progressive despite receiving  trastuzumab.  We have added in Perjeta and Taxotere to her current regimen and this has been very effective.  The CT scan also shows good response of her adenopathy with resolution of the previous bulky left axillary and subpectoral adenopathy. In September of 2023, she had a 2 cm nodule  located close to the left shoulder associated with nodularity of the left upper chest wall and appeared to have obvious progression of disease again. She was changed to [facility] and is responding well.  Lymphedema She does have existing lymphedema to the left arm; however, this is worse today, with significant edema of the hand as well. We will refer her to a lymphedema specialist and she requests one in [place], so we will arrange that. She sleeps with the hand and arm elevated.  She has decreased use of her left hand but I think this is due to more than just the lymphedema.   She had definite progression of disease in September, evident on the left upper chest wall again, and so we switched her treatment to Enhertu. She is clearly responding already. She has seen the lymphedema specialist and is well pleased with her recommendations. I will see her back in 3 weeks with CBC and CMP for  her 5th cycle of Enhertu. The patient and her son understand the plans discussed today and are in agreement with them.  She knows to contact  our office if she develops concerns prior to her next appointment.     [name], MD  [facility] AT [facility] [address] [place] [address] Dept: [phone] Dept Fax:  [phone]   Orders Placed This Encounter  Procedures   CBC and differential    This external order was created through the Results Console.   CBC    This external order was created through the Results Console.      CHIEF COMPLAINT:  CC: An 88 year old female with metastatic breast cancer   Current Treatment:  Planning Enhertu  INTERVAL HISTORY:  Jasmin Lewis is here today for repeat clinical assessment prior to her 4th cycle of Enhertu. Her biggest problem is losing the use of her left hand and worsening lymphedema of the left arm.  She has been seen by a lymphedema specialist in [place], and they are off to a good start. The disease of her left upper chest wall was progressing again, with a hard 3 cm nodule and multiple smaller nodules of the upper left chest wall. We switched her to [facility] and she is responding well, with near resolution of the skin findings. Echocardiogram was done yesterday and looks great, with an EF of 60-65%. She denies fevers or chills. She denies pain. Her appetite is good. Her weight is stable over the last 3 weeks but some of this is the lymphedema fluid.  I have reviewed the past medical history, past surgical history, social history and family history with the patient and they are unchanged from previous note.  ALLERGIES:  has No Known Allergies.  MEDICATIONS:  Current Outpatient Medications  Medication Sig Dispense Refill   fish oil-omega-3 fatty acids 1000 MG capsule Take 1 g by mouth daily.     gabapentin (NEURONTIN) 100 MG capsule Take 1 capsule (100 mg total) by mouth at bedtime. (Patient not taking: Reported on 03/05/2022) 30 capsule 5   magnesium citrate SOLN Take 0.5 Bottles by mouth as needed for severe constipation.     ondansetron (ZOFRAN) 4 MG tablet Take 1 tablet (4 mg total) by mouth every 8 (eight) hours as needed for nausea or vomiting. (Patient not taking: Reported on 03/05/2022) 20 tablet 2   polyethylene glycol (MIRALAX / GLYCOLAX) 17 g  packet Take 17 g by mouth as needed. constipation     No current facility-administered medications for this visit.    HISTORY OF PRESENT ILLNESS:   Oncology History Overview Note  Cancer Staging Malignant neoplasm of upper-outer quadrant of left breast in female, estrogen receptor negative ([place]) Staging form: Breast, AJCC 8th Edition - Clinical stage from 10/11/2018: Stage IIB (cT2, cN1, cM0, G3, ER-, PR-, HER2+) - Signed by Truitt Merle, MD on 11/02/2018 - Clinical: No stage assigned - Unsigned    History of colon cancer, stage II  09/2005 Initial Diagnosis   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 Findings were a 5.9 x 3.8 x 1.2 cm moderate to well-differentiated adenocarcinoma penetrating the muscular wall. Forty lymph nodes negative. No vascular or lymphatic invasion. Multiple diverticula with microabscess formation and inflammation. Carcinoma present in at least 1 diverticulum. Preop CEA 5.2 with lab normal range 0 to 2.5. Preop CT scan with no obvious additional pathology.    2007 -  Chemotherapy   She received oral Xeloda chemotherapy as an adjuvant. She declined treatment on a clinical  trial.    11/12/2011 Initial Diagnosis   [name], stage II   12/09/2011 Procedure   Followup colonoscopy done on 12/09/2011. She was found to have 2 polyps which were removed. Chronic diverticulosis.     Malignant neoplasm of upper-outer quadrant of left breast in female, estrogen receptor negative ([place])  09/26/2018 Mammogram   Mammogram/US of left breast 09/26/18 IMPRESSION:  1. 2.9cm irregular mass in the upper outer left breast corresponds to the palpable abnormality. This is highly suspicious for breast carcinoma.  2. Two adjacent abnormal left axillary  Lymph nodes suspicious for metastatic adenopathy. There is a third borderline abnormal left axillary LN with a cortex thickened to 58m.  3.  Benign right breast cyst. No evidence of right breast malignancy.    10/11/2018 Cancer Staging   Staging form: Breast, AJCC 8th Edition - Clinical stage from 10/11/2018: Stage IIB (cT2, cN1, cM0, G3, ER-, PR-, HER2+) - Signed by [name] MD on 11/02/2018   10/11/2018 Initial Biopsy   Diagnosis 10/11/18 1. Breast, left, needle core biopsy, upper outer left 2 o'clock - INVASIVE DUCTAL CARCINOMA. - DUCTAL CARCINOMA IN SITU. - LYMPHOVASCULAR INVASION IS IDENTIFIED. - SEE COMMENT. 2. Lymph node, needle/core biopsy, left axilla - INVASIVE DUCTAL CARCINOMA. - SEE COMMENT.   10/11/2018 Receptors her2   The tumor cells are POSITIVE for Her2 (3+). Estrogen Receptor: 0%, NEGATIVE Progesterone Receptor: 0%, NEGATIVE Proliferation Marker Ki67: 70%   11/02/2018 Initial Diagnosis   Malignant neoplasm of upper-outer quadrant of left breast in [place], [place]   11/15/2018 Breast MRI   MRI breast 11/15/18  IMPRESSION: 1. 3.3 centimeter mass in the LATERAL portion of the LEFT breast consistent with known malignancy. 2. There is significant non mass enhancement surrounding this mass and extending anteriorly into the nipple base, with largest diameter in the anterior to posterior axis, measuring 7.2 centimeters. 3. If the patient would consider breast conservation, additional MR guided core biopsies are recommended. Consider biopsy of the inferior and anterior extent of the non mass enhancement to document extent of disease. 4. Three enlarged LEFT axillary lymph nodes. 5. RIGHT breast is negative.   11/15/2018 PET scan   PET 11/15/18 IMPRESSION: Hypermetabolic left breast lesion compatible with known primary. Hypermetabolic left axillary lymph nodes are consistent with metastatic disease.   No evidence for additional hypermetabolic metastatic involvement in the neck, chest, abdomen, or pelvis.   11/17/2018 - 03/01/2019 Chemotherapy   Neo-adjuvant Kadcyla and perjeta q3weeks for 6  cycles starting 11/17/18. Stopped before surgery    11/29/2018 Pathology Results   Diagnosis 1. Breast, left, needle core biopsy, inferior anterior (cylinder clip) - INVASIVE DUCTAL CARCINOMA. - DUCTAL CARCINOMA IN SITU. - LYMPHOVASCULAR INVASION IS IDENTIFIED. - SEE COMMENT. 2. Breast, left, needle core biopsy, central posterior (barbell clip) - INVASIVE DUCTAL CARCINOMA. - LYMPHOVASCULAR INVASION IS IDENTIFIED.   02/12/2019 Breast MRI   IMPRESSION: 1. Complete resolution of previously identified enhancing mass and associated non mass enhancement within the left breast. This is consistent with excellent response to chemotherapy. No residual or suspicious findings are identified. 2. No MRI evidence of malignancy on the right. 3. Previously identified left axillary lymphadenopathy not definitively seen on today's study. However, this may be due to decreased field-of-view compared to prior study.     03/14/2019 Cancer Staging   Staging form: Breast, AJCC 8th Edition - Pathologic stage from 03/14/2019: pT0, pN0, cM0, GX, ER: Unknown, PR: Unknown, HER2: Not Assessed - Signed by [name] MD on  03/28/2019   03/14/2019 Surgery   LEFT MASTECTOMY WITH TARGETED LYMPH NODE DISSECTION and Left Axillary Sentinel Lymph  Node Biopsy by Dr [name] 03/14/19    03/14/2019 Pathology Results   FINAL MICROSCOPIC DIAGNOSIS:   A. LYMPH NODE, LEFT, SENTINEL, BIOPSY:  - There is no evidence of carcinoma in 1 of 1 lymph node (0/1).   B. BREAST, LEFT, MASTECTOMY:  - Benign breast parenchyma with treatment-related changes.  - There is no evidence of malignancy.  - See oncology table below.   C. LYMPH NODE, LEFT #1, SENTINEL, BIOPSY:  - There is no evidence of carcinoma in 1 of 1 lymph node (0/1).   D. LYMPH NODE, LEFT #2, SENTINEL, BIOPSY:  - There is no evidence of carcinoma in 1 of 1 lymph node (0/1).     04/04/2019 - 04/25/2019 Chemotherapy   Maintenance Herceptin injections every 3 weeks starting  04/03/19 to complete 1 year of treatment that was started in 11/2018. Stopped after 2nd dose as she will not be repeating [place].    06/03/2021 - 06/24/2021 Chemotherapy   Patient is on Treatment Plan : BREAST Trastuzumab q21d X 11 Cycles     06/25/2021 - 01/01/2022 Chemotherapy   Patient is on Treatment Plan : BREAST Docetaxel + Trastuzumab + Pertuzumab (THP) q21d     06/25/2021 - 01/02/2022 Chemotherapy   Patient is on Treatment Plan : BREAST Docetaxel + Trastuzumab + Pertuzumab (THP) q21d     09/14/2021 Imaging   CT chest/abdomen/pelvis  IMPRESSION:  1. Interval resolution of previously seen bulky left axillary and  subpectoral lymphadenopathy. No persistently enlarged lymph nodes.  2. Multiple liver lesions are significantly diminished in size.  3. Widespread osseous metastatic disease, with an interval increase  in sclerosis of several previously lytic lesions.  4. Constellation of findings is consistent with treatment response  of metastatic disease  5. New, although age indeterminate pathologic wedge deformity of the  T6 vertebral body as well as an increased pathologic wedge deformity  of the L2 vertebral body.  6. Coronary artery disease.  Aortic Atherosclerosis (ICD10-I70.0).    01/26/2022 -  Chemotherapy   Patient is on Treatment Plan : BREAST METASTATIC Fam-Trastuzumab Deruxtecan-nxki (Enhertu) (5.4) q21d     Malignant neoplasm metastatic to liver ([place])  04/17/2021 Imaging   CT ABDOMEN AND PELVIS WITH CONTRAST: -New lytic lesion involving the L2 vertebral body (6:72, 2:26) with minimal cortical breakthrough superiorly, but with preservation of vertebral body height, concerning for metastatic disease.  -There are several indeterminate hepatic lesions as described above. In addition to the lesions described above, there is an additional subtle 1.5 cm hypodensity in the hepatic dome (2:7). Given clinical history and presence of a new L2 lesion, leading differential consideration is  metastatic disease. Recommend further evaluation with MRI of the abdomen with and without contrast.   05/12/2021 Imaging   MRI ABDOMEN WITH AND WITHOUT CONTRAST: Numerous heterogeneously enhancing, internally necrotic lesions in the right hepatic lobe, highly concerning for metastatic disease. Largest lesion measures up to 6.7 cm in hepatic segment VI.   Enhancing osseous lesions in the L2 vertebral body, both iliac bones, and in the sacrum, highly concerning for osseous metastases.   05/15/2021 Initial Diagnosis   Liver metastases ([place])   05/27/2021 PET scan   1. Widespread recurrent/metastatic disease in this patient who is  status post bilateral mastectomy. Left chest wall recurrence with  left axillary/subpectoral, supraclavicular nodal metastasis.  2. Hepatic, left adrenal, and widespread osseous metastasis.  3. Incidental  findings, including: Right nephrolithiasis. Tiny  hiatal hernia.    05/27/2021 Imaging   MRI LUMBAR AND THORACIC SPINE: Thoracic spine:  Osseous metastatic disease involving each level. The most dramatic deposits are at T4 and T6 where there is extraosseous/epidural tumor. No cord compression but there is foraminal impingement on the left at T6-7 and on the right at T3-4. Early dorsal epidural tumor at the level of T10 and T3.   Lumbar spine:  1. Widespread osseous metastatic disease with largest deposit  replacing the L2 body where there is a compression fracture with  mild height loss. Also at this level is early epidural tumor  extension likely affecting both L2-3 foramina.  2. 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    05/27/2021 Imaging   MRI HEAD WITH AND WITHOUT CONTRAST: Negative for metastatic disease to the brain or calvarium.   06/03/2021 - 06/24/2021 Chemotherapy   Patient is on Treatment Plan : BREAST Trastuzumab q21d X 11 Cycles     06/25/2021 - 01/01/2022 Chemotherapy   Patient is on Treatment Plan : BREAST Docetaxel +  Trastuzumab + Pertuzumab (THP) q21d     06/25/2021 - 01/02/2022 Chemotherapy   Patient is on Treatment Plan : BREAST Docetaxel + Trastuzumab + Pertuzumab (THP) q21d     09/14/2021 Imaging   CT chest/abdomen/pelvis  IMPRESSION:  1. Interval resolution of previously seen bulky left axillary and  subpectoral lymphadenopathy. No persistently enlarged lymph nodes.  2. Multiple liver lesions are significantly diminished in size.  3. Widespread osseous metastatic disease, with an interval increase  in sclerosis of several previously lytic lesions.  4. Constellation of findings is consistent with treatment response  of metastatic disease  5. New, although age indeterminate pathologic wedge deformity of the  T6 vertebral body as well as an increased pathologic wedge deformity  of the L2 vertebral body.  6. Coronary artery disease.  Aortic Atherosclerosis (ICD10-I70.0).    01/26/2022 -  Chemotherapy   Patient is on Treatment Plan : BREAST METASTATIC Fam-Trastuzumab Deruxtecan-nxki (Enhertu) (5.4) q21d     Malignant neoplasm metastatic to bone ([place])  04/17/2021 Imaging   CT ABDOMEN AND PELVIS WITH CONTRAST: -New lytic lesion involving the L2 vertebral body (6:72, 2:26) with minimal cortical breakthrough superiorly, but with preservation of vertebral body height, concerning for metastatic disease.  -There are several indeterminate hepatic lesions as described above. In addition to the lesions described above, there is an additional subtle 1.5 cm hypodensity in the hepatic dome (2:7). Given clinical history and presence of a new L2 lesion, leading differential consideration is metastatic disease. Recommend further evaluation with MRI of the abdomen with and without contrast.   05/12/2021 Imaging   MRI ABDOMEN WITH AND WITHOUT CONTRAST: Numerous heterogeneously enhancing, internally necrotic lesions in the right hepatic lobe, highly concerning for metastatic disease. Largest lesion measures up to 6.7 cm  in hepatic segment VI.   Enhancing osseous lesions in the L2 vertebral body, both iliac bones, and in the sacrum, highly concerning for osseous metastases.   05/15/2021 Initial Diagnosis   Bone metastases ([place])   05/27/2021 PET scan   1. Widespread recurrent/metastatic disease in this patient who is  status post bilateral mastectomy. Left chest wall recurrence with  left axillary/subpectoral, supraclavicular nodal metastasis.  2. Hepatic, left adrenal, and widespread osseous metastasis.  3. Incidental findings, including: Right nephrolithiasis. Tiny  hiatal hernia.    05/27/2021 Imaging   MRI LUMBAR AND THORACIC SPINE: Thoracic spine:  Osseous metastatic disease involving  each level. The most dramatic deposits are at T4 and T6 where there is extraosseous/epidural tumor. No cord compression but there is foraminal impingement on the left at T6-7 and on the right at T3-4. Early dorsal epidural tumor at the level of T10 and T3.   Lumbar spine:  1. Widespread osseous metastatic disease with largest deposit  replacing the L2 body where there is a compression fracture with  mild height loss. Also at this level is early epidural tumor  extension likely affecting both L2-3 foramina.  2. 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    05/27/2021 Imaging   MRI HEAD WITH AND WITHOUT CONTRAST: Negative for metastatic disease to the brain or calvarium.   06/03/2021 - 06/24/2021 Chemotherapy   Patient is on Treatment Plan : BREAST Trastuzumab q21d X 11 Cycles     06/25/2021 - 01/01/2022 Chemotherapy   Patient is on Treatment Plan : BREAST Docetaxel + Trastuzumab + Pertuzumab (THP) q21d     06/25/2021 - 01/02/2022 Chemotherapy   Patient is on Treatment Plan : BREAST Docetaxel + Trastuzumab + Pertuzumab (THP) q21d     09/14/2021 Imaging   CT chest/abdomen/pelvis  IMPRESSION:  1. Interval resolution of previously seen bulky left axillary and  subpectoral lymphadenopathy. No  persistently enlarged lymph nodes.  2. Multiple liver lesions are significantly diminished in size.  3. Widespread osseous metastatic disease, with an interval increase  in sclerosis of several previously lytic lesions.  4. Constellation of findings is consistent with treatment response  of metastatic disease  5. New, although age indeterminate pathologic wedge deformity of the  T6 vertebral body as well as an increased pathologic wedge deformity  of the L2 vertebral body.  6. Coronary artery disease.  Aortic Atherosclerosis (ICD10-I70.0).    01/26/2022 -  Chemotherapy   Patient is on Treatment Plan : BREAST METASTATIC Fam-Trastuzumab Deruxtecan-nxki (Enhertu) (5.4) q21d         REVIEW OF SYSTEMS:   Constitutional: Denies fevers, chills or abnormal weight loss Eyes: Denies blurriness of vision, she complains of excessive tearing Ears, nose, mouth, throat, and face: Denies mucositis or sore throat However, everything tastes bad to her Respiratory: Denies cough, dyspnea or wheezes Cardiovascular: Denies palpitation, chest discomfort or lower extremity swelling Gastrointestinal:  Denies nausea, heartburn or change in bowel habits Skin: Denies abnormal skin rashes Lymphatics: Denies new lymphadenopathy or easy bruising Neurological:She has severe paresthesias of the left upper extremity with inability to use her left hand Behavioral/Psych: Mood is stable, no new changes  Extremities: She has severe lymphedema of the left upper extremity All other systems were reviewed with the patient and are negative.   VITALS:  Blood pressure (!) 168/74, pulse 74, temperature 97.7 F (36.5 C), temperature source Oral, resp. rate 16, height 5' 0.25" (1.53 m), weight 109 lb (49.4 kg), SpO2 96 %.  Wt Readings from Last 3 Encounters:  03/30/22 109 lb 8 oz (49.7 kg)  03/25/22 109 lb (49.4 kg)  03/09/22 108 lb 4 oz (49.1 kg)    Body mass index is 21.11 kg/m.  Performance status (ECOG): 1 -  Symptomatic but completely ambulatory  PHYSICAL EXAM:   GENERAL:alert, no distress and comfortable SKIN: skin color, texture, turgor are normal, no rashes or significant lesions EYES: normal, Conjunctiva are pink and non-injected, sclera clear OROPHARYNX:no exudate, no erythema and lips, buccal mucosa, and tongue normal  NECK: supple, thyroid normal size, non-tender, without nodularity LYMPH:  no palpable lymphadenopathy in the cervical, axillary or inguinal  LUNGS: clear to auscultation and percussion with normal breathing effort CHEST: Left mastectomy is negative but she has a 2 cm nodule adjacent to the left shoulder, which is softer and slightly smaller. The nodularity of the left upper chest wall is improved HEART: regular rate & rhythm and no murmurs and no lower extremity edema ABDOMEN:abdomen soft, non-tender and normal bowel sounds Musculoskeletal:no cyanosis of digits and no clubbing  Extremities: She has severe lymphedema of the left upper extremity NEURO: alert & oriented x 3 with fluent speech, she has weakness of the left hand  LABORATORY DATA:  I have reviewed the data as listed    Component Value Date/Time   NA 141 03/25/2022 1434   NA 134 (A) 02/12/2022 0000   NA 137 11/13/2012 1057   K 4.0 03/25/2022 1434   K 4.5 11/13/2012 1057   CL 108 03/25/2022 1434   CO2 29 03/25/2022 1434   CO2 26 11/13/2012 1057   GLUCOSE 94 03/25/2022 1434   GLUCOSE 90 11/13/2012 1057   BUN 23 03/25/2022 1434   BUN 17 02/12/2022 0000   BUN 18.3 11/13/2012 1057   CREATININE 0.72 03/25/2022 1434   CREATININE 0.8 11/13/2012 1057   CALCIUM 9.4 03/25/2022 1434   CALCIUM 9.7 11/13/2012 1057   PROT 6.6 03/25/2022 1434   PROT 7.4 11/13/2012 1057   ALBUMIN 3.5 03/25/2022 1434   ALBUMIN 3.9 11/13/2012 1057   AST 31 03/25/2022 1434   AST 22 11/13/2012 1057   ALT 17 03/25/2022 1434   ALT 15 11/13/2012 1057   ALKPHOS 51 03/25/2022 1434   ALKPHOS 77 11/13/2012 1057   BILITOT 0.5 03/25/2022  1434   BILITOT 0.54 11/13/2012 1057   GFRNONAA >60 03/25/2022 1434   GFRAA >60 04/25/2019 1045    No results found for: "SPEP", "UPEP"  Lab Results  Component Value Date   WBC 4.6 03/25/2022   NEUTROABS 2.48 03/25/2022   HGB 13.0 03/25/2022   HCT 39 03/25/2022   MCV 101.6 (H) 03/05/2022   PLT 172 03/25/2022      Chemistry      Component Value Date/Time   NA 141 03/25/2022 1434   NA 134 (A) 02/12/2022 0000   NA 137 11/13/2012 1057   K 4.0 03/25/2022 1434   K 4.5 11/13/2012 1057   CL 108 03/25/2022 1434   CO2 29 03/25/2022 1434   CO2 26 11/13/2012 1057   BUN 23 03/25/2022 1434   BUN 17 02/12/2022 0000   BUN 18.3 11/13/2012 1057   CREATININE 0.72 03/25/2022 1434   CREATININE 0.8 11/13/2012 1057   GLU 106 02/12/2022 0000      Component Value Date/Time   CALCIUM 9.4 03/25/2022 1434   CALCIUM 9.7 11/13/2012 1057   ALKPHOS 51 03/25/2022 1434   ALKPHOS 77 11/13/2012 1057   AST 31 03/25/2022 1434   AST 22 11/13/2012 1057   ALT 17 03/25/2022 1434   ALT 15 11/13/2012 1057   BILITOT 0.5 03/25/2022 1434   BILITOT 0.54 11/13/2012 1057       RADIOGRAPHIC STUDIES: ECHOCARDIOGRAM COMPLETE  Result Date: 03/24/2022    ECHOCARDIOGRAM REPORT   Patient Name:   Jasmin Lewis Date of Exam: 03/24/2022 Medical Rec #:  [id number]         Height:       60.2 in Accession #:    [id number]        Weight:       108.2 lb Date of Birth:  1931/10/22  BSA:          1.442 m Patient Age:    41 years          BP:           126/54 mmHg Patient Gender: F                 HR:           79 bpm. Exam Location:  [facility] Procedure: 2D Echo, Cardiac Doppler, Color Doppler and Strain Analysis Indications:    Malignant neoplasm metastatic to liver ([place]) [C78.7                 (ICD-10-CM)]; Malignant neoplasm metastatic to bone ([place])                 [C79.51 (ICD-10-CM)]; Malignant neoplasm of upper-outer quadrant                 of left breast in female, estrogen receptor negative ([place])                  [C50.412, Z17.1 (ICD-10-CM)]; Other ill-defined heart diseases                 [I51.89 (ICD-10-CM)]  History:        Patient has prior history of Echocardiogram examinations, most                 recent 12/17/2021.  Sonographer:    Philipp Deputy RDCS Referring Phys: Mona  1. Sigmoid septum. GLS -16.0. Left ventricular ejection fraction, by estimation, is 60 to 65%. The left ventricle has normal function. The left ventricle has no regional wall motion abnormalities. Left ventricular diastolic parameters were normal.  2. Right ventricular systolic function is normal. The right ventricular size is normal. There is normal pulmonary artery systolic pressure.  3. The mitral valve is normal in structure. No evidence of mitral valve regurgitation. No evidence of mitral stenosis.  4. The aortic valve is calcified. There is mild calcification of the aortic valve. There is mild thickening of the aortic valve. Aortic valve regurgitation is mild. No aortic stenosis is present.  5. The inferior vena cava is normal in size with greater than 50% respiratory variability, suggesting right atrial pressure of 3 mmHg. FINDINGS  Left Ventricle: Sigmoid septum. GLS -16.0. Left ventricular ejection fraction, by estimation, is 60 to 65%. The left ventricle has normal function. The left ventricle has no regional wall motion abnormalities. The left ventricular internal cavity size was normal in size. There is no left ventricular hypertrophy. Left ventricular diastolic parameters were normal. Right Ventricle: The right ventricular size is normal. No increase in right ventricular wall thickness. Right ventricular systolic function is normal. There is normal pulmonary artery systolic pressure. The tricuspid regurgitant velocity is 2.56 m/s, and  with an assumed right atrial pressure of 3 mmHg, the estimated right ventricular systolic pressure is 82.9 mmHg. Left Atrium: Left atrial size was normal in size. Right  Atrium: Right atrial size was normal in size. Pericardium: There is no evidence of pericardial effusion. Mitral Valve: The mitral valve is normal in structure. No evidence of mitral valve regurgitation. No evidence of mitral valve stenosis. Tricuspid Valve: The tricuspid valve is normal in structure. Tricuspid valve regurgitation is mild . No evidence of tricuspid stenosis. Aortic Valve: The aortic valve is calcified. There is mild calcification of the aortic valve. There is mild thickening of the aortic valve. Aortic valve regurgitation is  mild. Aortic regurgitation PHT measures 571 msec. No aortic stenosis is present. Pulmonic Valve: The pulmonic valve was normal in structure. Pulmonic valve regurgitation is not visualized. No evidence of pulmonic stenosis. Aorta: The aortic root is normal in size and structure. Venous: The inferior vena cava is normal in size with greater than 50% respiratory variability, suggesting right atrial pressure of 3 mmHg. IAS/Shunts: No atrial level shunt detected by color flow Doppler.  LEFT VENTRICLE PLAX 2D LVIDd:         3.50 cm   Diastology LVIDs:         2.30 cm   LV e' medial:    7.83 cm/s LV PW:         1.00 cm   LV E/e' medial:  12.0 LV IVS:        1.20 cm   LV e' lateral:   9.36 cm/s LVOT diam:     1.80 cm   LV E/e' lateral: 10.1 LV SV:         59 LV SV Index:   41 LVOT Area:     2.54 cm  RIGHT VENTRICLE             IVC RV Basal diam:  2.70 cm     IVC diam: 1.20 cm RV S prime:     12.60 cm/s TAPSE (M-mode): 2.9 cm LEFT ATRIUM             Index        RIGHT ATRIUM           Index LA diam:        2.70 cm 1.87 cm/m   RA Area:     14.70 cm LA Vol (A2C):   31.1 ml 21.57 ml/m  RA Volume:   33.30 ml  23.09 ml/m LA Vol (A4C):   44.3 ml 30.72 ml/m LA Biplane Vol: 37.1 ml 25.73 ml/m  AORTIC VALVE LVOT Vmax:   114.00 cm/s LVOT Vmean:  72.300 cm/s LVOT VTI:    0.233 m AI PHT:      571 msec  AORTA Ao Root diam: 2.90 cm Ao Asc diam:  3.10 cm Ao Desc diam: 1.80 cm MITRAL VALVE                TRICUSPID VALVE MV Area (PHT): 4.39 cm    TR Peak grad:   26.2 mmHg MV Decel Time: 173 msec    TR Vmax:        256.00 cm/s MV E velocity: 94.30 cm/s MV A velocity: 80.80 cm/s  SHUNTS MV E/A ratio:  1.17        Systemic VTI:  0.23 m                            Systemic Diam: 1.80 cm Jenne Campus MD Electronically signed by Jenne Campus MD Signature Date/Time: 03/24/2022/12:31:22 PM    Final    EXAM:12/07/21 MRI OF THE LEFT HUMERUS WITHOUT AND WITH CONTRAST IMPRESSION: Changes of marrow reconversion without evidence of aggressive focal bone lesion in left humerus. Skin thickening and soft tissue swelling of the left upper extremity consistent with lymphedema in the setting of prior left axillary lymph node dissection.

## 2022-03-23 NOTE — Therapy (Signed)
[facility] [address], [place], [address] Phone: [phone]   Fax:  [phone]  Occupational Therapy Treatment  Patient Details  Name: Jasmin Lewis MRN: [id number] Date of Birth: Sep 04, 1931 Referring Provider (OT): Elliot Cousin   Encounter Date: 03/23/2022   OT End of Session - 03/23/22 1358     Visit Number 7    Number of Visits 12    Date for OT Re-Evaluation 04/22/22    OT Start Time 1401    OT Stop Time 1444    OT Time Calculation (min) 43 min    Activity Tolerance Patient tolerated treatment well    Behavior During Therapy [facility] for tasks assessed/performed             Past Medical History:  Diagnosis Date   Arthritis    shoulder   Breast cancer ([place])    Cancer ([place])    left breast ca   Colon cancer ([place])    [name], stage II 11/12/2011   Sigmoid lesion 5.9 cm  40 nodes negative but focus of cancer in a diverticum   Pre-op CEA 5.2 with lab normal up to 2.5 resected 09/21/05   Xeloda adjuvant chemotherapy   Hypertension     Past Surgical History:  Procedure Laterality Date   COLONOSCOPY     HEMICOLECTOMY  2007   MASTECTOMY WITH AXILLARY LYMPH NODE DISSECTION Left 03/14/2019   Procedure: LEFT MASTECTOMY WITH TARGETED LYMPH NODE DISSECTION;  Surgeon: Jovita Kussmaul, MD;  Location: [facility];  Service: General;  Laterality: Left;   SENTINEL NODE BIOPSY Left 03/14/2019   Procedure: Left Axillary Sentinel Lymph  Node Biopsy;  Surgeon: Jovita Kussmaul, MD;  Location: [facility];  Service: General;  Laterality: Left;   TONSILLECTOMY     WISDOM TOOTH EXTRACTION      There were no vitals filed for this visit.   Subjective Assessment - 03/23/22 1358     Subjective  I am going after this to [facility] to get fitted with my Circ aid compression- the fingers are just swollen and stiff since last night - some days better than others - my fingers are just numb today    Pertinent History  Malignant neoplasm of upper-outer quadrant of left breast in female, estrogen receptor negative ([place])  Stage IIB (T2c N1 M0) HER2 receptor positive left breast cancer, diagnosed in June 2020. This was ER/PR negative. This was treated with neoadjuvant Kadcyla/Perjeta and left mastectomy, with a complete response in breast and nodes. She had only received two doses of adjuvant Herceptin before being discontinued in December 2020.     History of colon cancer, stage II  History of stage II colon cancer, diagnosed in May 2007. This was treated with surgical resection and adjuvant chemotherapy Malignant neoplasm metastatic to liver [facility])  Liver metastases discovered 04/17/21.  Numerous heterogeneously enhancing, internally necrotic lesions in the right hepatic lobe, consistent with metastatic disease. Largest lesion measures up to 6.7 cm and there are at least 6 lesions. We now have biopsy proven HER2 positive recurrent breast cancer with ER/PR negative. She is receiving THP.CT scan now shows excellent response with significant decrease in the size of her liver lesions.     Malignant neoplasm metastatic to bone [facility])  Bone metastases discovered 04/17/21. Enhancing osseous lesions are present in the L2 vertebral body, both iliac bones, and in the sacrum, highly concerning for osseous metastases. She received her 1st dose of  zoledronic acid on January 16th.  I once again today explained to her and her son the reasoning for this medicine.  Her widespread bone metastases show an interval increase in sclerosis of previously lytic lesions consistent with treatment response.     Chest wall recurrence of breast cancer, left ([name])  Left supraclavicular lymph node measuring 2-3 cm. Also involvement of subpectoral node and chest wall skin. This was rapidly progressive despite receiving  trastuzumab.  We have added in Perjeta and Taxotere to her current regimen and this has been very effective.  The CT scan also shows good response of  her adenopathy with resolution of the previous bulky left axillary and subpectoral adenopathy. Today, I do not appreciate axillary nodes. However, she does have a 2 cm nodule noted to the left chest that is new. This is located close to the left shoulder. Of note, she does have existing lymphedema to the left arm; however, this is worse today, most likely from a bee sting she suffered to the left hand. Her arm appears red and is warm to touch. We will treat this with antibiotics.  Refer to lymphedema clinic.    Patient Stated Goals I just want to see if I can get the swelling better in my left arm and hand, in the motion, strength and use of my left dominant hand.    Currently in Pain? Yes   no number but with end range PROM digits               [facility] OT Assessment - 03/23/22 0001       Left Hand AROM   L Index  MCP 0-90 60 Degrees    L Index PIP 0-100 60 Degrees    L Long  MCP 0-90 70 Degrees    L Long PIP 0-100 60 Degrees    L Ring  MCP 0-90 70 Degrees    L Ring PIP 0-100 60 Degrees    L Little PIP 0-100 60 Degrees             LYMPHEDEMA/ONCOLOGY QUESTIONNAIRE - 03/23/22 0001       Left Upper Extremity Lymphedema   15 cm Proximal to Olecranon Process 25 cm    10 cm Proximal to Olecranon Process 25.5 cm    Olecranon Process 24 cm    15 cm Proximal to Ulnar Styloid Process 22.5 cm    10 cm Proximal to Ulnar Styloid Process 18.5 cm    Just Proximal to Ulnar Styloid Process 15.4 cm    Across Hand at [employer] 17.6 cm                     OT Treatments/Exercises (OP) - 03/23/22 0001       LUE Contrast Bath   Time 8 minutes    Comments to hand 2nd and 3rd heat rotation done flexion wrap of digits with short stretch              Pt arrive with no compression on left upper extremity.  Per son and patient patient had it off about 2 hours now.        Measurements taken for left upper extremity circumference compared to last time -doing very well with  maintaining it the last 3 sessions.  Upper arm did  decrease greatly.           Patient with great decongestion of L UE. -Allowing OT to be able to focus more on  range of motion and contrast /moist heat in session.  Done this date 3 times a light digit flexion compression wrap using a 8 cm short stretch. Patient kept on 2 minutes at a time with moist heat.   Great progress and grasping place and hold large foam and release - several times- provided for pt to use 5 x day             Done several reps and sets of passive range of motion to [facility] flexion as well as PIP flexion as well as composite flexion and extension of digits.  Done several times of reps and sets of placed on hold for composite fist.  Patient do fatigue easily. But showed increase reps and sets compare to in past    Encouraged patient and son to do range of motion home exercises every 2 hours to keep it flexible to be able to activate more active range of motion.   Patient did show this date also some wrist flexion against gravity active motion    HEP for AAROM and PROM o digits MC flexion, PIP /intrinsic fist  FOCUS ON THIS DATE, wrist flexion, ext, RD< UD , sup /pro ; elbow flexion, ext and shoulder flexion PROM - Pt can do in supine too   Pt fitted with Circaid sleeve - to use isotoner glove in combination -and loosen at night - tighten during day. Rep mail hand piece  Son to wash old glove and stretch digits and hand piece and bring in 2 wks      Patient to follow-up with me in 10 days to reassess        OT Education - 03/23/22 1358     Education Details progress and changes to HEP    Person(s) Educated Patient;Child(ren)    Methods Explanation;Demonstration;Tactile cues;Verbal cues;Handout    Comprehension Verbal cues required;Returned demonstration;Verbalized understanding                 OT Long Term Goals - 01/28/22 2117       OT LONG TERM GOAL #1   Title Patient and son demo understanding to  do home program to decrease circumference in left upper extremity to get measured for appropriate compression garment for home use.    Baseline No knowledge of bandaging and safely donning compression garment because of weakness in digits and wrist extension.  As well as flexion.    Time 4    Period Weeks    Status New    Target Date 02/25/22      OT LONG TERM GOAL #2   Title Left upper extremity circumference decreased by 2 cm in forearm and upper arm and 1 cm in hand and forearm to be fitted for appropriate compression garment to use safely at home to prevent infection.    Baseline No knowledge of home program to maintain circumference and prevent infection.    Time 6    Period Weeks    Status New    Target Date 03/11/22      OT LONG TERM GOAL #3   Title Patient show increase  L forearm pronation, wrist flexion and digits flexion in left arm to initiate use of left hand and ADLs.    Baseline Patient unable to use left hand and bathing and dressing or any ADLs unable to initiate digit flexion to make a fist or pinch.    Time 8    Period Weeks    Status New  Target Date 03/25/22                   Plan - 03/23/22 1359     Clinical Impression Statement Patient presented OT evaluation with a diagnosis of left upper extremity lymphedema.  Patient report lymphedema started about December 2022.  Was fitted with a compression sleeve.  But it got worse gradually with hand worsening after being stung by a wasp on the left hand few months after that.. Chest wall recurrence of breast cancer, left ([name])  Left supraclavicular lymph node measuring 2-3 cm. Also involvement of subpectoral node and chest wall skin. This was rapidly progressive despite receiving  trastuzumab.  They added in Perjeta and Taxotere to her current regimen and this has been very effective.  The CT scan also shows good response of her adenopathy with resolution of the previous bulky left axillary and subpectoral  adenopathy.  With 2 cm nodule noted to the left chest that is new. This is located close to the left shoulder.  Patient was treated with antibiotics after wasp sting.  Patient is left-hand dominant -last 2 visits had pt wear isotoner glove with Tubigrip D and E during the day.  At nighttime Isotoner glove and son bandaged with 1 Rosidal foam layer and short stretch 6 and 8 cm gradient. Pt arrive  the last 2-3 session with great progress in lymphedema circumference  and this date great progress in upper arm - pt got fitted with Circaid sleeve - Rep will mail hand piece - pt to wear isotoner glove  Done this date  contrast and wrap digits in flexion few times during session prior to  PROM and place and hold for [facility] and PIP flexion. SHowed increase PROM in all digits and joints -and increase place and hold ROM at [facility]  and PIP's - able to grasp large cylinder foam grip. Able to place and hold grip and release- provide pt to use at home. Pt report numbness in all digtis - see note for assessment.  Questioning reason for weakness in proximal muscles for pronation as well as wrist flexion. Pt and son encourage to do digits flexion stretches and ROM 5 x day.  Patient can benefit from skilled OT services to decrease circumference of left upper extremity, wearing of appropriate compression and possibly increased range of motion/strength to increase functional use of left dominant hand for quality of life performing ADLs.    OT Occupational Profile and History Problem Focused Assessment - Including review of records relating to presenting problem    Occupational performance deficits (Please refer to evaluation for details): ADL's;IADL's;Play;Leisure;Social Participation    Body Structure / Function / Physical Skills ADL;Coordination;Sensation;Flexibility;IADL;ROM;Edema;Dexterity;Strength;Decreased knowledge of precautions    Rehab Potential Fair    Clinical Decision Making Several treatment options, min-mod task  modification necessary    Comorbidities Affecting Occupational Performance: May have comorbidities impacting occupational performance    Modification or Assistance to Complete Evaluation  Min-Moderate modification of tasks or assist with assess necessary to complete eval    OT Frequency 1x / week    OT Duration 12 weeks    OT Treatment/Interventions Self-care/ADL training;Manual lymph drainage;Compression bandaging;Therapeutic exercise;Contrast Bath;Manual Therapy;Patient/family education;Passive range of motion    Consulted and Agree with Plan of Care Patient;Family member/caregiver             Patient will benefit from skilled therapeutic intervention in order to improve the following deficits and impairments:   Body Structure / Function / Physical Skills:  ADL, Coordination, Sensation, Flexibility, IADL, ROM, Edema, Dexterity, Strength, Decreased knowledge of precautions       Visit Diagnosis: Postmastectomy lymphedema syndrome  Stiffness of left hand, not elsewhere classified  Muscle weakness (generalized)    Problem List Patient Active Problem List   Diagnosis Date Noted   Lymphedema of left upper extremity 02/05/2022    Class: Chronic   Secondary malignant neoplasm of bone and bone marrow ([place]) 09/07/2021   Chest wall recurrence of breast cancer, left ([place]) 06/24/2021    Class: Diagnosis of   Dehydration 05/25/2021   Malignant neoplasm metastatic to liver ([place]) 05/15/2021    Class: Diagnosis of   Malignant neoplasm metastatic to bone ([place]) 05/15/2021    Class: Acute   Hypercalcemia 05/15/2021    Class: Acute   Brachial plexus injury, left 04/27/2021    Class: Chronic   Malignant neoplasm of upper-outer quadrant of left breast in female, estrogen receptor negative ([place]) 11/02/2018   Colon polyps 05/19/2018    Class: History of   History of colon cancer, stage II 11/12/2011    Rosalyn Gess, OTR/L,CLT 03/23/2022, 8:04 PM  [facility] [address] [place], [address] Phone: [phone]   Fax:  [phone]  Name: Jasmin Lewis MRN: [id number] Date of Birth: 06-06-1931

## 2022-03-24 ENCOUNTER — Other Ambulatory Visit: Payer: Self-pay

## 2022-03-24 ENCOUNTER — Ambulatory Visit: Payer: Medicare Other | Attending: Cardiology

## 2022-03-24 DIAGNOSIS — C787 Secondary malignant neoplasm of liver and intrahepatic bile duct: Secondary | ICD-10-CM | POA: Diagnosis present

## 2022-03-24 DIAGNOSIS — C50412 Malignant neoplasm of upper-outer quadrant of left female breast: Secondary | ICD-10-CM | POA: Diagnosis present

## 2022-03-24 DIAGNOSIS — Z171 Estrogen receptor negative status [ER-]: Secondary | ICD-10-CM | POA: Insufficient documentation

## 2022-03-24 DIAGNOSIS — I5189 Other ill-defined heart diseases: Secondary | ICD-10-CM | POA: Insufficient documentation

## 2022-03-24 DIAGNOSIS — C7951 Secondary malignant neoplasm of bone: Secondary | ICD-10-CM | POA: Diagnosis present

## 2022-03-24 LAB — ECHOCARDIOGRAM COMPLETE
Area-P 1/2: 4.39 cm2
P 1/2 time: 571 msec
S' Lateral: 2.3 cm

## 2022-03-25 ENCOUNTER — Encounter: Payer: Self-pay | Admitting: Oncology

## 2022-03-25 ENCOUNTER — Inpatient Hospital Stay: Payer: Medicare Other

## 2022-03-25 ENCOUNTER — Inpatient Hospital Stay: Payer: Medicare Other | Attending: Oncology | Admitting: Oncology

## 2022-03-25 DIAGNOSIS — I890 Lymphedema, not elsewhere classified: Secondary | ICD-10-CM | POA: Insufficient documentation

## 2022-03-25 DIAGNOSIS — Z5112 Encounter for antineoplastic immunotherapy: Secondary | ICD-10-CM | POA: Diagnosis not present

## 2022-03-25 DIAGNOSIS — C778 Secondary and unspecified malignant neoplasm of lymph nodes of multiple regions: Secondary | ICD-10-CM | POA: Diagnosis not present

## 2022-03-25 DIAGNOSIS — Z171 Estrogen receptor negative status [ER-]: Secondary | ICD-10-CM

## 2022-03-25 DIAGNOSIS — C787 Secondary malignant neoplasm of liver and intrahepatic bile duct: Secondary | ICD-10-CM | POA: Diagnosis not present

## 2022-03-25 DIAGNOSIS — C7951 Secondary malignant neoplasm of bone: Secondary | ICD-10-CM

## 2022-03-25 DIAGNOSIS — Z9012 Acquired absence of left breast and nipple: Secondary | ICD-10-CM | POA: Insufficient documentation

## 2022-03-25 DIAGNOSIS — Z85038 Personal history of other malignant neoplasm of large intestine: Secondary | ICD-10-CM | POA: Insufficient documentation

## 2022-03-25 DIAGNOSIS — C50412 Malignant neoplasm of upper-outer quadrant of left female breast: Secondary | ICD-10-CM | POA: Insufficient documentation

## 2022-03-25 LAB — CMP (CANCER CENTER ONLY)
ALT: 17 U/L (ref 0–44)
AST: 31 U/L (ref 15–41)
Albumin: 3.5 g/dL (ref 3.5–5.0)
Alkaline Phosphatase: 51 U/L (ref 38–126)
Anion gap: 4 — ABNORMAL LOW (ref 5–15)
BUN: 23 mg/dL (ref 8–23)
CO2: 29 mmol/L (ref 22–32)
Calcium: 9.4 mg/dL (ref 8.9–10.3)
Chloride: 108 mmol/L (ref 98–111)
Creatinine: 0.72 mg/dL (ref 0.44–1.00)
GFR, Estimated: >60 mL/min (ref >60)
Glucose, Bld: 94 mg/dL (ref 70–99)
Potassium: 4 mmol/L (ref 3.5–5.1)
Sodium: 141 mmol/L (ref 135–145)
Total Bilirubin: 0.5 mg/dL (ref 0.3–1.2)
Total Protein: 6.6 g/dL (ref 6.5–8.1)

## 2022-03-25 LAB — CBC AND DIFFERENTIAL
HCT: 39 (ref 36–46)
Hemoglobin: 13 (ref 12.0–16.0)
Neutrophils Absolute: 2.48
Platelets: 172 10*3/uL (ref 150–400)
WBC: 4.6

## 2022-03-25 LAB — CBC: RBC: 3.84 — AB (ref 3.87–5.11)

## 2022-03-27 ENCOUNTER — Other Ambulatory Visit: Payer: Self-pay

## 2022-03-29 MED FILL — Dexamethasone Sodium Phosphate Inj 100 MG/10ML: INTRAMUSCULAR | Qty: 1 | Status: AC

## 2022-03-29 MED FILL — Fam-Trastuzumab Deruxtecan-nxki For IV Soln 100 MG: INTRAVENOUS | Qty: 10 | Status: AC

## 2022-03-29 MED FILL — Zoledronic Acid Inj Conc For IV Infusion 4 MG/5ML: INTRAVENOUS | Qty: 3.75 | Status: AC

## 2022-03-30 ENCOUNTER — Inpatient Hospital Stay: Payer: Medicare Other

## 2022-03-30 VITALS — BP 138/54 | HR 81 | Temp 98.2°F | Resp 20 | Ht 60.25 in | Wt 109.5 lb

## 2022-03-30 DIAGNOSIS — Z5112 Encounter for antineoplastic immunotherapy: Secondary | ICD-10-CM | POA: Diagnosis not present

## 2022-03-30 DIAGNOSIS — C50412 Malignant neoplasm of upper-outer quadrant of left female breast: Secondary | ICD-10-CM

## 2022-03-30 DIAGNOSIS — C787 Secondary malignant neoplasm of liver and intrahepatic bile duct: Secondary | ICD-10-CM

## 2022-03-30 DIAGNOSIS — C7951 Secondary malignant neoplasm of bone: Secondary | ICD-10-CM

## 2022-03-30 MED ORDER — SODIUM CHLORIDE 0.9% FLUSH
10.0000 mL | INTRAVENOUS | Status: DC | PRN
  Administered 2022-03-30: 10 mL

## 2022-03-30 MED ORDER — ACETAMINOPHEN 325 MG PO TABS
650.0000 mg | ORAL_TABLET | Freq: Once | ORAL | Status: AC
  Administered 2022-03-30: 650 mg via ORAL
  Filled 2022-03-30: qty 2

## 2022-03-30 MED ORDER — HEPARIN SOD (PORK) LOCK FLUSH 100 UNIT/ML IV SOLN
500.0000 [IU] | Freq: Once | INTRAVENOUS | Status: AC | PRN
  Administered 2022-03-30: 500 [IU]

## 2022-03-30 MED ORDER — ZOLEDRONIC ACID 4 MG/5ML IV CONC
3.0000 mg | Freq: Once | INTRAVENOUS | Status: AC
  Administered 2022-03-30: 3 mg via INTRAVENOUS
  Filled 2022-03-30: qty 3.75

## 2022-03-30 MED ORDER — DIPHENHYDRAMINE HCL 25 MG PO CAPS
50.0000 mg | ORAL_CAPSULE | Freq: Once | ORAL | Status: AC
  Administered 2022-03-30: 50 mg via ORAL
  Filled 2022-03-30: qty 2

## 2022-03-30 MED ORDER — SODIUM CHLORIDE 0.9 % IV SOLN
10.0000 mg | Freq: Once | INTRAVENOUS | Status: AC
  Administered 2022-03-30: 10 mg via INTRAVENOUS
  Filled 2022-03-30: qty 10

## 2022-03-30 MED ORDER — FAM-TRASTUZUMAB DERUXTECAN-NXKI CHEMO 100 MG IV SOLR
3.9800 mg/kg | Freq: Once | INTRAVENOUS | Status: AC
  Administered 2022-03-30: 200 mg via INTRAVENOUS
  Filled 2022-03-30: qty 10

## 2022-03-30 MED ORDER — DEXTROSE 5 % IV SOLN: Freq: Once | INTRAVENOUS | Status: AC

## 2022-03-30 MED ORDER — PALONOSETRON HCL INJECTION 0.25 MG/5ML
0.2500 mg | Freq: Once | INTRAVENOUS | Status: AC
  Administered 2022-03-30: 0.25 mg via INTRAVENOUS
  Filled 2022-03-30: qty 5

## 2022-03-30 NOTE — Patient Instructions (Signed)
Fam-Trastuzumab Deruxtecan Injection What is this medication? FAM-TRASTUZUMAB DERUXTECAN (fam-tras TOOZ eu mab DER ux TEE kan) treats some types of cancer. It works by blocking a protein that causes cancer cells to grow and multiply. This helps to slow or stop the spread of cancer cells. This medicine may be used for other purposes; ask your health care provider or pharmacist if you have questions. COMMON BRAND NAME(S): ENHERTU What should I tell my care team before I take this medication? They need to know if you have any of these conditions: Heart disease Heart failure Infection, especially a viral infection, such as chickenpox, cold sores, or herpes Liver disease Lung or breathing disease, such as asthma or COPD An unusual or allergic reaction to fam-trastuzumab deruxtecan, other medications, foods, dyes, or preservatives Pregnant or trying to get pregnant Breast-feeding How should I use this medication? This medication is injected into a vein. It is given by your care team in a hospital or clinic setting. A special MedGuide will be given to you before each treatment. Be sure to read this information carefully each time. Talk to your care team about the use of this medication in children. Special care may be needed. Overdosage: If you think you have taken too much of this medicine contact a poison control center or emergency room at once. NOTE: This medicine is only for you. Do not share this medicine with others. What if I miss a dose? It is important not to miss your dose. Call your care team if you are unable to keep an appointment. What may interact with this medication? Interactions are not expected. This list may not describe all possible interactions. Give your health care provider a list of all the medicines, herbs, non-prescription drugs, or dietary supplements you use. Also tell them if you smoke, drink alcohol, or use illegal drugs. Some items may interact with your  medicine. What should I watch for while using this medication? Visit your care team for regular checks on your progress. Tell your care team if your symptoms do not start to get better or if they get worse. Your condition will be monitored carefully while you are receiving this medication. Do not become pregnant while taking this medication or for 7 months after stopping it. Women should inform their care team if they wish to become pregnant or think they might be pregnant. Men should not father a child while taking this medication and for 4 months after stopping it. There is potential for serious side effects to an unborn child. Talk to your care team for more information. Do not breast-feed an infant while taking this medication or for 7 months after the last dose. This medication has caused decreased sperm counts in some men. This may make it more difficult to father a child. Talk to your care team if you are concerned about your fertility. This medication may increase your risk to bruise or bleed. Call your care team if you notice any unusual bleeding. Be careful brushing or flossing your teeth or using a toothpick because you may get an infection or bleed more easily. If you have any dental work done, tell your dentist you are receiving this medication. This medication may cause dry eyes and blurred vision. If you wear contact lenses, you may feel some discomfort. Lubricating eye drops may help. See your care team if the problem does not go away or is severe. This medication may increase your risk of getting an infection. Call your care team for   advice if you get a fever, chills, sore throat, or other symptoms of a cold or flu. Do not treat yourself. Try to avoid being around people who are sick. Avoid taking medications that contain aspirin, acetaminophen, ibuprofen, naproxen, or ketoprofen unless instructed by your care team. These medications may hide a fever. What side effects may I notice from  receiving this medication? Side effects that you should report to your care team as soon as possible: Allergic reactions--skin rash, itching, hives, swelling of the face, lips, tongue, or throat Dry cough, shortness of breath or trouble breathing Infection--fever, chills, cough, sore throat, wounds that don't heal, pain or trouble when passing urine, general feeling of discomfort or being unwell Heart failure--shortness of breath, swelling of the ankles, feet, or hands, sudden weight gain, unusual weakness or fatigue Unusual bruising or bleeding Side effects that usually do not require medical attention (report these to your care team if they continue or are bothersome): Constipation Diarrhea Hair loss Muscle pain Nausea Vomiting This list may not describe all possible side effects. Call your doctor for medical advice about side effects. You may report side effects to FDA at 1-800-FDA-1088. Where should I keep my medication? This medication is given in a hospital or clinic. It will not be stored at home. NOTE: This sheet is a summary. It may not cover all possible information. If you have questions about this medicine, talk to your doctor, pharmacist, or health care provider.  2023 Elsevier/Gold Standard (2021-01-07 00:00:00) 

## 2022-03-30 NOTE — Progress Notes (Signed)
Declines Flu vaccine

## 2022-04-06 ENCOUNTER — Ambulatory Visit: Payer: Medicare Other | Admitting: Occupational Therapy

## 2022-04-06 DIAGNOSIS — I972 Postmastectomy lymphedema syndrome: Secondary | ICD-10-CM

## 2022-04-06 DIAGNOSIS — M25642 Stiffness of left hand, not elsewhere classified: Secondary | ICD-10-CM

## 2022-04-06 DIAGNOSIS — M6281 Muscle weakness (generalized): Secondary | ICD-10-CM

## 2022-04-06 NOTE — Therapy (Signed)
[facility] [address], [place], [address] Phone: [phone]   Fax:  [phone]  Occupational Therapy Treatment  Patient Details  Name: Jasmin Lewis MRN: [id number] Date of Birth: 01/01/1932 Referring Provider (OT): Elliot Cousin   Encounter Date: 04/06/2022   OT End of Session - 04/06/22 1918     Visit Number 8    Number of Visits 12    Date for OT Re-Evaluation 04/22/22    OT Start Time 1315    OT Stop Time 1410    OT Time Calculation (min) 55 min    Activity Tolerance Patient tolerated treatment well    Behavior During Therapy [facility] for tasks assessed/performed             Past Medical History:  Diagnosis Date   Arthritis    shoulder   Breast cancer ([place])    Cancer ([place])    left breast ca   Colon cancer ([place])    [name], stage II 11/12/2011   Sigmoid lesion 5.9 cm  40 nodes negative but focus of cancer in a diverticum   Pre-op CEA 5.2 with lab normal up to 2.5 resected 09/21/05   Xeloda adjuvant chemotherapy   Hypertension     Past Surgical History:  Procedure Laterality Date   COLONOSCOPY     HEMICOLECTOMY  2007   MASTECTOMY WITH AXILLARY LYMPH NODE DISSECTION Left 03/14/2019   Procedure: LEFT MASTECTOMY WITH TARGETED LYMPH NODE DISSECTION;  Surgeon: Jovita Kussmaul, MD;  Location: [place];  Service: General;  Laterality: Left;   SENTINEL NODE BIOPSY Left 03/14/2019   Procedure: Left Axillary Sentinel Lymph  Node Biopsy;  Surgeon: Jovita Kussmaul, MD;  Location: [place];  Service: General;  Laterality: Left;   TONSILLECTOMY     WISDOM TOOTH EXTRACTION      There were no vitals filed for this visit.   Subjective Assessment - 04/06/22 1328     Subjective  DOing okay -I can do the compression sleeve myself -taking off and on- but hand and fingers just cannot get the motion - still numb    Pertinent History Malignant neoplasm of upper-outer quadrant of left breast in female,  estrogen receptor negative ([name])  Stage IIB (T2c N1 M0) HER2 receptor positive left breast cancer, diagnosed in June 2020. This was ER/PR negative. This was treated with neoadjuvant Kadcyla/Perjeta and left mastectomy, with a complete response in breast and nodes. She had only received two doses of adjuvant Herceptin before being discontinued in December 2020.     History of colon cancer, stage II  History of stage II colon cancer, diagnosed in May 2007. This was treated with surgical resection and adjuvant chemotherapy Malignant neoplasm metastatic to liver [facility])  Liver metastases discovered 04/17/21.  Numerous heterogeneously enhancing, internally necrotic lesions in the right hepatic lobe, consistent with metastatic disease. Largest lesion measures up to 6.7 cm and there are at least 6 lesions. We now have biopsy proven HER2 positive recurrent breast cancer with ER/PR negative. She is receiving THP.CT scan now shows excellent response with significant decrease in the size of her liver lesions.     Malignant neoplasm metastatic to bone [facility])  Bone metastases discovered 04/17/21. Enhancing osseous lesions are present in the L2 vertebral body, both iliac bones, and in the sacrum, highly concerning for osseous metastases. She received her 1st dose of zoledronic acid on January 16th.  I once again today explained to her  and her son the reasoning for this medicine.  Her widespread bone metastases show an interval increase in sclerosis of previously lytic lesions consistent with treatment response.     Chest wall recurrence of breast cancer, left ([name])  Left supraclavicular lymph node measuring 2-3 cm. Also involvement of subpectoral node and chest wall skin. This was rapidly progressive despite receiving  trastuzumab.  We have added in Perjeta and Taxotere to her current regimen and this has been very effective.  The CT scan also shows good response of her adenopathy with resolution of the previous bulky left axillary  and subpectoral adenopathy. Today, I do not appreciate axillary nodes. However, she does have a 2 cm nodule noted to the left chest that is new. This is located close to the left shoulder. Of note, she does have existing lymphedema to the left arm; however, this is worse today, most likely from a bee sting she suffered to the left hand. Her arm appears red and is warm to touch. We will treat this with antibiotics.  Refer to lymphedema clinic.    Patient Stated Goals I just want to see if I can get the swelling better in my left arm and hand, in the motion, strength and use of my left dominant hand.    Currently in Pain? No/denies                [facility] OT Assessment - 04/06/22 0001       Left Hand AROM   L Index  MCP 0-90 60 Degrees    L Index PIP 0-100 60 Degrees    L Long  MCP 0-90 70 Degrees    L Long PIP 0-100 60 Degrees    L Ring  MCP 0-90 70 Degrees    L Ring PIP 0-100 60 Degrees    L Little PIP 0-100 60 Degrees    L Little DIP 0-70 40 Degrees             LYMPHEDEMA/ONCOLOGY QUESTIONNAIRE - 04/06/22 0001       Left Upper Extremity Lymphedema   15 cm Proximal to Olecranon Process 27 cm    10 cm Proximal to Olecranon Process 25.5 cm    Olecranon Process 22.5 cm    15 cm Proximal to Ulnar Styloid Process 21 cm    10 cm Proximal to Ulnar Styloid Process 17.5 cm    Just Proximal to Ulnar Styloid Process 15 cm    Across Hand at [employer] 17.1 cm            Pt arrive with  Circaid sleeve  on L UE- isotoner glove in combination -and loosen at night - tighten during day. Rep mailing hand piece        Measurements taken for left upper extremity circumference compared to last time -doing very well with maintaining with using compression          Patient with great decongestion of L UE. -Allowing OT to be able to focus more on range of motion and contrast /moist heat in session.  Done this date 3 times a light digit flexion compression wrap using a 8 cm short  stretch. Patient kept on 2 minutes at a time with moist heat.   Great progress and grasping place and hold large foam and release - several times- provided for pt to use 5 x day     Pt cont to have numbness in all digits -with 3rd worse and med side  of 4th - CTS? And other nerve involve with decrease wrist strength and pronation limited  But unable to maintain progress with PROM -and use  Recommend Neurology or Ortho consult for nerve conduction test at this time           Done several reps and sets of passive range of motion to [facility] flexion as well as PIP flexion as well as composite flexion and extension of digits.  Done several times of reps and sets of placed on hold for composite fist.  Patient do fatigue easily. But showed increase reps and sets compare to in past      Encouraged patient and son to do range of motion home exercises 3-5 x day to keep it flexible to be able to activate more active range of motion.      HEP for AAROM and PROM o digits MC flexion, PIP /intrinsic fist  and composite flexion- PROM and place and hold  See flowsheet  AROM in shoulder and elbow WNL       Patient to follow-up with me in 2-3 wks to  reassess                 OT Education - 04/06/22 1917     Education Details progress and changes to [employer]) Educated Patient;Child(ren)    Methods Explanation;Demonstration;Tactile cues;Verbal cues;Handout    Comprehension Verbal cues required;Returned demonstration;Verbalized understanding                 OT Long Term Goals - 01/28/22 2117       OT LONG TERM GOAL #1   Title Patient and son demo understanding to do home program to decrease circumference in left upper extremity to get measured for appropriate compression garment for home use.    Baseline No knowledge of bandaging and safely donning compression garment because of weakness in digits and wrist extension.  As well as flexion.    Time 4    Period Weeks    Status  New    Target Date 02/25/22      OT LONG TERM GOAL #2   Title Left upper extremity circumference decreased by 2 cm in forearm and upper arm and 1 cm in hand and forearm to be fitted for appropriate compression garment to use safely at home to prevent infection.    Baseline No knowledge of home program to maintain circumference and prevent infection.    Time 6    Period Weeks    Status New    Target Date 03/11/22      OT LONG TERM GOAL #3   Title Patient show increase  L forearm pronation, wrist flexion and digits flexion in left arm to initiate use of left hand and ADLs.    Baseline Patient unable to use left hand and bathing and dressing or any ADLs unable to initiate digit flexion to make a fist or pinch.    Time 8    Period Weeks    Status New    Target Date 03/25/22                   Plan - 04/06/22 1918     Clinical Impression Statement Patient presented OT evaluation with a diagnosis of left upper extremity lymphedema.  Patient report lymphedema started about December 2022.  Was fitted with a compression sleeve.  But it got worse gradually with hand worsening after being stung by a wasp on the left hand  few months after that.. Chest wall recurrence of breast cancer, left ([name])  Left supraclavicular lymph node measuring 2-3 cm. Also involvement of subpectoral node and chest wall skin. This was rapidly progressive despite receiving  trastuzumab.  They added in Perjeta and Taxotere to her current regimen and this has been very effective.  The CT scan also shows good response of her adenopathy with resolution of the previous bulky left axillary and subpectoral adenopathy.  With 2 cm nodule noted to the left chest that is new. This is located close to the left shoulder.  Patient was treated with antibiotics after wasp sting.  Patient is left-hand dominant -Pt made great progress in lymphdema management in L UE - and fitted last time with Circaid compression that she can manage  herself day and night time but cont to have stiffness and numbness in L hand , wrist and pronation. Appear CTS but also wrist and pronation weakness- recommend for pt to look into gettting nerve Conduction test because OT and her is able to do PROM to digits -and get place and hold 60-70 degrees of flexion -but cannot maintain. Numbness in all digits with 3rd and 4th worse. Pt to cont with Circaid sleeve - Rep will mail hand piece - pt to wear isotoner glove.. Pt cont to show progress in PROM in all digits and joints now that lymphedema in managed- able to grasp large cylinder foam grip. Able to place and hold grip and release- provide pt to use at home last time. Pt and son encourage to do digits flexion stretches and ROM 5 x day.  Patient  to follow up with me in 2-3 wks to assess if can maintain progress and if was able to get schedule for nerve conduction test. Pt can benefit from skilled OT services for lymphedema management and increased range of motion/strength if possible to increase functional use of left dominant hand for quality of life performing ADLs.    OT Occupational Profile and History Problem Focused Assessment - Including review of records relating to presenting problem    Occupational performance deficits (Please refer to evaluation for details): ADL's;IADL's;Play;Leisure;Social Participation    Body Structure / Function / Physical Skills ADL;Coordination;Sensation;Flexibility;IADL;ROM;Edema;Dexterity;Strength;Decreased knowledge of precautions    Rehab Potential Fair    Clinical Decision Making Several treatment options, min-mod task modification necessary    Comorbidities Affecting Occupational Performance: May have comorbidities impacting occupational performance    Modification or Assistance to Complete Evaluation  Min-Moderate modification of tasks or assist with assess necessary to complete eval    OT Frequency Biweekly    OT Duration 6 weeks    OT Treatment/Interventions  Self-care/ADL training;Manual lymph drainage;Compression bandaging;Therapeutic exercise;Contrast Bath;Manual Therapy;Patient/family education;Passive range of motion    Consulted and Agree with Plan of Care Patient;Family member/caregiver             Patient will benefit from skilled therapeutic intervention in order to improve the following deficits and impairments:   Body Structure / Function / Physical Skills: ADL, Coordination, Sensation, Flexibility, IADL, ROM, Edema, Dexterity, Strength, Decreased knowledge of precautions       Visit Diagnosis: Postmastectomy lymphedema syndrome  Stiffness of left hand, not elsewhere classified  Muscle weakness (generalized)    Problem List Patient Active Problem List   Diagnosis Date Noted   Lymphedema of left upper extremity 02/05/2022    Class: Chronic   Secondary malignant neoplasm of bone and bone marrow ([place]) 09/07/2021   Chest wall recurrence of breast cancer, left (  [place]) 06/24/2021    Class: Diagnosis of   Dehydration 05/25/2021   Malignant neoplasm metastatic to liver ([place]) 05/15/2021    Class: Diagnosis of   Malignant neoplasm metastatic to bone ([place]) 05/15/2021    Class: Acute   Hypercalcemia 05/15/2021    Class: Acute   Brachial plexus injury, left 04/27/2021    Class: Chronic   Malignant neoplasm of upper-outer quadrant of left breast in female, estrogen receptor negative ([place]) 11/02/2018   Colon polyps 05/19/2018    Class: History of   History of colon cancer, stage II 11/12/2011    Rosalyn Gess, OTR/L,CLT 04/06/2022, 7:25 PM  [facility] [address] [place], [address] Phone: [phone]   Fax:  [phone]  Name: ASEES MANFREDI MRN: [id number] Date of Birth: 20-Jan-1932

## 2022-04-07 ENCOUNTER — Other Ambulatory Visit: Payer: Self-pay

## 2022-04-11 ENCOUNTER — Encounter: Payer: Self-pay | Admitting: Oncology

## 2022-04-11 ENCOUNTER — Other Ambulatory Visit: Payer: Self-pay | Admitting: Oncology

## 2022-04-11 DIAGNOSIS — Z171 Estrogen receptor negative status [ER-]: Secondary | ICD-10-CM

## 2022-04-11 DIAGNOSIS — C7951 Secondary malignant neoplasm of bone: Secondary | ICD-10-CM

## 2022-04-11 DIAGNOSIS — C787 Secondary malignant neoplasm of liver and intrahepatic bile duct: Secondary | ICD-10-CM

## 2022-04-13 NOTE — Progress Notes (Signed)
Patient Care Team: [name], MD as PCP - General (Family Medicine) [name], MD as Consulting Physician (Oncology)  Clinic Day: 04/15/22   Referring physician: [name], MD  ASSESSMENT & PLAN:   Assessment & Plan: Malignant neoplasm of upper-outer quadrant of left breast in female, estrogen receptor negative ([place]) Stage IIB (T2c N1 M0) HER2 receptor positive left breast cancer, diagnosed in June 2020. This was ER/PR negative. This was treated with neoadjuvant Kadcyla/Perjeta and left mastectomy, with a complete response in breast and nodes. She had only received two doses of adjuvant Herceptin before being discontinued in December 2020.   History of colon cancer, stage II History of stage II colon cancer, diagnosed in May 2007. This was treated with surgical resection and adjuvant chemotherapy with Xeloda. Colonoscopy from 2013 revealed 2 polyps which were resected and chronic diverticulosis.   Malignant neoplasm metastatic to liver [facility]) Liver metastases discovered 04/17/21.  Numerous heterogeneously enhancing, internally necrotic lesions in the right hepatic lobe, consistent with metastatic disease. Largest lesion measures up to 6.7 cm and there are at least 6 lesions. We now have biopsy proven HER2 positive recurrent breast cancer with ER/PR negative. She received THP and tolerated it without significant difficulty.  CT scan showed excellent response with significant decrease in the size of her liver lesions.   Malignant neoplasm metastatic to bone [facility]) Bone metastases discovered 04/17/21. Enhancing osseous lesions are present in the L2 vertebral body, both iliac bones, and in the sacrum, highly concerning for osseous metastases. She received her 1st dose of zoledronic acid on January 16th.  I once again today explained to her and her son the reasoning for this medicine.  Her widespread bone metastases show an interval increase in sclerosis of previously lytic lesions  consistent with treatment response.   Chest wall recurrence of breast cancer, left ([name]) Left supraclavicular lymph node measuring 2-3 cm. Also involvement of subpectoral node and chest wall skin. This was rapidly progressive despite receiving  trastuzumab.  We have added in Perjeta and Taxotere to her current regimen and this has been very effective.  The CT scan also shows good response of her adenopathy with resolution of the previous bulky left axillary and subpectoral adenopathy. In September of 2023, she had a 2 cm nodule  located close to the left shoulder associated with nodularity of the left upper chest wall and appeared to have obvious progression of disease again. She was changed to [facility] and is responding well.  Lymphedema She does have existing lymphedema to the left arm; however, this is better today, with significant edema of the hand as well. We will refer her to a lymphedema specialist and she requests one in [place], so we will arrange that. She sleeps with the hand and arm elevated.  She has decreased use of her left hand but I think this is due to more than just the lymphedema.  Plan She had definite progression of disease in September, evident on the left upper chest wall again, and so we switched her treatment to Enhertu. She is clearly responding already. She no longer has an =y visible or palpable disease. She has seen the lymphedema specialist and is well pleased with her recommendations.  They are now requesting a referral to the [place] clinic at [facility] for nerve conduction evaluation by Dr. Rudene Christians and Dr. Roland Rack, so I will make that referral.  I will see her back in 3 weeks with CBC and CMP for  her 6th  cycle of Enhertu. We plan restaging scans in January after she has had 6 cycles.  The patient and her son understand the plans discussed today and are in agreement with them.  She knows to contact our office if she develops concerns prior to her next  appointment.     [name], MD  [place] [address] [place] [address] Dept: [phone] Dept Fax: [phone]   [name].     CHIEF COMPLAINT:  CC: An 88 year old female with metastatic breast cancer   Current Treatment:  Planning Enhertu  INTERVAL HISTORY:  Jasmin Lewis is here today for repeat clinical assessment prior to her 5th cycle of Enhertu on 04/20/22. She is responding very well to the Enhertu and no longer has visible disease of her lleft chest wall. Her infusions are on Tuesdays. She claims that she is handling chemotherapy ok however, she is still a little weak and her eyes tear a lot. Patient states that she is waiting on nerve conduction test for her hand and would appreciate a referral. She requests a referral to the [facility] center at [facility] to see Dr. Rudene Christians or Dr. Roland Rack, I will make that referral for nerve conduction test and further evaluation. She states she also has had constipation and may be because of medications she is taking.  She has lymphedema of the left arm which has improved since the last visit. Her son wants to know more information on her taking vitamin D. I will check a vitamin D level with the patient. Patients labs currently look good. She denies fevers or chills. She denies pain. Her appetite is good. Her weight has increased 1 pound in the past 2 months.   I have reviewed the past medical history, past surgical history, social history and family history with the patient and they are unchanged from previous note.  ALLERGIES:  has No Known Allergies.  MEDICATIONS:  Current Outpatient Medications  Medication Sig Dispense Refill   fish oil-omega-3 fatty acids 1000 MG capsule Take 1 g by mouth daily.     gabapentin (NEURONTIN) 100 MG capsule Take 1 capsule (100 mg total) by mouth at bedtime. (Patient not taking:  Reported on 03/05/2022) 30 capsule 5   magnesium citrate SOLN Take 0.5 Bottles by mouth as needed for severe constipation.     ondansetron (ZOFRAN) 4 MG tablet Take 1 tablet (4 mg total) by mouth every 8 (eight) hours as needed for nausea or vomiting. (Patient not taking: Reported on 03/05/2022) 20 tablet 2   polyethylene glycol (MIRALAX / GLYCOLAX) 17 g packet Take 17 g by mouth as needed. constipation     No current facility-administered medications for this visit.    HISTORY OF PRESENT ILLNESS:   Oncology History Overview Note  Cancer Staging Malignant neoplasm of upper-outer quadrant of left breast in female, estrogen receptor negative ([place]) Staging form: Breast, AJCC 8th Edition - Clinical stage from 10/11/2018: Stage IIB (cT2, cN1, cM0, G3, ER-, PR-, HER2+) - Signed by Truitt Merle, MD on 11/02/2018 - Clinical: No stage assigned - Unsigned    History of colon cancer, stage II  09/2005 Initial Diagnosis   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 Findings were a 5.9 x 3.8 x 1.2 cm moderate to well-differentiated adenocarcinoma penetrating the muscular wall. [place]  lymph nodes negative. No vascular or lymphatic invasion. Multiple diverticula with microabscess formation and inflammation. Carcinoma present in at least 1 diverticulum. Preop CEA 5.2 with lab normal range 0 to 2.5. Preop CT scan with no obvious additional pathology.    2007 -  Chemotherapy   She received oral Xeloda chemotherapy as an adjuvant. She declined treatment on a clinical trial.    11/12/2011 Initial Diagnosis   [name], stage II   12/09/2011 Procedure   Followup colonoscopy done on 12/09/2011. She was found to have 2 polyps which were removed. Chronic diverticulosis.     Malignant neoplasm of upper-outer quadrant of left breast in female, estrogen receptor negative ([place])  09/26/2018 Mammogram   Mammogram/US of  left breast 09/26/18 IMPRESSION:  1. 2.9cm irregular mass in the upper outer left breast corresponds to the palpable abnormality. This is highly suspicious for breast carcinoma.  2. Two adjacent abnormal left axillary  Lymph nodes suspicious for metastatic adenopathy. There is a third borderline abnormal left axillary LN with a cortex thickened to 34m.  3. Benign right breast cyst. No evidence of right breast malignancy.    10/11/2018 Cancer Staging   Staging form: Breast, AJCC 8th Edition - Clinical stage from 10/11/2018: Stage IIB (cT2, cN1, cM0, G3, ER-, PR-, HER2+) - Signed by [name] MD on 11/02/2018   10/11/2018 Initial Biopsy   Diagnosis 10/11/18 1. Breast, left, needle core biopsy, upper outer left 2 o'clock - INVASIVE DUCTAL CARCINOMA. - DUCTAL CARCINOMA IN SITU. - LYMPHOVASCULAR INVASION IS IDENTIFIED. - SEE COMMENT. 2. Lymph node, needle/core biopsy, left axilla - INVASIVE DUCTAL CARCINOMA. - SEE COMMENT.   10/11/2018 Receptors her2   The tumor cells are POSITIVE for Her2 (3+). Estrogen Receptor: 0%, NEGATIVE Progesterone Receptor: 0%, NEGATIVE Proliferation Marker Ki67: 70%   11/02/2018 Initial Diagnosis   Malignant neoplasm of upper-outer quadrant of left breast in female, estrogen receptor negative ([place]   11/15/2018 Breast MRI   MRI breast 11/15/18  IMPRESSION: 1. 3.3 centimeter mass in the LATERAL portion of the LEFT breast consistent with known malignancy. 2. There is significant non mass enhancement surrounding this mass and extending anteriorly into the nipple base, with largest diameter in the anterior to posterior axis, measuring 7.2 centimeters. 3. If the patient would consider breast conservation, additional MR guided core biopsies are recommended. Consider biopsy of the inferior and anterior extent of the non mass enhancement to document extent of disease. 4. Three enlarged LEFT axillary lymph nodes. 5. RIGHT breast is negative.   11/15/2018 PET scan   PET  11/15/18 IMPRESSION: Hypermetabolic left breast lesion compatible with known primary. Hypermetabolic left axillary lymph nodes are consistent with metastatic disease.   No evidence for additional hypermetabolic metastatic involvement in the neck, chest, abdomen, or pelvis.   11/17/2018 - 03/01/2019 Chemotherapy   Neo-adjuvant Kadcyla and perjeta q3weeks for 6 cycles starting 11/17/18. Stopped before surgery    11/29/2018 Pathology Results   Diagnosis 1. Breast, left, needle core biopsy, inferior anterior (cylinder clip) - INVASIVE DUCTAL CARCINOMA. - DUCTAL CARCINOMA IN SITU. - LYMPHOVASCULAR INVASION IS IDENTIFIED. - SEE COMMENT. 2. Breast, left, needle core biopsy, central posterior (barbell clip) - INVASIVE DUCTAL CARCINOMA. - LYMPHOVASCULAR INVASION IS IDENTIFIED.   02/12/2019 Breast MRI   IMPRESSION: 1. Complete resolution of previously identified enhancing mass and associated non mass enhancement within the left breast. This is consistent with excellent response to chemotherapy. No residual or suspicious findings are identified. 2. No MRI evidence of malignancy on the  right. 3. Previously identified left axillary lymphadenopathy not definitively seen on today's study. However, this may be due to decreased field-of-view compared to prior study.     03/14/2019 Cancer Staging   Staging form: Breast, AJCC 8th Edition - Pathologic stage from 03/14/2019: pT0, pN0, cM0, GX, ER: Unknown, PR: Unknown, HER2: Not Assessed - Signed by Truitt Merle, MD on 03/28/2019   03/14/2019 Surgery   LEFT MASTECTOMY WITH TARGETED LYMPH NODE DISSECTION and Left Axillary Sentinel Lymph  Node Biopsy by Dr [name] 03/14/19    03/14/2019 Pathology Results   FINAL MICROSCOPIC DIAGNOSIS:   A. LYMPH NODE, LEFT, SENTINEL, BIOPSY:  - There is no evidence of carcinoma in 1 of 1 lymph node (0/1).   B. BREAST, LEFT, MASTECTOMY:  - Benign breast parenchyma with treatment-related changes.  - There is no evidence of  malignancy.  - See oncology table below.   C. LYMPH NODE, LEFT #1, SENTINEL, BIOPSY:  - There is no evidence of carcinoma in 1 of 1 lymph node (0/1).   D. LYMPH NODE, LEFT #2, SENTINEL, BIOPSY:  - There is no evidence of carcinoma in 1 of 1 lymph node (0/1).     04/04/2019 - 04/25/2019 Chemotherapy   Maintenance Herceptin injections every 3 weeks starting 04/03/19 to complete 1 year of treatment that was started in 11/2018. Stopped after 2nd dose as she will not be repeating [place].    06/03/2021 - 06/24/2021 Chemotherapy   Patient is on Treatment Plan : BREAST Trastuzumab q21d X 11 Cycles     06/25/2021 - 01/01/2022 Chemotherapy   Patient is on Treatment Plan : BREAST Docetaxel + Trastuzumab + Pertuzumab (THP) q21d     06/25/2021 - 01/02/2022 Chemotherapy   Patient is on Treatment Plan : BREAST Docetaxel + Trastuzumab + Pertuzumab (THP) q21d     09/14/2021 Imaging   CT chest/abdomen/pelvis  IMPRESSION:  1. Interval resolution of previously seen bulky left axillary and  subpectoral lymphadenopathy. No persistently enlarged lymph nodes.  2. Multiple liver lesions are significantly diminished in size.  3. Widespread osseous metastatic disease, with an interval increase  in sclerosis of several previously lytic lesions.  4. Constellation of findings is consistent with treatment response  of metastatic disease  5. New, although age indeterminate pathologic wedge deformity of the  T6 vertebral body as well as an increased pathologic wedge deformity  of the L2 vertebral body.  6. Coronary artery disease.  Aortic Atherosclerosis (ICD10-I70.0).    01/26/2022 -  Chemotherapy   Patient is on Treatment Plan : BREAST METASTATIC Fam-Trastuzumab Deruxtecan-nxki (Enhertu) (5.4) q21d     Malignant neoplasm metastatic to liver ([place])  04/17/2021 Imaging   CT ABDOMEN AND PELVIS WITH CONTRAST: -New lytic lesion involving the L2 vertebral body (6:72, 2:26) with minimal cortical breakthrough superiorly,  but with preservation of vertebral body height, concerning for metastatic disease.  -There are several indeterminate hepatic lesions as described above. In addition to the lesions described above, there is an additional subtle 1.5 cm hypodensity in the hepatic dome (2:7). Given clinical history and presence of a new L2 lesion, leading differential consideration is metastatic disease. Recommend further evaluation with MRI of the abdomen with and without contrast.   05/12/2021 Imaging   MRI ABDOMEN WITH AND WITHOUT CONTRAST: Numerous heterogeneously enhancing, internally necrotic lesions in the right hepatic lobe, highly concerning for metastatic disease. Largest lesion measures up to 6.7 cm in hepatic segment VI.   Enhancing osseous lesions in the L2 vertebral body, both iliac bones,  and in the sacrum, highly concerning for osseous metastases.   05/15/2021 Initial Diagnosis   Liver metastases ([place])   05/27/2021 PET scan   1. Widespread recurrent/metastatic disease in this patient who is  status post bilateral mastectomy. Left chest wall recurrence with  left axillary/subpectoral, supraclavicular nodal metastasis.  2. Hepatic, left adrenal, and widespread osseous metastasis.  3. Incidental findings, including: Right nephrolithiasis. Tiny  hiatal hernia.    05/27/2021 Imaging   MRI LUMBAR AND THORACIC SPINE: Thoracic spine:  Osseous metastatic disease involving each level. The most dramatic deposits are at T4 and T6 where there is extraosseous/epidural tumor. No cord compression but there is foraminal impingement on the left at T6-7 and on the right at T3-4. Early dorsal epidural tumor at the level of T10 and T3.   Lumbar spine:  1. Widespread osseous metastatic disease with largest deposit  replacing the L2 body where there is a compression fracture with  mild height loss. Also at this level is early epidural tumor  extension likely affecting both L2-3 foramina.  2. Lumbar spine degeneration  with scoliosis and multilevel  impingement.    05/27/2021 Imaging   MRI HEAD WITH AND WITHOUT CONTRAST: Negative for metastatic disease to the brain or calvarium.   06/03/2021 - 06/24/2021 Chemotherapy   Patient is on Treatment Plan : BREAST Trastuzumab q21d X 11 Cycles     06/25/2021 - 01/01/2022 Chemotherapy   Patient is on Treatment Plan : BREAST Docetaxel + Trastuzumab + Pertuzumab (THP) q21d     06/25/2021 - 01/02/2022 Chemotherapy   Patient is on Treatment Plan : BREAST Docetaxel + Trastuzumab + Pertuzumab (THP) q21d     09/14/2021 Imaging   CT chest/abdomen/pelvis  IMPRESSION:  1. Interval resolution of previously seen bulky left axillary and  subpectoral lymphadenopathy. No persistently enlarged lymph nodes.  2. Multiple liver lesions are significantly diminished in size.  3. Widespread osseous metastatic disease, with an interval increase  in sclerosis of several previously lytic lesions.  4. Constellation of findings is consistent with treatment response  of metastatic disease  5. New, although age indeterminate pathologic wedge deformity of the  T6 vertebral body as well as an increased pathologic wedge deformity  of the L2 vertebral body.  6. Coronary artery disease.  Aortic Atherosclerosis (ICD10-I70.0).    01/26/2022 -  Chemotherapy   Patient is on Treatment Plan : BREAST METASTATIC Fam-Trastuzumab Deruxtecan-nxki (Enhertu) (5.4) q21d     Malignant neoplasm metastatic to bone ([place])  04/17/2021 Imaging   CT ABDOMEN AND PELVIS WITH CONTRAST: -New lytic lesion involving the L2 vertebral body (6:72, 2:26) with minimal cortical breakthrough superiorly, but with preservation of vertebral body height, concerning for metastatic disease.  -There are several indeterminate hepatic lesions as described above. In addition to the lesions described above, there is an additional subtle 1.5 cm hypodensity in the hepatic dome (2:7). Given clinical history and presence of a new L2 lesion,  leading differential consideration is metastatic disease. Recommend further evaluation with MRI of the abdomen with and without contrast.   05/12/2021 Imaging   MRI ABDOMEN WITH AND WITHOUT CONTRAST: Numerous heterogeneously enhancing, internally necrotic lesions in the right hepatic lobe, highly concerning for metastatic disease. Largest lesion measures up to 6.7 cm in hepatic segment VI.   Enhancing osseous lesions in the L2 vertebral body, both iliac bones, and in the sacrum, highly concerning for osseous metastases.   05/15/2021 Initial Diagnosis   Bone metastases ([place])   05/27/2021 PET scan   1.  Widespread recurrent/metastatic disease in this patient who is  status post bilateral mastectomy. Left chest wall recurrence with  left axillary/subpectoral, supraclavicular nodal metastasis.  2. Hepatic, left adrenal, and widespread osseous metastasis.  3. Incidental findings, including: Right nephrolithiasis. Tiny  hiatal hernia.    05/27/2021 Imaging   MRI LUMBAR AND THORACIC SPINE: Thoracic spine:  Osseous metastatic disease involving each level. The most dramatic deposits are at T4 and T6 where there is extraosseous/epidural tumor. No cord compression but there is foraminal impingement on the left at T6-7 and on the right at T3-4. Early dorsal epidural tumor at the level of T10 and T3.   Lumbar spine:  1. Widespread osseous metastatic disease with largest deposit  replacing the L2 body where there is a compression fracture with  mild height loss. Also at this level is early epidural tumor  extension likely affecting both L2-3 foramina.  2. 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    05/27/2021 Imaging   MRI HEAD WITH AND WITHOUT CONTRAST: Negative for metastatic disease to the brain or calvarium.   06/03/2021 - 06/24/2021 Chemotherapy   Patient is on Treatment Plan : BREAST Trastuzumab q21d X 11 Cycles     06/25/2021 - 01/01/2022 Chemotherapy   Patient is on  Treatment Plan : BREAST Docetaxel + Trastuzumab + Pertuzumab (THP) q21d     06/25/2021 - 01/02/2022 Chemotherapy   Patient is on Treatment Plan : BREAST Docetaxel + Trastuzumab + Pertuzumab (THP) q21d     09/14/2021 Imaging   CT chest/abdomen/pelvis  IMPRESSION:  1. Interval resolution of previously seen bulky left axillary and  subpectoral lymphadenopathy. No persistently enlarged lymph nodes.  2. Multiple liver lesions are significantly diminished in size.  3. Widespread osseous metastatic disease, with an interval increase  in sclerosis of several previously lytic lesions.  4. Constellation of findings is consistent with treatment response  of metastatic disease  5. New, although age indeterminate pathologic wedge deformity of the  T6 vertebral body as well as an increased pathologic wedge deformity  of the L2 vertebral body.  6. Coronary artery disease.  Aortic Atherosclerosis (ICD10-I70.0).    01/26/2022 -  Chemotherapy   Patient is on Treatment Plan : BREAST METASTATIC Fam-Trastuzumab Deruxtecan-nxki (Enhertu) (5.4) q21d         REVIEW OF SYSTEMS:   Review of Systems  Constitutional: Negative.   HENT:  Negative.    Eyes:  Positive for eye problems (tearing of the eye.).  Respiratory: Negative.    Cardiovascular: Negative.   Gastrointestinal:  Positive for constipation (occasionally).  Endocrine: Negative.   Genitourinary: Negative.    Musculoskeletal: Negative.   Skin: Negative.   Neurological:  Positive for numbness (severe paresthesias inability to use her left hand).  Hematological: Negative.   Psychiatric/Behavioral: Negative.        VITALS:  Blood pressure (!) 145/91, pulse 80, temperature 97.9 F (36.6 C), temperature source Oral, resp. rate 16, height 5' 0.25" (1.53 m), weight 106 lb 4.8 oz (48.2 kg), SpO2 97 %.  Wt Readings from Last 3 Encounters:  04/15/22 106 lb 4.8 oz (48.2 kg)  03/30/22 109 lb 8 oz (49.7 kg)  03/25/22 109 lb (49.4 kg)    Body mass  index is 20.59 kg/m.  Performance status (ECOG): 1 - Symptomatic but completely ambulatory  PHYSICAL EXAM:  Physical Exam Exam conducted with a chaperone present.  Constitutional:      Appearance: Normal appearance. She is normal weight.  HENT:  Head: Normocephalic and atraumatic.     Nose: Nose normal.     Mouth/Throat:     Mouth: Mucous membranes are moist.     Pharynx: Oropharynx is clear.  Eyes:     Extraocular Movements: Extraocular movements intact.     Conjunctiva/sclera: Conjunctivae normal.     Pupils: Pupils are equal, round, and reactive to light.  Cardiovascular:     Rate and Rhythm: Normal rate and regular rhythm.     Pulses: Normal pulses.     Heart sounds: Normal heart sounds.  Pulmonary:     Effort: Pulmonary effort is normal.     Breath sounds: Normal breath sounds.  Chest:  Breasts:    Right: Normal.     Left: Absent.     Comments: Left mastectomy is negative but she has a 2 cm nodule adjacent to the left shoulder, which is softer and slightly smaller. The nodularity of the left upper chest wall is improved Her l\ Abdominal:     General: Bowel sounds are normal.     Palpations: Abdomen is soft.  Musculoskeletal:        General: Normal range of motion.     Cervical back: Normal range of motion.     Comments: Mild lymphedema of the left arm  Skin:    General: Skin is warm and dry.     Findings: Erythema (mild) present.  Neurological:     Mental Status: She is alert and oriented to person, place, and time.     Motor: Weakness (She has severe paresthesias of the left upper extremity with inability to use her left hand) present.  Psychiatric:        Mood and Affect: Mood normal.        Behavior: Behavior normal.        Thought Content: Thought content normal.        Judgment: Judgment normal.         LABORATORY DATA:  I have reviewed the data as listed    Component Value Date/Time   NA 137 04/15/2022 0000   NA 137 11/13/2012 1057   K 4.0  04/15/2022 0000   K 4.5 11/13/2012 1057   CL 105 04/15/2022 0000   CO2 27 (A) 04/15/2022 0000   CO2 26 11/13/2012 1057   GLUCOSE 94 03/25/2022 1434   GLUCOSE 90 11/13/2012 1057   BUN 21 04/15/2022 0000   BUN 18.3 11/13/2012 1057   CREATININE 0.6 04/15/2022 0000   CREATININE 0.72 03/25/2022 1434   CREATININE 0.8 11/13/2012 1057   CALCIUM 9.9 04/15/2022 0000   CALCIUM 9.7 11/13/2012 1057   PROT 6.6 03/25/2022 1434   PROT 7.4 11/13/2012 1057   ALBUMIN 4.0 04/15/2022 0000   ALBUMIN 3.9 11/13/2012 1057   AST 36 (A) 04/15/2022 0000   AST 31 03/25/2022 1434   AST 22 11/13/2012 1057   ALT 20 04/15/2022 0000   ALT 17 03/25/2022 1434   ALT 15 11/13/2012 1057   ALKPHOS 64 04/15/2022 0000   ALKPHOS 77 11/13/2012 1057   BILITOT 0.5 03/25/2022 1434   BILITOT 0.54 11/13/2012 1057   GFRNONAA >60 03/25/2022 1434   GFRAA >60 04/25/2019 1045    No results found for: "SPEP", "UPEP"  Lab Results  Component Value Date   WBC 6.0 04/15/2022   NEUTROABS 3.42 04/15/2022   HGB 14.1 04/15/2022   HCT 41 04/15/2022   MCV 100 (A) 04/15/2022   PLT 164 04/15/2022      Chemistry  Component Value Date/Time   NA 137 04/15/2022 0000   NA 137 11/13/2012 1057   K 4.0 04/15/2022 0000   K 4.5 11/13/2012 1057   CL 105 04/15/2022 0000   CO2 27 (A) 04/15/2022 0000   CO2 26 11/13/2012 1057   BUN 21 04/15/2022 0000   BUN 18.3 11/13/2012 1057   CREATININE 0.6 04/15/2022 0000   CREATININE 0.72 03/25/2022 1434   CREATININE 0.8 11/13/2012 1057   GLU 102 04/15/2022 0000      Component Value Date/Time   CALCIUM 9.9 04/15/2022 0000   CALCIUM 9.7 11/13/2012 1057   ALKPHOS 64 04/15/2022 0000   ALKPHOS 77 11/13/2012 1057   AST 36 (A) 04/15/2022 0000   AST 31 03/25/2022 1434   AST 22 11/13/2012 1057   ALT 20 04/15/2022 0000   ALT 17 03/25/2022 1434   ALT 15 11/13/2012 1057   BILITOT 0.5 03/25/2022 1434   BILITOT 0.54 11/13/2012 1057       RADIOGRAPHIC STUDIES: ECHOCARDIOGRAM  COMPLETE  Result Date: 03/24/2022    ECHOCARDIOGRAM REPORT   Patient Name:   Jasmin Lewis Date of Exam: 03/24/2022 Medical Rec #:  [id number]         Height:       60.2 in Accession #:    [id number]        Weight:       108.2 lb Date of Birth:  1931/11/15         BSA:          1.442 m Patient Age:    5 years          BP:           126/54 mmHg Patient Gender: F                 HR:           79 bpm. Exam Location:  [facility] Procedure: 2D Echo, Cardiac Doppler, Color Doppler and Strain Analysis Indications:    Malignant neoplasm metastatic to liver ([place]) [C78.7                 (ICD-10-CM)]; Malignant neoplasm metastatic to bone ([place])                 [C79.51 (ICD-10-CM)]; Malignant neoplasm of upper-outer quadrant                 of left breast in female, estrogen receptor negative ([place])                 [C50.412, Z17.1 (ICD-10-CM)]; Other ill-defined heart diseases                 [I51.89 (ICD-10-CM)]  History:        Patient has prior history of Echocardiogram examinations, most                 recent 12/17/2021.  Sonographer:    Philipp Deputy RDCS Referring Phys: [place]  1. Sigmoid septum. GLS -16.0. Left ventricular ejection fraction, by estimation, is 60 to 65%. The left ventricle has normal function. The left ventricle has no regional wall motion abnormalities. Left ventricular diastolic parameters were normal.  2. Right ventricular systolic function is normal. The right ventricular size is normal. There is normal pulmonary artery systolic pressure.  3. The mitral valve is normal in structure. No evidence of mitral valve regurgitation. No evidence of mitral stenosis.  4. The aortic valve is calcified. There is  mild calcification of the aortic valve. There is mild thickening of the aortic valve. Aortic valve regurgitation is mild. No aortic stenosis is present.  5. The inferior vena cava is normal in size with greater than 50% respiratory variability, suggesting right atrial  pressure of 3 mmHg. FINDINGS  Left Ventricle: Sigmoid septum. GLS -16.0. Left ventricular ejection fraction, by estimation, is 60 to 65%. The left ventricle has normal function. The left ventricle has no regional wall motion abnormalities. The left ventricular internal cavity size was normal in size. There is no left ventricular hypertrophy. Left ventricular diastolic parameters were normal. Right Ventricle: The right ventricular size is normal. No increase in right ventricular wall thickness. Right ventricular systolic function is normal. There is normal pulmonary artery systolic pressure. The tricuspid regurgitant velocity is 2.56 m/s, and  with an assumed right atrial pressure of 3 mmHg, the estimated right ventricular systolic pressure is 27.0 mmHg. Left Atrium: Left atrial size was normal in size. Right Atrium: Right atrial size was normal in size. Pericardium: There is no evidence of pericardial effusion. Mitral Valve: The mitral valve is normal in structure. No evidence of mitral valve regurgitation. No evidence of mitral valve stenosis. Tricuspid Valve: The tricuspid valve is normal in structure. Tricuspid valve regurgitation is mild . No evidence of tricuspid stenosis. Aortic Valve: The aortic valve is calcified. There is mild calcification of the aortic valve. There is mild thickening of the aortic valve. Aortic valve regurgitation is mild. Aortic regurgitation PHT measures 571 msec. No aortic stenosis is present. Pulmonic Valve: The pulmonic valve was normal in structure. Pulmonic valve regurgitation is not visualized. No evidence of pulmonic stenosis. Aorta: The aortic root is normal in size and structure. Venous: The inferior vena cava is normal in size with greater than 50% respiratory variability, suggesting right atrial pressure of 3 mmHg. IAS/Shunts: No atrial level shunt detected by color flow Doppler.  LEFT VENTRICLE PLAX 2D LVIDd:         3.50 cm   Diastology LVIDs:         2.30 cm   LV e'  medial:    7.83 cm/s LV PW:         1.00 cm   LV E/e' medial:  12.0 LV IVS:        1.20 cm   LV e' lateral:   9.36 cm/s LVOT diam:     1.80 cm   LV E/e' lateral: 10.1 LV SV:         59 LV SV Index:   41 LVOT Area:     2.54 cm  RIGHT VENTRICLE             IVC RV Basal diam:  2.70 cm     IVC diam: 1.20 cm RV S prime:     12.60 cm/s TAPSE (M-mode): 2.9 cm LEFT ATRIUM             Index        RIGHT ATRIUM           Index LA diam:        2.70 cm 1.87 cm/m   RA Area:     14.70 cm LA Vol (A2C):   31.1 ml 21.57 ml/m  RA Volume:   33.30 ml  23.09 ml/m LA Vol (A4C):   44.3 ml 30.72 ml/m LA Biplane Vol: 37.1 ml 25.73 ml/m  AORTIC VALVE LVOT Vmax:   114.00 cm/s LVOT Vmean:  72.300 cm/s LVOT VTI:  0.233 m AI PHT:      571 msec  AORTA Ao Root diam: 2.90 cm Ao Asc diam:  3.10 cm Ao Desc diam: 1.80 cm MITRAL VALVE               TRICUSPID VALVE MV Area (PHT): 4.39 cm    TR Peak grad:   26.2 mmHg MV Decel Time: 173 msec    TR Vmax:        256.00 cm/s MV E velocity: 94.30 cm/s MV A velocity: 80.80 cm/s  SHUNTS MV E/A ratio:  1.17        Systemic VTI:  0.23 m                            Systemic Diam: 1.80 cm Jenne Campus MD Electronically signed by Jenne Campus MD Signature Date/Time: 03/24/2022/12:31:22 PM    Final    EXAM:12/07/21 MRI OF THE LEFT HUMERUS WITHOUT AND WITH CONTRAST IMPRESSION: Changes of marrow reconversion without evidence of aggressive focal bone lesion in left humerus. Skin thickening and soft tissue swelling of the left upper extremity consistent with lymphedema in the setting of prior left axillary lymph node dissection.      I,Gabriella Ballesteros,acting as a scribe for [name], MD.,have documented all relevant documentation on the behalf of [name], MD,as directed by  [name], MD while in the presence of [name], MD.

## 2022-04-14 ENCOUNTER — Encounter: Payer: Self-pay | Admitting: Oncology

## 2022-04-14 NOTE — Progress Notes (Signed)
This encounter was created in error - please disregard.

## 2022-04-15 ENCOUNTER — Other Ambulatory Visit: Payer: Self-pay | Admitting: Oncology

## 2022-04-15 ENCOUNTER — Inpatient Hospital Stay: Payer: Medicare Other | Attending: Oncology | Admitting: Oncology

## 2022-04-15 ENCOUNTER — Other Ambulatory Visit: Payer: Self-pay

## 2022-04-15 ENCOUNTER — Inpatient Hospital Stay: Payer: Medicare Other

## 2022-04-15 VITALS — BP 145/91 | HR 80 | Temp 97.9°F | Resp 16 | Ht 60.25 in | Wt 106.3 lb

## 2022-04-15 DIAGNOSIS — R200 Anesthesia of skin: Secondary | ICD-10-CM | POA: Diagnosis not present

## 2022-04-15 DIAGNOSIS — Z9012 Acquired absence of left breast and nipple: Secondary | ICD-10-CM | POA: Diagnosis not present

## 2022-04-15 DIAGNOSIS — I890 Lymphedema, not elsewhere classified: Secondary | ICD-10-CM | POA: Insufficient documentation

## 2022-04-15 DIAGNOSIS — Z5112 Encounter for antineoplastic immunotherapy: Secondary | ICD-10-CM | POA: Insufficient documentation

## 2022-04-15 DIAGNOSIS — Z171 Estrogen receptor negative status [ER-]: Secondary | ICD-10-CM

## 2022-04-15 DIAGNOSIS — Z85038 Personal history of other malignant neoplasm of large intestine: Secondary | ICD-10-CM | POA: Diagnosis not present

## 2022-04-15 DIAGNOSIS — C50412 Malignant neoplasm of upper-outer quadrant of left female breast: Secondary | ICD-10-CM | POA: Insufficient documentation

## 2022-04-15 DIAGNOSIS — R600 Localized edema: Secondary | ICD-10-CM | POA: Diagnosis not present

## 2022-04-15 DIAGNOSIS — K5900 Constipation, unspecified: Secondary | ICD-10-CM | POA: Insufficient documentation

## 2022-04-15 DIAGNOSIS — C778 Secondary and unspecified malignant neoplasm of lymph nodes of multiple regions: Secondary | ICD-10-CM | POA: Diagnosis not present

## 2022-04-15 DIAGNOSIS — C7951 Secondary malignant neoplasm of bone: Secondary | ICD-10-CM | POA: Insufficient documentation

## 2022-04-15 DIAGNOSIS — C787 Secondary malignant neoplasm of liver and intrahepatic bile duct: Secondary | ICD-10-CM | POA: Diagnosis not present

## 2022-04-15 LAB — HEPATIC FUNCTION PANEL
ALT: 20 U/L (ref 7–35)
AST: 36 — AB (ref 13–35)
Alkaline Phosphatase: 64 (ref 25–125)
Bilirubin, Total: 0.6

## 2022-04-15 LAB — CBC AND DIFFERENTIAL
HCT: 41 (ref 36–46)
Hemoglobin: 14.1 (ref 12.0–16.0)
MCV: 100 — AB (ref 81–99)
Neutrophils Absolute: 3.42
Platelets: 164 10*3/uL (ref 150–400)
WBC: 6

## 2022-04-15 LAB — BASIC METABOLIC PANEL
BUN: 21 (ref 4–21)
CO2: 27 — AB (ref 13–22)
Chloride: 105 (ref 99–108)
Creatinine: 0.6 (ref 0.5–1.1)
Glucose: 102
Potassium: 4 mEq/L (ref 3.5–5.1)
Sodium: 137 (ref 137–147)

## 2022-04-15 LAB — COMPREHENSIVE METABOLIC PANEL
Albumin: 4 (ref 3.5–5.0)
Calcium: 9.9 (ref 8.7–10.7)

## 2022-04-15 LAB — CBC: RBC: 4.1 (ref 3.87–5.11)

## 2022-04-16 ENCOUNTER — Other Ambulatory Visit: Payer: Self-pay

## 2022-04-16 ENCOUNTER — Encounter: Payer: Self-pay | Admitting: Oncology

## 2022-04-16 ENCOUNTER — Other Ambulatory Visit: Payer: Self-pay | Admitting: Hematology and Oncology

## 2022-04-19 ENCOUNTER — Other Ambulatory Visit: Payer: Self-pay

## 2022-04-19 ENCOUNTER — Telehealth: Payer: Self-pay

## 2022-04-19 DIAGNOSIS — Z171 Estrogen receptor negative status [ER-]: Secondary | ICD-10-CM

## 2022-04-19 DIAGNOSIS — I890 Lymphedema, not elsewhere classified: Secondary | ICD-10-CM

## 2022-04-19 MED FILL — Fam-Trastuzumab Deruxtecan-nxki For IV Soln 100 MG: INTRAVENOUS | Qty: 10 | Status: AC

## 2022-04-19 MED FILL — Dexamethasone Sodium Phosphate Inj 100 MG/10ML: INTRAMUSCULAR | Qty: 1 | Status: AC

## 2022-04-19 NOTE — Progress Notes (Signed)
Error

## 2022-04-19 NOTE — Telephone Encounter (Signed)
Referral sent on 04/19/22.

## 2022-04-19 NOTE — Telephone Encounter (Signed)
-----   Message from [name], MD sent at 04/15/2022  5:35 PM EST ----- Regarding: referral They request referral to [facility], Dr. Rudene Christians, Dr. Roland Rack - regarding nerve conduction tests for left upper extremity with loss of sensation and motor skills in pt with lymphedema and recurrent breast cancer I have no idea how you need to go about this but they were not happy with how it went last time and how long it took.

## 2022-04-20 ENCOUNTER — Inpatient Hospital Stay: Payer: Medicare Other

## 2022-04-20 VITALS — BP 152/52 | HR 79 | Temp 98.1°F | Resp 18 | Ht 60.25 in | Wt 106.0 lb

## 2022-04-20 DIAGNOSIS — C7951 Secondary malignant neoplasm of bone: Secondary | ICD-10-CM

## 2022-04-20 DIAGNOSIS — C787 Secondary malignant neoplasm of liver and intrahepatic bile duct: Secondary | ICD-10-CM

## 2022-04-20 DIAGNOSIS — Z5112 Encounter for antineoplastic immunotherapy: Secondary | ICD-10-CM | POA: Diagnosis not present

## 2022-04-20 DIAGNOSIS — Z171 Estrogen receptor negative status [ER-]: Secondary | ICD-10-CM

## 2022-04-20 MED ORDER — FAM-TRASTUZUMAB DERUXTECAN-NXKI CHEMO 100 MG IV SOLR
3.9500 mg/kg | Freq: Once | INTRAVENOUS | Status: AC
  Administered 2022-04-20: 200 mg via INTRAVENOUS
  Filled 2022-04-20: qty 10

## 2022-04-20 MED ORDER — DEXTROSE 5 % IV SOLN: Freq: Once | INTRAVENOUS | Status: AC

## 2022-04-20 MED ORDER — PALONOSETRON HCL INJECTION 0.25 MG/5ML
0.2500 mg | Freq: Once | INTRAVENOUS | Status: AC
  Administered 2022-04-20: 0.25 mg via INTRAVENOUS
  Filled 2022-04-20: qty 5

## 2022-04-20 MED ORDER — SODIUM CHLORIDE 0.9 % IV SOLN
10.0000 mg | Freq: Once | INTRAVENOUS | Status: AC
  Administered 2022-04-20: 10 mg via INTRAVENOUS
  Filled 2022-04-20: qty 10

## 2022-04-20 MED ORDER — ACETAMINOPHEN 325 MG PO TABS
650.0000 mg | ORAL_TABLET | Freq: Once | ORAL | Status: AC
  Administered 2022-04-20: 650 mg via ORAL
  Filled 2022-04-20: qty 2

## 2022-04-20 MED ORDER — DIPHENHYDRAMINE HCL 25 MG PO CAPS
50.0000 mg | ORAL_CAPSULE | Freq: Once | ORAL | Status: AC
  Administered 2022-04-20: 50 mg via ORAL
  Filled 2022-04-20: qty 2

## 2022-04-20 MED ORDER — SODIUM CHLORIDE 0.9% FLUSH
10.0000 mL | INTRAVENOUS | Status: DC | PRN
  Administered 2022-04-20: 10 mL

## 2022-04-20 MED ORDER — HEPARIN SOD (PORK) LOCK FLUSH 100 UNIT/ML IV SOLN
500.0000 [IU] | Freq: Once | INTRAVENOUS | Status: AC | PRN
  Administered 2022-04-20: 500 [IU]

## 2022-04-20 NOTE — Patient Instructions (Signed)
Fam-Trastuzumab Deruxtecan Injection What is this medication? FAM-TRASTUZUMAB DERUXTECAN (fam-tras TOOZ eu mab DER ux TEE kan) treats some types of cancer. It works by blocking a protein that causes cancer cells to grow and multiply. This helps to slow or stop the spread of cancer cells. This medicine may be used for other purposes; ask your health care provider or pharmacist if you have questions. COMMON BRAND NAME(S): ENHERTU What should I tell my care team before I take this medication? They need to know if you have any of these conditions: Heart disease Heart failure Infection, especially a viral infection, such as chickenpox, cold sores, or herpes Liver disease Lung or breathing disease, such as asthma or COPD An unusual or allergic reaction to fam-trastuzumab deruxtecan, other medications, foods, dyes, or preservatives Pregnant or trying to get pregnant Breast-feeding How should I use this medication? This medication is injected into a vein. It is given by your care team in a hospital or clinic setting. A special MedGuide will be given to you before each treatment. Be sure to read this information carefully each time. Talk to your care team about the use of this medication in children. Special care may be needed. Overdosage: If you think you have taken too much of this medicine contact a poison control center or emergency room at once. NOTE: This medicine is only for you. Do not share this medicine with others. What if I miss a dose? It is important not to miss your dose. Call your care team if you are unable to keep an appointment. What may interact with this medication? Interactions are not expected. This list may not describe all possible interactions. Give your health care provider a list of all the medicines, herbs, non-prescription drugs, or dietary supplements you use. Also tell them if you smoke, drink alcohol, or use illegal drugs. Some items may interact with your  medicine. What should I watch for while using this medication? Visit your care team for regular checks on your progress. Tell your care team if your symptoms do not start to get better or if they get worse. Your condition will be monitored carefully while you are receiving this medication. Do not become pregnant while taking this medication or for 7 months after stopping it. Women should inform their care team if they wish to become pregnant or think they might be pregnant. Men should not father a child while taking this medication and for 4 months after stopping it. There is potential for serious side effects to an unborn child. Talk to your care team for more information. Do not breast-feed an infant while taking this medication or for 7 months after the last dose. This medication has caused decreased sperm counts in some men. This may make it more difficult to father a child. Talk to your care team if you are concerned about your fertility. This medication may increase your risk to bruise or bleed. Call your care team if you notice any unusual bleeding. Be careful brushing or flossing your teeth or using a toothpick because you may get an infection or bleed more easily. If you have any dental work done, tell your dentist you are receiving this medication. This medication may cause dry eyes and blurred vision. If you wear contact lenses, you may feel some discomfort. Lubricating eye drops may help. See your care team if the problem does not go away or is severe. This medication may increase your risk of getting an infection. Call your care team for   advice if you get a fever, chills, sore throat, or other symptoms of a cold or flu. Do not treat yourself. Try to avoid being around people who are sick. Avoid taking medications that contain aspirin, acetaminophen, ibuprofen, naproxen, or ketoprofen unless instructed by your care team. These medications may hide a fever. What side effects may I notice from  receiving this medication? Side effects that you should report to your care team as soon as possible: Allergic reactions--skin rash, itching, hives, swelling of the face, lips, tongue, or throat Dry cough, shortness of breath or trouble breathing Infection--fever, chills, cough, sore throat, wounds that don't heal, pain or trouble when passing urine, general feeling of discomfort or being unwell Heart failure--shortness of breath, swelling of the ankles, feet, or hands, sudden weight gain, unusual weakness or fatigue Unusual bruising or bleeding Side effects that usually do not require medical attention (report these to your care team if they continue or are bothersome): Constipation Diarrhea Hair loss Muscle pain Nausea Vomiting This list may not describe all possible side effects. Call your doctor for medical advice about side effects. You may report side effects to FDA at 1-800-FDA-1088. Where should I keep my medication? This medication is given in a hospital or clinic. It will not be stored at home. NOTE: This sheet is a summary. It may not cover all possible information. If you have questions about this medicine, talk to your doctor, pharmacist, or health care provider.  2023 Elsevier/Gold Standard (2021-01-07 00:00:00) 

## 2022-04-21 LAB — VITAMIN D 1,25 DIHYDROXY
Vitamin D 1, 25 (OH)2 Total: 69 pg/mL — ABNORMAL HIGH
Vitamin D2 1, 25 (OH)2: <10 pg/mL
Vitamin D3 1, 25 (OH)2: 69 pg/mL

## 2022-04-22 NOTE — Progress Notes (Signed)
Patient Care Team: Jasmin Ganong, MD as PCP - General (Family Medicine) [name], MD as Consulting Physician (Oncology)  Clinic Day: 05/06/22    Referring physician: [name], MD  ASSESSMENT & PLAN:   Assessment & Plan: Malignant neoplasm of upper-outer quadrant of left breast in female, estrogen receptor negative ([place]) Stage IIB (T2c N1 M0) HER2 receptor positive left breast cancer, diagnosed in June 2020. This was ER/PR negative. This was treated with neoadjuvant Kadcyla/Perjeta and left mastectomy, with a complete response in breast and nodes. She had only received two doses of adjuvant Herceptin before being discontinued in December 2020.   History of colon cancer, stage II History of stage II colon cancer, diagnosed in May 2007. This was treated with surgical resection and adjuvant chemotherapy with Xeloda. Colonoscopy from 2013 revealed 2 polyps which were resected and chronic diverticulosis.   Malignant neoplasm metastatic to liver [facility]) Liver metastases discovered 04/17/21.  Numerous heterogeneously enhancing, internally necrotic lesions in the right hepatic lobe, consistent with metastatic disease. Largest lesion measures up to 6.7 cm and there are at least 6 lesions. We now have biopsy proven HER2 positive recurrent breast cancer with ER/PR negative. She received THP and tolerated it without significant difficulty.  CT scan showed excellent response with significant decrease in the size of her liver lesions.   Malignant neoplasm metastatic to bone [facility]) Bone metastases discovered 04/17/21. Enhancing osseous lesions are present in the L2 vertebral body, both iliac bones, and in the sacrum, highly concerning for osseous metastases. She received her 1st dose of zoledronic acid on January 16th.  Her widespread bone metastases show an interval increase in sclerosis of previously lytic lesions consistent with treatment response.   Chest wall recurrence of breast cancer,  left ([name]) Left supraclavicular lymph node measuring 2-3 cm. Also involvement of subpectoral node and chest wall skin. This was rapidly progressive despite receiving  trastuzumab.  We have added in Perjeta and Taxotere to her current regimen and this has been very effective.  The CT scan also shows good response of her adenopathy with resolution of the previous bulky left axillary and subpectoral adenopathy. In September of 2023, she had a 2 cm nodule  located close to the left shoulder associated with nodularity of the left upper chest wall and appeared to have obvious progression of disease again. She was changed to [facility] and is responding well.  Lymphedema She does have existing lymphedema to the left arm; however, this is better today, with significant edema of the hand as well. We will refer her to a lymphedema specialist and she requests one in [place], so we will arrange that. She sleeps with the hand and arm elevated.  She has decreased use of her left hand but I think this is due to more than just the lymphedema.  Plan She had definite progression of disease in September, evident on the left upper chest wall again, and so we switched her treatment to Enhertu. She is clearly responding already. She no longer has any visible or palpable disease. She has seen the lymphedema specialist and is well pleased with her recommendations.  They are now requesting a referral to the [place] [facility] at [facility] for nerve conduction evaluation by Dr. Rudene Christians and Dr. Roland Rack, so I will make that referral.  I will see her back in 3 weeks with CBC and CMP for  her 7th cycle of Enhertu. We can plan restaging scans in January after she has had 6 cycles.  However, we discussed it and clearly she is responding based on physical exam, so we could postpone restaging scans until later in the year.  The patient and her son understand the plans discussed today and are in agreement with them.  She knows to contact our  office if she develops concerns prior to her next appointment.     [name], MD  [place] [address] [place] [address] Dept: [phone] Dept Fax: [phone]   [name].     CHIEF COMPLAINT:  CC: An 88 year old female with metastatic breast cancer   Current Treatment:  Planning Enhertu  INTERVAL HISTORY:  Jasmin Lewis is here today for repeat clinical assessment prior to her 6th cycle of Enhertu on 04/20/22. She is responding very well to the Enhertu and no longer has visible disease of her lleft chest wall.  Her infusions are on Tuesdays. She claims that she is handling chemotherapy ok however, she is still a little weak and her eyes tear a lot. Patient states that she is waiting on nerve conduction test for her hand and I made a referral to the [facility] center at [facility] to see Dr. Rudene Christians or Dr. Roland Rack, I will make that referral for nerve conduction test and further evaluation. She states she also has had constipation and may be because of medications she is taking. She has lymphedema of the left arm which is stable since the last visit. I checked a vitamin D level and it was normal.  The patient's labs currently look good with normal CBC and CMP. She denies fevers or chills. She denies pain. Her appetite is good. Her weight has increased 2 pound in the past 3 weeks.   I have reviewed the past medical history, past surgical history, social history and family history with the patient and they are unchanged from previous note.  ALLERGIES:  has No Known Allergies.  MEDICATIONS:  Current Outpatient Medications  Medication Sig Dispense Refill   Biotin 1 MG CAPS Take by mouth.     fish oil-omega-3 fatty acids 1000 MG capsule Take 1 g by mouth daily.     gabapentin (NEURONTIN) 100 MG capsule Take 1 capsule (100 mg total) by mouth at bedtime. (Patient not  taking: Reported on 03/05/2022) 30 capsule 5   magnesium citrate SOLN Take 0.5 Bottles by mouth as needed for severe constipation.     Multiple Vitamin (MULTIVITAMIN) capsule Take 1 capsule by mouth daily.     ondansetron (ZOFRAN) 4 MG tablet Take 1 tablet (4 mg total) by mouth every 8 (eight) hours as needed for nausea or vomiting. (Patient not taking: Reported on 03/05/2022) 20 tablet 2   polyethylene glycol (MIRALAX / GLYCOLAX) 17 g packet Take 17 g by mouth as needed. constipation     No current facility-administered medications for this visit.    HISTORY OF PRESENT ILLNESS:   Oncology History Overview Note  Cancer Staging Malignant neoplasm of upper-outer quadrant of left breast in female, estrogen receptor negative ([place]) Staging form: Breast, AJCC 8th Edition - Clinical stage from 10/11/2018: Stage IIB (cT2, cN1, cM0, G3, ER-, PR-, HER2+) - Signed by Truitt Merle, MD on 11/02/2018 - Clinical: No stage assigned - Unsigned    History of colon cancer, stage II  09/2005 Initial Diagnosis   stage II adenocarcinoma of the sigmoid colon diagnosed in May 2007   09/21/2005 Surgery  She underwent surgical resection on 09/21/2005. Findings were a 5.9 x 3.8 x 1.2 cm moderate to well-differentiated adenocarcinoma penetrating the muscular wall. Forty lymph nodes negative. No vascular or lymphatic invasion. Multiple diverticula with microabscess formation and inflammation. Carcinoma present in at least 1 diverticulum. Preop CEA 5.2 with lab normal range 0 to 2.5. Preop CT scan with no obvious additional pathology.    2007 -  Chemotherapy   She received oral Xeloda chemotherapy as an adjuvant. She declined treatment on a clinical trial.    11/12/2011 Initial Diagnosis   [name], stage II   12/09/2011 Procedure   Followup colonoscopy done on 12/09/2011. She was found to have 2 polyps which were removed. Chronic diverticulosis.     Malignant neoplasm of upper-outer quadrant of left breast in  female, estrogen receptor negative ([place])  09/26/2018 Mammogram   Mammogram/US of left breast 09/26/18 IMPRESSION:  1. 2.9cm irregular mass in the upper outer left breast corresponds to the palpable abnormality. This is highly suspicious for breast carcinoma.  2. Two adjacent abnormal left axillary  Lymph nodes suspicious for metastatic adenopathy. There is a third borderline abnormal left axillary LN with a cortex thickened to 7m.  3. Benign right breast cyst. No evidence of right breast malignancy.    10/11/2018 Cancer Staging   Staging form: Breast, AJCC 8th Edition - Clinical stage from 10/11/2018: Stage IIB (cT2, cN1, cM0, G3, ER-, PR-, HER2+) - Signed by [name] MD on 11/02/2018   10/11/2018 Initial Biopsy   Diagnosis 10/11/18 1. Breast, left, needle core biopsy, upper outer left 2 o'clock - INVASIVE DUCTAL CARCINOMA. - DUCTAL CARCINOMA IN SITU. - LYMPHOVASCULAR INVASION IS IDENTIFIED. - SEE COMMENT. 2. Lymph node, needle/core biopsy, left axilla - INVASIVE DUCTAL CARCINOMA. - SEE COMMENT.   10/11/2018 Receptors her2   The tumor cells are POSITIVE for Her2 (3+). Estrogen Receptor: 0%, NEGATIVE Progesterone Receptor: 0%, NEGATIVE Proliferation Marker Ki67: 70%   11/02/2018 Initial Diagnosis   Malignant neoplasm of upper-outer quadrant of left breast in female, estrogen receptor negative ([place]   11/15/2018 Breast MRI   MRI breast 11/15/18  IMPRESSION: 1. 3.3 centimeter mass in the LATERAL portion of the LEFT breast consistent with known malignancy. 2. There is significant non mass enhancement surrounding this mass and extending anteriorly into the nipple base, with largest diameter in the anterior to posterior axis, measuring 7.2 centimeters. 3. If the patient would consider breast conservation, additional MR guided core biopsies are recommended. Consider biopsy of the inferior and anterior extent of the non mass enhancement to document extent of disease. 4. Three enlarged LEFT  axillary lymph nodes. 5. RIGHT breast is negative.   11/15/2018 PET scan   PET 11/15/18 IMPRESSION: Hypermetabolic left breast lesion compatible with known primary. Hypermetabolic left axillary lymph nodes are consistent with metastatic disease.   No evidence for additional hypermetabolic metastatic involvement in the neck, chest, abdomen, or pelvis.   11/17/2018 - 03/01/2019 Chemotherapy   Neo-adjuvant Kadcyla and perjeta q3weeks for 6 cycles starting 11/17/18. Stopped before surgery    11/29/2018 Pathology Results   Diagnosis 1. Breast, left, needle core biopsy, inferior anterior (cylinder clip) - INVASIVE DUCTAL CARCINOMA. - DUCTAL CARCINOMA IN SITU. - LYMPHOVASCULAR INVASION IS IDENTIFIED. - SEE COMMENT. 2. Breast, left, needle core biopsy, central posterior (barbell clip) - INVASIVE DUCTAL CARCINOMA. - LYMPHOVASCULAR INVASION IS IDENTIFIED.   02/12/2019 Breast MRI   IMPRESSION: 1. Complete resolution of previously identified enhancing mass and associated non mass enhancement within the left  breast. This is consistent with excellent response to chemotherapy. No residual or suspicious findings are identified. 2. No MRI evidence of malignancy on the right. 3. Previously identified left axillary lymphadenopathy not definitively seen on today's study. However, this may be due to decreased field-of-view compared to prior study.     03/14/2019 Cancer Staging   Staging form: Breast, AJCC 8th Edition - Pathologic stage from 03/14/2019: pT0, pN0, cM0, GX, ER: Unknown, PR: Unknown, HER2: Not Assessed - Signed by Truitt Merle, MD on 03/28/2019   03/14/2019 Surgery   LEFT MASTECTOMY WITH TARGETED LYMPH NODE DISSECTION and Left Axillary Sentinel Lymph  Node Biopsy by Dr [name] 03/14/19    03/14/2019 Pathology Results   FINAL MICROSCOPIC DIAGNOSIS:   A. LYMPH NODE, LEFT, SENTINEL, BIOPSY:  - There is no evidence of carcinoma in 1 of 1 lymph node (0/1).   B. BREAST, LEFT, MASTECTOMY:  -  Benign breast parenchyma with treatment-related changes.  - There is no evidence of malignancy.  - See oncology table below.   C. LYMPH NODE, LEFT #1, SENTINEL, BIOPSY:  - There is no evidence of carcinoma in 1 of 1 lymph node (0/1).   D. LYMPH NODE, LEFT #2, SENTINEL, BIOPSY:  - There is no evidence of carcinoma in 1 of 1 lymph node (0/1).     04/04/2019 - 04/25/2019 Chemotherapy   Maintenance Herceptin injections every 3 weeks starting 04/03/19 to complete 1 year of treatment that was started in 11/2018. Stopped after 2nd dose as she will not be repeating Echos.    06/03/2021 - 06/24/2021 Chemotherapy   Patient is on Treatment Plan : BREAST Trastuzumab q21d X 11 Cycles     06/25/2021 - 01/01/2022 Chemotherapy   Patient is on Treatment Plan : BREAST Docetaxel + Trastuzumab + Pertuzumab (THP) q21d     06/25/2021 - 01/02/2022 Chemotherapy   Patient is on Treatment Plan : BREAST Docetaxel + Trastuzumab + Pertuzumab (THP) q21d     09/14/2021 Imaging   CT chest/abdomen/pelvis  IMPRESSION:  1. Interval resolution of previously seen bulky left axillary and  subpectoral lymphadenopathy. No persistently enlarged lymph nodes.  2. Multiple liver lesions are significantly diminished in size.  3. Widespread osseous metastatic disease, with an interval increase  in sclerosis of several previously lytic lesions.  4. Constellation of findings is consistent with treatment response  of metastatic disease  5. New, although age indeterminate pathologic wedge deformity of the  T6 vertebral body as well as an increased pathologic wedge deformity  of the L2 vertebral body.  6. Coronary artery disease.  Aortic Atherosclerosis (ICD10-I70.0).    01/26/2022 -  Chemotherapy   Patient is on Treatment Plan : BREAST METASTATIC Fam-Trastuzumab Deruxtecan-nxki (Enhertu) (5.4) q21d     Malignant neoplasm metastatic to liver ([place])  04/17/2021 Imaging   CT ABDOMEN AND PELVIS WITH CONTRAST: -New lytic lesion  involving the L2 vertebral body (6:72, 2:26) with minimal cortical breakthrough superiorly, but with preservation of vertebral body height, concerning for metastatic disease.  -There are several indeterminate hepatic lesions as described above. In addition to the lesions described above, there is an additional subtle 1.5 cm hypodensity in the hepatic dome (2:7). Given clinical history and presence of a new L2 lesion, leading differential consideration is metastatic disease. Recommend further evaluation with MRI of the abdomen with and without contrast.   05/12/2021 Imaging   MRI ABDOMEN WITH AND WITHOUT CONTRAST: Numerous heterogeneously enhancing, internally necrotic lesions in the right hepatic lobe, highly concerning for metastatic disease.  Largest lesion measures up to 6.7 cm in hepatic segment VI.   Enhancing osseous lesions in the L2 vertebral body, both iliac bones, and in the sacrum, highly concerning for osseous metastases.   05/15/2021 Initial Diagnosis   Liver metastases ([place])   05/27/2021 PET scan   1. Widespread recurrent/metastatic disease in this patient who is  status post bilateral mastectomy. Left chest wall recurrence with  left axillary/subpectoral, supraclavicular nodal metastasis.  2. Hepatic, left adrenal, and widespread osseous metastasis.  3. Incidental findings, including: Right nephrolithiasis. Tiny  hiatal hernia.    05/27/2021 Imaging   MRI LUMBAR AND THORACIC SPINE: Thoracic spine:  Osseous metastatic disease involving each level. The most dramatic deposits are at T4 and T6 where there is extraosseous/epidural tumor. No cord compression but there is foraminal impingement on the left at T6-7 and on the right at T3-4. Early dorsal epidural tumor at the level of T10 and T3.   Lumbar spine:  1. Widespread osseous metastatic disease with largest deposit  replacing the L2 body where there is a compression fracture with  mild height loss. Also at this level is early  epidural tumor  extension likely affecting both L2-3 foramina.  2. 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    05/27/2021 Imaging   MRI HEAD WITH AND WITHOUT CONTRAST: Negative for metastatic disease to the brain or calvarium.   06/03/2021 - 06/24/2021 Chemotherapy   Patient is on Treatment Plan : BREAST Trastuzumab q21d X 11 Cycles     06/25/2021 - 01/01/2022 Chemotherapy   Patient is on Treatment Plan : BREAST Docetaxel + Trastuzumab + Pertuzumab (THP) q21d     06/25/2021 - 01/02/2022 Chemotherapy   Patient is on Treatment Plan : BREAST Docetaxel + Trastuzumab + Pertuzumab (THP) q21d     09/14/2021 Imaging   CT chest/abdomen/pelvis  IMPRESSION:  1. Interval resolution of previously seen bulky left axillary and  subpectoral lymphadenopathy. No persistently enlarged lymph nodes.  2. Multiple liver lesions are significantly diminished in size.  3. Widespread osseous metastatic disease, with an interval increase  in sclerosis of several previously lytic lesions.  4. Constellation of findings is consistent with treatment response  of metastatic disease  5. New, although age indeterminate pathologic wedge deformity of the  T6 vertebral body as well as an increased pathologic wedge deformity  of the L2 vertebral body.  6. Coronary artery disease.  Aortic Atherosclerosis (ICD10-I70.0).    01/26/2022 -  Chemotherapy   Patient is on Treatment Plan : BREAST METASTATIC Fam-Trastuzumab Deruxtecan-nxki (Enhertu) (5.4) q21d     Malignant neoplasm metastatic to bone ([place])  04/17/2021 Imaging   CT ABDOMEN AND PELVIS WITH CONTRAST: -New lytic lesion involving the L2 vertebral body (6:72, 2:26) with minimal cortical breakthrough superiorly, but with preservation of vertebral body height, concerning for metastatic disease.  -There are several indeterminate hepatic lesions as described above. In addition to the lesions described above, there is an additional subtle 1.5 cm  hypodensity in the hepatic dome (2:7). Given clinical history and presence of a new L2 lesion, leading differential consideration is metastatic disease. Recommend further evaluation with MRI of the abdomen with and without contrast.   05/12/2021 Imaging   MRI ABDOMEN WITH AND WITHOUT CONTRAST: Numerous heterogeneously enhancing, internally necrotic lesions in the right hepatic lobe, highly concerning for metastatic disease. Largest lesion measures up to 6.7 cm in hepatic segment VI.   Enhancing osseous lesions in the L2 vertebral body, both iliac bones, and in the  sacrum, highly concerning for osseous metastases.   05/15/2021 Initial Diagnosis   Bone metastases ([place])   05/27/2021 PET scan   1. Widespread recurrent/metastatic disease in this patient who is  status post bilateral mastectomy. Left chest wall recurrence with  left axillary/subpectoral, supraclavicular nodal metastasis.  2. Hepatic, left adrenal, and widespread osseous metastasis.  3. Incidental findings, including: Right nephrolithiasis. Tiny  hiatal hernia.    05/27/2021 Imaging   MRI LUMBAR AND THORACIC SPINE: Thoracic spine:  Osseous metastatic disease involving each level. The most dramatic deposits are at T4 and T6 where there is extraosseous/epidural tumor. No cord compression but there is foraminal impingement on the left at T6-7 and on the right at T3-4. Early dorsal epidural tumor at the level of T10 and T3.   Lumbar spine:  1. Widespread osseous metastatic disease with largest deposit  replacing the L2 body where there is a compression fracture with  mild height loss. Also at this level is early epidural tumor  extension likely affecting both L2-3 foramina.  2. 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    05/27/2021 Imaging   MRI HEAD WITH AND WITHOUT CONTRAST: Negative for metastatic disease to the brain or calvarium.   06/03/2021 - 06/24/2021 Chemotherapy   Patient is on Treatment Plan  : BREAST Trastuzumab q21d X 11 Cycles     06/25/2021 - 01/01/2022 Chemotherapy   Patient is on Treatment Plan : BREAST Docetaxel + Trastuzumab + Pertuzumab (THP) q21d     06/25/2021 - 01/02/2022 Chemotherapy   Patient is on Treatment Plan : BREAST Docetaxel + Trastuzumab + Pertuzumab (THP) q21d     09/14/2021 Imaging   CT chest/abdomen/pelvis  IMPRESSION:  1. Interval resolution of previously seen bulky left axillary and  subpectoral lymphadenopathy. No persistently enlarged lymph nodes.  2. Multiple liver lesions are significantly diminished in size.  3. Widespread osseous metastatic disease, with an interval increase  in sclerosis of several previously lytic lesions.  4. Constellation of findings is consistent with treatment response  of metastatic disease  5. New, although age indeterminate pathologic wedge deformity of the  T6 vertebral body as well as an increased pathologic wedge deformity  of the L2 vertebral body.  6. Coronary artery disease.  Aortic Atherosclerosis (ICD10-I70.0).    01/26/2022 -  Chemotherapy   Patient is on Treatment Plan : BREAST METASTATIC Fam-Trastuzumab Deruxtecan-nxki (Enhertu) (5.4) q21d         REVIEW OF SYSTEMS:   Review of Systems  Constitutional: Negative.   HENT:  Negative.    Eyes:  Positive for eye problems (tearing of the eye.).  Respiratory: Negative.    Cardiovascular: Negative.   Gastrointestinal:  Positive for constipation (occasionally).  Endocrine: Negative.   Genitourinary: Negative.    Musculoskeletal: Negative.   Skin: Negative.   Neurological:  Positive for numbness (severe paresthesias inability to use her left hand).  Hematological: Negative.   Psychiatric/Behavioral: Negative.        VITALS:  Blood pressure 138/61, pulse 80, temperature 98.4 F (36.9 C), temperature source Oral, resp. rate 18, height 5' 0.25" (1.53 m), weight 108 lb 8 oz (49.2 kg), SpO2 97 %.  Wt Readings from Last 3 Encounters:  05/27/22 106 lb  14.4 oz (48.5 kg)  05/11/22 109 lb (49.4 kg)  05/06/22 108 lb 8 oz (49.2 kg)    Body mass index is 21.01 kg/m.  Performance status (ECOG): 1 - Symptomatic but completely ambulatory  PHYSICAL EXAM:  Physical Exam Exam conducted  with a chaperone present.  Constitutional:      Appearance: Normal appearance. She is normal weight.  HENT:     Head: Normocephalic and atraumatic.     Nose: Nose normal.     Mouth/Throat:     Mouth: Mucous membranes are moist.     Pharynx: Oropharynx is clear.  Eyes:     Extraocular Movements: Extraocular movements intact.     Conjunctiva/sclera: Conjunctivae normal.     Pupils: Pupils are equal, round, and reactive to light.  Cardiovascular:     Rate and Rhythm: Normal rate and regular rhythm.     Pulses: Normal pulses.     Heart sounds: Normal heart sounds.  Pulmonary:     Effort: Pulmonary effort is normal.     Breath sounds: Normal breath sounds.  Chest:  Breasts:    Right: Normal.     Left: Absent.     Comments: Left mastectomy is negative but she has a 2 cm nodule adjacent to the left shoulder, which is softer and slightly smaller. The nodularity of the left upper chest wall is improved Her l\ Abdominal:     General: Bowel sounds are normal.     Palpations: Abdomen is soft.  Musculoskeletal:        General: Normal range of motion.     Cervical back: Normal range of motion.     Comments: Mild lymphedema of the left arm  Skin:    General: Skin is warm and dry.     Findings: Erythema (mild) present.  Neurological:     Mental Status: She is alert and oriented to person, place, and time.     Motor: Weakness (She has severe paresthesias of the left upper extremity with inability to use her left hand) present.  Psychiatric:        Mood and Affect: Mood normal.        Behavior: Behavior normal.        Thought Content: Thought content normal.        Judgment: Judgment normal.         LABORATORY DATA:  I have reviewed the data as  listed    Component Value Date/Time   NA 136 (A) 05/27/2022 0000   NA 137 11/13/2012 1057   K 4.0 05/27/2022 0000   K 4.5 11/13/2012 1057   CL 105 05/27/2022 0000   CO2 27 (A) 05/27/2022 0000   CO2 26 11/13/2012 1057   GLUCOSE 103 (H) 05/06/2022 1002   GLUCOSE 90 11/13/2012 1057   BUN 20 05/27/2022 0000   BUN 18.3 11/13/2012 1057   CREATININE 0.6 05/27/2022 0000   CREATININE 0.53 05/06/2022 1002   CREATININE 0.8 11/13/2012 1057   CALCIUM 9.2 05/27/2022 0000   CALCIUM 9.7 11/13/2012 1057   PROT 6.2 (L) 05/06/2022 1002   PROT 7.4 11/13/2012 1057   ALBUMIN 3.7 05/27/2022 0000   ALBUMIN 3.9 11/13/2012 1057   AST 38 (A) 05/27/2022 0000   AST 28 05/06/2022 1002   AST 22 11/13/2012 1057   ALT 18 05/27/2022 0000   ALT 14 05/06/2022 1002   ALT 15 11/13/2012 1057   ALKPHOS 53 05/27/2022 0000   ALKPHOS 77 11/13/2012 1057   BILITOT 0.6 05/06/2022 1002   BILITOT 0.54 11/13/2012 1057   GFRNONAA >60 05/06/2022 1002   GFRAA >60 04/25/2019 1045    No results found for: "SPEP", "UPEP"  Lab Results  Component Value Date   WBC 4.8 05/27/2022   NEUTROABS 2.64 05/27/2022  HGB 13.0 05/27/2022   HCT 38 05/27/2022   MCV 100 (A) 04/15/2022   PLT 163 05/27/2022      Chemistry      Component Value Date/Time   NA 136 (A) 05/27/2022 0000   NA 137 11/13/2012 1057   K 4.0 05/27/2022 0000   K 4.5 11/13/2012 1057   CL 105 05/27/2022 0000   CO2 27 (A) 05/27/2022 0000   CO2 26 11/13/2012 1057   BUN 20 05/27/2022 0000   BUN 18.3 11/13/2012 1057   CREATININE 0.6 05/27/2022 0000   CREATININE 0.53 05/06/2022 1002   CREATININE 0.8 11/13/2012 1057   GLU 96 05/27/2022 0000      Component Value Date/Time   CALCIUM 9.2 05/27/2022 0000   CALCIUM 9.7 11/13/2012 1057   ALKPHOS 53 05/27/2022 0000   ALKPHOS 77 11/13/2012 1057   AST 38 (A) 05/27/2022 0000   AST 28 05/06/2022 1002   AST 22 11/13/2012 1057   ALT 18 05/27/2022 0000   ALT 14 05/06/2022 1002   ALT 15 11/13/2012 1057   BILITOT  0.6 05/06/2022 1002   BILITOT 0.54 11/13/2012 1057       RADIOGRAPHIC STUDIES: No results found. EXAM:12/07/21 MRI OF THE LEFT HUMERUS WITHOUT AND WITH CONTRAST IMPRESSION: Changes of marrow reconversion without evidence of aggressive focal bone lesion in left humerus. Skin thickening and soft tissue swelling of the left upper extremity consistent with lymphedema in the setting of prior left axillary lymph node dissection.

## 2022-04-26 ENCOUNTER — Ambulatory Visit: Payer: Medicare Other | Attending: Oncology | Admitting: Occupational Therapy

## 2022-04-26 DIAGNOSIS — M25642 Stiffness of left hand, not elsewhere classified: Secondary | ICD-10-CM | POA: Insufficient documentation

## 2022-04-26 DIAGNOSIS — I972 Postmastectomy lymphedema syndrome: Secondary | ICD-10-CM | POA: Insufficient documentation

## 2022-04-26 DIAGNOSIS — M6281 Muscle weakness (generalized): Secondary | ICD-10-CM | POA: Diagnosis present

## 2022-04-26 NOTE — Therapy (Signed)
[facility] [address], [place], [address] Phone: [phone]   Fax:  [phone]  Occupational Therapy Treatment  Patient Details  Name: Jasmin Lewis MRN: [id number] Date of Birth: 03-26-1932 Referring Provider (OT): Elliot Cousin   Encounter Date: 04/26/2022   OT End of Session - 04/26/22 1346     Visit Number 9    Number of Visits 13    Date for OT Re-Evaluation 06/21/22    OT Start Time 1347    OT Stop Time 1433    OT Time Calculation (min) 46 min    Activity Tolerance Patient tolerated treatment well    Behavior During Therapy [facility] for tasks assessed/performed             Past Medical History:  Diagnosis Date   Arthritis    shoulder   Breast cancer ([place])    Cancer ([place])    left breast ca   Colon cancer ([place])    [name], stage II 11/12/2011   Sigmoid lesion 5.9 cm  40 nodes negative but focus of cancer in a diverticum   Pre-op CEA 5.2 with lab normal up to 2.5 resected 09/21/05   Xeloda adjuvant chemotherapy   Hypertension     Past Surgical History:  Procedure Laterality Date   COLONOSCOPY     HEMICOLECTOMY  2007   MASTECTOMY WITH AXILLARY LYMPH NODE DISSECTION Left 03/14/2019   Procedure: LEFT MASTECTOMY WITH TARGETED LYMPH NODE DISSECTION;  Surgeon: Jovita Kussmaul, MD;  Location: [facility];  Service: General;  Laterality: Left;   SENTINEL NODE BIOPSY Left 03/14/2019   Procedure: Left Axillary Sentinel Lymph  Node Biopsy;  Surgeon: Jovita Kussmaul, MD;  Location: [facility];  Service: General;  Laterality: Left;   TONSILLECTOMY     WISDOM TOOTH EXTRACTION      There were no vitals filed for this visit.   Subjective Assessment - 04/26/22 1346     Subjective  Doing okay but my arm feels little more puffy at the elbow and forearm - chemo DR think is working will have another scan in January- my  fingers are just tight , stiff and not working. But look I can turn myu palm down now.  THe compression sleeve at night time keeps my fingers straight    Pertinent History Malignant neoplasm of upper-outer quadrant of left breast in female, estrogen receptor negative ([place])  Stage IIB (T2c N1 M0) HER2 receptor positive left breast cancer, diagnosed in June 2020. This was ER/PR negative. This was treated with neoadjuvant Kadcyla/Perjeta and left mastectomy, with a complete response in breast and nodes. She had only received two doses of adjuvant Herceptin before being discontinued in December 2020.     History of colon cancer, stage II  History of stage II colon cancer, diagnosed in May 2007. This was treated with surgical resection and adjuvant chemotherapy Malignant neoplasm metastatic to liver [facility])  Liver metastases discovered 04/17/21.  Numerous heterogeneously enhancing, internally necrotic lesions in the right hepatic lobe, consistent with metastatic disease. Largest lesion measures up to 6.7 cm and there are at least 6 lesions. We now have biopsy proven HER2 positive recurrent breast cancer with ER/PR negative. She is receiving THP.CT scan now shows excellent response with significant decrease in the size of her liver lesions.     Malignant neoplasm metastatic to bone [facility])  Bone metastases discovered 04/17/21. Enhancing osseous lesions are present in the L2 vertebral body,  both iliac bones, and in the sacrum, highly concerning for osseous metastases. She received her 1st dose of zoledronic acid on January 16th.  I once again today explained to her and her son the reasoning for this medicine.  Her widespread bone metastases show an interval increase in sclerosis of previously lytic lesions consistent with treatment response.     Chest wall recurrence of breast cancer, left ([name])  Left supraclavicular lymph node measuring 2-3 cm. Also involvement of subpectoral node and chest wall skin. This was rapidly progressive despite receiving  trastuzumab.  We have added in Perjeta and Taxotere to her  current regimen and this has been very effective.  The CT scan also shows good response of her adenopathy with resolution of the previous bulky left axillary and subpectoral adenopathy. Today, I do not appreciate axillary nodes. However, she does have a 2 cm nodule noted to the left chest that is new. This is located close to the left shoulder. Of note, she does have existing lymphedema to the left arm; however, this is worse today, most likely from a bee sting she suffered to the left hand. Her arm appears red and is warm to touch. We will treat this with antibiotics.  Refer to lymphedema clinic.    Patient Stated Goals I just want to see if I can get the swelling better in my left arm and hand, in the motion, strength and use of my left dominant hand.    Currently in Pain? Yes    Pain Score --   fingers   Pain Orientation Left    Pain Descriptors / Indicators Aching;Tightness;Sore;Tender;Numbness    Pain Type Chronic pain;Acute pain                 LYMPHEDEMA/ONCOLOGY QUESTIONNAIRE - 04/26/22 0001       Left Upper Extremity Lymphedema   15 cm Proximal to Olecranon Process 27 cm    10 cm Proximal to Olecranon Process 27 cm    Olecranon Process 24.5 cm    15 cm Proximal to Ulnar Styloid Process 22 cm    10 cm Proximal to Ulnar Styloid Process 19 cm    Just Proximal to Ulnar Styloid Process 15.1 cm    Across Hand at [employer] 17.1 cm                  Pt at night time now in Circaid sleeve  on L UE- isotoner glove in combination -and loosen at night - tighten during day. Rep mailing hand piece - Check next time if got it. Pt elbow and prox forearm increase since last time Pt to bring in next time to reassess fit -and hand piece She arrive for OT to done old compression sleeve when leaving         Pt with increase stiffness and pain with PROM in digits        Patient cont to show  great decongestion of L UE. -Allowing OT to be able to focus more on range of motion  to L hand and wrist and contrast /moist heat in session.  Done this date 3 times a light digit flexion compression wrap with coban Patient kept on 2 minutes at a time with moist heat.   Progress well but not as great PROM as last few sessions- less MC flexion and composite flexion PROM      Pt cont to have numbness in all digits -with 3rd worse and med side of  4th - CTS? And other nerve involve with decrease wrist strength and pronation limited  This date with increase pronation AROM  Review with pt to do AROM for wrist flexion, not just extention 10 reps And RD/UD 10 reps  2 sets  But unable to maintain progress with PROM  of digits   Recommend Neurology or Ortho consult for nerve conduction test at this time -appear pt has referral into ortho           Done several reps and sets of passive range of motion to [facility] flexion as well as PIP flexion as well as composite flexion and extension of digits.  Done several times of reps and sets of placed on hold for composite fist.  Patient do fatigue easily. But showed increase reps and sets compare to in past      Encouraged patient and son to do range of motion home exercises 3-5 x day to keep it flexible to be able to activate more active range of motion.      HEP for AAROM and PROM o digits MC flexion, PIP /intrinsic fist  and composite flexion- PROM and place and hold  AROM in shoulder and elbow WNL       Patient to follow-up with me in 2-3 wks to  reassess                   OT Education - 04/26/22 1346     Education Details progress and changes to HEP    Person(s) Educated Patient;Child(ren)    Methods Explanation;Demonstration;Tactile cues;Verbal cues;Handout    Comprehension Verbal cues required;Returned demonstration;Verbalized understanding                 OT Long Term Goals - 01/28/22 2117       OT LONG TERM GOAL #1   Title Patient and son demo understanding to do home program to decrease circumference in  left upper extremity to get measured for appropriate compression garment for home use.    Baseline No knowledge of bandaging and safely donning compression garment because of weakness in digits and wrist extension.  As well as flexion.    Time 4    Period Weeks    Status New    Target Date 02/25/22      OT LONG TERM GOAL #2   Title Left upper extremity circumference decreased by 2 cm in forearm and upper arm and 1 cm in hand and forearm to be fitted for appropriate compression garment to use safely at home to prevent infection.    Baseline No knowledge of home program to maintain circumference and prevent infection.    Time 6    Period Weeks    Status New    Target Date 03/11/22      OT LONG TERM GOAL #3   Title Patient show increase  L forearm pronation, wrist flexion and digits flexion in left arm to initiate use of left hand and ADLs.    Baseline Patient unable to use left hand and bathing and dressing or any ADLs unable to initiate digit flexion to make a fist or pinch.    Time 8    Period Weeks    Status New    Target Date 03/25/22                   Plan - 04/26/22 1347     Clinical Impression Statement Patient presented OT evaluation with a diagnosis of left upper extremity  lymphedema.  Patient report lymphedema started about December 2022.  Was fitted with a compression sleeve.  But it got worse gradually with hand worsening after being stung by a wasp on the left hand few months after that.. Chest wall recurrence of breast cancer, left ([name])  Left supraclavicular lymph node measuring 2-3 cm. Also involvement of subpectoral node and chest wall skin. This was rapidly progressive despite receiving  trastuzumab.  They added in Perjeta and Taxotere to her current regimen and this has been very effective.  The CT scan also shows good response of her adenopathy with resolution of the previous bulky left axillary and subpectoral adenopathy.  With 2 cm nodule noted to the left  chest that is new. This is located close to the left shoulder.  Patient was treated with antibiotics after wasp sting.  Patient is left-hand dominant -Pt made great progress in lymphdema management in L UE - and fitted last time with Circaid compression that she can manage herself day and night time but cont to have stiffness and numbness in L hand , wrist and pronation. Appear CTS but also wrist and pronation weakness but this date pt with increase pronation. Pt did get referral for nerve Conduction test at ortho because  of numbness - and pt not getting any carry over from session in sensation and AROM - pt had increase stiffness and pain this date with PROM of digits. She is able to maintain 60-70 degrees of flexion in session when place and hold. Numbness in all digits with 3rd and 4th worse. Pt to cont with Circaid sleeve - Rep will mail hand piece - pt to wear isotoner glove.  Patient  to follow up with me in 2-3 wks to assess if can maintain progress and if was able to get schedule for nerve conduction test. Pt can benefit from skilled OT services for lymphedema management and increased range of motion/strength if possible to increase functional use of left dominant hand for quality of life performing ADLs.    OT Occupational Profile and History Problem Focused Assessment - Including review of records relating to presenting problem    Occupational performance deficits (Please refer to evaluation for details): ADL's;IADL's;Play;Leisure;Social Participation    Body Structure / Function / Physical Skills ADL;Coordination;Sensation;Flexibility;IADL;ROM;Edema;Dexterity;Strength;Decreased knowledge of precautions    Rehab Potential Fair    Clinical Decision Making Several treatment options, min-mod task modification necessary    Comorbidities Affecting Occupational Performance: May have comorbidities impacting occupational performance    Modification or Assistance to Complete Evaluation  Min-Moderate  modification of tasks or assist with assess necessary to complete eval    OT Frequency Biweekly    OT Duration 8 weeks    OT Treatment/Interventions Self-care/ADL training;Manual lymph drainage;Compression bandaging;Therapeutic exercise;Contrast Bath;Manual Therapy;Patient/family education;Passive range of motion    Consulted and Agree with Plan of Care Patient;Family member/caregiver             Patient will benefit from skilled therapeutic intervention in order to improve the following deficits and impairments:   Body Structure / Function / Physical Skills: ADL, Coordination, Sensation, Flexibility, IADL, ROM, Edema, Dexterity, Strength, Decreased knowledge of precautions       Visit Diagnosis: Postmastectomy lymphedema syndrome  Muscle weakness (generalized)  Stiffness of left hand, not elsewhere classified    Problem List Patient Active Problem List   Diagnosis Date Noted   Lymphedema of left upper extremity 02/05/2022    Class: Chronic   Secondary malignant neoplasm of bone and bone marrow (  [place]) 09/07/2021   Chest wall recurrence of breast cancer, left ([place]) 06/24/2021    Class: Diagnosis of   Dehydration 05/25/2021   Malignant neoplasm metastatic to liver ([place]) 05/15/2021    Class: Diagnosis of   Malignant neoplasm metastatic to bone ([place]) 05/15/2021    Class: Acute   Hypercalcemia 05/15/2021    Class: Acute   Brachial plexus injury, left 04/27/2021    Class: Chronic   Malignant neoplasm of upper-outer quadrant of left breast in female, estrogen receptor negative ([place]) 11/02/2018   Colon polyps 05/19/2018    Class: History of   History of colon cancer, stage II 11/12/2011    Rosalyn Gess, OTR/L,CLT 04/26/2022, 4:33 PM  [facility] [address] [place], [address] Phone: [phone]   Fax:  [phone]  Name: Jasmin Lewis MRN: [id number] Date of Birth: 31-Jan-1932

## 2022-05-06 ENCOUNTER — Encounter: Payer: Self-pay | Admitting: Oncology

## 2022-05-06 ENCOUNTER — Inpatient Hospital Stay: Payer: Medicare Other

## 2022-05-06 ENCOUNTER — Inpatient Hospital Stay (INDEPENDENT_AMBULATORY_CARE_PROVIDER_SITE_OTHER): Payer: Medicare Other | Admitting: Oncology

## 2022-05-06 VITALS — BP 138/61 | HR 80 | Temp 98.4°F | Resp 18 | Ht 60.25 in | Wt 108.5 lb

## 2022-05-06 DIAGNOSIS — Z171 Estrogen receptor negative status [ER-]: Secondary | ICD-10-CM

## 2022-05-06 DIAGNOSIS — C50912 Malignant neoplasm of unspecified site of left female breast: Secondary | ICD-10-CM

## 2022-05-06 DIAGNOSIS — C787 Secondary malignant neoplasm of liver and intrahepatic bile duct: Secondary | ICD-10-CM | POA: Diagnosis not present

## 2022-05-06 DIAGNOSIS — Z5112 Encounter for antineoplastic immunotherapy: Secondary | ICD-10-CM | POA: Diagnosis not present

## 2022-05-06 DIAGNOSIS — C7989 Secondary malignant neoplasm of other specified sites: Secondary | ICD-10-CM

## 2022-05-06 DIAGNOSIS — C50412 Malignant neoplasm of upper-outer quadrant of left female breast: Secondary | ICD-10-CM | POA: Diagnosis not present

## 2022-05-06 DIAGNOSIS — C7951 Secondary malignant neoplasm of bone: Secondary | ICD-10-CM | POA: Diagnosis not present

## 2022-05-06 LAB — CMP (CANCER CENTER ONLY)
ALT: 14 U/L (ref 0–44)
AST: 28 U/L (ref 15–41)
Albumin: 3.5 g/dL (ref 3.5–5.0)
Alkaline Phosphatase: 50 U/L (ref 38–126)
Anion gap: 5 (ref 5–15)
BUN: 19 mg/dL (ref 8–23)
CO2: 24 mmol/L (ref 22–32)
Calcium: 8.9 mg/dL (ref 8.9–10.3)
Chloride: 109 mmol/L (ref 98–111)
Creatinine: 0.53 mg/dL (ref 0.44–1.00)
GFR, Estimated: >60 mL/min (ref >60)
Glucose, Bld: 103 mg/dL — ABNORMAL HIGH (ref 70–99)
Potassium: 4.2 mmol/L (ref 3.5–5.1)
Sodium: 138 mmol/L (ref 135–145)
Total Bilirubin: 0.6 mg/dL (ref 0.3–1.2)
Total Protein: 6.2 g/dL — ABNORMAL LOW (ref 6.5–8.1)

## 2022-05-06 LAB — CBC AND DIFFERENTIAL
HCT: 36 (ref 36–46)
Hemoglobin: 12.5 (ref 12.0–16.0)
Neutrophils Absolute: 2.03
Platelets: 153 10*3/uL (ref 150–400)
WBC: 3.9

## 2022-05-06 LAB — CBC: RBC: 3.57 — AB (ref 3.87–5.11)

## 2022-05-06 LAB — CBC W DIFFERENTIAL (~~LOC~~ CC SCANNED REPORT)

## 2022-05-07 MED FILL — Fam-Trastuzumab Deruxtecan-nxki For IV Soln 100 MG: INTRAVENOUS | Qty: 10 | Status: AC

## 2022-05-07 MED FILL — Dexamethasone Sodium Phosphate Inj 100 MG/10ML: INTRAMUSCULAR | Qty: 1 | Status: AC

## 2022-05-11 ENCOUNTER — Inpatient Hospital Stay: Payer: Medicare Other | Attending: Oncology

## 2022-05-11 VITALS — BP 129/74 | HR 78 | Temp 98.0°F | Resp 18 | Wt 109.0 lb

## 2022-05-11 DIAGNOSIS — C778 Secondary and unspecified malignant neoplasm of lymph nodes of multiple regions: Secondary | ICD-10-CM | POA: Insufficient documentation

## 2022-05-11 DIAGNOSIS — R200 Anesthesia of skin: Secondary | ICD-10-CM | POA: Insufficient documentation

## 2022-05-11 DIAGNOSIS — R600 Localized edema: Secondary | ICD-10-CM | POA: Diagnosis not present

## 2022-05-11 DIAGNOSIS — C171 Malignant neoplasm of jejunum: Secondary | ICD-10-CM | POA: Diagnosis not present

## 2022-05-11 DIAGNOSIS — C9012 Plasma cell leukemia in relapse: Secondary | ICD-10-CM | POA: Diagnosis not present

## 2022-05-11 DIAGNOSIS — C787 Secondary malignant neoplasm of liver and intrahepatic bile duct: Secondary | ICD-10-CM | POA: Insufficient documentation

## 2022-05-11 DIAGNOSIS — Z85038 Personal history of other malignant neoplasm of large intestine: Secondary | ICD-10-CM | POA: Insufficient documentation

## 2022-05-11 DIAGNOSIS — C7951 Secondary malignant neoplasm of bone: Secondary | ICD-10-CM | POA: Insufficient documentation

## 2022-05-11 DIAGNOSIS — K5900 Constipation, unspecified: Secondary | ICD-10-CM | POA: Diagnosis not present

## 2022-05-11 DIAGNOSIS — Z5112 Encounter for antineoplastic immunotherapy: Secondary | ICD-10-CM | POA: Insufficient documentation

## 2022-05-11 DIAGNOSIS — Z171 Estrogen receptor negative status [ER-]: Secondary | ICD-10-CM

## 2022-05-11 DIAGNOSIS — C50412 Malignant neoplasm of upper-outer quadrant of left female breast: Secondary | ICD-10-CM | POA: Insufficient documentation

## 2022-05-11 DIAGNOSIS — I890 Lymphedema, not elsewhere classified: Secondary | ICD-10-CM | POA: Insufficient documentation

## 2022-05-11 MED ORDER — ZOLEDRONIC ACID 4 MG/5ML IV CONC
3.0000 mg | Freq: Once | INTRAVENOUS | Status: AC
  Administered 2022-05-11: 3 mg via INTRAVENOUS
  Filled 2022-05-11: qty 3.75

## 2022-05-11 MED ORDER — DIPHENHYDRAMINE HCL 25 MG PO CAPS
50.0000 mg | ORAL_CAPSULE | Freq: Once | ORAL | Status: AC
  Administered 2022-05-11: 50 mg via ORAL
  Filled 2022-05-11: qty 2

## 2022-05-11 MED ORDER — FAM-TRASTUZUMAB DERUXTECAN-NXKI CHEMO 100 MG IV SOLR
3.9500 mg/kg | Freq: Once | INTRAVENOUS | Status: AC
  Administered 2022-05-11: 200 mg via INTRAVENOUS
  Filled 2022-05-11: qty 10

## 2022-05-11 MED ORDER — SODIUM CHLORIDE 0.9 % IV SOLN
10.0000 mg | Freq: Once | INTRAVENOUS | Status: AC
  Administered 2022-05-11: 10 mg via INTRAVENOUS
  Filled 2022-05-11: qty 10

## 2022-05-11 MED ORDER — DEXTROSE 5 % IV SOLN: Freq: Once | INTRAVENOUS | Status: DC

## 2022-05-11 MED ORDER — SODIUM CHLORIDE 0.9 % IV SOLN: Freq: Once | INTRAVENOUS | Status: DC

## 2022-05-11 MED ORDER — SODIUM CHLORIDE 0.9% FLUSH: 10.0000 mL | INTRAVENOUS | Status: DC | PRN

## 2022-05-11 MED ORDER — HEPARIN SOD (PORK) LOCK FLUSH 100 UNIT/ML IV SOLN: 500.0000 [IU] | Freq: Once | INTRAVENOUS | Status: DC | PRN

## 2022-05-11 MED ORDER — ACETAMINOPHEN 325 MG PO TABS
650.0000 mg | ORAL_TABLET | Freq: Once | ORAL | Status: AC
  Administered 2022-05-11: 650 mg via ORAL
  Filled 2022-05-11: qty 2

## 2022-05-11 MED ORDER — PALONOSETRON HCL INJECTION 0.25 MG/5ML
0.2500 mg | Freq: Once | INTRAVENOUS | Status: AC
  Administered 2022-05-11: 0.25 mg via INTRAVENOUS
  Filled 2022-05-11: qty 5

## 2022-05-11 NOTE — Patient Instructions (Signed)
Zoledronic Acid Injection (Cancer) What is this medication? ZOLEDRONIC ACID (ZOE le dron ik AS id) treats high calcium levels in the blood caused by cancer. It may also be used with chemotherapy to treat weakened bones caused by cancer. It works by slowing down the release of calcium from bones. This lowers calcium levels in your blood. It also makes your bones stronger and less likely to break (fracture). It belongs to a group of medications called bisphosphonates. This medicine may be used for other purposes; ask your health care provider or pharmacist if you have questions. COMMON BRAND NAME(S): Zometa, Zometa Powder What should I tell my care team before I take this medication? They need to know if you have any of these conditions: Dehydration Dental disease Kidney disease Liver disease Low levels of calcium in the blood Lung or breathing disease, such as asthma Receiving steroids, such as dexamethasone or prednisone An unusual or allergic reaction to zoledronic acid, other medications, foods, dyes, or preservatives Pregnant or trying to get pregnant Breast-feeding How should I use this medication? This medication is injected into a vein. It is given by your care team in a hospital or clinic setting. Talk to your care team about the use of this medication in children. Special care may be needed. Overdosage: If you think you have taken too much of this medicine contact a poison control center or emergency room at once. NOTE: This medicine is only for you. Do not share this medicine with others. What if I miss a dose? Keep appointments for follow-up doses. It is important not to miss your dose. Call your care team if you are unable to keep an appointment. What may interact with this medication? Certain antibiotics given by injection Diuretics, such as bumetanide, furosemide NSAIDs, medications for pain and inflammation, such as ibuprofen or naproxen Teriparatide Thalidomide This list  may not describe all possible interactions. Give your health care provider a list of all the medicines, herbs, non-prescription drugs, or dietary supplements you use. Also tell them if you smoke, drink alcohol, or use illegal drugs. Some items may interact with your medicine. What should I watch for while using this medication? Visit your care team for regular checks on your progress. It may be some time before you see the benefit from this medication. Some people who take this medication have severe bone, joint, or muscle pain. This medication may also increase your risk for jaw problems or a broken thigh bone. Tell your care team right away if you have severe pain in your jaw, bones, joints, or muscles. Tell you care team if you have any pain that does not go away or that gets worse. Tell your dentist and dental surgeon that you are taking this medication. You should not have major dental surgery while on this medication. See your dentist to have a dental exam and fix any dental problems before starting this medication. Take good care of your teeth while on this medication. Make sure you see your dentist for regular follow-up appointments. You should make sure you get enough calcium and vitamin D while you are taking this medication. Discuss the foods you eat and the vitamins you take with your care team. Check with your care team if you have severe diarrhea, nausea, and vomiting, or if you sweat a lot. The loss of too much body fluid may make it dangerous for you to take this medication. You may need bloodwork while taking this medication. Talk to your care team if   you wish to become pregnant or think you might be pregnant. This medication can cause serious birth defects. What side effects may I notice from receiving this medication? Side effects that you should report to your care team as soon as possible: Allergic reactions--skin rash, itching, hives, swelling of the face, lips, tongue, or  throat Kidney injury--decrease in the amount of urine, swelling of the ankles, hands, or feet Low calcium level--muscle pain or cramps, confusion, tingling, or numbness in the hands or feet Osteonecrosis of the jaw--pain, swelling, or redness in the mouth, numbness of the jaw, poor healing after dental work, unusual discharge from the mouth, visible bones in the mouth Severe bone, joint, or muscle pain Side effects that usually do not require medical attention (report to your care team if they continue or are bothersome): Constipation Fatigue Fever Loss of appetite Nausea Stomach pain This list may not describe all possible side effects. Call your doctor for medical advice about side effects. You may report side effects to FDA at 1-800-FDA-1088. Where should I keep my medication? This medication is given in a hospital or clinic. It will not be stored at home. NOTE: This sheet is a summary. It may not cover all possible information. If you have questions about this medicine, talk to your doctor, pharmacist, or health care provider.  2023 [employer] Standard (2021-06-11 00:00:00) Fam-Trastuzumab Deruxtecan Injection What is this medication? FAM-TRASTUZUMAB DERUXTECAN (fam-tras TOOZ eu mab DER ux TEE kan) treats some types of cancer. It works by blocking a protein that causes cancer cells to grow and multiply. This helps to slow or stop the spread of cancer cells. This medicine may be used for other purposes; ask your health care provider or pharmacist if you have questions. COMMON BRAND NAME(S): ENHERTU What should I tell my care team before I take this medication? They need to know if you have any of these conditions: Heart disease Heart failure Infection, especially a viral infection, such as chickenpox, cold sores, or herpes Liver disease Lung or breathing disease, such as asthma or COPD An unusual or allergic reaction to fam-trastuzumab deruxtecan, other medications, foods, dyes,  or preservatives Pregnant or trying to get pregnant Breast-feeding How should I use this medication? This medication is injected into a vein. It is given by your care team in a hospital or clinic setting. A special MedGuide will be given to you before each treatment. Be sure to read this information carefully each time. Talk to your care team about the use of this medication in children. Special care may be needed. Overdosage: If you think you have taken too much of this medicine contact a poison control center or emergency room at once. NOTE: This medicine is only for you. Do not share this medicine with others. What if I miss a dose? It is important not to miss your dose. Call your care team if you are unable to keep an appointment. What may interact with this medication? Interactions are not expected. This list may not describe all possible interactions. Give your health care provider a list of all the medicines, herbs, non-prescription drugs, or dietary supplements you use. Also tell them if you smoke, drink alcohol, or use illegal drugs. Some items may interact with your medicine. What should I watch for while using this medication? Visit your care team for regular checks on your progress. Tell your care team if your symptoms do not start to get better or if they get worse. Your condition will be monitored carefully  while you are receiving this medication. Do not become pregnant while taking this medication or for 7 months after stopping it. Women should inform their care team if they wish to become pregnant or think they might be pregnant. Men should not father a child while taking this medication and for 4 months after stopping it. There is potential for serious side effects to an unborn child. Talk to your care team for more information. Do not breast-feed an infant while taking this medication or for 7 months after the last dose. This medication has caused decreased sperm counts in some  men. This may make it more difficult to father a child. Talk to your care team if you are concerned about your fertility. This medication may increase your risk to bruise or bleed. Call your care team if you notice any unusual bleeding. Be careful brushing or flossing your teeth or using a toothpick because you may get an infection or bleed more easily. If you have any dental work done, tell your dentist you are receiving this medication. This medication may cause dry eyes and blurred vision. If you wear contact lenses, you may feel some discomfort. Lubricating eye drops may help. See your care team if the problem does not go away or is severe. This medication may increase your risk of getting an infection. Call your care team for advice if you get a fever, chills, sore throat, or other symptoms of a cold or flu. Do not treat yourself. Try to avoid being around people who are sick. Avoid taking medications that contain aspirin, acetaminophen, ibuprofen, naproxen, or ketoprofen unless instructed by your care team. These medications may hide a fever. What side effects may I notice from receiving this medication? Side effects that you should report to your care team as soon as possible: Allergic reactions--skin rash, itching, hives, swelling of the face, lips, tongue, or throat Dry cough, shortness of breath or trouble breathing Infection--fever, chills, cough, sore throat, wounds that don't heal, pain or trouble when passing urine, general feeling of discomfort or being unwell Heart failure--shortness of breath, swelling of the ankles, feet, or hands, sudden weight gain, unusual weakness or fatigue Unusual bruising or bleeding Side effects that usually do not require medical attention (report these to your care team if they continue or are bothersome): Constipation Diarrhea Hair loss Muscle pain Nausea Vomiting This list may not describe all possible side effects. Call your doctor for medical  advice about side effects. You may report side effects to FDA at 1-800-FDA-1088. Where should I keep my medication? This medication is given in a hospital or clinic. It will not be stored at home. NOTE: This sheet is a summary. It may not cover all possible information. If you have questions about this medicine, talk to your doctor, pharmacist, or health care provider.  2023 [employer] Standard (2021-01-07 00:00:00)

## 2022-05-13 ENCOUNTER — Ambulatory Visit: Payer: Medicare Other | Admitting: Occupational Therapy

## 2022-05-20 ENCOUNTER — Ambulatory Visit: Payer: Medicare Other | Admitting: Occupational Therapy

## 2022-05-27 ENCOUNTER — Inpatient Hospital Stay (INDEPENDENT_AMBULATORY_CARE_PROVIDER_SITE_OTHER): Payer: Medicare Other | Admitting: Oncology

## 2022-05-27 ENCOUNTER — Encounter: Payer: Self-pay | Admitting: Oncology

## 2022-05-27 ENCOUNTER — Inpatient Hospital Stay: Payer: Medicare Other

## 2022-05-27 DIAGNOSIS — C787 Secondary malignant neoplasm of liver and intrahepatic bile duct: Secondary | ICD-10-CM | POA: Diagnosis not present

## 2022-05-27 DIAGNOSIS — Z171 Estrogen receptor negative status [ER-]: Secondary | ICD-10-CM | POA: Diagnosis not present

## 2022-05-27 DIAGNOSIS — C50412 Malignant neoplasm of upper-outer quadrant of left female breast: Secondary | ICD-10-CM | POA: Diagnosis not present

## 2022-05-27 DIAGNOSIS — C7951 Secondary malignant neoplasm of bone: Secondary | ICD-10-CM | POA: Diagnosis not present

## 2022-05-27 LAB — BASIC METABOLIC PANEL
BUN: 20 (ref 4–21)
CO2: 27 — AB (ref 13–22)
Chloride: 105 (ref 99–108)
Creatinine: 0.6 (ref 0.5–1.1)
Glucose: 96
Potassium: 4 mEq/L (ref 3.5–5.1)
Sodium: 136 — AB (ref 137–147)

## 2022-05-27 LAB — CBC AND DIFFERENTIAL
HCT: 38 (ref 36–46)
Hemoglobin: 13 (ref 12.0–16.0)
Neutrophils Absolute: 2.64
Platelets: 163 10*3/uL (ref 150–400)
WBC: 4.8

## 2022-05-27 LAB — HEPATIC FUNCTION PANEL
ALT: 18 U/L (ref 7–35)
AST: 38 — AB (ref 13–35)
Alkaline Phosphatase: 53 (ref 25–125)
Bilirubin, Total: 0.5

## 2022-05-27 LAB — COMPREHENSIVE METABOLIC PANEL
Albumin: 3.7 (ref 3.5–5.0)
Calcium: 9.2 (ref 8.7–10.7)

## 2022-05-27 LAB — CBC: RBC: 3.73 — AB (ref 3.87–5.11)

## 2022-05-27 NOTE — Progress Notes (Signed)
Patient Care Team: [name], MD as PCP - General (Family Medicine) [name], MD as Consulting Physician (Oncology)  Clinic Day: 05/27/22   Referring physician: [name], MD  ASSESSMENT & PLAN:   Assessment & Plan: Malignant neoplasm of upper-outer quadrant of left breast in female, estrogen receptor negative ([place]) Stage IIB (T2c N1 M0) HER2 receptor positive left breast cancer, diagnosed in June 2020. This was ER/PR negative. This was treated with neoadjuvant Kadcyla/Perjeta and left mastectomy, with a complete response in breast and nodes. She had only received two doses of adjuvant Herceptin before being discontinued in December 2020.   History of colon cancer, stage II History of stage II colon cancer, diagnosed in May 2007. This was treated with surgical resection and adjuvant chemotherapy with Xeloda. Colonoscopy from 2013 revealed 2 polyps which were resected and chronic diverticulosis.   Malignant neoplasm metastatic to liver [facility]) Liver metastases discovered 04/17/21.  Numerous heterogeneously enhancing, internally necrotic lesions in the right hepatic lobe, consistent with metastatic disease. Largest lesion measures up to 6.7 cm and there are at least 6 lesions. We now have biopsy proven HER2 positive recurrent breast cancer with ER/PR negative. She received THP and tolerated it without significant difficulty.  CT scan showed excellent response with significant decrease in the size of her liver lesions. She later had progression of this disease and was switched to Enhertu.    Malignant neoplasm metastatic to bone [facility]) Bone metastases discovered 04/17/21. Enhancing osseous lesions are present in the L2 vertebral body, both iliac bones, and in the sacrum, highly concerning for osseous metastases. She received her 1st dose of zoledronic acid on January 16th.  I once again today explained to her and her son the reasoning for this medicine.  Her widespread bone  metastases show an interval increase in sclerosis of previously lytic lesions consistent with treatment response.   Chest wall recurrence of breast cancer, left ([name]) Left supraclavicular lymph node measuring 2-3 cm. Also involvement of subpectoral node and chest wall skin. This was rapidly progressive despite receiving  trastuzumab.  We have added in Perjeta and Taxotere to her current regimen and this has been very effective.  The CT scan also shows good response of her adenopathy with resolution of the previous bulky left axillary and subpectoral adenopathy. In September of 2023, she had a 2 cm nodule  located close to the left shoulder associated with nodularity of the left upper chest wall and appeared to have obvious progression of disease again. She was changed to [facility] and is responding well.  Lymphedema She does have existing lymphedema to the left arm; however, this is better today, with significant edema of the hand as well. We will refer her to a lymphedema specialist and she requests one in [place], so we will arrange that. She sleeps with the hand and arm elevated.  She has decreased use of her left hand but I think this is due to more than just the lymphedema.  She has been referred to a neurologist and evaluation is underway.  Plan: She no longer has any visible or palpable disease. I will see her back in 3 weeks with CBC and CMP for her 8th cycle of Enhertu. The patient and her son understand the plans discussed today and are in agreement with them.  She knows to contact our office if she develops concerns prior to her next appointment.     [name], MD  [facility]  [place] [place] [address] Dept: [phone] Dept Fax: [phone]   Orders Placed This Encounter  Procedures   CBC and differential    This external order was created through the Results Console.   CBC    This external  order was created through the Results Console.   Basic metabolic panel    This external order was created through the Results Console.   Comprehensive metabolic panel    This external order was created through the Results Console.   Hepatic function panel    This external order was created through the Results Console.      CHIEF COMPLAINT:  CC: An 89 year old female with metastatic breast cancer   Current Treatment:  Planning Enhertu  INTERVAL HISTORY:  Jasmin Lewis is here today for repeat clinical assessment prior to her 7th cycle of Enhertu next Tuesday for metastatic breast cancer. She states she is doing fine and denies any complaints.She continues unable to use her left hand, and is distressed by this. Patients CBC and CMP currently look good. She denies fevers or chills. She denies pain. Her appetite is good. Her weight has decreased 3 pounds since her last visit.   I have reviewed the past medical history, past surgical history, social history and family history with the patient and they are unchanged from previous note.  ALLERGIES:  has No Known Allergies.  MEDICATIONS:  Current Outpatient Medications  Medication Sig Dispense Refill   Biotin 1 MG CAPS Take by mouth.     fish oil-omega-3 fatty acids 1000 MG capsule Take 1 g by mouth daily.     gabapentin (NEURONTIN) 100 MG capsule Take 1 capsule (100 mg total) by mouth at bedtime. (Patient not taking: Reported on 03/05/2022) 30 capsule 5   magnesium citrate SOLN Take 0.5 Bottles by mouth as needed for severe constipation.     Multiple Vitamin (MULTIVITAMIN) capsule Take 1 capsule by mouth daily.     ondansetron (ZOFRAN) 4 MG tablet Take 1 tablet (4 mg total) by mouth every 8 (eight) hours as needed for nausea or vomiting. (Patient not taking: Reported on 03/05/2022) 20 tablet 2   polyethylene glycol (MIRALAX / GLYCOLAX) 17 g packet Take 17 g by mouth as needed. constipation     No current facility-administered medications for this  visit.    HISTORY OF PRESENT ILLNESS:   Oncology History Overview Note  Cancer Staging Malignant neoplasm of upper-outer quadrant of left breast in female, estrogen receptor negative ([place]) Staging form: Breast, AJCC 8th Edition - Clinical stage from 10/11/2018: Stage IIB (cT2, cN1, cM0, G3, ER-, PR-, HER2+) - Signed by Truitt Merle, MD on 11/02/2018 - Clinical: No stage assigned - Unsigned    History of colon cancer, stage II  09/2005 Initial Diagnosis   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 Findings were a 5.9 x 3.8 x 1.2 cm moderate to well-differentiated adenocarcinoma penetrating the muscular wall. Forty lymph nodes negative. No vascular or lymphatic invasion. Multiple diverticula with microabscess formation and inflammation. Carcinoma present in at least 1 diverticulum. Preop CEA 5.2 with lab normal range 0 to 2.5. Preop CT scan with no obvious additional pathology.    2007 -  Chemotherapy   She received oral Xeloda chemotherapy as an adjuvant. She declined treatment on a clinical trial.    11/12/2011 Initial Diagnosis   [name], stage II  12/09/2011 Procedure   Followup colonoscopy done on 12/09/2011. She was found to have 2 polyps which were removed. Chronic diverticulosis.     Malignant neoplasm of upper-outer quadrant of left breast in female, estrogen receptor negative ([place])  09/26/2018 Mammogram   Mammogram/US of left breast 09/26/18 IMPRESSION:  1. 2.9cm irregular mass in the upper outer left breast corresponds to the palpable abnormality. This is highly suspicious for breast carcinoma.  2. Two adjacent abnormal left axillary  Lymph nodes suspicious for metastatic adenopathy. There is a third borderline abnormal left axillary LN with a cortex thickened to 54m.  3. Benign right breast cyst. No evidence of right breast malignancy.    10/11/2018 Cancer Staging   Staging form:  Breast, AJCC 8th Edition - Clinical stage from 10/11/2018: Stage IIB (cT2, cN1, cM0, G3, ER-, PR-, HER2+) - Signed by [name] MD on 11/02/2018   10/11/2018 Initial Biopsy   Diagnosis 10/11/18 1. Breast, left, needle core biopsy, upper outer left 2 o'clock - INVASIVE DUCTAL CARCINOMA. - DUCTAL CARCINOMA IN SITU. - LYMPHOVASCULAR INVASION IS IDENTIFIED. - SEE COMMENT. 2. Lymph node, needle/core biopsy, left axilla - INVASIVE DUCTAL CARCINOMA. - SEE COMMENT.   10/11/2018 Receptors her2   The tumor cells are POSITIVE for Her2 (3+). Estrogen Receptor: 0%, NEGATIVE Progesterone Receptor: 0%, NEGATIVE Proliferation Marker Ki67: 70%   11/02/2018 Initial Diagnosis   Malignant neoplasm of upper-outer quadrant of left breast in female, estrogen receptor negative ([place]   11/15/2018 Breast MRI   MRI breast 11/15/18  IMPRESSION: 1. 3.3 centimeter mass in the LATERAL portion of the LEFT breast consistent with known malignancy. 2. There is significant non mass enhancement surrounding this mass and extending anteriorly into the nipple base, with largest diameter in the anterior to posterior axis, measuring 7.2 centimeters. 3. If the patient would consider breast conservation, additional MR guided core biopsies are recommended. Consider biopsy of the inferior and anterior extent of the non mass enhancement to document extent of disease. 4. Three enlarged LEFT axillary lymph nodes. 5. RIGHT breast is negative.   11/15/2018 PET scan   PET 11/15/18 IMPRESSION: Hypermetabolic left breast lesion compatible with known primary. Hypermetabolic left axillary lymph nodes are consistent with metastatic disease.   No evidence for additional hypermetabolic metastatic involvement in the neck, chest, abdomen, or pelvis.   11/17/2018 - 03/01/2019 Chemotherapy   Neo-adjuvant Kadcyla and perjeta q3weeks for 6 cycles starting 11/17/18. Stopped before surgery    11/29/2018 Pathology Results   Diagnosis 1. Breast, left,  needle core biopsy, inferior anterior (cylinder clip) - INVASIVE DUCTAL CARCINOMA. - DUCTAL CARCINOMA IN SITU. - LYMPHOVASCULAR INVASION IS IDENTIFIED. - SEE COMMENT. 2. Breast, left, needle core biopsy, central posterior (barbell clip) - INVASIVE DUCTAL CARCINOMA. - LYMPHOVASCULAR INVASION IS IDENTIFIED.   02/12/2019 Breast MRI   IMPRESSION: 1. Complete resolution of previously identified enhancing mass and associated non mass enhancement within the left breast. This is consistent with excellent response to chemotherapy. No residual or suspicious findings are identified. 2. No MRI evidence of malignancy on the right. 3. Previously identified left axillary lymphadenopathy not definitively seen on today's study. However, this may be due to decreased field-of-view compared to prior study.     03/14/2019 Cancer Staging   Staging form: Breast, AJCC 8th Edition - Pathologic stage from 03/14/2019: pT0, pN0, cM0, GX, ER: Unknown, PR: Unknown, HER2: Not Assessed - Signed by [name] MD on 03/28/2019   03/14/2019 Surgery   LEFT MASTECTOMY WITH TARGETED LYMPH NODE DISSECTION and Left  Axillary Sentinel Lymph  Node Biopsy by Dr [name] 03/14/19    03/14/2019 Pathology Results   FINAL MICROSCOPIC DIAGNOSIS:   A. LYMPH NODE, LEFT, SENTINEL, BIOPSY:  - There is no evidence of carcinoma in 1 of 1 lymph node (0/1).   B. BREAST, LEFT, MASTECTOMY:  - Benign breast parenchyma with treatment-related changes.  - There is no evidence of malignancy.  - See oncology table below.   C. LYMPH NODE, LEFT #1, SENTINEL, BIOPSY:  - There is no evidence of carcinoma in 1 of 1 lymph node (0/1).   D. LYMPH NODE, LEFT #2, SENTINEL, BIOPSY:  - There is no evidence of carcinoma in 1 of 1 lymph node (0/1).     04/04/2019 - 04/25/2019 Chemotherapy   Maintenance Herceptin injections every 3 weeks starting 04/03/19 to complete 1 year of treatment that was started in 11/2018. Stopped after 2nd dose as she will not be  repeating [place].    06/03/2021 - 06/24/2021 Chemotherapy   Patient is on Treatment Plan : BREAST Trastuzumab q21d X 11 Cycles     06/25/2021 - 01/01/2022 Chemotherapy   Patient is on Treatment Plan : BREAST Docetaxel + Trastuzumab + Pertuzumab (THP) q21d     06/25/2021 - 01/02/2022 Chemotherapy   Patient is on Treatment Plan : BREAST Docetaxel + Trastuzumab + Pertuzumab (THP) q21d     09/14/2021 Imaging   CT chest/abdomen/pelvis  IMPRESSION:  1. Interval resolution of previously seen bulky left axillary and  subpectoral lymphadenopathy. No persistently enlarged lymph nodes.  2. Multiple liver lesions are significantly diminished in size.  3. Widespread osseous metastatic disease, with an interval increase  in sclerosis of several previously lytic lesions.  4. Constellation of findings is consistent with treatment response  of metastatic disease  5. New, although age indeterminate pathologic wedge deformity of the  T6 vertebral body as well as an increased pathologic wedge deformity  of the L2 vertebral body.  6. Coronary artery disease.  Aortic Atherosclerosis (ICD10-I70.0).    01/26/2022 -  Chemotherapy   Patient is on Treatment Plan : BREAST METASTATIC Fam-Trastuzumab Deruxtecan-nxki (Enhertu) (5.4) q21d     Malignant neoplasm metastatic to liver ([place])  04/17/2021 Imaging   CT ABDOMEN AND PELVIS WITH CONTRAST: -New lytic lesion involving the L2 vertebral body (6:72, 2:26) with minimal cortical breakthrough superiorly, but with preservation of vertebral body height, concerning for metastatic disease.  -There are several indeterminate hepatic lesions as described above. In addition to the lesions described above, there is an additional subtle 1.5 cm hypodensity in the hepatic dome (2:7). Given clinical history and presence of a new L2 lesion, leading differential consideration is metastatic disease. Recommend further evaluation with MRI of the abdomen with and without contrast.   05/12/2021  Imaging   MRI ABDOMEN WITH AND WITHOUT CONTRAST: Numerous heterogeneously enhancing, internally necrotic lesions in the right hepatic lobe, highly concerning for metastatic disease. Largest lesion measures up to 6.7 cm in hepatic segment VI.   Enhancing osseous lesions in the L2 vertebral body, both iliac bones, and in the sacrum, highly concerning for osseous metastases.   05/15/2021 Initial Diagnosis   Liver metastases ([place])   05/27/2021 PET scan   1. Widespread recurrent/metastatic disease in this patient who is  status post bilateral mastectomy. Left chest wall recurrence with  left axillary/subpectoral, supraclavicular nodal metastasis.  2. Hepatic, left adrenal, and widespread osseous metastasis.  3. Incidental findings, including: Right nephrolithiasis. Tiny  hiatal hernia.    05/27/2021 Imaging   MRI  LUMBAR AND THORACIC SPINE: Thoracic spine:  Osseous metastatic disease involving each level. The most dramatic deposits are at T4 and T6 where there is extraosseous/epidural tumor. No cord compression but there is foraminal impingement on the left at T6-7 and on the right at T3-4. Early dorsal epidural tumor at the level of T10 and T3.   Lumbar spine:  1. Widespread osseous metastatic disease with largest deposit  replacing the L2 body where there is a compression fracture with  mild height loss. Also at this level is early epidural tumor  extension likely affecting both L2-3 foramina.  2. 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    05/27/2021 Imaging   MRI HEAD WITH AND WITHOUT CONTRAST: Negative for metastatic disease to the brain or calvarium.   06/03/2021 - 06/24/2021 Chemotherapy   Patient is on Treatment Plan : BREAST Trastuzumab q21d X 11 Cycles     06/25/2021 - 01/01/2022 Chemotherapy   Patient is on Treatment Plan : BREAST Docetaxel + Trastuzumab + Pertuzumab (THP) q21d     06/25/2021 - 01/02/2022 Chemotherapy   Patient is on Treatment Plan :  BREAST Docetaxel + Trastuzumab + Pertuzumab (THP) q21d     09/14/2021 Imaging   CT chest/abdomen/pelvis  IMPRESSION:  1. Interval resolution of previously seen bulky left axillary and  subpectoral lymphadenopathy. No persistently enlarged lymph nodes.  2. Multiple liver lesions are significantly diminished in size.  3. Widespread osseous metastatic disease, with an interval increase  in sclerosis of several previously lytic lesions.  4. Constellation of findings is consistent with treatment response  of metastatic disease  5. New, although age indeterminate pathologic wedge deformity of the  T6 vertebral body as well as an increased pathologic wedge deformity  of the L2 vertebral body.  6. Coronary artery disease.  Aortic Atherosclerosis (ICD10-I70.0).    01/26/2022 -  Chemotherapy   Patient is on Treatment Plan : BREAST METASTATIC Fam-Trastuzumab Deruxtecan-nxki (Enhertu) (5.4) q21d     Malignant neoplasm metastatic to bone ([place])  04/17/2021 Imaging   CT ABDOMEN AND PELVIS WITH CONTRAST: -New lytic lesion involving the L2 vertebral body (6:72, 2:26) with minimal cortical breakthrough superiorly, but with preservation of vertebral body height, concerning for metastatic disease.  -There are several indeterminate hepatic lesions as described above. In addition to the lesions described above, there is an additional subtle 1.5 cm hypodensity in the hepatic dome (2:7). Given clinical history and presence of a new L2 lesion, leading differential consideration is metastatic disease. Recommend further evaluation with MRI of the abdomen with and without contrast.   05/12/2021 Imaging   MRI ABDOMEN WITH AND WITHOUT CONTRAST: Numerous heterogeneously enhancing, internally necrotic lesions in the right hepatic lobe, highly concerning for metastatic disease. Largest lesion measures up to 6.7 cm in hepatic segment VI.   Enhancing osseous lesions in the L2 vertebral body, both iliac bones, and in the  sacrum, highly concerning for osseous metastases.   05/15/2021 Initial Diagnosis   Bone metastases ([place])   05/27/2021 PET scan   1. Widespread recurrent/metastatic disease in this patient who is  status post bilateral mastectomy. Left chest wall recurrence with  left axillary/subpectoral, supraclavicular nodal metastasis.  2. Hepatic, left adrenal, and widespread osseous metastasis.  3. Incidental findings, including: Right nephrolithiasis. Tiny  hiatal hernia.    05/27/2021 Imaging   MRI LUMBAR AND THORACIC SPINE: Thoracic spine:  Osseous metastatic disease involving each level. The most dramatic deposits are at T4 and T6 where there is extraosseous/epidural tumor.  No cord compression but there is foraminal impingement on the left at T6-7 and on the right at T3-4. Early dorsal epidural tumor at the level of T10 and T3.   Lumbar spine:  1. Widespread osseous metastatic disease with largest deposit  replacing the L2 body where there is a compression fracture with  mild height loss. Also at this level is early epidural tumor  extension likely affecting both L2-3 foramina.  2. 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    05/27/2021 Imaging   MRI HEAD WITH AND WITHOUT CONTRAST: Negative for metastatic disease to the brain or calvarium.   06/03/2021 - 06/24/2021 Chemotherapy   Patient is on Treatment Plan : BREAST Trastuzumab q21d X 11 Cycles     06/25/2021 - 01/01/2022 Chemotherapy   Patient is on Treatment Plan : BREAST Docetaxel + Trastuzumab + Pertuzumab (THP) q21d     06/25/2021 - 01/02/2022 Chemotherapy   Patient is on Treatment Plan : BREAST Docetaxel + Trastuzumab + Pertuzumab (THP) q21d     09/14/2021 Imaging   CT chest/abdomen/pelvis  IMPRESSION:  1. Interval resolution of previously seen bulky left axillary and  subpectoral lymphadenopathy. No persistently enlarged lymph nodes.  2. Multiple liver lesions are significantly diminished in size.  3.  Widespread osseous metastatic disease, with an interval increase  in sclerosis of several previously lytic lesions.  4. Constellation of findings is consistent with treatment response  of metastatic disease  5. New, although age indeterminate pathologic wedge deformity of the  T6 vertebral body as well as an increased pathologic wedge deformity  of the L2 vertebral body.  6. Coronary artery disease.  Aortic Atherosclerosis (ICD10-I70.0).    01/26/2022 -  Chemotherapy   Patient is on Treatment Plan : BREAST METASTATIC Fam-Trastuzumab Deruxtecan-nxki (Enhertu) (5.4) q21d         REVIEW OF SYSTEMS:   Review of Systems  Constitutional: Negative.   HENT:  Negative.    Eyes:  Positive for eye problems (tearing of the eye.).  Respiratory: Negative.    Cardiovascular: Negative.   Gastrointestinal:  Positive for constipation (occasionally).  Endocrine: Negative.   Genitourinary: Negative.    Musculoskeletal: Negative.   Skin: Negative.   Neurological:  Positive for numbness (severe paresthesias inability to use her left hand).  Hematological: Negative.   Psychiatric/Behavioral: Negative.        VITALS:  Blood pressure (!) 140/63, pulse 73, temperature 98 F (36.7 C), temperature source Oral, resp. rate 18, height 5' 0.25" (1.53 m), weight 106 lb 14.4 oz (48.5 kg), SpO2 97 %.  Wt Readings from Last 3 Encounters:  06/01/22 106 lb 8 oz (48.3 kg)  05/27/22 106 lb 14.4 oz (48.5 kg)  05/11/22 109 lb (49.4 kg)    Body mass index is 20.7 kg/m.  Performance status (ECOG): 1 - Symptomatic but completely ambulatory  PHYSICAL EXAM:  Physical Exam Exam conducted with a chaperone present.  Constitutional:      General: She is not in acute distress.    Appearance: Normal appearance. She is normal weight. She is not toxic-appearing.  HENT:     Head: Normocephalic and atraumatic.     Right Ear: Tympanic membrane normal. There is no impacted cerumen.     Left Ear: Tympanic membrane  normal. There is no impacted cerumen.     Nose: Nose normal.     Mouth/Throat:     Mouth: Mucous membranes are moist.     Pharynx: Oropharynx is clear.  Eyes:  General: No scleral icterus.    Extraocular Movements: Extraocular movements intact.     Conjunctiva/sclera: Conjunctivae normal.     Pupils: Pupils are equal, round, and reactive to light.  Cardiovascular:     Rate and Rhythm: Normal rate and regular rhythm.     Pulses: Normal pulses.     Heart sounds: Normal heart sounds.  Pulmonary:     Effort: Pulmonary effort is normal.     Breath sounds: Normal breath sounds.  Chest:  Breasts:    Right: Normal.     Left: Absent.     Comments: Left mastectomy is negative. She has a small area of erythema in the upper anterior of the left chest, no problems In the right chest Abdominal:     General: Bowel sounds are normal.     Palpations: Abdomen is soft.  Musculoskeletal:        General: Normal range of motion.     Cervical back: Normal range of motion and neck supple.     Comments: Mild lymphedema of the left arm  Skin:    General: Skin is warm and dry.     Findings: Erythema (mild) present.  Neurological:     Mental Status: She is alert and oriented to person, place, and time.     Motor: Weakness (She has severe paresthesias of the left upper extremity with inability to use her left hand) present.  Psychiatric:        Mood and Affect: Mood normal.        Behavior: Behavior normal.        Thought Content: Thought content normal.        Judgment: Judgment normal.         LABORATORY DATA:  I have reviewed the data as listed    Component Value Date/Time   NA 136 (A) 05/27/2022 0000   NA 137 11/13/2012 1057   K 4.0 05/27/2022 0000   K 4.5 11/13/2012 1057   CL 105 05/27/2022 0000   CO2 27 (A) 05/27/2022 0000   CO2 26 11/13/2012 1057   GLUCOSE 103 (H) 05/06/2022 1002   GLUCOSE 90 11/13/2012 1057   BUN 20 05/27/2022 0000   BUN 18.3 11/13/2012 1057   CREATININE  0.6 05/27/2022 0000   CREATININE 0.53 05/06/2022 1002   CREATININE 0.8 11/13/2012 1057   CALCIUM 9.2 05/27/2022 0000   CALCIUM 9.7 11/13/2012 1057   PROT 6.2 (L) 05/06/2022 1002   PROT 7.4 11/13/2012 1057   ALBUMIN 3.7 05/27/2022 0000   ALBUMIN 3.9 11/13/2012 1057   AST 38 (A) 05/27/2022 0000   AST 28 05/06/2022 1002   AST 22 11/13/2012 1057   ALT 18 05/27/2022 0000   ALT 14 05/06/2022 1002   ALT 15 11/13/2012 1057   ALKPHOS 53 05/27/2022 0000   ALKPHOS 77 11/13/2012 1057   BILITOT 0.6 05/06/2022 1002   BILITOT 0.54 11/13/2012 1057   GFRNONAA >60 05/06/2022 1002   GFRAA >60 04/25/2019 1045    No results found for: "SPEP", "UPEP"  Lab Results  Component Value Date   WBC 4.8 05/27/2022   NEUTROABS 2.64 05/27/2022   HGB 13.0 05/27/2022   HCT 38 05/27/2022   MCV 100 (A) 04/15/2022   PLT 163 05/27/2022      Chemistry      Component Value Date/Time   NA 136 (A) 05/27/2022 0000   NA 137 11/13/2012 1057   K 4.0 05/27/2022 0000   K 4.5 11/13/2012 1057   CL  105 05/27/2022 0000   CO2 27 (A) 05/27/2022 0000   CO2 26 11/13/2012 1057   BUN 20 05/27/2022 0000   BUN 18.3 11/13/2012 1057   CREATININE 0.6 05/27/2022 0000   CREATININE 0.53 05/06/2022 1002   CREATININE 0.8 11/13/2012 1057   GLU 96 05/27/2022 0000      Component Value Date/Time   CALCIUM 9.2 05/27/2022 0000   CALCIUM 9.7 11/13/2012 1057   ALKPHOS 53 05/27/2022 0000   ALKPHOS 77 11/13/2012 1057   AST 38 (A) 05/27/2022 0000   AST 28 05/06/2022 1002   AST 22 11/13/2012 1057   ALT 18 05/27/2022 0000   ALT 14 05/06/2022 1002   ALT 15 11/13/2012 1057   BILITOT 0.6 05/06/2022 1002   BILITOT 0.54 11/13/2012 1057       RADIOGRAPHIC STUDIES: No results found. EXAM:12/07/21 MRI OF THE LEFT HUMERUS WITHOUT AND WITH CONTRAST IMPRESSION: Changes of marrow reconversion without evidence of aggressive focal bone lesion in left humerus. Skin thickening and soft tissue swelling of the left upper extremity consistent  with lymphedema in the setting of prior left axillary lymph node dissection.     I,Gabriella Ballesteros,acting as a scribe for [name], MD.,have documented all relevant documentation on the behalf of [name], MD,as directed by  [name], MD while in the presence of [name], MD.

## 2022-05-28 ENCOUNTER — Other Ambulatory Visit: Payer: Self-pay

## 2022-05-28 ENCOUNTER — Encounter: Payer: Self-pay | Admitting: Oncology

## 2022-06-01 ENCOUNTER — Inpatient Hospital Stay: Payer: Medicare Other

## 2022-06-01 VITALS — BP 142/58 | HR 65 | Temp 97.5°F | Resp 16 | Ht 60.25 in | Wt 106.5 lb

## 2022-06-01 DIAGNOSIS — C787 Secondary malignant neoplasm of liver and intrahepatic bile duct: Secondary | ICD-10-CM

## 2022-06-01 DIAGNOSIS — C7951 Secondary malignant neoplasm of bone: Secondary | ICD-10-CM

## 2022-06-01 DIAGNOSIS — Z5112 Encounter for antineoplastic immunotherapy: Secondary | ICD-10-CM | POA: Diagnosis not present

## 2022-06-01 DIAGNOSIS — C50412 Malignant neoplasm of upper-outer quadrant of left female breast: Secondary | ICD-10-CM

## 2022-06-01 MED ORDER — DIPHENHYDRAMINE HCL 25 MG PO CAPS
50.0000 mg | ORAL_CAPSULE | Freq: Once | ORAL | Status: AC
  Administered 2022-06-01: 50 mg via ORAL
  Filled 2022-06-01: qty 2

## 2022-06-01 MED ORDER — FAM-TRASTUZUMAB DERUXTECAN-NXKI CHEMO 100 MG IV SOLR
4.0500 mg/kg | Freq: Once | INTRAVENOUS | Status: AC
  Administered 2022-06-01: 200 mg via INTRAVENOUS
  Filled 2022-06-01: qty 10

## 2022-06-01 MED ORDER — SODIUM CHLORIDE 0.9 % IV SOLN
10.0000 mg | Freq: Once | INTRAVENOUS | Status: AC
  Administered 2022-06-01: 10 mg via INTRAVENOUS
  Filled 2022-06-01: qty 10

## 2022-06-01 MED ORDER — DEXTROSE 5 % IV SOLN: Freq: Once | INTRAVENOUS | Status: AC

## 2022-06-01 MED ORDER — PALONOSETRON HCL INJECTION 0.25 MG/5ML
0.2500 mg | Freq: Once | INTRAVENOUS | Status: AC
  Administered 2022-06-01: 0.25 mg via INTRAVENOUS
  Filled 2022-06-01: qty 5

## 2022-06-01 MED ORDER — SODIUM CHLORIDE 0.9% FLUSH
10.0000 mL | INTRAVENOUS | Status: DC | PRN
  Administered 2022-06-01: 10 mL

## 2022-06-01 MED ORDER — HEPARIN SOD (PORK) LOCK FLUSH 100 UNIT/ML IV SOLN
500.0000 [IU] | Freq: Once | INTRAVENOUS | Status: AC | PRN
  Administered 2022-06-01: 500 [IU]

## 2022-06-01 MED ORDER — ACETAMINOPHEN 325 MG PO TABS
650.0000 mg | ORAL_TABLET | Freq: Once | ORAL | Status: AC
  Administered 2022-06-01: 650 mg via ORAL
  Filled 2022-06-01: qty 2

## 2022-06-01 NOTE — Patient Instructions (Signed)
[facility]  Discharge Instructions: Thank you for choosing [facility] to provide your oncology and hematology care.  If you have a lab appointment with the Cancer Center, please go directly to the Cancer Center and check in at the registration area.   Wear comfortable clothing and clothing appropriate for easy access to any Portacath or PICC line.   We strive to give you quality time with your provider. You may need to reschedule your appointment if you arrive late (15 or more minutes).  Arriving late affects you and other patients whose appointments are after yours.  Also, if you miss three or more appointments without notifying the office, you may be dismissed from the clinic at the provider's discretion.      For prescription refill requests, have your pharmacy contact our office and allow 72 hours for refills to be completed.    Today you received the following chemotherapy and/or immunotherapy agents Enhertu      To help prevent nausea and vomiting after your treatment, we encourage you to take your nausea medication as directed.  BELOW ARE SYMPTOMS THAT SHOULD BE REPORTED IMMEDIATELY: *FEVER GREATER THAN 100.4 F (38 C) OR HIGHER *CHILLS OR SWEATING *NAUSEA AND VOMITING THAT IS NOT CONTROLLED WITH YOUR NAUSEA MEDICATION *UNUSUAL SHORTNESS OF BREATH *UNUSUAL BRUISING OR BLEEDING *URINARY PROBLEMS (pain or burning when urinating, or frequent urination) *BOWEL PROBLEMS (unusual diarrhea, constipation, pain near the anus) TENDERNESS IN MOUTH AND THROAT WITH OR WITHOUT PRESENCE OF ULCERS (sore throat, sores in mouth, or a toothache) UNUSUAL RASH, SWELLING OR PAIN  UNUSUAL VAGINAL DISCHARGE OR ITCHING   Items with * indicate a potential emergency and should be followed up as soon as possible or go to the Emergency Department if any problems should occur.  Please show the CHEMOTHERAPY ALERT CARD or IMMUNOTHERAPY ALERT CARD at check-in to the  Emergency Department and triage nurse.  Should you have questions after your visit or need to cancel or reschedule your appointment, please contact [facility]  Dept: [phone]  and follow the prompts.  Office hours are 8:00 a.m. to 4:30 p.m. Monday - Friday. Please note that voicemails left after 4:00 p.m. may not be returned until the following business day.  We are closed weekends and major holidays. You have access to a nurse at all times for urgent questions. Please call the main number to the clinic Dept: [phone] and follow the prompts.  For any non-urgent questions, you may also contact your provider using MyChart. We now offer e-Visits for anyone 18 and older to request care online for non-urgent symptoms. For details visit [email or web address].   Also download the MyChart app! Go to the app store, search "MyChart", open the app, select [facility], and log in with your MyChart username and password.  

## 2022-06-15 ENCOUNTER — Encounter: Payer: Self-pay | Admitting: Oncology

## 2022-06-15 NOTE — Progress Notes (Signed)
Patient Care Team: [name], MD as PCP - General (Family Medicine) [name], MD as Consulting Physician (Oncology)  Clinic Day: 06/17/22   Referring physician: [name], MD  ASSESSMENT & PLAN:   Assessment & Plan: Malignant neoplasm of upper-outer quadrant of left breast in female, estrogen receptor negative ([place]) Stage IIB (T2c N1 M0) HER2 receptor positive left breast cancer, diagnosed in June 2020. This was ER/PR negative. This was treated with neoadjuvant Kadcyla/Perjeta and left mastectomy, with a complete response in breast and nodes. She had only received two doses of adjuvant Herceptin before being discontinued in December 2020.   History of colon cancer, stage II History of stage II colon cancer, diagnosed in May 2007. This was treated with surgical resection and adjuvant chemotherapy with Xeloda. Colonoscopy from 2013 revealed 2 polyps which were resected and chronic diverticulosis.   Malignant neoplasm metastatic to liver [facility]) Liver metastases discovered 04/17/21.  Numerous heterogeneously enhancing, internally necrotic lesions in the right hepatic lobe, consistent with metastatic disease. Largest lesion measures up to 6.7 cm and there are at least 6 lesions. We now have biopsy proven HER2 positive recurrent breast cancer with ER/PR negative. She received THP and tolerated it without significant difficulty.  CT scan showed excellent response with significant decrease in the size of her liver lesions. She later had progression of this disease and was switched to Enhertu.    Malignant neoplasm metastatic to bone [facility]) Bone metastases discovered 04/17/21. Enhancing osseous lesions are present in the L2 vertebral body, both iliac bones, and in the sacrum, highly concerning for osseous metastases. She received her 1st dose of zoledronic acid on January 16th.  I once again today explained to her and her son the reasoning for this medicine.  Her widespread bone  metastases show an interval increase in sclerosis of previously lytic lesions consistent with treatment response.   Chest wall recurrence of breast cancer, left ([name]) Left supraclavicular lymph node measuring 2-3 cm. Also involvement of subpectoral node and chest wall skin. This was rapidly progressive despite receiving  trastuzumab.  We have added in Perjeta and Taxotere to her current regimen and this has been very effective.  The CT scan also shows good response of her adenopathy with resolution of the previous bulky left axillary and subpectoral adenopathy. In September of 2023, she had a 2 cm nodule  located close to the left shoulder associated with nodularity of the left upper chest wall and appeared to have obvious progression of disease again. She was changed to [facility] and is responding well.  Lymphedema She does have existing lymphedema to the left arm; however, this is better today, with significant edema of the hand as well. We will refer her to a lymphedema specialist and she requests one in [place], so we will arrange that. She sleeps with the hand and arm elevated.  She has decreased use of her left hand but I think this is due to more than just the lymphedema.She will be seeing the neurologist to complete the nerve conduction velocity test on her hand in March.   Plan: She will be seeing the neurologist to complete the nerve conduction velocity test on her hand in March. She no longer has any visible or palpable disease. She will continue with treatment next week, I will see her back in 3 weeks with CBC and CMP for her 9th cycle of Enhertu. The patient and her son understand the plans discussed today and are in agreement with them.  She knows to contact our office if she develops concerns prior to her next appointment.     [name], MD  [facility] [address] [place] [address] Dept:  [phone] Dept Fax: [phone]   [name].     CHIEF COMPLAINT:  CC: An 89 year old female with metastatic breast cancer   Current Treatment:  Planning Enhertu  INTERVAL HISTORY:  Ilana is here today for repeat clinical assessment prior to her 8th cycle of Enhertu next Tuesday for metastatic breast cancer. She continues to be unable to use her left hand, and is distressed by this. She notes that she has been swelling in her hand. She also complains of constant tearing of her eyes. She will be seeing the neurologist to complete the nerve conduction velocity test on her hand in March. CBC is normal, CMP is pending. Overall she is responding well to the Enhertu, and tolerating it fairly well. She denies fevers or chills. She denies pain. Her appetite is good. Her weight has gained 2 pounds since her last visit.   I have reviewed the past medical history, past surgical history, social history and family history with the patient and they are unchanged from previous note.  ALLERGIES:  has No Known Allergies.  MEDICATIONS:  Current Outpatient Medications  Medication Sig Dispense Refill   Biotin 1 MG CAPS Take by mouth.     fish oil-omega-3 fatty acids 1000 MG capsule Take 1 g by mouth daily.     gabapentin (NEURONTIN) 100 MG capsule Take 1 capsule (100 mg total) by mouth at bedtime. (Patient not taking: Reported on 03/05/2022) 30 capsule 5   magnesium citrate SOLN Take 0.5 Bottles by mouth as needed for severe constipation.     Multiple Vitamin (MULTIVITAMIN) capsule Take 1 capsule by mouth daily.     ondansetron (ZOFRAN) 4 MG tablet Take 1 tablet (4 mg total) by mouth every 8 (eight) hours as needed for nausea or vomiting. (Patient not taking: Reported on 03/05/2022) 20 tablet 2   polyethylene glycol (MIRALAX / GLYCOLAX) 17 g packet Take 17 g by mouth as needed. constipation     No current facility-administered medications for this  visit.    HISTORY OF PRESENT ILLNESS:   Oncology History Overview Note  Cancer Staging Malignant neoplasm of upper-outer quadrant of left breast in female, estrogen receptor negative ([place]) Staging form: Breast, AJCC 8th Edition - Clinical stage from 10/11/2018: Stage IIB (cT2, cN1, cM0, G3, ER-, PR-, HER2+) - Signed by Truitt Merle, MD on 11/02/2018 - Clinical: No stage assigned - Unsigned    History of colon cancer, stage II  09/2005 Initial Diagnosis   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 Findings were a 5.9 x 3.8 x 1.2 cm moderate to well-differentiated adenocarcinoma penetrating the muscular wall. Forty lymph nodes negative. No vascular or lymphatic invasion. Multiple diverticula with microabscess formation and inflammation. Carcinoma present in at least 1 diverticulum. Preop CEA 5.2 with lab normal range 0 to 2.5. Preop CT scan with no obvious additional pathology.    2007 -  Chemotherapy   She received oral Xeloda chemotherapy as an adjuvant. She declined treatment on a clinical trial.    11/12/2011 Initial Diagnosis   [name], stage II   12/09/2011 Procedure   Followup  colonoscopy done on 12/09/2011. She was found to have 2 polyps which were removed. Chronic diverticulosis.     Malignant neoplasm of upper-outer quadrant of left breast in female, estrogen receptor negative ([place])  09/26/2018 Mammogram   Mammogram/US of left breast 09/26/18 IMPRESSION:  1. 2.9cm irregular mass in the upper outer left breast corresponds to the palpable abnormality. This is highly suspicious for breast carcinoma.  2. Two adjacent abnormal left axillary  Lymph nodes suspicious for metastatic adenopathy. There is a third borderline abnormal left axillary LN with a cortex thickened to 75m.  3. Benign right breast cyst. No evidence of right breast malignancy.    10/11/2018 Cancer Staging   Staging form:  Breast, AJCC 8th Edition - Clinical stage from 10/11/2018: Stage IIB (cT2, cN1, cM0, G3, ER-, PR-, HER2+) - Signed by [name] MD on 11/02/2018   10/11/2018 Initial Biopsy   Diagnosis 10/11/18 1. Breast, left, needle core biopsy, upper outer left 2 o'clock - INVASIVE DUCTAL CARCINOMA. - DUCTAL CARCINOMA IN SITU. - LYMPHOVASCULAR INVASION IS IDENTIFIED. - SEE COMMENT. 2. Lymph node, needle/core biopsy, left axilla - INVASIVE DUCTAL CARCINOMA. - SEE COMMENT.   10/11/2018 Receptors her2   The tumor cells are POSITIVE for Her2 (3+). Estrogen Receptor: 0%, NEGATIVE Progesterone Receptor: 0%, NEGATIVE Proliferation Marker Ki67: 70%   11/02/2018 Initial Diagnosis   Malignant neoplasm of upper-outer quadrant of left breast in [place], [place]   11/15/2018 Breast MRI   MRI breast 11/15/18  IMPRESSION: 1. 3.3 centimeter mass in the LATERAL portion of the LEFT breast consistent with known malignancy. 2. There is significant non mass enhancement surrounding this mass and extending anteriorly into the nipple base, with largest diameter in the anterior to posterior axis, measuring 7.2 centimeters. 3. If the patient would consider breast conservation, additional MR guided core biopsies are recommended. Consider biopsy of the inferior and anterior extent of the non mass enhancement to document extent of disease. 4. Three enlarged LEFT axillary lymph nodes. 5. RIGHT breast is negative.   11/15/2018 PET scan   PET 11/15/18 IMPRESSION: Hypermetabolic left breast lesion compatible with known primary. Hypermetabolic left axillary lymph nodes are consistent with metastatic disease.   No evidence for additional hypermetabolic metastatic involvement in the neck, chest, abdomen, or pelvis.   11/17/2018 - 03/01/2019 Chemotherapy   Neo-adjuvant Kadcyla and perjeta q3weeks for 6 cycles starting 11/17/18. Stopped before surgery    11/29/2018 Pathology Results   Diagnosis 1. Breast, left,  needle core biopsy, inferior anterior (cylinder clip) - INVASIVE DUCTAL CARCINOMA. - DUCTAL CARCINOMA IN SITU. - LYMPHOVASCULAR INVASION IS IDENTIFIED. - SEE COMMENT. 2. Breast, left, needle core biopsy, central posterior (barbell clip) - INVASIVE DUCTAL CARCINOMA. - LYMPHOVASCULAR INVASION IS IDENTIFIED.   02/12/2019 Breast MRI   IMPRESSION: 1. Complete resolution of previously identified enhancing mass and associated non mass enhancement within the left breast. This is consistent with excellent response to chemotherapy. No residual or suspicious findings are identified. 2. No MRI evidence of malignancy on the right. 3. Previously identified left axillary lymphadenopathy not definitively seen on today's study. However, this may be due to decreased field-of-view compared to prior study.     03/14/2019 Cancer Staging   Staging form: Breast, AJCC 8th Edition - Pathologic stage from 03/14/2019: pT0, pN0, cM0, GX, ER: Unknown, PR: Unknown, HER2: Not Assessed - Signed by [name] MD on 03/28/2019   03/14/2019 Surgery   LEFT MASTECTOMY WITH TARGETED LYMPH NODE DISSECTION and Left Axillary Sentinel Lymph  Node  Biopsy by Dr [name] 03/14/19    03/14/2019 Pathology Results   FINAL MICROSCOPIC DIAGNOSIS:   A. LYMPH NODE, LEFT, SENTINEL, BIOPSY:  - There is no evidence of carcinoma in 1 of 1 lymph node (0/1).   B. BREAST, LEFT, MASTECTOMY:  - Benign breast parenchyma with treatment-related changes.  - There is no evidence of malignancy.  - See oncology table below.   C. LYMPH NODE, LEFT #1, SENTINEL, BIOPSY:  - There is no evidence of carcinoma in 1 of 1 lymph node (0/1).   D. LYMPH NODE, LEFT #2, SENTINEL, BIOPSY:  - There is no evidence of carcinoma in 1 of 1 lymph node (0/1).     04/04/2019 - 04/25/2019 Chemotherapy   Maintenance Herceptin injections every 3 weeks starting 04/03/19 to complete 1 year of treatment that was started in 11/2018. Stopped after 2nd dose as she will not be  repeating Echos.    06/03/2021 - 06/24/2021 Chemotherapy   Patient is on Treatment Plan : BREAST Trastuzumab q21d X 11 Cycles     06/25/2021 - 01/01/2022 Chemotherapy   Patient is on Treatment Plan : BREAST Docetaxel + Trastuzumab + Pertuzumab (THP) q21d     06/25/2021 - 01/02/2022 Chemotherapy   Patient is on Treatment Plan : BREAST Docetaxel + Trastuzumab + Pertuzumab (THP) q21d     09/14/2021 Imaging   CT chest/abdomen/pelvis  IMPRESSION:  1. Interval resolution of previously seen bulky left axillary and  subpectoral lymphadenopathy. No persistently enlarged lymph nodes.  2. Multiple liver lesions are significantly diminished in size.  3. Widespread osseous metastatic disease, with an interval increase  in sclerosis of several previously lytic lesions.  4. Constellation of findings is consistent with treatment response  of metastatic disease  5. New, although age indeterminate pathologic wedge deformity of the  T6 vertebral body as well as an increased pathologic wedge deformity  of the L2 vertebral body.  6. Coronary artery disease.  Aortic Atherosclerosis (ICD10-I70.0).    01/26/2022 -  Chemotherapy   Patient is on Treatment Plan : BREAST METASTATIC Fam-Trastuzumab Deruxtecan-nxki (Enhertu) (5.4) q21d     Malignant neoplasm metastatic to liver ([place])  04/17/2021 Imaging   CT ABDOMEN AND PELVIS WITH CONTRAST: -New lytic lesion involving the L2 vertebral body (6:72, 2:26) with minimal cortical breakthrough superiorly, but with preservation of vertebral body height, concerning for metastatic disease.  -There are several indeterminate hepatic lesions as described above. In addition to the lesions described above, there is an additional subtle 1.5 cm hypodensity in the hepatic dome (2:7). Given clinical history and presence of a new L2 lesion, leading differential consideration is metastatic disease. Recommend further evaluation with MRI of the abdomen with and without contrast.   05/12/2021  Imaging   MRI ABDOMEN WITH AND WITHOUT CONTRAST: Numerous heterogeneously enhancing, internally necrotic lesions in the right hepatic lobe, highly concerning for metastatic disease. Largest lesion measures up to 6.7 cm in hepatic segment VI.   Enhancing osseous lesions in the L2 vertebral body, both iliac bones, and in the sacrum, highly concerning for osseous metastases.   05/15/2021 Initial Diagnosis   Liver metastases ([place])   05/27/2021 PET scan   1. Widespread recurrent/metastatic disease in this patient who is  status post bilateral mastectomy. Left chest wall recurrence with  left axillary/subpectoral, supraclavicular nodal metastasis.  2. Hepatic, left adrenal, and widespread osseous metastasis.  3. Incidental findings, including: Right nephrolithiasis. Tiny  hiatal hernia.    05/27/2021 Imaging   MRI LUMBAR AND THORACIC SPINE: Thoracic  spine:  Osseous metastatic disease involving each level. The most dramatic deposits are at T4 and T6 where there is extraosseous/epidural tumor. No cord compression but there is foraminal impingement on the left at T6-7 and on the right at T3-4. Early dorsal epidural tumor at the level of T10 and T3.   Lumbar spine:  1. Widespread osseous metastatic disease with largest deposit  replacing the L2 body where there is a compression fracture with  mild height loss. Also at this level is early epidural tumor  extension likely affecting both L2-3 foramina.  2. 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    05/27/2021 Imaging   MRI HEAD WITH AND WITHOUT CONTRAST: Negative for metastatic disease to the brain or calvarium.   06/03/2021 - 06/24/2021 Chemotherapy   Patient is on Treatment Plan : BREAST Trastuzumab q21d X 11 Cycles     06/25/2021 - 01/01/2022 Chemotherapy   Patient is on Treatment Plan : BREAST Docetaxel + Trastuzumab + Pertuzumab (THP) q21d     06/25/2021 - 01/02/2022 Chemotherapy   Patient is on Treatment Plan :  BREAST Docetaxel + Trastuzumab + Pertuzumab (THP) q21d     09/14/2021 Imaging   CT chest/abdomen/pelvis  IMPRESSION:  1. Interval resolution of previously seen bulky left axillary and  subpectoral lymphadenopathy. No persistently enlarged lymph nodes.  2. Multiple liver lesions are significantly diminished in size.  3. Widespread osseous metastatic disease, with an interval increase  in sclerosis of several previously lytic lesions.  4. Constellation of findings is consistent with treatment response  of metastatic disease  5. New, although age indeterminate pathologic wedge deformity of the  T6 vertebral body as well as an increased pathologic wedge deformity  of the L2 vertebral body.  6. Coronary artery disease.  Aortic Atherosclerosis (ICD10-I70.0).    01/26/2022 -  Chemotherapy   Patient is on Treatment Plan : BREAST METASTATIC Fam-Trastuzumab Deruxtecan-nxki (Enhertu) (5.4) q21d     Malignant neoplasm metastatic to bone ([place])  04/17/2021 Imaging   CT ABDOMEN AND PELVIS WITH CONTRAST: -New lytic lesion involving the L2 vertebral body (6:72, 2:26) with minimal cortical breakthrough superiorly, but with preservation of vertebral body height, concerning for metastatic disease.  -There are several indeterminate hepatic lesions as described above. In addition to the lesions described above, there is an additional subtle 1.5 cm hypodensity in the hepatic dome (2:7). Given clinical history and presence of a new L2 lesion, leading differential consideration is metastatic disease. Recommend further evaluation with MRI of the abdomen with and without contrast.   05/12/2021 Imaging   MRI ABDOMEN WITH AND WITHOUT CONTRAST: Numerous heterogeneously enhancing, internally necrotic lesions in the right hepatic lobe, highly concerning for metastatic disease. Largest lesion measures up to 6.7 cm in hepatic segment VI.   Enhancing osseous lesions in the L2 vertebral body, both iliac bones, and in the  sacrum, highly concerning for osseous metastases.   05/15/2021 Initial Diagnosis   Bone metastases ([place])   05/27/2021 PET scan   1. Widespread recurrent/metastatic disease in this patient who is  status post bilateral mastectomy. Left chest wall recurrence with  left axillary/subpectoral, supraclavicular nodal metastasis.  2. Hepatic, left adrenal, and widespread osseous metastasis.  3. Incidental findings, including: Right nephrolithiasis. Tiny  hiatal hernia.    05/27/2021 Imaging   MRI LUMBAR AND THORACIC SPINE: Thoracic spine:  Osseous metastatic disease involving each level. The most dramatic deposits are at T4 and T6 where there is extraosseous/epidural tumor. No cord compression but there  is foraminal impingement on the left at T6-7 and on the right at T3-4. Early dorsal epidural tumor at the level of T10 and T3.   Lumbar spine:  1. Widespread osseous metastatic disease with largest deposit  replacing the L2 body where there is a compression fracture with  mild height loss. Also at this level is early epidural tumor  extension likely affecting both L2-3 foramina.  2. 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    05/27/2021 Imaging   MRI HEAD WITH AND WITHOUT CONTRAST: Negative for metastatic disease to the brain or calvarium.   06/03/2021 - 06/24/2021 Chemotherapy   Patient is on Treatment Plan : BREAST Trastuzumab q21d X 11 Cycles     06/25/2021 - 01/01/2022 Chemotherapy   Patient is on Treatment Plan : BREAST Docetaxel + Trastuzumab + Pertuzumab (THP) q21d     06/25/2021 - 01/02/2022 Chemotherapy   Patient is on Treatment Plan : BREAST Docetaxel + Trastuzumab + Pertuzumab (THP) q21d     09/14/2021 Imaging   CT chest/abdomen/pelvis  IMPRESSION:  1. Interval resolution of previously seen bulky left axillary and  subpectoral lymphadenopathy. No persistently enlarged lymph nodes.  2. Multiple liver lesions are significantly diminished in size.  3.  Widespread osseous metastatic disease, with an interval increase  in sclerosis of several previously lytic lesions.  4. Constellation of findings is consistent with treatment response  of metastatic disease  5. New, although age indeterminate pathologic wedge deformity of the  T6 vertebral body as well as an increased pathologic wedge deformity  of the L2 vertebral body.  6. Coronary artery disease.  Aortic Atherosclerosis (ICD10-I70.0).    01/26/2022 -  Chemotherapy   Patient is on Treatment Plan : BREAST METASTATIC Fam-Trastuzumab Deruxtecan-nxki (Enhertu) (5.4) q21d         REVIEW OF SYSTEMS:   Review of Systems  Constitutional: Negative.   HENT:  Negative.    Eyes:  Positive for eye problems (tearing of the eye.).  Respiratory: Negative.    Cardiovascular: Negative.   Gastrointestinal:  Positive for constipation (occasionally).  Endocrine: Negative.   Genitourinary: Negative.    Musculoskeletal: Negative.   Skin: Negative.   Neurological:  Positive for numbness (severe paresthesias inability to use her left hand).  Hematological: Negative.   Psychiatric/Behavioral: Negative.        VITALS:  Blood pressure (!) 166/63, pulse 74, temperature 97.8 F (36.6 C), temperature source Oral, resp. rate 18, height 5' 0.25" (1.53 m), weight 108 lb 6.4 oz (49.2 kg), SpO2 98 %.  Wt Readings from Last 3 Encounters:  06/22/22 102 lb 0.6 oz (46.3 kg)  06/17/22 108 lb 6.4 oz (49.2 kg)  06/01/22 106 lb 8 oz (48.3 kg)    Body mass index is 21 kg/m.  Performance status (ECOG): 1 - Symptomatic but completely ambulatory  PHYSICAL EXAM:  Physical Exam Exam conducted with a chaperone present.  Constitutional:      General: She is not in acute distress.    Appearance: Normal appearance. She is normal weight. She is not toxic-appearing.  HENT:     Head: Normocephalic and atraumatic.     Right Ear: Tympanic membrane normal. There is no impacted cerumen.     Left Ear: Tympanic membrane  normal. There is no impacted cerumen.     Nose: Nose normal.     Mouth/Throat:     Mouth: Mucous membranes are moist.     Pharynx: Oropharynx is clear.  Eyes:  General: No scleral icterus.    Extraocular Movements: Extraocular movements intact.     Conjunctiva/sclera: Conjunctivae normal.     Pupils: Pupils are equal, round, and reactive to light.  Cardiovascular:     Rate and Rhythm: Normal rate and regular rhythm.     Pulses: Normal pulses.     Heart sounds: Normal heart sounds.  Pulmonary:     Effort: Pulmonary effort is normal.     Breath sounds: Normal breath sounds.  Chest:  Breasts:    Right: Normal.     Left: Absent.     Comments: Left mastectomy is negative. She has a small area of erythema in the upper anterior of the left chest, no problems In the right chest  Abdominal:     General: Bowel sounds are normal.     Palpations: Abdomen is soft.  Musculoskeletal:        General: Normal range of motion.     Cervical back: Normal range of motion and neck supple.     Comments: Mild lymphedema of the left arm  Skin:    General: Skin is warm and dry.     Findings: Erythema (mild) present.  Neurological:     Mental Status: She is alert and oriented to person, place, and time.     Motor: Weakness (She has severe paresthesias of the left upper extremity with inability to use her left hand) present.  Psychiatric:        Mood and Affect: Mood normal.        Behavior: Behavior normal.        Thought Content: Thought content normal.        Judgment: Judgment normal.         LABORATORY DATA:  I have reviewed the data as listed    Component Value Date/Time   NA 138 06/17/2022 0000   NA 137 11/13/2012 1057   K 4.1 06/17/2022 0000   K 4.5 11/13/2012 1057   CL 107 06/17/2022 0000   CO2 25 (A) 06/17/2022 0000   CO2 26 11/13/2012 1057   GLUCOSE 103 (H) 05/06/2022 1002   GLUCOSE 90 11/13/2012 1057   BUN 22 (A) 06/17/2022 0000   BUN 18.3 11/13/2012 1057   CREATININE  0.6 06/17/2022 0000   CREATININE 0.53 05/06/2022 1002   CREATININE 0.8 11/13/2012 1057   CALCIUM 9.4 06/17/2022 0000   CALCIUM 9.7 11/13/2012 1057   PROT 6.2 (L) 05/06/2022 1002   PROT 7.4 11/13/2012 1057   ALBUMIN 4.0 06/17/2022 0000   ALBUMIN 3.9 11/13/2012 1057   AST 38 (A) 06/17/2022 0000   AST 28 05/06/2022 1002   AST 22 11/13/2012 1057   ALT 17 06/17/2022 0000   ALT 14 05/06/2022 1002   ALT 15 11/13/2012 1057   ALKPHOS 53 06/17/2022 0000   ALKPHOS 77 11/13/2012 1057   BILITOT 0.6 05/06/2022 1002   BILITOT 0.54 11/13/2012 1057   GFRNONAA >60 05/06/2022 1002   GFRAA >60 04/25/2019 1045    No results found for: "SPEP", "UPEP"  Lab Results  Component Value Date   WBC 5.8 06/17/2022   NEUTROABS 3.25 06/17/2022   HGB 13.1 06/17/2022   HCT 38 06/17/2022   MCV 100 (A) 04/15/2022   PLT 172 06/17/2022      Chemistry      Component Value Date/Time   NA 138 06/17/2022 0000   NA 137 11/13/2012 1057   K 4.1 06/17/2022 0000   K 4.5 11/13/2012 1057   CL  107 06/17/2022 0000   CO2 25 (A) 06/17/2022 0000   CO2 26 11/13/2012 1057   BUN 22 (A) 06/17/2022 0000   BUN 18.3 11/13/2012 1057   CREATININE 0.6 06/17/2022 0000   CREATININE 0.53 05/06/2022 1002   CREATININE 0.8 11/13/2012 1057   GLU 99 06/17/2022 0000      Component Value Date/Time   CALCIUM 9.4 06/17/2022 0000   CALCIUM 9.7 11/13/2012 1057   ALKPHOS 53 06/17/2022 0000   ALKPHOS 77 11/13/2012 1057   AST 38 (A) 06/17/2022 0000   AST 28 05/06/2022 1002   AST 22 11/13/2012 1057   ALT 17 06/17/2022 0000   ALT 14 05/06/2022 1002   ALT 15 11/13/2012 1057   BILITOT 0.6 05/06/2022 1002   BILITOT 0.54 11/13/2012 1057       RADIOGRAPHIC STUDIES: No results found. EXAM:12/07/21 MRI OF THE LEFT HUMERUS WITHOUT AND WITH CONTRAST IMPRESSION: Changes of marrow reconversion without evidence of aggressive focal bone lesion in left humerus. Skin thickening and soft tissue swelling of the left upper extremity consistent  with lymphedema in the setting of prior left axillary lymph node dissection.     I,Gabriella Ballesteros,acting as a scribe for [name], MD.,have documented all relevant documentation on the behalf of [name], MD,as directed by  [name], MD while in the presence of [name], MD.

## 2022-06-17 ENCOUNTER — Inpatient Hospital Stay: Payer: Medicare Other | Attending: Oncology

## 2022-06-17 ENCOUNTER — Encounter: Payer: Self-pay | Admitting: Oncology

## 2022-06-17 ENCOUNTER — Other Ambulatory Visit: Payer: Self-pay | Admitting: Oncology

## 2022-06-17 ENCOUNTER — Inpatient Hospital Stay (INDEPENDENT_AMBULATORY_CARE_PROVIDER_SITE_OTHER): Payer: Medicare Other | Admitting: Oncology

## 2022-06-17 VITALS — BP 166/63 | HR 74 | Temp 97.8°F | Resp 18 | Ht 60.25 in | Wt 108.4 lb

## 2022-06-17 DIAGNOSIS — C787 Secondary malignant neoplasm of liver and intrahepatic bile duct: Secondary | ICD-10-CM | POA: Diagnosis not present

## 2022-06-17 DIAGNOSIS — Z9012 Acquired absence of left breast and nipple: Secondary | ICD-10-CM | POA: Insufficient documentation

## 2022-06-17 DIAGNOSIS — I890 Lymphedema, not elsewhere classified: Secondary | ICD-10-CM | POA: Insufficient documentation

## 2022-06-17 DIAGNOSIS — C50412 Malignant neoplasm of upper-outer quadrant of left female breast: Secondary | ICD-10-CM

## 2022-06-17 DIAGNOSIS — K5900 Constipation, unspecified: Secondary | ICD-10-CM | POA: Insufficient documentation

## 2022-06-17 DIAGNOSIS — C50912 Malignant neoplasm of unspecified site of left female breast: Secondary | ICD-10-CM

## 2022-06-17 DIAGNOSIS — Z171 Estrogen receptor negative status [ER-]: Secondary | ICD-10-CM

## 2022-06-17 DIAGNOSIS — R200 Anesthesia of skin: Secondary | ICD-10-CM | POA: Insufficient documentation

## 2022-06-17 DIAGNOSIS — Z5112 Encounter for antineoplastic immunotherapy: Secondary | ICD-10-CM | POA: Insufficient documentation

## 2022-06-17 DIAGNOSIS — C7951 Secondary malignant neoplasm of bone: Secondary | ICD-10-CM

## 2022-06-17 DIAGNOSIS — C171 Malignant neoplasm of jejunum: Secondary | ICD-10-CM | POA: Insufficient documentation

## 2022-06-17 DIAGNOSIS — C7989 Secondary malignant neoplasm of other specified sites: Secondary | ICD-10-CM

## 2022-06-17 DIAGNOSIS — C778 Secondary and unspecified malignant neoplasm of lymph nodes of multiple regions: Secondary | ICD-10-CM | POA: Insufficient documentation

## 2022-06-17 DIAGNOSIS — R600 Localized edema: Secondary | ICD-10-CM | POA: Insufficient documentation

## 2022-06-17 DIAGNOSIS — Z85038 Personal history of other malignant neoplasm of large intestine: Secondary | ICD-10-CM | POA: Insufficient documentation

## 2022-06-17 LAB — CBC: RBC: 3.72 — AB (ref 3.87–5.11)

## 2022-06-17 LAB — HEPATIC FUNCTION PANEL
ALT: 17 U/L (ref 7–35)
AST: 38 — AB (ref 13–35)
Alkaline Phosphatase: 53 (ref 25–125)
Bilirubin, Total: 0.6

## 2022-06-17 LAB — CBC AND DIFFERENTIAL
HCT: 38 (ref 36–46)
Hemoglobin: 13.1 (ref 12.0–16.0)
Neutrophils Absolute: 3.25
Platelets: 172 10*3/uL (ref 150–400)
WBC: 5.8

## 2022-06-17 LAB — BASIC METABOLIC PANEL
BUN: 22 — AB (ref 4–21)
CO2: 25 — AB (ref 13–22)
Chloride: 107 (ref 99–108)
Creatinine: 0.6 (ref 0.5–1.1)
Glucose: 99
Potassium: 4.1 mEq/L (ref 3.5–5.1)
Sodium: 138 (ref 137–147)

## 2022-06-17 LAB — COMPREHENSIVE METABOLIC PANEL
Albumin: 4 (ref 3.5–5.0)
Calcium: 9.4 (ref 8.7–10.7)

## 2022-06-21 MED FILL — Fam-Trastuzumab Deruxtecan-nxki For IV Soln 100 MG: INTRAVENOUS | Qty: 10 | Status: AC

## 2022-06-21 MED FILL — Zoledronic Acid Inj Conc For IV Infusion 4 MG/5ML: INTRAVENOUS | Qty: 3.75 | Status: AC

## 2022-06-21 MED FILL — Dexamethasone Sodium Phosphate Inj 100 MG/10ML: INTRAMUSCULAR | Qty: 1 | Status: AC

## 2022-06-22 ENCOUNTER — Inpatient Hospital Stay: Payer: Medicare Other

## 2022-06-22 VITALS — BP 155/71 | HR 84 | Temp 98.1°F | Resp 18 | Ht 60.25 in | Wt 102.0 lb

## 2022-06-22 DIAGNOSIS — R200 Anesthesia of skin: Secondary | ICD-10-CM | POA: Diagnosis not present

## 2022-06-22 DIAGNOSIS — Z5112 Encounter for antineoplastic immunotherapy: Secondary | ICD-10-CM | POA: Diagnosis present

## 2022-06-22 DIAGNOSIS — Z9012 Acquired absence of left breast and nipple: Secondary | ICD-10-CM | POA: Diagnosis not present

## 2022-06-22 DIAGNOSIS — Z85038 Personal history of other malignant neoplasm of large intestine: Secondary | ICD-10-CM | POA: Diagnosis not present

## 2022-06-22 DIAGNOSIS — C50412 Malignant neoplasm of upper-outer quadrant of left female breast: Secondary | ICD-10-CM | POA: Diagnosis present

## 2022-06-22 DIAGNOSIS — C787 Secondary malignant neoplasm of liver and intrahepatic bile duct: Secondary | ICD-10-CM

## 2022-06-22 DIAGNOSIS — Z171 Estrogen receptor negative status [ER-]: Secondary | ICD-10-CM | POA: Diagnosis not present

## 2022-06-22 DIAGNOSIS — R600 Localized edema: Secondary | ICD-10-CM | POA: Diagnosis not present

## 2022-06-22 DIAGNOSIS — I890 Lymphedema, not elsewhere classified: Secondary | ICD-10-CM | POA: Diagnosis not present

## 2022-06-22 DIAGNOSIS — C7951 Secondary malignant neoplasm of bone: Secondary | ICD-10-CM | POA: Diagnosis not present

## 2022-06-22 DIAGNOSIS — K5900 Constipation, unspecified: Secondary | ICD-10-CM | POA: Diagnosis not present

## 2022-06-22 DIAGNOSIS — C778 Secondary and unspecified malignant neoplasm of lymph nodes of multiple regions: Secondary | ICD-10-CM | POA: Diagnosis not present

## 2022-06-22 DIAGNOSIS — C171 Malignant neoplasm of jejunum: Secondary | ICD-10-CM | POA: Diagnosis not present

## 2022-06-22 MED ORDER — PALONOSETRON HCL INJECTION 0.25 MG/5ML
0.2500 mg | Freq: Once | INTRAVENOUS | Status: AC
  Administered 2022-06-22: 0.25 mg via INTRAVENOUS
  Filled 2022-06-22: qty 5

## 2022-06-22 MED ORDER — SODIUM CHLORIDE 0.9% FLUSH
10.0000 mL | INTRAVENOUS | Status: DC | PRN
  Administered 2022-06-22: 10 mL

## 2022-06-22 MED ORDER — DIPHENHYDRAMINE HCL 25 MG PO CAPS
50.0000 mg | ORAL_CAPSULE | Freq: Once | ORAL | Status: AC
  Administered 2022-06-22: 50 mg via ORAL
  Filled 2022-06-22: qty 2

## 2022-06-22 MED ORDER — HEPARIN SOD (PORK) LOCK FLUSH 100 UNIT/ML IV SOLN
500.0000 [IU] | Freq: Once | INTRAVENOUS | Status: AC | PRN
  Administered 2022-06-22: 500 [IU]

## 2022-06-22 MED ORDER — ACETAMINOPHEN 325 MG PO TABS
650.0000 mg | ORAL_TABLET | Freq: Once | ORAL | Status: AC
  Administered 2022-06-22: 650 mg via ORAL
  Filled 2022-06-22: qty 2

## 2022-06-22 MED ORDER — SODIUM CHLORIDE 0.9 % IV SOLN
10.0000 mg | Freq: Once | INTRAVENOUS | Status: AC
  Administered 2022-06-22: 10 mg via INTRAVENOUS
  Filled 2022-06-22: qty 10

## 2022-06-22 MED ORDER — DEXTROSE 5 % IV SOLN: Freq: Once | INTRAVENOUS | Status: AC

## 2022-06-22 MED ORDER — ZOLEDRONIC ACID 4 MG/5ML IV CONC
3.0000 mg | Freq: Once | INTRAVENOUS | Status: AC
  Administered 2022-06-22: 3 mg via INTRAVENOUS
  Filled 2022-06-22: qty 3.75

## 2022-06-22 MED ORDER — FAM-TRASTUZUMAB DERUXTECAN-NXKI CHEMO 100 MG IV SOLR
4.0500 mg/kg | Freq: Once | INTRAVENOUS | Status: AC
  Administered 2022-06-22: 200 mg via INTRAVENOUS
  Filled 2022-06-22: qty 10

## 2022-06-22 NOTE — Patient Instructions (Signed)
[facility]  Discharge Instructions: Thank you for choosing [facility] to provide your oncology and hematology care.  If you have a lab appointment with the [facility], please go directly to the [facility] and check in at the registration area.   Wear comfortable clothing and clothing appropriate for easy access to any Portacath or PICC line.   We strive to give you quality time with your provider. You may need to reschedule your appointment if you arrive late (15 or more minutes).  Arriving late affects you and other patients whose appointments are after yours.  Also, if you miss three or more appointments without notifying the office, you may be dismissed from the clinic at the provider's discretion.      For prescription refill requests, have your pharmacy contact our office and allow 72 hours for refills to be completed.    Today you received the following chemotherapy and/or immunotherapy agents     To help prevent nausea and vomiting after your treatment, we encourage you to take your nausea medication as directed.  BELOW ARE SYMPTOMS THAT SHOULD BE REPORTED IMMEDIATELY: *FEVER GREATER THAN 100.4 F (38 C) OR HIGHER *CHILLS OR SWEATING *NAUSEA AND VOMITING THAT IS NOT CONTROLLED WITH YOUR NAUSEA MEDICATION *UNUSUAL SHORTNESS OF BREATH *UNUSUAL BRUISING OR BLEEDING *URINARY PROBLEMS (pain or burning when urinating, or frequent urination) *BOWEL PROBLEMS (unusual diarrhea, constipation, pain near the anus) TENDERNESS IN MOUTH AND THROAT WITH OR WITHOUT PRESENCE OF ULCERS (sore throat, sores in mouth, or a toothache) UNUSUAL RASH, SWELLING OR PAIN  UNUSUAL VAGINAL DISCHARGE OR ITCHING   Items with * indicate a potential emergency and should be followed up as soon as possible or go to the Emergency Department if any problems should occur.  Please show the CHEMOTHERAPY ALERT CARD or IMMUNOTHERAPY ALERT CARD at check-in to the Emergency  Department and triage nurse.  Should you have questions after your visit or need to cancel or reschedule your appointment, please contact [facility]  Dept: [phone]  and follow the prompts.  Office hours are 8:00 a.m. to 4:30 p.m. Monday - Friday. Please note that voicemails left after 4:00 p.m. may not be returned until the following business day.  We are closed weekends and major holidays. You have access to a nurse at all times for urgent questions. Please call the main number to the clinic Dept: [phone] and follow the prompts.  For any non-urgent questions, you may also contact your provider using MyChart. We now offer e-Visits for anyone 60 and older to request care online for non-urgent symptoms. For details visit [email or web address].   Also download the MyChart app! Go to the app store, search "MyChart", open the app, select [facility], and log in with your MyChart username and password.  Fam-Trastuzumab Deruxtecan Injection What is this medication? FAM-TRASTUZUMAB DERUXTECAN (fam-tras TOOZ eu mab DER ux TEE kan) treats some types of cancer. It works by blocking a protein that causes cancer cells to grow and multiply. This helps to slow or stop the spread of cancer cells. This medicine may be used for other purposes; ask your health care provider or pharmacist if you have questions. COMMON BRAND NAME(S): ENHERTU What should I tell my care team before I take this medication? They need to know if you have any of these conditions: Heart disease Heart failure Infection, especially a viral infection, such as chickenpox, cold sores, or herpes Liver disease Lung or breathing  disease, such as asthma or COPD An unusual or allergic reaction to fam-trastuzumab deruxtecan, other medications, foods, dyes, or preservatives Pregnant or trying to get pregnant Breast-feeding How should I use this medication? This medication is injected into a vein. It is given by  your care team in a hospital or clinic setting. A special MedGuide will be given to you before each treatment. Be sure to read this information carefully each time. Talk to your care team about the use of this medication in children. Special care may be needed. Overdosage: If you think you have taken too much of this medicine contact a poison control center or emergency room at once. NOTE: This medicine is only for you. Do not share this medicine with others. What if I miss a dose? It is important not to miss your dose. Call your care team if you are unable to keep an appointment. What may interact with this medication? Interactions are not expected. This list may not describe all possible interactions. Give your health care provider a list of all the medicines, herbs, non-prescription drugs, or dietary supplements you use. Also tell them if you smoke, drink alcohol, or use illegal drugs. Some items may interact with your medicine. What should I watch for while using this medication? Visit your care team for regular checks on your progress. Tell your care team if your symptoms do not start to get better or if they get worse. Your condition will be monitored carefully while you are receiving this medication. Do not become pregnant while taking this medication or for 7 months after stopping it. Women should inform their care team if they wish to become pregnant or think they might be pregnant. Men should not father a child while taking this medication and for 4 months after stopping it. There is potential for serious side effects to an unborn child. Talk to your care team for more information. Do not breast-feed an infant while taking this medication or for 7 months after the last dose. This medication has caused decreased sperm counts in some men. This may make it more difficult to father a child. Talk to your care team if you are concerned about your fertility. This medication may increase your risk  to bruise or bleed. Call your care team if you notice any unusual bleeding. Be careful brushing or flossing your teeth or using a toothpick because you may get an infection or bleed more easily. If you have any dental work done, tell your dentist you are receiving this medication. This medication may cause dry eyes and blurred vision. If you wear contact lenses, you may feel some discomfort. Lubricating eye drops may help. See your care team if the problem does not go away or is severe. This medication may increase your risk of getting an infection. Call your care team for advice if you get a fever, chills, sore throat, or other symptoms of a cold or flu. Do not treat yourself. Try to avoid being around people who are sick. Avoid taking medications that contain aspirin, acetaminophen, ibuprofen, naproxen, or ketoprofen unless instructed by your care team. These medications may hide a fever. What side effects may I notice from receiving this medication? Side effects that you should report to your care team as soon as possible: Allergic reactions--skin rash, itching, hives, swelling of the face, lips, tongue, or throat Dry cough, shortness of breath or trouble breathing Infection--fever, chills, cough, sore throat, wounds that don't heal, pain or trouble when passing urine,  general feeling of discomfort or being unwell Heart failure--shortness of breath, swelling of the ankles, feet, or hands, sudden weight gain, unusual weakness or fatigue Unusual bruising or bleeding Side effects that usually do not require medical attention (report these to your care team if they continue or are bothersome): Constipation Diarrhea Hair loss Muscle pain Nausea Vomiting This list may not describe all possible side effects. Call your doctor for medical advice about side effects. You may report side effects to FDA at 1-800-FDA-1088. Where should I keep my medication? This medication is given in a hospital or clinic.  It will not be stored at home. NOTE: This sheet is a summary. It may not cover all possible information. If you have questions about this medicine, talk to your doctor, pharmacist, or health care provider.  2023 [employer]/Gold Standard (2021-01-07 00:00:00)

## 2022-06-30 ENCOUNTER — Encounter: Payer: Self-pay | Admitting: Oncology

## 2022-07-05 ENCOUNTER — Encounter: Payer: Self-pay | Admitting: Oncology

## 2022-07-05 NOTE — Progress Notes (Signed)
Patient Care Team: [name], MD as PCP - General (Family Medicine) [name], MD as Consulting Physician (Oncology)  Clinic Day:07/07/22  Referring physician: [name], MD  ASSESSMENT & PLAN:  Assessment & Plan: Malignant neoplasm of upper-outer quadrant of left breast in female, estrogen receptor negative ([place]) Stage IIB (T2c N1 M0) HER2 receptor positive left breast cancer, diagnosed in June 2020. This was ER/PR negative. This was treated with neoadjuvant Kadcyla/Perjeta and left mastectomy, with a complete response in breast and nodes. She had only received two doses of adjuvant Herceptin before being discontinued in December 2020.   History of colon cancer, stage II History of stage II colon cancer, diagnosed in May 2007. This was treated with surgical resection and adjuvant chemotherapy with Xeloda. Colonoscopy from 2013 revealed 2 polyps which were resected and chronic diverticulosis.   Malignant neoplasm metastatic to liver [facility]) Liver metastases discovered 04/17/21.  Numerous heterogeneously enhancing, internally necrotic lesions in the right hepatic lobe, consistent with metastatic disease. Largest lesion measures up to 6.7 cm and there are at least 6 lesions. We now have biopsy proven HER2 positive recurrent breast cancer with ER/PR negative. She received THP and tolerated it without significant difficulty.  CT scan showed excellent response with significant decrease in the size of her liver lesions. She later had progression of this disease and was switched to Enhertu.    Malignant neoplasm metastatic to bone [facility]) Bone metastases discovered 04/17/21. Enhancing osseous lesions are present in the L2 vertebral body, both iliac bones, and in the sacrum, highly concerning for osseous metastases. She received her 1st dose of zoledronic acid on January 16th.  I once again today explained to her and her son the reasoning for this medicine.  Her widespread bone metastases  show an interval increase in sclerosis of previously lytic lesions consistent with treatment response.   Chest wall recurrence of breast cancer, left ([name]) Left supraclavicular lymph node measuring 2-3 cm. Also involvement of subpectoral node and chest wall skin. This was rapidly progressive despite receiving  trastuzumab.  We have added in Perjeta and Taxotere to her current regimen and this has been very effective.  The CT scan also shows good response of her adenopathy with resolution of the previous bulky left axillary and subpectoral adenopathy. In September of 2023, she had a 2 cm nodule  located close to the left shoulder associated with nodularity of the left upper chest wall and appeared to have obvious progression of disease again. She was changed to [facility] and is responding well. The nodules have completely resolved.   Lymphedema She does have lymphedema of the left arm; however, this is better today, but with significant edema of the hand as well. We will refer her to a lymphedema specialist and she requests one in [place], so we will arrange that. She sleeps with the hand and arm elevated.  She has decreased use of her left hand but I think this is due to more than just the lymphedema.She will be seeing the neurologist to complete the nerve conduction velocity test on her hand in March.   Plan: Day 1 cycle 9 is scheduled on 07/13/2022. She will have a nerve conduction test on her hand on 07/14/2022. I will see her back in 3 weeks with CBC, CMP, and she will have a CT of chest, abdomen, and pelvis on 07/27/2022. She no longer has any visible or palpable disease. She will continue with treatment next week. The patient and her son  understand the plans discussed today and are in agreement with them.  She knows to contact our office if she develops concerns prior to her next appointment.  I provided 20 minutes of face-to-face time during this this encounter and > 50% was spent counseling as  documented under my assessment and plan.    [name], MD  [place] [address] [place] [address] Dept: [phone] Dept Fax: [phone]   CHIEF COMPLAINT:  CC: An 89 year old female with metastatic breast cancer   Current Treatment:  Planning Enhertu  INTERVAL HISTORY:  Sunita is here today for repeat clinical assessment for metastatic breast cancer. Patient states that she feels about the same and complains of tingles and a occasional sharp pain in her left hand. She uses Senokot, but occasionally needs Ex-lax when constipated. She continues to have constant tearing of her eyes. Her CBC is normal and her CMP today is pending. I advised her to make sure she is drinking plenty of fluids since her BUN was 22 last time. She will have a nerve conduction velocity test on her hand on 07/14/2022. It has been over 6 months since her last scan and I will schedule her for a CT of chest, abdomen, and pelvis on 07/27/2022 before her next appointment with me. Day 1 cycle 9 is scheduled on 07/13/2022. I will see her back in 3 weeks with CBC, CMP, and CT of chest, abdomen, and pelvis. She denies signs of infection such as sore throat, sinus drainage, cough, or urinary symptoms.  She denies fevers or recurrent chills. She denies pain. She denies nausea, vomiting, chest pain, dyspnea or cough. Her weight has been stable. She is accompanied at today's visit with son Suezanne Jacquet.    I have reviewed the past medical history, past surgical history, social history and family history with the patient and they are unchanged from previous note.  ALLERGIES:  has No Known Allergies.  MEDICATIONS:  Current Outpatient Medications  Medication Sig Dispense Refill   Biotin 1 MG CAPS Take by mouth.     fish oil-omega-3 fatty acids 1000 MG capsule Take 1 g by mouth daily.     gabapentin (NEURONTIN) 100 MG capsule Take 1 capsule (100 mg total)  by mouth at bedtime. (Patient not taking: Reported on 03/05/2022) 30 capsule 5   magnesium citrate SOLN Take 0.5 Bottles by mouth as needed for severe constipation.     Multiple Vitamin (MULTIVITAMIN) capsule Take 1 capsule by mouth daily.     ondansetron (ZOFRAN) 4 MG tablet Take 1 tablet (4 mg total) by mouth every 8 (eight) hours as needed for nausea or vomiting. (Patient not taking: Reported on 03/05/2022) 20 tablet 2   polyethylene glycol (MIRALAX / GLYCOLAX) 17 g packet Take 17 g by mouth as needed. constipation     No current facility-administered medications for this visit.   HISTORY OF PRESENT ILLNESS:   Oncology History Overview Note  Cancer Staging Malignant neoplasm of upper-outer quadrant of left breast in female, estrogen receptor negative ([place]) Staging form: Breast, AJCC 8th Edition - Clinical stage from 10/11/2018: Stage IIB (cT2, cN1, cM0, G3, ER-, PR-, HER2+) - Signed by Truitt Merle, MD on 11/02/2018 - Clinical: No stage assigned - Unsigned    History of colon cancer, stage II  09/2005 Initial Diagnosis   stage II adenocarcinoma of the sigmoid colon diagnosed in May 2007   09/21/2005 Surgery   She underwent surgical  resection on 09/21/2005. Findings were a 5.9 x 3.8 x 1.2 cm moderate to well-differentiated adenocarcinoma penetrating the muscular wall. Forty lymph nodes negative. No vascular or lymphatic invasion. Multiple diverticula with microabscess formation and inflammation. Carcinoma present in at least 1 diverticulum. Preop CEA 5.2 with lab normal range 0 to 2.5. Preop CT scan with no obvious additional pathology.    2007 -  Chemotherapy   She received oral Xeloda chemotherapy as an adjuvant. She declined treatment on a clinical trial.    11/12/2011 Initial Diagnosis   [name], stage II   12/09/2011 Procedure   Followup colonoscopy done on 12/09/2011. She was found to have 2 polyps which were removed. Chronic diverticulosis.     Malignant neoplasm of  upper-outer quadrant of left breast in female, estrogen receptor negative ([place])  09/26/2018 Mammogram   Mammogram/US of left breast 09/26/18 IMPRESSION:  1. 2.9cm irregular mass in the upper outer left breast corresponds to the palpable abnormality. This is highly suspicious for breast carcinoma.  2. Two adjacent abnormal left axillary  Lymph nodes suspicious for metastatic adenopathy. There is a third borderline abnormal left axillary LN with a cortex thickened to 47m.  3. Benign right breast cyst. No evidence of right breast malignancy.    10/11/2018 Cancer Staging   Staging form: Breast, AJCC 8th Edition - Clinical stage from 10/11/2018: Stage IIB (cT2, cN1, cM0, G3, ER-, PR-, HER2+) - Signed by [name] MD on 11/02/2018   10/11/2018 Initial Biopsy   Diagnosis 10/11/18 1. Breast, left, needle core biopsy, upper outer left 2 o'clock - INVASIVE DUCTAL CARCINOMA. - DUCTAL CARCINOMA IN SITU. - LYMPHOVASCULAR INVASION IS IDENTIFIED. - SEE COMMENT. 2. Lymph node, needle/core biopsy, left axilla - INVASIVE DUCTAL CARCINOMA. - SEE COMMENT.   10/11/2018 Receptors her2   The tumor cells are POSITIVE for Her2 (3+). Estrogen Receptor: 0%, NEGATIVE Progesterone Receptor: 0%, NEGATIVE Proliferation Marker Ki67: 70%   11/02/2018 Initial Diagnosis   Malignant neoplasm of upper-outer quadrant of left breast in female, estrogen receptor negative ([place]   11/15/2018 Breast MRI   MRI breast 11/15/18  IMPRESSION: 1. 3.3 centimeter mass in the LATERAL portion of the LEFT breast consistent with known malignancy. 2. There is significant non mass enhancement surrounding this mass and extending anteriorly into the nipple base, with largest diameter in the anterior to posterior axis, measuring 7.2 centimeters. 3. If the patient would consider breast conservation, additional MR guided core biopsies are recommended. Consider biopsy of the inferior and anterior extent of the non mass enhancement to document extent of  disease. 4. Three enlarged LEFT axillary lymph nodes. 5. RIGHT breast is negative.   11/15/2018 PET scan   PET 11/15/18 IMPRESSION: Hypermetabolic left breast lesion compatible with known primary. Hypermetabolic left axillary lymph nodes are consistent with metastatic disease.   No evidence for additional hypermetabolic metastatic involvement in the neck, chest, abdomen, or pelvis.   11/17/2018 - 03/01/2019 Chemotherapy   Neo-adjuvant Kadcyla and perjeta q3weeks for 6 cycles starting 11/17/18. Stopped before surgery    11/29/2018 Pathology Results   Diagnosis 1. Breast, left, needle core biopsy, inferior anterior (cylinder clip) - INVASIVE DUCTAL CARCINOMA. - DUCTAL CARCINOMA IN SITU. - LYMPHOVASCULAR INVASION IS IDENTIFIED. - SEE COMMENT. 2. Breast, left, needle core biopsy, central posterior (barbell clip) - INVASIVE DUCTAL CARCINOMA. - LYMPHOVASCULAR INVASION IS IDENTIFIED.   02/12/2019 Breast MRI   IMPRESSION: 1. Complete resolution of previously identified enhancing mass and associated non mass enhancement within the left breast. This is  consistent with excellent response to chemotherapy. No residual or suspicious findings are identified. 2. No MRI evidence of malignancy on the right. 3. Previously identified left axillary lymphadenopathy not definitively seen on today's study. However, this may be due to decreased field-of-view compared to prior study.     03/14/2019 Cancer Staging   Staging form: Breast, AJCC 8th Edition - Pathologic stage from 03/14/2019: pT0, pN0, cM0, GX, ER: Unknown, PR: Unknown, HER2: Not Assessed - Signed by Truitt Merle, MD on 03/28/2019   03/14/2019 Surgery   LEFT MASTECTOMY WITH TARGETED LYMPH NODE DISSECTION and Left Axillary Sentinel Lymph  Node Biopsy by Dr [name] 03/14/19    03/14/2019 Pathology Results   FINAL MICROSCOPIC DIAGNOSIS:   A. LYMPH NODE, LEFT, SENTINEL, BIOPSY:  - There is no evidence of carcinoma in 1 of 1 lymph node (0/1).   B.  BREAST, LEFT, MASTECTOMY:  - Benign breast parenchyma with treatment-related changes.  - There is no evidence of malignancy.  - See oncology table below.   C. LYMPH NODE, LEFT #1, SENTINEL, BIOPSY:  - There is no evidence of carcinoma in 1 of 1 lymph node (0/1).   D. LYMPH NODE, LEFT #2, SENTINEL, BIOPSY:  - There is no evidence of carcinoma in 1 of 1 lymph node (0/1).     04/04/2019 - 04/25/2019 Chemotherapy   Maintenance Herceptin injections every 3 weeks starting 04/03/19 to complete 1 year of treatment that was started in 11/2018. Stopped after 2nd dose as she will not be repeating [place].    06/03/2021 - 06/24/2021 Chemotherapy   Patient is on Treatment Plan : BREAST Trastuzumab q21d X 11 Cycles     06/25/2021 - 01/01/2022 Chemotherapy   Patient is on Treatment Plan : BREAST Docetaxel + Trastuzumab + Pertuzumab (THP) q21d     06/25/2021 - 01/02/2022 Chemotherapy   Patient is on Treatment Plan : BREAST Docetaxel + Trastuzumab + Pertuzumab (THP) q21d     09/14/2021 Imaging   CT chest/abdomen/pelvis  IMPRESSION:  1. Interval resolution of previously seen bulky left axillary and  subpectoral lymphadenopathy. No persistently enlarged lymph nodes.  2. Multiple liver lesions are significantly diminished in size.  3. Widespread osseous metastatic disease, with an interval increase  in sclerosis of several previously lytic lesions.  4. Constellation of findings is consistent with treatment response  of metastatic disease  5. New, although age indeterminate pathologic wedge deformity of the  T6 vertebral body as well as an increased pathologic wedge deformity  of the L2 vertebral body.  6. Coronary artery disease.  Aortic Atherosclerosis (ICD10-I70.0).    01/26/2022 -  Chemotherapy   Patient is on Treatment Plan : BREAST METASTATIC Fam-Trastuzumab Deruxtecan-nxki (Enhertu) (5.4) q21d     Malignant neoplasm metastatic to liver ([place])  04/17/2021 Imaging   CT ABDOMEN AND PELVIS WITH  CONTRAST: -New lytic lesion involving the L2 vertebral body (6:72, 2:26) with minimal cortical breakthrough superiorly, but with preservation of vertebral body height, concerning for metastatic disease.  -There are several indeterminate hepatic lesions as described above. In addition to the lesions described above, there is an additional subtle 1.5 cm hypodensity in the hepatic dome (2:7). Given clinical history and presence of a new L2 lesion, leading differential consideration is metastatic disease. Recommend further evaluation with MRI of the abdomen with and without contrast.   05/12/2021 Imaging   MRI ABDOMEN WITH AND WITHOUT CONTRAST: Numerous heterogeneously enhancing, internally necrotic lesions in the right hepatic lobe, highly concerning for metastatic disease. Largest lesion measures  up to 6.7 cm in hepatic segment VI.   Enhancing osseous lesions in the L2 vertebral body, both iliac bones, and in the sacrum, highly concerning for osseous metastases.   05/15/2021 Initial Diagnosis   Liver metastases ([place])   05/27/2021 PET scan   1. Widespread recurrent/metastatic disease in this patient who is  status post bilateral mastectomy. Left chest wall recurrence with  left axillary/subpectoral, supraclavicular nodal metastasis.  2. Hepatic, left adrenal, and widespread osseous metastasis.  3. Incidental findings, including: Right nephrolithiasis. Tiny  hiatal hernia.    05/27/2021 Imaging   MRI LUMBAR AND THORACIC SPINE: Thoracic spine:  Osseous metastatic disease involving each level. The most dramatic deposits are at T4 and T6 where there is extraosseous/epidural tumor. No cord compression but there is foraminal impingement on the left at T6-7 and on the right at T3-4. Early dorsal epidural tumor at the level of T10 and T3.   Lumbar spine:  1. Widespread osseous metastatic disease with largest deposit  replacing the L2 body where there is a compression fracture with  mild height loss. Also  at this level is early epidural tumor  extension likely affecting both L2-3 foramina.  2. 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    05/27/2021 Imaging   MRI HEAD WITH AND WITHOUT CONTRAST: Negative for metastatic disease to the brain or calvarium.   06/03/2021 - 06/24/2021 Chemotherapy   Patient is on Treatment Plan : BREAST Trastuzumab q21d X 11 Cycles     06/25/2021 - 01/01/2022 Chemotherapy   Patient is on Treatment Plan : BREAST Docetaxel + Trastuzumab + Pertuzumab (THP) q21d     06/25/2021 - 01/02/2022 Chemotherapy   Patient is on Treatment Plan : BREAST Docetaxel + Trastuzumab + Pertuzumab (THP) q21d     09/14/2021 Imaging   CT chest/abdomen/pelvis  IMPRESSION:  1. Interval resolution of previously seen bulky left axillary and  subpectoral lymphadenopathy. No persistently enlarged lymph nodes.  2. Multiple liver lesions are significantly diminished in size.  3. Widespread osseous metastatic disease, with an interval increase  in sclerosis of several previously lytic lesions.  4. Constellation of findings is consistent with treatment response  of metastatic disease  5. New, although age indeterminate pathologic wedge deformity of the  T6 vertebral body as well as an increased pathologic wedge deformity  of the L2 vertebral body.  6. Coronary artery disease.  Aortic Atherosclerosis (ICD10-I70.0).    01/26/2022 -  Chemotherapy   Patient is on Treatment Plan : BREAST METASTATIC Fam-Trastuzumab Deruxtecan-nxki (Enhertu) (5.4) q21d     Malignant neoplasm metastatic to bone ([place])  04/17/2021 Imaging   CT ABDOMEN AND PELVIS WITH CONTRAST: -New lytic lesion involving the L2 vertebral body (6:72, 2:26) with minimal cortical breakthrough superiorly, but with preservation of vertebral body height, concerning for metastatic disease.  -There are several indeterminate hepatic lesions as described above. In addition to the lesions described above, there is an  additional subtle 1.5 cm hypodensity in the hepatic dome (2:7). Given clinical history and presence of a new L2 lesion, leading differential consideration is metastatic disease. Recommend further evaluation with MRI of the abdomen with and without contrast.   05/12/2021 Imaging   MRI ABDOMEN WITH AND WITHOUT CONTRAST: Numerous heterogeneously enhancing, internally necrotic lesions in the right hepatic lobe, highly concerning for metastatic disease. Largest lesion measures up to 6.7 cm in hepatic segment VI.   Enhancing osseous lesions in the L2 vertebral body, both iliac bones, and in the sacrum, highly concerning  for osseous metastases.   05/15/2021 Initial Diagnosis   Bone metastases ([place])   05/27/2021 PET scan   1. Widespread recurrent/metastatic disease in this patient who is  status post bilateral mastectomy. Left chest wall recurrence with  left axillary/subpectoral, supraclavicular nodal metastasis.  2. Hepatic, left adrenal, and widespread osseous metastasis.  3. Incidental findings, including: Right nephrolithiasis. Tiny  hiatal hernia.    05/27/2021 Imaging   MRI LUMBAR AND THORACIC SPINE: Thoracic spine:  Osseous metastatic disease involving each level. The most dramatic deposits are at T4 and T6 where there is extraosseous/epidural tumor. No cord compression but there is foraminal impingement on the left at T6-7 and on the right at T3-4. Early dorsal epidural tumor at the level of T10 and T3.   Lumbar spine:  1. Widespread osseous metastatic disease with largest deposit  replacing the L2 body where there is a compression fracture with  mild height loss. Also at this level is early epidural tumor  extension likely affecting both L2-3 foramina.  2. 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    05/27/2021 Imaging   MRI HEAD WITH AND WITHOUT CONTRAST: Negative for metastatic disease to the brain or calvarium.   06/03/2021 - 06/24/2021 Chemotherapy    Patient is on Treatment Plan : BREAST Trastuzumab q21d X 11 Cycles     06/25/2021 - 01/01/2022 Chemotherapy   Patient is on Treatment Plan : BREAST Docetaxel + Trastuzumab + Pertuzumab (THP) q21d     06/25/2021 - 01/02/2022 Chemotherapy   Patient is on Treatment Plan : BREAST Docetaxel + Trastuzumab + Pertuzumab (THP) q21d     09/14/2021 Imaging   CT chest/abdomen/pelvis  IMPRESSION:  1. Interval resolution of previously seen bulky left axillary and  subpectoral lymphadenopathy. No persistently enlarged lymph nodes.  2. Multiple liver lesions are significantly diminished in size.  3. Widespread osseous metastatic disease, with an interval increase  in sclerosis of several previously lytic lesions.  4. Constellation of findings is consistent with treatment response  of metastatic disease  5. New, although age indeterminate pathologic wedge deformity of the  T6 vertebral body as well as an increased pathologic wedge deformity  of the L2 vertebral body.  6. Coronary artery disease.  Aortic Atherosclerosis (ICD10-I70.0).    01/26/2022 -  Chemotherapy   Patient is on Treatment Plan : BREAST METASTATIC Fam-Trastuzumab Deruxtecan-nxki (Enhertu) (5.4) q21d       REVIEW OF SYSTEMS:   Review of Systems  Constitutional: Negative.  Negative for appetite change, chills, diaphoresis, fatigue, fever and unexpected weight change.  HENT:  Negative.  Negative for hearing loss, lump/mass, mouth sores, nosebleeds, sore throat, tinnitus, trouble swallowing and voice change.   Eyes:  Positive for eye problems (tearing of the eye.). Negative for icterus.  Respiratory: Negative.  Negative for chest tightness, cough, hemoptysis, shortness of breath and wheezing.   Cardiovascular: Negative.  Negative for chest pain, leg swelling and palpitations.  Gastrointestinal:  Positive for constipation (occasionally). Negative for abdominal distention, abdominal pain, blood in stool, diarrhea, nausea, rectal pain and  vomiting.  Endocrine: Negative.   Genitourinary: Negative.  Negative for bladder incontinence, difficulty urinating, dyspareunia, dysuria, frequency, hematuria, menstrual problem, nocturia, pelvic pain, vaginal bleeding and vaginal discharge.   Musculoskeletal: Negative.  Negative for arthralgias, back pain, flank pain, gait problem, myalgias, neck pain and neck stiffness.  Skin: Negative.  Negative for itching, rash and wound.  Neurological:  Positive for numbness (severe paresthesias inability to use her left hand). Negative for  dizziness, extremity weakness, gait problem, headaches, light-headedness, seizures and speech difficulty.  Hematological: Negative.  Negative for adenopathy. Does not bruise/bleed easily.  Psychiatric/Behavioral: Negative.  Negative for confusion, decreased concentration, depression, sleep disturbance and suicidal ideas. The patient is not nervous/anxious.    VITALS:  Blood pressure 131/68, pulse 78, temperature 98.8 F (37.1 C), temperature source Oral, resp. rate 18, height 5' 0.25" (1.53 m), weight 108 lb 14.4 oz (49.4 kg), SpO2 98 %.  Wt Readings from Last 3 Encounters:  07/13/22 110 lb 6.4 oz (50.1 kg)  07/07/22 108 lb 14.4 oz (49.4 kg)  06/22/22 102 lb 0.6 oz (46.3 kg)    Body mass index is 21.09 kg/m.  Performance status (ECOG): 1 - Symptomatic but completely ambulatory  PHYSICAL EXAM:  Physical Exam Vitals and nursing note reviewed. Exam conducted with a chaperone present.  Constitutional:      General: She is not in acute distress.    Appearance: Normal appearance. She is normal weight. She is not ill-appearing, toxic-appearing or diaphoretic.  HENT:     Head: Normocephalic and atraumatic.     Right Ear: Tympanic membrane, ear canal and external ear normal. There is no impacted cerumen.     Left Ear: Tympanic membrane, ear canal and external ear normal. There is no impacted cerumen.     Nose: Nose normal. No congestion or rhinorrhea.      Mouth/Throat:     Mouth: Mucous membranes are moist.     Pharynx: Oropharynx is clear. No oropharyngeal exudate or posterior oropharyngeal erythema.  Eyes:     General: No scleral icterus.       Right eye: No discharge.        Left eye: No discharge.     Extraocular Movements: Extraocular movements intact.     Conjunctiva/sclera: Conjunctivae normal.     Pupils: Pupils are equal, round, and reactive to light.  Neck:     Vascular: No carotid bruit.  Cardiovascular:     Rate and Rhythm: Normal rate and regular rhythm.     Pulses: Normal pulses.     Heart sounds: Normal heart sounds. No murmur heard.    No friction rub. No gallop.  Pulmonary:     Effort: Pulmonary effort is normal. No respiratory distress.     Breath sounds: Normal breath sounds. No stridor. No wheezing, rhonchi or rales.  Chest:     Chest wall: No tenderness.  Breasts:    Right: Normal.     Left: Absent.     Comments: Left mastectomy is negative.  Right breast has no masses.  Abdominal:     General: Bowel sounds are normal. There is no distension.     Palpations: Abdomen is soft. There is no mass.     Tenderness: There is no abdominal tenderness. There is no right CVA tenderness, left CVA tenderness, guarding or rebound.     Hernia: No hernia is present.  Musculoskeletal:        General: No swelling, tenderness, deformity or signs of injury. Normal range of motion.     Cervical back: Normal range of motion and neck supple. No rigidity or tenderness.     Right lower leg: No edema.     Left lower leg: No edema.     Comments: Mild lymphedema of the left arm  Lymphadenopathy:     Cervical: No cervical adenopathy.  Skin:    General: Skin is warm and dry.     Coloration: Skin is not jaundiced  or pale.     Findings: No bruising, erythema (mild), lesion or rash.  Neurological:     Mental Status: She is alert and oriented to person, place, and time. Mental status is at baseline.     Cranial Nerves: No cranial nerve  deficit.     Sensory: No sensory deficit.     Motor: Weakness (She has severe paresthesias of the left upper extremity with inability to use her left hand) present.     Coordination: Coordination normal.     Gait: Gait normal.     Deep Tendon Reflexes: Reflexes normal.  Psychiatric:        Mood and Affect: Mood normal.        Behavior: Behavior normal.        Thought Content: Thought content normal.        Judgment: Judgment normal.    LABORATORY DATA:  I have reviewed the data as listed    Component Value Date/Time   NA 135 (A) 07/07/2022 0000   NA 137 11/13/2012 1057   K 4.4 07/07/2022 0000   K 4.5 11/13/2012 1057   CL 107 07/07/2022 0000   CO2 23 (A) 07/07/2022 0000   CO2 26 11/13/2012 1057   GLUCOSE 103 (H) 05/06/2022 1002   GLUCOSE 90 11/13/2012 1057   BUN 16 07/07/2022 0000   BUN 18.3 11/13/2012 1057   CREATININE 0.6 07/07/2022 0000   CREATININE 0.53 05/06/2022 1002   CREATININE 0.8 11/13/2012 1057   CALCIUM 9.1 07/07/2022 0000   CALCIUM 9.7 11/13/2012 1057   PROT 6.2 (L) 05/06/2022 1002   PROT 7.4 11/13/2012 1057   ALBUMIN 3.3 (A) 07/07/2022 0000   ALBUMIN 3.9 11/13/2012 1057   AST 37 (A) 07/07/2022 0000   AST 28 05/06/2022 1002   AST 22 11/13/2012 1057   ALT 19 07/07/2022 0000   ALT 14 05/06/2022 1002   ALT 15 11/13/2012 1057   ALKPHOS 52 07/07/2022 0000   ALKPHOS 77 11/13/2012 1057   BILITOT 0.6 05/06/2022 1002   BILITOT 0.54 11/13/2012 1057   GFRNONAA >60 05/06/2022 1002   GFRAA >60 04/25/2019 1045   No results found for: "SPEP", "UPEP"  Lab Results  Component Value Date   WBC 4.0 07/07/2022   NEUTROABS 2.16 07/07/2022   HGB 12.9 07/07/2022   HCT 37 07/07/2022   MCV 100 (A) 04/15/2022   PLT 158 07/07/2022     Chemistry      Component Value Date/Time   NA 135 (A) 07/07/2022 0000   NA 137 11/13/2012 1057   K 4.4 07/07/2022 0000   K 4.5 11/13/2012 1057   CL 107 07/07/2022 0000   CO2 23 (A) 07/07/2022 0000   CO2 26 11/13/2012 1057   BUN 16  07/07/2022 0000   BUN 18.3 11/13/2012 1057   CREATININE 0.6 07/07/2022 0000   CREATININE 0.53 05/06/2022 1002   CREATININE 0.8 11/13/2012 1057   GLU 102 07/07/2022 0000      Component Value Date/Time   CALCIUM 9.1 07/07/2022 0000   CALCIUM 9.7 11/13/2012 1057   ALKPHOS 52 07/07/2022 0000   ALKPHOS 77 11/13/2012 1057   AST 37 (A) 07/07/2022 0000   AST 28 05/06/2022 1002   AST 22 11/13/2012 1057   ALT 19 07/07/2022 0000   ALT 14 05/06/2022 1002   ALT 15 11/13/2012 1057   BILITOT 0.6 05/06/2022 1002   BILITOT 0.54 11/13/2012 1057      RADIOGRAPHIC STUDIES: No results found.  I,Jasmine M Lassiter,acting as a scribe for [name], MD.,have documented all relevant documentation on the behalf of [name], MD,as directed by  [name], MD while in the presence of [name], MD.

## 2022-07-07 ENCOUNTER — Encounter: Payer: Self-pay | Admitting: Oncology

## 2022-07-07 ENCOUNTER — Other Ambulatory Visit: Payer: Self-pay | Admitting: Oncology

## 2022-07-07 ENCOUNTER — Inpatient Hospital Stay (INDEPENDENT_AMBULATORY_CARE_PROVIDER_SITE_OTHER): Payer: Medicare Other | Admitting: Oncology

## 2022-07-07 ENCOUNTER — Inpatient Hospital Stay: Payer: Medicare Other

## 2022-07-07 VITALS — BP 131/68 | HR 78 | Temp 98.8°F | Resp 18 | Ht 60.25 in | Wt 108.9 lb

## 2022-07-07 DIAGNOSIS — Z171 Estrogen receptor negative status [ER-]: Secondary | ICD-10-CM

## 2022-07-07 DIAGNOSIS — C787 Secondary malignant neoplasm of liver and intrahepatic bile duct: Secondary | ICD-10-CM

## 2022-07-07 DIAGNOSIS — C7951 Secondary malignant neoplasm of bone: Secondary | ICD-10-CM

## 2022-07-07 DIAGNOSIS — C50912 Malignant neoplasm of unspecified site of left female breast: Secondary | ICD-10-CM

## 2022-07-07 DIAGNOSIS — C50412 Malignant neoplasm of upper-outer quadrant of left female breast: Secondary | ICD-10-CM

## 2022-07-07 DIAGNOSIS — C7989 Secondary malignant neoplasm of other specified sites: Secondary | ICD-10-CM

## 2022-07-07 LAB — HEPATIC FUNCTION PANEL
ALT: 19 U/L (ref 7–35)
AST: 37 — AB (ref 13–35)
Alkaline Phosphatase: 52 (ref 25–125)
Bilirubin, Total: 0.4

## 2022-07-07 LAB — BASIC METABOLIC PANEL
BUN: 16 (ref 4–21)
CO2: 23 — AB (ref 13–22)
Chloride: 107 (ref 99–108)
Creatinine: 0.6 (ref 0.5–1.1)
Glucose: 102
Potassium: 4.4 mEq/L (ref 3.5–5.1)
Sodium: 135 — AB (ref 137–147)

## 2022-07-07 LAB — CBC AND DIFFERENTIAL
HCT: 37 (ref 36–46)
Hemoglobin: 12.9 (ref 12.0–16.0)
Neutrophils Absolute: 2.16
Platelets: 158 10*3/uL (ref 150–400)
WBC: 4

## 2022-07-07 LAB — COMPREHENSIVE METABOLIC PANEL
Albumin: 3.3 — AB (ref 3.5–5.0)
Calcium: 9.1 (ref 8.7–10.7)

## 2022-07-07 LAB — CBC: RBC: 3.56 — AB (ref 3.87–5.11)

## 2022-07-08 ENCOUNTER — Other Ambulatory Visit: Payer: Self-pay

## 2022-07-12 ENCOUNTER — Encounter: Payer: Self-pay | Admitting: Oncology

## 2022-07-12 MED FILL — Fam-Trastuzumab Deruxtecan-nxki For IV Soln 100 MG: INTRAVENOUS | Qty: 10 | Status: AC

## 2022-07-12 MED FILL — Dexamethasone Sodium Phosphate Inj 100 MG/10ML: INTRAMUSCULAR | Qty: 1 | Status: AC

## 2022-07-13 ENCOUNTER — Inpatient Hospital Stay: Payer: Medicare Other | Attending: Oncology

## 2022-07-13 VITALS — BP 133/54 | HR 73 | Temp 98.1°F | Resp 18 | Ht 60.25 in | Wt 110.4 lb

## 2022-07-13 DIAGNOSIS — C7951 Secondary malignant neoplasm of bone: Secondary | ICD-10-CM | POA: Diagnosis not present

## 2022-07-13 DIAGNOSIS — Z171 Estrogen receptor negative status [ER-]: Secondary | ICD-10-CM | POA: Diagnosis not present

## 2022-07-13 DIAGNOSIS — C787 Secondary malignant neoplasm of liver and intrahepatic bile duct: Secondary | ICD-10-CM | POA: Insufficient documentation

## 2022-07-13 DIAGNOSIS — C50412 Malignant neoplasm of upper-outer quadrant of left female breast: Secondary | ICD-10-CM | POA: Diagnosis present

## 2022-07-13 DIAGNOSIS — Z5112 Encounter for antineoplastic immunotherapy: Secondary | ICD-10-CM | POA: Insufficient documentation

## 2022-07-13 MED ORDER — PALONOSETRON HCL INJECTION 0.25 MG/5ML
0.2500 mg | Freq: Once | INTRAVENOUS | Status: AC
  Administered 2022-07-13: 0.25 mg via INTRAVENOUS
  Filled 2022-07-13: qty 5

## 2022-07-13 MED ORDER — SODIUM CHLORIDE 0.9% FLUSH
10.0000 mL | INTRAVENOUS | Status: DC | PRN
  Administered 2022-07-13: 10 mL

## 2022-07-13 MED ORDER — DIPHENHYDRAMINE HCL 25 MG PO CAPS
50.0000 mg | ORAL_CAPSULE | Freq: Once | ORAL | Status: AC
  Administered 2022-07-13: 50 mg via ORAL
  Filled 2022-07-13: qty 2

## 2022-07-13 MED ORDER — ACETAMINOPHEN 325 MG PO TABS
650.0000 mg | ORAL_TABLET | Freq: Once | ORAL | Status: AC
  Administered 2022-07-13: 650 mg via ORAL
  Filled 2022-07-13: qty 2

## 2022-07-13 MED ORDER — FAM-TRASTUZUMAB DERUXTECAN-NXKI CHEMO 100 MG IV SOLR
4.0500 mg/kg | Freq: Once | INTRAVENOUS | Status: AC
  Administered 2022-07-13: 200 mg via INTRAVENOUS
  Filled 2022-07-13: qty 10

## 2022-07-13 MED ORDER — DEXTROSE 5 % IV SOLN: Freq: Once | INTRAVENOUS | Status: AC

## 2022-07-13 MED ORDER — SODIUM CHLORIDE 0.9 % IV SOLN
10.0000 mg | Freq: Once | INTRAVENOUS | Status: AC
  Administered 2022-07-13: 10 mg via INTRAVENOUS
  Filled 2022-07-13: qty 10

## 2022-07-13 MED ORDER — HEPARIN SOD (PORK) LOCK FLUSH 100 UNIT/ML IV SOLN
500.0000 [IU] | Freq: Once | INTRAVENOUS | Status: AC | PRN
  Administered 2022-07-13: 500 [IU]

## 2022-07-13 NOTE — Patient Instructions (Signed)
[facility]  Discharge Instructions: Thank you for choosing [facility] to provide your oncology and hematology care.  If you have a lab appointment with the [facility], please go directly to the [facility] and check in at the registration area.   Wear comfortable clothing and clothing appropriate for easy access to any Portacath or PICC line.   We strive to give you quality time with your provider. You may need to reschedule your appointment if you arrive late (15 or more minutes).  Arriving late affects you and other patients whose appointments are after yours.  Also, if you miss three or more appointments without notifying the office, you may be dismissed from the clinic at the provider's discretion.      For prescription refill requests, have your pharmacy contact our office and allow 72 hours for refills to be completed.    Today you received the following chemotherapy and/or immunotherapy agents En[place]tu      To help prevent nausea and vomiting after your treatment, we encourage you to take your nausea medication as directed.  BELOW ARE SYMPTOMS THAT SHOULD BE REPORTED IMMEDIATELY: *FEVER GREATER THAN 100.4 F (38 C) OR HIGHER *CHILLS OR SWEATING *NAUSEA AND VOMITING THAT IS NOT CONTROLLED WITH YOUR NAUSEA MEDICATION *UNUSUAL SHORTNESS OF BREATH *UNUSUAL BRUISING OR BLEEDING *URINARY PROBLEMS (pain or burning when urinating, or frequent urination) *BOWEL PROBLEMS (unusual diarrhea, constipation, pain near the anus) TENDERNESS IN MOUTH AND THROAT WITH OR WITHOUT PRESENCE OF ULCERS (sore throat, sores in mouth, or a toothache) UNUSUAL RASH, SWELLING OR PAIN  UNUSUAL VAGINAL DISCHARGE OR ITCHING   Items with * indicate a potential emergency and should be followed up as soon as possible or go to the Emergency Department if any problems should occur.  Please show the CHEMOTHERAPY ALERT CARD or IMMUNOTHERAPY ALERT CARD at check-in to the  Emergency Department and triage nurse.  Should you have questions after your visit or need to cancel or reschedule your appointment, please contact [facility]  Dept: [phone]  and follow the prompts.  Office hours are 8:00 a.m. to 4:30 p.m. Monday - Friday. Please note that voicemails left after 4:00 p.m. may not be returned until the following business day.  We are closed weekends and major holidays. You have access to a nurse at all times for urgent questions. Please call the main number to the clinic Dept: [phone] and follow the prompts.  For any non-urgent questions, you may also contact your provider using MyChart. We now offer e-Visits for anyone 45 and older to request care online for non-urgent symptoms. For details visit [email or web address].   Also download the MyChart app! Go to the app store, search "MyChart", open the app, select [facility], and log in with your MyChart username and password.

## 2022-07-19 ENCOUNTER — Encounter: Payer: Self-pay | Admitting: Oncology

## 2022-07-21 ENCOUNTER — Encounter: Payer: Self-pay | Admitting: Oncology

## 2022-07-27 LAB — CBC AND DIFFERENTIAL
HCT: 39 (ref 36–46)
Hemoglobin: 13.3 (ref 12.0–16.0)
Neutrophils Absolute: 2.69
Platelets: 164 10*3/uL (ref 150–400)
WBC: 4.8

## 2022-07-27 LAB — BASIC METABOLIC PANEL
BUN: 15 (ref 4–21)
CO2: 25 — AB (ref 13–22)
Chloride: 106 (ref 99–108)
Creatinine: 0.5 (ref 0.5–1.1)
Glucose: 96
Potassium: 3.9 mEq/L (ref 3.5–5.1)
Sodium: 138 (ref 137–147)

## 2022-07-27 LAB — HEPATIC FUNCTION PANEL
ALT: 17 U/L (ref 7–35)
AST: 40 — AB (ref 13–35)
Alkaline Phosphatase: 54 (ref 25–125)
Bilirubin, Total: 0.8

## 2022-07-27 LAB — COMPREHENSIVE METABOLIC PANEL
Albumin: 3.9 (ref 3.5–5.0)
Calcium: 9.3 (ref 8.7–10.7)

## 2022-07-27 LAB — CBC: RBC: 3.76 — AB (ref 3.87–5.11)

## 2022-07-29 ENCOUNTER — Other Ambulatory Visit: Payer: Self-pay | Admitting: Oncology

## 2022-07-29 ENCOUNTER — Inpatient Hospital Stay (INDEPENDENT_AMBULATORY_CARE_PROVIDER_SITE_OTHER): Payer: Medicare Other | Admitting: Oncology

## 2022-07-29 ENCOUNTER — Encounter: Payer: Self-pay | Admitting: Oncology

## 2022-07-29 VITALS — BP 168/66 | HR 71 | Temp 98.0°F | Resp 18 | Ht 60.25 in | Wt 110.3 lb

## 2022-07-29 DIAGNOSIS — Z171 Estrogen receptor negative status [ER-]: Secondary | ICD-10-CM

## 2022-07-29 DIAGNOSIS — C787 Secondary malignant neoplasm of liver and intrahepatic bile duct: Secondary | ICD-10-CM

## 2022-07-29 DIAGNOSIS — C50412 Malignant neoplasm of upper-outer quadrant of left female breast: Secondary | ICD-10-CM

## 2022-07-29 DIAGNOSIS — C7989 Secondary malignant neoplasm of other specified sites: Secondary | ICD-10-CM

## 2022-07-29 DIAGNOSIS — C7951 Secondary malignant neoplasm of bone: Secondary | ICD-10-CM | POA: Diagnosis not present

## 2022-07-29 DIAGNOSIS — C50912 Malignant neoplasm of unspecified site of left female breast: Secondary | ICD-10-CM

## 2022-07-29 NOTE — Progress Notes (Signed)
Patient Care Team: Jasmin Giovanni, MD as PCP - General (Family Medicine) Jasmin Beckwith, MD as Consulting Physician (Oncology)  Clinic Day:07/29/22   Referring physician: [name], MD  ASSESSMENT & PLAN:  Assessment & Plan: Malignant neoplasm of upper-outer quadrant of left breast in female, estrogen receptor negative ([name]) Stage IIB (T2c N1 M0) HER2 receptor positive left breast cancer, diagnosed in June 2020. This was ER/PR negative. This was treated with neoadjuvant Kadcyla/Perjeta and left mastectomy, with a complete response in breast and nodes. She had only received two doses of adjuvant Herceptin before being discontinued in December 2020.   History of colon cancer, stage II History of stage II colon cancer, diagnosed in May 2007. This was treated with surgical resection and adjuvant chemotherapy with Xeloda. Colonoscopy from 2013 revealed 2 polyps which were resected and chronic diverticulosis.   Malignant neoplasm metastatic to liver [facility]) Liver metastases discovered 04/17/21, consisting of numerous heterogeneously enhancing, internally necrotic lesions in the right hepatic lobe, biopsy proven to be metastatic disease. The largest lesion measures up to 6.7 cm and there are at least 6 lesions. This is HER2 positive breast cancer with ER/PR negative. She received THP and tolerated it without significant difficulty.  CT scan showed excellent response with significant decrease in the size of her liver lesions. She later had progression of this disease in September of 2023, and was switched to Enhertu.    Malignant neoplasm metastatic to bone [facility]) Bone metastases discovered 04/17/21. Enhancing osseous lesions are present in the L2 vertebral body, both iliac bones, and in the sacrum, consistent with osseous metastases. She received her 1st dose of zoledronic acid on January 16th.  I have explained to her and her son the reasoning for this medicine.  Her widespread bone metastases  show an interval increase in sclerosis of previously lytic lesions consistent with treatment response.   Chest wall recurrence of breast cancer, left ([name]) Left supraclavicular lymph node measuring 2-3 cm, as well as involvement of subpectoral nodes and chest wall skin. This was rapidly progressive despite receiving  trastuzumab.  We have added in Perjeta and Taxotere to her current regimen and this was effective.  The CT scan also shows good response of her adenopathy with resolution of the previous bulky left axillary and subpectoral adenopathy. In September of 2023, she had a 2 cm nodule  located close to the left shoulder associated with nodularity of the left upper chest wall and appeared to have obvious progression of disease again. She was changed to [facility] and is responding well. The nodules have completely resolved.   Lymphedema She does have lymphedema of the left arm. We have referred her to a lymphedema specialist and she requested one in [place], so we will arrange that. She sleeps with the hand and arm elevated.  She has decreased use of her left hand but I think this is due to more than just the lymphedema.She will be seeing the neurologist to complete the nerve conduction velocity test on her hand in March.   Plan: Her CT chest, abdomen and pelvis on 07/27/22 was stable. I have suggested we run a Guardant 360 liquid test, which we can complete next visit to rule out any mutations she may have. I explained this to her and her son and they agree. She will continue with treatment next week. The patient and her son understand the plans discussed today and are in agreement with them. Their questions were answered. She knows to contact our  office if she develops concerns prior to her next appointment.  I provided 20 minutes of face-to-face time during this this encounter and > 50% was spent counseling as documented under my assessment and plan.    Jasmin Beckwith, MD  [facility] AT [facility] [address] [place] [address] Dept: [phone] Dept Fax: [phone]   CHIEF COMPLAINT:  CC: An 89 year old female with metastatic breast cancer   Current Treatment:  Planning Enhertu  INTERVAL HISTORY:  Jasmin Lewis is here today for repeat clinical assessment for metastatic breast cancer and discuss her most recent CT scans of 07/27/22. Her most recent CT of chest, abdomen, and pelvis were good, everything remained stable and one of the liver lesions is no longer visible. Patient states that she feels about the same and complains of tingles and a occasional sharp pain in her left hand. They have seen a neurologist but hasn't heard anything back about recommended treatments. She continues to have constant tearing of her eyes. CBC and CMP are unremarkable. I have suggested we run a Guardant 360 liquid test, which we can complete next visit, to rule out any mutations she may have. She is not sure how much further she would pursue aggressive treatment. I explained this to her and her son and they agree. She denies signs of infection such as sore throat, sinus drainage, or urinary symptoms. She denies fevers or recurrent chills. She denies pain. She denies nausea, vomiting, chest pain, dyspnea or cough. Her weight has increased 2 pounds since her last visit. She is accompanied at today's visit with son Jasmin Lewis.   I have reviewed the past medical history, past surgical history, social history and family history with the patient and they are unchanged from previous note.  ALLERGIES:  has No Known Allergies.  MEDICATIONS:  Current Outpatient Medications  Medication Sig Dispense Refill   Biotin 1 MG CAPS Take by mouth.     fish oil-omega-3 fatty acids 1000 MG capsule Take 1 g by mouth daily.     gabapentin (NEURONTIN) 100 MG capsule Take 1 capsule (100 mg total) by mouth at bedtime. (Patient not taking: Reported on 03/05/2022) 30 capsule  5   magnesium citrate SOLN Take 0.5 Bottles by mouth as needed for severe constipation.     Multiple Vitamin (MULTIVITAMIN) capsule Take 1 capsule by mouth daily.     ondansetron (ZOFRAN) 4 MG tablet Take 1 tablet (4 mg total) by mouth every 8 (eight) hours as needed for nausea or vomiting. (Patient not taking: Reported on 03/05/2022) 20 tablet 2   polyethylene glycol (MIRALAX / GLYCOLAX) 17 g packet Take 17 g by mouth as needed. constipation     No current facility-administered medications for this visit.   HISTORY OF PRESENT ILLNESS:   Oncology History Overview Note  Cancer Staging Malignant neoplasm of upper-outer quadrant of left breast in female, estrogen receptor negative ([name]) Staging form: Breast, AJCC 8th Edition - Clinical stage from 10/11/2018: Stage IIB (cT2, cN1, cM0, G3, ER-, PR-, HER2+) - Signed by Malachy Mood, MD on 11/02/2018 - Clinical: No stage assigned - Unsigned    History of colon cancer, stage II  09/2005 Initial Diagnosis   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 Findings were a 5.9 x 3.8 x 1.2 cm moderate to well-differentiated adenocarcinoma penetrating the muscular wall. Forty lymph nodes negative. No  vascular or lymphatic invasion. Multiple diverticula with microabscess formation and inflammation. Carcinoma present in at least 1 diverticulum. Preop CEA 5.2 with lab normal range 0 to 2.5. Preop CT scan with no obvious additional pathology.    2007 -  Chemotherapy   She received oral Xeloda chemotherapy as an adjuvant. She declined treatment on a clinical trial.    11/12/2011 Initial Diagnosis   [name], stage II   12/09/2011 Procedure   Followup colonoscopy done on 12/09/2011. She was found to have 2 polyps which were removed. Chronic diverticulosis.     Malignant neoplasm of upper-outer quadrant of left breast in female, estrogen receptor negative  09/26/2018  Mammogram   Mammogram/US of left breast 09/26/18 IMPRESSION:  1. 2.9cm irregular mass in the upper outer left breast corresponds to the palpable abnormality. This is highly suspicious for breast carcinoma.  2. Two adjacent abnormal left axillary  Lymph nodes suspicious for metastatic adenopathy. There is a third borderline abnormal left axillary LN with a cortex thickened to 4mm.  3. Benign right breast cyst. No evidence of right breast malignancy.    10/11/2018 Cancer Staging   Staging form: Breast, AJCC 8th Edition - Clinical stage from 10/11/2018: Stage IIB (cT2, cN1, cM0, G3, ER-, PR-, HER2+) - Signed by Malachy Mood, MD on 11/02/2018   10/11/2018 Initial Biopsy   Diagnosis 10/11/18 1. Breast, left, needle core biopsy, upper outer left 2 o'clock - INVASIVE DUCTAL CARCINOMA. - DUCTAL CARCINOMA IN SITU. - LYMPHOVASCULAR INVASION IS IDENTIFIED. - SEE COMMENT. 2. Lymph node, needle/core biopsy, left axilla - INVASIVE DUCTAL CARCINOMA. - SEE COMMENT.   10/11/2018 Receptors her2   The tumor cells are POSITIVE for Her2 (3+). Estrogen Receptor: 0%, NEGATIVE Progesterone Receptor: 0%, NEGATIVE Proliferation Marker Ki67: 70%   11/02/2018 Initial Diagnosis   Malignant neoplasm of upper-outer quadrant of left breast in female, estrogen receptor negative ([name])   11/15/2018 Breast MRI   MRI breast 11/15/18  IMPRESSION: 1. 3.3 centimeter mass in the LATERAL portion of the LEFT breast consistent with known malignancy. 2. There is significant non mass enhancement surrounding this mass and extending anteriorly into the nipple base, with largest diameter in the anterior to posterior axis, measuring 7.2 centimeters. 3. If the patient would consider breast conservation, additional MR guided core biopsies are recommended. Consider biopsy of the inferior and anterior extent of the non mass enhancement to document extent of disease. 4. Three enlarged LEFT axillary lymph nodes. 5. RIGHT breast is negative.    11/15/2018 PET scan   PET 11/15/18 IMPRESSION: Hypermetabolic left breast lesion compatible with known primary. Hypermetabolic left axillary lymph nodes are consistent with metastatic disease.   No evidence for additional hypermetabolic metastatic involvement in the neck, chest, abdomen, or pelvis.   11/17/2018 - 03/01/2019 Chemotherapy   Neo-adjuvant Kadcyla and perjeta q3weeks for 6 cycles starting 11/17/18. Stopped before surgery    11/29/2018 Pathology Results   Diagnosis 1. Breast, left, needle core biopsy, inferior anterior (cylinder clip) - INVASIVE DUCTAL CARCINOMA. - DUCTAL CARCINOMA IN SITU. - LYMPHOVASCULAR INVASION IS IDENTIFIED. - SEE COMMENT. 2. Breast, left, needle core biopsy, central posterior (barbell clip) - INVASIVE DUCTAL CARCINOMA. - LYMPHOVASCULAR INVASION IS IDENTIFIED.   02/12/2019 Breast MRI   IMPRESSION: 1. Complete resolution of previously identified enhancing mass and associated non mass enhancement within the left breast. This is consistent with excellent response to chemotherapy. No residual or suspicious findings are identified. 2. No MRI evidence of malignancy on the right. 3. Previously identified left  axillary lymphadenopathy not definitively seen on today's study. However, this may be due to decreased field-of-view compared to prior study.     03/14/2019 Cancer Staging   Staging form: Breast, AJCC 8th Edition - Pathologic stage from 03/14/2019: pT0, pN0, cM0, GX, ER: Unknown, PR: Unknown, HER2: Not Assessed - Signed by Malachy Mood, MD on 03/28/2019   03/14/2019 Surgery   LEFT MASTECTOMY WITH TARGETED LYMPH NODE DISSECTION and Left Axillary Sentinel Lymph  Node Biopsy by Dr Carolynne Edouard 03/14/19    03/14/2019 Pathology Results   FINAL MICROSCOPIC DIAGNOSIS:   A. LYMPH NODE, LEFT, SENTINEL, BIOPSY:  - There is no evidence of carcinoma in 1 of 1 lymph node (0/1).   B. BREAST, LEFT, MASTECTOMY:  - Benign breast parenchyma with treatment-related changes.   - There is no evidence of malignancy.  - See oncology table below.   C. LYMPH NODE, LEFT #1, SENTINEL, BIOPSY:  - There is no evidence of carcinoma in 1 of 1 lymph node (0/1).   D. LYMPH NODE, LEFT #2, SENTINEL, BIOPSY:  - There is no evidence of carcinoma in 1 of 1 lymph node (0/1).     04/04/2019 - 04/25/2019 Chemotherapy   Maintenance Herceptin injections every 3 weeks starting 04/03/19 to complete 1 year of treatment that was started in 11/2018. Stopped after 2nd dose as she will not be repeating Echos.    06/03/2021 - 06/24/2021 Chemotherapy   Patient is on Treatment Plan : BREAST Trastuzumab q21d X 11 Cycles     06/25/2021 - 01/01/2022 Chemotherapy   Patient is on Treatment Plan : BREAST Docetaxel + Trastuzumab + Pertuzumab (THP) q21d     06/25/2021 - 01/02/2022 Chemotherapy   Patient is on Treatment Plan : BREAST Docetaxel + Trastuzumab + Pertuzumab (THP) q21d     09/14/2021 Imaging   CT chest/abdomen/pelvis  IMPRESSION:  1. Interval resolution of previously seen bulky left axillary and  subpectoral lymphadenopathy. No persistently enlarged lymph nodes.  2. Multiple liver lesions are significantly diminished in size.  3. Widespread osseous metastatic disease, with an interval increase  in sclerosis of several previously lytic lesions.  4. Constellation of findings is consistent with treatment response  of metastatic disease  5. New, although age indeterminate pathologic wedge deformity of the  T6 vertebral body as well as an increased pathologic wedge deformity  of the L2 vertebral body.  6. Coronary artery disease.  Aortic Atherosclerosis (ICD10-I70.0).    01/26/2022 -  Chemotherapy   Patient is on Treatment Plan : BREAST METASTATIC Fam-Trastuzumab Deruxtecan-nxki (Enhertu) (5.4) q21d     Malignant neoplasm metastatic to liver  04/17/2021 Imaging   CT ABDOMEN AND PELVIS WITH CONTRAST: -New lytic lesion involving the L2 vertebral body (6:72, 2:26) with minimal cortical  breakthrough superiorly, but with preservation of vertebral body height, concerning for metastatic disease.  -There are several indeterminate hepatic lesions as described above. In addition to the lesions described above, there is an additional subtle 1.5 cm hypodensity in the hepatic dome (2:7). Given clinical history and presence of a new L2 lesion, leading differential consideration is metastatic disease. Recommend further evaluation with MRI of the abdomen with and without contrast.   05/12/2021 Imaging   MRI ABDOMEN WITH AND WITHOUT CONTRAST: Numerous heterogeneously enhancing, internally necrotic lesions in the right hepatic lobe, highly concerning for metastatic disease. Largest lesion measures up to 6.7 cm in hepatic segment VI.   Enhancing osseous lesions in the L2 vertebral body, both iliac bones, and in the sacrum, highly concerning  for osseous metastases.   05/15/2021 Initial Diagnosis   Liver metastases ([name])   05/27/2021 PET scan   1. Widespread recurrent/metastatic disease in this patient who is  status post bilateral mastectomy. Left chest wall recurrence with  left axillary/subpectoral, supraclavicular nodal metastasis.  2. Hepatic, left adrenal, and widespread osseous metastasis.  3. Incidental findings, including: Right nephrolithiasis. Tiny  hiatal hernia.    05/27/2021 Imaging   MRI LUMBAR AND THORACIC SPINE: Thoracic spine:  Osseous metastatic disease involving each level. The most dramatic deposits are at T4 and T6 where there is extraosseous/epidural tumor. No cord compression but there is foraminal impingement on the left at T6-7 and on the right at T3-4. Early dorsal epidural tumor at the level of T10 and T3.   Lumbar spine:  1. Widespread osseous metastatic disease with largest deposit  replacing the L2 body where there is a compression fracture with  mild height loss. Also at this level is early epidural tumor  extension likely affecting both L2-3 foramina.  2.  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    05/27/2021 Imaging   MRI HEAD WITH AND WITHOUT CONTRAST: Negative for metastatic disease to the brain or calvarium.   06/03/2021 - 06/24/2021 Chemotherapy   Patient is on Treatment Plan : BREAST Trastuzumab q21d X 11 Cycles     06/25/2021 - 01/01/2022 Chemotherapy   Patient is on Treatment Plan : BREAST Docetaxel + Trastuzumab + Pertuzumab (THP) q21d     06/25/2021 - 01/02/2022 Chemotherapy   Patient is on Treatment Plan : BREAST Docetaxel + Trastuzumab + Pertuzumab (THP) q21d     09/14/2021 Imaging   CT chest/abdomen/pelvis  IMPRESSION:  1. Interval resolution of previously seen bulky left axillary and  subpectoral lymphadenopathy. No persistently enlarged lymph nodes.  2. Multiple liver lesions are significantly diminished in size.  3. Widespread osseous metastatic disease, with an interval increase  in sclerosis of several previously lytic lesions.  4. Constellation of findings is consistent with treatment response  of metastatic disease  5. New, although age indeterminate pathologic wedge deformity of the  T6 vertebral body as well as an increased pathologic wedge deformity  of the L2 vertebral body.  6. Coronary artery disease.  Aortic Atherosclerosis (ICD10-I70.0).    01/26/2022 -  Chemotherapy   Patient is on Treatment Plan : BREAST METASTATIC Fam-Trastuzumab Deruxtecan-nxki (Enhertu) (5.4) q21d     Malignant neoplasm metastatic to bone  04/17/2021 Imaging   CT ABDOMEN AND PELVIS WITH CONTRAST: -New lytic lesion involving the L2 vertebral body (6:72, 2:26) with minimal cortical breakthrough superiorly, but with preservation of vertebral body height, concerning for metastatic disease.  -There are several indeterminate hepatic lesions as described above. In addition to the lesions described above, there is an additional subtle 1.5 cm hypodensity in the hepatic dome (2:7). Given clinical history and presence of  a new L2 lesion, leading differential consideration is metastatic disease. Recommend further evaluation with MRI of the abdomen with and without contrast.   05/12/2021 Imaging   MRI ABDOMEN WITH AND WITHOUT CONTRAST: Numerous heterogeneously enhancing, internally necrotic lesions in the right hepatic lobe, highly concerning for metastatic disease. Largest lesion measures up to 6.7 cm in hepatic segment VI.   Enhancing osseous lesions in the L2 vertebral body, both iliac bones, and in the sacrum, highly concerning for osseous metastases.   05/15/2021 Initial Diagnosis   Bone metastases ([name])   05/27/2021 PET scan   1. Widespread recurrent/metastatic disease in this patient who  is  status post bilateral mastectomy. Left chest wall recurrence with  left axillary/subpectoral, supraclavicular nodal metastasis.  2. Hepatic, left adrenal, and widespread osseous metastasis.  3. Incidental findings, including: Right nephrolithiasis. Tiny  hiatal hernia.    05/27/2021 Imaging   MRI LUMBAR AND THORACIC SPINE: Thoracic spine:  Osseous metastatic disease involving each level. The most dramatic deposits are at T4 and T6 where there is extraosseous/epidural tumor. No cord compression but there is foraminal impingement on the left at T6-7 and on the right at T3-4. Early dorsal epidural tumor at the level of T10 and T3.   Lumbar spine:  1. Widespread osseous metastatic disease with largest deposit  replacing the L2 body where there is a compression fracture with  mild height loss. Also at this level is early epidural tumor  extension likely affecting both L2-3 foramina.  2. 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    05/27/2021 Imaging   MRI HEAD WITH AND WITHOUT CONTRAST: Negative for metastatic disease to the brain or calvarium.   06/03/2021 - 06/24/2021 Chemotherapy   Patient is on Treatment Plan : BREAST Trastuzumab q21d X 11 Cycles     06/25/2021 - 01/01/2022 Chemotherapy    Patient is on Treatment Plan : BREAST Docetaxel + Trastuzumab + Pertuzumab (THP) q21d     06/25/2021 - 01/02/2022 Chemotherapy   Patient is on Treatment Plan : BREAST Docetaxel + Trastuzumab + Pertuzumab (THP) q21d     09/14/2021 Imaging   CT chest/abdomen/pelvis  IMPRESSION:  1. Interval resolution of previously seen bulky left axillary and  subpectoral lymphadenopathy. No persistently enlarged lymph nodes.  2. Multiple liver lesions are significantly diminished in size.  3. Widespread osseous metastatic disease, with an interval increase  in sclerosis of several previously lytic lesions.  4. Constellation of findings is consistent with treatment response  of metastatic disease  5. New, although age indeterminate pathologic wedge deformity of the  T6 vertebral body as well as an increased pathologic wedge deformity  of the L2 vertebral body.  6. Coronary artery disease.  Aortic Atherosclerosis (ICD10-I70.0).    01/26/2022 -  Chemotherapy   Patient is on Treatment Plan : BREAST METASTATIC Fam-Trastuzumab Deruxtecan-nxki (Enhertu) (5.4) q21d       REVIEW OF SYSTEMS:   Review of Systems  Constitutional: Negative.  Negative for appetite change, chills, diaphoresis, fatigue, fever and unexpected weight change.  HENT:  Negative.  Negative for hearing loss, lump/mass, mouth sores, nosebleeds, sore throat, tinnitus, trouble swallowing and voice change.   Eyes:  Positive for eye problems (tearing of the eye.). Negative for icterus.  Respiratory: Negative.  Negative for chest tightness, cough, hemoptysis, shortness of breath and wheezing.   Cardiovascular: Negative.  Negative for chest pain, leg swelling and palpitations.  Gastrointestinal:  Positive for constipation (occasionally). Negative for abdominal distention, abdominal pain, blood in stool, diarrhea, nausea, rectal pain and vomiting.  Endocrine: Negative.   Genitourinary: Negative.  Negative for bladder incontinence, difficulty  urinating, dyspareunia, dysuria, frequency, hematuria, menstrual problem, nocturia, pelvic pain, vaginal bleeding and vaginal discharge.   Musculoskeletal: Negative.  Negative for arthralgias, back pain, flank pain, gait problem, myalgias, neck pain and neck stiffness.  Skin: Negative.  Negative for itching, rash and wound.  Neurological:  Positive for numbness (severe paresthesias inability to use her left hand). Negative for dizziness, extremity weakness, gait problem, headaches, light-headedness, seizures and speech difficulty.  Hematological: Negative.  Negative for adenopathy. Does not bruise/bleed easily.  Psychiatric/Behavioral: Negative.  Negative for  confusion, decreased concentration, depression, sleep disturbance and suicidal ideas. The patient is not nervous/anxious.    VITALS:  Blood pressure (!) 168/66, pulse 71, temperature 98 F (36.7 C), temperature source Oral, resp. rate 18, height 5' 0.25" (1.53 m), weight 110 lb 4.8 oz (50 kg), SpO2 98 %.  Wt Readings from Last 3 Encounters:  08/19/22 107 lb 14.4 oz (48.9 kg)  08/03/22 109 lb 1.3 oz (49.5 kg)  07/29/22 110 lb 4.8 oz (50 kg)    Body mass index is 21.36 kg/m.  Performance status (ECOG): 1 - Symptomatic but completely ambulatory  PHYSICAL EXAM:  Physical Exam Vitals and nursing note reviewed. Exam conducted with a chaperone present.  Constitutional:      General: She is not in acute distress.    Appearance: Normal appearance. She is normal weight. She is not ill-appearing, toxic-appearing or diaphoretic.  HENT:     Head: Normocephalic and atraumatic.     Right Ear: Tympanic membrane, ear canal and external ear normal. There is no impacted cerumen.     Left Ear: Tympanic membrane, ear canal and external ear normal. There is no impacted cerumen.     Nose: Nose normal. No congestion or rhinorrhea.     Mouth/Throat:     Mouth: Mucous membranes are moist.     Pharynx: Oropharynx is clear. No oropharyngeal exudate or  posterior oropharyngeal erythema.  Eyes:     General: No scleral icterus.       Right eye: No discharge.        Left eye: No discharge.     Extraocular Movements: Extraocular movements intact.     Conjunctiva/sclera: Conjunctivae normal.     Pupils: Pupils are equal, round, and reactive to light.  Neck:     Vascular: No carotid bruit.  Cardiovascular:     Rate and Rhythm: Normal rate and regular rhythm.     Pulses: Normal pulses.     Heart sounds: Normal heart sounds. No murmur heard.    No friction rub. No gallop.  Pulmonary:     Effort: Pulmonary effort is normal. No respiratory distress.     Breath sounds: Normal breath sounds. No stridor. No wheezing, rhonchi or rales.  Chest:     Chest wall: No tenderness.  Breasts:    Right: Normal.     Left: Absent.     Comments: Left mastectomy is negative with a stable mild erythema  in the upper left medial chest  Abdominal:     General: Bowel sounds are normal. There is no distension.     Palpations: Abdomen is soft. There is no mass.     Tenderness: There is no abdominal tenderness. There is no right CVA tenderness, left CVA tenderness, guarding or rebound.     Hernia: No hernia is present.  Musculoskeletal:        General: No swelling, tenderness, deformity or signs of injury. Normal range of motion.     Cervical back: Normal range of motion and neck supple. No rigidity or tenderness.     Right lower leg: No edema.     Left lower leg: No edema.     Comments: Mild lymphedema of the left arm  Lymphadenopathy:     Cervical: No cervical adenopathy.  Skin:    General: Skin is warm and dry.     Coloration: Skin is not jaundiced or pale.     Findings: No bruising, erythema (mild), lesion or rash.  Neurological:     Mental  Status: She is alert and oriented to person, place, and time. Mental status is at baseline.     Cranial Nerves: No cranial nerve deficit.     Sensory: No sensory deficit.     Motor: Weakness (She has severe  paresthesias of the left upper extremity with inability to use her left hand) present.     Coordination: Coordination normal.     Gait: Gait normal.     Deep Tendon Reflexes: Reflexes normal.  Psychiatric:        Mood and Affect: Mood normal.        Behavior: Behavior normal.        Thought Content: Thought content normal.        Judgment: Judgment normal.    LABORATORY DATA:  I have reviewed the data as listed    Component Value Date/Time   NA 137 08/19/2022 0000   NA 137 08/19/2022 0000   NA 137 11/13/2012 1057   K 4.1 08/19/2022 0000   K 4.1 08/19/2022 0000   K 4.5 11/13/2012 1057   CL 106 08/19/2022 0000   CO2 24 (A) 08/19/2022 0000   CO2 26 11/13/2012 1057   GLUCOSE 103 (H) 05/06/2022 1002   GLUCOSE 90 11/13/2012 1057   BUN 16 08/19/2022 0000   BUN 18.3 11/13/2012 1057   CREATININE 0.5 08/19/2022 0000   CREATININE 0.53 05/06/2022 1002   CREATININE 0.8 11/13/2012 1057   CALCIUM 9.6 08/19/2022 0000   CALCIUM 9.7 11/13/2012 1057   PROT 6.2 (L) 05/06/2022 1002   PROT 7.4 11/13/2012 1057   ALBUMIN 3.7 08/19/2022 0000   ALBUMIN 3.9 11/13/2012 1057   AST 41 (A) 08/19/2022 0000   AST 28 05/06/2022 1002   AST 22 11/13/2012 1057   ALT 21 08/19/2022 0000   ALT 14 05/06/2022 1002   ALT 15 11/13/2012 1057   ALKPHOS 57 08/19/2022 0000   ALKPHOS 77 11/13/2012 1057   BILITOT 0.6 05/06/2022 1002   BILITOT 0.54 11/13/2012 1057   GFRNONAA >60 05/06/2022 1002   GFRAA >60 04/25/2019 1045   No results found for: "SPEP", "UPEP"  Lab Results  Component Value Date   WBC 5.5 08/19/2022   NEUTROABS 3.08 08/19/2022   HGB 14.2 08/19/2022   HCT 42 08/19/2022   MCV 100 (A) 04/15/2022   PLT 78 (A) 08/19/2022     Chemistry      Component Value Date/Time   NA 137 08/19/2022 0000   NA 137 08/19/2022 0000   NA 137 11/13/2012 1057   K 4.1 08/19/2022 0000   K 4.1 08/19/2022 0000   K 4.5 11/13/2012 1057   CL 106 08/19/2022 0000   CO2 24 (A) 08/19/2022 0000   CO2 26 11/13/2012  1057   BUN 16 08/19/2022 0000   BUN 18.3 11/13/2012 1057   CREATININE 0.5 08/19/2022 0000   CREATININE 0.53 05/06/2022 1002   CREATININE 0.8 11/13/2012 1057   GLU 86 08/19/2022 0000      Component Value Date/Time   CALCIUM 9.6 08/19/2022 0000   CALCIUM 9.7 11/13/2012 1057   ALKPHOS 57 08/19/2022 0000   ALKPHOS 77 11/13/2012 1057   AST 41 (A) 08/19/2022 0000   AST 28 05/06/2022 1002   AST 22 11/13/2012 1057   ALT 21 08/19/2022 0000   ALT 14 05/06/2022 1002   ALT 15 11/13/2012 1057   BILITOT 0.6 05/06/2022 1002   BILITOT 0.54 11/13/2012 1057      RADIOGRAPHIC STUDIES: No results found.  I,Gabriella Ballesteros,acting as a scribe for Jasmin Beckwith, MD.,have documented all relevant documentation on the behalf of Jasmin Beckwith, MD,as directed by  Jasmin Beckwith, MD while in the presence of Jasmin Beckwith, MD.

## 2022-08-02 ENCOUNTER — Encounter: Payer: Self-pay | Admitting: Oncology

## 2022-08-02 MED FILL — Dexamethasone Sodium Phosphate Inj 100 MG/10ML: INTRAMUSCULAR | Qty: 1 | Status: AC

## 2022-08-02 MED FILL — Zoledronic Acid Inj Conc For IV Infusion 4 MG/5ML: INTRAVENOUS | Qty: 3.75 | Status: AC

## 2022-08-03 ENCOUNTER — Inpatient Hospital Stay: Payer: Medicare Other

## 2022-08-03 ENCOUNTER — Encounter: Payer: Self-pay | Admitting: Oncology

## 2022-08-03 VITALS — BP 169/69 | HR 73 | Temp 97.7°F | Resp 20 | Ht 60.25 in | Wt 109.1 lb

## 2022-08-03 DIAGNOSIS — Z171 Estrogen receptor negative status [ER-]: Secondary | ICD-10-CM

## 2022-08-03 DIAGNOSIS — C787 Secondary malignant neoplasm of liver and intrahepatic bile duct: Secondary | ICD-10-CM

## 2022-08-03 DIAGNOSIS — Z5112 Encounter for antineoplastic immunotherapy: Secondary | ICD-10-CM | POA: Diagnosis not present

## 2022-08-03 DIAGNOSIS — C7951 Secondary malignant neoplasm of bone: Secondary | ICD-10-CM

## 2022-08-03 MED ORDER — HEPARIN SOD (PORK) LOCK FLUSH 100 UNIT/ML IV SOLN
500.0000 [IU] | Freq: Once | INTRAVENOUS | Status: AC | PRN
  Administered 2022-08-03: 500 [IU]

## 2022-08-03 MED ORDER — SODIUM CHLORIDE 0.9 % IV SOLN
10.0000 mg | Freq: Once | INTRAVENOUS | Status: AC
  Administered 2022-08-03: 10 mg via INTRAVENOUS
  Filled 2022-08-03: qty 10

## 2022-08-03 MED ORDER — DEXTROSE 5 % IV SOLN: Freq: Once | INTRAVENOUS | Status: AC

## 2022-08-03 MED ORDER — SODIUM CHLORIDE 0.9% FLUSH
10.0000 mL | INTRAVENOUS | Status: DC | PRN
  Administered 2022-08-03: 10 mL

## 2022-08-03 MED ORDER — PALONOSETRON HCL INJECTION 0.25 MG/5ML
0.2500 mg | Freq: Once | INTRAVENOUS | Status: AC
  Administered 2022-08-03: 0.25 mg via INTRAVENOUS
  Filled 2022-08-03: qty 5

## 2022-08-03 MED ORDER — DIPHENHYDRAMINE HCL 25 MG PO CAPS
50.0000 mg | ORAL_CAPSULE | Freq: Once | ORAL | Status: AC
  Administered 2022-08-03: 50 mg via ORAL
  Filled 2022-08-03: qty 2

## 2022-08-03 MED ORDER — FAM-TRASTUZUMAB DERUXTECAN-NXKI CHEMO 100 MG IV SOLR
4.0500 mg/kg | Freq: Once | INTRAVENOUS | Status: AC
  Administered 2022-08-03: 200 mg via INTRAVENOUS
  Filled 2022-08-03: qty 10

## 2022-08-03 MED ORDER — ACETAMINOPHEN 325 MG PO TABS
650.0000 mg | ORAL_TABLET | Freq: Once | ORAL | Status: AC
  Administered 2022-08-03: 650 mg via ORAL
  Filled 2022-08-03: qty 2

## 2022-08-03 MED ORDER — ZOLEDRONIC ACID 4 MG/5ML IV CONC
3.0000 mg | Freq: Once | INTRAVENOUS | Status: AC
  Administered 2022-08-03: 3 mg via INTRAVENOUS
  Filled 2022-08-03: qty 3.75

## 2022-08-03 NOTE — Patient Instructions (Signed)
Fam-Trastuzumab Deruxtecan Injection What is this medication? FAM-TRASTUZUMAB DERUXTECAN (fam-tras TOOZ eu mab DER ux TEE kan) treats some types of cancer. It works by blocking a protein that causes cancer cells to grow and multiply. This helps to slow or stop the spread of cancer cells. This medicine may be used for other purposes; ask your health care provider or pharmacist if you have questions. COMMON BRAND NAME(S): ENHERTU What should I tell my care team before I take this medication? They need to know if you have any of these conditions: Heart disease Heart failure Infection, especially a viral infection, such as chickenpox, cold sores, or herpes Liver disease Lung or breathing disease, such as asthma or COPD An unusual or allergic reaction to fam-trastuzumab deruxtecan, other medications, foods, dyes, or preservatives Pregnant or trying to get pregnant Breast-feeding How should I use this medication? This medication is injected into a vein. It is given by your care team in a hospital or clinic setting. A special MedGuide will be given to you before each treatment. Be sure to read this information carefully each time. Talk to your care team about the use of this medication in children. Special care may be needed. Overdosage: If you think you have taken too much of this medicine contact a poison control center or emergency room at once. NOTE: This medicine is only for you. Do not share this medicine with others. What if I miss a dose? It is important not to miss your dose. Call your care team if you are unable to keep an appointment. What may interact with this medication? Interactions are not expected. This list may not describe all possible interactions. Give your health care provider a list of all the medicines, herbs, non-prescription drugs, or dietary supplements you use. Also tell them if you smoke, drink alcohol, or use illegal drugs. Some items may interact with your  medicine. What should I watch for while using this medication? Visit your care team for regular checks on your progress. Tell your care team if your symptoms do not start to get better or if they get worse. Your condition will be monitored carefully while you are receiving this medication. Do not become pregnant while taking this medication or for 7 months after stopping it. Women should inform their care team if they wish to become pregnant or think they might be pregnant. Men should not father a child while taking this medication and for 4 months after stopping it. There is potential for serious side effects to an unborn child. Talk to your care team for more information. Do not breast-feed an infant while taking this medication or for 7 months after the last dose. This medication has caused decreased sperm counts in some men. This may make it more difficult to father a child. Talk to your care team if you are concerned about your fertility. This medication may increase your risk to bruise or bleed. Call your care team if you notice any unusual bleeding. Be careful brushing or flossing your teeth or using a toothpick because you may get an infection or bleed more easily. If you have any dental work done, tell your dentist you are receiving this medication. This medication may cause dry eyes and blurred vision. If you wear contact lenses, you may feel some discomfort. Lubricating eye drops may help. See your care team if the problem does not go away or is severe. This medication may increase your risk of getting an infection. Call your care team for   advice if you get a fever, chills, sore throat, or other symptoms of a cold or flu. Do not treat yourself. Try to avoid being around people who are sick. Avoid taking medications that contain aspirin, acetaminophen, ibuprofen, naproxen, or ketoprofen unless instructed by your care team. These medications may hide a fever. What side effects may I notice from  receiving this medication? Side effects that you should report to your care team as soon as possible: Allergic reactions--skin rash, itching, hives, swelling of the face, lips, tongue, or throat Dry cough, shortness of breath or trouble breathing Infection--fever, chills, cough, sore throat, wounds that don't heal, pain or trouble when passing urine, general feeling of discomfort or being unwell Heart failure--shortness of breath, swelling of the ankles, feet, or hands, sudden weight gain, unusual weakness or fatigue Unusual bruising or bleeding Side effects that usually do not require medical attention (report these to your care team if they continue or are bothersome): Constipation Diarrhea Hair loss Muscle pain Nausea Vomiting This list may not describe all possible side effects. Call your doctor for medical advice about side effects. You may report side effects to FDA at 1-800-FDA-1088. Where should I keep my medication? This medication is given in a hospital or clinic. It will not be stored at home. NOTE: This sheet is a summary. It may not cover all possible information. If you have questions about this medicine, talk to your doctor, pharmacist, or health care provider.  2023 Elsevier/Gold Standard (2021-01-07 00:00:00) 

## 2022-08-10 ENCOUNTER — Other Ambulatory Visit: Payer: Self-pay

## 2022-08-18 NOTE — Progress Notes (Signed)
Patient Care Team: [name], MD as PCP - General (Family Medicine) [name], MD as Consulting Physician (Oncology)  Clinic Day: 08/19/22  Referring physician: [name], MD  ASSESSMENT & PLAN:  Assessment & Plan: Malignant neoplasm of upper-outer quadrant of left breast in female, estrogen receptor negative ([name]) Stage IIB (T2c N1 M0) HER2 receptor positive left breast cancer, diagnosed in June 2020. This was ER/PR negative. This was treated with neoadjuvant Kadcyla/Perjeta and left mastectomy, with a complete response in breast and nodes. She had only received two doses of adjuvant Herceptin before being discontinued in December 2020.   History of colon cancer, stage II History of stage II colon cancer, diagnosed in May 2007. This was treated with surgical resection and adjuvant chemotherapy with Xeloda. Colonoscopy from 2013 revealed 2 polyps which were resected and chronic diverticulosis.   Malignant neoplasm metastatic to liver [facility]) Liver metastases discovered 04/17/21.  Numerous heterogeneously enhancing, internally necrotic lesions in the right hepatic lobe, consistent with metastatic disease. Largest lesion measures up to 6.7 cm and there are at least 6 lesions. We now have biopsy proven HER2 positive recurrent breast cancer with ER/PR negative. She received THP and tolerated it without significant difficulty.  CT scan showed excellent response with significant decrease in the size of her liver lesions. She later had progression of this disease, especially of the left chest wall, and was switched to Enhertu. Her CT of chest, abdomen, and pelvis on 07/27/2022 looked good with stable liver lesions which are subtle and at least 1 lesion is no longer visualized. She still has extensive sclerotic metastasis of the bone but these are stable with stable compressions of T6 and L2.    Malignant neoplasm metastatic to bone [facility]) Bone metastases discovered 04/17/21. Enhancing  osseous lesions are present in the L2 vertebral body, both iliac bones, and in the sacrum, highly concerning for osseous metastases. She received her 1st dose of zoledronic acid on January 16th.  I once again today explained to her and her son the reasoning for this medicine.  Her widespread bone metastases show an interval increase in sclerosis of previously lytic lesions, consistent with treatment response.   Chest wall recurrence of breast cancer, left ([name]) Left supraclavicular lymph node measuring 2-3 cm. Also involvement of subpectoral node and chest wall skin. This was rapidly progressive despite receiving  trastuzumab.  We have added in Perjeta and Taxotere to her current regimen and this has been very effective.  The CT scan also shows good response of her adenopathy with resolution of the previous bulky left axillary and subpectoral adenopathy. In September of 2023, she had a 2 cm nodule  located close to the left shoulder associated with nodularity of the left upper chest wall and appeared to have obvious progression of disease again. She was changed to [facility] and is responding well. The nodules have completely resolved.   Lymphedema She does have lymphedema of the left arm; however, this is better today, but with significant edema of the hand as well. We will refer her to a lymphedema specialist and she requests one in [place], so we will arrange that. She sleeps with the hand and arm elevated.  She has decreased use of her left hand but I think this is due to more than just the lymphedema.She will be seeing the neurologist to complete the nerve conduction velocity test on her hand in March.   Plan: She is scheduled for day 1 cycle 11 of Enhertu on  08/24/2022. She had extra blood drawn today for her Guardant 360 test. Her labs today are pending. I will see her back in 3 weeks with CBC and CMP for her next cycle. Her CT of chest, abdomen, and pelvis on 07/27/2022 looked good with stable liver  lesions which are subtle and at least 1 lesion is no longer visualized. She still has extensive sclerotic metastasis of the bone but these are stable with stable compression of T6 and L2. The patient and her son understand the plans discussed today and are in agreement with them.  She knows to contact our office if she develops concerns prior to her next appointment.  I provided 25 minutes of face-to-face time during this this encounter and > 50% was spent counseling as documented under my assessment and plan.    [name], MD  [facility] AT [facility], [place] [address] [place] [address] Dept: [phone] Dept Fax: [phone]   CHIEF COMPLAINT:  CC: An 89 year old female with metastatic breast cancer   Current Treatment: Enhertu  INTERVAL HISTORY:  Jasmin Lewis is here today for repeat clinical assessment for metastatic breast cancer. Patient states that she feels well and continues to have mild lymphedema of the left arm and occasional sharp pain in her left hand. She is scheduled for day 1 cycle 11 of Enhertu on 08/24/2022. She will have extra blood drawn today for her Guardant 360 test. Her labs today are pending. I will see her back in 3 weeks with CBC and CMP for her next cycle. She denies signs of infection such as sore throat, sinus drainage, cough, or urinary symptoms.  She denies fevers or recurrent chills. She denies pain. She denies nausea, vomiting, chest pain, dyspnea or cough. Her weight has decreased 2 pounds over last 2 weeks . She is accompanied by her son at today's visit.    I have reviewed the past medical history, past surgical history, social history and family history with the patient and they are unchanged from previous note.  ALLERGIES:  has No Known Allergies.  MEDICATIONS:  Current Outpatient Medications  Medication Sig Dispense Refill   Biotin 1 MG CAPS Take by mouth.     fish oil-omega-3 fatty acids  1000 MG capsule Take 1 g by mouth daily.     gabapentin (NEURONTIN) 100 MG capsule Take 1 capsule (100 mg total) by mouth at bedtime. (Patient not taking: Reported on 03/05/2022) 30 capsule 5   magnesium citrate SOLN Take 0.5 Bottles by mouth as needed for severe constipation.     Multiple Vitamin (MULTIVITAMIN) capsule Take 1 capsule by mouth daily.     ondansetron (ZOFRAN) 4 MG tablet Take 1 tablet (4 mg total) by mouth every 8 (eight) hours as needed for nausea or vomiting. (Patient not taking: Reported on 03/05/2022) 20 tablet 2   polyethylene glycol (MIRALAX / GLYCOLAX) 17 g packet Take 17 g by mouth as needed. constipation     No current facility-administered medications for this visit.   HISTORY OF PRESENT ILLNESS:   Oncology History Overview Note  Cancer Staging Malignant neoplasm of upper-outer quadrant of left breast in female, estrogen receptor negative ([name]) Staging form: Breast, AJCC 8th Edition - Clinical stage from 10/11/2018: Stage IIB (cT2, cN1, cM0, G3, ER-, PR-, HER2+) - Signed by Malachy Mood, MD on 11/02/2018 - Clinical: No stage assigned - Unsigned    History of colon cancer, stage II  09/2005 Initial Diagnosis  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 Findings were a 5.9 x 3.8 x 1.2 cm moderate to well-differentiated adenocarcinoma penetrating the muscular wall. Forty lymph nodes negative. No vascular or lymphatic invasion. Multiple diverticula with microabscess formation and inflammation. Carcinoma present in at least 1 diverticulum. Preop CEA 5.2 with lab normal range 0 to 2.5. Preop CT scan with no obvious additional pathology.    2007 -  Chemotherapy   She received oral Xeloda chemotherapy as an adjuvant. She declined treatment on a clinical trial.    11/12/2011 Initial Diagnosis   [name], stage II   12/09/2011 Procedure   Followup colonoscopy done on  12/09/2011. She was found to have 2 polyps which were removed. Chronic diverticulosis.     Malignant neoplasm of upper-outer quadrant of left breast in female, estrogen receptor negative ([name])  09/26/2018 Mammogram   Mammogram/US of left breast 09/26/18 IMPRESSION:  1. 2.9cm irregular mass in the upper outer left breast corresponds to the palpable abnormality. This is highly suspicious for breast carcinoma.  2. Two adjacent abnormal left axillary  Lymph nodes suspicious for metastatic adenopathy. There is a third borderline abnormal left axillary LN with a cortex thickened to 4mm.  3. Benign right breast cyst. No evidence of right breast malignancy.    10/11/2018 Cancer Staging   Staging form: Breast, AJCC 8th Edition - Clinical stage from 10/11/2018: Stage IIB (cT2, cN1, cM0, G3, ER-, PR-, HER2+) - Signed by Malachy Mood, MD on 11/02/2018   10/11/2018 Initial Biopsy   Diagnosis 10/11/18 1. Breast, left, needle core biopsy, upper outer left 2 o'clock - INVASIVE DUCTAL CARCINOMA. - DUCTAL CARCINOMA IN SITU. - LYMPHOVASCULAR INVASION IS IDENTIFIED. - SEE COMMENT. 2. Lymph node, needle/core biopsy, left axilla - INVASIVE DUCTAL CARCINOMA. - SEE COMMENT.   10/11/2018 Receptors her2   The tumor cells are POSITIVE for Her2 (3+). Estrogen Receptor: 0%, NEGATIVE Progesterone Receptor: 0%, NEGATIVE Proliferation Marker Ki67: 70%   11/02/2018 Initial Diagnosis   Malignant neoplasm of upper-outer quadrant of left breast in female, estrogen receptor negative ([name])   11/15/2018 Breast MRI   MRI breast 11/15/18  IMPRESSION: 1. 3.3 centimeter mass in the LATERAL portion of the LEFT breast consistent with known malignancy. 2. There is significant non mass enhancement surrounding this mass and extending anteriorly into the nipple base, with largest diameter in the anterior to posterior axis, measuring 7.2 centimeters. 3. If the patient would consider breast conservation, additional MR guided core biopsies are  recommended. Consider biopsy of the inferior and anterior extent of the non mass enhancement to document extent of disease. 4. Three enlarged LEFT axillary lymph nodes. 5. RIGHT breast is negative.   11/15/2018 PET scan   PET 11/15/18 IMPRESSION: Hypermetabolic left breast lesion compatible with known primary. Hypermetabolic left axillary lymph nodes are consistent with metastatic disease.   No evidence for additional hypermetabolic metastatic involvement in the neck, chest, abdomen, or pelvis.   11/17/2018 - 03/01/2019 Chemotherapy   Neo-adjuvant Kadcyla and perjeta q3weeks for 6 cycles starting 11/17/18. Stopped before surgery    11/29/2018 Pathology Results   Diagnosis 1. Breast, left, needle core biopsy, inferior anterior (cylinder clip) - INVASIVE DUCTAL CARCINOMA. - DUCTAL CARCINOMA IN SITU. - LYMPHOVASCULAR INVASION IS IDENTIFIED. - SEE COMMENT. 2. Breast, left, needle core biopsy, central posterior (barbell clip) - INVASIVE DUCTAL CARCINOMA. - LYMPHOVASCULAR INVASION IS IDENTIFIED.   02/12/2019 Breast MRI  IMPRESSION: 1. Complete resolution of previously identified enhancing mass and associated non mass enhancement within the left breast. This is consistent with excellent response to chemotherapy. No residual or suspicious findings are identified. 2. No MRI evidence of malignancy on the right. 3. Previously identified left axillary lymphadenopathy not definitively seen on today's study. However, this may be due to decreased field-of-view compared to prior study.     03/14/2019 Cancer Staging   Staging form: Breast, AJCC 8th Edition - Pathologic stage from 03/14/2019: pT0, pN0, cM0, GX, ER: Unknown, PR: Unknown, HER2: Not Assessed - Signed by Malachy Mood, MD on 03/28/2019   03/14/2019 Surgery   LEFT MASTECTOMY WITH TARGETED LYMPH NODE DISSECTION and Left Axillary Sentinel Lymph  Node Biopsy by Dr Carolynne Edouard 03/14/19    03/14/2019 Pathology Results   FINAL MICROSCOPIC DIAGNOSIS:    A. LYMPH NODE, LEFT, SENTINEL, BIOPSY:  - There is no evidence of carcinoma in 1 of 1 lymph node (0/1).   B. BREAST, LEFT, MASTECTOMY:  - Benign breast parenchyma with treatment-related changes.  - There is no evidence of malignancy.  - See oncology table below.   C. LYMPH NODE, LEFT #1, SENTINEL, BIOPSY:  - There is no evidence of carcinoma in 1 of 1 lymph node (0/1).   D. LYMPH NODE, LEFT #2, SENTINEL, BIOPSY:  - There is no evidence of carcinoma in 1 of 1 lymph node (0/1).     04/04/2019 - 04/25/2019 Chemotherapy   Maintenance Herceptin injections every 3 weeks starting 04/03/19 to complete 1 year of treatment that was started in 11/2018. Stopped after 2nd dose as she will not be repeating Echos.    06/03/2021 - 06/24/2021 Chemotherapy   Patient is on Treatment Plan : BREAST Trastuzumab q21d X 11 Cycles     06/25/2021 - 01/01/2022 Chemotherapy   Patient is on Treatment Plan : BREAST Docetaxel + Trastuzumab + Pertuzumab (THP) q21d     06/25/2021 - 01/02/2022 Chemotherapy   Patient is on Treatment Plan : BREAST Docetaxel + Trastuzumab + Pertuzumab (THP) q21d     09/14/2021 Imaging   CT chest/abdomen/pelvis  IMPRESSION:  1. Interval resolution of previously seen bulky left axillary and  subpectoral lymphadenopathy. No persistently enlarged lymph nodes.  2. Multiple liver lesions are significantly diminished in size.  3. Widespread osseous metastatic disease, with an interval increase  in sclerosis of several previously lytic lesions.  4. Constellation of findings is consistent with treatment response  of metastatic disease  5. New, although age indeterminate pathologic wedge deformity of the  T6 vertebral body as well as an increased pathologic wedge deformity  of the L2 vertebral body.  6. Coronary artery disease.  Aortic Atherosclerosis (ICD10-I70.0).    01/26/2022 -  Chemotherapy   Patient is on Treatment Plan : BREAST METASTATIC Fam-Trastuzumab Deruxtecan-nxki (Enhertu)  (5.4) q21d     Malignant neoplasm metastatic to liver ([name])  04/17/2021 Imaging   CT ABDOMEN AND PELVIS WITH CONTRAST: -New lytic lesion involving the L2 vertebral body (6:72, 2:26) with minimal cortical breakthrough superiorly, but with preservation of vertebral body height, concerning for metastatic disease.  -There are several indeterminate hepatic lesions as described above. In addition to the lesions described above, there is an additional subtle 1.5 cm hypodensity in the hepatic dome (2:7). Given clinical history and presence of a new L2 lesion, leading differential consideration is metastatic disease. Recommend further evaluation with MRI of the abdomen with and without contrast.   05/12/2021 Imaging   MRI ABDOMEN WITH AND WITHOUT  CONTRAST: Numerous heterogeneously enhancing, internally necrotic lesions in the right hepatic lobe, highly concerning for metastatic disease. Largest lesion measures up to 6.7 cm in hepatic segment VI.   Enhancing osseous lesions in the L2 vertebral body, both iliac bones, and in the sacrum, highly concerning for osseous metastases.   05/15/2021 Initial Diagnosis   Liver metastases ([name])   05/27/2021 PET scan   1. Widespread recurrent/metastatic disease in this patient who is  status post bilateral mastectomy. Left chest wall recurrence with  left axillary/subpectoral, supraclavicular nodal metastasis.  2. Hepatic, left adrenal, and widespread osseous metastasis.  3. Incidental findings, including: Right nephrolithiasis. Tiny  hiatal hernia.    05/27/2021 Imaging   MRI LUMBAR AND THORACIC SPINE: Thoracic spine:  Osseous metastatic disease involving each level. The most dramatic deposits are at T4 and T6 where there is extraosseous/epidural tumor. No cord compression but there is foraminal impingement on the left at T6-7 and on the right at T3-4. Early dorsal epidural tumor at the level of T10 and T3.   Lumbar spine:  1. Widespread osseous metastatic disease  with largest deposit  replacing the L2 body where there is a compression fracture with  mild height loss. Also at this level is early epidural tumor  extension likely affecting both L2-3 foramina.  2. 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    05/27/2021 Imaging   MRI HEAD WITH AND WITHOUT CONTRAST: Negative for metastatic disease to the brain or calvarium.   06/03/2021 - 06/24/2021 Chemotherapy   Patient is on Treatment Plan : BREAST Trastuzumab q21d X 11 Cycles     06/25/2021 - 01/01/2022 Chemotherapy   Patient is on Treatment Plan : BREAST Docetaxel + Trastuzumab + Pertuzumab (THP) q21d     06/25/2021 - 01/02/2022 Chemotherapy   Patient is on Treatment Plan : BREAST Docetaxel + Trastuzumab + Pertuzumab (THP) q21d     09/14/2021 Imaging   CT chest/abdomen/pelvis  IMPRESSION:  1. Interval resolution of previously seen bulky left axillary and  subpectoral lymphadenopathy. No persistently enlarged lymph nodes.  2. Multiple liver lesions are significantly diminished in size.  3. Widespread osseous metastatic disease, with an interval increase  in sclerosis of several previously lytic lesions.  4. Constellation of findings is consistent with treatment response  of metastatic disease  5. New, although age indeterminate pathologic wedge deformity of the  T6 vertebral body as well as an increased pathologic wedge deformity  of the L2 vertebral body.  6. Coronary artery disease.  Aortic Atherosclerosis (ICD10-I70.0).    01/26/2022 -  Chemotherapy   Patient is on Treatment Plan : BREAST METASTATIC Fam-Trastuzumab Deruxtecan-nxki (Enhertu) (5.4) q21d     Malignant neoplasm metastatic to bone ([name])  04/17/2021 Imaging   CT ABDOMEN AND PELVIS WITH CONTRAST: -New lytic lesion involving the L2 vertebral body (6:72, 2:26) with minimal cortical breakthrough superiorly, but with preservation of vertebral body height, concerning for metastatic disease.  -There are  several indeterminate hepatic lesions as described above. In addition to the lesions described above, there is an additional subtle 1.5 cm hypodensity in the hepatic dome (2:7). Given clinical history and presence of a new L2 lesion, leading differential consideration is metastatic disease. Recommend further evaluation with MRI of the abdomen with and without contrast.   05/12/2021 Imaging   MRI ABDOMEN WITH AND WITHOUT CONTRAST: Numerous heterogeneously enhancing, internally necrotic lesions in the right hepatic lobe, highly concerning for metastatic disease. Largest lesion measures up to 6.7 cm in hepatic segment  VI.   Enhancing osseous lesions in the L2 vertebral body, both iliac bones, and in the sacrum, highly concerning for osseous metastases.   05/15/2021 Initial Diagnosis   Bone metastases ([name])   05/27/2021 PET scan   1. Widespread recurrent/metastatic disease in this patient who is  status post bilateral mastectomy. Left chest wall recurrence with  left axillary/subpectoral, supraclavicular nodal metastasis.  2. Hepatic, left adrenal, and widespread osseous metastasis.  3. Incidental findings, including: Right nephrolithiasis. Tiny  hiatal hernia.    05/27/2021 Imaging   MRI LUMBAR AND THORACIC SPINE: Thoracic spine:  Osseous metastatic disease involving each level. The most dramatic deposits are at T4 and T6 where there is extraosseous/epidural tumor. No cord compression but there is foraminal impingement on the left at T6-7 and on the right at T3-4. Early dorsal epidural tumor at the level of T10 and T3.   Lumbar spine:  1. Widespread osseous metastatic disease with largest deposit  replacing the L2 body where there is a compression fracture with  mild height loss. Also at this level is early epidural tumor  extension likely affecting both L2-3 foramina.  2. 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    05/27/2021 Imaging   MRI HEAD WITH AND WITHOUT  CONTRAST: Negative for metastatic disease to the brain or calvarium.   06/03/2021 - 06/24/2021 Chemotherapy   Patient is on Treatment Plan : BREAST Trastuzumab q21d X 11 Cycles     06/25/2021 - 01/01/2022 Chemotherapy   Patient is on Treatment Plan : BREAST Docetaxel + Trastuzumab + Pertuzumab (THP) q21d     06/25/2021 - 01/02/2022 Chemotherapy   Patient is on Treatment Plan : BREAST Docetaxel + Trastuzumab + Pertuzumab (THP) q21d     09/14/2021 Imaging   CT chest/abdomen/pelvis  IMPRESSION:  1. Interval resolution of previously seen bulky left axillary and  subpectoral lymphadenopathy. No persistently enlarged lymph nodes.  2. Multiple liver lesions are significantly diminished in size.  3. Widespread osseous metastatic disease, with an interval increase  in sclerosis of several previously lytic lesions.  4. Constellation of findings is consistent with treatment response  of metastatic disease  5. New, although age indeterminate pathologic wedge deformity of the  T6 vertebral body as well as an increased pathologic wedge deformity  of the L2 vertebral body.  6. Coronary artery disease.  Aortic Atherosclerosis (ICD10-I70.0).    01/26/2022 -  Chemotherapy   Patient is on Treatment Plan : BREAST METASTATIC Fam-Trastuzumab Deruxtecan-nxki (Enhertu) (5.4) q21d       REVIEW OF SYSTEMS:   Review of Systems  Constitutional: Negative.  Negative for appetite change, chills, diaphoresis, fatigue, fever and unexpected weight change.  HENT:  Negative.  Negative for hearing loss, lump/mass, mouth sores, nosebleeds, sore throat, tinnitus, trouble swallowing and voice change.   Eyes:  Positive for eye problems (tearing of the eye.). Negative for icterus.  Respiratory: Negative.  Negative for chest tightness, cough, hemoptysis, shortness of breath and wheezing.   Cardiovascular: Negative.  Negative for chest pain, leg swelling and palpitations.  Gastrointestinal:  Positive for constipation  (occasionally). Negative for abdominal distention, abdominal pain, blood in stool, diarrhea, nausea, rectal pain and vomiting.  Endocrine: Negative.   Genitourinary: Negative.  Negative for bladder incontinence, difficulty urinating, dyspareunia, dysuria, frequency, hematuria, menstrual problem, nocturia, pelvic pain, vaginal bleeding and vaginal discharge.   Musculoskeletal: Negative.  Negative for arthralgias, back pain, flank pain, gait problem, myalgias, neck pain and neck stiffness.  Skin: Negative.  Negative for  itching, rash and wound.  Neurological:  Positive for numbness (severe paresthesias inability to use her left hand). Negative for dizziness, extremity weakness, gait problem, headaches, light-headedness, seizures and speech difficulty.  Hematological: Negative.  Negative for adenopathy. Does not bruise/bleed easily.  Psychiatric/Behavioral: Negative.  Negative for confusion, decreased concentration, depression, sleep disturbance and suicidal ideas. The patient is not nervous/anxious.    VITALS:  Blood pressure (!) 178/64, pulse 70, temperature 97.9 F (36.6 C), temperature source Oral, resp. rate 16, height 5' 0.25" (1.53 m), weight 107 lb 14.4 oz (48.9 kg), SpO2 97 %.  Wt Readings from Last 3 Encounters:  08/19/22 107 lb 14.4 oz (48.9 kg)  08/03/22 109 lb 1.3 oz (49.5 kg)  07/29/22 110 lb 4.8 oz (50 kg)    Body mass index is 20.9 kg/m.  Performance status (ECOG): 1 - Symptomatic but completely ambulatory  PHYSICAL EXAM:  Physical Exam Vitals and nursing note reviewed. Exam conducted with a chaperone present.  Constitutional:      General: She is not in acute distress.    Appearance: Normal appearance. She is normal weight. She is not ill-appearing, toxic-appearing or diaphoretic.  HENT:     Head: Normocephalic and atraumatic.     Right Ear: Tympanic membrane, ear canal and external ear normal. There is no impacted cerumen.     Left Ear: Tympanic membrane, ear canal and  external ear normal. There is no impacted cerumen.     Nose: Nose normal. No congestion or rhinorrhea.     Mouth/Throat:     Mouth: Mucous membranes are moist.     Pharynx: Oropharynx is clear. No oropharyngeal exudate or posterior oropharyngeal erythema.  Eyes:     General: No scleral icterus.       Right eye: No discharge.        Left eye: No discharge.     Extraocular Movements: Extraocular movements intact.     Conjunctiva/sclera: Conjunctivae normal.     Pupils: Pupils are equal, round, and reactive to light.  Neck:     Vascular: No carotid bruit.  Cardiovascular:     Rate and Rhythm: Normal rate and regular rhythm.     Pulses: Normal pulses.     Heart sounds: Normal heart sounds. No murmur heard.    No friction rub. No gallop.  Pulmonary:     Effort: Pulmonary effort is normal. No respiratory distress.     Breath sounds: Normal breath sounds. No stridor. No wheezing, rhonchi or rales.  Chest:     Chest wall: No tenderness.  Breasts:    Right: Normal.     Left: Absent.     Comments: Left mastectomy is negative.  Right breast is normal.  Skin is clear. Port in her right upper chest is fine. Abdominal:     General: Bowel sounds are normal. There is no distension.     Palpations: Abdomen is soft. There is no mass.     Tenderness: There is no abdominal tenderness. There is no right CVA tenderness, left CVA tenderness, guarding or rebound.     Hernia: No hernia is present.  Musculoskeletal:        General: No swelling, tenderness, deformity or signs of injury. Normal range of motion.     Cervical back: Normal range of motion and neck supple. No rigidity or tenderness.     Right lower leg: No edema.     Left lower leg: No edema.     Comments: Mild lymphedema of the left  arm  Lymphadenopathy:     Cervical: No cervical adenopathy.  Skin:    General: Skin is warm and dry.     Coloration: Skin is not jaundiced or pale.     Findings: No bruising, erythema, lesion or rash.   Neurological:     Mental Status: She is alert and oriented to person, place, and time. Mental status is at baseline.     Cranial Nerves: No cranial nerve deficit.     Sensory: No sensory deficit.     Motor: Weakness (She has severe paresthesias of the left upper extremity with inability to use her left hand) present.     Coordination: Coordination normal.     Gait: Gait normal.     Deep Tendon Reflexes: Reflexes normal.  Psychiatric:        Mood and Affect: Mood normal.        Behavior: Behavior normal.        Thought Content: Thought content normal.        Judgment: Judgment normal.    LABORATORY DATA:  I have reviewed the data as listed    Component Value Date/Time   NA 137 08/19/2022 0000   NA 137 08/19/2022 0000   NA 137 11/13/2012 1057   K 4.1 08/19/2022 0000   K 4.1 08/19/2022 0000   K 4.5 11/13/2012 1057   CL 106 08/19/2022 0000   CO2 24 (A) 08/19/2022 0000   CO2 26 11/13/2012 1057   GLUCOSE 103 (H) 05/06/2022 1002   GLUCOSE 90 11/13/2012 1057   BUN 16 08/19/2022 0000   BUN 18.3 11/13/2012 1057   CREATININE 0.5 08/19/2022 0000   CREATININE 0.53 05/06/2022 1002   CREATININE 0.8 11/13/2012 1057   CALCIUM 9.6 08/19/2022 0000   CALCIUM 9.7 11/13/2012 1057   PROT 6.2 (L) 05/06/2022 1002   PROT 7.4 11/13/2012 1057   ALBUMIN 3.7 08/19/2022 0000   ALBUMIN 3.9 11/13/2012 1057   AST 41 (A) 08/19/2022 0000   AST 28 05/06/2022 1002   AST 22 11/13/2012 1057   ALT 21 08/19/2022 0000   ALT 14 05/06/2022 1002   ALT 15 11/13/2012 1057   ALKPHOS 57 08/19/2022 0000   ALKPHOS 77 11/13/2012 1057   BILITOT 0.6 05/06/2022 1002   BILITOT 0.54 11/13/2012 1057   GFRNONAA >60 05/06/2022 1002   GFRAA >60 04/25/2019 1045   No results found for: "SPEP", "UPEP"  Lab Results  Component Value Date   WBC 3.9 08/24/2022   NEUTROABS 1.83 08/24/2022   HGB 12.3 08/24/2022   HCT 36 08/24/2022   MCV 100 (A) 04/15/2022   PLT 165 08/24/2022     Chemistry      Component Value  Date/Time   NA 137 08/19/2022 0000   NA 137 08/19/2022 0000   NA 137 11/13/2012 1057   K 4.1 08/19/2022 0000   K 4.1 08/19/2022 0000   K 4.5 11/13/2012 1057   CL 106 08/19/2022 0000   CO2 24 (A) 08/19/2022 0000   CO2 26 11/13/2012 1057   BUN 16 08/19/2022 0000   BUN 18.3 11/13/2012 1057   CREATININE 0.5 08/19/2022 0000   CREATININE 0.53 05/06/2022 1002   CREATININE 0.8 11/13/2012 1057   GLU 86 08/19/2022 0000      Component Value Date/Time   CALCIUM 9.6 08/19/2022 0000   CALCIUM 9.7 11/13/2012 1057   ALKPHOS 57 08/19/2022 0000   ALKPHOS 77 11/13/2012 1057   AST 41 (A) 08/19/2022 0000   AST 28 05/06/2022  1002   AST 22 11/13/2012 1057   ALT 21 08/19/2022 0000   ALT 14 05/06/2022 1002   ALT 15 11/13/2012 1057   BILITOT 0.6 05/06/2022 1002   BILITOT 0.54 11/13/2012 1057      RADIOGRAPHIC STUDIES: No results found.          I,Jasmine M Lassiter,acting as a scribe for [name], MD.,have documented all relevant documentation on the behalf of [name], MD,as directed by  [name], MD while in the presence of [name], MD.

## 2022-08-19 ENCOUNTER — Inpatient Hospital Stay: Payer: Medicare Other | Attending: Oncology | Admitting: Oncology

## 2022-08-19 ENCOUNTER — Inpatient Hospital Stay: Payer: Medicare Other

## 2022-08-19 VITALS — BP 178/64 | HR 70 | Temp 97.9°F | Resp 16 | Ht 60.25 in | Wt 107.9 lb

## 2022-08-19 DIAGNOSIS — C7951 Secondary malignant neoplasm of bone: Secondary | ICD-10-CM | POA: Diagnosis not present

## 2022-08-19 DIAGNOSIS — K5900 Constipation, unspecified: Secondary | ICD-10-CM | POA: Diagnosis not present

## 2022-08-19 DIAGNOSIS — Z5112 Encounter for antineoplastic immunotherapy: Secondary | ICD-10-CM | POA: Insufficient documentation

## 2022-08-19 DIAGNOSIS — C7952 Secondary malignant neoplasm of bone marrow: Secondary | ICD-10-CM

## 2022-08-19 DIAGNOSIS — Z171 Estrogen receptor negative status [ER-]: Secondary | ICD-10-CM

## 2022-08-19 DIAGNOSIS — C778 Secondary and unspecified malignant neoplasm of lymph nodes of multiple regions: Secondary | ICD-10-CM | POA: Insufficient documentation

## 2022-08-19 DIAGNOSIS — C787 Secondary malignant neoplasm of liver and intrahepatic bile duct: Secondary | ICD-10-CM

## 2022-08-19 DIAGNOSIS — Z9012 Acquired absence of left breast and nipple: Secondary | ICD-10-CM | POA: Diagnosis not present

## 2022-08-19 DIAGNOSIS — Z85038 Personal history of other malignant neoplasm of large intestine: Secondary | ICD-10-CM | POA: Insufficient documentation

## 2022-08-19 DIAGNOSIS — C50412 Malignant neoplasm of upper-outer quadrant of left female breast: Secondary | ICD-10-CM | POA: Insufficient documentation

## 2022-08-19 DIAGNOSIS — I890 Lymphedema, not elsewhere classified: Secondary | ICD-10-CM | POA: Diagnosis not present

## 2022-08-19 LAB — CBC AND DIFFERENTIAL
HCT: 42 (ref 36–46)
Hemoglobin: 14.2 (ref 12.0–16.0)
Neutrophils Absolute: 3.08
Platelets: 78 10*3/uL — AB (ref 150–400)
WBC: 5.5

## 2022-08-19 LAB — BASIC METABOLIC PANEL
BUN: 16 (ref 4–21)
CO2: 24 — AB (ref 13–22)
Chloride: 106 (ref 99–108)
Creatinine: 0.5 (ref 0.5–1.1)
EGFR: 60
Glucose: 86
Potassium: 4.1 mEq/L (ref 3.5–5.1)
Potassium: 4.1 mEq/L (ref 3.5–5.1)
Sodium: 137 (ref 137–147)
Sodium: 137 (ref 137–147)

## 2022-08-19 LAB — HEPATIC FUNCTION PANEL
ALT: 21 U/L (ref 7–35)
AST: 41 — AB (ref 13–35)
Alkaline Phosphatase: 57 (ref 25–125)
Bilirubin, Total: 0.5

## 2022-08-19 LAB — COMPREHENSIVE METABOLIC PANEL
Albumin: 3.7 (ref 3.5–5.0)
Calcium: 9.6 (ref 8.7–10.7)

## 2022-08-19 LAB — CBC: RBC: 4.01 (ref 3.87–5.11)

## 2022-08-19 MED ORDER — SODIUM CHLORIDE 0.9% FLUSH
10.0000 mL | Freq: Once | INTRAVENOUS | Status: AC | PRN
  Administered 2022-08-19: 10 mL

## 2022-08-19 MED ORDER — HEPARIN SOD (PORK) LOCK FLUSH 100 UNIT/ML IV SOLN
500.0000 [IU] | Freq: Once | INTRAVENOUS | Status: AC | PRN
  Administered 2022-08-19: 500 [IU]

## 2022-08-22 ENCOUNTER — Encounter: Payer: Self-pay | Admitting: Oncology

## 2022-08-23 ENCOUNTER — Other Ambulatory Visit: Payer: Self-pay | Admitting: Pharmacist

## 2022-08-23 DIAGNOSIS — T451X5S Adverse effect of antineoplastic and immunosuppressive drugs, sequela: Secondary | ICD-10-CM

## 2022-08-23 DIAGNOSIS — Z171 Estrogen receptor negative status [ER-]: Secondary | ICD-10-CM

## 2022-08-23 MED FILL — Dexamethasone Sodium Phosphate Inj 100 MG/10ML: INTRAMUSCULAR | Qty: 1 | Status: AC

## 2022-08-24 ENCOUNTER — Inpatient Hospital Stay: Payer: Medicare Other

## 2022-08-24 ENCOUNTER — Other Ambulatory Visit: Payer: Medicare Other

## 2022-08-24 VITALS — BP 162/78 | HR 77 | Temp 98.0°F | Resp 16

## 2022-08-24 DIAGNOSIS — Z5112 Encounter for antineoplastic immunotherapy: Secondary | ICD-10-CM | POA: Diagnosis not present

## 2022-08-24 DIAGNOSIS — C787 Secondary malignant neoplasm of liver and intrahepatic bile duct: Secondary | ICD-10-CM

## 2022-08-24 DIAGNOSIS — C7951 Secondary malignant neoplasm of bone: Secondary | ICD-10-CM

## 2022-08-24 DIAGNOSIS — Z171 Estrogen receptor negative status [ER-]: Secondary | ICD-10-CM

## 2022-08-24 LAB — CBC AND DIFFERENTIAL
HCT: 36 (ref 36–46)
Hemoglobin: 12.3 (ref 12.0–16.0)
Neutrophils Absolute: 1.83
Platelets: 165 10*3/uL (ref 150–400)
WBC: 3.9

## 2022-08-24 LAB — CBC: RBC: 3.51 — AB (ref 3.87–5.11)

## 2022-08-24 MED ORDER — DEXTROSE 5 % IV SOLN: Freq: Once | INTRAVENOUS | Status: AC

## 2022-08-24 MED ORDER — SODIUM CHLORIDE 0.9% FLUSH
10.0000 mL | INTRAVENOUS | Status: DC | PRN
  Administered 2022-08-24: 10 mL

## 2022-08-24 MED ORDER — HEPARIN SOD (PORK) LOCK FLUSH 100 UNIT/ML IV SOLN
500.0000 [IU] | Freq: Once | INTRAVENOUS | Status: AC | PRN
  Administered 2022-08-24: 500 [IU]

## 2022-08-24 MED ORDER — FAM-TRASTUZUMAB DERUXTECAN-NXKI CHEMO 100 MG IV SOLR
4.0500 mg/kg | Freq: Once | INTRAVENOUS | Status: AC
  Administered 2022-08-24: 200 mg via INTRAVENOUS
  Filled 2022-08-24: qty 10

## 2022-08-24 MED ORDER — ACETAMINOPHEN 325 MG PO TABS
650.0000 mg | ORAL_TABLET | Freq: Once | ORAL | Status: AC
  Administered 2022-08-24: 650 mg via ORAL
  Filled 2022-08-24: qty 2

## 2022-08-24 MED ORDER — SODIUM CHLORIDE 0.9 % IV SOLN
10.0000 mg | Freq: Once | INTRAVENOUS | Status: AC
  Administered 2022-08-24: 10 mg via INTRAVENOUS
  Filled 2022-08-24: qty 10

## 2022-08-24 MED ORDER — PALONOSETRON HCL INJECTION 0.25 MG/5ML
0.2500 mg | Freq: Once | INTRAVENOUS | Status: AC
  Administered 2022-08-24: 0.25 mg via INTRAVENOUS
  Filled 2022-08-24: qty 5

## 2022-08-24 MED ORDER — DIPHENHYDRAMINE HCL 25 MG PO CAPS
50.0000 mg | ORAL_CAPSULE | Freq: Once | ORAL | Status: AC
  Administered 2022-08-24: 50 mg via ORAL
  Filled 2022-08-24: qty 2

## 2022-08-24 NOTE — Patient Instructions (Signed)
Fam-Trastuzumab Deruxtecan Injection What is this medication? FAM-TRASTUZUMAB DERUXTECAN (fam-tras TOOZ eu mab DER ux TEE kan) treats some types of cancer. It works by blocking a protein that causes cancer cells to grow and multiply. This helps to slow or stop the spread of cancer cells. This medicine may be used for other purposes; ask your health care provider or pharmacist if you have questions. COMMON BRAND NAME(S): ENHERTU What should I tell my care team before I take this medication? They need to know if you have any of these conditions: Heart disease Heart failure Infection, especially a viral infection, such as chickenpox, cold sores, or herpes Liver disease Lung or breathing disease, such as asthma or COPD An unusual or allergic reaction to fam-trastuzumab deruxtecan, other medications, foods, dyes, or preservatives Pregnant or trying to get pregnant Breast-feeding How should I use this medication? This medication is injected into a vein. It is given by your care team in a hospital or clinic setting. A special MedGuide will be given to you before each treatment. Be sure to read this information carefully each time. Talk to your care team about the use of this medication in children. Special care may be needed. Overdosage: If you think you have taken too much of this medicine contact a poison control center or emergency room at once. NOTE: This medicine is only for you. Do not share this medicine with others. What if I miss a dose? It is important not to miss your dose. Call your care team if you are unable to keep an appointment. What may interact with this medication? Interactions are not expected. This list may not describe all possible interactions. Give your health care provider a list of all the medicines, herbs, non-prescription drugs, or dietary supplements you use. Also tell them if you smoke, drink alcohol, or use illegal drugs. Some items may interact with your  medicine. What should I watch for while using this medication? Visit your care team for regular checks on your progress. Tell your care team if your symptoms do not start to get better or if they get worse. Your condition will be monitored carefully while you are receiving this medication. Do not become pregnant while taking this medication or for 7 months after stopping it. Women should inform their care team if they wish to become pregnant or think they might be pregnant. Men should not father a child while taking this medication and for 4 months after stopping it. There is potential for serious side effects to an unborn child. Talk to your care team for more information. Do not breast-feed an infant while taking this medication or for 7 months after the last dose. This medication has caused decreased sperm counts in some men. This may make it more difficult to father a child. Talk to your care team if you are concerned about your fertility. This medication may increase your risk to bruise or bleed. Call your care team if you notice any unusual bleeding. Be careful brushing or flossing your teeth or using a toothpick because you may get an infection or bleed more easily. If you have any dental work done, tell your dentist you are receiving this medication. This medication may cause dry eyes and blurred vision. If you wear contact lenses, you may feel some discomfort. Lubricating eye drops may help. See your care team if the problem does not go away or is severe. This medication may increase your risk of getting an infection. Call your care team for   advice if you get a fever, chills, sore throat, or other symptoms of a cold or flu. Do not treat yourself. Try to avoid being around people who are sick. Avoid taking medications that contain aspirin, acetaminophen, ibuprofen, naproxen, or ketoprofen unless instructed by your care team. These medications may hide a fever. What side effects may I notice from  receiving this medication? Side effects that you should report to your care team as soon as possible: Allergic reactions--skin rash, itching, hives, swelling of the face, lips, tongue, or throat Dry cough, shortness of breath or trouble breathing Infection--fever, chills, cough, sore throat, wounds that don't heal, pain or trouble when passing urine, general feeling of discomfort or being unwell Heart failure--shortness of breath, swelling of the ankles, feet, or hands, sudden weight gain, unusual weakness or fatigue Unusual bruising or bleeding Side effects that usually do not require medical attention (report these to your care team if they continue or are bothersome): Constipation Diarrhea Hair loss Muscle pain Nausea Vomiting This list may not describe all possible side effects. Call your doctor for medical advice about side effects. You may report side effects to FDA at 1-800-FDA-1088. Where should I keep my medication? This medication is given in a hospital or clinic. It will not be stored at home. NOTE: This sheet is a summary. It may not cover all possible information. If you have questions about this medicine, talk to your doctor, pharmacist, or health care provider.  2023 Elsevier/Gold Standard (2021-01-07 00:00:00) 

## 2022-08-31 ENCOUNTER — Encounter: Payer: Self-pay | Admitting: Oncology

## 2022-09-07 ENCOUNTER — Encounter: Payer: Self-pay | Admitting: Oncology

## 2022-09-07 NOTE — Progress Notes (Signed)
Patient Care Team: [name], MD as PCP - General (Family Medicine) [name], MD as Consulting Physician (Oncology)  Clinic Day:09/09/22  Referring physician: [name], MD  ASSESSMENT & PLAN:  Assessment & Plan: Malignant neoplasm of upper-outer quadrant of left breast in female, estrogen receptor negative ([name]) Stage IIB (T2c N1 M0) HER2 receptor positive left breast cancer, diagnosed in June 2020. This was ER/PR negative. This was treated with neoadjuvant Kadcyla/Perjeta and left mastectomy, with a complete response in breast and nodes. She had only received two doses of adjuvant Herceptin before being discontinued in December 2020.   History of colon cancer, stage II History of stage II colon cancer, diagnosed in May 2007. This was treated with surgical resection and adjuvant chemotherapy with Xeloda. Colonoscopy from 2013 revealed 2 polyps which were resected and chronic diverticulosis.   Malignant neoplasm metastatic to liver [facility]) Liver metastases discovered 04/17/21.  Numerous heterogeneously enhancing, internally necrotic lesions in the right hepatic lobe, consistent with metastatic disease. Largest lesion measures up to 6.7 cm and there are at least 6 lesions. We now have biopsy proven HER2 positive recurrent breast cancer with ER/PR negative. She received THP and tolerated it without significant difficulty.  CT scan showed excellent response with significant decrease in the size of her liver lesions. She later had progression of this disease and was switched to Enhertu. Her CT of chest, abdomen, and pelvis on 07/27/2022 looked good with stable liver lesions which are subtle and at least 1 lesion is no longer visualized. She still has extensive sclerotic metastasis of the bone but these are stable with stable compression of T6 and L2.    Malignant neoplasm metastatic to bone [facility]) Bone metastases discovered 04/17/21. Enhancing osseous lesions are present in the L2  vertebral body, both iliac bones, and in the sacrum, highly concerning for osseous metastases. She received her 1st dose of zoledronic acid on January 16th.  I once again today explained to her and her son the reasoning for this medicine.  Her widespread bone metastases show an interval increase in sclerosis of previously lytic lesions consistent with treatment response.   Chest wall recurrence of breast cancer, left ([name]) Left supraclavicular lymph node measuring 2-3 cm. Also involvement of subpectoral node and chest wall skin. This was rapidly progressive despite receiving  trastuzumab.  We have added in Perjeta and Taxotere to her current regimen and this has been very effective.  The CT scan also shows good response of her adenopathy with resolution of the previous bulky left axillary and subpectoral adenopathy. In September of 2023, she had a 2 cm nodule  located close to the left shoulder associated with nodularity of the left upper chest wall and appeared to have obvious progression of disease again. She was changed to [facility] and is responding well. The nodules have completely resolved.    Lymphedema She does have lymphedema of the left arm; however, this is better today, but with significant edema of the hand as well. We will refer her to a lymphedema specialist and she requests one in [place], so we will arrange that. She sleeps with the hand and arm elevated.  She has decreased use of her left hand but I think this is due to more than just the lymphedema.She will be seeing the neurologist to complete the nerve conduction velocity test on her hand in March.    Plan: Her CT of chest, abdomen, and pelvis on 07/27/2022 looked good with stable liver lesions which  are subtle and at least 1 lesion is no longer visualized. She still has extensive sclerotic metastasis of the bone but these are stable with stable compression of T6 and L2.  She was really tired and groggy after her last treatment and I  believe it could've been from the benadryl given during her treatment. She is given 50mg  of benadryl prior to her infusions and I will decrease that to 25mg . Her WBC is 4.5, hemoglobin 12.5, and platelet count 148,000. Her CMP is normal besides a low creatinine of 0.50. 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 Her day 1 cycle 12 of Enhertu is scheduled on 09/14/2022. I will see her back in 3 weeks with CBC and CMP.The patient and her son understand the plans discussed today and are in agreement with them.  She knows to contact our office if she develops concerns prior to her next appointment.  I provided 20 minutes of face-to-face time during this this encounter and > 50% was spent counseling as documented under my assessment and plan.    [name], MD  [facility] AT [facility] [address] [place] [address] Dept: [phone] Dept Fax: [phone]   CHIEF COMPLAINT:  CC: An 89 year old female with metastatic breast cancer   Current Treatment:  Planning Enhertu  INTERVAL HISTORY:  November is here today for repeat clinical assessment for metastatic breast cancer. Patient states that she had a rough past week but has no complaints of pain at this time. The [place] clinic did nerve conduction tests but have not followed up with her. She is more concerned about getting her hand back to normal and is frustrated about not getting a response back. She was really tired and groggy after her last treatment and I believe it could've been from the benadryl given prior to her treatment. She is given 50mg  of benadryl and I will decrease that to 25mg . Her WBC is 4.5, hemoglobin 12.5, and platelet count 148,000. Her CMP is normal. 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 Her day 1 cycle  12 of Enhertu is scheduled on 09/14/2022. I will see her back in 3 weeks with CBC and CMP. She denies signs of infection such as sore throat, sinus drainage, cough, or urinary symptoms.  She denies fevers or recurrent chills. She denies pain. She denies nausea, vomiting, chest pain, dyspnea or cough. Her appetite is good and her weight has increased 3 pounds over last 3 weeks . This patient is accompanied in the office by her  son .   I have reviewed the past medical history, past surgical history, social history and family history with the patient and they are unchanged from previous note.  ALLERGIES:  has No Known Allergies.  MEDICATIONS:  Current Outpatient Medications  Medication Sig Dispense Refill   Biotin 1 MG CAPS Take by mouth.     fish oil-omega-3 fatty acids 1000 MG capsule Take 1 g by mouth daily.     gabapentin (NEURONTIN) 100 MG capsule Take 1 capsule (100 mg total) by mouth at bedtime. (Patient not taking: Reported on 03/05/2022) 30 capsule 5   magnesium citrate SOLN Take 0.5 Bottles by mouth as needed for severe constipation.     Multiple Vitamin (MULTIVITAMIN) capsule Take 1 capsule by mouth daily.  ondansetron (ZOFRAN) 4 MG tablet Take 1 tablet (4 mg total) by mouth every 8 (eight) hours as needed for nausea or vomiting. (Patient not taking: Reported on 03/05/2022) 20 tablet 2   polyethylene glycol (MIRALAX / GLYCOLAX) 17 g packet Take 17 g by mouth as needed. constipation     No current facility-administered medications for this visit.   HISTORY OF PRESENT ILLNESS:   Oncology History Overview Note  Cancer Staging Malignant neoplasm of upper-outer quadrant of left breast in female, estrogen receptor negative ([name]) Staging form: Breast, AJCC 8th Edition - Clinical stage from 10/11/2018: Stage IIB (cT2, cN1, cM0, G3, ER-, PR-, HER2+) - Signed by Malachy Mood, MD on 11/02/2018 - Clinical: No stage assigned - Unsigned    History of colon cancer, stage II  09/2005 Initial  Diagnosis   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 Findings were a 5.9 x 3.8 x 1.2 cm moderate to well-differentiated adenocarcinoma penetrating the muscular wall. Forty lymph nodes negative. No vascular or lymphatic invasion. Multiple diverticula with microabscess formation and inflammation. Carcinoma present in at least 1 diverticulum. Preop CEA 5.2 with lab normal range 0 to 2.5. Preop CT scan with no obvious additional pathology.    2007 -  Chemotherapy   She received oral Xeloda chemotherapy as an adjuvant. She declined treatment on a clinical trial.    11/12/2011 Initial Diagnosis   [name], stage II   12/09/2011 Procedure   Followup colonoscopy done on 12/09/2011. She was found to have 2 polyps which were removed. Chronic diverticulosis.     Malignant neoplasm of upper-outer quadrant of left breast in female, estrogen receptor negative ([name])  09/26/2018 Mammogram   Mammogram/US of left breast 09/26/18 IMPRESSION:  1. 2.9cm irregular mass in the upper outer left breast corresponds to the palpable abnormality. This is highly suspicious for breast carcinoma.  2. Two adjacent abnormal left axillary  Lymph nodes suspicious for metastatic adenopathy. There is a third borderline abnormal left axillary LN with a cortex thickened to 4mm.  3. Benign right breast cyst. No evidence of right breast malignancy.    10/11/2018 Cancer Staging   Staging form: Breast, AJCC 8th Edition - Clinical stage from 10/11/2018: Stage IIB (cT2, cN1, cM0, G3, ER-, PR-, HER2+) - Signed by Malachy Mood, MD on 11/02/2018   10/11/2018 Initial Biopsy   Diagnosis 10/11/18 1. Breast, left, needle core biopsy, upper outer left 2 o'clock - INVASIVE DUCTAL CARCINOMA. - DUCTAL CARCINOMA IN SITU. - LYMPHOVASCULAR INVASION IS IDENTIFIED. - SEE COMMENT. 2. Lymph node, needle/core biopsy, left axilla - INVASIVE DUCTAL  CARCINOMA. - SEE COMMENT.   10/11/2018 Receptors her2   The tumor cells are POSITIVE for Her2 (3+). Estrogen Receptor: 0%, NEGATIVE Progesterone Receptor: 0%, NEGATIVE Proliferation Marker Ki67: 70%   11/02/2018 Initial Diagnosis   Malignant neoplasm of upper-outer quadrant of left breast in female, estrogen receptor negative ([name])   11/15/2018 Breast MRI   MRI breast 11/15/18  IMPRESSION: 1. 3.3 centimeter mass in the LATERAL portion of the LEFT breast consistent with known malignancy. 2. There is significant non mass enhancement surrounding this mass and extending anteriorly into the nipple base, with largest diameter in the anterior to posterior axis, measuring 7.2 centimeters. 3. If the patient would consider breast conservation, additional MR guided core biopsies are recommended. Consider biopsy of the inferior and anterior extent of the non mass enhancement to document extent of  disease. 4. Three enlarged LEFT axillary lymph nodes. 5. RIGHT breast is negative.   11/15/2018 PET scan   PET 11/15/18 IMPRESSION: Hypermetabolic left breast lesion compatible with known primary. Hypermetabolic left axillary lymph nodes are consistent with metastatic disease.   No evidence for additional hypermetabolic metastatic involvement in the neck, chest, abdomen, or pelvis.   11/17/2018 - 03/01/2019 Chemotherapy   Neo-adjuvant Kadcyla and perjeta q3weeks for 6 cycles starting 11/17/18. Stopped before surgery    11/29/2018 Pathology Results   Diagnosis 1. Breast, left, needle core biopsy, inferior anterior (cylinder clip) - INVASIVE DUCTAL CARCINOMA. - DUCTAL CARCINOMA IN SITU. - LYMPHOVASCULAR INVASION IS IDENTIFIED. - SEE COMMENT. 2. Breast, left, needle core biopsy, central posterior (barbell clip) - INVASIVE DUCTAL CARCINOMA. - LYMPHOVASCULAR INVASION IS IDENTIFIED.   02/12/2019 Breast MRI   IMPRESSION: 1. Complete resolution of previously identified enhancing mass and associated non  mass enhancement within the left breast. This is consistent with excellent response to chemotherapy. No residual or suspicious findings are identified. 2. No MRI evidence of malignancy on the right. 3. Previously identified left axillary lymphadenopathy not definitively seen on today's study. However, this may be due to decreased field-of-view compared to prior study.     03/14/2019 Cancer Staging   Staging form: Breast, AJCC 8th Edition - Pathologic stage from 03/14/2019: pT0, pN0, cM0, GX, ER: Unknown, PR: Unknown, HER2: Not Assessed - Signed by Malachy Mood, MD on 03/28/2019   03/14/2019 Surgery   LEFT MASTECTOMY WITH TARGETED LYMPH NODE DISSECTION and Left Axillary Sentinel Lymph  Node Biopsy by Dr Carolynne Edouard 03/14/19    03/14/2019 Pathology Results   FINAL MICROSCOPIC DIAGNOSIS:   A. LYMPH NODE, LEFT, SENTINEL, BIOPSY:  - There is no evidence of carcinoma in 1 of 1 lymph node (0/1).   B. BREAST, LEFT, MASTECTOMY:  - Benign breast parenchyma with treatment-related changes.  - There is no evidence of malignancy.  - See oncology table below.   C. LYMPH NODE, LEFT #1, SENTINEL, BIOPSY:  - There is no evidence of carcinoma in 1 of 1 lymph node (0/1).   D. LYMPH NODE, LEFT #2, SENTINEL, BIOPSY:  - There is no evidence of carcinoma in 1 of 1 lymph node (0/1).     04/04/2019 - 04/25/2019 Chemotherapy   Maintenance Herceptin injections every 3 weeks starting 04/03/19 to complete 1 year of treatment that was started in 11/2018. Stopped after 2nd dose as she will not be repeating Echos.    06/03/2021 - 06/24/2021 Chemotherapy   Patient is on Treatment Plan : BREAST Trastuzumab q21d X 11 Cycles     06/25/2021 - 01/01/2022 Chemotherapy   Patient is on Treatment Plan : BREAST Docetaxel + Trastuzumab + Pertuzumab (THP) q21d     06/25/2021 - 01/02/2022 Chemotherapy   Patient is on Treatment Plan : BREAST Docetaxel + Trastuzumab + Pertuzumab (THP) q21d     09/14/2021 Imaging   CT  chest/abdomen/pelvis  IMPRESSION:  1. Interval resolution of previously seen bulky left axillary and  subpectoral lymphadenopathy. No persistently enlarged lymph nodes.  2. Multiple liver lesions are significantly diminished in size.  3. Widespread osseous metastatic disease, with an interval increase  in sclerosis of several previously lytic lesions.  4. Constellation of findings is consistent with treatment response  of metastatic disease  5. New, although age indeterminate pathologic wedge deformity of the  T6 vertebral body as well as an increased pathologic wedge deformity  of the L2 vertebral body.  6. Coronary artery disease.  Aortic  Atherosclerosis (ICD10-I70.0).    01/26/2022 -  Chemotherapy   Patient is on Treatment Plan : BREAST METASTATIC Fam-Trastuzumab Deruxtecan-nxki (Enhertu) (5.4) q21d     Malignant neoplasm metastatic to liver ([name])  04/17/2021 Imaging   CT ABDOMEN AND PELVIS WITH CONTRAST: -New lytic lesion involving the L2 vertebral body (6:72, 2:26) with minimal cortical breakthrough superiorly, but with preservation of vertebral body height, concerning for metastatic disease.  -There are several indeterminate hepatic lesions as described above. In addition to the lesions described above, there is an additional subtle 1.5 cm hypodensity in the hepatic dome (2:7). Given clinical history and presence of a new L2 lesion, leading differential consideration is metastatic disease. Recommend further evaluation with MRI of the abdomen with and without contrast.   05/12/2021 Imaging   MRI ABDOMEN WITH AND WITHOUT CONTRAST: Numerous heterogeneously enhancing, internally necrotic lesions in the right hepatic lobe, highly concerning for metastatic disease. Largest lesion measures up to 6.7 cm in hepatic segment VI.   Enhancing osseous lesions in the L2 vertebral body, both iliac bones, and in the sacrum, highly concerning for osseous metastases.   05/15/2021 Initial Diagnosis    Liver metastases ([name])   05/27/2021 PET scan   1. Widespread recurrent/metastatic disease in this patient who is  status post bilateral mastectomy. Left chest wall recurrence with  left axillary/subpectoral, supraclavicular nodal metastasis.  2. Hepatic, left adrenal, and widespread osseous metastasis.  3. Incidental findings, including: Right nephrolithiasis. Tiny  hiatal hernia.    05/27/2021 Imaging   MRI LUMBAR AND THORACIC SPINE: Thoracic spine:  Osseous metastatic disease involving each level. The most dramatic deposits are at T4 and T6 where there is extraosseous/epidural tumor. No cord compression but there is foraminal impingement on the left at T6-7 and on the right at T3-4. Early dorsal epidural tumor at the level of T10 and T3.   Lumbar spine:  1. Widespread osseous metastatic disease with largest deposit  replacing the L2 body where there is a compression fracture with  mild height loss. Also at this level is early epidural tumor  extension likely affecting both L2-3 foramina.  2. 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    05/27/2021 Imaging   MRI HEAD WITH AND WITHOUT CONTRAST: Negative for metastatic disease to the brain or calvarium.   06/03/2021 - 06/24/2021 Chemotherapy   Patient is on Treatment Plan : BREAST Trastuzumab q21d X 11 Cycles     06/25/2021 - 01/01/2022 Chemotherapy   Patient is on Treatment Plan : BREAST Docetaxel + Trastuzumab + Pertuzumab (THP) q21d     06/25/2021 - 01/02/2022 Chemotherapy   Patient is on Treatment Plan : BREAST Docetaxel + Trastuzumab + Pertuzumab (THP) q21d     09/14/2021 Imaging   CT chest/abdomen/pelvis  IMPRESSION:  1. Interval resolution of previously seen bulky left axillary and  subpectoral lymphadenopathy. No persistently enlarged lymph nodes.  2. Multiple liver lesions are significantly diminished in size.  3. Widespread osseous metastatic disease, with an interval increase  in sclerosis of  several previously lytic lesions.  4. Constellation of findings is consistent with treatment response  of metastatic disease  5. New, although age indeterminate pathologic wedge deformity of the  T6 vertebral body as well as an increased pathologic wedge deformity  of the L2 vertebral body.  6. Coronary artery disease.  Aortic Atherosclerosis (ICD10-I70.0).    01/26/2022 -  Chemotherapy   Patient is on Treatment Plan : BREAST METASTATIC Fam-Trastuzumab Deruxtecan-nxki (Enhertu) (5.4) q21d  Malignant neoplasm metastatic to bone ([name])  04/17/2021 Imaging   CT ABDOMEN AND PELVIS WITH CONTRAST: -New lytic lesion involving the L2 vertebral body (6:72, 2:26) with minimal cortical breakthrough superiorly, but with preservation of vertebral body height, concerning for metastatic disease.  -There are several indeterminate hepatic lesions as described above. In addition to the lesions described above, there is an additional subtle 1.5 cm hypodensity in the hepatic dome (2:7). Given clinical history and presence of a new L2 lesion, leading differential consideration is metastatic disease. Recommend further evaluation with MRI of the abdomen with and without contrast.   05/12/2021 Imaging   MRI ABDOMEN WITH AND WITHOUT CONTRAST: Numerous heterogeneously enhancing, internally necrotic lesions in the right hepatic lobe, highly concerning for metastatic disease. Largest lesion measures up to 6.7 cm in hepatic segment VI.   Enhancing osseous lesions in the L2 vertebral body, both iliac bones, and in the sacrum, highly concerning for osseous metastases.   05/15/2021 Initial Diagnosis   Bone metastases ([name])   05/27/2021 PET scan   1. Widespread recurrent/metastatic disease in this patient who is  status post bilateral mastectomy. Left chest wall recurrence with  left axillary/subpectoral, supraclavicular nodal metastasis.  2. Hepatic, left adrenal, and widespread osseous metastasis.  3. Incidental  findings, including: Right nephrolithiasis. Tiny  hiatal hernia.    05/27/2021 Imaging   MRI LUMBAR AND THORACIC SPINE: Thoracic spine:  Osseous metastatic disease involving each level. The most dramatic deposits are at T4 and T6 where there is extraosseous/epidural tumor. No cord compression but there is foraminal impingement on the left at T6-7 and on the right at T3-4. Early dorsal epidural tumor at the level of T10 and T3.   Lumbar spine:  1. Widespread osseous metastatic disease with largest deposit  replacing the L2 body where there is a compression fracture with  mild height loss. Also at this level is early epidural tumor  extension likely affecting both L2-3 foramina.  2. 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    05/27/2021 Imaging   MRI HEAD WITH AND WITHOUT CONTRAST: Negative for metastatic disease to the brain or calvarium.   06/03/2021 - 06/24/2021 Chemotherapy   Patient is on Treatment Plan : BREAST Trastuzumab q21d X 11 Cycles     06/25/2021 - 01/01/2022 Chemotherapy   Patient is on Treatment Plan : BREAST Docetaxel + Trastuzumab + Pertuzumab (THP) q21d     06/25/2021 - 01/02/2022 Chemotherapy   Patient is on Treatment Plan : BREAST Docetaxel + Trastuzumab + Pertuzumab (THP) q21d     09/14/2021 Imaging   CT chest/abdomen/pelvis  IMPRESSION:  1. Interval resolution of previously seen bulky left axillary and  subpectoral lymphadenopathy. No persistently enlarged lymph nodes.  2. Multiple liver lesions are significantly diminished in size.  3. Widespread osseous metastatic disease, with an interval increase  in sclerosis of several previously lytic lesions.  4. Constellation of findings is consistent with treatment response  of metastatic disease  5. New, although age indeterminate pathologic wedge deformity of the  T6 vertebral body as well as an increased pathologic wedge deformity  of the L2 vertebral body.  6. Coronary artery disease.   Aortic Atherosclerosis (ICD10-I70.0).    01/26/2022 -  Chemotherapy   Patient is on Treatment Plan : BREAST METASTATIC Fam-Trastuzumab Deruxtecan-nxki (Enhertu) (5.4) q21d       REVIEW OF SYSTEMS:   Review of Systems  Constitutional: Negative.  Negative for appetite change, chills, diaphoresis, fatigue, fever and unexpected weight change.  HENT:  Negative.  Negative for hearing loss, lump/mass, mouth sores, nosebleeds, sore throat, tinnitus, trouble swallowing and voice change.   Eyes:  Positive for eye problems (tearing of the eye). Negative for icterus.  Respiratory: Negative.  Negative for chest tightness, cough, hemoptysis, shortness of breath and wheezing.   Cardiovascular: Negative.  Negative for chest pain, leg swelling and palpitations.  Gastrointestinal:  Positive for constipation (occasionally). Negative for abdominal distention, abdominal pain, blood in stool, diarrhea, nausea, rectal pain and vomiting.  Endocrine: Negative.   Genitourinary: Negative.  Negative for bladder incontinence, difficulty urinating, dyspareunia, dysuria, frequency, hematuria, menstrual problem, nocturia, pelvic pain, vaginal bleeding and vaginal discharge.   Musculoskeletal:  Negative for arthralgias, back pain, flank pain, gait problem, myalgias, neck pain and neck stiffness.  Skin: Negative.  Negative for itching, rash and wound.  Neurological:  Positive for extremity weakness (of her left hand) and numbness (severe paresthesias inability to use her left hand). Negative for dizziness, gait problem, headaches, light-headedness, seizures and speech difficulty.  Hematological: Negative.  Negative for adenopathy. Does not bruise/bleed easily.  Psychiatric/Behavioral: Negative.  Negative for confusion, decreased concentration, depression, sleep disturbance and suicidal ideas. The patient is not nervous/anxious.    VITALS:  Blood pressure (!) 161/69, pulse 72, temperature 98 F (36.7 C), temperature source  Oral, resp. rate 16, height 5' 0.25" (1.53 m), weight 110 lb 12.8 oz (50.3 kg), SpO2 97 %.  Wt Readings from Last 3 Encounters:  09/14/22 108 lb (49 kg)  09/09/22 110 lb 12.8 oz (50.3 kg)  08/19/22 107 lb 14.4 oz (48.9 kg)    Body mass index is 21.46 kg/m.  Performance status (ECOG): 1 - Symptomatic but completely ambulatory  PHYSICAL EXAM:  Physical Exam Vitals and nursing note reviewed. Exam conducted with a chaperone present.  Constitutional:      General: She is not in acute distress.    Appearance: Normal appearance. She is normal weight. She is not ill-appearing, toxic-appearing or diaphoretic.  HENT:     Head: Normocephalic and atraumatic.     Right Ear: Tympanic membrane, ear canal and external ear normal. There is no impacted cerumen.     Left Ear: Tympanic membrane, ear canal and external ear normal. There is no impacted cerumen.     Nose: Nose normal. No congestion or rhinorrhea.     Mouth/Throat:     Mouth: Mucous membranes are moist.     Pharynx: Oropharynx is clear. No oropharyngeal exudate or posterior oropharyngeal erythema.  Eyes:     General: No scleral icterus.       Right eye: No discharge.        Left eye: No discharge.     Extraocular Movements: Extraocular movements intact.     Conjunctiva/sclera: Conjunctivae normal.     Pupils: Pupils are equal, round, and reactive to light.  Neck:     Vascular: No carotid bruit.  Cardiovascular:     Rate and Rhythm: Normal rate and regular rhythm.     Pulses: Normal pulses.     Heart sounds: Normal heart sounds. No murmur heard.    No friction rub. No gallop.  Pulmonary:     Effort: Pulmonary effort is normal. No respiratory distress.     Breath sounds: Normal breath sounds. No stridor. No wheezing, rhonchi or rales.  Chest:     Chest wall: No tenderness.  Breasts:    Right: Absent.     Left: Absent.     Comments: Left mastectomy is negative  with a stable mild erythema in the upper left medial  chest  Abdominal:     General: Bowel sounds are normal. There is no distension.     Palpations: Abdomen is soft. There is no mass.     Tenderness: There is no abdominal tenderness. There is no right CVA tenderness, left CVA tenderness, guarding or rebound.     Hernia: No hernia is present.  Musculoskeletal:        General: No swelling, tenderness, deformity or signs of injury. Normal range of motion.     Left upper arm: Edema present.     Left forearm: Edema present.     Cervical back: Normal range of motion and neck supple. No rigidity or tenderness.     Right lower leg: No edema.     Left lower leg: No edema.     Comments: Mild lymphedema of the left upper extremity  Lymphadenopathy:     Cervical: No cervical adenopathy.  Skin:    General: Skin is warm and dry.     Coloration: Skin is not jaundiced or pale.     Findings: No bruising, erythema (mild), lesion or rash.  Neurological:     Mental Status: She is alert and oriented to person, place, and time. Mental status is at baseline.     Cranial Nerves: No cranial nerve deficit.     Sensory: No sensory deficit.     Motor: Weakness (She has severe paresthesias of the left upper extremity with inability to use her left hand) present.     Coordination: Coordination normal.     Gait: Gait normal.     Deep Tendon Reflexes: Reflexes normal.  Psychiatric:        Mood and Affect: Mood normal.        Behavior: Behavior normal.        Thought Content: Thought content normal.        Judgment: Judgment normal.    LABORATORY DATA:  I have reviewed the data as listed    Component Value Date/Time   NA 138 09/09/2022 0000   NA 137 11/13/2012 1057   K 4.1 09/09/2022 0000   K 4.5 11/13/2012 1057   CL 108 09/09/2022 0000   CO2 26 (A) 09/09/2022 0000   CO2 26 11/13/2012 1057   GLUCOSE 103 (H) 05/06/2022 1002   GLUCOSE 90 11/13/2012 1057   BUN 18 09/09/2022 0000   BUN 18.3 11/13/2012 1057   CREATININE 0.5 09/09/2022 0000   CREATININE  0.53 05/06/2022 1002   CREATININE 0.8 11/13/2012 1057   CALCIUM 9.2 09/09/2022 0000   CALCIUM 9.7 11/13/2012 1057   PROT 6.2 (L) 05/06/2022 1002   PROT 7.4 11/13/2012 1057   ALBUMIN 3.7 09/09/2022 0000   ALBUMIN 3.9 11/13/2012 1057   AST 42 (A) 09/09/2022 0000   AST 28 05/06/2022 1002   AST 22 11/13/2012 1057   ALT 18 09/09/2022 0000   ALT 14 05/06/2022 1002   ALT 15 11/13/2012 1057   ALKPHOS 57 09/09/2022 0000   ALKPHOS 77 11/13/2012 1057   BILITOT 0.6 05/06/2022 1002   BILITOT 0.54 11/13/2012 1057   GFRNONAA >60 09/09/2022 0000   GFRAA >60 04/25/2019 1045   No results found for: "SPEP", "UPEP"  Lab Results  Component Value Date   WBC 4.5 09/09/2022   NEUTROABS 2.34 09/09/2022   HGB 12.5 09/09/2022   HCT 36 09/09/2022   MCV 100 (A) 04/15/2022   PLT 148 (A) 09/09/2022  Chemistry      Component Value Date/Time   NA 138 09/09/2022 0000   NA 137 11/13/2012 1057   K 4.1 09/09/2022 0000   K 4.5 11/13/2012 1057   CL 108 09/09/2022 0000   CO2 26 (A) 09/09/2022 0000   CO2 26 11/13/2012 1057   BUN 18 09/09/2022 0000   BUN 18.3 11/13/2012 1057   CREATININE 0.5 09/09/2022 0000   CREATININE 0.53 05/06/2022 1002   CREATININE 0.8 11/13/2012 1057   GLU 106 09/09/2022 0000      Component Value Date/Time   CALCIUM 9.2 09/09/2022 0000   CALCIUM 9.7 11/13/2012 1057   ALKPHOS 57 09/09/2022 0000   ALKPHOS 77 11/13/2012 1057   AST 42 (A) 09/09/2022 0000   AST 28 05/06/2022 1002   AST 22 11/13/2012 1057   ALT 18 09/09/2022 0000   ALT 14 05/06/2022 1002   ALT 15 11/13/2012 1057   BILITOT 0.6 05/06/2022 1002   BILITOT 0.54 11/13/2012 1057     RADIOGRAPHIC STUDIES: No results found.      I,Jasmine M Lassiter,acting as a scribe for [name], MD.,have documented all relevant documentation on the behalf of [name], MD,as directed by  [name], MD while in the presence of [name], MD.

## 2022-09-08 ENCOUNTER — Other Ambulatory Visit: Payer: Self-pay | Admitting: Oncology

## 2022-09-08 DIAGNOSIS — C787 Secondary malignant neoplasm of liver and intrahepatic bile duct: Secondary | ICD-10-CM

## 2022-09-08 DIAGNOSIS — Z171 Estrogen receptor negative status [ER-]: Secondary | ICD-10-CM

## 2022-09-08 DIAGNOSIS — C50912 Malignant neoplasm of unspecified site of left female breast: Secondary | ICD-10-CM

## 2022-09-08 DIAGNOSIS — C7951 Secondary malignant neoplasm of bone: Secondary | ICD-10-CM

## 2022-09-09 ENCOUNTER — Other Ambulatory Visit: Payer: Self-pay | Admitting: Oncology

## 2022-09-09 ENCOUNTER — Inpatient Hospital Stay: Payer: Medicare Other | Attending: Oncology | Admitting: Oncology

## 2022-09-09 ENCOUNTER — Encounter: Payer: Self-pay | Admitting: Oncology

## 2022-09-09 ENCOUNTER — Inpatient Hospital Stay: Payer: Medicare Other

## 2022-09-09 VITALS — BP 161/69 | HR 72 | Temp 98.0°F | Resp 16 | Ht 60.25 in | Wt 110.8 lb

## 2022-09-09 DIAGNOSIS — C787 Secondary malignant neoplasm of liver and intrahepatic bile duct: Secondary | ICD-10-CM

## 2022-09-09 DIAGNOSIS — C50412 Malignant neoplasm of upper-outer quadrant of left female breast: Secondary | ICD-10-CM

## 2022-09-09 DIAGNOSIS — C7951 Secondary malignant neoplasm of bone: Secondary | ICD-10-CM | POA: Diagnosis not present

## 2022-09-09 DIAGNOSIS — Z171 Estrogen receptor negative status [ER-]: Secondary | ICD-10-CM | POA: Diagnosis not present

## 2022-09-09 DIAGNOSIS — Z85038 Personal history of other malignant neoplasm of large intestine: Secondary | ICD-10-CM | POA: Insufficient documentation

## 2022-09-09 DIAGNOSIS — I890 Lymphedema, not elsewhere classified: Secondary | ICD-10-CM | POA: Insufficient documentation

## 2022-09-09 DIAGNOSIS — Z9012 Acquired absence of left breast and nipple: Secondary | ICD-10-CM | POA: Insufficient documentation

## 2022-09-09 LAB — COMPREHENSIVE METABOLIC PANEL
Albumin: 3.7 (ref 3.5–5.0)
Calcium: 9.2 (ref 8.7–10.7)

## 2022-09-09 LAB — BASIC METABOLIC PANEL
BUN: 18 (ref 4–21)
CO2: 26 — AB (ref 13–22)
Chloride: 108 (ref 99–108)
Creatinine: 0.5 (ref 0.5–1.1)
EGFR (Non-African Amer.): >60
Glucose: 106
Potassium: 4.1 mEq/L (ref 3.5–5.1)
Sodium: 138 (ref 137–147)

## 2022-09-09 LAB — CBC AND DIFFERENTIAL
HCT: 36 (ref 36–46)
Hemoglobin: 12.5 (ref 12.0–16.0)
Neutrophils Absolute: 2.34
Platelets: 148 10*3/uL — AB (ref 150–400)
WBC: 4.5

## 2022-09-09 LAB — HEPATIC FUNCTION PANEL
ALT: 18 U/L (ref 7–35)
AST: 42 — AB (ref 13–35)
Alkaline Phosphatase: 57 (ref 25–125)
Bilirubin, Total: 0.6

## 2022-09-09 LAB — CBC: RBC: 3.53 — AB (ref 3.87–5.11)

## 2022-09-10 ENCOUNTER — Other Ambulatory Visit: Payer: Self-pay

## 2022-09-13 MED FILL — Dexamethasone Sodium Phosphate Inj 100 MG/10ML: INTRAMUSCULAR | Qty: 1 | Status: AC

## 2022-09-13 MED FILL — Zoledronic Acid Inj Conc For IV Infusion 4 MG/5ML: INTRAVENOUS | Qty: 3.75 | Status: AC

## 2022-09-14 ENCOUNTER — Encounter: Payer: Self-pay | Admitting: Oncology

## 2022-09-14 ENCOUNTER — Inpatient Hospital Stay: Payer: Medicare Other

## 2022-09-14 VITALS — BP 159/79 | HR 70 | Temp 98.0°F | Resp 18 | Wt 108.0 lb

## 2022-09-14 DIAGNOSIS — I890 Lymphedema, not elsewhere classified: Secondary | ICD-10-CM | POA: Diagnosis not present

## 2022-09-14 DIAGNOSIS — C7951 Secondary malignant neoplasm of bone: Secondary | ICD-10-CM | POA: Diagnosis not present

## 2022-09-14 DIAGNOSIS — Z9012 Acquired absence of left breast and nipple: Secondary | ICD-10-CM | POA: Diagnosis not present

## 2022-09-14 DIAGNOSIS — Z5112 Encounter for antineoplastic immunotherapy: Secondary | ICD-10-CM | POA: Diagnosis present

## 2022-09-14 DIAGNOSIS — Z171 Estrogen receptor negative status [ER-]: Secondary | ICD-10-CM

## 2022-09-14 DIAGNOSIS — Z85038 Personal history of other malignant neoplasm of large intestine: Secondary | ICD-10-CM | POA: Diagnosis not present

## 2022-09-14 DIAGNOSIS — C787 Secondary malignant neoplasm of liver and intrahepatic bile duct: Secondary | ICD-10-CM | POA: Diagnosis present

## 2022-09-14 DIAGNOSIS — C50412 Malignant neoplasm of upper-outer quadrant of left female breast: Secondary | ICD-10-CM | POA: Diagnosis present

## 2022-09-14 MED ORDER — PALONOSETRON HCL INJECTION 0.25 MG/5ML
0.2500 mg | Freq: Once | INTRAVENOUS | Status: AC
  Administered 2022-09-14: 0.25 mg via INTRAVENOUS
  Filled 2022-09-14: qty 5

## 2022-09-14 MED ORDER — ACETAMINOPHEN 325 MG PO TABS
650.0000 mg | ORAL_TABLET | Freq: Once | ORAL | Status: AC
  Administered 2022-09-14: 650 mg via ORAL
  Filled 2022-09-14: qty 2

## 2022-09-14 MED ORDER — FAM-TRASTUZUMAB DERUXTECAN-NXKI CHEMO 100 MG IV SOLR
4.0500 mg/kg | Freq: Once | INTRAVENOUS | Status: AC
  Administered 2022-09-14: 200 mg via INTRAVENOUS
  Filled 2022-09-14: qty 10

## 2022-09-14 MED ORDER — DEXTROSE 5 % IV SOLN: Freq: Once | INTRAVENOUS | Status: AC

## 2022-09-14 MED ORDER — HEPARIN SOD (PORK) LOCK FLUSH 100 UNIT/ML IV SOLN
500.0000 [IU] | Freq: Once | INTRAVENOUS | Status: AC | PRN
  Administered 2022-09-14: 500 [IU]

## 2022-09-14 MED ORDER — ZOLEDRONIC ACID 4 MG/5ML IV CONC
3.0000 mg | Freq: Once | INTRAVENOUS | Status: AC
  Administered 2022-09-14: 3 mg via INTRAVENOUS
  Filled 2022-09-14: qty 3.75

## 2022-09-14 MED ORDER — SODIUM CHLORIDE 0.9 % IV SOLN
10.0000 mg | Freq: Once | INTRAVENOUS | Status: AC
  Administered 2022-09-14: 10 mg via INTRAVENOUS
  Filled 2022-09-14: qty 10

## 2022-09-14 MED ORDER — DIPHENHYDRAMINE HCL 25 MG PO CAPS
25.0000 mg | ORAL_CAPSULE | Freq: Once | ORAL | Status: AC
  Administered 2022-09-14: 25 mg via ORAL
  Filled 2022-09-14: qty 1

## 2022-09-14 MED ORDER — SODIUM CHLORIDE 0.9% FLUSH
10.0000 mL | INTRAVENOUS | Status: DC | PRN
  Administered 2022-09-14: 10 mL

## 2022-09-14 NOTE — Patient Instructions (Signed)
Fam-Trastuzumab Deruxtecan Injection What is this medication? FAM-TRASTUZUMAB DERUXTECAN (fam-tras TOOZ eu mab DER ux TEE kan) treats some types of cancer. It works by blocking a protein that causes cancer cells to grow and multiply. This helps to slow or stop the spread of cancer cells. This medicine may be used for other purposes; ask your health care provider or pharmacist if you have questions. COMMON BRAND NAME(S): ENHERTU What should I tell my care team before I take this medication? They need to know if you have any of these conditions: Heart disease Heart failure Infection, especially a viral infection, such as chickenpox, cold sores, or herpes Liver disease Lung or breathing disease, such as asthma or COPD An unusual or allergic reaction to fam-trastuzumab deruxtecan, other medications, foods, dyes, or preservatives Pregnant or trying to get pregnant Breast-feeding How should I use this medication? This medication is injected into a vein. It is given by your care team in a hospital or clinic setting. A special MedGuide will be given to you before each treatment. Be sure to read this information carefully each time. Talk to your care team about the use of this medication in children. Special care may be needed. Overdosage: If you think you have taken too much of this medicine contact a poison control center or emergency room at once. NOTE: This medicine is only for you. Do not share this medicine with others. What if I miss a dose? It is important not to miss your dose. Call your care team if you are unable to keep an appointment. What may interact with this medication? Interactions are not expected. This list may not describe all possible interactions. Give your health care provider a list of all the medicines, herbs, non-prescription drugs, or dietary supplements you use. Also tell them if you smoke, drink alcohol, or use illegal drugs. Some items may interact with your  medicine. What should I watch for while using this medication? Visit your care team for regular checks on your progress. Tell your care team if your symptoms do not start to get better or if they get worse. Your condition will be monitored carefully while you are receiving this medication. Do not become pregnant while taking this medication or for 7 months after stopping it. Women should inform their care team if they wish to become pregnant or think they might be pregnant. Men should not father a child while taking this medication and for 4 months after stopping it. There is potential for serious side effects to an unborn child. Talk to your care team for more information. Do not breast-feed an infant while taking this medication or for 7 months after the last dose. This medication has caused decreased sperm counts in some men. This may make it more difficult to father a child. Talk to your care team if you are concerned about your fertility. This medication may increase your risk to bruise or bleed. Call your care team if you notice any unusual bleeding. Be careful brushing or flossing your teeth or using a toothpick because you may get an infection or bleed more easily. If you have any dental work done, tell your dentist you are receiving this medication. This medication may cause dry eyes and blurred vision. If you wear contact lenses, you may feel some discomfort. Lubricating eye drops may help. See your care team if the problem does not go away or is severe. This medication may increase your risk of getting an infection. Call your care team for   advice if you get a fever, chills, sore throat, or other symptoms of a cold or flu. Do not treat yourself. Try to avoid being around people who are sick. Avoid taking medications that contain aspirin, acetaminophen, ibuprofen, naproxen, or ketoprofen unless instructed by your care team. These medications may hide a fever. What side effects may I notice from  receiving this medication? Side effects that you should report to your care team as soon as possible: Allergic reactions--skin rash, itching, hives, swelling of the face, lips, tongue, or throat Dry cough, shortness of breath or trouble breathing Infection--fever, chills, cough, sore throat, wounds that don't heal, pain or trouble when passing urine, general feeling of discomfort or being unwell Heart failure--shortness of breath, swelling of the ankles, feet, or hands, sudden weight gain, unusual weakness or fatigue Unusual bruising or bleeding Side effects that usually do not require medical attention (report these to your care team if they continue or are bothersome): Constipation Diarrhea Hair loss Muscle pain Nausea Vomiting This list may not describe all possible side effects. Call your doctor for medical advice about side effects. You may report side effects to FDA at 1-800-FDA-1088. Where should I keep my medication? This medication is given in a hospital or clinic. It will not be stored at home. NOTE: This sheet is a summary. It may not cover all possible information. If you have questions about this medicine, talk to your doctor, pharmacist, or health care provider.  2023 Elsevier/Gold Standard (2021-01-07 00:00:00) 

## 2022-09-15 ENCOUNTER — Telehealth: Payer: Self-pay

## 2022-09-15 NOTE — Telephone Encounter (Signed)
Patient will wait to discuss at follow-up appt.

## 2022-09-15 NOTE — Telephone Encounter (Signed)
Patient called and left message wanting to know why she needs a echocardiogram.

## 2022-09-23 ENCOUNTER — Encounter: Payer: Self-pay | Admitting: Oncology

## 2022-09-24 ENCOUNTER — Encounter: Payer: Self-pay | Admitting: Oncology

## 2022-09-29 NOTE — Progress Notes (Signed)
Patient Care Team: [name], MD as PCP - General (Family Medicine) [name], MD as Consulting Physician (Oncology)  Clinic Day: 09/30/2022  Referring physician: [name], MD  ASSESSMENT & PLAN:  Assessment & Plan: Malignant neoplasm of upper-outer quadrant of left breast in female, estrogen receptor negative ([name]) Stage IIB (T2c N1 M0) HER2 receptor positive left breast cancer, diagnosed in June 2020. This was ER/PR negative. This was treated with neoadjuvant Kadcyla/Perjeta and left mastectomy, with a complete response in breast and nodes. She had only received two doses of adjuvant Herceptin before being discontinued in December 2020.   History of colon cancer, stage II History of stage II colon cancer, diagnosed in May 2007. This was treated with surgical resection and adjuvant chemotherapy with Xeloda. Colonoscopy from 2013 revealed 2 polyps which were resected and chronic diverticulosis.   Malignant neoplasm metastatic to liver [facility]) Liver metastases discovered 04/17/21.  Numerous heterogeneously enhancing, internally necrotic lesions in the right hepatic lobe, consistent with metastatic disease. Largest lesion measures up to 6.7 cm and there are at least 6 lesions. We now have biopsy proven HER2 positive recurrent breast cancer with ER/PR negative. She received THP and tolerated it without significant difficulty.  CT scan showed excellent response with significant decrease in the size of her liver lesions. She later had progression of this disease and was switched to Enhertu. Her CT of chest, abdomen, and pelvis on 07/27/2022 looked good with stable liver lesions which are subtle and at least 1 lesion is no longer visualized.   Malignant neoplasm metastatic to bone [facility]) Bone metastases discovered 04/17/21. Enhancing osseous lesions are present in the L2 vertebral body, both iliac bones, and in the sacrum, highly concerning for osseous metastases. She received her 1st  dose of zoledronic acid on January 16th.  I once again today explained to her and her son the reasoning for this medicine.  Her widespread bone metastases show an interval increase in sclerosis of previously lytic lesions consistent with treatment response. She still has extensive sclerotic metastasis of the bone but these are stable with stable compression of T6 and L2.    Chest wall recurrence of breast cancer, left ([name]) Left supraclavicular lymph node measuring 2-3 cm. Also involvement of subpectoral node and chest wall skin. This was rapidly progressive despite receiving  trastuzumab.  We have added in Perjeta and Taxotere to her current regimen and this has been very effective.  The CT scan also shows good response of her adenopathy with resolution of the previous bulky left axillary and subpectoral adenopathy. In September of 2023, she had a 2 cm nodule  located close to the left shoulder associated with nodularity of the left upper chest wall and appeared to have obvious progression of disease again. She was changed to [facility] and is responding well. The nodules have completely resolved.    Lymphedema She does have lymphedema of the left arm; however, this is better today, but with significant edema of the hand as well. We will refer her to a lymphedema specialist and she requests one in [place], so we will arrange that. She sleeps with the hand and arm elevated.  She has decreased use of her left hand but I think this is due to more than just the lymphedema.She will be seeing the neurologist to complete the nerve conduction velocity test on her hand in March.    Plan: Her CT of chest, abdomen, and pelvis on 07/27/2022 looked good. She inquired about why  does she need a echocardiogram and I informed her that this is because the Enhertu  has a 2-3% risk of heart complications and considering her age, I want to be sure she is not at risk or has any side effects. She will have her echocardiogram on  10/21/2022. Her day 1 cycle 13 of Enhertu is scheduled on 10/05/2022. I will see her back in 3 weeks with CBC and CMP. The patient and her son understand the plans discussed today and are in agreement with them.  She knows to contact our office if she develops concerns prior to her next appointment.  I provided 16 minutes of face-to-face time during this this encounter and > 50% was spent counseling as documented under my assessment and plan.    [name], MD  [facility] [address] [place] [address] Dept: [phone] Dept Fax: [phone]   CHIEF COMPLAINT:  CC: An 89 year old female with metastatic breast cancer   Current Treatment:  Planning Enhertu  INTERVAL HISTORY:  Jasmin Lewis is here today for repeat clinical assessment for metastatic breast cancer. Patient states that she is well and complains of her left hand bothering her but continues to do physical therapy. She informed me that she had a recommendation to [facility] for for her left hand.  She inquired about why does she need a echocardiogram and I informed her that this is because the Enhertu  has a 2-3% risk of heart complications and considering her age, I want to be sure she is not at risk or has any side effects.  Her day 1 cycle 13 of Enhertu is scheduled on 10/05/2022. She will have her echocardiogram on 10/21/2022. I will see her back in 3 weeks with CBC and CMP. She denies signs of infection such as sore throat, sinus drainage, cough, or urinary symptoms.  She denies fevers or recurrent chills. She denies pain. She denies nausea, vomiting, chest pain, dyspnea or cough. Her appetite comes and goes and her weight has decreased 1 pounds over last 21 days .This patient is accompanied in the office by her  son as usual.   I have reviewed the past medical history, past surgical history, social history and family history with the patient and they are  unchanged from previous note.  ALLERGIES:  has No Known Allergies.  MEDICATIONS:  Current Outpatient Medications  Medication Sig Dispense Refill   Biotin 1 MG CAPS Take by mouth.     fish oil-omega-3 fatty acids 1000 MG capsule Take 1 g by mouth daily.     gabapentin (NEURONTIN) 100 MG capsule Take 1 capsule (100 mg total) by mouth at bedtime. (Patient not taking: Reported on 03/05/2022) 30 capsule 5   magnesium citrate SOLN Take 0.5 Bottles by mouth as needed for severe constipation.     Multiple Vitamin (MULTIVITAMIN) capsule Take 1 capsule by mouth daily.     ondansetron (ZOFRAN) 4 MG tablet Take 1 tablet (4 mg total) by mouth every 8 (eight) hours as needed for nausea or vomiting. (Patient not taking: Reported on 03/05/2022) 20 tablet 2   polyethylene glycol (MIRALAX / GLYCOLAX) 17 g packet Take 17 g by mouth as needed. constipation     No current facility-administered medications for this visit.   HISTORY OF PRESENT ILLNESS:   Oncology History Overview Note  Cancer Staging Malignant neoplasm of upper-outer quadrant of left breast in female, estrogen receptor negative ([name]) Staging form: Breast, AJCC  8th Edition - Clinical stage from 10/11/2018: Stage IIB (cT2, cN1, cM0, G3, ER-, PR-, HER2+) - Signed by Malachy Mood, MD on 11/02/2018 - Clinical: No stage assigned - Unsigned    History of colon cancer, stage II  09/2005 Initial Diagnosis   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 Findings were a 5.9 x 3.8 x 1.2 cm moderate to well-differentiated adenocarcinoma penetrating the muscular wall. Forty lymph nodes negative. No vascular or lymphatic invasion. Multiple diverticula with microabscess formation and inflammation. Carcinoma present in at least 1 diverticulum. Preop CEA 5.2 with lab normal range 0 to 2.5. Preop CT scan with no obvious additional pathology.    2007 -  Chemotherapy   She  received oral Xeloda chemotherapy as an adjuvant. She declined treatment on a clinical trial.    11/12/2011 Initial Diagnosis   [name], stage II   12/09/2011 Procedure   Followup colonoscopy done on 12/09/2011. She was found to have 2 polyps which were removed. Chronic diverticulosis.     Malignant neoplasm of upper-outer quadrant of left breast in female, estrogen receptor negative ([name])  09/26/2018 Mammogram   Mammogram/US of left breast 09/26/18 IMPRESSION:  1. 2.9cm irregular mass in the upper outer left breast corresponds to the palpable abnormality. This is highly suspicious for breast carcinoma.  2. Two adjacent abnormal left axillary  Lymph nodes suspicious for metastatic adenopathy. There is a third borderline abnormal left axillary LN with a cortex thickened to 4mm.  3. Benign right breast cyst. No evidence of right breast malignancy.    10/11/2018 Cancer Staging   Staging form: Breast, AJCC 8th Edition - Clinical stage from 10/11/2018: Stage IIB (cT2, cN1, cM0, G3, ER-, PR-, HER2+) - Signed by Malachy Mood, MD on 11/02/2018   10/11/2018 Initial Biopsy   Diagnosis 10/11/18 1. Breast, left, needle core biopsy, upper outer left 2 o'clock - INVASIVE DUCTAL CARCINOMA. - DUCTAL CARCINOMA IN SITU. - LYMPHOVASCULAR INVASION IS IDENTIFIED. - SEE COMMENT. 2. Lymph node, needle/core biopsy, left axilla - INVASIVE DUCTAL CARCINOMA. - SEE COMMENT.   10/11/2018 Receptors her2   The tumor cells are POSITIVE for Her2 (3+). Estrogen Receptor: 0%, NEGATIVE Progesterone Receptor: 0%, NEGATIVE Proliferation Marker Ki67: 70%   11/02/2018 Initial Diagnosis   Malignant neoplasm of upper-outer quadrant of left breast in female, estrogen receptor negative ([name])   11/15/2018 Breast MRI   MRI breast 11/15/18  IMPRESSION: 1. 3.3 centimeter mass in the LATERAL portion of the LEFT breast consistent with known malignancy. 2. There is significant non mass enhancement surrounding this mass and extending  anteriorly into the nipple base, with largest diameter in the anterior to posterior axis, measuring 7.2 centimeters. 3. If the patient would consider breast conservation, additional MR guided core biopsies are recommended. Consider biopsy of the inferior and anterior extent of the non mass enhancement to document extent of disease. 4. Three enlarged LEFT axillary lymph nodes. 5. RIGHT breast is negative.   11/15/2018 PET scan   PET 11/15/18 IMPRESSION: Hypermetabolic left breast lesion compatible with known primary. Hypermetabolic left axillary lymph nodes are consistent with metastatic disease.   No evidence for additional hypermetabolic metastatic involvement in the neck, chest, abdomen, or pelvis.   11/17/2018 - 03/01/2019 Chemotherapy   Neo-adjuvant Kadcyla and perjeta q3weeks for 6 cycles starting 11/17/18. Stopped before surgery    11/29/2018 Pathology Results   Diagnosis 1. Breast, left, needle core biopsy, inferior  anterior (cylinder clip) - INVASIVE DUCTAL CARCINOMA. - DUCTAL CARCINOMA IN SITU. - LYMPHOVASCULAR INVASION IS IDENTIFIED. - SEE COMMENT. 2. Breast, left, needle core biopsy, central posterior (barbell clip) - INVASIVE DUCTAL CARCINOMA. - LYMPHOVASCULAR INVASION IS IDENTIFIED.   02/12/2019 Breast MRI   IMPRESSION: 1. Complete resolution of previously identified enhancing mass and associated non mass enhancement within the left breast. This is consistent with excellent response to chemotherapy. No residual or suspicious findings are identified. 2. No MRI evidence of malignancy on the right. 3. Previously identified left axillary lymphadenopathy not definitively seen on today's study. However, this may be due to decreased field-of-view compared to prior study.     03/14/2019 Cancer Staging   Staging form: Breast, AJCC 8th Edition - Pathologic stage from 03/14/2019: pT0, pN0, cM0, GX, ER: Unknown, PR: Unknown, HER2: Not Assessed - Signed by Malachy Mood, MD on  03/28/2019   03/14/2019 Surgery   LEFT MASTECTOMY WITH TARGETED LYMPH NODE DISSECTION and Left Axillary Sentinel Lymph  Node Biopsy by Dr Carolynne Edouard 03/14/19    03/14/2019 Pathology Results   FINAL MICROSCOPIC DIAGNOSIS:   A. LYMPH NODE, LEFT, SENTINEL, BIOPSY:  - There is no evidence of carcinoma in 1 of 1 lymph node (0/1).   B. BREAST, LEFT, MASTECTOMY:  - Benign breast parenchyma with treatment-related changes.  - There is no evidence of malignancy.  - See oncology table below.   C. LYMPH NODE, LEFT #1, SENTINEL, BIOPSY:  - There is no evidence of carcinoma in 1 of 1 lymph node (0/1).   D. LYMPH NODE, LEFT #2, SENTINEL, BIOPSY:  - There is no evidence of carcinoma in 1 of 1 lymph node (0/1).     04/04/2019 - 04/25/2019 Chemotherapy   Maintenance Herceptin injections every 3 weeks starting 04/03/19 to complete 1 year of treatment that was started in 11/2018. Stopped after 2nd dose as she will not be repeating Echos.    06/03/2021 - 06/24/2021 Chemotherapy   Patient is on Treatment Plan : BREAST Trastuzumab q21d X 11 Cycles     06/25/2021 - 01/01/2022 Chemotherapy   Patient is on Treatment Plan : BREAST Docetaxel + Trastuzumab + Pertuzumab (THP) q21d     06/25/2021 - 01/02/2022 Chemotherapy   Patient is on Treatment Plan : BREAST Docetaxel + Trastuzumab + Pertuzumab (THP) q21d     09/14/2021 Imaging   CT chest/abdomen/pelvis  IMPRESSION:  1. Interval resolution of previously seen bulky left axillary and  subpectoral lymphadenopathy. No persistently enlarged lymph nodes.  2. Multiple liver lesions are significantly diminished in size.  3. Widespread osseous metastatic disease, with an interval increase  in sclerosis of several previously lytic lesions.  4. Constellation of findings is consistent with treatment response  of metastatic disease  5. New, although age indeterminate pathologic wedge deformity of the  T6 vertebral body as well as an increased pathologic wedge deformity  of  the L2 vertebral body.  6. Coronary artery disease.  Aortic Atherosclerosis (ICD10-I70.0).    01/26/2022 -  Chemotherapy   Patient is on Treatment Plan : BREAST METASTATIC Fam-Trastuzumab Deruxtecan-nxki (Enhertu) (5.4) q21d     Malignant neoplasm metastatic to liver ([name])  04/17/2021 Imaging   CT ABDOMEN AND PELVIS WITH CONTRAST: -New lytic lesion involving the L2 vertebral body (6:72, 2:26) with minimal cortical breakthrough superiorly, but with preservation of vertebral body height, concerning for metastatic disease.  -There are several indeterminate hepatic lesions as described above. In addition to the lesions described above, there is an additional subtle 1.5  cm hypodensity in the hepatic dome (2:7). Given clinical history and presence of a new L2 lesion, leading differential consideration is metastatic disease. Recommend further evaluation with MRI of the abdomen with and without contrast.   05/12/2021 Imaging   MRI ABDOMEN WITH AND WITHOUT CONTRAST: Numerous heterogeneously enhancing, internally necrotic lesions in the right hepatic lobe, highly concerning for metastatic disease. Largest lesion measures up to 6.7 cm in hepatic segment VI.   Enhancing osseous lesions in the L2 vertebral body, both iliac bones, and in the sacrum, highly concerning for osseous metastases.   05/15/2021 Initial Diagnosis   Liver metastases ([name])   05/27/2021 PET scan   1. Widespread recurrent/metastatic disease in this patient who is  status post bilateral mastectomy. Left chest wall recurrence with  left axillary/subpectoral, supraclavicular nodal metastasis.  2. Hepatic, left adrenal, and widespread osseous metastasis.  3. Incidental findings, including: Right nephrolithiasis. Tiny  hiatal hernia.    05/27/2021 Imaging   MRI LUMBAR AND THORACIC SPINE: Thoracic spine:  Osseous metastatic disease involving each level. The most dramatic deposits are at T4 and T6 where there is extraosseous/epidural tumor.  No cord compression but there is foraminal impingement on the left at T6-7 and on the right at T3-4. Early dorsal epidural tumor at the level of T10 and T3.   Lumbar spine:  1. Widespread osseous metastatic disease with largest deposit  replacing the L2 body where there is a compression fracture with  mild height loss. Also at this level is early epidural tumor  extension likely affecting both L2-3 foramina.  2. 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    05/27/2021 Imaging   MRI HEAD WITH AND WITHOUT CONTRAST: Negative for metastatic disease to the brain or calvarium.   06/03/2021 - 06/24/2021 Chemotherapy   Patient is on Treatment Plan : BREAST Trastuzumab q21d X 11 Cycles     06/25/2021 - 01/01/2022 Chemotherapy   Patient is on Treatment Plan : BREAST Docetaxel + Trastuzumab + Pertuzumab (THP) q21d     06/25/2021 - 01/02/2022 Chemotherapy   Patient is on Treatment Plan : BREAST Docetaxel + Trastuzumab + Pertuzumab (THP) q21d     09/14/2021 Imaging   CT chest/abdomen/pelvis  IMPRESSION:  1. Interval resolution of previously seen bulky left axillary and  subpectoral lymphadenopathy. No persistently enlarged lymph nodes.  2. Multiple liver lesions are significantly diminished in size.  3. Widespread osseous metastatic disease, with an interval increase  in sclerosis of several previously lytic lesions.  4. Constellation of findings is consistent with treatment response  of metastatic disease  5. New, although age indeterminate pathologic wedge deformity of the  T6 vertebral body as well as an increased pathologic wedge deformity  of the L2 vertebral body.  6. Coronary artery disease.  Aortic Atherosclerosis (ICD10-I70.0).    01/26/2022 -  Chemotherapy   Patient is on Treatment Plan : BREAST METASTATIC Fam-Trastuzumab Deruxtecan-nxki (Enhertu) (5.4) q21d     Malignant neoplasm metastatic to bone ([name])  04/17/2021 Imaging   CT ABDOMEN AND PELVIS WITH  CONTRAST: -New lytic lesion involving the L2 vertebral body (6:72, 2:26) with minimal cortical breakthrough superiorly, but with preservation of vertebral body height, concerning for metastatic disease.  -There are several indeterminate hepatic lesions as described above. In addition to the lesions described above, there is an additional subtle 1.5 cm hypodensity in the hepatic dome (2:7). Given clinical history and presence of a new L2 lesion, leading differential consideration is metastatic disease. Recommend further evaluation with  MRI of the abdomen with and without contrast.   05/12/2021 Imaging   MRI ABDOMEN WITH AND WITHOUT CONTRAST: Numerous heterogeneously enhancing, internally necrotic lesions in the right hepatic lobe, highly concerning for metastatic disease. Largest lesion measures up to 6.7 cm in hepatic segment VI.   Enhancing osseous lesions in the L2 vertebral body, both iliac bones, and in the sacrum, highly concerning for osseous metastases.   05/15/2021 Initial Diagnosis   Bone metastases ([name])   05/27/2021 PET scan   1. Widespread recurrent/metastatic disease in this patient who is  status post bilateral mastectomy. Left chest wall recurrence with  left axillary/subpectoral, supraclavicular nodal metastasis.  2. Hepatic, left adrenal, and widespread osseous metastasis.  3. Incidental findings, including: Right nephrolithiasis. Tiny  hiatal hernia.    05/27/2021 Imaging   MRI LUMBAR AND THORACIC SPINE: Thoracic spine:  Osseous metastatic disease involving each level. The most dramatic deposits are at T4 and T6 where there is extraosseous/epidural tumor. No cord compression but there is foraminal impingement on the left at T6-7 and on the right at T3-4. Early dorsal epidural tumor at the level of T10 and T3.   Lumbar spine:  1. Widespread osseous metastatic disease with largest deposit  replacing the L2 body where there is a compression fracture with  mild height loss. Also  at this level is early epidural tumor  extension likely affecting both L2-3 foramina.  2. 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    05/27/2021 Imaging   MRI HEAD WITH AND WITHOUT CONTRAST: Negative for metastatic disease to the brain or calvarium.   06/03/2021 - 06/24/2021 Chemotherapy   Patient is on Treatment Plan : BREAST Trastuzumab q21d X 11 Cycles     06/25/2021 - 01/01/2022 Chemotherapy   Patient is on Treatment Plan : BREAST Docetaxel + Trastuzumab + Pertuzumab (THP) q21d     06/25/2021 - 01/02/2022 Chemotherapy   Patient is on Treatment Plan : BREAST Docetaxel + Trastuzumab + Pertuzumab (THP) q21d     09/14/2021 Imaging   CT chest/abdomen/pelvis  IMPRESSION:  1. Interval resolution of previously seen bulky left axillary and  subpectoral lymphadenopathy. No persistently enlarged lymph nodes.  2. Multiple liver lesions are significantly diminished in size.  3. Widespread osseous metastatic disease, with an interval increase  in sclerosis of several previously lytic lesions.  4. Constellation of findings is consistent with treatment response  of metastatic disease  5. New, although age indeterminate pathologic wedge deformity of the  T6 vertebral body as well as an increased pathologic wedge deformity  of the L2 vertebral body.  6. Coronary artery disease.  Aortic Atherosclerosis (ICD10-I70.0).    01/26/2022 -  Chemotherapy   Patient is on Treatment Plan : BREAST METASTATIC Fam-Trastuzumab Deruxtecan-nxki (Enhertu) (5.4) q21d       REVIEW OF SYSTEMS:   Review of Systems  Constitutional: Negative.  Negative for appetite change, chills, diaphoresis, fatigue, fever and unexpected weight change.  HENT:  Negative.  Negative for hearing loss, lump/mass, mouth sores, nosebleeds, sore throat, tinnitus, trouble swallowing and voice change.   Eyes:  Positive for eye problems (tearing of the eye). Negative for icterus.  Respiratory: Negative.  Negative for  chest tightness, cough, hemoptysis, shortness of breath and wheezing.   Cardiovascular: Negative.  Negative for chest pain, leg swelling and palpitations.  Gastrointestinal:  Positive for constipation (occasionally). Negative for abdominal distention, abdominal pain, blood in stool, diarrhea, nausea, rectal pain and vomiting.  Endocrine: Negative.   Genitourinary: Negative.  Negative for bladder incontinence, difficulty urinating, dyspareunia, dysuria, frequency, hematuria, menstrual problem, nocturia, pelvic pain, vaginal bleeding and vaginal discharge.   Musculoskeletal:  Negative for arthralgias, back pain, flank pain, gait problem, myalgias, neck pain and neck stiffness.       Chronic left hand/nerve pain  Skin: Negative.  Negative for itching, rash and wound.  Neurological:  Positive for extremity weakness (of her left hand) and numbness (severe paresthesias inability to use her left hand). Negative for dizziness, gait problem, headaches, light-headedness, seizures and speech difficulty.  Hematological: Negative.  Negative for adenopathy. Does not bruise/bleed easily.  Psychiatric/Behavioral: Negative.  Negative for confusion, decreased concentration, depression, sleep disturbance and suicidal ideas. The patient is not nervous/anxious.    VITALS:  Blood pressure (!) 161/68, pulse 62, temperature 98 F (36.7 C), temperature source Oral, resp. rate 18, height 5' 0.25" (1.53 m), weight 109 lb 14.4 oz (49.9 kg), SpO2 96 %.  Wt Readings from Last 3 Encounters:  10/05/22 109 lb (49.4 kg)  09/30/22 109 lb 14.4 oz (49.9 kg)  09/14/22 108 lb (49 kg)    Body mass index is 21.29 kg/m.  Performance status (ECOG): 1 - Symptomatic but completely ambulatory  PHYSICAL EXAM:  Physical Exam Vitals and nursing note reviewed. Exam conducted with a chaperone present.  Constitutional:      General: She is not in acute distress.    Appearance: Normal appearance. She is normal weight. She is not  ill-appearing, toxic-appearing or diaphoretic.  HENT:     Head: Normocephalic and atraumatic.     Right Ear: Tympanic membrane, ear canal and external ear normal. There is no impacted cerumen.     Left Ear: Tympanic membrane, ear canal and external ear normal. There is no impacted cerumen.     Nose: Nose normal. No congestion or rhinorrhea.     Mouth/Throat:     Mouth: Mucous membranes are moist.     Pharynx: Oropharynx is clear. No oropharyngeal exudate or posterior oropharyngeal erythema.  Eyes:     General: No scleral icterus.       Right eye: No discharge.        Left eye: No discharge.     Extraocular Movements: Extraocular movements intact.     Conjunctiva/sclera: Conjunctivae normal.     Pupils: Pupils are equal, round, and reactive to light.  Neck:     Vascular: No carotid bruit.  Cardiovascular:     Rate and Rhythm: Normal rate and regular rhythm.     Pulses: Normal pulses.     Heart sounds: Normal heart sounds. No murmur heard.    No friction rub. No gallop.  Pulmonary:     Effort: Pulmonary effort is normal. No respiratory distress.     Breath sounds: Normal breath sounds. No stridor. No wheezing, rhonchi or rales.  Chest:     Chest wall: No tenderness.  Breasts:    Right: Absent.     Left: Absent.     Comments: Left mastectomy is negative. Right breast is without masses Abdominal:     General: Bowel sounds are normal. There is no distension.     Palpations: Abdomen is soft. There is no mass.     Tenderness: There is no abdominal tenderness. There is no right CVA tenderness, left CVA tenderness, guarding or rebound.     Hernia: No hernia is present.  Musculoskeletal:        General: No swelling, tenderness, deformity or signs of injury. Normal range of motion.  Left upper arm: Edema present.     Left forearm: Edema present.     Cervical back: Normal range of motion and neck supple. No rigidity or tenderness.     Right lower leg: No edema.     Left lower leg:  No edema.     Comments: Mild lymphedema of the left upper extremity  Lymphadenopathy:     Cervical: No cervical adenopathy.  Skin:    General: Skin is warm and dry.     Coloration: Skin is not jaundiced or pale.     Findings: No bruising, erythema (mild), lesion or rash.  Neurological:     Mental Status: She is alert and oriented to person, place, and time. Mental status is at baseline.     Cranial Nerves: No cranial nerve deficit.     Sensory: No sensory deficit.     Motor: Weakness (She has severe paresthesias of the left upper extremity with inability to use her left hand) present.     Coordination: Coordination normal.     Gait: Gait normal.     Deep Tendon Reflexes: Reflexes normal.  Psychiatric:        Mood and Affect: Mood normal.        Behavior: Behavior normal.        Thought Content: Thought content normal.        Judgment: Judgment normal.    LABORATORY DATA:  I have reviewed the data as listed    Component Value Date/Time   NA 139 09/30/2022 1357   NA 138 09/09/2022 0000   NA 137 11/13/2012 1057   K 4.1 09/30/2022 1357   K 4.5 11/13/2012 1057   CL 108 09/30/2022 1357   CO2 26 09/30/2022 1357   CO2 26 11/13/2012 1057   GLUCOSE 101 (H) 09/30/2022 1357   GLUCOSE 90 11/13/2012 1057   BUN 18 09/30/2022 1357   BUN 18 09/09/2022 0000   BUN 18.3 11/13/2012 1057   CREATININE 0.76 09/30/2022 1357   CREATININE 0.8 11/13/2012 1057   CALCIUM 9.3 09/30/2022 1357   CALCIUM 9.7 11/13/2012 1057   PROT 6.6 09/30/2022 1357   PROT 7.4 11/13/2012 1057   ALBUMIN 3.5 09/30/2022 1357   ALBUMIN 3.9 11/13/2012 1057   AST 36 09/30/2022 1357   AST 22 11/13/2012 1057   ALT 17 09/30/2022 1357   ALT 15 11/13/2012 1057   ALKPHOS 47 09/30/2022 1357   ALKPHOS 77 11/13/2012 1057   BILITOT 0.3 09/30/2022 1357   BILITOT 0.54 11/13/2012 1057   GFRNONAA >60 09/30/2022 1357   GFRNONAA >60 09/09/2022 0000   GFRAA >60 04/25/2019 1045   No results found for: "SPEP", "UPEP"  Lab  Results  Component Value Date   WBC 4.8 09/30/2022   NEUTROABS 2.21 09/30/2022   HGB 12.6 09/30/2022   HCT 36 09/30/2022   MCV 100 (A) 04/15/2022   PLT 156 09/30/2022     Chemistry      Component Value Date/Time   NA 139 09/30/2022 1357   NA 138 09/09/2022 0000   NA 137 11/13/2012 1057   K 4.1 09/30/2022 1357   K 4.5 11/13/2012 1057   CL 108 09/30/2022 1357   CO2 26 09/30/2022 1357   CO2 26 11/13/2012 1057   BUN 18 09/30/2022 1357   BUN 18 09/09/2022 0000   BUN 18.3 11/13/2012 1057   CREATININE 0.76 09/30/2022 1357   CREATININE 0.8 11/13/2012 1057   GLU 106 09/09/2022 0000      Component  Value Date/Time   CALCIUM 9.3 09/30/2022 1357   CALCIUM 9.7 11/13/2012 1057   ALKPHOS 47 09/30/2022 1357   ALKPHOS 77 11/13/2012 1057   AST 36 09/30/2022 1357   AST 22 11/13/2012 1057   ALT 17 09/30/2022 1357   ALT 15 11/13/2012 1057   BILITOT 0.3 09/30/2022 1357   BILITOT 0.54 11/13/2012 1057     RADIOGRAPHIC STUDIES:      I,Jasmine M Lassiter,acting as a scribe for [name], MD.,have documented all relevant documentation on the behalf of [name], MD,as directed by  [name], MD while in the presence of [name], MD.

## 2022-09-30 ENCOUNTER — Encounter: Payer: Self-pay | Admitting: Oncology

## 2022-09-30 ENCOUNTER — Inpatient Hospital Stay: Payer: Medicare Other

## 2022-09-30 ENCOUNTER — Inpatient Hospital Stay (INDEPENDENT_AMBULATORY_CARE_PROVIDER_SITE_OTHER): Payer: Medicare Other | Admitting: Oncology

## 2022-09-30 VITALS — BP 161/68 | HR 62 | Temp 98.0°F | Resp 18 | Ht 60.25 in | Wt 109.9 lb

## 2022-09-30 DIAGNOSIS — C787 Secondary malignant neoplasm of liver and intrahepatic bile duct: Secondary | ICD-10-CM

## 2022-09-30 DIAGNOSIS — C7989 Secondary malignant neoplasm of other specified sites: Secondary | ICD-10-CM

## 2022-09-30 DIAGNOSIS — C50912 Malignant neoplasm of unspecified site of left female breast: Secondary | ICD-10-CM

## 2022-09-30 DIAGNOSIS — C7951 Secondary malignant neoplasm of bone: Secondary | ICD-10-CM

## 2022-09-30 DIAGNOSIS — Z171 Estrogen receptor negative status [ER-]: Secondary | ICD-10-CM

## 2022-09-30 DIAGNOSIS — C50412 Malignant neoplasm of upper-outer quadrant of left female breast: Secondary | ICD-10-CM | POA: Diagnosis not present

## 2022-09-30 LAB — CMP (CANCER CENTER ONLY)
ALT: 17 U/L (ref 0–44)
AST: 36 U/L (ref 15–41)
Albumin: 3.5 g/dL (ref 3.5–5.0)
Alkaline Phosphatase: 47 U/L (ref 38–126)
Anion gap: 5 (ref 5–15)
BUN: 18 mg/dL (ref 8–23)
CO2: 26 mmol/L (ref 22–32)
Calcium: 9.3 mg/dL (ref 8.9–10.3)
Chloride: 108 mmol/L (ref 98–111)
Creatinine: 0.76 mg/dL (ref 0.44–1.00)
GFR, Estimated: >60 mL/min (ref >60)
Glucose, Bld: 101 mg/dL — ABNORMAL HIGH (ref 70–99)
Potassium: 4.1 mmol/L (ref 3.5–5.1)
Sodium: 139 mmol/L (ref 135–145)
Total Bilirubin: 0.3 mg/dL (ref 0.3–1.2)
Total Protein: 6.6 g/dL (ref 6.5–8.1)

## 2022-09-30 LAB — CBC AND DIFFERENTIAL
HCT: 36 (ref 36–46)
Hemoglobin: 12.6 (ref 12.0–16.0)
Neutrophils Absolute: 2.21
Platelets: 156 10*3/uL (ref 150–400)
WBC: 4.8

## 2022-09-30 LAB — CBC: RBC: 3.5 — AB (ref 3.87–5.11)

## 2022-10-01 ENCOUNTER — Other Ambulatory Visit: Payer: Self-pay

## 2022-10-01 MED FILL — Dexamethasone Sodium Phosphate Inj 100 MG/10ML: INTRAMUSCULAR | Qty: 1 | Status: AC

## 2022-10-05 ENCOUNTER — Inpatient Hospital Stay: Payer: Medicare Other

## 2022-10-05 VITALS — BP 144/62 | HR 84 | Temp 97.6°F | Resp 20 | Ht 60.25 in | Wt 109.0 lb

## 2022-10-05 DIAGNOSIS — C7951 Secondary malignant neoplasm of bone: Secondary | ICD-10-CM | POA: Diagnosis not present

## 2022-10-05 DIAGNOSIS — C787 Secondary malignant neoplasm of liver and intrahepatic bile duct: Secondary | ICD-10-CM

## 2022-10-05 DIAGNOSIS — Z171 Estrogen receptor negative status [ER-]: Secondary | ICD-10-CM

## 2022-10-05 MED ORDER — SODIUM CHLORIDE 0.9 % IV SOLN
10.0000 mg | Freq: Once | INTRAVENOUS | Status: AC
  Administered 2022-10-05: 10 mg via INTRAVENOUS
  Filled 2022-10-05: qty 10

## 2022-10-05 MED ORDER — FAM-TRASTUZUMAB DERUXTECAN-NXKI CHEMO 100 MG IV SOLR
4.0500 mg/kg | Freq: Once | INTRAVENOUS | Status: AC
  Administered 2022-10-05: 200 mg via INTRAVENOUS
  Filled 2022-10-05: qty 10

## 2022-10-05 MED ORDER — SODIUM CHLORIDE 0.9% FLUSH
10.0000 mL | INTRAVENOUS | Status: DC | PRN
  Administered 2022-10-05: 10 mL

## 2022-10-05 MED ORDER — PALONOSETRON HCL INJECTION 0.25 MG/5ML
0.2500 mg | Freq: Once | INTRAVENOUS | Status: AC
  Administered 2022-10-05: 0.25 mg via INTRAVENOUS
  Filled 2022-10-05: qty 5

## 2022-10-05 MED ORDER — HEPARIN SOD (PORK) LOCK FLUSH 100 UNIT/ML IV SOLN
500.0000 [IU] | Freq: Once | INTRAVENOUS | Status: AC | PRN
  Administered 2022-10-05: 500 [IU]

## 2022-10-05 MED ORDER — DIPHENHYDRAMINE HCL 25 MG PO CAPS
25.0000 mg | ORAL_CAPSULE | Freq: Once | ORAL | Status: AC
  Administered 2022-10-05: 25 mg via ORAL
  Filled 2022-10-05: qty 1

## 2022-10-05 MED ORDER — ACETAMINOPHEN 325 MG PO TABS
650.0000 mg | ORAL_TABLET | Freq: Once | ORAL | Status: AC
  Administered 2022-10-05: 650 mg via ORAL
  Filled 2022-10-05: qty 2

## 2022-10-05 MED ORDER — DEXTROSE 5 % IV SOLN: Freq: Once | INTRAVENOUS | Status: AC

## 2022-10-05 NOTE — Patient Instructions (Signed)
Fam-Trastuzumab Deruxtecan Injection What is this medication? FAM-TRASTUZUMAB DERUXTECAN (fam-tras TOOZ eu mab DER ux TEE kan) treats some types of cancer. It works by blocking a protein that causes cancer cells to grow and multiply. This helps to slow or stop the spread of cancer cells. This medicine may be used for other purposes; ask your health care provider or pharmacist if you have questions. COMMON BRAND NAME(S): ENHERTU What should I tell my care team before I take this medication? They need to know if you have any of these conditions: Heart disease Heart failure Infection, especially a viral infection, such as chickenpox, cold sores, or herpes Liver disease Lung or breathing disease, such as asthma or COPD An unusual or allergic reaction to fam-trastuzumab deruxtecan, other medications, foods, dyes, or preservatives Pregnant or trying to get pregnant Breast-feeding How should I use this medication? This medication is injected into a vein. It is given by your care team in a hospital or clinic setting. A special MedGuide will be given to you before each treatment. Be sure to read this information carefully each time. Talk to your care team about the use of this medication in children. Special care may be needed. Overdosage: If you think you have taken too much of this medicine contact a poison control center or emergency room at once. NOTE: This medicine is only for you. Do not share this medicine with others. What if I miss a dose? It is important not to miss your dose. Call your care team if you are unable to keep an appointment. What may interact with this medication? Interactions are not expected. This list may not describe all possible interactions. Give your health care provider a list of all the medicines, herbs, non-prescription drugs, or dietary supplements you use. Also tell them if you smoke, drink alcohol, or use illegal drugs. Some items may interact with your  medicine. What should I watch for while using this medication? Visit your care team for regular checks on your progress. Tell your care team if your symptoms do not start to get better or if they get worse. Your condition will be monitored carefully while you are receiving this medication. Do not become pregnant while taking this medication or for 7 months after stopping it. Women should inform their care team if they wish to become pregnant or think they might be pregnant. Men should not father a child while taking this medication and for 4 months after stopping it. There is potential for serious side effects to an unborn child. Talk to your care team for more information. Do not breast-feed an infant while taking this medication or for 7 months after the last dose. This medication has caused decreased sperm counts in some men. This may make it more difficult to father a child. Talk to your care team if you are concerned about your fertility. This medication may increase your risk to bruise or bleed. Call your care team if you notice any unusual bleeding. Be careful brushing or flossing your teeth or using a toothpick because you may get an infection or bleed more easily. If you have any dental work done, tell your dentist you are receiving this medication. This medication may cause dry eyes and blurred vision. If you wear contact lenses, you may feel some discomfort. Lubricating eye drops may help. See your care team if the problem does not go away or is severe. This medication may increase your risk of getting an infection. Call your care team for  advice if you get a fever, chills, sore throat, or other symptoms of a cold or flu. Do not treat yourself. Try to avoid being around people who are sick. Avoid taking medications that contain aspirin, acetaminophen, ibuprofen, naproxen, or ketoprofen unless instructed by your care team. These medications may hide a fever. What side effects may I notice from  receiving this medication? Side effects that you should report to your care team as soon as possible: Allergic reactions--skin rash, itching, hives, swelling of the face, lips, tongue, or throat Dry cough, shortness of breath or trouble breathing Infection--fever, chills, cough, sore throat, wounds that don't heal, pain or trouble when passing urine, general feeling of discomfort or being unwell Heart failure--shortness of breath, swelling of the ankles, feet, or hands, sudden weight gain, unusual weakness or fatigue Unusual bruising or bleeding Side effects that usually do not require medical attention (report these to your care team if they continue or are bothersome): Constipation Diarrhea Hair loss Muscle pain Nausea Vomiting This list may not describe all possible side effects. Call your doctor for medical advice about side effects. You may report side effects to FDA at 1-800-FDA-1088. Where should I keep my medication? This medication is given in a hospital or clinic. It will not be stored at home. NOTE: This sheet is a summary. It may not cover all possible information. If you have questions about this medicine, talk to your doctor, pharmacist, or health care provider.  2024 [employer] (2021-02-10 00:00:00)

## 2022-10-14 ENCOUNTER — Encounter: Payer: Self-pay | Admitting: Oncology

## 2022-10-20 NOTE — Progress Notes (Signed)
Patient Care Team: [name], MD as PCP - General (Family Medicine) Jasmin Beckwith, MD as Consulting Physician (Oncology)  Clinic Day: 10/21/22  Referring physician: [name], MD  ASSESSMENT & PLAN:  Assessment & Plan: Malignant neoplasm of upper-outer quadrant of left breast in female, estrogen receptor negative ([name]) Stage IIB (T2c N1 M0) HER2 receptor positive left breast cancer, diagnosed in June 2020. This was ER/PR negative. This was treated with neoadjuvant Kadcyla/Perjeta and left mastectomy, with a complete response in breast and nodes. She had only received two doses of adjuvant Herceptin before being discontinued in December 2020.   History of colon cancer, stage II History of stage II colon cancer, diagnosed in May 2007. This was treated with surgical resection and adjuvant chemotherapy  with Xeloda. Colonoscopy from 2013 revealed 2 polyps which were resected and chronic diverticulosis.   Malignant neoplasm metastatic to liver [facility]) Liver metastases discovered 04/17/21.  Numerous heterogeneously enhancing, internally necrotic lesions in the right hepatic lobe, consistent with metastatic disease. Largest lesion measures up to 6.7 cm and there are at least 6 lesions. We now have biopsy proven HER2 positive recurrent breast cancer with ER/PR negative. She received THP and tolerated it without significant difficulty.  CT scan showed excellent response with significant decrease in the size of her liver lesions. She later had progression of this disease and was switched to Enhertu. Her CT of chest, abdomen, and pelvis on 07/27/2022 looked good with stable liver lesions which are subtle and at least 1 lesion is no longer visualized.   Malignant neoplasm metastatic to bone [facility]) Bone metastases discovered 04/17/21. Enhancing osseous lesions are present in the L2 vertebral body, both iliac bones, and in the sacrum, highly concerning for osseous metastases. She received her 1st  dose of zoledronic acid on January 16th, 2023.  I once again today explained to her and her son the reasoning for this medicine.  Her widespread bone metastases show an interval increase in sclerosis of previously lytic lesions consistent with treatment response. She still has extensive sclerotic metastasis of the bone but these are stable with stable compression of T6 and L2.    Chest wall recurrence of breast cancer, left ([name]) Left supraclavicular lymph node measuring 2-3 cm. Also involvement of subpectoral node and chest wall skin. This was rapidly progressive despite receiving  trastuzumab.  We have added in Perjeta and Taxotere to her current regimen and this has been very effective.  The CT scan also shows good response of her adenopathy with resolution of the previous bulky left axillary and subpectoral adenopathy. In September of 2023, she had a 2 cm nodule  located close to the left shoulder associated with nodularity of the left upper chest wall and appeared to have obvious progression of disease again. She was changed to [employer] and is responding well. The nodules have completely resolved.    Lymphedema She does have lymphedema of the left arm; however, this is better today, but with significant edema of the hand as well. We will refer her to a lymphedema specialist and she requests one in [place], so we will arrange that. She sleeps with the hand and arm elevated.  She has decreased use of her left hand but I think this is due to more than just the lymphedema.She has seen the neurologist for the nerve conduction velocity test on her hand in March.    Plan: Her CT of chest, abdomen, and pelvis on 07/27/2022 looked good. She had her echocardiogram  done today. She has been on Enhertu since September, 2023. Her Day 1 cycle 14 is scheduled on 10/26/2022. She is tolerating the Enhertu well and we will continue that treatment ever 3 weeks.  Her WBC is 4.7, hemoglobin is 12.5 and platelet count is  142,000 as of today and her CMP is normal. I will see her in 3 weeks with CBC and CMP for her next cycle 15. The patient and her son understand the plans discussed today and are in agreement with them.  She knows to contact our office if she develops concerns prior to her next appointment.  I provided 16 minutes of face-to-face time during this this encounter and > 50% was spent counseling as documented under my assessment and plan.    Jasmin Beckwith, MD  [facility] AT [facility] [address] [place] [address] Dept: [phone] Dept Fax: [phone]   CHIEF COMPLAINT:  CC: An 89 year old female with metastatic breast cancer   Current Treatment:  Planning Enhertu  INTERVAL HISTORY:  Jasmin Lewis is here today for repeat clinical assessment for metastatic breast cancer. She has been on Enhertu since September of 2023. Patient states that she feels good and has no complaint of pain today. She had her echocardiogram done today. Her Day 1 cycle 14 is scheduled on 10/26/2022. She is tolerating the Enhertu well and we will continue that treatment ever 3 weeks. She has been on zoledronic acid since January of 2023, every 6 weeks.  Her WBC is 4.7, hemoglobin is 12.5 and platelet count is 142,000 as of today and her CMP is normal. I will see her in 3 weeks with CBC and CMP for her next cycle 15. She denies signs of infection such as sore throat, sinus drainage, cough, or urinary symptoms.  She denies fevers or recurrent chills. She denies pain. She denies nausea, vomiting, chest pain, dyspnea or cough. Her appetite is a little better and her weight has increased 1 pounds over last 2 weeks . This patient is accompanied in the office by her  son as usual.   I have reviewed the past medical history, past surgical history, social history and family history with the patient and they are unchanged from previous note.  ALLERGIES:  has No Known  Allergies.  MEDICATIONS:  Current Outpatient Medications  Medication Sig Dispense Refill   Biotin 1 MG CAPS Take by mouth.     fish oil-omega-3 fatty acids 1000 MG capsule Take 1 g by mouth daily.     gabapentin (NEURONTIN) 100 MG capsule Take 1 capsule (100 mg total) by mouth at bedtime. (Patient not taking: Reported on 03/05/2022) 30 capsule 5   magnesium citrate SOLN Take 0.5 Bottles by mouth as needed for severe constipation.     Multiple Vitamin (MULTIVITAMIN) capsule Take 1 capsule by mouth daily.     ondansetron (ZOFRAN) 4 MG tablet Take 1 tablet (4 mg total) by mouth every 8 (eight) hours as needed for nausea or vomiting. (Patient not taking: Reported on 03/05/2022) 20 tablet 2   polyethylene glycol (MIRALAX / GLYCOLAX) 17 g packet Take 17 g by mouth as needed. constipation     No current facility-administered medications for this visit.   HISTORY OF PRESENT ILLNESS:   Oncology History Overview Note  Cancer Staging Malignant neoplasm of upper-outer quadrant of left breast in female, estrogen receptor negative ([name]) Staging form: Breast, AJCC 8th Edition - Clinical stage from 10/11/2018: Stage IIB (cT2,  cN1, cM0, G3, ER-, PR-, HER2+) - Signed by Malachy Mood, MD on 11/02/2018 - Clinical: No stage assigned - Unsigned    History of colon cancer, stage II  09/2005 Initial Diagnosis   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 Findings were a 5.9 x 3.8 x 1.2 cm moderate to well-differentiated adenocarcinoma penetrating the muscular wall. Forty lymph nodes negative. No vascular or lymphatic invasion. Multiple diverticula with microabscess formation and inflammation. Carcinoma present in at least 1 diverticulum. Preop CEA 5.2 with lab normal range 0 to 2.5. Preop CT scan with no obvious additional pathology.    2007 -  Chemotherapy   She received oral Xeloda chemotherapy as an adjuvant. She  declined treatment on a clinical trial.    11/12/2011 Initial Diagnosis   [name], stage II   12/09/2011 Procedure   Followup colonoscopy done on 12/09/2011. She was found to have 2 polyps which were removed. Chronic diverticulosis.     Malignant neoplasm of upper-outer quadrant of left breast in female, estrogen receptor negative ([name])  09/26/2018 Mammogram   Mammogram/US of left breast 09/26/18 IMPRESSION:  1. 2.9cm irregular mass in the upper outer left breast corresponds to the palpable abnormality. This is highly suspicious for breast carcinoma.  2. Two adjacent abnormal left axillary  Lymph nodes suspicious for metastatic adenopathy. There is a third borderline abnormal left axillary LN with a cortex thickened to 4mm.  3. Benign right breast cyst. No evidence of right breast malignancy.    10/11/2018 Cancer Staging   Staging form: Breast, AJCC 8th Edition - Clinical stage from 10/11/2018: Stage IIB (cT2, cN1, cM0, G3, ER-, PR-, HER2+) - Signed by Malachy Mood, MD on 11/02/2018   10/11/2018 Initial Biopsy   Diagnosis 10/11/18 1. Breast, left, needle core biopsy, upper outer left 2 o'clock - INVASIVE DUCTAL CARCINOMA. - DUCTAL CARCINOMA IN SITU. - LYMPHOVASCULAR INVASION IS IDENTIFIED. - SEE COMMENT. 2. Lymph node, needle/core biopsy, left axilla - INVASIVE DUCTAL CARCINOMA. - SEE COMMENT.   10/11/2018 Receptors her2   The tumor cells are POSITIVE for Her2 (3+). Estrogen Receptor: 0%, NEGATIVE Progesterone Receptor: 0%, NEGATIVE Proliferation Marker Ki67: 70%   11/02/2018 Initial Diagnosis   Malignant neoplasm of upper-outer quadrant of left breast in female, estrogen receptor negative ([name])   11/15/2018 Breast MRI   MRI breast 11/15/18  IMPRESSION: 1. 3.3 centimeter mass in the LATERAL portion of the LEFT breast consistent with known malignancy. 2. There is significant non mass enhancement surrounding this mass and extending anteriorly into the nipple base, with largest  diameter in the anterior to posterior axis, measuring 7.2 centimeters. 3. If the patient would consider breast conservation, additional MR guided core biopsies are recommended. Consider biopsy of the inferior and anterior extent of the non mass enhancement to document extent of disease. 4. Three enlarged LEFT axillary lymph nodes. 5. RIGHT breast is negative.   11/15/2018 PET scan   PET 11/15/18 IMPRESSION: Hypermetabolic left breast lesion compatible with known primary. Hypermetabolic left axillary lymph nodes are consistent with metastatic disease.   No evidence for additional hypermetabolic metastatic involvement in the neck, chest, abdomen, or pelvis.   11/17/2018 - 03/01/2019 Chemotherapy   Neo-adjuvant Kadcyla and perjeta q3weeks for 6 cycles starting 11/17/18. Stopped before surgery    11/29/2018 Pathology Results   Diagnosis 1. Breast, left, needle core biopsy, inferior anterior (cylinder clip) - INVASIVE DUCTAL CARCINOMA. - DUCTAL CARCINOMA  IN SITU. - LYMPHOVASCULAR INVASION IS IDENTIFIED. - SEE COMMENT. 2. Breast, left, needle core biopsy, central posterior (barbell clip) - INVASIVE DUCTAL CARCINOMA. - LYMPHOVASCULAR INVASION IS IDENTIFIED.   02/12/2019 Breast MRI   IMPRESSION: 1. Complete resolution of previously identified enhancing mass and associated non mass enhancement within the left breast. This is consistent with excellent response to chemotherapy. No residual or suspicious findings are identified. 2. No MRI evidence of malignancy on the right. 3. Previously identified left axillary lymphadenopathy not definitively seen on today's study. However, this may be due to decreased field-of-view compared to prior study.     03/14/2019 Cancer Staging   Staging form: Breast, AJCC 8th Edition - Pathologic stage from 03/14/2019: pT0, pN0, cM0, GX, ER: Unknown, PR: Unknown, HER2: Not Assessed - Signed by Malachy Mood, MD on 03/28/2019   03/14/2019 Surgery   LEFT MASTECTOMY  WITH TARGETED LYMPH NODE DISSECTION and Left Axillary Sentinel Lymph  Node Biopsy by Dr Carolynne Edouard 03/14/19    03/14/2019 Pathology Results   FINAL MICROSCOPIC DIAGNOSIS:   A. LYMPH NODE, LEFT, SENTINEL, BIOPSY:  - There is no evidence of carcinoma in 1 of 1 lymph node (0/1).   B. BREAST, LEFT, MASTECTOMY:  - Benign breast parenchyma with treatment-related changes.  - There is no evidence of malignancy.  - See oncology table below.   C. LYMPH NODE, LEFT #1, SENTINEL, BIOPSY:  - There is no evidence of carcinoma in 1 of 1 lymph node (0/1).   D. LYMPH NODE, LEFT #2, SENTINEL, BIOPSY:  - There is no evidence of carcinoma in 1 of 1 lymph node (0/1).     04/04/2019 - 04/25/2019 Chemotherapy   Maintenance Herceptin injections every 3 weeks starting 04/03/19 to complete 1 year of treatment that was started in 11/2018. Stopped after 2nd dose as she will not be repeating Echos.    06/03/2021 - 06/24/2021 Chemotherapy   Patient is on Treatment Plan : BREAST Trastuzumab q21d X 11 Cycles     06/25/2021 - 01/01/2022 Chemotherapy   Patient is on Treatment Plan : BREAST Docetaxel + Trastuzumab + Pertuzumab (THP) q21d     06/25/2021 - 01/02/2022 Chemotherapy   Patient is on Treatment Plan : BREAST Docetaxel + Trastuzumab + Pertuzumab (THP) q21d     09/14/2021 Imaging   CT chest/abdomen/pelvis  IMPRESSION:  1. Interval resolution of previously seen bulky left axillary and  subpectoral lymphadenopathy. No persistently enlarged lymph nodes.  2. Multiple liver lesions are significantly diminished in size.  3. Widespread osseous metastatic disease, with an interval increase  in sclerosis of several previously lytic lesions.  4. Constellation of findings is consistent with treatment response  of metastatic disease  5. New, although age indeterminate pathologic wedge deformity of the  T6 vertebral body as well as an increased pathologic wedge deformity  of the L2 vertebral body.  6. Coronary artery disease.   Aortic Atherosclerosis (ICD10-I70.0).    01/26/2022 -  Chemotherapy   Patient is on Treatment Plan : BREAST METASTATIC Fam-Trastuzumab Deruxtecan-nxki (Enhertu) (5.4) q21d     Malignant neoplasm metastatic to liver ([name])  04/17/2021 Imaging   CT ABDOMEN AND PELVIS WITH CONTRAST: -New lytic lesion involving the L2 vertebral body (6:72, 2:26) with minimal cortical breakthrough superiorly, but with preservation of vertebral body height, concerning for metastatic disease.  -There are several indeterminate hepatic lesions as described above. In addition to the lesions described above, there is an additional subtle 1.5 cm hypodensity in the hepatic dome (2:7). Given clinical history  and presence of a new L2 lesion, leading differential consideration is metastatic disease. Recommend further evaluation with MRI of the abdomen with and without contrast.   05/12/2021 Imaging   MRI ABDOMEN WITH AND WITHOUT CONTRAST: Numerous heterogeneously enhancing, internally necrotic lesions in the right hepatic lobe, highly concerning for metastatic disease. Largest lesion measures up to 6.7 cm in hepatic segment VI.   Enhancing osseous lesions in the L2 vertebral body, both iliac bones, and in the sacrum, highly concerning for osseous metastases.   05/15/2021 Initial Diagnosis   Liver metastases ([name])   05/27/2021 PET scan   1. Widespread recurrent/metastatic disease in this patient who is  status post bilateral mastectomy. Left chest wall recurrence with  left axillary/subpectoral, supraclavicular nodal metastasis.  2. Hepatic, left adrenal, and widespread osseous metastasis.  3. Incidental findings, including: Right nephrolithiasis. Tiny  hiatal hernia.    05/27/2021 Imaging   MRI LUMBAR AND THORACIC SPINE: Thoracic spine:  Osseous metastatic disease involving each level. The most dramatic deposits are at T4 and T6 where there is extraosseous/epidural tumor. No cord compression but there is foraminal  impingement on the left at T6-7 and on the right at T3-4. Early dorsal epidural tumor at the level of T10 and T3.   Lumbar spine:  1. Widespread osseous metastatic disease with largest deposit  replacing the L2 body where there is a compression fracture with  mild height loss. Also at this level is early epidural tumor  extension likely affecting both L2-3 foramina.  2. 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    05/27/2021 Imaging   MRI HEAD WITH AND WITHOUT CONTRAST: Negative for metastatic disease to the brain or calvarium.   06/03/2021 - 06/24/2021 Chemotherapy   Patient is on Treatment Plan : BREAST Trastuzumab q21d X 11 Cycles     06/25/2021 - 01/01/2022 Chemotherapy   Patient is on Treatment Plan : BREAST Docetaxel + Trastuzumab + Pertuzumab (THP) q21d     06/25/2021 - 01/02/2022 Chemotherapy   Patient is on Treatment Plan : BREAST Docetaxel + Trastuzumab + Pertuzumab (THP) q21d     09/14/2021 Imaging   CT chest/abdomen/pelvis  IMPRESSION:  1. Interval resolution of previously seen bulky left axillary and  subpectoral lymphadenopathy. No persistently enlarged lymph nodes.  2. Multiple liver lesions are significantly diminished in size.  3. Widespread osseous metastatic disease, with an interval increase  in sclerosis of several previously lytic lesions.  4. Constellation of findings is consistent with treatment response  of metastatic disease  5. New, although age indeterminate pathologic wedge deformity of the  T6 vertebral body as well as an increased pathologic wedge deformity  of the L2 vertebral body.  6. Coronary artery disease.  Aortic Atherosclerosis (ICD10-I70.0).    01/26/2022 -  Chemotherapy   Patient is on Treatment Plan : BREAST METASTATIC Fam-Trastuzumab Deruxtecan-nxki (Enhertu) (5.4) q21d     Malignant neoplasm metastatic to bone ([name])  04/17/2021 Imaging   CT ABDOMEN AND PELVIS WITH CONTRAST: -New lytic lesion involving the L2  vertebral body (6:72, 2:26) with minimal cortical breakthrough superiorly, but with preservation of vertebral body height, concerning for metastatic disease.  -There are several indeterminate hepatic lesions as described above. In addition to the lesions described above, there is an additional subtle 1.5 cm hypodensity in the hepatic dome (2:7). Given clinical history and presence of a new L2 lesion, leading differential consideration is metastatic disease. Recommend further evaluation with MRI of the abdomen with and without contrast.  05/12/2021 Imaging   MRI ABDOMEN WITH AND WITHOUT CONTRAST: Numerous heterogeneously enhancing, internally necrotic lesions in the right hepatic lobe, highly concerning for metastatic disease. Largest lesion measures up to 6.7 cm in hepatic segment VI.   Enhancing osseous lesions in the L2 vertebral body, both iliac bones, and in the sacrum, highly concerning for osseous metastases.   05/15/2021 Initial Diagnosis   Bone metastases ([name])   05/27/2021 PET scan   1. Widespread recurrent/metastatic disease in this patient who is  status post bilateral mastectomy. Left chest wall recurrence with  left axillary/subpectoral, supraclavicular nodal metastasis.  2. Hepatic, left adrenal, and widespread osseous metastasis.  3. Incidental findings, including: Right nephrolithiasis. Tiny  hiatal hernia.    05/27/2021 Imaging   MRI LUMBAR AND THORACIC SPINE: Thoracic spine:  Osseous metastatic disease involving each level. The most dramatic deposits are at T4 and T6 where there is extraosseous/epidural tumor. No cord compression but there is foraminal impingement on the left at T6-7 and on the right at T3-4. Early dorsal epidural tumor at the level of T10 and T3.   Lumbar spine:  1. Widespread osseous metastatic disease with largest deposit  replacing the L2 body where there is a compression fracture with  mild height loss. Also at this level is early epidural tumor   extension likely affecting both L2-3 foramina.  2. 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    05/27/2021 Imaging   MRI HEAD WITH AND WITHOUT CONTRAST: Negative for metastatic disease to the brain or calvarium.   06/03/2021 - 06/24/2021 Chemotherapy   Patient is on Treatment Plan : BREAST Trastuzumab q21d X 11 Cycles     06/25/2021 - 01/01/2022 Chemotherapy   Patient is on Treatment Plan : BREAST Docetaxel + Trastuzumab + Pertuzumab (THP) q21d     06/25/2021 - 01/02/2022 Chemotherapy   Patient is on Treatment Plan : BREAST Docetaxel + Trastuzumab + Pertuzumab (THP) q21d     09/14/2021 Imaging   CT chest/abdomen/pelvis  IMPRESSION:  1. Interval resolution of previously seen bulky left axillary and  subpectoral lymphadenopathy. No persistently enlarged lymph nodes.  2. Multiple liver lesions are significantly diminished in size.  3. Widespread osseous metastatic disease, with an interval increase  in sclerosis of several previously lytic lesions.  4. Constellation of findings is consistent with treatment response  of metastatic disease  5. New, although age indeterminate pathologic wedge deformity of the  T6 vertebral body as well as an increased pathologic wedge deformity  of the L2 vertebral body.  6. Coronary artery disease.  Aortic Atherosclerosis (ICD10-I70.0).    01/26/2022 -  Chemotherapy   Patient is on Treatment Plan : BREAST METASTATIC Fam-Trastuzumab Deruxtecan-nxki (Enhertu) (5.4) q21d       REVIEW OF SYSTEMS:   Review of Systems  Constitutional: Negative.  Negative for appetite change, chills, diaphoresis, fatigue, fever and unexpected weight change.  HENT:  Negative.  Negative for hearing loss, lump/mass, mouth sores, nosebleeds, sore throat, tinnitus, trouble swallowing and voice change.   Eyes:  Positive for eye problems (tearing of the eye). Negative for icterus.  Respiratory: Negative.  Negative for chest tightness, cough, hemoptysis,  shortness of breath and wheezing.   Cardiovascular: Negative.  Negative for chest pain, leg swelling and palpitations.  Gastrointestinal:  Negative for abdominal distention, abdominal pain, blood in stool, constipation (occasionally), diarrhea, nausea, rectal pain and vomiting.  Endocrine: Negative.   Genitourinary: Negative.  Negative for bladder incontinence, difficulty urinating, dyspareunia, dysuria, frequency, hematuria, menstrual problem,  nocturia, pelvic pain, vaginal bleeding and vaginal discharge.   Musculoskeletal:  Negative for arthralgias, back pain, flank pain, gait problem, myalgias, neck pain and neck stiffness.       Chronic left hand/nerve pain  Skin: Negative.  Negative for itching, rash and wound.  Neurological:  Positive for extremity weakness (of her left hand) and numbness (severe paresthesias inability to use her left hand). Negative for dizziness, gait problem, headaches, light-headedness, seizures and speech difficulty.  Hematological: Negative.  Negative for adenopathy. Does not bruise/bleed easily.  Psychiatric/Behavioral: Negative.  Negative for confusion, decreased concentration, depression, sleep disturbance and suicidal ideas. The patient is not nervous/anxious.    VITALS:  Blood pressure (!) 158/77, pulse 71, temperature 97.8 F (36.6 C), temperature source Oral, resp. rate 16, height 5' 0.25" (1.53 m), weight 110 lb 8 oz (50.1 kg), SpO2 96 %.  Wt Readings from Last 3 Encounters:  10/26/22 111 lb 0.6 oz (50.4 kg)  10/21/22 110 lb 8 oz (50.1 kg)  10/05/22 109 lb (49.4 kg)    Body mass index is 21.4 kg/m.  Performance status (ECOG): 1 - Symptomatic but completely ambulatory  PHYSICAL EXAM:  Physical Exam Vitals and nursing note reviewed. Exam conducted with a chaperone present.  Constitutional:      General: She is not in acute distress.    Appearance: Normal appearance. She is normal weight. She is not ill-appearing, toxic-appearing or diaphoretic.  HENT:      Head: Normocephalic and atraumatic.     Right Ear: Tympanic membrane, ear canal and external ear normal. There is no impacted cerumen.     Left Ear: Tympanic membrane, ear canal and external ear normal. There is no impacted cerumen.     Nose: Nose normal. No congestion or rhinorrhea.     Mouth/Throat:     Mouth: Mucous membranes are moist.     Pharynx: Oropharynx is clear. No oropharyngeal exudate or posterior oropharyngeal erythema.  Eyes:     General: No scleral icterus.       Right eye: No discharge.        Left eye: No discharge.     Extraocular Movements: Extraocular movements intact.     Conjunctiva/sclera: Conjunctivae normal.     Pupils: Pupils are equal, round, and reactive to light.  Neck:     Vascular: No carotid bruit.  Cardiovascular:     Rate and Rhythm: Normal rate and regular rhythm.     Pulses: Normal pulses.     Heart sounds: Normal heart sounds. No murmur heard.    No friction rub. No gallop.  Pulmonary:     Effort: Pulmonary effort is normal. No respiratory distress.     Breath sounds: Normal breath sounds. No stridor. No wheezing, rhonchi or rales.  Chest:     Chest wall: No tenderness.  Breasts:    Right: Absent.     Left: Absent.     Comments: Left mastectomy is negative. Right breast is without masses Abdominal:     General: Bowel sounds are normal. There is no distension.     Palpations: Abdomen is soft. There is no mass.     Tenderness: There is no abdominal tenderness. There is no right CVA tenderness, left CVA tenderness, guarding or rebound.     Hernia: No hernia is present.  Musculoskeletal:        General: No swelling, tenderness, deformity or signs of injury. Normal range of motion.     Left upper arm: Edema present.  Left forearm: Edema present.     Cervical back: Normal range of motion and neck supple. No rigidity or tenderness.     Right lower leg: No edema.     Left lower leg: No edema.     Comments: Mild lymphedema of the left  upper extremity  Lymphadenopathy:     Cervical: No cervical adenopathy.  Skin:    General: Skin is warm and dry.     Coloration: Skin is not jaundiced or pale.     Findings: No bruising, erythema (mild), lesion or rash.  Neurological:     Mental Status: She is alert and oriented to person, place, and time. Mental status is at baseline.     Cranial Nerves: No cranial nerve deficit.     Sensory: No sensory deficit.     Motor: Weakness (She has severe paresthesias of the left upper extremity with inability to use her left hand) present.     Coordination: Coordination normal.     Gait: Gait normal.     Deep Tendon Reflexes: Reflexes normal.  Psychiatric:        Mood and Affect: Mood normal.        Behavior: Behavior normal.        Thought Content: Thought content normal.        Judgment: Judgment normal.    LABORATORY DATA:  I have reviewed the data as listed    Component Value Date/Time   NA 140 10/21/2022 0000   NA 137 11/13/2012 1057   K 3.9 10/21/2022 0000   K 4.5 11/13/2012 1057   CL 108 10/21/2022 0000   CO2 24 (A) 10/21/2022 0000   CO2 26 11/13/2012 1057   GLUCOSE 101 (H) 09/30/2022 1357   GLUCOSE 90 11/13/2012 1057   BUN 23 (A) 10/21/2022 0000   BUN 18.3 11/13/2012 1057   CREATININE 0.6 10/21/2022 0000   CREATININE 0.76 09/30/2022 1357   CREATININE 0.8 11/13/2012 1057   CALCIUM 10.0 10/21/2022 0000   CALCIUM 9.7 11/13/2012 1057   PROT 6.6 09/30/2022 1357   PROT 7.4 11/13/2012 1057   ALBUMIN 3.9 10/21/2022 0000   ALBUMIN 3.9 11/13/2012 1057   AST 42 (A) 10/21/2022 0000   AST 36 09/30/2022 1357   AST 22 11/13/2012 1057   ALT 19 10/21/2022 0000   ALT 17 09/30/2022 1357   ALT 15 11/13/2012 1057   ALKPHOS 67 10/21/2022 0000   ALKPHOS 77 11/13/2012 1057   BILITOT 0.3 09/30/2022 1357   BILITOT 0.54 11/13/2012 1057   GFRNONAA >60 09/30/2022 1357   GFRNONAA >60 09/09/2022 0000   GFRAA >60 04/25/2019 1045   No results found for: "SPEP", "UPEP"  Lab Results   Component Value Date   WBC 4.7 10/21/2022   NEUTROABS 2.68 10/21/2022   HGB 12.5 10/21/2022   HCT 37 10/21/2022   MCV 100 (A) 04/15/2022   PLT 142 (A) 10/21/2022     Chemistry      Component Value Date/Time   NA 140 10/21/2022 0000   NA 137 11/13/2012 1057   K 3.9 10/21/2022 0000   K 4.5 11/13/2012 1057   CL 108 10/21/2022 0000   CO2 24 (A) 10/21/2022 0000   CO2 26 11/13/2012 1057   BUN 23 (A) 10/21/2022 0000   BUN 18.3 11/13/2012 1057   CREATININE 0.6 10/21/2022 0000   CREATININE 0.76 09/30/2022 1357   CREATININE 0.8 11/13/2012 1057   GLU 99 10/21/2022 0000      Component Value Date/Time  CALCIUM 10.0 10/21/2022 0000   CALCIUM 9.7 11/13/2012 1057   ALKPHOS 67 10/21/2022 0000   ALKPHOS 77 11/13/2012 1057   AST 42 (A) 10/21/2022 0000   AST 36 09/30/2022 1357   AST 22 11/13/2012 1057   ALT 19 10/21/2022 0000   ALT 17 09/30/2022 1357   ALT 15 11/13/2012 1057   BILITOT 0.3 09/30/2022 1357   BILITOT 0.54 11/13/2012 1057     RADIOGRAPHIC STUDIES:      I,Jasmine M Lassiter,acting as a scribe for Jasmin Beckwith, MD.,have documented all relevant documentation on the behalf of Jasmin Beckwith, MD,as directed by  Jasmin Beckwith, MD while in the presence of Jasmin Beckwith, MD.

## 2022-10-21 ENCOUNTER — Inpatient Hospital Stay: Payer: Medicare Other | Attending: Oncology

## 2022-10-21 ENCOUNTER — Encounter: Payer: Self-pay | Admitting: Oncology

## 2022-10-21 ENCOUNTER — Inpatient Hospital Stay (INDEPENDENT_AMBULATORY_CARE_PROVIDER_SITE_OTHER): Payer: Medicare Other | Admitting: Oncology

## 2022-10-21 VITALS — BP 158/77 | HR 71 | Temp 97.8°F | Resp 16 | Ht 60.25 in | Wt 110.5 lb

## 2022-10-21 DIAGNOSIS — C7951 Secondary malignant neoplasm of bone: Secondary | ICD-10-CM | POA: Diagnosis not present

## 2022-10-21 DIAGNOSIS — I272 Pulmonary hypertension, unspecified: Secondary | ICD-10-CM | POA: Diagnosis not present

## 2022-10-21 DIAGNOSIS — C50412 Malignant neoplasm of upper-outer quadrant of left female breast: Secondary | ICD-10-CM

## 2022-10-21 DIAGNOSIS — I890 Lymphedema, not elsewhere classified: Secondary | ICD-10-CM | POA: Insufficient documentation

## 2022-10-21 DIAGNOSIS — I361 Nonrheumatic tricuspid (valve) insufficiency: Secondary | ICD-10-CM | POA: Diagnosis not present

## 2022-10-21 DIAGNOSIS — I351 Nonrheumatic aortic (valve) insufficiency: Secondary | ICD-10-CM | POA: Diagnosis not present

## 2022-10-21 DIAGNOSIS — Z171 Estrogen receptor negative status [ER-]: Secondary | ICD-10-CM

## 2022-10-21 DIAGNOSIS — C787 Secondary malignant neoplasm of liver and intrahepatic bile duct: Secondary | ICD-10-CM | POA: Diagnosis not present

## 2022-10-21 DIAGNOSIS — Z9012 Acquired absence of left breast and nipple: Secondary | ICD-10-CM | POA: Insufficient documentation

## 2022-10-21 DIAGNOSIS — C7989 Secondary malignant neoplasm of other specified sites: Secondary | ICD-10-CM

## 2022-10-21 DIAGNOSIS — Z5112 Encounter for antineoplastic immunotherapy: Secondary | ICD-10-CM | POA: Insufficient documentation

## 2022-10-21 DIAGNOSIS — C50912 Malignant neoplasm of unspecified site of left female breast: Secondary | ICD-10-CM

## 2022-10-21 DIAGNOSIS — Z85038 Personal history of other malignant neoplasm of large intestine: Secondary | ICD-10-CM | POA: Insufficient documentation

## 2022-10-21 LAB — CBC AND DIFFERENTIAL
HCT: 37 (ref 36–46)
Hemoglobin: 12.5 (ref 12.0–16.0)
Neutrophils Absolute: 2.68
Platelets: 142 10*3/uL — AB (ref 150–400)
WBC: 4.7

## 2022-10-21 LAB — COMPREHENSIVE METABOLIC PANEL
Albumin: 3.9 (ref 3.5–5.0)
Calcium: 10 (ref 8.7–10.7)

## 2022-10-21 LAB — HEPATIC FUNCTION PANEL
ALT: 19 U/L (ref 7–35)
AST: 42 — AB (ref 13–35)
Alkaline Phosphatase: 67 (ref 25–125)
Bilirubin, Total: 0.7

## 2022-10-21 LAB — BASIC METABOLIC PANEL
BUN: 23 — AB (ref 4–21)
CO2: 24 — AB (ref 13–22)
Chloride: 108 (ref 99–108)
Creatinine: 0.6 (ref 0.5–1.1)
Glucose: 99
Potassium: 3.9 mEq/L (ref 3.5–5.1)
Sodium: 140 (ref 137–147)

## 2022-10-21 LAB — CBC: RBC: 3.52 — AB (ref 3.87–5.11)

## 2022-10-25 MED FILL — Dexamethasone Sodium Phosphate Inj 100 MG/10ML: INTRAMUSCULAR | Qty: 1 | Status: AC

## 2022-10-25 MED FILL — Zoledronic Acid Inj Conc For IV Infusion 4 MG/5ML: INTRAVENOUS | Qty: 3.75 | Status: AC

## 2022-10-26 ENCOUNTER — Inpatient Hospital Stay: Payer: Medicare Other

## 2022-10-26 ENCOUNTER — Other Ambulatory Visit: Payer: Self-pay | Admitting: Pharmacist

## 2022-10-26 VITALS — BP 161/71 | HR 74 | Temp 98.0°F | Resp 18 | Ht 60.25 in | Wt 111.0 lb

## 2022-10-26 DIAGNOSIS — Z171 Estrogen receptor negative status [ER-]: Secondary | ICD-10-CM

## 2022-10-26 DIAGNOSIS — C787 Secondary malignant neoplasm of liver and intrahepatic bile duct: Secondary | ICD-10-CM | POA: Diagnosis present

## 2022-10-26 DIAGNOSIS — C7951 Secondary malignant neoplasm of bone: Secondary | ICD-10-CM

## 2022-10-26 DIAGNOSIS — Z5112 Encounter for antineoplastic immunotherapy: Secondary | ICD-10-CM | POA: Diagnosis present

## 2022-10-26 DIAGNOSIS — C50412 Malignant neoplasm of upper-outer quadrant of left female breast: Secondary | ICD-10-CM | POA: Diagnosis present

## 2022-10-26 DIAGNOSIS — Z85038 Personal history of other malignant neoplasm of large intestine: Secondary | ICD-10-CM | POA: Diagnosis not present

## 2022-10-26 DIAGNOSIS — Z9012 Acquired absence of left breast and nipple: Secondary | ICD-10-CM | POA: Diagnosis not present

## 2022-10-26 DIAGNOSIS — I890 Lymphedema, not elsewhere classified: Secondary | ICD-10-CM | POA: Diagnosis not present

## 2022-10-26 MED ORDER — PALONOSETRON HCL INJECTION 0.25 MG/5ML
0.2500 mg | Freq: Once | INTRAVENOUS | Status: AC
  Administered 2022-10-26: 0.25 mg via INTRAVENOUS
  Filled 2022-10-26: qty 5

## 2022-10-26 MED ORDER — SODIUM CHLORIDE 0.9% FLUSH
10.0000 mL | INTRAVENOUS | Status: DC | PRN
  Administered 2022-10-26: 10 mL

## 2022-10-26 MED ORDER — SODIUM CHLORIDE 0.9 % IV SOLN
10.0000 mg | Freq: Once | INTRAVENOUS | Status: AC
  Administered 2022-10-26: 10 mg via INTRAVENOUS
  Filled 2022-10-26: qty 10

## 2022-10-26 MED ORDER — SODIUM CHLORIDE 0.9 % IV SOLN: Freq: Once | INTRAVENOUS | Status: AC

## 2022-10-26 MED ORDER — HEPARIN SOD (PORK) LOCK FLUSH 100 UNIT/ML IV SOLN
500.0000 [IU] | Freq: Once | INTRAVENOUS | Status: AC | PRN
  Administered 2022-10-26: 500 [IU]

## 2022-10-26 MED ORDER — FAM-TRASTUZUMAB DERUXTECAN-NXKI CHEMO 100 MG IV SOLR
4.0500 mg/kg | Freq: Once | INTRAVENOUS | Status: AC
  Administered 2022-10-26: 200 mg via INTRAVENOUS
  Filled 2022-10-26: qty 10

## 2022-10-26 MED ORDER — ZOLEDRONIC ACID 4 MG/5ML IV CONC
3.0000 mg | Freq: Once | INTRAVENOUS | Status: AC
  Administered 2022-10-26: 3 mg via INTRAVENOUS
  Filled 2022-10-26: qty 3.75

## 2022-10-26 MED ORDER — DIPHENHYDRAMINE HCL 25 MG PO CAPS
25.0000 mg | ORAL_CAPSULE | Freq: Once | ORAL | Status: AC
  Administered 2022-10-26: 25 mg via ORAL
  Filled 2022-10-26: qty 1

## 2022-10-26 MED ORDER — DEXTROSE 5 % IV SOLN: Freq: Once | INTRAVENOUS | Status: AC

## 2022-10-26 MED ORDER — ACETAMINOPHEN 325 MG PO TABS
650.0000 mg | ORAL_TABLET | Freq: Once | ORAL | Status: AC
  Administered 2022-10-26: 650 mg via ORAL
  Filled 2022-10-26: qty 2

## 2022-10-26 NOTE — Patient Instructions (Signed)
Fam-Trastuzumab Deruxtecan Injection What is this medication? FAM-TRASTUZUMAB DERUXTECAN (fam-tras TOOZ eu mab DER ux TEE kan) treats some types of cancer. It works by blocking a protein that causes cancer cells to grow and multiply. This helps to slow or stop the spread of cancer cells. This medicine may be used for other purposes; ask your health care provider or pharmacist if you have questions. COMMON BRAND NAME(S): ENHERTU What should I tell my care team before I take this medication? They need to know if you have any of these conditions: Heart disease Heart failure Infection, especially a viral infection, such as chickenpox, cold sores, or herpes Liver disease Lung or breathing disease, such as asthma or COPD An unusual or allergic reaction to fam-trastuzumab deruxtecan, other medications, foods, dyes, or preservatives Pregnant or trying to get pregnant Breast-feeding How should I use this medication? This medication is injected into a vein. It is given by your care team in a hospital or clinic setting. A special MedGuide will be given to you before each treatment. Be sure to read this information carefully each time. Talk to your care team about the use of this medication in children. Special care may be needed. Overdosage: If you think you have taken too much of this medicine contact a poison control center or emergency room at once. NOTE: This medicine is only for you. Do not share this medicine with others. What if I miss a dose? It is important not to miss your dose. Call your care team if you are unable to keep an appointment. What may interact with this medication? Interactions are not expected. This list may not describe all possible interactions. Give your health care provider a list of all the medicines, herbs, non-prescription drugs, or dietary supplements you use. Also tell them if you smoke, drink alcohol, or use illegal drugs. Some items may interact with your  medicine. What should I watch for while using this medication? Visit your care team for regular checks on your progress. Tell your care team if your symptoms do not start to get better or if they get worse. Your condition will be monitored carefully while you are receiving this medication. Do not become pregnant while taking this medication or for 7 months after stopping it. Women should inform their care team if they wish to become pregnant or think they might be pregnant. Men should not father a child while taking this medication and for 4 months after stopping it. There is potential for serious side effects to an unborn child. Talk to your care team for more information. Do not breast-feed an infant while taking this medication or for 7 months after the last dose. This medication has caused decreased sperm counts in some men. This may make it more difficult to father a child. Talk to your care team if you are concerned about your fertility. This medication may increase your risk to bruise or bleed. Call your care team if you notice any unusual bleeding. Be careful brushing or flossing your teeth or using a toothpick because you may get an infection or bleed more easily. If you have any dental work done, tell your dentist you are receiving this medication. This medication may cause dry eyes and blurred vision. If you wear contact lenses, you may feel some discomfort. Lubricating eye drops may help. See your care team if the problem does not go away or is severe. This medication may increase your risk of getting an infection. Call your care team for   advice if you get a fever, chills, sore throat, or other symptoms of a cold or flu. Do not treat yourself. Try to avoid being around people who are sick. Avoid taking medications that contain aspirin, acetaminophen, ibuprofen, naproxen, or ketoprofen unless instructed by your care team. These medications may hide a fever. What side effects may I notice from  receiving this medication? Side effects that you should report to your care team as soon as possible: Allergic reactions--skin rash, itching, hives, swelling of the face, lips, tongue, or throat Dry cough, shortness of breath or trouble breathing Infection--fever, chills, cough, sore throat, wounds that don't heal, pain or trouble when passing urine, general feeling of discomfort or being unwell Heart failure--shortness of breath, swelling of the ankles, feet, or hands, sudden weight gain, unusual weakness or fatigue Unusual bruising or bleeding Side effects that usually do not require medical attention (report these to your care team if they continue or are bothersome): Constipation Diarrhea Hair loss Muscle pain Nausea Vomiting This list may not describe all possible side effects. Call your doctor for medical advice about side effects. You may report side effects to FDA at 1-800-FDA-1088. Where should I keep my medication? This medication is given in a hospital or clinic. It will not be stored at home. NOTE: This sheet is a summary. It may not cover all possible information. If you have questions about this medicine, talk to your doctor, pharmacist, or health care provider.  2024 [employer] (2021-02-10 00:00:00)  

## 2022-10-28 ENCOUNTER — Encounter: Payer: Self-pay | Admitting: *Deleted

## 2022-10-31 ENCOUNTER — Encounter: Payer: Self-pay | Admitting: Oncology

## 2022-11-05 ENCOUNTER — Telehealth: Payer: Self-pay

## 2022-11-05 NOTE — Telephone Encounter (Signed)
Attempted to contact patient. No answer. 

## 2022-11-05 NOTE — Progress Notes (Signed)
Patient Care Team: Jasmin Giovanni, MD as PCP - General (Family Medicine) Jasmin Beckwith, MD as Consulting Physician (Oncology)  Clinic Day: 11/12/22  Referring physician: [name], MD  ASSESSMENT & PLAN:  Assessment & Plan: Malignant neoplasm of upper-outer quadrant of left breast in female, estrogen receptor negative ([name]) Stage IIB (T2c N1 M0) HER2 receptor positive left breast cancer, diagnosed in June 2020. This was ER/PR negative. This was treated with neoadjuvant Kadcyla/Perjeta and left mastectomy, with a complete response in breast and nodes. She had only received two doses of adjuvant Herceptin before being discontinued in December 2020.   History of colon cancer, stage II History of stage II colon cancer, diagnosed in May 2007. This was treated with surgical resection and adjuvant chemotherapy  with Xeloda. Colonoscopy from 2013 revealed 2 polyps which were resected and chronic diverticulosis.   Malignant neoplasm metastatic to liver [facility]) Liver metastases discovered 04/17/21.  Numerous heterogeneously enhancing, internally necrotic lesions in the right hepatic lobe, consistent with metastatic disease. Largest lesion measures up to 6.7 cm and there are at least 6 lesions. We now have biopsy proven HER2 positive recurrent breast cancer with ER/PR negative. She received THP and tolerated it without significant difficulty.  CT scan showed excellent response with significant decrease in the size of her liver lesions. She later had progression of this disease and was switched to Enhertu. Her CT of chest, abdomen, and pelvis on 07/27/2022 looked good with stable liver lesions which are subtle and at least 1 lesion is no longer visualized.   Malignant neoplasm metastatic to bone [facility]) Bone metastases discovered 04/17/21. Enhancing osseous lesions are present in the L2 vertebral body, both iliac bones, and in the sacrum, highly concerning for osseous metastases. She received her 1st  dose of zoledronic acid on January 16th, 2023.  I once again today explained to her and her son the reasoning for this medicine.  Her widespread bone metastases show an interval increase in sclerosis of previously lytic lesions consistent with treatment response. She still has extensive sclerotic metastasis of the bone but these are stable with stable compression of T6 and L2.    Chest wall recurrence of breast cancer, left ([name]) Left supraclavicular lymph node measuring 2-3 cm. Also involvement of subpectoral node and chest wall skin. This was rapidly progressive despite receiving  trastuzumab.  We have added in Perjeta and Taxotere to her current regimen and this has been very effective.  The CT scan also shows good response of her adenopathy with resolution of the previous bulky left axillary and subpectoral adenopathy. In September of 2023, she had a 2 cm nodule  located close to the left shoulder associated with nodularity of the left upper chest wall and appeared to have obvious progression of disease again. She was changed to [facility] and is responding well. The nodules have completely resolved.    Lymphedema She does have lymphedema of the left arm; however, this is better today, but with significant edema of the hand as well. We will refer her to a lymphedema specialist and she requests one in [place], so we will arrange that. She sleeps with the hand and arm elevated.  She has decreased use of her left hand but I think this is due to more than just the lymphedema.She has seen the neurologist for the nerve conduction velocity test on her hand in March.    Plan: She has been on Enhertu since September of 2023 and is tolerating it well. She  has been on zoledronic acid since January of 2023, every 6 weeks. Her day 1 cycle 15 is scheduled on 11/16/2022. Her WBC is 5.3, hemoglobin at 12.7, and platelet count of 152,000 as of today. Her CMP is normal other than a elevated BUN of 23 and SGOT of 50. She  continue to take oral calcium supplements.  I will see her back in 3 weeks with CBC and CMP. The patient and her son understand the plans discussed today and are in agreement with them.  She knows to contact our office if she develops concerns prior to her next appointment.  I provided 17 minutes of face-to-face time during this this encounter and > 50% was spent counseling as documented under my assessment and plan.    Jasmin Beckwith, MD  [facility] [address] [place] [address] Dept: [phone] Dept Fax: [phone]   CHIEF COMPLAINT:  CC: An 89 year old female with metastatic breast cancer   Current Treatment:  Enhertu  INTERVAL HISTORY:  Jasmin Lewis is here today for repeat clinical assessment for metastatic breast cancer. She has been on Enhertu since September of 2023 and is tolerating it well. She has been on zoledronic acid since January of 2023, every 6 weeks. Patient states that she feels ok and has no complaints of pain but still has severe paraesthesias of the left upper extremity with inability to use her left hand. Her day 1 cycle 15 is scheduled on 11/16/2022. Her WBC is 5.3, hemoglobin at 12.7, and platelet count of 152,000 as of today. Her CMP is normal other than a elevated BUN of 23 and SGOT of 50. She continues to take oral calcium supplements.  I will see her back in 3 weeks with CBC and CMP. She denies signs of infection such as sore throat, sinus drainage, cough, or urinary symptoms.  She denies fevers or recurrent chills. She denies pain. She denies nausea, vomiting, chest pain, dyspnea or cough. Her appetite is good and her weight has decreased 3 pounds over last month .This patient is accompanied in the office by her  son as usual.    I have reviewed the past medical history, past surgical history, social history and family history with the patient and they are unchanged from previous  note.  ALLERGIES:  has No Known Allergies.  MEDICATIONS:  Current Outpatient Medications  Medication Sig Dispense Refill   Biotin 1 MG CAPS Take by mouth.     fish oil-omega-3 fatty acids 1000 MG capsule Take 1 g by mouth daily.     gabapentin (NEURONTIN) 100 MG capsule Take 1 capsule (100 mg total) by mouth at bedtime. (Patient not taking: Reported on 03/05/2022) 30 capsule 5   magnesium citrate SOLN Take 0.5 Bottles by mouth as needed for severe constipation.     Multiple Vitamin (MULTIVITAMIN) capsule Take 1 capsule by mouth daily.     ondansetron (ZOFRAN) 4 MG tablet Take 1 tablet (4 mg total) by mouth every 8 (eight) hours as needed for nausea or vomiting. (Patient not taking: Reported on 03/05/2022) 20 tablet 2   polyethylene glycol (MIRALAX / GLYCOLAX) 17 g packet Take 17 g by mouth as needed. constipation     No current facility-administered medications for this visit.   HISTORY OF PRESENT ILLNESS:   Oncology History Overview Note  Cancer Staging Malignant neoplasm of upper-outer quadrant of left breast in female, estrogen receptor negative ([name]) Staging form: Breast, AJCC 8th  Edition - Clinical stage from 10/11/2018: Stage IIB (cT2, cN1, cM0, G3, ER-, PR-, HER2+) - Signed by Malachy Mood, MD on 11/02/2018 - Clinical: No stage assigned - Unsigned    History of colon cancer, stage II  09/2005 Initial Diagnosis   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 Findings were a 5.9 x 3.8 x 1.2 cm moderate to well-differentiated adenocarcinoma penetrating the muscular wall. Forty lymph nodes negative. No vascular or lymphatic invasion. Multiple diverticula with microabscess formation and inflammation. Carcinoma present in at least 1 diverticulum. Preop CEA 5.2 with lab normal range 0 to 2.5. Preop CT scan with no obvious additional pathology.    2007 -  Chemotherapy   She received oral Xeloda  chemotherapy as an adjuvant. She declined treatment on a clinical trial.    11/12/2011 Initial Diagnosis   [name], stage II   12/09/2011 Procedure   Followup colonoscopy done on 12/09/2011. She was found to have 2 polyps which were removed. Chronic diverticulosis.     Malignant neoplasm of upper-outer quadrant of left breast in female, estrogen receptor negative ([name])  09/26/2018 Mammogram   Mammogram/US of left breast 09/26/18 IMPRESSION:  1. 2.9cm irregular mass in the upper outer left breast corresponds to the palpable abnormality. This is highly suspicious for breast carcinoma.  2. Two adjacent abnormal left axillary  Lymph nodes suspicious for metastatic adenopathy. There is a third borderline abnormal left axillary LN with a cortex thickened to 4mm.  3. Benign right breast cyst. No evidence of right breast malignancy.    10/11/2018 Cancer Staging   Staging form: Breast, AJCC 8th Edition - Clinical stage from 10/11/2018: Stage IIB (cT2, cN1, cM0, G3, ER-, PR-, HER2+) - Signed by Malachy Mood, MD on 11/02/2018   10/11/2018 Initial Biopsy   Diagnosis 10/11/18 1. Breast, left, needle core biopsy, upper outer left 2 o'clock - INVASIVE DUCTAL CARCINOMA. - DUCTAL CARCINOMA IN SITU. - LYMPHOVASCULAR INVASION IS IDENTIFIED. - SEE COMMENT. 2. Lymph node, needle/core biopsy, left axilla - INVASIVE DUCTAL CARCINOMA. - SEE COMMENT.   10/11/2018 Receptors her2   The tumor cells are POSITIVE for Her2 (3+). Estrogen Receptor: 0%, NEGATIVE Progesterone Receptor: 0%, NEGATIVE Proliferation Marker Ki67: 70%   11/02/2018 Initial Diagnosis   Malignant neoplasm of upper-outer quadrant of left breast in female, estrogen receptor negative ([name])   11/15/2018 Breast MRI   MRI breast 11/15/18  IMPRESSION: 1. 3.3 centimeter mass in the LATERAL portion of the LEFT breast consistent with known malignancy. 2. There is significant non mass enhancement surrounding this mass and extending anteriorly into the  nipple base, with largest diameter in the anterior to posterior axis, measuring 7.2 centimeters. 3. If the patient would consider breast conservation, additional MR guided core biopsies are recommended. Consider biopsy of the inferior and anterior extent of the non mass enhancement to document extent of disease. 4. Three enlarged LEFT axillary lymph nodes. 5. RIGHT breast is negative.   11/15/2018 PET scan   PET 11/15/18 IMPRESSION: Hypermetabolic left breast lesion compatible with known primary. Hypermetabolic left axillary lymph nodes are consistent with metastatic disease.   No evidence for additional hypermetabolic metastatic involvement in the neck, chest, abdomen, or pelvis.   11/17/2018 - 03/01/2019 Chemotherapy   Neo-adjuvant Kadcyla and perjeta q3weeks for 6 cycles starting 11/17/18. Stopped before surgery    11/29/2018 Pathology Results   Diagnosis 1. Breast, left, needle core biopsy, inferior anterior (  cylinder clip) - INVASIVE DUCTAL CARCINOMA. - DUCTAL CARCINOMA IN SITU. - LYMPHOVASCULAR INVASION IS IDENTIFIED. - SEE COMMENT. 2. Breast, left, needle core biopsy, central posterior (barbell clip) - INVASIVE DUCTAL CARCINOMA. - LYMPHOVASCULAR INVASION IS IDENTIFIED.   02/12/2019 Breast MRI   IMPRESSION: 1. Complete resolution of previously identified enhancing mass and associated non mass enhancement within the left breast. This is consistent with excellent response to chemotherapy. No residual or suspicious findings are identified. 2. No MRI evidence of malignancy on the right. 3. Previously identified left axillary lymphadenopathy not definitively seen on today's study. However, this may be due to decreased field-of-view compared to prior study.     03/14/2019 Cancer Staging   Staging form: Breast, AJCC 8th Edition - Pathologic stage from 03/14/2019: pT0, pN0, cM0, GX, ER: Unknown, PR: Unknown, HER2: Not Assessed - Signed by Malachy Mood, MD on 03/28/2019   03/14/2019  Surgery   LEFT MASTECTOMY WITH TARGETED LYMPH NODE DISSECTION and Left Axillary Sentinel Lymph  Node Biopsy by Dr Carolynne Edouard 03/14/19    03/14/2019 Pathology Results   FINAL MICROSCOPIC DIAGNOSIS:   A. LYMPH NODE, LEFT, SENTINEL, BIOPSY:  - There is no evidence of carcinoma in 1 of 1 lymph node (0/1).   B. BREAST, LEFT, MASTECTOMY:  - Benign breast parenchyma with treatment-related changes.  - There is no evidence of malignancy.  - See oncology table below.   C. LYMPH NODE, LEFT #1, SENTINEL, BIOPSY:  - There is no evidence of carcinoma in 1 of 1 lymph node (0/1).   D. LYMPH NODE, LEFT #2, SENTINEL, BIOPSY:  - There is no evidence of carcinoma in 1 of 1 lymph node (0/1).     04/04/2019 - 04/25/2019 Chemotherapy   Maintenance Herceptin injections every 3 weeks starting 04/03/19 to complete 1 year of treatment that was started in 11/2018. Stopped after 2nd dose as she will not be repeating Echos.    06/03/2021 - 06/24/2021 Chemotherapy   Patient is on Treatment Plan : BREAST Trastuzumab q21d X 11 Cycles     06/25/2021 - 01/01/2022 Chemotherapy   Patient is on Treatment Plan : BREAST Docetaxel + Trastuzumab + Pertuzumab (THP) q21d     06/25/2021 - 01/02/2022 Chemotherapy   Patient is on Treatment Plan : BREAST Docetaxel + Trastuzumab + Pertuzumab (THP) q21d     09/14/2021 Imaging   CT chest/abdomen/pelvis  IMPRESSION:  1. Interval resolution of previously seen bulky left axillary and  subpectoral lymphadenopathy. No persistently enlarged lymph nodes.  2. Multiple liver lesions are significantly diminished in size.  3. Widespread osseous metastatic disease, with an interval increase  in sclerosis of several previously lytic lesions.  4. Constellation of findings is consistent with treatment response  of metastatic disease  5. New, although age indeterminate pathologic wedge deformity of the  T6 vertebral body as well as an increased pathologic wedge deformity  of the L2 vertebral body.  6.  Coronary artery disease.  Aortic Atherosclerosis (ICD10-I70.0).    01/26/2022 -  Chemotherapy   Patient is on Treatment Plan : BREAST METASTATIC Fam-Trastuzumab Deruxtecan-nxki (Enhertu) (5.4) q21d     Malignant neoplasm metastatic to liver ([name])  04/17/2021 Imaging   CT ABDOMEN AND PELVIS WITH CONTRAST: -New lytic lesion involving the L2 vertebral body (6:72, 2:26) with minimal cortical breakthrough superiorly, but with preservation of vertebral body height, concerning for metastatic disease.  -There are several indeterminate hepatic lesions as described above. In addition to the lesions described above, there is an additional subtle 1.5 cm  hypodensity in the hepatic dome (2:7). Given clinical history and presence of a new L2 lesion, leading differential consideration is metastatic disease. Recommend further evaluation with MRI of the abdomen with and without contrast.   05/12/2021 Imaging   MRI ABDOMEN WITH AND WITHOUT CONTRAST: Numerous heterogeneously enhancing, internally necrotic lesions in the right hepatic lobe, highly concerning for metastatic disease. Largest lesion measures up to 6.7 cm in hepatic segment VI.   Enhancing osseous lesions in the L2 vertebral body, both iliac bones, and in the sacrum, highly concerning for osseous metastases.   05/15/2021 Initial Diagnosis   Liver metastases ([name])   05/27/2021 PET scan   1. Widespread recurrent/metastatic disease in this patient who is  status post bilateral mastectomy. Left chest wall recurrence with  left axillary/subpectoral, supraclavicular nodal metastasis.  2. Hepatic, left adrenal, and widespread osseous metastasis.  3. Incidental findings, including: Right nephrolithiasis. Tiny  hiatal hernia.    05/27/2021 Imaging   MRI LUMBAR AND THORACIC SPINE: Thoracic spine:  Osseous metastatic disease involving each level. The most dramatic deposits are at T4 and T6 where there is extraosseous/epidural tumor. No cord compression but  there is foraminal impingement on the left at T6-7 and on the right at T3-4. Early dorsal epidural tumor at the level of T10 and T3.   Lumbar spine:  1. Widespread osseous metastatic disease with largest deposit  replacing the L2 body where there is a compression fracture with  mild height loss. Also at this level is early epidural tumor  extension likely affecting both L2-3 foramina.  2. 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    05/27/2021 Imaging   MRI HEAD WITH AND WITHOUT CONTRAST: Negative for metastatic disease to the brain or calvarium.   06/03/2021 - 06/24/2021 Chemotherapy   Patient is on Treatment Plan : BREAST Trastuzumab q21d X 11 Cycles     06/25/2021 - 01/01/2022 Chemotherapy   Patient is on Treatment Plan : BREAST Docetaxel + Trastuzumab + Pertuzumab (THP) q21d     06/25/2021 - 01/02/2022 Chemotherapy   Patient is on Treatment Plan : BREAST Docetaxel + Trastuzumab + Pertuzumab (THP) q21d     09/14/2021 Imaging   CT chest/abdomen/pelvis  IMPRESSION:  1. Interval resolution of previously seen bulky left axillary and  subpectoral lymphadenopathy. No persistently enlarged lymph nodes.  2. Multiple liver lesions are significantly diminished in size.  3. Widespread osseous metastatic disease, with an interval increase  in sclerosis of several previously lytic lesions.  4. Constellation of findings is consistent with treatment response  of metastatic disease  5. New, although age indeterminate pathologic wedge deformity of the  T6 vertebral body as well as an increased pathologic wedge deformity  of the L2 vertebral body.  6. Coronary artery disease.  Aortic Atherosclerosis (ICD10-I70.0).    01/26/2022 -  Chemotherapy   Patient is on Treatment Plan : BREAST METASTATIC Fam-Trastuzumab Deruxtecan-nxki (Enhertu) (5.4) q21d     Malignant neoplasm metastatic to bone ([name])  04/17/2021 Imaging   CT ABDOMEN AND PELVIS WITH CONTRAST: -New lytic lesion  involving the L2 vertebral body (6:72, 2:26) with minimal cortical breakthrough superiorly, but with preservation of vertebral body height, concerning for metastatic disease.  -There are several indeterminate hepatic lesions as described above. In addition to the lesions described above, there is an additional subtle 1.5 cm hypodensity in the hepatic dome (2:7). Given clinical history and presence of a new L2 lesion, leading differential consideration is metastatic disease. Recommend further evaluation with MRI  of the abdomen with and without contrast.   05/12/2021 Imaging   MRI ABDOMEN WITH AND WITHOUT CONTRAST: Numerous heterogeneously enhancing, internally necrotic lesions in the right hepatic lobe, highly concerning for metastatic disease. Largest lesion measures up to 6.7 cm in hepatic segment VI.   Enhancing osseous lesions in the L2 vertebral body, both iliac bones, and in the sacrum, highly concerning for osseous metastases.   05/15/2021 Initial Diagnosis   Bone metastases ([name])   05/27/2021 PET scan   1. Widespread recurrent/metastatic disease in this patient who is  status post bilateral mastectomy. Left chest wall recurrence with  left axillary/subpectoral, supraclavicular nodal metastasis.  2. Hepatic, left adrenal, and widespread osseous metastasis.  3. Incidental findings, including: Right nephrolithiasis. Tiny  hiatal hernia.    05/27/2021 Imaging   MRI LUMBAR AND THORACIC SPINE: Thoracic spine:  Osseous metastatic disease involving each level. The most dramatic deposits are at T4 and T6 where there is extraosseous/epidural tumor. No cord compression but there is foraminal impingement on the left at T6-7 and on the right at T3-4. Early dorsal epidural tumor at the level of T10 and T3.   Lumbar spine:  1. Widespread osseous metastatic disease with largest deposit  replacing the L2 body where there is a compression fracture with  mild height loss. Also at this level is early  epidural tumor  extension likely affecting both L2-3 foramina.  2. 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    05/27/2021 Imaging   MRI HEAD WITH AND WITHOUT CONTRAST: Negative for metastatic disease to the brain or calvarium.   06/03/2021 - 06/24/2021 Chemotherapy   Patient is on Treatment Plan : BREAST Trastuzumab q21d X 11 Cycles     06/25/2021 - 01/01/2022 Chemotherapy   Patient is on Treatment Plan : BREAST Docetaxel + Trastuzumab + Pertuzumab (THP) q21d     06/25/2021 - 01/02/2022 Chemotherapy   Patient is on Treatment Plan : BREAST Docetaxel + Trastuzumab + Pertuzumab (THP) q21d     09/14/2021 Imaging   CT chest/abdomen/pelvis  IMPRESSION:  1. Interval resolution of previously seen bulky left axillary and  subpectoral lymphadenopathy. No persistently enlarged lymph nodes.  2. Multiple liver lesions are significantly diminished in size.  3. Widespread osseous metastatic disease, with an interval increase  in sclerosis of several previously lytic lesions.  4. Constellation of findings is consistent with treatment response  of metastatic disease  5. New, although age indeterminate pathologic wedge deformity of the  T6 vertebral body as well as an increased pathologic wedge deformity  of the L2 vertebral body.  6. Coronary artery disease.  Aortic Atherosclerosis (ICD10-I70.0).    01/26/2022 -  Chemotherapy   Patient is on Treatment Plan : BREAST METASTATIC Fam-Trastuzumab Deruxtecan-nxki (Enhertu) (5.4) q21d       REVIEW OF SYSTEMS:   Review of Systems  Constitutional: Negative.  Negative for appetite change, chills, diaphoresis, fatigue, fever and unexpected weight change.  HENT:  Negative.  Negative for hearing loss, lump/mass, mouth sores, nosebleeds, sore throat, tinnitus, trouble swallowing and voice change.   Eyes:  Positive for eye problems (tearing of the eye). Negative for icterus.  Respiratory: Negative.  Negative for chest tightness,  cough, hemoptysis, shortness of breath and wheezing.   Cardiovascular: Negative.  Negative for chest pain, leg swelling and palpitations.  Gastrointestinal:  Negative for abdominal distention, abdominal pain, blood in stool, constipation, diarrhea, nausea, rectal pain and vomiting.  Endocrine: Negative.   Genitourinary: Negative.  Negative for bladder incontinence,  difficulty urinating, dyspareunia, dysuria, frequency, hematuria, menstrual problem, nocturia, pelvic pain, vaginal bleeding and vaginal discharge.   Musculoskeletal:  Negative for arthralgias, back pain, flank pain, gait problem, myalgias, neck pain and neck stiffness.       Chronic left hand/nerve pain  Skin: Negative.  Negative for itching, rash and wound.  Neurological:  Positive for extremity weakness (of her left hand) and numbness (severe paresthesias inability to use her left hand). Negative for dizziness, gait problem, headaches, light-headedness, seizures and speech difficulty.  Hematological: Negative.  Negative for adenopathy. Does not bruise/bleed easily.  Psychiatric/Behavioral: Negative.  Negative for confusion, decreased concentration, depression, sleep disturbance and suicidal ideas. The patient is not nervous/anxious.    VITALS:  Blood pressure (!) 153/68, pulse 78, temperature 98 F (36.7 C), temperature source Oral, resp. rate 18, height 5' 0.25" (1.53 m), weight 108 lb 8 oz (49.2 kg), SpO2 96%.  Wt Readings from Last 3 Encounters:  11/16/22 108 lb 12 oz (49.3 kg)  11/12/22 108 lb 8 oz (49.2 kg)  10/26/22 111 lb 0.6 oz (50.4 kg)    Body mass index is 21.01 kg/m.  Performance status (ECOG): 1 - Symptomatic but completely ambulatory  PHYSICAL EXAM:  Physical Exam Vitals and nursing note reviewed. Exam conducted with a chaperone present.  Constitutional:      General: She is not in acute distress.    Appearance: Normal appearance. She is normal weight. She is not ill-appearing, toxic-appearing or diaphoretic.   HENT:     Head: Normocephalic and atraumatic.     Right Ear: Tympanic membrane, ear canal and external ear normal. There is no impacted cerumen.     Left Ear: Tympanic membrane, ear canal and external ear normal. There is no impacted cerumen.     Nose: Nose normal. No congestion or rhinorrhea.     Mouth/Throat:     Mouth: Mucous membranes are moist.     Pharynx: Oropharynx is clear. No oropharyngeal exudate or posterior oropharyngeal erythema.  Eyes:     General: No scleral icterus.       Right eye: No discharge.        Left eye: No discharge.     Extraocular Movements: Extraocular movements intact.     Conjunctiva/sclera: Conjunctivae normal.     Pupils: Pupils are equal, round, and reactive to light.  Neck:     Vascular: No carotid bruit.  Cardiovascular:     Rate and Rhythm: Normal rate and regular rhythm.     Pulses: Normal pulses.     Heart sounds: Normal heart sounds. No murmur heard.    No friction rub. No gallop.  Pulmonary:     Effort: Pulmonary effort is normal. No respiratory distress.     Breath sounds: Normal breath sounds. No stridor. No wheezing, rhonchi or rales.  Chest:     Chest wall: No tenderness.  Breasts:    Right: Absent.     Left: Absent.     Comments: Left mastectomy is negative. Right breast is without masses Abdominal:     General: Bowel sounds are normal. There is no distension.     Palpations: Abdomen is soft. There is no mass.     Tenderness: There is no abdominal tenderness. There is no right CVA tenderness, left CVA tenderness, guarding or rebound.     Hernia: No hernia is present.  Musculoskeletal:        General: No swelling, tenderness, deformity or signs of injury. Normal range of motion.  Left upper arm: Edema present.     Left forearm: Edema present.     Cervical back: Normal range of motion and neck supple. No rigidity or tenderness.     Right lower leg: No edema.     Left lower leg: No edema.     Comments: Mild lymphedema of the  left upper extremity  Lymphadenopathy:     Cervical: No cervical adenopathy.  Skin:    General: Skin is warm and dry.     Coloration: Skin is not jaundiced or pale.     Findings: No bruising, erythema (mild), lesion or rash.  Neurological:     Mental Status: She is alert and oriented to person, place, and time. Mental status is at baseline.     Cranial Nerves: No cranial nerve deficit.     Sensory: No sensory deficit.     Motor: Weakness (She has severe paresthesias of the left upper extremity with inability to use her left hand) present.     Coordination: Coordination normal.     Gait: Gait normal.     Deep Tendon Reflexes: Reflexes normal.  Psychiatric:        Mood and Affect: Mood normal.        Behavior: Behavior normal.        Thought Content: Thought content normal.        Judgment: Judgment normal.    LABORATORY DATA:  I have reviewed the data as listed    Component Value Date/Time   NA 137 11/12/2022 1351   NA 137 11/13/2012 1057   K 4.4 11/12/2022 1351   K 4.5 11/13/2012 1057   CL 105 11/12/2022 1351   CO2 25 (A) 11/12/2022 1351   CO2 26 11/13/2012 1057   GLUCOSE 101 (H) 09/30/2022 1357   GLUCOSE 90 11/13/2012 1057   BUN 23 (A) 11/12/2022 1351   BUN 18.3 11/13/2012 1057   CREATININE 0.8 11/12/2022 1351   CREATININE 0.76 09/30/2022 1357   CREATININE 0.8 11/13/2012 1057   CALCIUM 9.7 11/12/2022 1351   CALCIUM 9.7 11/13/2012 1057   PROT 6.6 09/30/2022 1357   PROT 7.4 11/13/2012 1057   ALBUMIN 3.8 11/12/2022 1351   ALBUMIN 3.9 11/13/2012 1057   AST 50 (A) 11/12/2022 1351   AST 36 09/30/2022 1357   AST 22 11/13/2012 1057   ALT 20 11/12/2022 1351   ALT 17 09/30/2022 1357   ALT 15 11/13/2012 1057   ALKPHOS 55 11/12/2022 1351   ALKPHOS 77 11/13/2012 1057   BILITOT 0.3 09/30/2022 1357   BILITOT 0.54 11/13/2012 1057   GFRNONAA >60 09/30/2022 1357   GFRNONAA >60 09/09/2022 0000   GFRAA >60 04/25/2019 1045   No results found for: "SPEP", "UPEP"  Lab Results   Component Value Date   WBC 5.3 11/12/2022   NEUTROABS 2.81 11/12/2022   HGB 12.7 11/12/2022   HCT 37 11/12/2022   MCV 100 (A) 04/15/2022   PLT 152 11/12/2022     Chemistry      Component Value Date/Time   NA 137 11/12/2022 1351   NA 137 11/13/2012 1057   K 4.4 11/12/2022 1351   K 4.5 11/13/2012 1057   CL 105 11/12/2022 1351   CO2 25 (A) 11/12/2022 1351   CO2 26 11/13/2012 1057   BUN 23 (A) 11/12/2022 1351   BUN 18.3 11/13/2012 1057   CREATININE 0.8 11/12/2022 1351   CREATININE 0.76 09/30/2022 1357   CREATININE 0.8 11/13/2012 1057   GLU 111 11/12/2022 1351  Component Value Date/Time   CALCIUM 9.7 11/12/2022 1351   CALCIUM 9.7 11/13/2012 1057   ALKPHOS 55 11/12/2022 1351   ALKPHOS 77 11/13/2012 1057   AST 50 (A) 11/12/2022 1351   AST 36 09/30/2022 1357   AST 22 11/13/2012 1057   ALT 20 11/12/2022 1351   ALT 17 09/30/2022 1357   ALT 15 11/13/2012 1057   BILITOT 0.3 09/30/2022 1357   BILITOT 0.54 11/13/2012 1057     RADIOGRAPHIC STUDIES:      I,Jasmine M Lassiter,acting as a scribe for Jasmin Beckwith, MD.,have documented all relevant documentation on the behalf of Jasmin Beckwith, MD,as directed by  Jasmin Beckwith, MD while in the presence of Jasmin Beckwith, MD.

## 2022-11-05 NOTE — Telephone Encounter (Signed)
-----   Message from [name], MD sent at 10/29/2022  1:40 PM EDT ----- Regarding: call Tell her heart test looks great

## 2022-11-12 ENCOUNTER — Encounter: Payer: Self-pay | Admitting: Oncology

## 2022-11-12 ENCOUNTER — Inpatient Hospital Stay: Payer: Medicare Other | Admitting: Oncology

## 2022-11-12 ENCOUNTER — Inpatient Hospital Stay: Payer: Medicare Other | Attending: Oncology

## 2022-11-12 ENCOUNTER — Other Ambulatory Visit: Payer: Self-pay | Admitting: Oncology

## 2022-11-12 VITALS — BP 153/68 | HR 78 | Temp 98.0°F | Resp 18 | Ht 60.25 in | Wt 108.5 lb

## 2022-11-12 DIAGNOSIS — Z5112 Encounter for antineoplastic immunotherapy: Secondary | ICD-10-CM | POA: Insufficient documentation

## 2022-11-12 DIAGNOSIS — C50912 Malignant neoplasm of unspecified site of left female breast: Secondary | ICD-10-CM | POA: Diagnosis not present

## 2022-11-12 DIAGNOSIS — C7951 Secondary malignant neoplasm of bone: Secondary | ICD-10-CM

## 2022-11-12 DIAGNOSIS — C50412 Malignant neoplasm of upper-outer quadrant of left female breast: Secondary | ICD-10-CM | POA: Insufficient documentation

## 2022-11-12 DIAGNOSIS — Z9012 Acquired absence of left breast and nipple: Secondary | ICD-10-CM | POA: Insufficient documentation

## 2022-11-12 DIAGNOSIS — C787 Secondary malignant neoplasm of liver and intrahepatic bile duct: Secondary | ICD-10-CM

## 2022-11-12 DIAGNOSIS — Z171 Estrogen receptor negative status [ER-]: Secondary | ICD-10-CM | POA: Insufficient documentation

## 2022-11-12 DIAGNOSIS — I890 Lymphedema, not elsewhere classified: Secondary | ICD-10-CM

## 2022-11-12 DIAGNOSIS — C7989 Secondary malignant neoplasm of other specified sites: Secondary | ICD-10-CM

## 2022-11-12 DIAGNOSIS — Z85038 Personal history of other malignant neoplasm of large intestine: Secondary | ICD-10-CM | POA: Insufficient documentation

## 2022-11-12 LAB — COMPREHENSIVE METABOLIC PANEL
Albumin: 3.8 (ref 3.5–5.0)
Calcium: 9.7 (ref 8.7–10.7)

## 2022-11-12 LAB — CBC AND DIFFERENTIAL
HCT: 37 (ref 36–46)
Hemoglobin: 12.7 (ref 12.0–16.0)
Neutrophils Absolute: 2.81
Platelets: 152 10*3/uL (ref 150–400)
WBC: 5.3

## 2022-11-12 LAB — CBC: RBC: 3.51 — AB (ref 3.87–5.11)

## 2022-11-12 LAB — BASIC METABOLIC PANEL
BUN: 23 — AB (ref 4–21)
CO2: 25 — AB (ref 13–22)
Chloride: 105 (ref 99–108)
Creatinine: 0.8 (ref 0.5–1.1)
EGFR: 60
Glucose: 111
Potassium: 4.4 mEq/L (ref 3.5–5.1)
Sodium: 137 (ref 137–147)

## 2022-11-12 LAB — HEPATIC FUNCTION PANEL
ALT: 20 U/L (ref 7–35)
AST: 50 — AB (ref 13–35)
Alkaline Phosphatase: 55 (ref 25–125)
Bilirubin, Total: 0.6

## 2022-11-15 MED FILL — Dexamethasone Sodium Phosphate Inj 100 MG/10ML: INTRAMUSCULAR | Qty: 1 | Status: AC

## 2022-11-16 ENCOUNTER — Inpatient Hospital Stay: Payer: Medicare Other

## 2022-11-16 VITALS — BP 129/44 | HR 70 | Temp 97.7°F | Resp 18 | Ht 60.25 in | Wt 108.8 lb

## 2022-11-16 DIAGNOSIS — Z171 Estrogen receptor negative status [ER-]: Secondary | ICD-10-CM | POA: Diagnosis not present

## 2022-11-16 DIAGNOSIS — Z5112 Encounter for antineoplastic immunotherapy: Secondary | ICD-10-CM | POA: Diagnosis present

## 2022-11-16 DIAGNOSIS — C787 Secondary malignant neoplasm of liver and intrahepatic bile duct: Secondary | ICD-10-CM

## 2022-11-16 DIAGNOSIS — Z85038 Personal history of other malignant neoplasm of large intestine: Secondary | ICD-10-CM | POA: Diagnosis not present

## 2022-11-16 DIAGNOSIS — C7951 Secondary malignant neoplasm of bone: Secondary | ICD-10-CM

## 2022-11-16 DIAGNOSIS — C50412 Malignant neoplasm of upper-outer quadrant of left female breast: Secondary | ICD-10-CM | POA: Diagnosis present

## 2022-11-16 DIAGNOSIS — Z9012 Acquired absence of left breast and nipple: Secondary | ICD-10-CM | POA: Diagnosis not present

## 2022-11-16 DIAGNOSIS — I890 Lymphedema, not elsewhere classified: Secondary | ICD-10-CM | POA: Diagnosis not present

## 2022-11-16 MED ORDER — SODIUM CHLORIDE 0.9% FLUSH
10.0000 mL | INTRAVENOUS | Status: DC | PRN
  Administered 2022-11-16: 10 mL

## 2022-11-16 MED ORDER — ACETAMINOPHEN 325 MG PO TABS
650.0000 mg | ORAL_TABLET | Freq: Once | ORAL | Status: AC
  Administered 2022-11-16: 650 mg via ORAL
  Filled 2022-11-16: qty 2

## 2022-11-16 MED ORDER — DEXTROSE 5 % IV SOLN: Freq: Once | INTRAVENOUS | Status: AC

## 2022-11-16 MED ORDER — DIPHENHYDRAMINE HCL 25 MG PO CAPS
25.0000 mg | ORAL_CAPSULE | Freq: Once | ORAL | Status: AC
  Administered 2022-11-16: 25 mg via ORAL
  Filled 2022-11-16: qty 1

## 2022-11-16 MED ORDER — SODIUM CHLORIDE 0.9 % IV SOLN
10.0000 mg | Freq: Once | INTRAVENOUS | Status: AC
  Administered 2022-11-16: 10 mg via INTRAVENOUS
  Filled 2022-11-16: qty 10

## 2022-11-16 MED ORDER — FAM-TRASTUZUMAB DERUXTECAN-NXKI CHEMO 100 MG IV SOLR
4.0500 mg/kg | Freq: Once | INTRAVENOUS | Status: AC
  Administered 2022-11-16: 200 mg via INTRAVENOUS
  Filled 2022-11-16: qty 10

## 2022-11-16 MED ORDER — HEPARIN SOD (PORK) LOCK FLUSH 100 UNIT/ML IV SOLN
500.0000 [IU] | Freq: Once | INTRAVENOUS | Status: AC | PRN
  Administered 2022-11-16: 500 [IU]

## 2022-11-16 MED ORDER — PALONOSETRON HCL INJECTION 0.25 MG/5ML
0.2500 mg | Freq: Once | INTRAVENOUS | Status: AC
  Administered 2022-11-16: 0.25 mg via INTRAVENOUS
  Filled 2022-11-16: qty 5

## 2022-11-16 NOTE — Patient Instructions (Addendum)
Jasmin Lewis What is this medication? Jasmin DERUXTECAN (fam-tras TOOZ eu mab DER ux TEE kan) treats some types of cancer. It works by blocking a protein that causes cancer cells to grow and multiply. This helps to slow or stop the spread of cancer cells. This medicine may be used for other purposes; ask your health care provider or pharmacist if you have questions. COMMON BRAND NAME(S): ENHERTU What should I tell my care team before I take this medication? They need to know if you have any of these conditions: Heart disease Heart failure Infection, especially a viral infection, such as chickenpox, cold sores, or herpes Liver disease Lung or breathing disease, such as asthma or COPD An unusual or allergic reaction to Jasmin deruxtecan, other medications, foods, dyes, or preservatives Pregnant or trying to get pregnant Breast-feeding How should I use this medication? This medication is injected into a vein. It is given by your care team in a hospital or clinic setting. A special MedGuide will be given to you before each treatment. Be sure to read this information carefully each time. Talk to your care team about the use of this medication in children. Special care may be needed. Overdosage: If you think you have taken too much of this medicine contact a poison control center or emergency room at once. NOTE: This medicine is only for you. Do not share this medicine with others. What if I miss a dose? It is important not to miss your dose. Call your care team if you are unable to keep an appointment. What may interact with this medication? Interactions are not expected. This list may not describe all possible interactions. Give your health care provider a list of all the medicines, herbs, non-prescription drugs, or dietary supplements you use. Also tell them if you smoke, drink alcohol, or use illegal drugs. Some items may interact with your  medicine. What should I watch for while using this medication? Visit your care team for regular checks on your progress. Tell your care team if your symptoms do not start to get better or if they get worse. Your condition will be monitored carefully while you are receiving this medication. Do not become pregnant while taking this medication or for 7 months after stopping it. Women should inform their care team if they wish to become pregnant or think they might be pregnant. Men should not father a child while taking this medication and for 4 months after stopping it. There is potential for serious side effects to an unborn child. Talk to your care team for more information. Do not breast-feed an infant while taking this medication or for 7 months after the last dose. This medication has caused decreased sperm counts in some men. This may make it more difficult to father a child. Talk to your care team if you are concerned about your fertility. This medication may increase your risk to bruise or bleed. Call your care team if you notice any unusual bleeding. Be careful brushing or flossing your teeth or using a toothpick because you may get an infection or bleed more easily. If you have any dental work done, tell your dentist you are receiving this medication. This medication may cause dry eyes and blurred vision. If you wear contact lenses, you may feel some discomfort. Lubricating eye drops may help. See your care team if the problem does not go away or is severe. This medication may increase your risk of getting an infection. Call your care team for  advice if you get a fever, chills, sore throat, or other symptoms of a cold or flu. Do not treat yourself. Try to avoid being around people who are sick. Avoid taking medications that contain aspirin, acetaminophen, ibuprofen, naproxen, or ketoprofen unless instructed by your care team. These medications may hide a fever. What side effects may I notice from  receiving this medication? Side effects that you should report to your care team as soon as possible: Allergic reactions--skin rash, itching, hives, swelling of the face, lips, tongue, or throat Dry cough, shortness of breath or trouble breathing Infection--fever, chills, cough, sore throat, wounds that don't heal, pain or trouble when passing urine, general feeling of discomfort or being unwell Heart failure--shortness of breath, swelling of the ankles, feet, or hands, sudden weight gain, unusual weakness or fatigue Unusual bruising or bleeding Side effects that usually do not require medical attention (report these to your care team if they continue or are bothersome): Constipation Diarrhea Hair loss Muscle pain Nausea Vomiting This list may not describe all possible side effects. Call your doctor for medical advice about side effects. You may report side effects to FDA at 1-800-FDA-1088. Where should I keep my medication? This medication is given in a hospital or clinic. It will not be stored at home. NOTE: This sheet is a summary. It may not cover all possible information. If you have questions about this medicine, talk to your doctor, pharmacist, or health care provider.  2024 [employer]/Gold Standard (2021-02-10 00:00:00) [facility]  Discharge Instructions: Thank you for choosing [facility] to provide your oncology and hematology care.  If you have a lab appointment with the Cancer Center, please go directly to the Cancer Center and check in at the registration area.   Wear comfortable clothing and clothing appropriate for easy access to any Portacath or PICC line.   We strive to give you quality time with your provider. You may need to reschedule your appointment if you arrive late (15 or more minutes).  Arriving late affects you and other patients whose appointments are after yours.  Also, if you miss three or more appointments without  notifying the office, you may be dismissed from the clinic at the provider's discretion.      For prescription refill requests, have your pharmacy contact our office and allow 72 hours for refills to be completed.    Today you received the following chemotherapy and/or immunotherapy agents Jasmin-DERUXTECAN-NXKI      To help prevent nausea and vomiting after your treatment, we encourage you to take your nausea medication as directed.  BELOW ARE SYMPTOMS THAT SHOULD BE REPORTED IMMEDIATELY: *FEVER GREATER THAN 100.4 F (38 C) OR HIGHER *CHILLS OR SWEATING *NAUSEA AND VOMITING THAT IS NOT CONTROLLED WITH YOUR NAUSEA MEDICATION *UNUSUAL SHORTNESS OF BREATH *UNUSUAL BRUISING OR BLEEDING *URINARY PROBLEMS (pain or burning when urinating, or frequent urination) *BOWEL PROBLEMS (unusual diarrhea, constipation, pain near the anus) TENDERNESS IN MOUTH AND THROAT WITH OR WITHOUT PRESENCE OF ULCERS (sore throat, sores in mouth, or a toothache) UNUSUAL RASH, SWELLING OR PAIN  UNUSUAL VAGINAL DISCHARGE OR ITCHING   Items with * indicate a potential emergency and should be followed up as soon as possible or go to the Emergency Department if any problems should occur.  Please show the CHEMOTHERAPY ALERT CARD or IMMUNOTHERAPY ALERT CARD at check-in to the Emergency Department and triage nurse.  Should you have questions after your visit or need to cancel or reschedule  your appointment, please contact [facility]  Dept: [phone]  and follow the prompts.  Office hours are 8:00 a.m. to 4:30 p.m. Monday - Friday. Please note that voicemails left after 4:00 p.m. may not be returned until the following business day.  We are closed weekends and major holidays. You have access to a nurse at all times for urgent questions. Please call the main number to the clinic Dept: [phone] and follow the prompts.  For any non-urgent questions, you may also contact your provider using  MyChart. We now offer e-Visits for anyone 32 and older to request care online for non-urgent symptoms. For details visit [email or web address].   Also download the MyChart app! Go to the app store, search "MyChart", open the app, select [place], and log in with your MyChart username and password.

## 2022-11-17 ENCOUNTER — Other Ambulatory Visit: Payer: Self-pay

## 2022-11-22 ENCOUNTER — Other Ambulatory Visit: Payer: Self-pay

## 2022-11-22 DIAGNOSIS — C7951 Secondary malignant neoplasm of bone: Secondary | ICD-10-CM

## 2022-11-22 DIAGNOSIS — C50412 Malignant neoplasm of upper-outer quadrant of left female breast: Secondary | ICD-10-CM

## 2022-11-22 DIAGNOSIS — C787 Secondary malignant neoplasm of liver and intrahepatic bile duct: Secondary | ICD-10-CM

## 2022-11-26 ENCOUNTER — Encounter: Payer: Self-pay | Admitting: Oncology

## 2022-12-02 ENCOUNTER — Encounter: Payer: Self-pay | Admitting: Oncology

## 2022-12-02 ENCOUNTER — Inpatient Hospital Stay: Payer: Medicare Other

## 2022-12-02 ENCOUNTER — Inpatient Hospital Stay (INDEPENDENT_AMBULATORY_CARE_PROVIDER_SITE_OTHER): Payer: Medicare Other | Admitting: Oncology

## 2022-12-02 VITALS — BP 175/60 | HR 80 | Temp 98.0°F | Resp 18 | Ht 60.25 in | Wt 107.9 lb

## 2022-12-02 DIAGNOSIS — Z171 Estrogen receptor negative status [ER-]: Secondary | ICD-10-CM

## 2022-12-02 DIAGNOSIS — C787 Secondary malignant neoplasm of liver and intrahepatic bile duct: Secondary | ICD-10-CM | POA: Diagnosis not present

## 2022-12-02 DIAGNOSIS — I890 Lymphedema, not elsewhere classified: Secondary | ICD-10-CM

## 2022-12-02 DIAGNOSIS — C50412 Malignant neoplasm of upper-outer quadrant of left female breast: Secondary | ICD-10-CM | POA: Diagnosis not present

## 2022-12-02 DIAGNOSIS — C7951 Secondary malignant neoplasm of bone: Secondary | ICD-10-CM | POA: Diagnosis not present

## 2022-12-02 LAB — BASIC METABOLIC PANEL
BUN: 23 — AB (ref 4–21)
CO2: 20 (ref 13–22)
Chloride: 107 (ref 99–108)
Creatinine: 0.6 (ref 0.5–1.1)
Glucose: 86
Potassium: 4.1 mEq/L (ref 3.5–5.1)
Sodium: 139 (ref 137–147)

## 2022-12-02 LAB — CBC AND DIFFERENTIAL
HCT: 39 (ref 36–46)
Hemoglobin: 13.4 (ref 12.0–16.0)
Neutrophils Absolute: 3.13
Platelets: 155 10*3/uL (ref 150–400)
WBC: 5.8

## 2022-12-02 LAB — HEPATIC FUNCTION PANEL
ALT: 19 U/L (ref 7–35)
AST: 48 — AB (ref 13–35)
Alkaline Phosphatase: 65 (ref 25–125)
Bilirubin, Total: 0.9

## 2022-12-02 LAB — CBC: RBC: 3.76 — AB (ref 3.87–5.11)

## 2022-12-02 LAB — COMPREHENSIVE METABOLIC PANEL
Albumin: 4.3 (ref 3.5–5.0)
Calcium: 9.9 (ref 8.7–10.7)

## 2022-12-02 NOTE — Progress Notes (Signed)
Patient Care Team: [name], MD as PCP - General (Family Medicine) [name], MD as Consulting Physician (Oncology)  Clinic Day: 12/02/2022  Referring physician: [name], MD  ASSESSMENT & PLAN:  Assessment & Plan: Malignant neoplasm of upper-outer quadrant of left breast in female, estrogen receptor negative ([name]) Stage IIB (T2c N1 M0) HER2 receptor positive left breast cancer, diagnosed in June 2020. This was ER/PR negative. This was treated with neoadjuvant Kadcyla/Perjeta and left mastectomy, with a complete response in breast and nodes. She had only received two doses of adjuvant Herceptin before being discontinued in December 2020.   History of colon cancer, stage II History of stage II colon cancer, diagnosed in May 2007. This was treated with surgical resection and adjuvant chemotherapy  with Xeloda. Colonoscopy from 2013 revealed 2 polyps which were resected and chronic diverticulosis.   Malignant neoplasm metastatic to liver [name]) Liver metastases discovered 04/17/21.  Numerous heterogeneously enhancing, internally necrotic lesions in the right hepatic lobe, consistent with metastatic disease. Largest lesion measures up to 6.7 cm and there are at least 6 lesions. We now have biopsy proven HER2 positive recurrent breast cancer with ER/PR negative. She received THP and tolerated it without significant difficulty.  CT scan showed excellent response with significant decrease in the size of her liver lesions. She later had progression of this disease and was switched to Enhertu. Her CT of chest, abdomen, and pelvis on 07/27/2022 looked good with stable liver lesions which are subtle and at least 1 lesion is no longer visualized.   Malignant neoplasm metastatic to bone [name]) Bone metastases discovered 04/17/21. Enhancing osseous lesions are present in the L2 vertebral body, both iliac bones, and in the sacrum, highly concerning for osseous metastases. She received her 1st  dose of zoledronic acid on January 16th, 2023.  I once again today explained to her and her son the reasoning for this medicine.  Her widespread bone metastases show an interval increase in sclerosis of previously lytic lesions consistent with treatment response. She still has extensive sclerotic metastasis of the bone but these are stable with stable compression of T6 and L2.    Chest wall recurrence of breast cancer, left ([name]) Left supraclavicular lymph node measuring 2-3 cm. Also involvement of subpectoral node and chest wall skin. This was rapidly progressive despite receiving  trastuzumab.  We have added in Perjeta and Taxotere to her current regimen and this has been very effective.  The CT scan also shows good response of her adenopathy with resolution of the previous bulky left axillary and subpectoral adenopathy. In September of 2023, she had a 2 cm nodule  located close to the left shoulder associated with nodularity of the left upper chest wall and appeared to have obvious progression of disease again. She was changed to [name] and is responding well. The nodules have completely resolved.    Lymphedema She does have lymphedema of the left arm; however, this is better today, but with significant edema of the hand as well. We referred her to a lymphedema specialist in [name]. She sleeps with the hand and arm elevated.She has decreased use of her left hand but I think this is more than just the lymphedema.She has seen the neurologist for the nerve conduction velocities on her hand in March.    Plan: Her day 1 cycle 16 of Enhertu is scheduled on 12/07/2022. Her WBC is 5.8, hemoglobin at 13.4, and platelet count of 155,000. Her CMP is normal besides a elevated  BUN of 23 and AST of 48. Patient informed me that she has a consultation on August 12th for a consultation about her severe paraesthesias of the left upper extremity. Her last CT scan was on 07/27/2022 and she will be due for repeat around  mid September. I will see her back in 3 weeks with CBC and CMP. The patient and her son understand the plans discussed today and are in agreement with them.  She knows to contact our office if she develops concerns prior to her next appointment.  I provided 19 minutes of face-to-face time during this this encounter and > 50% was spent counseling as documented under my assessment and plan.    [name], MD  [facility] AT [facility] [address] [name] [address] Dept: [phone] Dept Fax: [phone]   CHIEF COMPLAINT:  CC: An 89 year old female with metastatic breast cancer   Current Treatment:  Enhertu  INTERVAL HISTORY:  Jasmin Lewis is here today for repeat clinical assessment for metastatic breast cancer. She has been on Enhertu since September of 2023 and is tolerating it well. She has been on zoledronic acid since January of 2023, every 6 weeks. Patient states that she feels well and has no complaints of pain.  Her day 1 cycle 16 of Enhertu is scheduled on 12/07/2022. Her WBC is 5.8, hemoglobin at 13.4, and platelet count of 155,000. Her CMP is normal besides a elevated BUN of 23 and AST of 48. Patient informed me that she has a consultation on August 12th for a consultation about her severe paraesthesias of the left upper extremity. Her last CT scan was on 07/27/2022 and she will be due for repeat around mid September. I will see her back in 3 weeks with CBC and CMP. She denies signs of infection such as sore throat, sinus drainage, cough, or urinary symptoms.  She denies fevers or recurrent chills. She denies pain. She denies nausea, vomiting, chest pain, dyspnea or cough. Her appetite is pretty good and her weight has decreased 1 pounds over last 3 weeks . Patient is accompanied in the office by her  son as usual.    I have reviewed the past medical history, past surgical history, social history and family history with the  patient and they are unchanged from previous note.  ALLERGIES:  has No Known Allergies.  MEDICATIONS:  Current Outpatient Medications  Medication Sig Dispense Refill   Biotin 1 MG CAPS Take by mouth.     fish oil-omega-3 fatty acids 1000 MG capsule Take 1 g by mouth daily.     gabapentin (NEURONTIN) 100 MG capsule Take 1 capsule (100 mg total) by mouth at bedtime. (Patient not taking: Reported on 03/05/2022) 30 capsule 5   magnesium citrate SOLN Take 0.5 Bottles by mouth as needed for severe constipation.     Multiple Vitamin (MULTIVITAMIN) capsule Take 1 capsule by mouth daily.     ondansetron (ZOFRAN) 4 MG tablet Take 1 tablet (4 mg total) by mouth every 8 (eight) hours as needed for nausea or vomiting. (Patient not taking: Reported on 03/05/2022) 20 tablet 2   polyethylene glycol (MIRALAX / GLYCOLAX) 17 g packet Take 17 g by mouth as needed. constipation     No current facility-administered medications for this visit.   HISTORY OF PRESENT ILLNESS:   Oncology History Overview Note  Cancer Staging Malignant neoplasm of upper-outer quadrant of left breast in female, estrogen receptor negative ([name]) Staging  form: Breast, AJCC 8th Edition - Clinical stage from 10/11/2018: Stage IIB (cT2, cN1, cM0, G3, ER-, PR-, HER2+) - Signed by Malachy Mood, MD on 11/02/2018 - Clinical: No stage assigned - Unsigned    History of colon cancer, stage II  09/2005 Initial Diagnosis   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 Findings were a 5.9 x 3.8 x 1.2 cm moderate to well-differentiated adenocarcinoma penetrating the muscular wall. Forty lymph nodes negative. No vascular or lymphatic invasion. Multiple diverticula with microabscess formation and inflammation. Carcinoma present in at least 1 diverticulum. Preop CEA 5.2 with lab normal range 0 to 2.5. Preop CT scan with no obvious additional pathology.    2007 -   Chemotherapy   She received oral Xeloda chemotherapy as an adjuvant. She declined treatment on a clinical trial.    11/12/2011 Initial Diagnosis   [name], stage II   12/09/2011 Procedure   Followup colonoscopy done on 12/09/2011. She was found to have 2 polyps which were removed. Chronic diverticulosis.     Malignant neoplasm of upper-outer quadrant of left breast in female, estrogen receptor negative ([name])  09/26/2018 Mammogram   Mammogram/US of left breast 09/26/18 IMPRESSION:  1. 2.9cm irregular mass in the upper outer left breast corresponds to the palpable abnormality. This is highly suspicious for breast carcinoma.  2. Two adjacent abnormal left axillary  Lymph nodes suspicious for metastatic adenopathy. There is a third borderline abnormal left axillary LN with a cortex thickened to 4mm.  3. Benign right breast cyst. No evidence of right breast malignancy.    10/11/2018 Cancer Staging   Staging form: Breast, AJCC 8th Edition - Clinical stage from 10/11/2018: Stage IIB (cT2, cN1, cM0, G3, ER-, PR-, HER2+) - Signed by Malachy Mood, MD on 11/02/2018   10/11/2018 Initial Biopsy   Diagnosis 10/11/18 1. Breast, left, needle core biopsy, upper outer left 2 o'clock - INVASIVE DUCTAL CARCINOMA. - DUCTAL CARCINOMA IN SITU. - LYMPHOVASCULAR INVASION IS IDENTIFIED. - SEE COMMENT. 2. Lymph node, needle/core biopsy, left axilla - INVASIVE DUCTAL CARCINOMA. - SEE COMMENT.   10/11/2018 Receptors her2   The tumor cells are POSITIVE for Her2 (3+). Estrogen Receptor: 0%, NEGATIVE Progesterone Receptor: 0%, NEGATIVE Proliferation Marker Ki67: 70%   11/02/2018 Initial Diagnosis   Malignant neoplasm of upper-outer quadrant of left breast in female, estrogen receptor negative ([name])   11/15/2018 Breast MRI   MRI breast 11/15/18  IMPRESSION: 1. 3.3 centimeter mass in the LATERAL portion of the LEFT breast consistent with known malignancy. 2. There is significant non mass enhancement surrounding this  mass and extending anteriorly into the nipple base, with largest diameter in the anterior to posterior axis, measuring 7.2 centimeters. 3. If the patient would consider breast conservation, additional MR guided core biopsies are recommended. Consider biopsy of the inferior and anterior extent of the non mass enhancement to document extent of disease. 4. Three enlarged LEFT axillary lymph nodes. 5. RIGHT breast is negative.   11/15/2018 PET scan   PET 11/15/18 IMPRESSION: Hypermetabolic left breast lesion compatible with known primary. Hypermetabolic left axillary lymph nodes are consistent with metastatic disease.   No evidence for additional hypermetabolic metastatic involvement in the neck, chest, abdomen, or pelvis.   11/17/2018 - 03/01/2019 Chemotherapy   Neo-adjuvant Kadcyla and perjeta q3weeks for 6 cycles starting 11/17/18. Stopped before surgery    11/29/2018 Pathology Results   Diagnosis 1. Breast, left, needle  core biopsy, inferior anterior (cylinder clip) - INVASIVE DUCTAL CARCINOMA. - DUCTAL CARCINOMA IN SITU. - LYMPHOVASCULAR INVASION IS IDENTIFIED. - SEE COMMENT. 2. Breast, left, needle core biopsy, central posterior (barbell clip) - INVASIVE DUCTAL CARCINOMA. - LYMPHOVASCULAR INVASION IS IDENTIFIED.   02/12/2019 Breast MRI   IMPRESSION: 1. Complete resolution of previously identified enhancing mass and associated non mass enhancement within the left breast. This is consistent with excellent response to chemotherapy. No residual or suspicious findings are identified. 2. No MRI evidence of malignancy on the right. 3. Previously identified left axillary lymphadenopathy not definitively seen on today's study. However, this may be due to decreased field-of-view compared to prior study.     03/14/2019 Cancer Staging   Staging form: Breast, AJCC 8th Edition - Pathologic stage from 03/14/2019: pT0, pN0, cM0, GX, ER: Unknown, PR: Unknown, HER2: Not Assessed - Signed by  Malachy Mood, MD on 03/28/2019   03/14/2019 Surgery   LEFT MASTECTOMY WITH TARGETED LYMPH NODE DISSECTION and Left Axillary Sentinel Lymph  Node Biopsy by Dr Carolynne Edouard 03/14/19    03/14/2019 Pathology Results   FINAL MICROSCOPIC DIAGNOSIS:   A. LYMPH NODE, LEFT, SENTINEL, BIOPSY:  - There is no evidence of carcinoma in 1 of 1 lymph node (0/1).   B. BREAST, LEFT, MASTECTOMY:  - Benign breast parenchyma with treatment-related changes.  - There is no evidence of malignancy.  - See oncology table below.   C. LYMPH NODE, LEFT #1, SENTINEL, BIOPSY:  - There is no evidence of carcinoma in 1 of 1 lymph node (0/1).   D. LYMPH NODE, LEFT #2, SENTINEL, BIOPSY:  - There is no evidence of carcinoma in 1 of 1 lymph node (0/1).     04/04/2019 - 04/25/2019 Chemotherapy   Maintenance Herceptin injections every 3 weeks starting 04/03/19 to complete 1 year of treatment that was started in 11/2018. Stopped after 2nd dose as she will not be repeating Echos.    06/03/2021 - 06/24/2021 Chemotherapy   Patient is on Treatment Plan : BREAST Trastuzumab q21d X 11 Cycles     06/25/2021 - 01/01/2022 Chemotherapy   Patient is on Treatment Plan : BREAST Docetaxel + Trastuzumab + Pertuzumab (THP) q21d     06/25/2021 - 01/02/2022 Chemotherapy   Patient is on Treatment Plan : BREAST Docetaxel + Trastuzumab + Pertuzumab (THP) q21d     09/14/2021 Imaging   CT chest/abdomen/pelvis  IMPRESSION:  1. Interval resolution of previously seen bulky left axillary and  subpectoral lymphadenopathy. No persistently enlarged lymph nodes.  2. Multiple liver lesions are significantly diminished in size.  3. Widespread osseous metastatic disease, with an interval increase  in sclerosis of several previously lytic lesions.  4. Constellation of findings is consistent with treatment response  of metastatic disease  5. New, although age indeterminate pathologic wedge deformity of the  T6 vertebral body as well as an increased pathologic wedge  deformity  of the L2 vertebral body.  6. Coronary artery disease.  Aortic Atherosclerosis (ICD10-I70.0).    01/26/2022 -  Chemotherapy   Patient is on Treatment Plan : BREAST METASTATIC Fam-Trastuzumab Deruxtecan-nxki (Enhertu) (5.4) q21d     Malignant neoplasm metastatic to liver ([name])  04/17/2021 Imaging   CT ABDOMEN AND PELVIS WITH CONTRAST: -New lytic lesion involving the L2 vertebral body (6:72, 2:26) with minimal cortical breakthrough superiorly, but with preservation of vertebral body height, concerning for metastatic disease.  -There are several indeterminate hepatic lesions as described above. In addition to the lesions described above, there is an  additional subtle 1.5 cm hypodensity in the hepatic dome (2:7). Given clinical history and presence of a new L2 lesion, leading differential consideration is metastatic disease. Recommend further evaluation with MRI of the abdomen with and without contrast.   05/12/2021 Imaging   MRI ABDOMEN WITH AND WITHOUT CONTRAST: Numerous heterogeneously enhancing, internally necrotic lesions in the right hepatic lobe, highly concerning for metastatic disease. Largest lesion measures up to 6.7 cm in hepatic segment VI.   Enhancing osseous lesions in the L2 vertebral body, both iliac bones, and in the sacrum, highly concerning for osseous metastases.   05/15/2021 Initial Diagnosis   Liver metastases ([name])   05/27/2021 PET scan   1. Widespread recurrent/metastatic disease in this patient who is  status post bilateral mastectomy. Left chest wall recurrence with  left axillary/subpectoral, supraclavicular nodal metastasis.  2. Hepatic, left adrenal, and widespread osseous metastasis.  3. Incidental findings, including: Right nephrolithiasis. Tiny  hiatal hernia.    05/27/2021 Imaging   MRI LUMBAR AND THORACIC SPINE: Thoracic spine:  Osseous metastatic disease involving each level. The most dramatic deposits are at T4 and T6 where there is  extraosseous/epidural tumor. No cord compression but there is foraminal impingement on the left at T6-7 and on the right at T3-4. Early dorsal epidural tumor at the level of T10 and T3.   Lumbar spine:  1. Widespread osseous metastatic disease with largest deposit  replacing the L2 body where there is a compression fracture with  mild height loss. Also at this level is early epidural tumor  extension likely affecting both L2-3 foramina.  2. 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    05/27/2021 Imaging   MRI HEAD WITH AND WITHOUT CONTRAST: Negative for metastatic disease to the brain or calvarium.   06/03/2021 - 06/24/2021 Chemotherapy   Patient is on Treatment Plan : BREAST Trastuzumab q21d X 11 Cycles     06/25/2021 - 01/01/2022 Chemotherapy   Patient is on Treatment Plan : BREAST Docetaxel + Trastuzumab + Pertuzumab (THP) q21d     06/25/2021 - 01/02/2022 Chemotherapy   Patient is on Treatment Plan : BREAST Docetaxel + Trastuzumab + Pertuzumab (THP) q21d     09/14/2021 Imaging   CT chest/abdomen/pelvis  IMPRESSION:  1. Interval resolution of previously seen bulky left axillary and  subpectoral lymphadenopathy. No persistently enlarged lymph nodes.  2. Multiple liver lesions are significantly diminished in size.  3. Widespread osseous metastatic disease, with an interval increase  in sclerosis of several previously lytic lesions.  4. Constellation of findings is consistent with treatment response  of metastatic disease  5. New, although age indeterminate pathologic wedge deformity of the  T6 vertebral body as well as an increased pathologic wedge deformity  of the L2 vertebral body.  6. Coronary artery disease.  Aortic Atherosclerosis (ICD10-I70.0).    01/26/2022 -  Chemotherapy   Patient is on Treatment Plan : BREAST METASTATIC Fam-Trastuzumab Deruxtecan-nxki (Enhertu) (5.4) q21d     Malignant neoplasm metastatic to bone ([name])  04/17/2021 Imaging   CT  ABDOMEN AND PELVIS WITH CONTRAST: -New lytic lesion involving the L2 vertebral body (6:72, 2:26) with minimal cortical breakthrough superiorly, but with preservation of vertebral body height, concerning for metastatic disease.  -There are several indeterminate hepatic lesions as described above. In addition to the lesions described above, there is an additional subtle 1.5 cm hypodensity in the hepatic dome (2:7). Given clinical history and presence of a new L2 lesion, leading differential consideration is metastatic disease. Recommend  further evaluation with MRI of the abdomen with and without contrast.   05/12/2021 Imaging   MRI ABDOMEN WITH AND WITHOUT CONTRAST: Numerous heterogeneously enhancing, internally necrotic lesions in the right hepatic lobe, highly concerning for metastatic disease. Largest lesion measures up to 6.7 cm in hepatic segment VI.   Enhancing osseous lesions in the L2 vertebral body, both iliac bones, and in the sacrum, highly concerning for osseous metastases.   05/15/2021 Initial Diagnosis   Bone metastases ([name])   05/27/2021 PET scan   1. Widespread recurrent/metastatic disease in this patient who is  status post bilateral mastectomy. Left chest wall recurrence with  left axillary/subpectoral, supraclavicular nodal metastasis.  2. Hepatic, left adrenal, and widespread osseous metastasis.  3. Incidental findings, including: Right nephrolithiasis. Tiny  hiatal hernia.    05/27/2021 Imaging   MRI LUMBAR AND THORACIC SPINE: Thoracic spine:  Osseous metastatic disease involving each level. The most dramatic deposits are at T4 and T6 where there is extraosseous/epidural tumor. No cord compression but there is foraminal impingement on the left at T6-7 and on the right at T3-4. Early dorsal epidural tumor at the level of T10 and T3.   Lumbar spine:  1. Widespread osseous metastatic disease with largest deposit  replacing the L2 body where there is a compression fracture with   mild height loss. Also at this level is early epidural tumor  extension likely affecting both L2-3 foramina.  2. 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    05/27/2021 Imaging   MRI HEAD WITH AND WITHOUT CONTRAST: Negative for metastatic disease to the brain or calvarium.   06/03/2021 - 06/24/2021 Chemotherapy   Patient is on Treatment Plan : BREAST Trastuzumab q21d X 11 Cycles     06/25/2021 - 01/01/2022 Chemotherapy   Patient is on Treatment Plan : BREAST Docetaxel + Trastuzumab + Pertuzumab (THP) q21d     06/25/2021 - 01/02/2022 Chemotherapy   Patient is on Treatment Plan : BREAST Docetaxel + Trastuzumab + Pertuzumab (THP) q21d     09/14/2021 Imaging   CT chest/abdomen/pelvis  IMPRESSION:  1. Interval resolution of previously seen bulky left axillary and  subpectoral lymphadenopathy. No persistently enlarged lymph nodes.  2. Multiple liver lesions are significantly diminished in size.  3. Widespread osseous metastatic disease, with an interval increase  in sclerosis of several previously lytic lesions.  4. Constellation of findings is consistent with treatment response  of metastatic disease  5. New, although age indeterminate pathologic wedge deformity of the  T6 vertebral body as well as an increased pathologic wedge deformity  of the L2 vertebral body.  6. Coronary artery disease.  Aortic Atherosclerosis (ICD10-I70.0).    01/26/2022 -  Chemotherapy   Patient is on Treatment Plan : BREAST METASTATIC Fam-Trastuzumab Deruxtecan-nxki (Enhertu) (5.4) q21d       REVIEW OF SYSTEMS:   Review of Systems  Constitutional: Negative.  Negative for appetite change, chills, diaphoresis, fatigue, fever and unexpected weight change.  HENT:  Negative.  Negative for hearing loss, lump/mass, mouth sores, nosebleeds, sore throat, tinnitus, trouble swallowing and voice change.   Eyes:  Positive for eye problems (tearing of the eye). Negative for icterus.   Respiratory: Negative.  Negative for chest tightness, cough, hemoptysis, shortness of breath and wheezing.   Cardiovascular: Negative.  Negative for chest pain, leg swelling and palpitations.  Gastrointestinal:  Negative for abdominal distention, abdominal pain, blood in stool, constipation, diarrhea, nausea, rectal pain and vomiting.  Endocrine: Negative.   Genitourinary: Negative.  Negative for bladder incontinence, difficulty urinating, dyspareunia, dysuria, frequency, hematuria, menstrual problem, nocturia, pelvic pain, vaginal bleeding and vaginal discharge.   Musculoskeletal:  Negative for arthralgias, back pain, flank pain, gait problem, myalgias, neck pain and neck stiffness.       Chronic left hand/nerve pain  Skin: Negative.  Negative for itching, rash and wound.  Neurological:  Positive for extremity weakness (of her left hand) and numbness (severe paresthesias inability to use her left hand). Negative for dizziness, gait problem, headaches, light-headedness, seizures and speech difficulty.  Hematological: Negative.  Negative for adenopathy. Does not bruise/bleed easily.  Psychiatric/Behavioral: Negative.  Negative for confusion, decreased concentration, depression, sleep disturbance and suicidal ideas. The patient is not nervous/anxious.    VITALS:  Blood pressure (!) 175/60, pulse 80, temperature 98 F (36.7 C), temperature source Oral, resp. rate 18, height 5' 0.25" (1.53 m), weight 107 lb 14.4 oz (48.9 kg), SpO2 98%.  Wt Readings from Last 3 Encounters:  12/07/22 108 lb 0.6 oz (49 kg)  12/02/22 107 lb 14.4 oz (48.9 kg)  11/16/22 108 lb 12 oz (49.3 kg)    Body mass index is 20.9 kg/m.  Performance status (ECOG): 1 - Symptomatic but completely ambulatory  PHYSICAL EXAM:  Physical Exam Vitals and nursing note reviewed. Exam conducted with a chaperone present.  Constitutional:      General: She is not in acute distress.    Appearance: Normal appearance. She is normal  weight. She is not ill-appearing, toxic-appearing or diaphoretic.  HENT:     Head: Normocephalic and atraumatic.     Right Ear: Tympanic membrane, ear canal and external ear normal. There is no impacted cerumen.     Left Ear: Tympanic membrane, ear canal and external ear normal. There is no impacted cerumen.     Nose: Nose normal. No congestion or rhinorrhea.     Mouth/Throat:     Mouth: Mucous membranes are moist.     Pharynx: Oropharynx is clear. No oropharyngeal exudate or posterior oropharyngeal erythema.  Eyes:     General: No scleral icterus.       Right eye: No discharge.        Left eye: No discharge.     Extraocular Movements: Extraocular movements intact.     Conjunctiva/sclera: Conjunctivae normal.     Pupils: Pupils are equal, round, and reactive to light.  Neck:     Vascular: No carotid bruit.  Cardiovascular:     Rate and Rhythm: Normal rate and regular rhythm.     Pulses: Normal pulses.     Heart sounds: Normal heart sounds. No murmur heard.    No friction rub. No gallop.  Pulmonary:     Effort: Pulmonary effort is normal. No respiratory distress.     Breath sounds: Normal breath sounds. No stridor. No wheezing, rhonchi or rales.  Chest:     Chest wall: No tenderness.  Breasts:    Right: Absent.     Left: Absent.     Comments: Left mastectomy is negative. Right breast is without masses Abdominal:     General: Bowel sounds are normal. There is no distension.     Palpations: Abdomen is soft. There is no mass.     Tenderness: There is no abdominal tenderness. There is no right CVA tenderness, left CVA tenderness, guarding or rebound.     Hernia: No hernia is present.  Musculoskeletal:        General: No swelling, tenderness, deformity or signs of injury. Normal range  of motion.     Left upper arm: Edema present.     Left forearm: Edema present.     Cervical back: Normal range of motion and neck supple. No rigidity or tenderness.     Right lower leg: No edema.      Left lower leg: No edema.     Comments: 1+ lymphedema of the left upper extremity  Lymphadenopathy:     Cervical: No cervical adenopathy.  Skin:    General: Skin is warm and dry.     Coloration: Skin is not jaundiced or pale.     Findings: No bruising, erythema (mild), lesion or rash.  Neurological:     Mental Status: She is alert and oriented to person, place, and time. Mental status is at baseline.     Cranial Nerves: No cranial nerve deficit.     Sensory: No sensory deficit.     Motor: Weakness (She has severe paresthesias of the left upper extremity with inability to use her left hand) present.     Coordination: Coordination normal.     Gait: Gait normal.     Deep Tendon Reflexes: Reflexes normal.  Psychiatric:        Mood and Affect: Mood normal.        Behavior: Behavior normal.        Thought Content: Thought content normal.        Judgment: Judgment normal.    LABORATORY DATA:  I have reviewed the data as listed    Component Value Date/Time   NA 139 12/02/2022 0000   NA 137 11/13/2012 1057   K 4.1 12/02/2022 0000   K 4.5 11/13/2012 1057   CL 107 12/02/2022 0000   CO2 20 12/02/2022 0000   CO2 26 11/13/2012 1057   GLUCOSE 101 (H) 09/30/2022 1357   GLUCOSE 90 11/13/2012 1057   BUN 23 (A) 12/02/2022 0000   BUN 18.3 11/13/2012 1057   CREATININE 0.6 12/02/2022 0000   CREATININE 0.76 09/30/2022 1357   CREATININE 0.8 11/13/2012 1057   CALCIUM 9.9 12/02/2022 0000   CALCIUM 9.7 11/13/2012 1057   PROT 6.6 09/30/2022 1357   PROT 7.4 11/13/2012 1057   ALBUMIN 4.3 12/02/2022 0000   ALBUMIN 3.9 11/13/2012 1057   AST 48 (A) 12/02/2022 0000   AST 36 09/30/2022 1357   AST 22 11/13/2012 1057   ALT 19 12/02/2022 0000   ALT 17 09/30/2022 1357   ALT 15 11/13/2012 1057   ALKPHOS 65 12/02/2022 0000   ALKPHOS 77 11/13/2012 1057   BILITOT 0.3 09/30/2022 1357   BILITOT 0.54 11/13/2012 1057   GFRNONAA >60 09/30/2022 1357   GFRNONAA >60 09/09/2022 0000   GFRAA >60 04/25/2019  1045   No results found for: "SPEP", "UPEP"  Lab Results  Component Value Date   WBC 5.8 12/02/2022   NEUTROABS 3.13 12/02/2022   HGB 13.4 12/02/2022   HCT 39 12/02/2022   MCV 100 (A) 04/15/2022   PLT 155 12/02/2022     Chemistry      Component Value Date/Time   NA 139 12/02/2022 0000   NA 137 11/13/2012 1057   K 4.1 12/02/2022 0000   K 4.5 11/13/2012 1057   CL 107 12/02/2022 0000   CO2 20 12/02/2022 0000   CO2 26 11/13/2012 1057   BUN 23 (A) 12/02/2022 0000   BUN 18.3 11/13/2012 1057   CREATININE 0.6 12/02/2022 0000   CREATININE 0.76 09/30/2022 1357   CREATININE 0.8 11/13/2012 1057   GLU  86 12/02/2022 0000      Component Value Date/Time   CALCIUM 9.9 12/02/2022 0000   CALCIUM 9.7 11/13/2012 1057   ALKPHOS 65 12/02/2022 0000   ALKPHOS 77 11/13/2012 1057   AST 48 (A) 12/02/2022 0000   AST 36 09/30/2022 1357   AST 22 11/13/2012 1057   ALT 19 12/02/2022 0000   ALT 17 09/30/2022 1357   ALT 15 11/13/2012 1057   BILITOT 0.3 09/30/2022 1357   BILITOT 0.54 11/13/2012 1057     RADIOGRAPHIC STUDIES:      I,Jasmine M Lassiter,acting as a scribe for [name], MD.,have documented all relevant documentation on the behalf of [name], MD,as directed by  [name], MD while in the presence of [name], MD.

## 2022-12-05 ENCOUNTER — Other Ambulatory Visit: Payer: Self-pay

## 2022-12-06 MED FILL — Dexamethasone Sodium Phosphate Inj 100 MG/10ML: INTRAMUSCULAR | Qty: 1 | Status: AC

## 2022-12-06 MED FILL — Fam-Trastuzumab Deruxtecan-nxki For IV Soln 100 MG: INTRAVENOUS | Qty: 10 | Status: AC

## 2022-12-06 MED FILL — Zoledronic Acid Inj Conc For IV Infusion 4 MG/5ML: INTRAVENOUS | Qty: 3.75 | Status: AC

## 2022-12-07 ENCOUNTER — Inpatient Hospital Stay: Payer: Medicare Other

## 2022-12-07 VITALS — BP 167/67 | HR 78 | Temp 97.7°F | Resp 20 | Ht 60.25 in | Wt 108.0 lb

## 2022-12-07 DIAGNOSIS — C50412 Malignant neoplasm of upper-outer quadrant of left female breast: Secondary | ICD-10-CM

## 2022-12-07 DIAGNOSIS — Z5112 Encounter for antineoplastic immunotherapy: Secondary | ICD-10-CM | POA: Diagnosis not present

## 2022-12-07 DIAGNOSIS — C7951 Secondary malignant neoplasm of bone: Secondary | ICD-10-CM

## 2022-12-07 DIAGNOSIS — C787 Secondary malignant neoplasm of liver and intrahepatic bile duct: Secondary | ICD-10-CM

## 2022-12-07 MED ORDER — HEPARIN SOD (PORK) LOCK FLUSH 100 UNIT/ML IV SOLN
500.0000 [IU] | Freq: Once | INTRAVENOUS | Status: AC | PRN
  Administered 2022-12-07: 500 [IU]

## 2022-12-07 MED ORDER — DIPHENHYDRAMINE HCL 25 MG PO CAPS
25.0000 mg | ORAL_CAPSULE | Freq: Once | ORAL | Status: AC
  Administered 2022-12-07: 25 mg via ORAL
  Filled 2022-12-07: qty 1

## 2022-12-07 MED ORDER — DEXTROSE 5 % IV SOLN: Freq: Once | INTRAVENOUS | Status: AC

## 2022-12-07 MED ORDER — ZOLEDRONIC ACID 4 MG/5ML IV CONC
3.0000 mg | Freq: Once | INTRAVENOUS | Status: AC
  Administered 2022-12-07: 3 mg via INTRAVENOUS
  Filled 2022-12-07: qty 3.75

## 2022-12-07 MED ORDER — SODIUM CHLORIDE 0.9% FLUSH
10.0000 mL | INTRAVENOUS | Status: DC | PRN
  Administered 2022-12-07: 10 mL

## 2022-12-07 MED ORDER — ACETAMINOPHEN 325 MG PO TABS
650.0000 mg | ORAL_TABLET | Freq: Once | ORAL | Status: AC
  Administered 2022-12-07: 650 mg via ORAL
  Filled 2022-12-07: qty 2

## 2022-12-07 MED ORDER — FAM-TRASTUZUMAB DERUXTECAN-NXKI CHEMO 100 MG IV SOLR
4.0500 mg/kg | Freq: Once | INTRAVENOUS | Status: AC
  Administered 2022-12-07: 200 mg via INTRAVENOUS
  Filled 2022-12-07: qty 10

## 2022-12-07 MED ORDER — SODIUM CHLORIDE 0.9 % IV SOLN
10.0000 mg | Freq: Once | INTRAVENOUS | Status: AC
  Administered 2022-12-07: 10 mg via INTRAVENOUS
  Filled 2022-12-07: qty 10

## 2022-12-07 MED ORDER — PALONOSETRON HCL INJECTION 0.25 MG/5ML
0.2500 mg | Freq: Once | INTRAVENOUS | Status: AC
  Administered 2022-12-07: 0.25 mg via INTRAVENOUS
  Filled 2022-12-07: qty 5

## 2022-12-07 NOTE — Patient Instructions (Signed)
Fam-Trastuzumab Deruxtecan Injection What is this medication? FAM-TRASTUZUMAB DERUXTECAN (fam-tras TOOZ eu mab DER ux TEE kan) treats some types of cancer. It works by blocking a protein that causes cancer cells to grow and multiply. This helps to slow or stop the spread of cancer cells. This medicine may be used for other purposes; ask your health care provider or pharmacist if you have questions. COMMON BRAND NAME(S): ENHERTU What should I tell my care team before I take this medication? They need to know if you have any of these conditions: Heart disease Heart failure Infection, especially a viral infection, such as chickenpox, cold sores, or herpes Liver disease Lung or breathing disease, such as asthma or COPD An unusual or allergic reaction to fam-trastuzumab deruxtecan, other medications, foods, dyes, or preservatives Pregnant or trying to get pregnant Breast-feeding How should I use this medication? This medication is injected into a vein. It is given by your care team in a hospital or clinic setting. A special MedGuide will be given to you before each treatment. Be sure to read this information carefully each time. Talk to your care team about the use of this medication in children. Special care may be needed. Overdosage: If you think you have taken too much of this medicine contact a poison control center or emergency room at once. NOTE: This medicine is only for you. Do not share this medicine with others. What if I miss a dose? It is important not to miss your dose. Call your care team if you are unable to keep an appointment. What may interact with this medication? Interactions are not expected. This list may not describe all possible interactions. Give your health care provider a list of all the medicines, herbs, non-prescription drugs, or dietary supplements you use. Also tell them if you smoke, drink alcohol, or use illegal drugs. Some items may interact with your  medicine. What should I watch for while using this medication? Visit your care team for regular checks on your progress. Tell your care team if your symptoms do not start to get better or if they get worse. Your condition will be monitored carefully while you are receiving this medication. Do not become pregnant while taking this medication or for 7 months after stopping it. Women should inform their care team if they wish to become pregnant or think they might be pregnant. Men should not father a child while taking this medication and for 4 months after stopping it. There is potential for serious side effects to an unborn child. Talk to your care team for more information. Do not breast-feed an infant while taking this medication or for 7 months after the last dose. This medication has caused decreased sperm counts in some men. This may make it more difficult to father a child. Talk to your care team if you are concerned about your fertility. This medication may increase your risk to bruise or bleed. Call your care team if you notice any unusual bleeding. Be careful brushing or flossing your teeth or using a toothpick because you may get an infection or bleed more easily. If you have any dental work done, tell your dentist you are receiving this medication. This medication may cause dry eyes and blurred vision. If you wear contact lenses, you may feel some discomfort. Lubricating eye drops may help. See your care team if the problem does not go away or is severe. This medication may increase your risk of getting an infection. Call your care team for  advice if you get a fever, chills, sore throat, or other symptoms of a cold or flu. Do not treat yourself. Try to avoid being around people who are sick. Avoid taking medications that contain aspirin, acetaminophen, ibuprofen, naproxen, or ketoprofen unless instructed by your care team. These medications may hide a fever. What side effects may I notice from  receiving this medication? Side effects that you should report to your care team as soon as possible: Allergic reactions--skin rash, itching, hives, swelling of the face, lips, tongue, or throat Dry cough, shortness of breath or trouble breathing Infection--fever, chills, cough, sore throat, wounds that don't heal, pain or trouble when passing urine, general feeling of discomfort or being unwell Heart failure--shortness of breath, swelling of the ankles, feet, or hands, sudden weight gain, unusual weakness or fatigue Unusual bruising or bleeding Side effects that usually do not require medical attention (report these to your care team if they continue or are bothersome): Constipation Diarrhea Hair loss Muscle pain Nausea Vomiting This list may not describe all possible side effects. Call your doctor for medical advice about side effects. You may report side effects to FDA at 1-800-FDA-1088. Where should I keep my medication? This medication is given in a hospital or clinic. It will not be stored at home. NOTE: This sheet is a summary. It may not cover all possible information. If you have questions about this medicine, talk to your doctor, pharmacist, or health care provider.  2024 [employer]/Gold Standard (2021-02-10 00:00:00) Zoledronic Acid Injection (Cancer) What is this medication? ZOLEDRONIC ACID (ZOE le dron ik AS id) treats high calcium levels in the blood caused by cancer. It may also be used with chemotherapy to treat weakened bones caused by cancer. It works by slowing down the release of calcium from bones. This lowers calcium levels in your blood. It also makes your bones stronger and less likely to break (fracture). It belongs to a group of medications called bisphosphonates. This medicine may be used for other purposes; ask your health care provider or pharmacist if you have questions. COMMON BRAND NAME(S): Zometa, Zometa Powder What should I tell my care team before I take this  medication? They need to know if you have any of these conditions: Dehydration Dental disease Kidney disease Liver disease Low levels of calcium in the blood Lung or breathing disease, such as asthma Receiving steroids, such as dexamethasone or prednisone An unusual or allergic reaction to zoledronic acid, other medications, foods, dyes, or preservatives Pregnant or trying to get pregnant Breast-feeding How should I use this medication? This medication is injected into a vein. It is given by your care team in a hospital or clinic setting. Talk to your care team about the use of this medication in children. Special care may be needed. Overdosage: If you think you have taken too much of this medicine contact a poison control center or emergency room at once. NOTE: This medicine is only for you. Do not share this medicine with others. What if I miss a dose? Keep appointments for follow-up doses. It is important not to miss your dose. Call your care team if you are unable to keep an appointment. What may interact with this medication? Certain antibiotics given by injection Diuretics, such as bumetanide, furosemide NSAIDs, medications for pain and inflammation, such as ibuprofen or naproxen Teriparatide Thalidomide This list may not describe all possible interactions. Give your health care provider a list of all the medicines, herbs, non-prescription drugs, or dietary supplements you use.  Also tell them if you smoke, drink alcohol, or use illegal drugs. Some items may interact with your medicine. What should I watch for while using this medication? Visit your care team for regular checks on your progress. It may be some time before you see the benefit from this medication. Some people who take this medication have severe bone, joint, or muscle pain. This medication may also increase your risk for jaw problems or a broken thigh bone. Tell your care team right away if you have severe pain in  your jaw, bones, joints, or muscles. Tell you care team if you have any pain that does not go away or that gets worse. Tell your dentist and dental surgeon that you are taking this medication. You should not have major dental surgery while on this medication. See your dentist to have a dental exam and fix any dental problems before starting this medication. Take good care of your teeth while on this medication. Make sure you see your dentist for regular follow-up appointments. You should make sure you get enough calcium and vitamin D while you are taking this medication. Discuss the foods you eat and the vitamins you take with your care team. Check with your care team if you have severe diarrhea, nausea, and vomiting, or if you sweat a lot. The loss of too much body fluid may make it dangerous for you to take this medication. You may need bloodwork while taking this medication. Talk to your care team if you wish to become pregnant or think you might be pregnant. This medication can cause serious birth defects. What side effects may I notice from receiving this medication? Side effects that you should report to your care team as soon as possible: Allergic reactions--skin rash, itching, hives, swelling of the face, lips, tongue, or throat Kidney injury--decrease in the amount of urine, swelling of the ankles, hands, or feet Low calcium level--muscle pain or cramps, confusion, tingling, or numbness in the hands or feet Osteonecrosis of the jaw--pain, swelling, or redness in the mouth, numbness of the jaw, poor healing after dental work, unusual discharge from the mouth, visible bones in the mouth Severe bone, joint, or muscle pain Side effects that usually do not require medical attention (report to your care team if they continue or are bothersome): Constipation Fatigue Fever Loss of appetite Nausea Stomach pain This list may not describe all possible side effects. Call your doctor for medical  advice about side effects. You may report side effects to FDA at 1-800-FDA-1088. Where should I keep my medication? This medication is given in a hospital or clinic. It will not be stored at home. NOTE: This sheet is a summary. It may not cover all possible information. If you have questions about this medicine, talk to your doctor, pharmacist, or health care provider.  2024 [employer]/Gold Standard (2021-06-19 00:00:00)

## 2022-12-08 ENCOUNTER — Encounter: Payer: Self-pay | Admitting: Oncology

## 2022-12-08 NOTE — Telephone Encounter (Signed)
Created in error

## 2022-12-15 ENCOUNTER — Encounter: Payer: Self-pay | Admitting: Oncology

## 2022-12-20 ENCOUNTER — Encounter: Payer: Self-pay | Admitting: Oncology

## 2022-12-23 ENCOUNTER — Inpatient Hospital Stay: Payer: Medicare Other

## 2022-12-23 ENCOUNTER — Encounter: Payer: Self-pay | Admitting: Oncology

## 2022-12-23 ENCOUNTER — Inpatient Hospital Stay: Payer: Medicare Other | Attending: Oncology | Admitting: Oncology

## 2022-12-23 VITALS — BP 156/61 | HR 69 | Temp 97.8°F | Resp 18 | Ht 60.25 in | Wt 109.2 lb

## 2022-12-23 DIAGNOSIS — C50412 Malignant neoplasm of upper-outer quadrant of left female breast: Secondary | ICD-10-CM | POA: Insufficient documentation

## 2022-12-23 DIAGNOSIS — C7951 Secondary malignant neoplasm of bone: Secondary | ICD-10-CM

## 2022-12-23 DIAGNOSIS — Z5112 Encounter for antineoplastic immunotherapy: Secondary | ICD-10-CM | POA: Insufficient documentation

## 2022-12-23 DIAGNOSIS — C787 Secondary malignant neoplasm of liver and intrahepatic bile duct: Secondary | ICD-10-CM

## 2022-12-23 DIAGNOSIS — Z171 Estrogen receptor negative status [ER-]: Secondary | ICD-10-CM

## 2022-12-23 DIAGNOSIS — Z85038 Personal history of other malignant neoplasm of large intestine: Secondary | ICD-10-CM | POA: Insufficient documentation

## 2022-12-23 DIAGNOSIS — Z79899 Other long term (current) drug therapy: Secondary | ICD-10-CM | POA: Insufficient documentation

## 2022-12-23 LAB — CBC AND DIFFERENTIAL
HCT: 38 (ref 36–46)
Hemoglobin: 13.1 (ref 12.0–16.0)
Neutrophils Absolute: 2.65
Platelets: 165 10*3/uL (ref 150–400)
WBC: 4.9

## 2022-12-23 LAB — HEPATIC FUNCTION PANEL
ALT: 19 U/L (ref 7–35)
AST: 48 — AB (ref 13–35)
Alkaline Phosphatase: 61 (ref 25–125)
Bilirubin, Total: 0.5

## 2022-12-23 LAB — CBC: RBC: 3.62 — AB (ref 3.87–5.11)

## 2022-12-23 LAB — BASIC METABOLIC PANEL
BUN: 20 (ref 4–21)
CO2: 26 — AB (ref 13–22)
Chloride: 108 (ref 99–108)
Creatinine: 0.7 (ref 0.5–1.1)
Glucose: 106
Potassium: 4.3 mEq/L (ref 3.5–5.1)
Sodium: 139 (ref 137–147)

## 2022-12-23 LAB — COMPREHENSIVE METABOLIC PANEL
Albumin: 4.1 (ref 3.5–5.0)
Calcium: 10.1 (ref 8.7–10.7)

## 2022-12-23 NOTE — Progress Notes (Signed)
Patient Care Team: Jasmin Giovanni, MD as PCP - General (Family Medicine) Jasmin Beckwith, MD as Consulting Physician (Oncology)  Clinic Day: 12/23/2022  Referring physician: [name], MD  ASSESSMENT & PLAN:  Assessment & Plan: Malignant neoplasm of upper-outer quadrant of left breast in female, estrogen receptor negative ([name]) Stage IIB (T2c N1 M0) HER2 receptor positive left breast cancer, diagnosed in June 2020. This was ER/PR negative. This was treated with neoadjuvant Kadcyla/Perjeta and left mastectomy, with a complete response in breast and nodes. She had only received two doses of adjuvant Herceptin before being discontinued in December 2020.   History of colon cancer, stage II History of stage II colon cancer, diagnosed in May 2007. This was treated with surgical resection and adjuvant chemotherapy  with Xeloda. Colonoscopy from 2013 revealed 2 polyps which were resected and chronic diverticulosis.   Malignant neoplasm metastatic to liver [facility]) Liver metastases discovered 04/17/21.  Numerous heterogeneously enhancing, internally necrotic lesions in the right hepatic lobe, consistent with metastatic disease. Largest lesion measures up to 6.7 cm and there are at least 6 lesions. We now have biopsy proven HER2 positive recurrent breast cancer with ER/PR negative. She received THP and tolerated it without significant difficulty.  CT scan showed excellent response with significant decrease in the size of her liver lesions. She later had progression of this disease and was switched to Enhertu. Her CT of chest, abdomen, and pelvis on 07/27/2022 looked good with stable liver lesions which are subtle and at least 1 lesion is no longer visualized.   Malignant neoplasm metastatic to bone [facility]) Bone metastases discovered 04/17/21. Enhancing osseous lesions are present in the L2 vertebral body, both iliac bones, and in the sacrum, highly concerning for osseous metastases. She received her 1st  dose of zoledronic acid on January 16th, 2023.  I once again today explained to her and her son the reasoning for this medicine.  Her widespread bone metastases show an interval increase in sclerosis of previously lytic lesions consistent with treatment response. She still has extensive sclerotic metastasis of the bone but these are stable with stable compression of T6 and L2.    Chest wall recurrence of breast cancer, left ([name]) Left supraclavicular lymph node measuring 2-3 cm. Also involvement of subpectoral node and chest wall skin. This was rapidly progressive despite receiving  trastuzumab.  We have added in Perjeta and Taxotere to her current regimen and this has been very effective.  The CT scan also shows good response of her adenopathy with resolution of the previous bulky left axillary and subpectoral adenopathy. In September of 2023, she had a 2 cm nodule  located close to the left shoulder associated with nodularity of the left upper chest wall and appeared to have obvious progression of disease again. She was changed to [facility] and is responding well. The nodules have completely resolved.    Lymphedema She does have lymphedema of the left arm; however, this is better today, but with significant edema of the hand as well. We referred her to a lymphedema specialist in [place]. She sleeps with the hand and arm elevated.She has decreased use of her left hand but I think this is more than just the lymphedema.She has seen the neurologist for the nerve conduction velocities on her hand in March.    Plan: She informed me that she has some pink raised areas on her leg and chest that she would like me to look at. These of the chest areas do look  suspicious but I am not sure whether this is an allergic reaction in the form of hives or her cancer coming back. I informed her that we will be able to tell more by the time she reutrns in 3 weeks. If this does represent progression of diease, I will plan  repeat CT of chest, abdomen, and pelvis to evaluate and distant metastatic disease in September. If this is cancer progression I informed her of Kadcyla as another option.  Her day 1 cycle 17 of Enhertu is scheduled on 12/28/2022. She now has an appointment at [facility] neurology on 01/25/2023 and on 03/04/2023. She will see Dr. Nedra Hai next Monday. Her WBC is 4.9, hemoglobin is 13.1, and platelet count is 165,000 as of today. Her CMP is nearly normal other than an elevated AST of 48. I will see her back in 3 weeks with CBC and CMP. The patient and her son understand the plans discussed today and are in agreement with them.  She knows to contact our office if she develops concerns prior to her next appointment.  I provided 25 minutes of face-to-face time during this this encounter and > 50% was spent counseling as documented under my assessment and plan.    Jasmin Beckwith, MD  [facility] AT [facility] [address] [place] [address] Dept: [phone] Dept Fax: [phone]   CHIEF COMPLAINT:  CC: An 89 year old female with metastatic breast cancer   Current Treatment:  Enhertu  INTERVAL HISTORY:  Jasmin Lewis is here today for repeat clinical assessment for metastatic breast cancer. She has been on Enhertu since September of 2023 and is tolerating it well. She has been on zoledronic acid since January of 2023, every 6 weeks. Patient states that she feels well and complains of chronic left hand/arm pain. She informed me that she has some pink raised areas on her leg and chest that she would like me to look at. These areas of the chest do look suspicious but I am not sure whether this is an allergic reaction in the form of hives or her cancer coming back. I informed her that we will be able to tell more by the time she reutrns in 3 weeks. If this does represent progression of diease, I will plan repeat CT of chest, abdomen, and pelvis to evaluate and  distant metastatic disease in September. If this is cancer progression I informed her of Kadcyla as another option.  Her day 1 cycle 17 of Enhertu is scheduled on 12/28/2022. She now has an appointment at [facility] neurology on 01/25/2023 and on 03/04/2023. She will see Dr. Nedra Hai next Monday. Her WBC is 4.9, hemoglobin is 13.1, and platelet count is 165,000 as of today. Her CMP is nearly normal other than an elevated AST of 48. I will see her back in 3 weeks with CBC and CMP. She denies signs of infection such as sore throat, sinus drainage, cough, or urinary symptoms.  She denies fevers or recurrent chills. She denies pain. She denies nausea, vomiting, chest pain, dyspnea or cough. Her appetite is pretty good and her weight has increased 1 pounds over last 2 weeks . Patient is accompanied in the office by her  son as usual.    I have reviewed the past medical history, past surgical history, social history and family history with the patient and they are unchanged from previous note.  ALLERGIES:  has No Known Allergies.  MEDICATIONS:  Current Outpatient Medications  Medication Sig Dispense Refill   Biotin 1 MG CAPS Take by mouth.     fish oil-omega-3 fatty acids 1000 MG capsule Take 1 g by mouth daily.     gabapentin (NEURONTIN) 100 MG capsule Take 1 capsule (100 mg total) by mouth at bedtime. (Patient not taking: Reported on 03/05/2022) 30 capsule 5   magnesium citrate SOLN Take 0.5 Bottles by mouth as needed for severe constipation.     Multiple Vitamin (MULTIVITAMIN) capsule Take 1 capsule by mouth daily.     ondansetron (ZOFRAN) 4 MG tablet Take 1 tablet (4 mg total) by mouth every 8 (eight) hours as needed for nausea or vomiting. (Patient not taking: Reported on 03/05/2022) 20 tablet 2   polyethylene glycol (MIRALAX / GLYCOLAX) 17 g packet Take 17 g by mouth as needed. constipation     No current facility-administered medications for this visit.   HISTORY OF PRESENT ILLNESS:   Oncology History  Overview Note  Cancer Staging Malignant neoplasm of upper-outer quadrant of left breast in female, estrogen receptor negative ([name]) Staging form: Breast, AJCC 8th Edition - Clinical stage from 10/11/2018: Stage IIB (cT2, cN1, cM0, G3, ER-, PR-, HER2+) - Signed by Malachy Mood, MD on 11/02/2018 - Clinical: No stage assigned - Unsigned    History of colon cancer, stage II  09/2005 Initial Diagnosis   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 Findings were a 5.9 x 3.8 x 1.2 cm moderate to well-differentiated adenocarcinoma penetrating the muscular wall. Forty lymph nodes negative. No vascular or lymphatic invasion. Multiple diverticula with microabscess formation and inflammation. Carcinoma present in at least 1 diverticulum. Preop CEA 5.2 with lab normal range 0 to 2.5. Preop CT scan with no obvious additional pathology.    2007 -  Chemotherapy   She received oral Xeloda chemotherapy as an adjuvant. She declined treatment on a clinical trial.    11/12/2011 Initial Diagnosis   [name], stage II   12/09/2011 Procedure   Followup colonoscopy done on 12/09/2011. She was found to have 2 polyps which were removed. Chronic diverticulosis.     Malignant neoplasm of upper-outer quadrant of left breast in female, estrogen receptor negative ([name])  09/26/2018 Mammogram   Mammogram/US of left breast 09/26/18 IMPRESSION:  1. 2.9cm irregular mass in the upper outer left breast corresponds to the palpable abnormality. This is highly suspicious for breast carcinoma.  2. Two adjacent abnormal left axillary  Lymph nodes suspicious for metastatic adenopathy. There is a third borderline abnormal left axillary LN with a cortex thickened to 4mm.  3. Benign right breast cyst. No evidence of right breast malignancy.    10/11/2018 Cancer Staging   Staging form: Breast, AJCC 8th Edition - Clinical stage from 10/11/2018:  Stage IIB (cT2, cN1, cM0, G3, ER-, PR-, HER2+) - Signed by Malachy Mood, MD on 11/02/2018   10/11/2018 Initial Biopsy   Diagnosis 10/11/18 1. Breast, left, needle core biopsy, upper outer left 2 o'clock - INVASIVE DUCTAL CARCINOMA. - DUCTAL CARCINOMA IN SITU. - LYMPHOVASCULAR INVASION IS IDENTIFIED. - SEE COMMENT. 2. Lymph node, needle/core biopsy, left axilla - INVASIVE DUCTAL CARCINOMA. - SEE COMMENT.   10/11/2018 Receptors her2   The tumor cells are POSITIVE for Her2 (3+). Estrogen Receptor: 0%, NEGATIVE Progesterone Receptor: 0%, NEGATIVE Proliferation Marker Ki67: 70%   11/02/2018 Initial Diagnosis   Malignant neoplasm of upper-outer quadrant of left breast in female, estrogen receptor  negative ([name])   11/15/2018 Breast MRI   MRI breast 11/15/18  IMPRESSION: 1. 3.3 centimeter mass in the LATERAL portion of the LEFT breast consistent with known malignancy. 2. There is significant non mass enhancement surrounding this mass and extending anteriorly into the nipple base, with largest diameter in the anterior to posterior axis, measuring 7.2 centimeters. 3. If the patient would consider breast conservation, additional MR guided core biopsies are recommended. Consider biopsy of the inferior and anterior extent of the non mass enhancement to document extent of disease. 4. Three enlarged LEFT axillary lymph nodes. 5. RIGHT breast is negative.   11/15/2018 PET scan   PET 11/15/18 IMPRESSION: Hypermetabolic left breast lesion compatible with known primary. Hypermetabolic left axillary lymph nodes are consistent with metastatic disease.   No evidence for additional hypermetabolic metastatic involvement in the neck, chest, abdomen, or pelvis.   11/17/2018 - 03/01/2019 Chemotherapy   Neo-adjuvant Kadcyla and perjeta q3weeks for 6 cycles starting 11/17/18. Stopped before surgery    11/29/2018 Pathology Results   Diagnosis 1. Breast, left, needle core biopsy, inferior anterior (cylinder clip) -  INVASIVE DUCTAL CARCINOMA. - DUCTAL CARCINOMA IN SITU. - LYMPHOVASCULAR INVASION IS IDENTIFIED. - SEE COMMENT. 2. Breast, left, needle core biopsy, central posterior (barbell clip) - INVASIVE DUCTAL CARCINOMA. - LYMPHOVASCULAR INVASION IS IDENTIFIED.   02/12/2019 Breast MRI   IMPRESSION: 1. Complete resolution of previously identified enhancing mass and associated non mass enhancement within the left breast. This is consistent with excellent response to chemotherapy. No residual or suspicious findings are identified. 2. No MRI evidence of malignancy on the right. 3. Previously identified left axillary lymphadenopathy not definitively seen on today's study. However, this may be due to decreased field-of-view compared to prior study.     03/14/2019 Cancer Staging   Staging form: Breast, AJCC 8th Edition - Pathologic stage from 03/14/2019: pT0, pN0, cM0, GX, ER: Unknown, PR: Unknown, HER2: Not Assessed - Signed by Malachy Mood, MD on 03/28/2019   03/14/2019 Surgery   LEFT MASTECTOMY WITH TARGETED LYMPH NODE DISSECTION and Left Axillary Sentinel Lymph  Node Biopsy by Dr Carolynne Edouard 03/14/19    03/14/2019 Pathology Results   FINAL MICROSCOPIC DIAGNOSIS:   A. LYMPH NODE, LEFT, SENTINEL, BIOPSY:  - There is no evidence of carcinoma in 1 of 1 lymph node (0/1).   B. BREAST, LEFT, MASTECTOMY:  - Benign breast parenchyma with treatment-related changes.  - There is no evidence of malignancy.  - See oncology table below.   C. LYMPH NODE, LEFT #1, SENTINEL, BIOPSY:  - There is no evidence of carcinoma in 1 of 1 lymph node (0/1).   D. LYMPH NODE, LEFT #2, SENTINEL, BIOPSY:  - There is no evidence of carcinoma in 1 of 1 lymph node (0/1).     04/04/2019 - 04/25/2019 Chemotherapy   Maintenance Herceptin injections every 3 weeks starting 04/03/19 to complete 1 year of treatment that was started in 11/2018. Stopped after 2nd dose as she will not be repeating Echos.    06/03/2021 - 06/24/2021 Chemotherapy    Patient is on Treatment Plan : BREAST Trastuzumab q21d X 11 Cycles     06/25/2021 - 01/01/2022 Chemotherapy   Patient is on Treatment Plan : BREAST Docetaxel + Trastuzumab + Pertuzumab (THP) q21d     06/25/2021 - 01/02/2022 Chemotherapy   Patient is on Treatment Plan : BREAST Docetaxel + Trastuzumab + Pertuzumab (THP) q21d     09/14/2021 Imaging   CT chest/abdomen/pelvis  IMPRESSION:  1. Interval resolution of previously  seen bulky left axillary and  subpectoral lymphadenopathy. No persistently enlarged lymph nodes.  2. Multiple liver lesions are significantly diminished in size.  3. Widespread osseous metastatic disease, with an interval increase  in sclerosis of several previously lytic lesions.  4. Constellation of findings is consistent with treatment response  of metastatic disease  5. New, although age indeterminate pathologic wedge deformity of the  T6 vertebral body as well as an increased pathologic wedge deformity  of the L2 vertebral body.  6. Coronary artery disease.  Aortic Atherosclerosis (ICD10-I70.0).    01/26/2022 -  Chemotherapy   Patient is on Treatment Plan : BREAST METASTATIC Fam-Trastuzumab Deruxtecan-nxki (Enhertu) (5.4) q21d     Malignant neoplasm metastatic to liver ([name])  04/17/2021 Imaging   CT ABDOMEN AND PELVIS WITH CONTRAST: -New lytic lesion involving the L2 vertebral body (6:72, 2:26) with minimal cortical breakthrough superiorly, but with preservation of vertebral body height, concerning for metastatic disease.  -There are several indeterminate hepatic lesions as described above. In addition to the lesions described above, there is an additional subtle 1.5 cm hypodensity in the hepatic dome (2:7). Given clinical history and presence of a new L2 lesion, leading differential consideration is metastatic disease. Recommend further evaluation with MRI of the abdomen with and without contrast.   05/12/2021 Imaging   MRI ABDOMEN WITH AND WITHOUT  CONTRAST: Numerous heterogeneously enhancing, internally necrotic lesions in the right hepatic lobe, highly concerning for metastatic disease. Largest lesion measures up to 6.7 cm in hepatic segment VI.   Enhancing osseous lesions in the L2 vertebral body, both iliac bones, and in the sacrum, highly concerning for osseous metastases.   05/15/2021 Initial Diagnosis   Liver metastases ([name])   05/27/2021 PET scan   1. Widespread recurrent/metastatic disease in this patient who is  status post bilateral mastectomy. Left chest wall recurrence with  left axillary/subpectoral, supraclavicular nodal metastasis.  2. Hepatic, left adrenal, and widespread osseous metastasis.  3. Incidental findings, including: Right nephrolithiasis. Tiny  hiatal hernia.    05/27/2021 Imaging   MRI LUMBAR AND THORACIC SPINE: Thoracic spine:  Osseous metastatic disease involving each level. The most dramatic deposits are at T4 and T6 where there is extraosseous/epidural tumor. No cord compression but there is foraminal impingement on the left at T6-7 and on the right at T3-4. Early dorsal epidural tumor at the level of T10 and T3.   Lumbar spine:  1. Widespread osseous metastatic disease with largest deposit  replacing the L2 body where there is a compression fracture with  mild height loss. Also at this level is early epidural tumor  extension likely affecting both L2-3 foramina.  2. 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    05/27/2021 Imaging   MRI HEAD WITH AND WITHOUT CONTRAST: Negative for metastatic disease to the brain or calvarium.   06/03/2021 - 06/24/2021 Chemotherapy   Patient is on Treatment Plan : BREAST Trastuzumab q21d X 11 Cycles     06/25/2021 - 01/01/2022 Chemotherapy   Patient is on Treatment Plan : BREAST Docetaxel + Trastuzumab + Pertuzumab (THP) q21d     06/25/2021 - 01/02/2022 Chemotherapy   Patient is on Treatment Plan : BREAST Docetaxel + Trastuzumab +  Pertuzumab (THP) q21d     09/14/2021 Imaging   CT chest/abdomen/pelvis  IMPRESSION:  1. Interval resolution of previously seen bulky left axillary and  subpectoral lymphadenopathy. No persistently enlarged lymph nodes.  2. Multiple liver lesions are significantly diminished in size.  3. Widespread osseous  metastatic disease, with an interval increase  in sclerosis of several previously lytic lesions.  4. Constellation of findings is consistent with treatment response  of metastatic disease  5. New, although age indeterminate pathologic wedge deformity of the  T6 vertebral body as well as an increased pathologic wedge deformity  of the L2 vertebral body.  6. Coronary artery disease.  Aortic Atherosclerosis (ICD10-I70.0).    01/26/2022 -  Chemotherapy   Patient is on Treatment Plan : BREAST METASTATIC Fam-Trastuzumab Deruxtecan-nxki (Enhertu) (5.4) q21d     Malignant neoplasm metastatic to bone ([name])  04/17/2021 Imaging   CT ABDOMEN AND PELVIS WITH CONTRAST: -New lytic lesion involving the L2 vertebral body (6:72, 2:26) with minimal cortical breakthrough superiorly, but with preservation of vertebral body height, concerning for metastatic disease.  -There are several indeterminate hepatic lesions as described above. In addition to the lesions described above, there is an additional subtle 1.5 cm hypodensity in the hepatic dome (2:7). Given clinical history and presence of a new L2 lesion, leading differential consideration is metastatic disease. Recommend further evaluation with MRI of the abdomen with and without contrast.   05/12/2021 Imaging   MRI ABDOMEN WITH AND WITHOUT CONTRAST: Numerous heterogeneously enhancing, internally necrotic lesions in the right hepatic lobe, highly concerning for metastatic disease. Largest lesion measures up to 6.7 cm in hepatic segment VI.   Enhancing osseous lesions in the L2 vertebral body, both iliac bones, and in the sacrum, highly concerning for osseous  metastases.   05/15/2021 Initial Diagnosis   Bone metastases ([name])   05/27/2021 PET scan   1. Widespread recurrent/metastatic disease in this patient who is  status post bilateral mastectomy. Left chest wall recurrence with  left axillary/subpectoral, supraclavicular nodal metastasis.  2. Hepatic, left adrenal, and widespread osseous metastasis.  3. Incidental findings, including: Right nephrolithiasis. Tiny  hiatal hernia.    05/27/2021 Imaging   MRI LUMBAR AND THORACIC SPINE: Thoracic spine:  Osseous metastatic disease involving each level. The most dramatic deposits are at T4 and T6 where there is extraosseous/epidural tumor. No cord compression but there is foraminal impingement on the left at T6-7 and on the right at T3-4. Early dorsal epidural tumor at the level of T10 and T3.   Lumbar spine:  1. Widespread osseous metastatic disease with largest deposit  replacing the L2 body where there is a compression fracture with  mild height loss. Also at this level is early epidural tumor  extension likely affecting both L2-3 foramina.  2. 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    05/27/2021 Imaging   MRI HEAD WITH AND WITHOUT CONTRAST: Negative for metastatic disease to the brain or calvarium.   06/03/2021 - 06/24/2021 Chemotherapy   Patient is on Treatment Plan : BREAST Trastuzumab q21d X 11 Cycles     06/25/2021 - 01/01/2022 Chemotherapy   Patient is on Treatment Plan : BREAST Docetaxel + Trastuzumab + Pertuzumab (THP) q21d     06/25/2021 - 01/02/2022 Chemotherapy   Patient is on Treatment Plan : BREAST Docetaxel + Trastuzumab + Pertuzumab (THP) q21d     09/14/2021 Imaging   CT chest/abdomen/pelvis  IMPRESSION:  1. Interval resolution of previously seen bulky left axillary and  subpectoral lymphadenopathy. No persistently enlarged lymph nodes.  2. Multiple liver lesions are significantly diminished in size.  3. Widespread osseous metastatic disease,  with an interval increase  in sclerosis of several previously lytic lesions.  4. Constellation of findings is consistent with treatment response  of metastatic  disease  5. New, although age indeterminate pathologic wedge deformity of the  T6 vertebral body as well as an increased pathologic wedge deformity  of the L2 vertebral body.  6. Coronary artery disease.  Aortic Atherosclerosis (ICD10-I70.0).    01/26/2022 -  Chemotherapy   Patient is on Treatment Plan : BREAST METASTATIC Fam-Trastuzumab Deruxtecan-nxki (Enhertu) (5.4) q21d       REVIEW OF SYSTEMS:   Review of Systems  Constitutional: Negative.  Negative for appetite change, chills, diaphoresis, fatigue, fever and unexpected weight change.  HENT:  Negative.  Negative for hearing loss, lump/mass, mouth sores, nosebleeds, sore throat, tinnitus, trouble swallowing and voice change.   Eyes:  Positive for eye problems (tearing of the eye). Negative for icterus.  Respiratory: Negative.  Negative for chest tightness, cough, hemoptysis, shortness of breath and wheezing.   Cardiovascular: Negative.  Negative for chest pain, leg swelling and palpitations.  Gastrointestinal:  Negative for abdominal distention, abdominal pain, blood in stool, constipation, diarrhea, nausea, rectal pain and vomiting.  Endocrine: Negative.   Genitourinary: Negative.  Negative for bladder incontinence, difficulty urinating, dyspareunia, dysuria, frequency, hematuria, menstrual problem, nocturia, pelvic pain, vaginal bleeding and vaginal discharge.   Musculoskeletal:  Negative for arthralgias, back pain, flank pain, gait problem, myalgias, neck pain and neck stiffness.       Chronic left hand/nerve pain  Skin: Negative.  Negative for itching, rash and wound.  Neurological:  Positive for extremity weakness (of her left hand) and numbness (severe paresthesias inability to use her left hand). Negative for dizziness, gait problem, headaches, light-headedness, seizures  and speech difficulty.  Hematological: Negative.  Negative for adenopathy. Does not bruise/bleed easily.  Psychiatric/Behavioral: Negative.  Negative for confusion, decreased concentration, depression, sleep disturbance and suicidal ideas. The patient is not nervous/anxious.    VITALS:  Blood pressure (!) 156/61, pulse 69, temperature 97.8 F (36.6 C), temperature source Oral, resp. rate 18, height 5' 0.25" (1.53 m), weight 109 lb 3.2 oz (49.5 kg), SpO2 98%.  Wt Readings from Last 3 Encounters:  12/28/22 107 lb (48.5 kg)  12/23/22 109 lb 3.2 oz (49.5 kg)  12/07/22 108 lb 0.6 oz (49 kg)    Body mass index is 21.15 kg/m.  Performance status (ECOG): 1 - Symptomatic but completely ambulatory  PHYSICAL EXAM:  Physical Exam Vitals and nursing note reviewed. Exam conducted with a chaperone present.  Constitutional:      General: She is not in acute distress.    Appearance: Normal appearance. She is normal weight. She is not ill-appearing, toxic-appearing or diaphoretic.  HENT:     Head: Normocephalic and atraumatic.     Right Ear: Tympanic membrane, ear canal and external ear normal. There is no impacted cerumen.     Left Ear: Tympanic membrane, ear canal and external ear normal. There is no impacted cerumen.     Nose: Nose normal. No congestion or rhinorrhea.     Mouth/Throat:     Mouth: Mucous membranes are moist.     Pharynx: Oropharynx is clear. No oropharyngeal exudate or posterior oropharyngeal erythema.  Eyes:     General: No scleral icterus.       Right eye: No discharge.        Left eye: No discharge.     Extraocular Movements: Extraocular movements intact.     Conjunctiva/sclera: Conjunctivae normal.     Pupils: Pupils are equal, round, and reactive to light.  Neck:     Vascular: No carotid bruit.  Cardiovascular:  Rate and Rhythm: Normal rate and regular rhythm.     Pulses: Normal pulses.     Heart sounds: Normal heart sounds. No murmur heard.    No friction rub. No  gallop.  Pulmonary:     Effort: Pulmonary effort is normal. No respiratory distress.     Breath sounds: Normal breath sounds. No stridor. No wheezing, rhonchi or rales.  Chest:     Chest wall: No tenderness.  Breasts:    Right: Absent.     Left: Absent.     Comments: Left mastectomy is negative. Right breast is without masses In the medial left upper chest there are a couple areas of erythema and irritation of the skin with one small nodule in the left anterior axilla with some erythema above that.   Abdominal:     General: Bowel sounds are normal. There is no distension.     Palpations: Abdomen is soft. There is no mass.     Tenderness: There is no abdominal tenderness. There is no right CVA tenderness, left CVA tenderness, guarding or rebound.     Hernia: No hernia is present.     Comments: A couple red spots in the upper abdomen  Musculoskeletal:        General: No swelling, tenderness, deformity or signs of injury. Normal range of motion.     Left upper arm: Edema present.     Left forearm: Edema present.     Cervical back: Normal range of motion and neck supple. No rigidity or tenderness.     Right lower leg: No edema.     Left lower leg: No edema.     Comments: 1+ lymphedema of the left upper extremity   Lymphadenopathy:     Cervical: No cervical adenopathy.  Skin:    General: Skin is warm and dry.     Coloration: Skin is not jaundiced or pale.     Findings: No bruising, erythema (mild), lesion or rash.  Neurological:     Mental Status: She is alert and oriented to person, place, and time. Mental status is at baseline.     Cranial Nerves: No cranial nerve deficit.     Sensory: No sensory deficit.     Motor: Weakness (She has severe paresthesias of the left upper extremity with inability to use her left hand) present.     Coordination: Coordination normal.     Gait: Gait normal.     Deep Tendon Reflexes: Reflexes normal.  Psychiatric:        Mood and Affect: Mood  normal.        Behavior: Behavior normal.        Thought Content: Thought content normal.        Judgment: Judgment normal.    LABORATORY DATA:  I have reviewed the data as listed    Component Value Date/Time   NA 139 12/23/2022 0000   NA 137 11/13/2012 1057   K 4.3 12/23/2022 0000   K 4.5 11/13/2012 1057   CL 108 12/23/2022 0000   CO2 26 (A) 12/23/2022 0000   CO2 26 11/13/2012 1057   GLUCOSE 101 (H) 09/30/2022 1357   GLUCOSE 90 11/13/2012 1057   BUN 20 12/23/2022 0000   BUN 18.3 11/13/2012 1057   CREATININE 0.7 12/23/2022 0000   CREATININE 0.76 09/30/2022 1357   CREATININE 0.8 11/13/2012 1057   CALCIUM 10.1 12/23/2022 0000   CALCIUM 9.7 11/13/2012 1057   PROT 6.6 09/30/2022 1357   PROT 7.4 11/13/2012  1057   ALBUMIN 4.1 12/23/2022 0000   ALBUMIN 3.9 11/13/2012 1057   AST 48 (A) 12/23/2022 0000   AST 36 09/30/2022 1357   AST 22 11/13/2012 1057   ALT 19 12/23/2022 0000   ALT 17 09/30/2022 1357   ALT 15 11/13/2012 1057   ALKPHOS 61 12/23/2022 0000   ALKPHOS 77 11/13/2012 1057   BILITOT 0.3 09/30/2022 1357   BILITOT 0.54 11/13/2012 1057   GFRNONAA >60 09/30/2022 1357   GFRNONAA >60 09/09/2022 0000   GFRAA >60 04/25/2019 1045   No results found for: "SPEP", "UPEP"  Lab Results  Component Value Date   WBC 4.9 12/23/2022   NEUTROABS 2.65 12/23/2022   HGB 13.1 12/23/2022   HCT 38 12/23/2022   MCV 100 (A) 04/15/2022   PLT 165 12/23/2022     Chemistry      Component Value Date/Time   NA 139 12/23/2022 0000   NA 137 11/13/2012 1057   K 4.3 12/23/2022 0000   K 4.5 11/13/2012 1057   CL 108 12/23/2022 0000   CO2 26 (A) 12/23/2022 0000   CO2 26 11/13/2012 1057   BUN 20 12/23/2022 0000   BUN 18.3 11/13/2012 1057   CREATININE 0.7 12/23/2022 0000   CREATININE 0.76 09/30/2022 1357   CREATININE 0.8 11/13/2012 1057   GLU 106 12/23/2022 0000      Component Value Date/Time   CALCIUM 10.1 12/23/2022 0000   CALCIUM 9.7 11/13/2012 1057   ALKPHOS 61 12/23/2022 0000    ALKPHOS 77 11/13/2012 1057   AST 48 (A) 12/23/2022 0000   AST 36 09/30/2022 1357   AST 22 11/13/2012 1057   ALT 19 12/23/2022 0000   ALT 17 09/30/2022 1357   ALT 15 11/13/2012 1057   BILITOT 0.3 09/30/2022 1357   BILITOT 0.54 11/13/2012 1057     RADIOGRAPHIC STUDIES:      I,Jasmin Lewis,acting as a scribe for Jasmin Beckwith, MD.,have documented all relevant documentation on the behalf of Jasmin Beckwith, MD,as directed by  Jasmin Beckwith, MD while in the presence of Jasmin Beckwith, MD.

## 2022-12-27 MED FILL — Dexamethasone Sodium Phosphate Inj 100 MG/10ML: INTRAMUSCULAR | Qty: 1 | Status: AC

## 2022-12-28 ENCOUNTER — Inpatient Hospital Stay: Payer: Medicare Other

## 2022-12-28 VITALS — BP 157/70 | HR 76 | Temp 97.8°F | Resp 18 | Wt 107.0 lb

## 2022-12-28 DIAGNOSIS — Z85038 Personal history of other malignant neoplasm of large intestine: Secondary | ICD-10-CM | POA: Diagnosis not present

## 2022-12-28 DIAGNOSIS — Z5112 Encounter for antineoplastic immunotherapy: Secondary | ICD-10-CM | POA: Diagnosis present

## 2022-12-28 DIAGNOSIS — Z79899 Other long term (current) drug therapy: Secondary | ICD-10-CM | POA: Diagnosis not present

## 2022-12-28 DIAGNOSIS — C787 Secondary malignant neoplasm of liver and intrahepatic bile duct: Secondary | ICD-10-CM | POA: Diagnosis present

## 2022-12-28 DIAGNOSIS — C7951 Secondary malignant neoplasm of bone: Secondary | ICD-10-CM | POA: Diagnosis not present

## 2022-12-28 DIAGNOSIS — C50412 Malignant neoplasm of upper-outer quadrant of left female breast: Secondary | ICD-10-CM

## 2022-12-28 DIAGNOSIS — Z171 Estrogen receptor negative status [ER-]: Secondary | ICD-10-CM | POA: Diagnosis not present

## 2022-12-28 MED ORDER — PALONOSETRON HCL INJECTION 0.25 MG/5ML
0.2500 mg | Freq: Once | INTRAVENOUS | Status: AC
  Administered 2022-12-28: 0.25 mg via INTRAVENOUS
  Filled 2022-12-28: qty 5

## 2022-12-28 MED ORDER — FAM-TRASTUZUMAB DERUXTECAN-NXKI CHEMO 100 MG IV SOLR
4.0500 mg/kg | Freq: Once | INTRAVENOUS | Status: AC
  Administered 2022-12-28: 200 mg via INTRAVENOUS
  Filled 2022-12-28: qty 10

## 2022-12-28 MED ORDER — ACETAMINOPHEN 325 MG PO TABS
650.0000 mg | ORAL_TABLET | Freq: Once | ORAL | Status: AC
  Administered 2022-12-28: 650 mg via ORAL
  Filled 2022-12-28: qty 2

## 2022-12-28 MED ORDER — SODIUM CHLORIDE 0.9 % IV SOLN
10.0000 mg | Freq: Once | INTRAVENOUS | Status: AC
  Administered 2022-12-28: 10 mg via INTRAVENOUS
  Filled 2022-12-28: qty 10

## 2022-12-28 MED ORDER — DEXTROSE 5 % IV SOLN: Freq: Once | INTRAVENOUS | Status: AC

## 2022-12-28 MED ORDER — SODIUM CHLORIDE 0.9% FLUSH
10.0000 mL | INTRAVENOUS | Status: DC | PRN
  Administered 2022-12-28: 10 mL

## 2022-12-28 MED ORDER — HEPARIN SOD (PORK) LOCK FLUSH 100 UNIT/ML IV SOLN
500.0000 [IU] | Freq: Once | INTRAVENOUS | Status: AC | PRN
  Administered 2022-12-28: 500 [IU]

## 2022-12-28 MED ORDER — DIPHENHYDRAMINE HCL 25 MG PO CAPS
25.0000 mg | ORAL_CAPSULE | Freq: Once | ORAL | Status: AC
  Administered 2022-12-28: 25 mg via ORAL
  Filled 2022-12-28: qty 1

## 2022-12-28 NOTE — Patient Instructions (Signed)
Fam-Trastuzumab Deruxtecan Injection What is this medication? FAM-TRASTUZUMAB DERUXTECAN (fam-tras TOOZ eu mab DER ux TEE kan) treats some types of cancer. It works by blocking a protein that causes cancer cells to grow and multiply. This helps to slow or stop the spread of cancer cells. This medicine may be used for other purposes; ask your health care provider or pharmacist if you have questions. COMMON BRAND NAME(S): ENHERTU What should I tell my care team before I take this medication? They need to know if you have any of these conditions: Heart disease Heart failure Infection, especially a viral infection, such as chickenpox, cold sores, or herpes Liver disease Lung or breathing disease, such as asthma or COPD An unusual or allergic reaction to fam-trastuzumab deruxtecan, other medications, foods, dyes, or preservatives Pregnant or trying to get pregnant Breast-feeding How should I use this medication? This medication is injected into a vein. It is given by your care team in a hospital or clinic setting. A special MedGuide will be given to you before each treatment. Be sure to read this information carefully each time. Talk to your care team about the use of this medication in children. Special care may be needed. Overdosage: If you think you have taken too much of this medicine contact a poison control center or emergency room at once. NOTE: This medicine is only for you. Do not share this medicine with others. What if I miss a dose? It is important not to miss your dose. Call your care team if you are unable to keep an appointment. What may interact with this medication? Interactions are not expected. This list may not describe all possible interactions. Give your health care provider a list of all the medicines, herbs, non-prescription drugs, or dietary supplements you use. Also tell them if you smoke, drink alcohol, or use illegal drugs. Some items may interact with your  medicine. What should I watch for while using this medication? Visit your care team for regular checks on your progress. Tell your care team if your symptoms do not start to get better or if they get worse. Your condition will be monitored carefully while you are receiving this medication. Do not become pregnant while taking this medication or for 7 months after stopping it. Women should inform their care team if they wish to become pregnant or think they might be pregnant. Men should not father a child while taking this medication and for 4 months after stopping it. There is potential for serious side effects to an unborn child. Talk to your care team for more information. Do not breast-feed an infant while taking this medication or for 7 months after the last dose. This medication has caused decreased sperm counts in some men. This may make it more difficult to father a child. Talk to your care team if you are concerned about your fertility. This medication may increase your risk to bruise or bleed. Call your care team if you notice any unusual bleeding. Be careful brushing or flossing your teeth or using a toothpick because you may get an infection or bleed more easily. If you have any dental work done, tell your dentist you are receiving this medication. This medication may cause dry eyes and blurred vision. If you wear contact lenses, you may feel some discomfort. Lubricating eye drops may help. See your care team if the problem does not go away or is severe. This medication may increase your risk of getting an infection. Call your care team for   advice if you get a fever, chills, sore throat, or other symptoms of a cold or flu. Do not treat yourself. Try to avoid being around people who are sick. Avoid taking medications that contain aspirin, acetaminophen, ibuprofen, naproxen, or ketoprofen unless instructed by your care team. These medications may hide a fever. What side effects may I notice from  receiving this medication? Side effects that you should report to your care team as soon as possible: Allergic reactions--skin rash, itching, hives, swelling of the face, lips, tongue, or throat Dry cough, shortness of breath or trouble breathing Infection--fever, chills, cough, sore throat, wounds that don't heal, pain or trouble when passing urine, general feeling of discomfort or being unwell Heart failure--shortness of breath, swelling of the ankles, feet, or hands, sudden weight gain, unusual weakness or fatigue Unusual bruising or bleeding Side effects that usually do not require medical attention (report these to your care team if they continue or are bothersome): Constipation Diarrhea Hair loss Muscle pain Nausea Vomiting This list may not describe all possible side effects. Call your doctor for medical advice about side effects. You may report side effects to FDA at 1-800-FDA-1088. Where should I keep my medication? This medication is given in a hospital or clinic. It will not be stored at home. NOTE: This sheet is a summary. It may not cover all possible information. If you have questions about this medicine, talk to your doctor, pharmacist, or health care provider.  2024 [employer] (2021-02-10 00:00:00)  

## 2022-12-28 NOTE — Progress Notes (Signed)
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

## 2023-01-11 ENCOUNTER — Encounter: Payer: Self-pay | Admitting: Oncology

## 2023-01-12 NOTE — Progress Notes (Signed)
Patient Care Team: Jasmin Giovanni, MD as PCP - General (Family Medicine) Jasmin Beckwith, MD as Consulting Physician (Oncology)  Clinic Day:01/13/23  Referring physician: [name], MD  ASSESSMENT & PLAN:  Assessment & Plan: Malignant neoplasm of upper-outer quadrant of left breast in female, estrogen receptor negative ([name]) Stage IIB (T2c N1 M0) HER2 receptor positive left breast cancer, diagnosed in June 2020. This was ER/PR negative. This was treated with neoadjuvant Kadcyla/Perjeta and left mastectomy, with a complete response in breast and nodes. She had only received two doses of adjuvant Herceptin before being discontinued in December 2020.   History of colon cancer, stage II History of stage II colon cancer, diagnosed in May 2007. This was treated with surgical resection and adjuvant chemotherapy  with Xeloda. Colonoscopy from 2013 revealed 2 polyps which were resected and chronic diverticulosis.   Malignant neoplasm metastatic to liver [facility]) Liver metastases discovered 04/17/21.  Numerous heterogeneously enhancing, internally necrotic lesions in the right hepatic lobe, consistent with metastatic disease. Largest lesion measures up to 6.7 cm and there are at least 6 lesions. We now have biopsy proven HER2 positive recurrent breast cancer with ER/PR negative. She received THP and tolerated it without significant difficulty.  CT scan showed excellent response with significant decrease in the size of her liver lesions. She later had progression of this disease and was switched to Enhertu. Her CT of chest, abdomen, and pelvis on 07/27/2022 looked good with stable liver lesions which are subtle and at least 1 lesion is no longer visualized.   Malignant neoplasm metastatic to bone [facility]) Bone metastases discovered 04/17/21. Enhancing osseous lesions are present in the L2 vertebral body, both iliac bones, and in the sacrum, highly concerning for osseous metastases. She received her 1st  dose of zoledronic acid on January 16th, 2023.  I once again today explained to her and her son the reasoning for this medicine.  Her widespread bone metastases show an interval increase in sclerosis of previously lytic lesions consistent with treatment response. She still has extensive sclerotic metastasis of the bone but these are stable with stable compression of T6 and L2.    Chest wall recurrence of breast cancer, left ([name]) Left supraclavicular lymph node measuring 2-3 cm. Also involvement of subpectoral node and chest wall skin. This was rapidly progressive despite receiving  trastuzumab.  We have added in Perjeta and Taxotere to her current regimen and this has been very effective.  The CT scan also shows good response of her adenopathy with resolution of the previous bulky left axillary and subpectoral adenopathy. In September of 2023, she had a 2 cm nodule  located close to the left shoulder associated with nodularity of the left upper chest wall and appeared to have obvious progression of disease again. She was changed to [facility] and is responding well. The nodules have completely resolved.    Lymphedema She does have lymphedema of the left arm; however, this is better today, but with significant edema of the hand as well. We referred her to a lymphedema specialist in [place]. She sleeps with the hand and arm elevated.She has decreased use of her left hand but I think this is more than just the lymphedema.She has seen the neurologist for the nerve conduction velocities on her hand in March.    Plan: She informed me last time that she had some pink raised areas on her leg and chest that have now resolved. Her day 1 cycle 18 of Enhertu is scheduled on  12/28/2022. She now has an appointment at [facility] neurology on 01/25/2023 and on 03/04/2023. She will see Dr. Nedra Hai next Monday. Her WBC is 4.9, hemoglobin is 12.7, and platelet count is 136,000 as of today. Her CMP is nearly normal other than an  elevated AST of 41. I will see her back in 3 weeks with CBC and CMP. The patient and her son understand the plans discussed today and are in agreement with them.  She knows to contact our office if she develops concerns prior to her next appointment.  I provided 25 minutes of face-to-face time during this this encounter and > 50% was spent counseling as documented under my assessment and plan.    Jasmin Beckwith, MD  [facility] AT [facility] [address] [place] [address] Dept: [phone] Dept Fax: [phone]   CHIEF COMPLAINT:  CC: An 89 year old female with metastatic breast cancer   Current Treatment:  Enhertu  INTERVAL HISTORY:  Jasmin Lewis is here today for repeat clinical assessment for metastatic breast cancer. She has been on Enhertu since September of 2023 and is tolerating it well. She has been on zoledronic acid since January of 2023, every 6 weeks. Patient states that she feels well and her left hand still bothers her. She has appointments with the neurologist coming up.  Last time she had some pink raised areas of the left chest wall and arm but these have now resolved.. She denies signs of infection such as sore throat, sinus drainage, cough, or urinary symptoms.  She denies fevers or recurrent chills. She denies pain. She denies nausea, vomiting, chest pain, dyspnea or cough. Her appetite is up and down, and her weight has decreased 1 pounds over last 3 weeks . Her WBC is 4.9, hemoglobin is 12.7, and platelet count is 136,000 as of today. Her CMP is nearly normal other than an elevated AST of 41. She will have cycle 18 of Enhertu.  I will see her back in 3 weeks with CBC and CMP. She denies signs of infection such as sore throat, sinus drainage, cough, or urinary symptoms.  Her son accompanies her as usual.   I have reviewed the past medical history, past surgical history, social history and family history with the patient  and they are unchanged from previous note.  ALLERGIES:  has No Known Allergies.  MEDICATIONS:  Current Outpatient Medications  Medication Sig Dispense Refill   Biotin 1 MG CAPS Take by mouth.     fish oil-omega-3 fatty acids 1000 MG capsule Take 1 g by mouth daily.     gabapentin (NEURONTIN) 100 MG capsule Take 1 capsule (100 mg total) by mouth at bedtime. (Patient not taking: Reported on 03/05/2022) 30 capsule 5   magnesium citrate SOLN Take 0.5 Bottles by mouth as needed for severe constipation.     Multiple Vitamin (MULTIVITAMIN) capsule Take 1 capsule by mouth daily.     ondansetron (ZOFRAN) 4 MG tablet Take 1 tablet (4 mg total) by mouth every 8 (eight) hours as needed for nausea or vomiting. (Patient not taking: Reported on 03/05/2022) 20 tablet 2   polyethylene glycol (MIRALAX / GLYCOLAX) 17 g packet Take 17 g by mouth as needed. constipation     No current facility-administered medications for this visit.   HISTORY OF PRESENT ILLNESS:   Oncology History Overview Note  Cancer Staging Malignant neoplasm of upper-outer quadrant of left breast in female, estrogen receptor negative ([name]) Staging form: Breast,  AJCC 8th Edition - Clinical stage from 10/11/2018: Stage IIB (cT2, cN1, cM0, G3, ER-, PR-, HER2+) - Signed by Malachy Mood, MD on 11/02/2018 - Clinical: No stage assigned - Unsigned    History of colon cancer, stage II  09/2005 Initial Diagnosis   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 Findings were a 5.9 x 3.8 x 1.2 cm moderate to well-differentiated adenocarcinoma penetrating the muscular wall. Forty lymph nodes negative. No vascular or lymphatic invasion. Multiple diverticula with microabscess formation and inflammation. Carcinoma present in at least 1 diverticulum. Preop CEA 5.2 with lab normal range 0 to 2.5. Preop CT scan with no obvious additional pathology.    2007 -   Chemotherapy   She received oral Xeloda chemotherapy as an adjuvant. She declined treatment on a clinical trial.    11/12/2011 Initial Diagnosis   [name], stage II   12/09/2011 Procedure   Followup colonoscopy done on 12/09/2011. She was found to have 2 polyps which were removed. Chronic diverticulosis.     Malignant neoplasm of upper-outer quadrant of left breast in female, estrogen receptor negative ([name])  09/26/2018 Mammogram   Mammogram/US of left breast 09/26/18 IMPRESSION:  1. 2.9cm irregular mass in the upper outer left breast corresponds to the palpable abnormality. This is highly suspicious for breast carcinoma.  2. Two adjacent abnormal left axillary  Lymph nodes suspicious for metastatic adenopathy. There is a third borderline abnormal left axillary LN with a cortex thickened to 4mm.  3. Benign right breast cyst. No evidence of right breast malignancy.    10/11/2018 Cancer Staging   Staging form: Breast, AJCC 8th Edition - Clinical stage from 10/11/2018: Stage IIB (cT2, cN1, cM0, G3, ER-, PR-, HER2+) - Signed by Malachy Mood, MD on 11/02/2018   10/11/2018 Initial Biopsy   Diagnosis 10/11/18 1. Breast, left, needle core biopsy, upper outer left 2 o'clock - INVASIVE DUCTAL CARCINOMA. - DUCTAL CARCINOMA IN SITU. - LYMPHOVASCULAR INVASION IS IDENTIFIED. - SEE COMMENT. 2. Lymph node, needle/core biopsy, left axilla - INVASIVE DUCTAL CARCINOMA. - SEE COMMENT.   10/11/2018 Receptors her2   The tumor cells are POSITIVE for Her2 (3+). Estrogen Receptor: 0%, NEGATIVE Progesterone Receptor: 0%, NEGATIVE Proliferation Marker Ki67: 70%   11/02/2018 Initial Diagnosis   Malignant neoplasm of upper-outer quadrant of left breast in female, estrogen receptor negative ([name])   11/15/2018 Breast MRI   MRI breast 11/15/18  IMPRESSION: 1. 3.3 centimeter mass in the LATERAL portion of the LEFT breast consistent with known malignancy. 2. There is significant non mass enhancement surrounding this  mass and extending anteriorly into the nipple base, with largest diameter in the anterior to posterior axis, measuring 7.2 centimeters. 3. If the patient would consider breast conservation, additional MR guided core biopsies are recommended. Consider biopsy of the inferior and anterior extent of the non mass enhancement to document extent of disease. 4. Three enlarged LEFT axillary lymph nodes. 5. RIGHT breast is negative.   11/15/2018 PET scan   PET 11/15/18 IMPRESSION: Hypermetabolic left breast lesion compatible with known primary. Hypermetabolic left axillary lymph nodes are consistent with metastatic disease.   No evidence for additional hypermetabolic metastatic involvement in the neck, chest, abdomen, or pelvis.   11/17/2018 - 03/01/2019 Chemotherapy   Neo-adjuvant Kadcyla and perjeta q3weeks for 6 cycles starting 11/17/18. Stopped before surgery    11/29/2018 Pathology Results   Diagnosis 1. Breast, left, needle core biopsy,  inferior anterior (cylinder clip) - INVASIVE DUCTAL CARCINOMA. - DUCTAL CARCINOMA IN SITU. - LYMPHOVASCULAR INVASION IS IDENTIFIED. - SEE COMMENT. 2. Breast, left, needle core biopsy, central posterior (barbell clip) - INVASIVE DUCTAL CARCINOMA. - LYMPHOVASCULAR INVASION IS IDENTIFIED.   02/12/2019 Breast MRI   IMPRESSION: 1. Complete resolution of previously identified enhancing mass and associated non mass enhancement within the left breast. This is consistent with excellent response to chemotherapy. No residual or suspicious findings are identified. 2. No MRI evidence of malignancy on the right. 3. Previously identified left axillary lymphadenopathy not definitively seen on today's study. However, this may be due to decreased field-of-view compared to prior study.     03/14/2019 Cancer Staging   Staging form: Breast, AJCC 8th Edition - Pathologic stage from 03/14/2019: pT0, pN0, cM0, GX, ER: Unknown, PR: Unknown, HER2: Not Assessed - Signed by  Malachy Mood, MD on 03/28/2019   03/14/2019 Surgery   LEFT MASTECTOMY WITH TARGETED LYMPH NODE DISSECTION and Left Axillary Sentinel Lymph  Node Biopsy by Dr Carolynne Edouard 03/14/19    03/14/2019 Pathology Results   FINAL MICROSCOPIC DIAGNOSIS:   A. LYMPH NODE, LEFT, SENTINEL, BIOPSY:  - There is no evidence of carcinoma in 1 of 1 lymph node (0/1).   B. BREAST, LEFT, MASTECTOMY:  - Benign breast parenchyma with treatment-related changes.  - There is no evidence of malignancy.  - See oncology table below.   C. LYMPH NODE, LEFT #1, SENTINEL, BIOPSY:  - There is no evidence of carcinoma in 1 of 1 lymph node (0/1).   D. LYMPH NODE, LEFT #2, SENTINEL, BIOPSY:  - There is no evidence of carcinoma in 1 of 1 lymph node (0/1).     04/04/2019 - 04/25/2019 Chemotherapy   Maintenance Herceptin injections every 3 weeks starting 04/03/19 to complete 1 year of treatment that was started in 11/2018. Stopped after 2nd dose as she will not be repeating Echos.    06/03/2021 - 06/24/2021 Chemotherapy   Patient is on Treatment Plan : BREAST Trastuzumab q21d X 11 Cycles     06/25/2021 - 01/01/2022 Chemotherapy   Patient is on Treatment Plan : BREAST Docetaxel + Trastuzumab + Pertuzumab (THP) q21d     06/25/2021 - 01/02/2022 Chemotherapy   Patient is on Treatment Plan : BREAST Docetaxel + Trastuzumab + Pertuzumab (THP) q21d     09/14/2021 Imaging   CT chest/abdomen/pelvis  IMPRESSION:  1. Interval resolution of previously seen bulky left axillary and  subpectoral lymphadenopathy. No persistently enlarged lymph nodes.  2. Multiple liver lesions are significantly diminished in size.  3. Widespread osseous metastatic disease, with an interval increase  in sclerosis of several previously lytic lesions.  4. Constellation of findings is consistent with treatment response  of metastatic disease  5. New, although age indeterminate pathologic wedge deformity of the  T6 vertebral body as well as an increased pathologic wedge  deformity  of the L2 vertebral body.  6. Coronary artery disease.  Aortic Atherosclerosis (ICD10-I70.0).    01/26/2022 -  Chemotherapy   Patient is on Treatment Plan : BREAST METASTATIC Fam-Trastuzumab Deruxtecan-nxki (Enhertu) (5.4) q21d     Malignant neoplasm metastatic to liver ([name])  04/17/2021 Imaging   CT ABDOMEN AND PELVIS WITH CONTRAST: -New lytic lesion involving the L2 vertebral body (6:72, 2:26) with minimal cortical breakthrough superiorly, but with preservation of vertebral body height, concerning for metastatic disease.  -There are several indeterminate hepatic lesions as described above. In addition to the lesions described above, there is an additional subtle  1.5 cm hypodensity in the hepatic dome (2:7). Given clinical history and presence of a new L2 lesion, leading differential consideration is metastatic disease. Recommend further evaluation with MRI of the abdomen with and without contrast.   05/12/2021 Imaging   MRI ABDOMEN WITH AND WITHOUT CONTRAST: Numerous heterogeneously enhancing, internally necrotic lesions in the right hepatic lobe, highly concerning for metastatic disease. Largest lesion measures up to 6.7 cm in hepatic segment VI.   Enhancing osseous lesions in the L2 vertebral body, both iliac bones, and in the sacrum, highly concerning for osseous metastases.   05/15/2021 Initial Diagnosis   Liver metastases ([name])   05/27/2021 PET scan   1. Widespread recurrent/metastatic disease in this patient who is  status post bilateral mastectomy. Left chest wall recurrence with  left axillary/subpectoral, supraclavicular nodal metastasis.  2. Hepatic, left adrenal, and widespread osseous metastasis.  3. Incidental findings, including: Right nephrolithiasis. Tiny  hiatal hernia.    05/27/2021 Imaging   MRI LUMBAR AND THORACIC SPINE: Thoracic spine:  Osseous metastatic disease involving each level. The most dramatic deposits are at T4 and T6 where there is  extraosseous/epidural tumor. No cord compression but there is foraminal impingement on the left at T6-7 and on the right at T3-4. Early dorsal epidural tumor at the level of T10 and T3.   Lumbar spine:  1. Widespread osseous metastatic disease with largest deposit  replacing the L2 body where there is a compression fracture with  mild height loss. Also at this level is early epidural tumor  extension likely affecting both L2-3 foramina.  2. 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    05/27/2021 Imaging   MRI HEAD WITH AND WITHOUT CONTRAST: Negative for metastatic disease to the brain or calvarium.   06/03/2021 - 06/24/2021 Chemotherapy   Patient is on Treatment Plan : BREAST Trastuzumab q21d X 11 Cycles     06/25/2021 - 01/01/2022 Chemotherapy   Patient is on Treatment Plan : BREAST Docetaxel + Trastuzumab + Pertuzumab (THP) q21d     06/25/2021 - 01/02/2022 Chemotherapy   Patient is on Treatment Plan : BREAST Docetaxel + Trastuzumab + Pertuzumab (THP) q21d     09/14/2021 Imaging   CT chest/abdomen/pelvis  IMPRESSION:  1. Interval resolution of previously seen bulky left axillary and  subpectoral lymphadenopathy. No persistently enlarged lymph nodes.  2. Multiple liver lesions are significantly diminished in size.  3. Widespread osseous metastatic disease, with an interval increase  in sclerosis of several previously lytic lesions.  4. Constellation of findings is consistent with treatment response  of metastatic disease  5. New, although age indeterminate pathologic wedge deformity of the  T6 vertebral body as well as an increased pathologic wedge deformity  of the L2 vertebral body.  6. Coronary artery disease.  Aortic Atherosclerosis (ICD10-I70.0).    01/26/2022 -  Chemotherapy   Patient is on Treatment Plan : BREAST METASTATIC Fam-Trastuzumab Deruxtecan-nxki (Enhertu) (5.4) q21d     Malignant neoplasm metastatic to bone ([name])  04/17/2021 Imaging   CT  ABDOMEN AND PELVIS WITH CONTRAST: -New lytic lesion involving the L2 vertebral body (6:72, 2:26) with minimal cortical breakthrough superiorly, but with preservation of vertebral body height, concerning for metastatic disease.  -There are several indeterminate hepatic lesions as described above. In addition to the lesions described above, there is an additional subtle 1.5 cm hypodensity in the hepatic dome (2:7). Given clinical history and presence of a new L2 lesion, leading differential consideration is metastatic disease. Recommend further evaluation  with MRI of the abdomen with and without contrast.   05/12/2021 Imaging   MRI ABDOMEN WITH AND WITHOUT CONTRAST: Numerous heterogeneously enhancing, internally necrotic lesions in the right hepatic lobe, highly concerning for metastatic disease. Largest lesion measures up to 6.7 cm in hepatic segment VI.   Enhancing osseous lesions in the L2 vertebral body, both iliac bones, and in the sacrum, highly concerning for osseous metastases.   05/15/2021 Initial Diagnosis   Bone metastases ([name])   05/27/2021 PET scan   1. Widespread recurrent/metastatic disease in this patient who is  status post bilateral mastectomy. Left chest wall recurrence with  left axillary/subpectoral, supraclavicular nodal metastasis.  2. Hepatic, left adrenal, and widespread osseous metastasis.  3. Incidental findings, including: Right nephrolithiasis. Tiny  hiatal hernia.    05/27/2021 Imaging   MRI LUMBAR AND THORACIC SPINE: Thoracic spine:  Osseous metastatic disease involving each level. The most dramatic deposits are at T4 and T6 where there is extraosseous/epidural tumor. No cord compression but there is foraminal impingement on the left at T6-7 and on the right at T3-4. Early dorsal epidural tumor at the level of T10 and T3.   Lumbar spine:  1. Widespread osseous metastatic disease with largest deposit  replacing the L2 body where there is a compression fracture with   mild height loss. Also at this level is early epidural tumor  extension likely affecting both L2-3 foramina.  2. 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    05/27/2021 Imaging   MRI HEAD WITH AND WITHOUT CONTRAST: Negative for metastatic disease to the brain or calvarium.   06/03/2021 - 06/24/2021 Chemotherapy   Patient is on Treatment Plan : BREAST Trastuzumab q21d X 11 Cycles     06/25/2021 - 01/01/2022 Chemotherapy   Patient is on Treatment Plan : BREAST Docetaxel + Trastuzumab + Pertuzumab (THP) q21d     06/25/2021 - 01/02/2022 Chemotherapy   Patient is on Treatment Plan : BREAST Docetaxel + Trastuzumab + Pertuzumab (THP) q21d     09/14/2021 Imaging   CT chest/abdomen/pelvis  IMPRESSION:  1. Interval resolution of previously seen bulky left axillary and  subpectoral lymphadenopathy. No persistently enlarged lymph nodes.  2. Multiple liver lesions are significantly diminished in size.  3. Widespread osseous metastatic disease, with an interval increase  in sclerosis of several previously lytic lesions.  4. Constellation of findings is consistent with treatment response  of metastatic disease  5. New, although age indeterminate pathologic wedge deformity of the  T6 vertebral body as well as an increased pathologic wedge deformity  of the L2 vertebral body.  6. Coronary artery disease.  Aortic Atherosclerosis (ICD10-I70.0).    01/26/2022 -  Chemotherapy   Patient is on Treatment Plan : BREAST METASTATIC Fam-Trastuzumab Deruxtecan-nxki (Enhertu) (5.4) q21d       REVIEW OF SYSTEMS:   Review of Systems  Constitutional: Negative.  Negative for appetite change, chills, diaphoresis, fatigue, fever and unexpected weight change.  HENT:  Negative.  Negative for hearing loss, lump/mass, mouth sores, nosebleeds, sore throat, tinnitus, trouble swallowing and voice change.   Eyes:  Positive for eye problems (tearing of the eye). Negative for icterus.   Respiratory: Negative.  Negative for chest tightness, cough, hemoptysis, shortness of breath and wheezing.   Cardiovascular: Negative.  Negative for chest pain, leg swelling and palpitations.  Gastrointestinal:  Negative for abdominal distention, abdominal pain, blood in stool, constipation, diarrhea, nausea, rectal pain and vomiting.  Endocrine: Negative.   Genitourinary: Negative.  Negative for  bladder incontinence, difficulty urinating, dyspareunia, dysuria, frequency, hematuria, menstrual problem, nocturia, pelvic pain, vaginal bleeding and vaginal discharge.   Musculoskeletal:  Negative for arthralgias, back pain, flank pain, gait problem, myalgias, neck pain and neck stiffness.       Chronic left hand/nerve pain  Skin: Negative.  Negative for itching, rash and wound.  Neurological:  Positive for extremity weakness (of her left hand) and numbness (severe paresthesias inability to use her left hand). Negative for dizziness, gait problem, headaches, light-headedness, seizures and speech difficulty.  Hematological: Negative.  Negative for adenopathy. Does not bruise/bleed easily.  Psychiatric/Behavioral: Negative.  Negative for confusion, decreased concentration, depression, sleep disturbance and suicidal ideas. The patient is not nervous/anxious.    VITALS:  Blood pressure 138/68, pulse 71, temperature 98 F (36.7 C), temperature source Oral, resp. rate 18, height 5' 0.25" (1.53 m), weight 108 lb 6.4 oz (49.2 kg), SpO2 97%.  Wt Readings from Last 3 Encounters:  02/02/23 109 lb 4.8 oz (49.6 kg)  01/18/23 108 lb 0.6 oz (49 kg)  01/13/23 108 lb 6.4 oz (49.2 kg)    Body mass index is 21 kg/m.  Performance status (ECOG): 1 - Symptomatic but completely ambulatory  PHYSICAL EXAM:  Physical Exam Vitals and nursing note reviewed. Exam conducted with a chaperone present.  Constitutional:      General: She is not in acute distress.    Appearance: Normal appearance. She is normal weight. She  is not ill-appearing, toxic-appearing or diaphoretic.  HENT:     Head: Normocephalic and atraumatic.     Right Ear: Tympanic membrane, ear canal and external ear normal. There is no impacted cerumen.     Left Ear: Tympanic membrane, ear canal and external ear normal. There is no impacted cerumen.     Nose: Nose normal. No congestion or rhinorrhea.     Mouth/Throat:     Mouth: Mucous membranes are moist.     Pharynx: Oropharynx is clear. No oropharyngeal exudate or posterior oropharyngeal erythema.  Eyes:     General: No scleral icterus.       Right eye: No discharge.        Left eye: No discharge.     Extraocular Movements: Extraocular movements intact.     Conjunctiva/sclera: Conjunctivae normal.     Pupils: Pupils are equal, round, and reactive to light.  Neck:     Vascular: No carotid bruit.  Cardiovascular:     Rate and Rhythm: Normal rate and regular rhythm.     Pulses: Normal pulses.     Heart sounds: Normal heart sounds. No murmur heard.    No friction rub. No gallop.  Pulmonary:     Effort: Pulmonary effort is normal. No respiratory distress.     Breath sounds: Normal breath sounds. No stridor. No wheezing, rhonchi or rales.  Chest:     Chest wall: No tenderness.  Breasts:    Right: Absent.     Left: Absent.     Comments: Left mastectomy is negative. Right breast is without masses In the medial left upper chest there are a couple areas of erythema and irritation of the skin with one small nodule in the left anterior axilla with some erythema above that.   Abdominal:     General: Bowel sounds are normal. There is no distension.     Palpations: Abdomen is soft. There is no mass.     Tenderness: There is no abdominal tenderness. There is no right CVA tenderness, left CVA tenderness, guarding  or rebound.     Hernia: No hernia is present.     Comments: A couple red spots in the upper abdomen  Musculoskeletal:        General: No swelling, tenderness, deformity or signs of  injury. Normal range of motion.     Left upper arm: Edema present.     Left forearm: Edema present.     Cervical back: Normal range of motion and neck supple. No rigidity or tenderness.     Right lower leg: No edema.     Left lower leg: No edema.     Comments: 1+ lymphedema of the left upper extremity   Lymphadenopathy:     Cervical: No cervical adenopathy.  Skin:    General: Skin is warm and dry.     Coloration: Skin is not jaundiced or pale.     Findings: No bruising, erythema (mild), lesion or rash.  Neurological:     Mental Status: She is alert and oriented to person, place, and time. Mental status is at baseline.     Cranial Nerves: No cranial nerve deficit.     Sensory: No sensory deficit.     Motor: Weakness (She has severe paresthesias of the left upper extremity with inability to use her left hand) present.     Coordination: Coordination normal.     Gait: Gait normal.     Deep Tendon Reflexes: Reflexes normal.  Psychiatric:        Mood and Affect: Mood normal.        Behavior: Behavior normal.        Thought Content: Thought content normal.        Judgment: Judgment normal.    LABORATORY DATA:  I have reviewed the data as listed    Component Value Date/Time   NA 137 02/02/2023 1348   NA 138 01/13/2023 0000   NA 137 11/13/2012 1057   K 4.1 02/02/2023 1348   K 4.5 11/13/2012 1057   CL 106 02/02/2023 1348   CO2 24 02/02/2023 1348   CO2 26 11/13/2012 1057   GLUCOSE 93 02/02/2023 1348   GLUCOSE 90 11/13/2012 1057   BUN 23 02/02/2023 1348   BUN 19 01/13/2023 0000   BUN 18.3 11/13/2012 1057   CREATININE 0.71 02/02/2023 1348   CREATININE 0.8 11/13/2012 1057   CALCIUM 9.8 02/02/2023 1348   CALCIUM 9.7 11/13/2012 1057   PROT 6.5 02/02/2023 1348   PROT 7.4 11/13/2012 1057   ALBUMIN 3.7 02/02/2023 1348   ALBUMIN 3.9 11/13/2012 1057   AST 33 02/02/2023 1348   AST 22 11/13/2012 1057   ALT 18 02/02/2023 1348   ALT 15 11/13/2012 1057   ALKPHOS 54 02/02/2023 1348    ALKPHOS 77 11/13/2012 1057   BILITOT 0.5 02/02/2023 1348   BILITOT 0.54 11/13/2012 1057   GFRNONAA >60 02/02/2023 1348   GFRNONAA >60 09/09/2022 0000   GFRAA >60 04/25/2019 1045   No results found for: "SPEP", "UPEP"  Lab Results  Component Value Date   WBC 5.4 02/02/2023   NEUTROABS 3.2 02/02/2023   HGB 12.0 02/02/2023   HCT 35.5 (L) 02/02/2023   MCV 104.1 (H) 02/02/2023   PLT 132 (L) 02/02/2023     Chemistry      Component Value Date/Time   NA 137 02/02/2023 1348   NA 138 01/13/2023 0000   NA 137 11/13/2012 1057   K 4.1 02/02/2023 1348   K 4.5 11/13/2012 1057   CL 106 02/02/2023 1348  CO2 24 02/02/2023 1348   CO2 26 11/13/2012 1057   BUN 23 02/02/2023 1348   BUN 19 01/13/2023 0000   BUN 18.3 11/13/2012 1057   CREATININE 0.71 02/02/2023 1348   CREATININE 0.8 11/13/2012 1057   GLU 122 01/13/2023 0000      Component Value Date/Time   CALCIUM 9.8 02/02/2023 1348   CALCIUM 9.7 11/13/2012 1057   ALKPHOS 54 02/02/2023 1348   ALKPHOS 77 11/13/2012 1057   AST 33 02/02/2023 1348   AST 22 11/13/2012 1057   ALT 18 02/02/2023 1348   ALT 15 11/13/2012 1057   BILITOT 0.5 02/02/2023 1348   BILITOT 0.54 11/13/2012 1057     RADIOGRAPHIC STUDIES:      I,Jasmine M Lassiter,acting as a scribe for Jasmin Beckwith, MD.,have documented all relevant documentation on the behalf of Jasmin Beckwith, MD,as directed by  Jasmin Beckwith, MD while in the presence of Jasmin Beckwith, MD.

## 2023-01-13 ENCOUNTER — Encounter: Payer: Self-pay | Admitting: Oncology

## 2023-01-13 ENCOUNTER — Inpatient Hospital Stay: Payer: Medicare Other | Admitting: Oncology

## 2023-01-13 ENCOUNTER — Inpatient Hospital Stay: Payer: Medicare Other | Attending: Oncology

## 2023-01-13 VITALS — BP 138/68 | HR 71 | Temp 98.0°F | Resp 18 | Ht 60.25 in | Wt 108.4 lb

## 2023-01-13 DIAGNOSIS — Z171 Estrogen receptor negative status [ER-]: Secondary | ICD-10-CM

## 2023-01-13 DIAGNOSIS — Z79899 Other long term (current) drug therapy: Secondary | ICD-10-CM | POA: Insufficient documentation

## 2023-01-13 DIAGNOSIS — C50412 Malignant neoplasm of upper-outer quadrant of left female breast: Secondary | ICD-10-CM

## 2023-01-13 DIAGNOSIS — Z85038 Personal history of other malignant neoplasm of large intestine: Secondary | ICD-10-CM | POA: Insufficient documentation

## 2023-01-13 DIAGNOSIS — Z5112 Encounter for antineoplastic immunotherapy: Secondary | ICD-10-CM | POA: Insufficient documentation

## 2023-01-13 DIAGNOSIS — C787 Secondary malignant neoplasm of liver and intrahepatic bile duct: Secondary | ICD-10-CM | POA: Insufficient documentation

## 2023-01-13 DIAGNOSIS — C7951 Secondary malignant neoplasm of bone: Secondary | ICD-10-CM

## 2023-01-13 DIAGNOSIS — R7401 Elevation of levels of liver transaminase levels: Secondary | ICD-10-CM | POA: Insufficient documentation

## 2023-01-13 DIAGNOSIS — I972 Postmastectomy lymphedema syndrome: Secondary | ICD-10-CM | POA: Insufficient documentation

## 2023-01-13 DIAGNOSIS — Z9012 Acquired absence of left breast and nipple: Secondary | ICD-10-CM | POA: Insufficient documentation

## 2023-01-13 LAB — BASIC METABOLIC PANEL
BUN: 19 (ref 4–21)
CO2: 27 — AB (ref 13–22)
Chloride: 106 (ref 99–108)
Creatinine: 0.6 (ref 0.5–1.1)
EGFR: 60
Glucose: 122
Potassium: 3.9 mEq/L (ref 3.5–5.1)
Sodium: 138 (ref 137–147)

## 2023-01-13 LAB — COMPREHENSIVE METABOLIC PANEL
Albumin: 3.8 (ref 3.5–5.0)
Calcium: 9.7 (ref 8.7–10.7)

## 2023-01-13 LAB — HEPATIC FUNCTION PANEL
ALT: 19 U/L (ref 7–35)
AST: 41 — AB (ref 13–35)
Alkaline Phosphatase: 59 (ref 25–125)
Bilirubin, Total: 0.5

## 2023-01-13 LAB — CBC AND DIFFERENTIAL
HCT: 37 (ref 36–46)
Hemoglobin: 12.7 (ref 12.0–16.0)
Neutrophils Absolute: 2.65
Platelets: 136 10*3/uL — AB (ref 150–400)
WBC: 4.9

## 2023-01-13 LAB — CBC: RBC: 3.52 — AB (ref 3.87–5.11)

## 2023-01-14 ENCOUNTER — Encounter: Payer: Self-pay | Admitting: Oncology

## 2023-01-15 ENCOUNTER — Other Ambulatory Visit: Payer: Self-pay

## 2023-01-17 MED FILL — Dexamethasone Sodium Phosphate Inj 100 MG/10ML: INTRAMUSCULAR | Qty: 1 | Status: AC

## 2023-01-17 MED FILL — Zoledronic Acid Inj Conc For IV Infusion 4 MG/5ML: INTRAVENOUS | Qty: 3.75 | Status: AC

## 2023-01-18 ENCOUNTER — Encounter: Payer: Self-pay | Admitting: Oncology

## 2023-01-18 ENCOUNTER — Inpatient Hospital Stay: Payer: Medicare Other

## 2023-01-18 VITALS — BP 141/67 | HR 82 | Temp 98.5°F | Resp 16 | Ht 60.25 in | Wt 108.0 lb

## 2023-01-18 DIAGNOSIS — C787 Secondary malignant neoplasm of liver and intrahepatic bile duct: Secondary | ICD-10-CM

## 2023-01-18 DIAGNOSIS — Z79899 Other long term (current) drug therapy: Secondary | ICD-10-CM | POA: Diagnosis not present

## 2023-01-18 DIAGNOSIS — Z171 Estrogen receptor negative status [ER-]: Secondary | ICD-10-CM | POA: Diagnosis not present

## 2023-01-18 DIAGNOSIS — C7951 Secondary malignant neoplasm of bone: Secondary | ICD-10-CM

## 2023-01-18 DIAGNOSIS — Z85038 Personal history of other malignant neoplasm of large intestine: Secondary | ICD-10-CM | POA: Diagnosis not present

## 2023-01-18 DIAGNOSIS — C50412 Malignant neoplasm of upper-outer quadrant of left female breast: Secondary | ICD-10-CM | POA: Diagnosis present

## 2023-01-18 DIAGNOSIS — Z5112 Encounter for antineoplastic immunotherapy: Secondary | ICD-10-CM | POA: Diagnosis present

## 2023-01-18 DIAGNOSIS — Z9012 Acquired absence of left breast and nipple: Secondary | ICD-10-CM | POA: Diagnosis not present

## 2023-01-18 DIAGNOSIS — I972 Postmastectomy lymphedema syndrome: Secondary | ICD-10-CM | POA: Diagnosis not present

## 2023-01-18 DIAGNOSIS — R7401 Elevation of levels of liver transaminase levels: Secondary | ICD-10-CM | POA: Diagnosis not present

## 2023-01-18 MED ORDER — SODIUM CHLORIDE 0.9 % IV SOLN
10.0000 mg | Freq: Once | INTRAVENOUS | Status: AC
  Administered 2023-01-18: 10 mg via INTRAVENOUS
  Filled 2023-01-18: qty 10

## 2023-01-18 MED ORDER — FAM-TRASTUZUMAB DERUXTECAN-NXKI CHEMO 100 MG IV SOLR
4.0500 mg/kg | Freq: Once | INTRAVENOUS | Status: AC
  Administered 2023-01-18: 200 mg via INTRAVENOUS
  Filled 2023-01-18: qty 10

## 2023-01-18 MED ORDER — ACETAMINOPHEN 325 MG PO TABS
650.0000 mg | ORAL_TABLET | Freq: Once | ORAL | Status: AC
  Administered 2023-01-18: 650 mg via ORAL
  Filled 2023-01-18: qty 2

## 2023-01-18 MED ORDER — PALONOSETRON HCL INJECTION 0.25 MG/5ML
0.2500 mg | Freq: Once | INTRAVENOUS | Status: AC
  Administered 2023-01-18: 0.25 mg via INTRAVENOUS
  Filled 2023-01-18: qty 5

## 2023-01-18 MED ORDER — SODIUM CHLORIDE 0.9% FLUSH
10.0000 mL | INTRAVENOUS | Status: DC | PRN
  Administered 2023-01-18: 10 mL

## 2023-01-18 MED ORDER — ZOLEDRONIC ACID 4 MG/5ML IV CONC
3.0000 mg | Freq: Once | INTRAVENOUS | Status: AC
  Administered 2023-01-18: 3 mg via INTRAVENOUS
  Filled 2023-01-18: qty 3.75

## 2023-01-18 MED ORDER — DEXTROSE 5 % IV SOLN: Freq: Once | INTRAVENOUS | Status: AC

## 2023-01-18 MED ORDER — HEPARIN SOD (PORK) LOCK FLUSH 100 UNIT/ML IV SOLN
500.0000 [IU] | Freq: Once | INTRAVENOUS | Status: AC | PRN
  Administered 2023-01-18: 500 [IU]

## 2023-01-18 MED ORDER — DIPHENHYDRAMINE HCL 25 MG PO CAPS
25.0000 mg | ORAL_CAPSULE | Freq: Once | ORAL | Status: AC
  Administered 2023-01-18: 25 mg via ORAL
  Filled 2023-01-18: qty 1

## 2023-01-18 NOTE — Patient Instructions (Addendum)
Dehydration, Adult Dehydration is a condition in which there is not enough water or other fluids in the body. This happens when a person loses more fluids than they take in. Important organs cannot work right without the right amount of fluids. Any loss of fluids from the body can cause dehydration. Dehydration can be mild, worse, or very bad. It should be treated right away to keep it from getting very bad. What are the causes? Conditions that cause loss of water in the body. They include: Watery poop (diarrhea). Vomiting. Sweating a lot. Fever. Infection. Peeing (urinating) a lot. Not drinking enough fluids. Certain medicines, such as medicines that take extra fluid out of the body (diuretics). Lack of safe drinking water. Not being able to get enough water and food. What increases the risk? Having a long-term (chronic) illness that has not been treated the right way, such as: Diabetes. Heart disease. Kidney disease. Being 80 years of age or older. Having a disability. Living in a place that is high above the ground or sea (high in altitude). The thinner, drier air causes more fluid loss. Doing exercises that put stress on your body for a long time. Being active when in hot places. What are the signs or symptoms? Symptoms of dehydration depend on how bad it is. Mild or worse dehydration Thirst. Dry lips or dry mouth. Feeling dizzy or light-headed. Muscle cramps. Passing little pee or dark pee. Pee may be the color of tea. Headache. Very bad dehydration Changes in skin. Skin may: Be cold to the touch (clammy). Be blotchy or pale. Not go back to normal right after you pinch it and let it go. Little or no tears, pee, or sweat. Fast breathing. Low blood pressure. Weak pulse. Pulse that is more than 100 beats a minute when you are sitting still. Other changes, such as: Feeling very thirsty. Eyes that look hollow (sunken). Cold hands and feet. Being confused. Being very  tired (lethargic) or having trouble waking from sleep. Losing weight. Loss of consciousness. How is this treated? Treatment for this condition depends on how bad your dehydration is. Treatment should start right away. Do not wait until your condition gets very bad. Very bad dehydration is an emergency. You will need to go to a hospital. Mild or worse dehydration can be treated at home. You may be asked to: Drink more fluids. Drink an oral rehydration solution (ORS). This drink gives you the right amount of fluids, salts, and minerals (electrolytes). Very bad dehydration can be treated: With fluids through an IV tube. By correcting low levels of electrolytes in the body. By treating the problem that caused your dehydration. Follow these instructions at home: Oral rehydration solution If told by your doctor, drink an ORS: Make an ORS. Use instructions on the package. Start by drinking small amounts, about  cup (120 mL) every 5-10 minutes. Slowly drink more until you have had the amount that your doctor said to have.  Eating and drinking  Drink enough clear fluid to keep your pee pale yellow. If you were told to drink an ORS, finish the ORS first. Then, start slowly drinking other clear fluids. Drink fluids such as: Water. Do not drink only water. Doing that can make the salt (sodium) level in your body get too low. Water from ice chips you suck on. Fruit juice that you have added water to (diluted). Low-calorie sports drinks. Eat foods that have the right amounts of salts and minerals, such as bananas, oranges, potatoes,  tomatoes, or spinach. Do not drink alcohol. Avoid drinks that have caffeine or sugar. These include:: High-calorie sports drinks. Fruit juice that you did not add water to. Soda. Coffee or energy drinks. Avoid foods that are greasy or have a lot of fat or sugar. General instructions Take over-the-counter and prescription medicines only as told by your doctor. Do  not take sodium tablets. Doing that can make the salt level in your body get too high. Return to your normal activities as told by your doctor. Ask your doctor what activities are safe for you. Keep all follow-up visits. Your doctor may check and change your treatment. Contact a doctor if: You have pain in your belly (abdomen) and the pain: Gets worse. Stays in one place. You have a rash. You have a stiff neck. You get angry or annoyed more easily than normal. You are more tired or have a harder time waking than normal. You feel weak or dizzy. You feel very thirsty. Get help right away if: You have any symptoms of very bad dehydration. You vomit every time you eat or drink. Your vomiting gets worse, does not go away, or you vomit blood or green stuff. You are getting treatment, but symptoms are getting worse. You have a fever. You have a very bad headache. You have: Diarrhea that gets worse or does not go away. Blood in your poop (stool). This may cause poop to look black and tarry. No pee in 6-8 hours. Only a small amount of pee in 6-8 hours, and the pee is very dark. You have trouble breathing. These symptoms may be an emergency. Get help right away. Call 911. Do not wait to see if the symptoms will go away. Do not drive yourself to the hospital. This information is not intended to replace advice given to you by your health care provider. Make sure you discuss any questions you have with your health care provider. Document Revised: 11/23/2021 Document Reviewed: 11/23/2021 Elsevier Patient Education  2024 [employer].  [facility]  Discharge Instructions: Thank you for choosing [facility] to provide your oncology and hematology care.  If you have a lab appointment with the Cancer Center, please go directly to the Cancer Center and check in at the registration area.   Wear comfortable clothing and clothing appropriate for easy access to  any Portacath or PICC line.   We strive to give you quality time with your provider. You may need to reschedule your appointment if you arrive late (15 or more minutes).  Arriving late affects you and other patients whose appointments are after yours.  Also, if you miss three or more appointments without notifying the office, you may be dismissed from the clinic at the provider's discretion.      For prescription refill requests, have your pharmacy contact our office and allow 72 hours for refills to be completed.    Today you received the following chemotherapy and/or immunotherapy agents FAM-TRASTUZUMAB DERUXTECAN      To help prevent nausea and vomiting after your treatment, we encourage you to take your nausea medication as directed.  BELOW ARE SYMPTOMS THAT SHOULD BE REPORTED IMMEDIATELY: *FEVER GREATER THAN 100.4 F (38 C) OR HIGHER *CHILLS OR SWEATING *NAUSEA AND VOMITING THAT IS NOT CONTROLLED WITH YOUR NAUSEA MEDICATION *UNUSUAL SHORTNESS OF BREATH *UNUSUAL BRUISING OR BLEEDING *URINARY PROBLEMS (pain or burning when urinating, or frequent urination) *BOWEL PROBLEMS (unusual diarrhea, constipation, pain near the anus) TENDERNESS IN MOUTH  AND THROAT WITH OR WITHOUT PRESENCE OF ULCERS (sore throat, sores in mouth, or a toothache) UNUSUAL RASH, SWELLING OR PAIN  UNUSUAL VAGINAL DISCHARGE OR ITCHING   Items with * indicate a potential emergency and should be followed up as soon as possible or go to the Emergency Department if any problems should occur.  Please show the CHEMOTHERAPY ALERT CARD or IMMUNOTHERAPY ALERT CARD at check-in to the Emergency Department and triage nurse.  Should you have questions after your visit or need to cancel or reschedule your appointment, please contact [facility]  Dept: [phone]  and follow the prompts.  Office hours are 8:00 a.m. to 4:30 p.m. Monday - Friday. Please note that voicemails left after 4:00 p.m. may not be  returned until the following business day.  We are closed weekends and major holidays. You have access to a nurse at all times for urgent questions. Please call the main number to the clinic Dept: [phone] and follow the prompts.  For any non-urgent questions, you may also contact your provider using MyChart. We now offer e-Visits for anyone 82 and older to request care online for non-urgent symptoms. For details visit [email or web address].   Also download the MyChart app! Go to the app store, search "MyChart", open the app, select [place], and log in with your MyChart username and password.  Fam-Trastuzumab Deruxtecan Injection What is this medication? FAM-TRASTUZUMAB DERUXTECAN (fam-tras TOOZ eu mab DER ux TEE kan) treats some types of cancer. It works by blocking a protein that causes cancer cells to grow and multiply. This helps to slow or stop the spread of cancer cells. This medicine may be used for other purposes; ask your health care provider or pharmacist if you have questions. COMMON BRAND NAME(S): ENHERTU What should I tell my care team before I take this medication? They need to know if you have any of these conditions: Heart disease Heart failure Infection, especially a viral infection, such as chickenpox, cold sores, or herpes Liver disease Lung or breathing disease, such as asthma or COPD An unusual or allergic reaction to fam-trastuzumab deruxtecan, other medications, foods, dyes, or preservatives Pregnant or trying to get pregnant Breast-feeding How should I use this medication? This medication is injected into a vein. It is given by your care team in a hospital or clinic setting. A special MedGuide will be given to you before each treatment. Be sure to read this information carefully each time. Talk to your care team about the use of this medication in children. Special care may be needed. Overdosage: If you think you have taken too much of this medicine contact a  poison control center or emergency room at once. NOTE: This medicine is only for you. Do not share this medicine with others. What if I miss a dose? It is important not to miss your dose. Call your care team if you are unable to keep an appointment. What may interact with this medication? Interactions are not expected. This list may not describe all possible interactions. Give your health care provider a list of all the medicines, herbs, non-prescription drugs, or dietary supplements you use. Also tell them if you smoke, drink alcohol, or use illegal drugs. Some items may interact with your medicine. What should I watch for while using this medication? Visit your care team for regular checks on your progress. Tell your care team if your symptoms do not start to get better or if they get worse. Your condition will be  monitored carefully while you are receiving this medication. Do not become pregnant while taking this medication or for 7 months after stopping it. Women should inform their care team if they wish to become pregnant or think they might be pregnant. Men should not father a child while taking this medication and for 4 months after stopping it. There is potential for serious side effects to an unborn child. Talk to your care team for more information. Do not breast-feed an infant while taking this medication or for 7 months after the last dose. This medication has caused decreased sperm counts in some men. This may make it more difficult to father a child. Talk to your care team if you are concerned about your fertility. This medication may increase your risk to bruise or bleed. Call your care team if you notice any unusual bleeding. Be careful brushing or flossing your teeth or using a toothpick because you may get an infection or bleed more easily. If you have any dental work done, tell your dentist you are receiving this medication. This medication may cause dry eyes and blurred vision. If  you wear contact lenses, you may feel some discomfort. Lubricating eye drops may help. See your care team if the problem does not go away or is severe. This medication may increase your risk of getting an infection. Call your care team for advice if you get a fever, chills, sore throat, or other symptoms of a cold or flu. Do not treat yourself. Try to avoid being around people who are sick. Avoid taking medications that contain aspirin, acetaminophen, ibuprofen, naproxen, or ketoprofen unless instructed by your care team. These medications may hide a fever. What side effects may I notice from receiving this medication? Side effects that you should report to your care team as soon as possible: Allergic reactions--skin rash, itching, hives, swelling of the face, lips, tongue, or throat Dry cough, shortness of breath or trouble breathing Infection--fever, chills, cough, sore throat, wounds that don't heal, pain or trouble when passing urine, general feeling of discomfort or being unwell Heart failure--shortness of breath, swelling of the ankles, feet, or hands, sudden weight gain, unusual weakness or fatigue Unusual bruising or bleeding Side effects that usually do not require medical attention (report these to your care team if they continue or are bothersome): Constipation Diarrhea Hair loss Muscle pain Nausea Vomiting This list may not describe all possible side effects. Call your doctor for medical advice about side effects. You may report side effects to FDA at 1-800-FDA-1088. Where should I keep my medication? This medication is given in a hospital or clinic. It will not be stored at home. NOTE: This sheet is a summary. It may not cover all possible information. If you have questions about this medicine, talk to your doctor, pharmacist, or health care provider.  2024 Elsevier/Gold Standard (2021-02-10 00:00:00)

## 2023-01-27 ENCOUNTER — Encounter: Payer: Self-pay | Admitting: Oncology

## 2023-02-02 ENCOUNTER — Inpatient Hospital Stay: Payer: Medicare Other

## 2023-02-02 ENCOUNTER — Inpatient Hospital Stay (INDEPENDENT_AMBULATORY_CARE_PROVIDER_SITE_OTHER): Payer: Medicare Other | Admitting: Hematology and Oncology

## 2023-02-02 ENCOUNTER — Encounter: Payer: Self-pay | Admitting: Hematology and Oncology

## 2023-02-02 VITALS — BP 147/71 | HR 71 | Temp 98.3°F | Resp 18 | Ht 60.25 in | Wt 109.3 lb

## 2023-02-02 DIAGNOSIS — Z171 Estrogen receptor negative status [ER-]: Secondary | ICD-10-CM

## 2023-02-02 DIAGNOSIS — C7951 Secondary malignant neoplasm of bone: Secondary | ICD-10-CM

## 2023-02-02 DIAGNOSIS — C50912 Malignant neoplasm of unspecified site of left female breast: Secondary | ICD-10-CM | POA: Diagnosis not present

## 2023-02-02 DIAGNOSIS — C787 Secondary malignant neoplasm of liver and intrahepatic bile duct: Secondary | ICD-10-CM

## 2023-02-02 DIAGNOSIS — G629 Polyneuropathy, unspecified: Secondary | ICD-10-CM

## 2023-02-02 DIAGNOSIS — C50412 Malignant neoplasm of upper-outer quadrant of left female breast: Secondary | ICD-10-CM | POA: Diagnosis not present

## 2023-02-02 DIAGNOSIS — Z5112 Encounter for antineoplastic immunotherapy: Secondary | ICD-10-CM | POA: Diagnosis not present

## 2023-02-02 DIAGNOSIS — C7989 Secondary malignant neoplasm of other specified sites: Secondary | ICD-10-CM

## 2023-02-02 LAB — CBC WITH DIFFERENTIAL (CANCER CENTER ONLY)
Abs Immature Granulocytes: 0.01 10*3/uL (ref 0.00–0.07)
Basophils Absolute: 0 10*3/uL (ref 0.0–0.1)
Basophils Relative: 1 %
Eosinophils Absolute: 0 10*3/uL (ref 0.0–0.5)
Eosinophils Relative: 1 %
HCT: 35.5 % — ABNORMAL LOW (ref 36.0–46.0)
Hemoglobin: 12 g/dL (ref 12.0–15.0)
Immature Granulocytes: 0 %
Lymphocytes Relative: 31 %
Lymphs Abs: 1.7 10*3/uL (ref 0.7–4.0)
MCH: 35.2 pg — ABNORMAL HIGH (ref 26.0–34.0)
MCHC: 33.8 g/dL (ref 30.0–36.0)
MCV: 104.1 fL — ABNORMAL HIGH (ref 80.0–100.0)
Monocytes Absolute: 0.5 10*3/uL (ref 0.1–1.0)
Monocytes Relative: 9 %
Neutro Abs: 3.2 10*3/uL (ref 1.7–7.7)
Neutrophils Relative %: 58 %
Platelet Count: 132 10*3/uL — ABNORMAL LOW (ref 150–400)
RBC: 3.41 MIL/uL — ABNORMAL LOW (ref 3.87–5.11)
RDW: 13.9 % (ref 11.5–15.5)
WBC Count: 5.4 10*3/uL (ref 4.0–10.5)
nRBC: 0 % (ref 0.0–0.2)

## 2023-02-02 LAB — CMP (CANCER CENTER ONLY)
ALT: 18 U/L (ref 0–44)
AST: 33 U/L (ref 15–41)
Albumin: 3.7 g/dL (ref 3.5–5.0)
Alkaline Phosphatase: 54 U/L (ref 38–126)
Anion gap: 7 (ref 5–15)
BUN: 23 mg/dL (ref 8–23)
CO2: 24 mmol/L (ref 22–32)
Calcium: 9.8 mg/dL (ref 8.9–10.3)
Chloride: 106 mmol/L (ref 98–111)
Creatinine: 0.71 mg/dL (ref 0.44–1.00)
GFR, Estimated: >60 mL/min (ref >60)
Glucose, Bld: 93 mg/dL (ref 70–99)
Potassium: 4.1 mmol/L (ref 3.5–5.1)
Sodium: 137 mmol/L (ref 135–145)
Total Bilirubin: 0.5 mg/dL (ref 0.3–1.2)
Total Protein: 6.5 g/dL (ref 6.5–8.1)

## 2023-02-02 NOTE — Progress Notes (Signed)
[facility]  [address] [place],  [place]  [address] [phone]  Clinic Day:  02/02/2023  Referring physician: [name], MD  ASSESSMENT & PLAN:   Assessment & Plan: No problem-specific Assessment & Plan notes found for this encounter.    The patient understands the plans discussed today and is in agreement with them.  She knows to contact our office if she develops concerns prior to her next appointment.   I provided *** minutes of face-to-face time during this encounter and > 50% was spent counseling as documented under my assessment and plan.    Adah Perl, PA-C  [facility] [address] [place] [address] Dept: [phone] Dept Fax: [phone]   [name].     CHIEF COMPLAINT:  CC: ***  Current Treatment:  ***  HISTORY OF PRESENT ILLNESS:   Oncology History Overview Note  Cancer Staging Malignant neoplasm of upper-outer quadrant of left breast in female, estrogen receptor negative ([name]) Staging form: Breast, AJCC 8th Edition - Clinical stage from 10/11/2018: Stage IIB (cT2, cN1, cM0, G3, ER-, PR-, HER2+) - Signed by Malachy Mood, MD on 11/02/2018 - Clinical: No stage assigned - Unsigned    History of colon cancer, stage II  09/2005 Initial Diagnosis   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 Findings were a 5.9 x 3.8 x 1.2 cm moderate to well-differentiated adenocarcinoma penetrating the muscular wall. Forty lymph nodes negative. No vascular or lymphatic invasion. Multiple diverticula with microabscess formation and inflammation. Carcinoma present in at least 1 diverticulum. Preop CEA 5.2 with lab normal range 0 to 2.5. Preop CT scan with no obvious additional pathology.    2007 -  Chemotherapy   She  received oral Xeloda chemotherapy as an adjuvant. She declined treatment on a clinical trial.    11/12/2011 Initial Diagnosis   [name], stage II   12/09/2011 Procedure   Followup colonoscopy done on 12/09/2011. She was found to have 2 polyps which were removed. Chronic diverticulosis.     Malignant neoplasm of upper-outer quadrant of left breast in female, estrogen receptor negative ([name])  09/26/2018 Mammogram   Mammogram/US of left breast 09/26/18 IMPRESSION:  1. 2.9cm irregular mass in the upper outer left breast corresponds to the palpable abnormality. This is highly suspicious for breast carcinoma.  2. Two adjacent abnormal left axillary  Lymph nodes suspicious for metastatic adenopathy. There is a third borderline abnormal left axillary LN with a cortex thickened to 4mm.  3. Benign right breast cyst. No evidence of right breast malignancy.    10/11/2018 Cancer Staging   Staging form: Breast, AJCC 8th Edition - Clinical stage from 10/11/2018: Stage IIB (cT2, cN1, cM0, G3, ER-, PR-, HER2+) - Signed by Malachy Mood, MD on 11/02/2018   10/11/2018 Initial Biopsy   Diagnosis 10/11/18 1. Breast, left, needle core biopsy, upper outer left 2 o'clock - INVASIVE DUCTAL CARCINOMA. - DUCTAL CARCINOMA IN SITU. - LYMPHOVASCULAR INVASION IS IDENTIFIED. - SEE COMMENT. 2. Lymph node, needle/core biopsy, left axilla - INVASIVE DUCTAL CARCINOMA. - SEE COMMENT.   10/11/2018 Receptors her2   The tumor cells are POSITIVE for Her2 (3+). Estrogen Receptor: 0%, NEGATIVE Progesterone Receptor: 0%, NEGATIVE Proliferation Marker Ki67: 70%   11/02/2018 Initial Diagnosis   Malignant neoplasm of upper-outer  quadrant of left breast in female, estrogen receptor negative ([name])   11/15/2018 Breast MRI   MRI breast 11/15/18  IMPRESSION: 1. 3.3 centimeter mass in the LATERAL portion of the LEFT breast consistent with known malignancy. 2. There is significant non mass enhancement surrounding this mass and extending  anteriorly into the nipple base, with largest diameter in the anterior to posterior axis, measuring 7.2 centimeters. 3. If the patient would consider breast conservation, additional MR guided core biopsies are recommended. Consider biopsy of the inferior and anterior extent of the non mass enhancement to document extent of disease. 4. Three enlarged LEFT axillary lymph nodes. 5. RIGHT breast is negative.   11/15/2018 PET scan   PET 11/15/18 IMPRESSION: Hypermetabolic left breast lesion compatible with known primary. Hypermetabolic left axillary lymph nodes are consistent with metastatic disease.   No evidence for additional hypermetabolic metastatic involvement in the neck, chest, abdomen, or pelvis.   11/17/2018 - 03/01/2019 Chemotherapy   Neo-adjuvant Kadcyla and perjeta q3weeks for 6 cycles starting 11/17/18. Stopped before surgery    11/29/2018 Pathology Results   Diagnosis 1. Breast, left, needle core biopsy, inferior anterior (cylinder clip) - INVASIVE DUCTAL CARCINOMA. - DUCTAL CARCINOMA IN SITU. - LYMPHOVASCULAR INVASION IS IDENTIFIED. - SEE COMMENT. 2. Breast, left, needle core biopsy, central posterior (barbell clip) - INVASIVE DUCTAL CARCINOMA. - LYMPHOVASCULAR INVASION IS IDENTIFIED.   02/12/2019 Breast MRI   IMPRESSION: 1. Complete resolution of previously identified enhancing mass and associated non mass enhancement within the left breast. This is consistent with excellent response to chemotherapy. No residual or suspicious findings are identified. 2. No MRI evidence of malignancy on the right. 3. Previously identified left axillary lymphadenopathy not definitively seen on today's study. However, this may be due to decreased field-of-view compared to prior study.     03/14/2019 Cancer Staging   Staging form: Breast, AJCC 8th Edition - Pathologic stage from 03/14/2019: pT0, pN0, cM0, GX, ER: Unknown, PR: Unknown, HER2: Not Assessed - Signed by Malachy Mood, MD on  03/28/2019   03/14/2019 Surgery   LEFT MASTECTOMY WITH TARGETED LYMPH NODE DISSECTION and Left Axillary Sentinel Lymph  Node Biopsy by Dr Carolynne Edouard 03/14/19    03/14/2019 Pathology Results   FINAL MICROSCOPIC DIAGNOSIS:   A. LYMPH NODE, LEFT, SENTINEL, BIOPSY:  - There is no evidence of carcinoma in 1 of 1 lymph node (0/1).   B. BREAST, LEFT, MASTECTOMY:  - Benign breast parenchyma with treatment-related changes.  - There is no evidence of malignancy.  - See oncology table below.   C. LYMPH NODE, LEFT #1, SENTINEL, BIOPSY:  - There is no evidence of carcinoma in 1 of 1 lymph node (0/1).   D. LYMPH NODE, LEFT #2, SENTINEL, BIOPSY:  - There is no evidence of carcinoma in 1 of 1 lymph node (0/1).     04/04/2019 - 04/25/2019 Chemotherapy   Maintenance Herceptin injections every 3 weeks starting 04/03/19 to complete 1 year of treatment that was started in 11/2018. Stopped after 2nd dose as she will not be repeating Echos.    06/03/2021 - 06/24/2021 Chemotherapy   Patient is on Treatment Plan : BREAST Trastuzumab q21d X 11 Cycles     06/25/2021 - 01/01/2022 Chemotherapy   Patient is on Treatment Plan : BREAST Docetaxel + Trastuzumab + Pertuzumab (THP) q21d     06/25/2021 - 01/02/2022 Chemotherapy   Patient is on Treatment Plan : BREAST Docetaxel + Trastuzumab + Pertuzumab (THP) q21d     09/14/2021 Imaging   CT chest/abdomen/pelvis  IMPRESSION:  1. Interval resolution of previously seen bulky left axillary and  subpectoral lymphadenopathy. No persistently enlarged lymph nodes.  2. Multiple liver lesions are significantly diminished in size.  3. Widespread osseous metastatic disease, with an interval increase  in sclerosis of several previously lytic lesions.  4. Constellation of findings is consistent with treatment response  of metastatic disease  5. New, although age indeterminate pathologic wedge deformity of the  T6 vertebral body as well as an increased pathologic wedge deformity  of  the L2 vertebral body.  6. Coronary artery disease.  Aortic Atherosclerosis (ICD10-I70.0).    01/26/2022 -  Chemotherapy   Patient is on Treatment Plan : BREAST METASTATIC Fam-Trastuzumab Deruxtecan-nxki (Enhertu) (5.4) q21d     Malignant neoplasm metastatic to liver ([name])  04/17/2021 Imaging   CT ABDOMEN AND PELVIS WITH CONTRAST: -New lytic lesion involving the L2 vertebral body (6:72, 2:26) with minimal cortical breakthrough superiorly, but with preservation of vertebral body height, concerning for metastatic disease.  -There are several indeterminate hepatic lesions as described above. In addition to the lesions described above, there is an additional subtle 1.5 cm hypodensity in the hepatic dome (2:7). Given clinical history and presence of a new L2 lesion, leading differential consideration is metastatic disease. Recommend further evaluation with MRI of the abdomen with and without contrast.   05/12/2021 Imaging   MRI ABDOMEN WITH AND WITHOUT CONTRAST: Numerous heterogeneously enhancing, internally necrotic lesions in the right hepatic lobe, highly concerning for metastatic disease. Largest lesion measures up to 6.7 cm in hepatic segment VI.   Enhancing osseous lesions in the L2 vertebral body, both iliac bones, and in the sacrum, highly concerning for osseous metastases.   05/15/2021 Initial Diagnosis   Liver metastases ([name])   05/27/2021 PET scan   1. Widespread recurrent/metastatic disease in this patient who is  status post bilateral mastectomy. Left chest wall recurrence with  left axillary/subpectoral, supraclavicular nodal metastasis.  2. Hepatic, left adrenal, and widespread osseous metastasis.  3. Incidental findings, including: Right nephrolithiasis. Tiny  hiatal hernia.    05/27/2021 Imaging   MRI LUMBAR AND THORACIC SPINE: Thoracic spine:  Osseous metastatic disease involving each level. The most dramatic deposits are at T4 and T6 where there is extraosseous/epidural tumor.  No cord compression but there is foraminal impingement on the left at T6-7 and on the right at T3-4. Early dorsal epidural tumor at the level of T10 and T3.   Lumbar spine:  1. Widespread osseous metastatic disease with largest deposit  replacing the L2 body where there is a compression fracture with  mild height loss. Also at this level is early epidural tumor  extension likely affecting both L2-3 foramina.  2. 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    05/27/2021 Imaging   MRI HEAD WITH AND WITHOUT CONTRAST: Negative for metastatic disease to the brain or calvarium.   06/03/2021 - 06/24/2021 Chemotherapy   Patient is on Treatment Plan : BREAST Trastuzumab q21d X 11 Cycles     06/25/2021 - 01/01/2022 Chemotherapy   Patient is on Treatment Plan : BREAST Docetaxel + Trastuzumab + Pertuzumab (THP) q21d     06/25/2021 - 01/02/2022 Chemotherapy   Patient is on Treatment Plan : BREAST Docetaxel + Trastuzumab + Pertuzumab (THP) q21d     09/14/2021 Imaging   CT chest/abdomen/pelvis  IMPRESSION:  1. Interval resolution of previously seen bulky left axillary and  subpectoral lymphadenopathy. No persistently enlarged lymph nodes.  2. Multiple liver lesions are significantly  diminished in size.  3. Widespread osseous metastatic disease, with an interval increase  in sclerosis of several previously lytic lesions.  4. Constellation of findings is consistent with treatment response  of metastatic disease  5. New, although age indeterminate pathologic wedge deformity of the  T6 vertebral body as well as an increased pathologic wedge deformity  of the L2 vertebral body.  6. Coronary artery disease.  Aortic Atherosclerosis (ICD10-I70.0).    01/26/2022 -  Chemotherapy   Patient is on Treatment Plan : BREAST METASTATIC Fam-Trastuzumab Deruxtecan-nxki (Enhertu) (5.4) q21d     Malignant neoplasm metastatic to bone ([name])  04/17/2021 Imaging   CT ABDOMEN AND PELVIS WITH  CONTRAST: -New lytic lesion involving the L2 vertebral body (6:72, 2:26) with minimal cortical breakthrough superiorly, but with preservation of vertebral body height, concerning for metastatic disease.  -There are several indeterminate hepatic lesions as described above. In addition to the lesions described above, there is an additional subtle 1.5 cm hypodensity in the hepatic dome (2:7). Given clinical history and presence of a new L2 lesion, leading differential consideration is metastatic disease. Recommend further evaluation with MRI of the abdomen with and without contrast.   05/12/2021 Imaging   MRI ABDOMEN WITH AND WITHOUT CONTRAST: Numerous heterogeneously enhancing, internally necrotic lesions in the right hepatic lobe, highly concerning for metastatic disease. Largest lesion measures up to 6.7 cm in hepatic segment VI.   Enhancing osseous lesions in the L2 vertebral body, both iliac bones, and in the sacrum, highly concerning for osseous metastases.   05/15/2021 Initial Diagnosis   Bone metastases ([name])   05/27/2021 PET scan   1. Widespread recurrent/metastatic disease in this patient who is  status post bilateral mastectomy. Left chest wall recurrence with  left axillary/subpectoral, supraclavicular nodal metastasis.  2. Hepatic, left adrenal, and widespread osseous metastasis.  3. Incidental findings, including: Right nephrolithiasis. Tiny  hiatal hernia.    05/27/2021 Imaging   MRI LUMBAR AND THORACIC SPINE: Thoracic spine:  Osseous metastatic disease involving each level. The most dramatic deposits are at T4 and T6 where there is extraosseous/epidural tumor. No cord compression but there is foraminal impingement on the left at T6-7 and on the right at T3-4. Early dorsal epidural tumor at the level of T10 and T3.   Lumbar spine:  1. Widespread osseous metastatic disease with largest deposit  replacing the L2 body where there is a compression fracture with  mild height loss. Also  at this level is early epidural tumor  extension likely affecting both L2-3 foramina.  2. 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    05/27/2021 Imaging   MRI HEAD WITH AND WITHOUT CONTRAST: Negative for metastatic disease to the brain or calvarium.   06/03/2021 - 06/24/2021 Chemotherapy   Patient is on Treatment Plan : BREAST Trastuzumab q21d X 11 Cycles     06/25/2021 - 01/01/2022 Chemotherapy   Patient is on Treatment Plan : BREAST Docetaxel + Trastuzumab + Pertuzumab (THP) q21d     06/25/2021 - 01/02/2022 Chemotherapy   Patient is on Treatment Plan : BREAST Docetaxel + Trastuzumab + Pertuzumab (THP) q21d     09/14/2021 Imaging   CT chest/abdomen/pelvis  IMPRESSION:  1. Interval resolution of previously seen bulky left axillary and  subpectoral lymphadenopathy. No persistently enlarged lymph nodes.  2. Multiple liver lesions are significantly diminished in size.  3. Widespread osseous metastatic disease, with an interval increase  in sclerosis of several previously lytic lesions.  4. Constellation of findings is  consistent with treatment response  of metastatic disease  5. New, although age indeterminate pathologic wedge deformity of the  T6 vertebral body as well as an increased pathologic wedge deformity  of the L2 vertebral body.  6. Coronary artery disease.  Aortic Atherosclerosis (ICD10-I70.0).    01/26/2022 -  Chemotherapy   Patient is on Treatment Plan : BREAST METASTATIC Fam-Trastuzumab Deruxtecan-nxki (Enhertu) (5.4) q21d         INTERVAL HISTORY:  Jasmin Lewis is here today for repeat clinical assessment. She denies fevers or chills. She denies pain. Her appetite is good. Her weight {Weight change:10426}.  REVIEW OF SYSTEMS:  Review of Systems - Oncology   VITALS:  Blood pressure (!) 147/71, pulse 71, temperature 98.3 F (36.8 C), temperature source Oral, resp. rate 18, height 5' 0.25" (1.53 m), weight 109 lb 4.8 oz (49.6 kg), SpO2 97%.  Wt  Readings from Last 3 Encounters:  02/02/23 109 lb 4.8 oz (49.6 kg)  01/18/23 108 lb 0.6 oz (49 kg)  01/13/23 108 lb 6.4 oz (49.2 kg)    Body mass index is 21.17 kg/m.  Performance status (ECOG): {CHL ONC [id number]  PHYSICAL EXAM:  Physical Exam  LABS:      Latest Ref Rng & Units 02/02/2023    1:48 PM 01/13/2023   12:00 AM 12/23/2022   12:00 AM  CBC  WBC 4.0 - 10.5 K/uL 5.4  4.9     4.9      Hemoglobin 12.0 - 15.0 g/dL 40.3  47.4     25.9      Hematocrit 36.0 - 46.0 % 35.5  37     38      Platelets 150 - 400 K/uL 132  136     165         This result is from an external source.      Latest Ref Rng & Units 02/02/2023    1:48 PM 01/13/2023   12:00 AM 12/23/2022   12:00 AM  CMP  Glucose 70 - 99 mg/dL 93     BUN 8 - 23 mg/dL 23  19     20       Creatinine 0.44 - 1.00 mg/dL 5.63  0.6     0.7      Sodium 135 - 145 mmol/L 137  138     139      Potassium 3.5 - 5.1 mmol/L 4.1  3.9     4.3      Chloride 98 - 111 mmol/L 106  106     108      CO2 22 - 32 mmol/L 24  27     26       Calcium 8.9 - 10.3 mg/dL 9.8  9.7     87.5      Total Protein 6.5 - 8.1 g/dL 6.5     Total Bilirubin 0.3 - 1.2 mg/dL 0.5     Alkaline Phos 38 - 126 U/L 54  59     61      AST 15 - 41 U/L 33  41     48      ALT 0 - 44 U/L 18  19     19          This result is from an external source.     Lab Results  Component Value Date   CEA1 1.8 06/03/2021   CEA 0.5 11/13/2012   /  CEA  Date Value Ref Range Status  06/03/2021  1.8 0.0 - 4.7 ng/mL Final    Comment:    (NOTE)                             Nonsmokers          <3.9                             Smokers             <5.6 [employer] Electrochemiluminescence Immunoassay (ECLIA) Values obtained with different assay methods or kits cannot be used interchangeably.  Results cannot be interpreted as absolute evidence of the presence or absence of malignant disease. Performed At: [facility] [address] [place], [place]  [id number] Jolene Schimke MD [id number]:[id number]   11/13/2012 0.5 0.0 - 5.0 ng/mL Final   No results found for: "PSA1" No results found for: "CAN199" No results found for: "CAN125"  No results found for: "TOTALPROTELP", "ALBUMINELP", "A1GS", "A2GS", "BETS", "BETA2SER", "GAMS", "MSPIKE", "SPEI" No results found for: "TIBC", "FERRITIN", "IRONPCTSAT" Lab Results  Component Value Date   LDH 186 11/12/2011   LDH 187 09/08/2010   LDH 171 09/09/2009    STUDIES:  No results found.    HISTORY:   Past Medical History:  Diagnosis Date   Arthritis    shoulder   Breast cancer ([name])    Cancer ([name])    left breast ca   Colon cancer ([name])    [name], stage II 11/12/2011   Sigmoid lesion 5.9 cm  40 nodes negative but focus of cancer in a diverticum   Pre-op CEA 5.2 with lab normal up to 2.5 resected 09/21/05   Xeloda adjuvant chemotherapy   Hypertension     Past Surgical History:  Procedure Laterality Date   COLONOSCOPY     HEMICOLECTOMY  2007   MASTECTOMY WITH AXILLARY LYMPH NODE DISSECTION Left 03/14/2019   Procedure: LEFT MASTECTOMY WITH TARGETED LYMPH NODE DISSECTION;  Surgeon: Griselda Miner, MD;  Location: [facility] OR;  Service: General;  Laterality: Left;   SENTINEL NODE BIOPSY Left 03/14/2019   Procedure: Left Axillary Sentinel Lymph  Node Biopsy;  Surgeon: Griselda Miner, MD;  Location: [facility] OR;  Service: General;  Laterality: Left;   TONSILLECTOMY     WISDOM TOOTH EXTRACTION      Family History  Problem Relation Age of Onset   Cancer Maternal Aunt        colon cancer     Social History:  reports that she has never smoked. She has never used smokeless tobacco. She reports that she does not drink alcohol and does not use drugs.The patient is {Blank single:19197::"alone","accompanied by"} *** today.  Allergies: No Known Allergies  Current Medications: Current Outpatient Medications  Medication Sig Dispense Refill   Biotin 1 MG CAPS Take by mouth.     fish oil-omega-3 fatty  acids 1000 MG capsule Take 1 g by mouth daily.     gabapentin (NEURONTIN) 100 MG capsule Take 1 capsule (100 mg total) by mouth at bedtime. (Patient not taking: Reported on 03/05/2022) 30 capsule 5   magnesium citrate SOLN Take 0.5 Bottles by mouth as needed for severe constipation.     Multiple Vitamin (MULTIVITAMIN) capsule Take 1 capsule by mouth daily.     ondansetron (ZOFRAN) 4 MG tablet Take 1 tablet (4 mg total) by mouth every 8 (eight) hours as needed for nausea or vomiting. (  Patient not taking: Reported on 03/05/2022) 20 tablet 2   polyethylene glycol (MIRALAX / GLYCOLAX) 17 g packet Take 17 g by mouth as needed. constipation     No current facility-administered medications for this visit.

## 2023-02-03 ENCOUNTER — Encounter: Payer: Self-pay | Admitting: Hematology and Oncology

## 2023-02-03 ENCOUNTER — Encounter: Payer: Self-pay | Admitting: Oncology

## 2023-02-03 DIAGNOSIS — G629 Polyneuropathy, unspecified: Secondary | ICD-10-CM | POA: Insufficient documentation

## 2023-02-03 HISTORY — DX: Polyneuropathy, unspecified: G62.9

## 2023-02-03 NOTE — Assessment & Plan Note (Signed)
Stage IIB (T2c N1 M0) HER2 receptor positive, ER/PR negative, left breast cancer diagnosed in June 2020.  She was treated with neoadjuvant Kadcyla/Perjeta followed by left mastectomy.  She had a complete response in breast and nodes. She had only received two doses of adjuvant trastuzumab before discontinuing in December 2020.

## 2023-02-03 NOTE — Assessment & Plan Note (Signed)
Numerous heterogeneously enhancing, internally necrotic lesions in the right hepatic lobe CT in December 2023, which was consistent with metastatic disease. Largest lesion measures up to 6.7 cm and there are at least 6 lesions.  Liver biopsy was consistent with metastatic breast cancer with negative estrogen and progesterone receptors and positive HER2 receptor.  Routine CT scan showed excellent response with significant decrease in the size of her liver lesions and sustained response. She later had progression of this disease in the chest wall and was switched to Enhertu.  Most recent CT of chest, abdomen, and pelvis in March revealed stable subtle liver lesions with at least 1 lesion is no longer visualized.

## 2023-02-03 NOTE — Assessment & Plan Note (Signed)
She has had chronic left hand pain, initially thought to be related to lymphedema.  Her lymphedema was controlled.  She was referred to neurology for further evaluation.  EMG revealed polyneuropathy.  She has now seen orthopedics and had an MRI of the left brachial plexus that was unremarkable, but limited due to patient motion.  She is to return to see the orthopedic surgeon in October.  She had been on gabapentin 100 mg at bedtime, but discontinued that on her own.

## 2023-02-03 NOTE — Assessment & Plan Note (Signed)
Bone metastases diagnosed in December 2022, for which she was placed on zoledronic acid every 4 weeks. She is now receiving zoledronic acid every 6 weeks to correspond with her chemotherapy.

## 2023-02-04 ENCOUNTER — Encounter: Payer: Self-pay | Admitting: Oncology

## 2023-02-07 MED FILL — Dexamethasone Sodium Phosphate Inj 100 MG/10ML: INTRAMUSCULAR | Qty: 1 | Status: AC

## 2023-02-07 MED FILL — Fam-Trastuzumab Deruxtecan-nxki For IV Soln 100 MG: INTRAVENOUS | Qty: 10 | Status: AC

## 2023-02-08 ENCOUNTER — Inpatient Hospital Stay: Payer: Medicare Other | Attending: Oncology

## 2023-02-08 VITALS — BP 142/74 | HR 77 | Temp 98.0°F | Resp 18 | Ht 60.25 in | Wt 110.0 lb

## 2023-02-08 DIAGNOSIS — C50412 Malignant neoplasm of upper-outer quadrant of left female breast: Secondary | ICD-10-CM | POA: Insufficient documentation

## 2023-02-08 DIAGNOSIS — G629 Polyneuropathy, unspecified: Secondary | ICD-10-CM | POA: Diagnosis not present

## 2023-02-08 DIAGNOSIS — R944 Abnormal results of kidney function studies: Secondary | ICD-10-CM | POA: Insufficient documentation

## 2023-02-08 DIAGNOSIS — I2510 Atherosclerotic heart disease of native coronary artery without angina pectoris: Secondary | ICD-10-CM | POA: Insufficient documentation

## 2023-02-08 DIAGNOSIS — Z85038 Personal history of other malignant neoplasm of large intestine: Secondary | ICD-10-CM | POA: Diagnosis not present

## 2023-02-08 DIAGNOSIS — Z79899 Other long term (current) drug therapy: Secondary | ICD-10-CM | POA: Diagnosis not present

## 2023-02-08 DIAGNOSIS — Z9012 Acquired absence of left breast and nipple: Secondary | ICD-10-CM | POA: Diagnosis not present

## 2023-02-08 DIAGNOSIS — C7951 Secondary malignant neoplasm of bone: Secondary | ICD-10-CM | POA: Diagnosis not present

## 2023-02-08 DIAGNOSIS — R7989 Other specified abnormal findings of blood chemistry: Secondary | ICD-10-CM | POA: Diagnosis not present

## 2023-02-08 DIAGNOSIS — C787 Secondary malignant neoplasm of liver and intrahepatic bile duct: Secondary | ICD-10-CM | POA: Insufficient documentation

## 2023-02-08 DIAGNOSIS — Z5112 Encounter for antineoplastic immunotherapy: Secondary | ICD-10-CM | POA: Insufficient documentation

## 2023-02-08 DIAGNOSIS — Z171 Estrogen receptor negative status [ER-]: Secondary | ICD-10-CM

## 2023-02-08 DIAGNOSIS — I972 Postmastectomy lymphedema syndrome: Secondary | ICD-10-CM | POA: Diagnosis not present

## 2023-02-08 MED ORDER — SODIUM CHLORIDE 0.9% FLUSH: 10.0000 mL | INTRAVENOUS | Status: DC | PRN

## 2023-02-08 MED ORDER — ACETAMINOPHEN 325 MG PO TABS
650.0000 mg | ORAL_TABLET | Freq: Once | ORAL | Status: AC
  Administered 2023-02-08: 650 mg via ORAL
  Filled 2023-02-08: qty 2

## 2023-02-08 MED ORDER — DEXTROSE 5 % IV SOLN: Freq: Once | INTRAVENOUS | Status: AC

## 2023-02-08 MED ORDER — DIPHENHYDRAMINE HCL 25 MG PO CAPS
25.0000 mg | ORAL_CAPSULE | Freq: Once | ORAL | Status: AC
  Administered 2023-02-08: 25 mg via ORAL
  Filled 2023-02-08: qty 1

## 2023-02-08 MED ORDER — HEPARIN SOD (PORK) LOCK FLUSH 100 UNIT/ML IV SOLN: 500.0000 [IU] | Freq: Once | INTRAVENOUS | Status: DC | PRN

## 2023-02-08 MED ORDER — FAM-TRASTUZUMAB DERUXTECAN-NXKI CHEMO 100 MG IV SOLR
4.0500 mg/kg | Freq: Once | INTRAVENOUS | Status: AC
  Administered 2023-02-08: 200 mg via INTRAVENOUS
  Filled 2023-02-08: qty 10

## 2023-02-08 MED ORDER — PALONOSETRON HCL INJECTION 0.25 MG/5ML
0.2500 mg | Freq: Once | INTRAVENOUS | Status: AC
  Administered 2023-02-08: 0.25 mg via INTRAVENOUS
  Filled 2023-02-08: qty 5

## 2023-02-08 MED ORDER — SODIUM CHLORIDE 0.9 % IV SOLN
10.0000 mg | Freq: Once | INTRAVENOUS | Status: AC
  Administered 2023-02-08: 10 mg via INTRAVENOUS
  Filled 2023-02-08: qty 10

## 2023-02-08 NOTE — Patient Instructions (Signed)
Fam-Trastuzumab Deruxtecan Injection What is this medication? FAM-TRASTUZUMAB DERUXTECAN (fam-tras TOOZ eu mab DER ux TEE kan) treats some types of cancer. It works by blocking a protein that causes cancer cells to grow and multiply. This helps to slow or stop the spread of cancer cells. This medicine may be used for other purposes; ask your health care provider or pharmacist if you have questions. COMMON BRAND NAME(S): ENHERTU What should I tell my care team before I take this medication? They need to know if you have any of these conditions: Heart disease Heart failure Infection, especially a viral infection, such as chickenpox, cold sores, or herpes Liver disease Lung or breathing disease, such as asthma or COPD An unusual or allergic reaction to fam-trastuzumab deruxtecan, other medications, foods, dyes, or preservatives Pregnant or trying to get pregnant Breast-feeding How should I use this medication? This medication is injected into a vein. It is given by your care team in a hospital or clinic setting. A special MedGuide will be given to you before each treatment. Be sure to read this information carefully each time. Talk to your care team about the use of this medication in children. Special care may be needed. Overdosage: If you think you have taken too much of this medicine contact a poison control center or emergency room at once. NOTE: This medicine is only for you. Do not share this medicine with others. What if I miss a dose? It is important not to miss your dose. Call your care team if you are unable to keep an appointment. What may interact with this medication? Interactions are not expected. This list may not describe all possible interactions. Give your health care provider a list of all the medicines, herbs, non-prescription drugs, or dietary supplements you use. Also tell them if you smoke, drink alcohol, or use illegal drugs. Some items may interact with your  medicine. What should I watch for while using this medication? Visit your care team for regular checks on your progress. Tell your care team if your symptoms do not start to get better or if they get worse. Your condition will be monitored carefully while you are receiving this medication. Do not become pregnant while taking this medication or for 7 months after stopping it. Women should inform their care team if they wish to become pregnant or think they might be pregnant. Men should not father a child while taking this medication and for 4 months after stopping it. There is potential for serious side effects to an unborn child. Talk to your care team for more information. Do not breast-feed an infant while taking this medication or for 7 months after the last dose. This medication has caused decreased sperm counts in some men. This may make it more difficult to father a child. Talk to your care team if you are concerned about your fertility. This medication may increase your risk to bruise or bleed. Call your care team if you notice any unusual bleeding. Be careful brushing or flossing your teeth or using a toothpick because you may get an infection or bleed more easily. If you have any dental work done, tell your dentist you are receiving this medication. This medication may cause dry eyes and blurred vision. If you wear contact lenses, you may feel some discomfort. Lubricating eye drops may help. See your care team if the problem does not go away or is severe. This medication may increase your risk of getting an infection. Call your care team for   advice if you get a fever, chills, sore throat, or other symptoms of a cold or flu. Do not treat yourself. Try to avoid being around people who are sick. Avoid taking medications that contain aspirin, acetaminophen, ibuprofen, naproxen, or ketoprofen unless instructed by your care team. These medications may hide a fever. What side effects may I notice from  receiving this medication? Side effects that you should report to your care team as soon as possible: Allergic reactions--skin rash, itching, hives, swelling of the face, lips, tongue, or throat Dry cough, shortness of breath or trouble breathing Infection--fever, chills, cough, sore throat, wounds that don't heal, pain or trouble when passing urine, general feeling of discomfort or being unwell Heart failure--shortness of breath, swelling of the ankles, feet, or hands, sudden weight gain, unusual weakness or fatigue Unusual bruising or bleeding Side effects that usually do not require medical attention (report these to your care team if they continue or are bothersome): Constipation Diarrhea Hair loss Muscle pain Nausea Vomiting This list may not describe all possible side effects. Call your doctor for medical advice about side effects. You may report side effects to FDA at 1-800-FDA-1088. Where should I keep my medication? This medication is given in a hospital or clinic. It will not be stored at home. NOTE: This sheet is a summary. It may not cover all possible information. If you have questions about this medicine, talk to your doctor, pharmacist, or health care provider.  2024 [employer] (2021-02-10 00:00:00)  

## 2023-02-17 LAB — CBC AND DIFFERENTIAL
HCT: 38 (ref 36–46)
Hemoglobin: 13.3 (ref 12.0–16.0)
Neutrophils Absolute: 2.86
Platelets: 179 10*3/uL (ref 150–400)
WBC: 5.1

## 2023-02-17 LAB — BASIC METABOLIC PANEL
BUN: 15 (ref 4–21)
CO2: 25 — AB (ref 13–22)
Chloride: 102 (ref 99–108)
Creatinine: 0.7 (ref 0.5–1.1)
Glucose: 91
Potassium: 4.5 meq/L (ref 3.5–5.1)
Sodium: 132 — AB (ref 137–147)

## 2023-02-17 LAB — HEPATIC FUNCTION PANEL
ALT: 20 U/L (ref 7–35)
AST: 47 — AB (ref 13–35)
Alkaline Phosphatase: 73 (ref 25–125)
Bilirubin, Total: 0.9

## 2023-02-17 LAB — CBC: RBC: 3.69 — AB (ref 3.87–5.11)

## 2023-02-17 LAB — COMPREHENSIVE METABOLIC PANEL
Albumin: 4 (ref 3.5–5.0)
Calcium: 9.3 (ref 8.7–10.7)

## 2023-02-21 ENCOUNTER — Encounter: Payer: Self-pay | Admitting: Oncology

## 2023-02-21 NOTE — Telephone Encounter (Signed)
error 

## 2023-02-23 NOTE — Progress Notes (Signed)
Patient Care Team: [name], MD as PCP - General (Family Medicine) [name], MD as Consulting Physician (Oncology)  Clinic Day: 02/24/23  Referring physician: [name], MD  ASSESSMENT & PLAN:  Assessment & Plan: Malignant neoplasm of upper-outer quadrant of left breast in female, estrogen receptor negative ([name]) Stage IIB (T2c N1 M0) HER2 receptor positive left breast cancer, diagnosed in June 2020. This was ER/PR negative. This was treated with neoadjuvant Kadcyla/Perjeta and left mastectomy, with a complete response in breast and nodes. She had only received two doses of adjuvant Herceptin before being discontinued in December 2020.   History of colon cancer, stage II History of stage II colon cancer, diagnosed in May 2007. This was treated with surgical resection and adjuvant chemotherapy  with Xeloda. Colonoscopy from 2013 revealed 2 polyps which were resected and chronic diverticulosis.   Malignant neoplasm metastatic to liver [facility]) Liver metastases discovered 04/17/21.  Numerous heterogeneously enhancing, internally necrotic lesions in the right hepatic lobe, consistent with metastatic disease. Largest lesion measures up to 6.7 cm and there are at least 6 lesions. We now have biopsy proven HER2 positive recurrent breast cancer with ER/PR negative. She received THP and tolerated it without significant difficulty.  CT scan showed excellent response with significant decrease in the size of her liver lesions. She later had progression of this disease and was switched to Enhertu. Her CT of chest, abdomen, and pelvis on 07/27/2022 looked good with stable liver lesions which are subtle and at least 1 lesion is no longer visualized. The CT done on 02/17/2023 shows continued shrinkage of the liver lesions.    Malignant neoplasm metastatic to bone [facility]) Bone metastases discovered 04/17/21. Enhancing osseous lesions are present in the L2 vertebral body, both iliac bones, and in  the sacrum, highly concerning for osseous metastases. She received her 1st dose of zoledronic acid on January 16th, 2023.  I once again today explained to her and her son the reasoning for this medicine.  Her widespread bone metastases show an interval increase in sclerosis of previously lytic lesions consistent with treatment response. She still has extensive sclerotic metastasis of the bone but these are stable with stable compression of T6 and L2 on the 02/17/23 CT scan.    Chest wall recurrence of breast cancer, left ([name]) Left supraclavicular lymph node measuring 2-3 cm. Also involvement of subpectoral node and chest wall skin. This was rapidly progressive despite receiving  trastuzumab.  We have added in Perjeta and Taxotere to her current regimen and this has been very effective.  The CT scan also shows good response of her adenopathy with resolution of the previous bulky left axillary and subpectoral adenopathy. In September of 2023, she had a 2 cm nodule  located close to the left shoulder associated with nodularity of the left upper chest wall and appeared to have obvious progression of disease again. She was changed to [facility] and is responding well. The nodules have completely resolved.    Lymphedema She does have lymphedema of the left arm; however, this is better today, but with significant edema of the hand as well. We referred her to a lymphedema specialist in [place]. She sleeps with the hand and arm elevated.She has decreased use of her left hand but I think this is more than just the lymphedema.She has seen the neurologist for the nerve conduction velocities on her hand in March.   Polyneuropathy She has had chronic left hand pain, initially thought to be related to  lymphedema.  Her lymphedema was controlled.  She was referred to neurology for further evaluation.  EMG revealed polyneuropathy.  She has now seen orthopedics and had an MRI of the left brachial plexus that was  unremarkable, but limited due to patient motion.  She is to return to see the orthopedic surgeon in October.  She had been on gabapentin 100 mg at bedtime, but discontinued that on her own.   Plan: She informed me that she has a appointment with a neurologist on 03/04/2023. She had a CT on 02/17/2023 which showed a good response of her hepatic metastases, with one lesion decreasing from 10mm to 7mm in the right lower lobe and another from 8mm to 4mm in the hepatic dome. She has a irregular hepatic capsule likely due to "pseudocirrhosis" from treated metastatic lesions. There is no evidence of new or progressive disease. The widespread sclerotic osseous metastases are similar to previous. Decreased left trace pleural fluid and resolved right pleural effusion. Her day 1 cycle 20 of Enhertu is scheduled on 03/01/2023. Her next treatment after the move will be on November 12th. Her WBC is 5.1, hemoglobin is 13.1, and platelet count is 157,000 as of today. She has a mildly elevated BUN of 19, calcium of 10.3 corrected, and stable AST of 46. I informed her that she can stop her calcium supplements for now. I will see her back in 3 weeks with CBC and CMP. The patient and her son understand the plans discussed today and are in agreement with them.  She knows to contact our office if she develops concerns prior to her next appointment.  I provided 16 minutes of face-to-face time during this this encounter and > 50% was spent counseling as documented under my assessment and plan.    [name], MD  [facility] AT [facility] [address] [place] [address] Dept: [phone] Dept Fax: [phone]   CHIEF COMPLAINT:  CC: An 89 year old female with metastatic breast cancer   Current Treatment:  Enhertu  INTERVAL HISTORY:  Nekeshia is here today for repeat clinical assessment for metastatic breast cancer. She has been on Enhertu since September of  2023 and is tolerating it well. She has been on zoledronic acid since January of 2023, every 6 weeks. Patient states that she feels well and has no complaints of pain. She informed me that she has a appointment with a neurologist on 03/04/2023. She had a CT on 02/17/2023 which showed a good response of her hepatic metastases, with one lesion decreasing from 10mm to 7mm in the right lower lobe and another from 8mm to 4mm in the hepatic dome. She has a irregular hepatic capsule likely due to "pseudocirrhosis" from treated metastatic lesions. There is no evidence of new or progressive disease. The widespread sclerotic osseous metastases are similar to previous. Decreased left trace pleural fluid and resolved right pleural effusion. Her day 1 cycle 20 of Enhertu is scheduled on 03/01/2023. Her next treatment after the move will be on November 12th. Her WBC is 5.1, hemoglobin is 13.1, and platelet count is 157,000 as of today. She has a mildly elevated BUN of 19, calcium of 10.3 corrected, and stable AST of 46. I informed her that she can stop her calcium supplements for now. I will see her back in 3 weeks with CBC and CMP.  She denies signs of infection such as sore throat, sinus drainage, cough, or urinary symptoms.  She denies fevers  or recurrent chills. She denies pain. She denies nausea, vomiting, chest pain, dyspnea or cough. Her appetite is fairly well and her weight has been stable.Her son accompanies her as usual.     I have reviewed the past medical history, past surgical history, social history and family history with the patient and they are unchanged from previous note.  ALLERGIES:  has No Known Allergies.  MEDICATIONS:  Current Outpatient Medications  Medication Sig Dispense Refill   Biotin 1 MG CAPS Take by mouth.     fish oil-omega-3 fatty acids 1000 MG capsule Take 1 g by mouth daily.     gabapentin (NEURONTIN) 100 MG capsule Take 1 capsule (100 mg total) by mouth at bedtime. (Patient not  taking: Reported on 03/05/2022) 30 capsule 5   magnesium citrate SOLN Take 0.5 Bottles by mouth as needed for severe constipation.     Multiple Vitamin (MULTIVITAMIN) capsule Take 1 capsule by mouth daily.     ondansetron (ZOFRAN) 4 MG tablet Take 1 tablet (4 mg total) by mouth every 8 (eight) hours as needed for nausea or vomiting. (Patient not taking: Reported on 03/05/2022) 20 tablet 2   polyethylene glycol (MIRALAX / GLYCOLAX) 17 g packet Take 17 g by mouth as needed. constipation     No current facility-administered medications for this visit.   HISTORY OF PRESENT ILLNESS:   Oncology History Overview Note  Cancer Staging Malignant neoplasm of upper-outer quadrant of left breast in female, estrogen receptor negative ([name]) Staging form: Breast, AJCC 8th Edition - Clinical stage from 10/11/2018: Stage IIB (cT2, cN1, cM0, G3, ER-, PR-, HER2+) - Signed by Malachy Mood, MD on 11/02/2018 - Clinical: No stage assigned - Unsigned    History of colon cancer, stage II  09/2005 Initial Diagnosis   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 Findings were a 5.9 x 3.8 x 1.2 cm moderate to well-differentiated adenocarcinoma penetrating the muscular wall. Forty lymph nodes negative. No vascular or lymphatic invasion. Multiple diverticula with microabscess formation and inflammation. Carcinoma present in at least 1 diverticulum. Preop CEA 5.2 with lab normal range 0 to 2.5. Preop CT scan with no obvious additional pathology.    2007 -  Chemotherapy   She received oral Xeloda chemotherapy as an adjuvant. She declined treatment on a clinical trial.    11/12/2011 Initial Diagnosis   [name], stage II   12/09/2011 Procedure   Followup colonoscopy done on 12/09/2011. She was found to have 2 polyps which were removed. Chronic diverticulosis.     Malignant neoplasm of upper-outer quadrant of left breast in  female, estrogen receptor negative ([name])  09/26/2018 Mammogram   Mammogram/US of left breast 09/26/18 IMPRESSION:  1. 2.9cm irregular mass in the upper outer left breast corresponds to the palpable abnormality. This is highly suspicious for breast carcinoma.  2. Two adjacent abnormal left axillary  Lymph nodes suspicious for metastatic adenopathy. There is a third borderline abnormal left axillary LN with a cortex thickened to 4mm.  3. Benign right breast cyst. No evidence of right breast malignancy.    10/11/2018 Cancer Staging   Staging form: Breast, AJCC 8th Edition - Clinical stage from 10/11/2018: Stage IIB (cT2, cN1, cM0, G3, ER-, PR-, HER2+) - Signed by Malachy Mood, MD on 11/02/2018   10/11/2018 Initial Biopsy   Diagnosis 10/11/18 1. Breast, left, needle core biopsy, upper outer left 2 o'clock - INVASIVE DUCTAL  CARCINOMA. - DUCTAL CARCINOMA IN SITU. - LYMPHOVASCULAR INVASION IS IDENTIFIED. - SEE COMMENT. 2. Lymph node, needle/core biopsy, left axilla - INVASIVE DUCTAL CARCINOMA. - SEE COMMENT.   10/11/2018 Receptors her2   The tumor cells are POSITIVE for Her2 (3+). Estrogen Receptor: 0%, NEGATIVE Progesterone Receptor: 0%, NEGATIVE Proliferation Marker Ki67: 70%   11/02/2018 Initial Diagnosis   Malignant neoplasm of upper-outer quadrant of left breast in female, estrogen receptor negative ([name])   11/15/2018 Breast MRI   MRI breast 11/15/18  IMPRESSION: 1. 3.3 centimeter mass in the LATERAL portion of the LEFT breast consistent with known malignancy. 2. There is significant non mass enhancement surrounding this mass and extending anteriorly into the nipple base, with largest diameter in the anterior to posterior axis, measuring 7.2 centimeters. 3. If the patient would consider breast conservation, additional MR guided core biopsies are recommended. Consider biopsy of the inferior and anterior extent of the non mass enhancement to document extent of disease. 4. Three enlarged LEFT  axillary lymph nodes. 5. RIGHT breast is negative.   11/15/2018 PET scan   PET 11/15/18 IMPRESSION: Hypermetabolic left breast lesion compatible with known primary. Hypermetabolic left axillary lymph nodes are consistent with metastatic disease.   No evidence for additional hypermetabolic metastatic involvement in the neck, chest, abdomen, or pelvis.   11/17/2018 - 03/01/2019 Chemotherapy   Neo-adjuvant Kadcyla and perjeta q3weeks for 6 cycles starting 11/17/18. Stopped before surgery    11/29/2018 Pathology Results   Diagnosis 1. Breast, left, needle core biopsy, inferior anterior (cylinder clip) - INVASIVE DUCTAL CARCINOMA. - DUCTAL CARCINOMA IN SITU. - LYMPHOVASCULAR INVASION IS IDENTIFIED. - SEE COMMENT. 2. Breast, left, needle core biopsy, central posterior (barbell clip) - INVASIVE DUCTAL CARCINOMA. - LYMPHOVASCULAR INVASION IS IDENTIFIED.   02/12/2019 Breast MRI   IMPRESSION: 1. Complete resolution of previously identified enhancing mass and associated non mass enhancement within the left breast. This is consistent with excellent response to chemotherapy. No residual or suspicious findings are identified. 2. No MRI evidence of malignancy on the right. 3. Previously identified left axillary lymphadenopathy not definitively seen on today's study. However, this may be due to decreased field-of-view compared to prior study.     03/14/2019 Cancer Staging   Staging form: Breast, AJCC 8th Edition - Pathologic stage from 03/14/2019: pT0, pN0, cM0, GX, ER: Unknown, PR: Unknown, HER2: Not Assessed - Signed by Malachy Mood, MD on 03/28/2019   03/14/2019 Surgery   LEFT MASTECTOMY WITH TARGETED LYMPH NODE DISSECTION and Left Axillary Sentinel Lymph  Node Biopsy by Dr Carolynne Edouard 03/14/19    03/14/2019 Pathology Results   FINAL MICROSCOPIC DIAGNOSIS:   A. LYMPH NODE, LEFT, SENTINEL, BIOPSY:  - There is no evidence of carcinoma in 1 of 1 lymph node (0/1).   B. BREAST, LEFT, MASTECTOMY:  -  Benign breast parenchyma with treatment-related changes.  - There is no evidence of malignancy.  - See oncology table below.   C. LYMPH NODE, LEFT #1, SENTINEL, BIOPSY:  - There is no evidence of carcinoma in 1 of 1 lymph node (0/1).   D. LYMPH NODE, LEFT #2, SENTINEL, BIOPSY:  - There is no evidence of carcinoma in 1 of 1 lymph node (0/1).     04/04/2019 - 04/25/2019 Chemotherapy   Maintenance Herceptin injections every 3 weeks starting 04/03/19 to complete 1 year of treatment that was started in 11/2018. Stopped after 2nd dose as she will not be repeating Echos.    06/03/2021 - 06/24/2021 Chemotherapy   Patient is on Treatment  Plan : BREAST Trastuzumab q21d X 11 Cycles     06/25/2021 - 01/01/2022 Chemotherapy   Patient is on Treatment Plan : BREAST Docetaxel + Trastuzumab + Pertuzumab (THP) q21d     06/25/2021 - 01/02/2022 Chemotherapy   Patient is on Treatment Plan : BREAST Docetaxel + Trastuzumab + Pertuzumab (THP) q21d     09/14/2021 Imaging   CT chest/abdomen/pelvis  IMPRESSION:  1. Interval resolution of previously seen bulky left axillary and  subpectoral lymphadenopathy. No persistently enlarged lymph nodes.  2. Multiple liver lesions are significantly diminished in size.  3. Widespread osseous metastatic disease, with an interval increase  in sclerosis of several previously lytic lesions.  4. Constellation of findings is consistent with treatment response  of metastatic disease  5. New, although age indeterminate pathologic wedge deformity of the  T6 vertebral body as well as an increased pathologic wedge deformity  of the L2 vertebral body.  6. Coronary artery disease.  Aortic Atherosclerosis (ICD10-I70.0).    01/26/2022 -  Chemotherapy   Patient is on Treatment Plan : BREAST METASTATIC Fam-Trastuzumab Deruxtecan-nxki (Enhertu) (5.4) q21d     Malignant neoplasm metastatic to liver ([name])  04/17/2021 Imaging   CT ABDOMEN AND PELVIS WITH CONTRAST: -New lytic lesion  involving the L2 vertebral body (6:72, 2:26) with minimal cortical breakthrough superiorly, but with preservation of vertebral body height, concerning for metastatic disease.  -There are several indeterminate hepatic lesions as described above. In addition to the lesions described above, there is an additional subtle 1.5 cm hypodensity in the hepatic dome (2:7). Given clinical history and presence of a new L2 lesion, leading differential consideration is metastatic disease. Recommend further evaluation with MRI of the abdomen with and without contrast.   05/12/2021 Imaging   MRI ABDOMEN WITH AND WITHOUT CONTRAST: Numerous heterogeneously enhancing, internally necrotic lesions in the right hepatic lobe, highly concerning for metastatic disease. Largest lesion measures up to 6.7 cm in hepatic segment VI.   Enhancing osseous lesions in the L2 vertebral body, both iliac bones, and in the sacrum, highly concerning for osseous metastases.   05/15/2021 Initial Diagnosis   Liver metastases ([name])   05/27/2021 PET scan   1. Widespread recurrent/metastatic disease in this patient who is  status post bilateral mastectomy. Left chest wall recurrence with  left axillary/subpectoral, supraclavicular nodal metastasis.  2. Hepatic, left adrenal, and widespread osseous metastasis.  3. Incidental findings, including: Right nephrolithiasis. Tiny  hiatal hernia.    05/27/2021 Imaging   MRI LUMBAR AND THORACIC SPINE: Thoracic spine:  Osseous metastatic disease involving each level. The most dramatic deposits are at T4 and T6 where there is extraosseous/epidural tumor. No cord compression but there is foraminal impingement on the left at T6-7 and on the right at T3-4. Early dorsal epidural tumor at the level of T10 and T3.   Lumbar spine:  1. Widespread osseous metastatic disease with largest deposit  replacing the L2 body where there is a compression fracture with  mild height loss. Also at this level is early  epidural tumor  extension likely affecting both L2-3 foramina.  2. 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    05/27/2021 Imaging   MRI HEAD WITH AND WITHOUT CONTRAST: Negative for metastatic disease to the brain or calvarium.   06/03/2021 - 06/24/2021 Chemotherapy   Patient is on Treatment Plan : BREAST Trastuzumab q21d X 11 Cycles     06/25/2021 - 01/01/2022 Chemotherapy   Patient is on Treatment Plan : BREAST Docetaxel +  Trastuzumab + Pertuzumab (THP) q21d     06/25/2021 - 01/02/2022 Chemotherapy   Patient is on Treatment Plan : BREAST Docetaxel + Trastuzumab + Pertuzumab (THP) q21d     09/14/2021 Imaging   CT chest/abdomen/pelvis  IMPRESSION:  1. Interval resolution of previously seen bulky left axillary and  subpectoral lymphadenopathy. No persistently enlarged lymph nodes.  2. Multiple liver lesions are significantly diminished in size.  3. Widespread osseous metastatic disease, with an interval increase  in sclerosis of several previously lytic lesions.  4. Constellation of findings is consistent with treatment response  of metastatic disease  5. New, although age indeterminate pathologic wedge deformity of the  T6 vertebral body as well as an increased pathologic wedge deformity  of the L2 vertebral body.  6. Coronary artery disease.  Aortic Atherosclerosis (ICD10-I70.0).    01/26/2022 -  Chemotherapy   Patient is on Treatment Plan : BREAST METASTATIC Fam-Trastuzumab Deruxtecan-nxki (Enhertu) (5.4) q21d     Malignant neoplasm metastatic to bone ([name])  04/17/2021 Imaging   CT ABDOMEN AND PELVIS WITH CONTRAST: -New lytic lesion involving the L2 vertebral body (6:72, 2:26) with minimal cortical breakthrough superiorly, but with preservation of vertebral body height, concerning for metastatic disease.  -There are several indeterminate hepatic lesions as described above. In addition to the lesions described above, there is an additional subtle 1.5 cm  hypodensity in the hepatic dome (2:7). Given clinical history and presence of a new L2 lesion, leading differential consideration is metastatic disease. Recommend further evaluation with MRI of the abdomen with and without contrast.   05/12/2021 Imaging   MRI ABDOMEN WITH AND WITHOUT CONTRAST: Numerous heterogeneously enhancing, internally necrotic lesions in the right hepatic lobe, highly concerning for metastatic disease. Largest lesion measures up to 6.7 cm in hepatic segment VI.   Enhancing osseous lesions in the L2 vertebral body, both iliac bones, and in the sacrum, highly concerning for osseous metastases.   05/15/2021 Initial Diagnosis   Bone metastases ([name])   05/27/2021 PET scan   1. Widespread recurrent/metastatic disease in this patient who is  status post bilateral mastectomy. Left chest wall recurrence with  left axillary/subpectoral, supraclavicular nodal metastasis.  2. Hepatic, left adrenal, and widespread osseous metastasis.  3. Incidental findings, including: Right nephrolithiasis. Tiny  hiatal hernia.    05/27/2021 Imaging   MRI LUMBAR AND THORACIC SPINE: Thoracic spine:  Osseous metastatic disease involving each level. The most dramatic deposits are at T4 and T6 where there is extraosseous/epidural tumor. No cord compression but there is foraminal impingement on the left at T6-7 and on the right at T3-4. Early dorsal epidural tumor at the level of T10 and T3.   Lumbar spine:  1. Widespread osseous metastatic disease with largest deposit  replacing the L2 body where there is a compression fracture with  mild height loss. Also at this level is early epidural tumor  extension likely affecting both L2-3 foramina.  2. 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    05/27/2021 Imaging   MRI HEAD WITH AND WITHOUT CONTRAST: Negative for metastatic disease to the brain or calvarium.   06/03/2021 - 06/24/2021 Chemotherapy   Patient is on Treatment Plan  : BREAST Trastuzumab q21d X 11 Cycles     06/25/2021 - 01/01/2022 Chemotherapy   Patient is on Treatment Plan : BREAST Docetaxel + Trastuzumab + Pertuzumab (THP) q21d     06/25/2021 - 01/02/2022 Chemotherapy   Patient is on Treatment Plan : BREAST Docetaxel + Trastuzumab + Pertuzumab (  THP) q21d     09/14/2021 Imaging   CT chest/abdomen/pelvis  IMPRESSION:  1. Interval resolution of previously seen bulky left axillary and  subpectoral lymphadenopathy. No persistently enlarged lymph nodes.  2. Multiple liver lesions are significantly diminished in size.  3. Widespread osseous metastatic disease, with an interval increase  in sclerosis of several previously lytic lesions.  4. Constellation of findings is consistent with treatment response  of metastatic disease  5. New, although age indeterminate pathologic wedge deformity of the  T6 vertebral body as well as an increased pathologic wedge deformity  of the L2 vertebral body.  6. Coronary artery disease.  Aortic Atherosclerosis (ICD10-I70.0).    01/26/2022 -  Chemotherapy   Patient is on Treatment Plan : BREAST METASTATIC Fam-Trastuzumab Deruxtecan-nxki (Enhertu) (5.4) q21d       REVIEW OF SYSTEMS:   Review of Systems  Constitutional: Negative.  Negative for appetite change, chills, diaphoresis, fatigue, fever and unexpected weight change.  HENT:  Negative.  Negative for hearing loss, lump/mass, mouth sores, nosebleeds, sore throat, tinnitus, trouble swallowing and voice change.   Eyes:  Positive for eye problems (tearing/glaze of the eye). Negative for icterus.  Respiratory: Negative.  Negative for chest tightness, cough, hemoptysis, shortness of breath and wheezing.   Cardiovascular: Negative.  Negative for chest pain, leg swelling and palpitations.  Gastrointestinal:  Negative for abdominal distention, abdominal pain, blood in stool, constipation, diarrhea, nausea, rectal pain and vomiting.  Endocrine: Negative.   Genitourinary:  Negative.  Negative for bladder incontinence, difficulty urinating, dyspareunia, dysuria, frequency, hematuria, menstrual problem, nocturia, pelvic pain, vaginal bleeding and vaginal discharge.   Musculoskeletal:  Negative for arthralgias, back pain, flank pain, gait problem, myalgias, neck pain and neck stiffness.       Chronic left hand/nerve pain  Skin: Negative.  Negative for itching, rash and wound.  Neurological:  Positive for extremity weakness (of her left hand) and numbness (severe paresthesias inability to use her left hand). Negative for dizziness, gait problem, headaches, light-headedness, seizures and speech difficulty.  Hematological: Negative.  Negative for adenopathy. Does not bruise/bleed easily.  Psychiatric/Behavioral: Negative.  Negative for confusion, decreased concentration, depression, sleep disturbance and suicidal ideas. The patient is not nervous/anxious.    VITALS:  Blood pressure (!) 168/71, pulse 81, temperature 97.6 F (36.4 C), temperature source Oral, resp. rate 18, height 5' 0.25" (1.53 m), weight 109 lb 4.8 oz (49.6 kg), SpO2 97%.  Wt Readings from Last 3 Encounters:  03/01/23 107 lb (48.5 kg)  02/24/23 109 lb 4.8 oz (49.6 kg)  02/08/23 110 lb (49.9 kg)    Body mass index is 21.17 kg/m.  Performance status (ECOG): 1 - Symptomatic but completely ambulatory  PHYSICAL EXAM:  Physical Exam Vitals and nursing note reviewed. Exam conducted with a chaperone present.  Constitutional:      General: She is not in acute distress.    Appearance: Normal appearance. She is normal weight. She is not ill-appearing, toxic-appearing or diaphoretic.  HENT:     Head: Normocephalic and atraumatic.     Right Ear: Tympanic membrane, ear canal and external ear normal. There is no impacted cerumen.     Left Ear: Tympanic membrane, ear canal and external ear normal. There is no impacted cerumen.     Nose: Nose normal. No congestion or rhinorrhea.     Mouth/Throat:     Mouth:  Mucous membranes are moist.     Pharynx: Oropharynx is clear. No oropharyngeal exudate or posterior oropharyngeal erythema.  Eyes:  General: No scleral icterus.       Right eye: No discharge.        Left eye: No discharge.     Extraocular Movements: Extraocular movements intact.     Conjunctiva/sclera: Conjunctivae normal.     Pupils: Pupils are equal, round, and reactive to light.  Neck:     Vascular: No carotid bruit.  Cardiovascular:     Rate and Rhythm: Normal rate and regular rhythm.     Pulses: Normal pulses.     Heart sounds: Normal heart sounds. No murmur heard.    No friction rub. No gallop.  Pulmonary:     Effort: Pulmonary effort is normal. No respiratory distress.     Breath sounds: Normal breath sounds. No stridor. No wheezing, rhonchi or rales.  Chest:     Chest wall: No tenderness.  Breasts:    Right: Absent.     Left: Absent.     Comments: Left mastectomy is negative. Right breast is without masses In the medial left upper chest there is a area of erythema and irritation of the skin. Abdominal:     General: Bowel sounds are normal. There is no distension.     Palpations: Abdomen is soft. There is no mass.     Tenderness: There is no abdominal tenderness. There is no right CVA tenderness, left CVA tenderness, guarding or rebound.     Hernia: No hernia is present.  Musculoskeletal:        General: No swelling, tenderness, deformity or signs of injury. Normal range of motion.     Left upper arm: Edema present.     Left forearm: Edema present.     Cervical back: Normal range of motion and neck supple. No rigidity or tenderness.     Right lower leg: No edema.     Left lower leg: No edema.     Comments: 1+ lymphedema of the left upper extremity   Lymphadenopathy:     Cervical: No cervical adenopathy.  Skin:    General: Skin is warm and dry.     Coloration: Skin is not jaundiced or pale.     Findings: No bruising, erythema (mild), lesion or rash.   Neurological:     Mental Status: She is alert and oriented to person, place, and time. Mental status is at baseline.     Cranial Nerves: No cranial nerve deficit.     Sensory: No sensory deficit.     Motor: Weakness (She has severe paresthesias of the left upper extremity with inability to use her left hand) present.     Coordination: Coordination normal.     Gait: Gait normal.     Deep Tendon Reflexes: Reflexes normal.  Psychiatric:        Mood and Affect: Mood normal.        Behavior: Behavior normal.        Thought Content: Thought content normal.        Judgment: Judgment normal.    LABORATORY DATA:  I have reviewed the data as listed    Component Value Date/Time   NA 138 02/24/2023 0000   NA 137 11/13/2012 1057   K 4.2 02/24/2023 0000   K 4.5 11/13/2012 1057   CL 105 02/24/2023 0000   CO2 26 (A) 02/24/2023 0000   CO2 26 11/13/2012 1057   GLUCOSE 93 02/02/2023 1348   GLUCOSE 90 11/13/2012 1057   BUN 18 02/24/2023 0000   BUN 18.3 11/13/2012 1057   CREATININE  0.7 02/24/2023 0000   CREATININE 0.71 02/02/2023 1348   CREATININE 0.8 11/13/2012 1057   CALCIUM 10.3 02/24/2023 0000   CALCIUM 9.7 11/13/2012 1057   PROT 6.5 02/02/2023 1348   PROT 7.4 11/13/2012 1057   ALBUMIN 3.9 02/24/2023 0000   ALBUMIN 3.9 11/13/2012 1057   AST 46 (A) 02/24/2023 0000   AST 33 02/02/2023 1348   AST 22 11/13/2012 1057   ALT 21 02/24/2023 0000   ALT 18 02/02/2023 1348   ALT 15 11/13/2012 1057   ALKPHOS 59 02/24/2023 0000   ALKPHOS 77 11/13/2012 1057   BILITOT 0.5 02/02/2023 1348   BILITOT 0.54 11/13/2012 1057   GFRNONAA >60 02/02/2023 1348   GFRNONAA >60 09/09/2022 0000   GFRAA >60 04/25/2019 1045   No results found for: "SPEP", "UPEP"  Lab Results  Component Value Date   WBC 5.1 02/24/2023   NEUTROABS 2.60 02/24/2023   HGB 13.1 02/24/2023   HCT 38 02/24/2023   MCV 104.1 (H) 02/02/2023   PLT 157 02/24/2023     Chemistry      Component Value Date/Time   NA 138 02/24/2023  0000   NA 137 11/13/2012 1057   K 4.2 02/24/2023 0000   K 4.5 11/13/2012 1057   CL 105 02/24/2023 0000   CO2 26 (A) 02/24/2023 0000   CO2 26 11/13/2012 1057   BUN 18 02/24/2023 0000   BUN 18.3 11/13/2012 1057   CREATININE 0.7 02/24/2023 0000   CREATININE 0.71 02/02/2023 1348   CREATININE 0.8 11/13/2012 1057   GLU 117 02/24/2023 0000      Component Value Date/Time   CALCIUM 10.3 02/24/2023 0000   CALCIUM 9.7 11/13/2012 1057   ALKPHOS 59 02/24/2023 0000   ALKPHOS 77 11/13/2012 1057   AST 46 (A) 02/24/2023 0000   AST 33 02/02/2023 1348   AST 22 11/13/2012 1057   ALT 21 02/24/2023 0000   ALT 18 02/02/2023 1348   ALT 15 11/13/2012 1057   BILITOT 0.5 02/02/2023 1348   BILITOT 0.54 11/13/2012 1057     RADIOGRAPHIC STUDIES: Exam: 02/17/2023  CT Chest, Abdomen, and Pelvis Impression: 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  No new or progressive disease. Widespread sclerotic osseous metastasis, similar.  Decreased trace left pleural fluid. Resolved right pleural effusion. Incidental findings, including: Coronary artery Atherosclerosis. Aortic Atherosclerosis (ICD10-170.0)       I,Jasmine M Lassiter,acting as a scribe for [name], MD.,have documented all relevant documentation on the behalf of [name], MD,as directed by  [name], MD while in the presence of [name], MD.

## 2023-02-24 ENCOUNTER — Encounter: Payer: Self-pay | Admitting: Oncology

## 2023-02-24 ENCOUNTER — Inpatient Hospital Stay: Payer: Medicare Other | Admitting: Oncology

## 2023-02-24 ENCOUNTER — Inpatient Hospital Stay: Payer: Medicare Other

## 2023-02-24 VITALS — BP 168/71 | HR 81 | Temp 97.6°F | Resp 18 | Ht 60.25 in | Wt 109.3 lb

## 2023-02-24 DIAGNOSIS — C7951 Secondary malignant neoplasm of bone: Secondary | ICD-10-CM

## 2023-02-24 DIAGNOSIS — C50412 Malignant neoplasm of upper-outer quadrant of left female breast: Secondary | ICD-10-CM

## 2023-02-24 DIAGNOSIS — Z171 Estrogen receptor negative status [ER-]: Secondary | ICD-10-CM

## 2023-02-24 DIAGNOSIS — C787 Secondary malignant neoplasm of liver and intrahepatic bile duct: Secondary | ICD-10-CM

## 2023-02-24 LAB — BASIC METABOLIC PANEL
BUN: 19 (ref 4–21)
CO2: 26 — AB (ref 13–22)
Chloride: 105 (ref 99–108)
Creatinine: 0.7 (ref 0.5–1.1)
Glucose: 117
Potassium: 4.2 meq/L (ref 3.5–5.1)
Sodium: 138 (ref 137–147)

## 2023-02-24 LAB — CBC AND DIFFERENTIAL
HCT: 38 (ref 36–46)
Hemoglobin: 13.1 (ref 12.0–16.0)
Neutrophils Absolute: 2.6
Platelets: 157 10*3/uL (ref 150–400)
WBC: 5.1

## 2023-02-24 LAB — CBC: RBC: 3.63 — AB (ref 3.87–5.11)

## 2023-02-24 LAB — HEPATIC FUNCTION PANEL
ALT: 21 U/L (ref 7–35)
AST: 46 — AB (ref 13–35)
Alkaline Phosphatase: 59 (ref 25–125)
Bilirubin, Total: 0.5

## 2023-02-24 LAB — COMPREHENSIVE METABOLIC PANEL
Albumin: 3.9 (ref 3.5–5.0)
Calcium: 10.2 (ref 8.7–10.7)

## 2023-02-26 ENCOUNTER — Other Ambulatory Visit: Payer: Self-pay

## 2023-02-28 MED FILL — Fam-Trastuzumab Deruxtecan-nxki For IV Soln 100 MG: INTRAVENOUS | Qty: 10 | Status: AC

## 2023-02-28 MED FILL — Dexamethasone Sodium Phosphate Inj 100 MG/10ML: INTRAMUSCULAR | Qty: 1 | Status: AC

## 2023-02-28 MED FILL — Zoledronic Acid Inj Conc For IV Infusion 4 MG/5ML: INTRAVENOUS | Qty: 3.75 | Status: AC

## 2023-03-01 ENCOUNTER — Inpatient Hospital Stay: Payer: Medicare Other

## 2023-03-01 VITALS — BP 159/74 | HR 77 | Temp 98.0°F | Resp 18 | Wt 107.0 lb

## 2023-03-01 DIAGNOSIS — C787 Secondary malignant neoplasm of liver and intrahepatic bile duct: Secondary | ICD-10-CM

## 2023-03-01 DIAGNOSIS — Z5112 Encounter for antineoplastic immunotherapy: Secondary | ICD-10-CM | POA: Diagnosis not present

## 2023-03-01 DIAGNOSIS — Z171 Estrogen receptor negative status [ER-]: Secondary | ICD-10-CM

## 2023-03-01 DIAGNOSIS — C7951 Secondary malignant neoplasm of bone: Secondary | ICD-10-CM

## 2023-03-01 MED ORDER — DIPHENHYDRAMINE HCL 25 MG PO CAPS
25.0000 mg | ORAL_CAPSULE | Freq: Once | ORAL | Status: AC
  Administered 2023-03-01: 25 mg via ORAL
  Filled 2023-03-01: qty 1

## 2023-03-01 MED ORDER — SODIUM CHLORIDE 0.9% FLUSH: 10.0000 mL | INTRAVENOUS | Status: DC | PRN

## 2023-03-01 MED ORDER — PALONOSETRON HCL INJECTION 0.25 MG/5ML
0.2500 mg | Freq: Once | INTRAVENOUS | Status: AC
Start: 2023-03-01 — End: 2023-03-01
  Administered 2023-03-01: 0.25 mg via INTRAVENOUS
  Filled 2023-03-01: qty 5

## 2023-03-01 MED ORDER — HEPARIN SOD (PORK) LOCK FLUSH 100 UNIT/ML IV SOLN: 500.0000 [IU] | Freq: Once | INTRAVENOUS | Status: DC | PRN

## 2023-03-01 MED ORDER — ZOLEDRONIC ACID 4 MG/5ML IV CONC
3.0000 mg | Freq: Once | INTRAVENOUS | Status: AC
  Administered 2023-03-01: 3 mg via INTRAVENOUS
  Filled 2023-03-01: qty 3.75

## 2023-03-01 MED ORDER — DEXAMETHASONE SODIUM PHOSPHATE 100 MG/10ML IJ SOLN
10.0000 mg | Freq: Once | INTRAMUSCULAR | Status: AC
  Administered 2023-03-01: 10 mg via INTRAVENOUS
  Filled 2023-03-01: qty 10

## 2023-03-01 MED ORDER — FAM-TRASTUZUMAB DERUXTECAN-NXKI CHEMO 100 MG IV SOLR
4.0500 mg/kg | Freq: Once | INTRAVENOUS | Status: AC
  Administered 2023-03-01: 200 mg via INTRAVENOUS
  Filled 2023-03-01: qty 10

## 2023-03-01 MED ORDER — ACETAMINOPHEN 325 MG PO TABS
650.0000 mg | ORAL_TABLET | Freq: Once | ORAL | Status: AC
  Administered 2023-03-01: 650 mg via ORAL
  Filled 2023-03-01: qty 2

## 2023-03-01 MED ORDER — DEXTROSE 5 % IV SOLN: Freq: Once | INTRAVENOUS | Status: AC

## 2023-03-01 NOTE — Patient Instructions (Signed)
Zoledronic Acid Injection (Cancer) What is this medication? ZOLEDRONIC ACID (ZOE le dron ik AS id) treats high calcium levels in the blood caused by cancer. It may also be used with chemotherapy to treat weakened bones caused by cancer. It works by slowing down the release of calcium from bones. This lowers calcium levels in your blood. It also makes your bones stronger and less likely to break (fracture). It belongs to a group of medications called bisphosphonates. This medicine may be used for other purposes; ask your health care provider or pharmacist if you have questions. COMMON BRAND NAME(S): Zometa, Zometa Powder What should I tell my care team before I take this medication? They need to know if you have any of these conditions: Dehydration Dental disease Kidney disease Liver disease Low levels of calcium in the blood Lung or breathing disease, such as asthma Receiving steroids, such as dexamethasone or prednisone An unusual or allergic reaction to zoledronic acid, other medications, foods, dyes, or preservatives Pregnant or trying to get pregnant Breast-feeding How should I use this medication? This medication is injected into a vein. It is given by your care team in a hospital or clinic setting. Talk to your care team about the use of this medication in children. Special care may be needed. Overdosage: If you think you have taken too much of this medicine contact a poison control center or emergency room at once. NOTE: This medicine is only for you. Do not share this medicine with others. What if I miss a dose? Keep appointments for follow-up doses. It is important not to miss your dose. Call your care team if you are unable to keep an appointment. What may interact with this medication? Certain antibiotics given by injection Diuretics, such as bumetanide, furosemide NSAIDs, medications for pain and inflammation, such as ibuprofen or naproxen Teriparatide Thalidomide This list  may not describe all possible interactions. Give your health care provider a list of all the medicines, herbs, non-prescription drugs, or dietary supplements you use. Also tell them if you smoke, drink alcohol, or use illegal drugs. Some items may interact with your medicine. What should I watch for while using this medication? Visit your care team for regular checks on your progress. It may be some time before you see the benefit from this medication. Some people who take this medication have severe bone, joint, or muscle pain. This medication may also increase your risk for jaw problems or a broken thigh bone. Tell your care team right away if you have severe pain in your jaw, bones, joints, or muscles. Tell you care team if you have any pain that does not go away or that gets worse. Tell your dentist and dental surgeon that you are taking this medication. You should not have major dental surgery while on this medication. See your dentist to have a dental exam and fix any dental problems before starting this medication. Take good care of your teeth while on this medication. Make sure you see your dentist for regular follow-up appointments. You should make sure you get enough calcium and vitamin D while you are taking this medication. Discuss the foods you eat and the vitamins you take with your care team. Check with your care team if you have severe diarrhea, nausea, and vomiting, or if you sweat a lot. The loss of too much body fluid may make it dangerous for you to take this medication. You may need bloodwork while taking this medication. Talk to your care team if  you wish to become pregnant or think you might be pregnant. This medication can cause serious birth defects. What side effects may I notice from receiving this medication? Side effects that you should report to your care team as soon as possible: Allergic reactions--skin rash, itching, hives, swelling of the face, lips, tongue, or  throat Kidney injury--decrease in the amount of urine, swelling of the ankles, hands, or feet Low calcium level--muscle pain or cramps, confusion, tingling, or numbness in the hands or feet Osteonecrosis of the jaw--pain, swelling, or redness in the mouth, numbness of the jaw, poor healing after dental work, unusual discharge from the mouth, visible bones in the mouth Severe bone, joint, or muscle pain Side effects that usually do not require medical attention (report to your care team if they continue or are bothersome): Constipation Fatigue Fever Loss of appetite Nausea Stomach pain This list may not describe all possible side effects. Call your doctor for medical advice about side effects. You may report side effects to FDA at 1-800-FDA-1088. Where should I keep my medication? This medication is given in a hospital or clinic. It will not be stored at home. NOTE: This sheet is a summary. It may not cover all possible information. If you have questions about this medicine, talk to your doctor, pharmacist, or health care provider.  2024 Elsevier/Gold Standard (2021-06-19 00:00:00) Fam-Trastuzumab Deruxtecan Injection What is this medication? FAM-TRASTUZUMAB DERUXTECAN (fam-tras TOOZ eu mab DER ux TEE kan) treats some types of cancer. It works by blocking a protein that causes cancer cells to grow and multiply. This helps to slow or stop the spread of cancer cells. This medicine may be used for other purposes; ask your health care provider or pharmacist if you have questions. COMMON BRAND NAME(S): ENHERTU What should I tell my care team before I take this medication? They need to know if you have any of these conditions: Heart disease Heart failure Infection, especially a viral infection, such as chickenpox, cold sores, or herpes Liver disease Lung or breathing disease, such as asthma or COPD An unusual or allergic reaction to fam-trastuzumab deruxtecan, other medications, foods, dyes,  or preservatives Pregnant or trying to get pregnant Breast-feeding How should I use this medication? This medication is injected into a vein. It is given by your care team in a hospital or clinic setting. A special MedGuide will be given to you before each treatment. Be sure to read this information carefully each time. Talk to your care team about the use of this medication in children. Special care may be needed. Overdosage: If you think you have taken too much of this medicine contact a poison control center or emergency room at once. NOTE: This medicine is only for you. Do not share this medicine with others. What if I miss a dose? It is important not to miss your dose. Call your care team if you are unable to keep an appointment. What may interact with this medication? Interactions are not expected. This list may not describe all possible interactions. Give your health care provider a list of all the medicines, herbs, non-prescription drugs, or dietary supplements you use. Also tell them if you smoke, drink alcohol, or use illegal drugs. Some items may interact with your medicine. What should I watch for while using this medication? Visit your care team for regular checks on your progress. Tell your care team if your symptoms do not start to get better or if they get worse. Your condition will be monitored carefully  while you are receiving this medication. Do not become pregnant while taking this medication or for 7 months after stopping it. Women should inform their care team if they wish to become pregnant or think they might be pregnant. Men should not father a child while taking this medication and for 4 months after stopping it. There is potential for serious side effects to an unborn child. Talk to your care team for more information. Do not breast-feed an infant while taking this medication or for 7 months after the last dose. This medication has caused decreased sperm counts in some  men. This may make it more difficult to father a child. Talk to your care team if you are concerned about your fertility. This medication may increase your risk to bruise or bleed. Call your care team if you notice any unusual bleeding. Be careful brushing or flossing your teeth or using a toothpick because you may get an infection or bleed more easily. If you have any dental work done, tell your dentist you are receiving this medication. This medication may cause dry eyes and blurred vision. If you wear contact lenses, you may feel some discomfort. Lubricating eye drops may help. See your care team if the problem does not go away or is severe. This medication may increase your risk of getting an infection. Call your care team for advice if you get a fever, chills, sore throat, or other symptoms of a cold or flu. Do not treat yourself. Try to avoid being around people who are sick. Avoid taking medications that contain aspirin, acetaminophen, ibuprofen, naproxen, or ketoprofen unless instructed by your care team. These medications may hide a fever. What side effects may I notice from receiving this medication? Side effects that you should report to your care team as soon as possible: Allergic reactions--skin rash, itching, hives, swelling of the face, lips, tongue, or throat Dry cough, shortness of breath or trouble breathing Infection--fever, chills, cough, sore throat, wounds that don't heal, pain or trouble when passing urine, general feeling of discomfort or being unwell Heart failure--shortness of breath, swelling of the ankles, feet, or hands, sudden weight gain, unusual weakness or fatigue Unusual bruising or bleeding Side effects that usually do not require medical attention (report these to your care team if they continue or are bothersome): Constipation Diarrhea Hair loss Muscle pain Nausea Vomiting This list may not describe all possible side effects. Call your doctor for medical  advice about side effects. You may report side effects to FDA at 1-800-FDA-1088. Where should I keep my medication? This medication is given in a hospital or clinic. It will not be stored at home. NOTE: This sheet is a summary. It may not cover all possible information. If you have questions about this medicine, talk to your doctor, pharmacist, or health care provider.  2024 [employer]/Gold Standard (2021-02-10 00:00:00)

## 2023-03-09 ENCOUNTER — Encounter: Payer: Self-pay | Admitting: Oncology

## 2023-03-14 ENCOUNTER — Encounter: Payer: Self-pay | Admitting: Oncology

## 2023-03-16 ENCOUNTER — Encounter: Payer: Self-pay | Admitting: Oncology

## 2023-03-21 ENCOUNTER — Encounter: Payer: Self-pay | Admitting: Oncology

## 2023-03-21 ENCOUNTER — Encounter: Payer: Self-pay | Admitting: Hematology and Oncology

## 2023-03-22 ENCOUNTER — Inpatient Hospital Stay: Payer: Medicare Other

## 2023-03-22 ENCOUNTER — Encounter: Payer: Self-pay | Admitting: Oncology

## 2023-03-22 ENCOUNTER — Telehealth: Payer: Self-pay | Admitting: Hematology and Oncology

## 2023-03-22 ENCOUNTER — Inpatient Hospital Stay (HOSPITAL_BASED_OUTPATIENT_CLINIC_OR_DEPARTMENT_OTHER): Payer: Medicare Other | Admitting: Hematology and Oncology

## 2023-03-22 ENCOUNTER — Inpatient Hospital Stay: Payer: Medicare Other | Attending: Oncology

## 2023-03-22 VITALS — BP 127/81 | HR 96 | Temp 97.8°F | Resp 18 | Ht 60.25 in | Wt 110.0 lb

## 2023-03-22 VITALS — BP 129/74 | HR 77 | Temp 98.0°F | Resp 18

## 2023-03-22 DIAGNOSIS — G629 Polyneuropathy, unspecified: Secondary | ICD-10-CM | POA: Insufficient documentation

## 2023-03-22 DIAGNOSIS — C50412 Malignant neoplasm of upper-outer quadrant of left female breast: Secondary | ICD-10-CM | POA: Diagnosis not present

## 2023-03-22 DIAGNOSIS — Z9012 Acquired absence of left breast and nipple: Secondary | ICD-10-CM | POA: Diagnosis not present

## 2023-03-22 DIAGNOSIS — Z171 Estrogen receptor negative status [ER-]: Secondary | ICD-10-CM | POA: Diagnosis not present

## 2023-03-22 DIAGNOSIS — C7951 Secondary malignant neoplasm of bone: Secondary | ICD-10-CM | POA: Insufficient documentation

## 2023-03-22 DIAGNOSIS — K5900 Constipation, unspecified: Secondary | ICD-10-CM | POA: Diagnosis not present

## 2023-03-22 DIAGNOSIS — C787 Secondary malignant neoplasm of liver and intrahepatic bile duct: Secondary | ICD-10-CM | POA: Insufficient documentation

## 2023-03-22 DIAGNOSIS — Z79899 Other long term (current) drug therapy: Secondary | ICD-10-CM | POA: Insufficient documentation

## 2023-03-22 DIAGNOSIS — Z5112 Encounter for antineoplastic immunotherapy: Secondary | ICD-10-CM | POA: Diagnosis present

## 2023-03-22 DIAGNOSIS — Z85038 Personal history of other malignant neoplasm of large intestine: Secondary | ICD-10-CM | POA: Insufficient documentation

## 2023-03-22 DIAGNOSIS — I972 Postmastectomy lymphedema syndrome: Secondary | ICD-10-CM | POA: Diagnosis not present

## 2023-03-22 DIAGNOSIS — I2510 Atherosclerotic heart disease of native coronary artery without angina pectoris: Secondary | ICD-10-CM | POA: Diagnosis not present

## 2023-03-22 DIAGNOSIS — C50912 Malignant neoplasm of unspecified site of left female breast: Secondary | ICD-10-CM

## 2023-03-22 DIAGNOSIS — C7989 Secondary malignant neoplasm of other specified sites: Secondary | ICD-10-CM

## 2023-03-22 LAB — CMP (CANCER CENTER ONLY)
ALT: 19 U/L (ref 0–44)
AST: 39 U/L (ref 15–41)
Albumin: 4.1 g/dL (ref 3.5–5.0)
Alkaline Phosphatase: 63 U/L (ref 38–126)
Anion gap: 11 (ref 5–15)
BUN: 20 mg/dL (ref 8–23)
CO2: 24 mmol/L (ref 22–32)
Calcium: 10.1 mg/dL (ref 8.9–10.3)
Chloride: 107 mmol/L (ref 98–111)
Creatinine: 0.73 mg/dL (ref 0.44–1.00)
GFR, Estimated: >60 mL/min (ref >60)
Glucose, Bld: 88 mg/dL (ref 70–99)
Potassium: 4.1 mmol/L (ref 3.5–5.1)
Sodium: 142 mmol/L (ref 135–145)
Total Bilirubin: 0.5 mg/dL (ref <1.2)
Total Protein: 6.6 g/dL (ref 6.5–8.1)

## 2023-03-22 LAB — CBC WITH DIFFERENTIAL (CANCER CENTER ONLY)
Abs Immature Granulocytes: 0.02 10*3/uL (ref 0.00–0.07)
Basophils Absolute: 0 10*3/uL (ref 0.0–0.1)
Basophils Relative: 1 %
Eosinophils Absolute: 0 10*3/uL (ref 0.0–0.5)
Eosinophils Relative: 1 %
HCT: 37.9 % (ref 36.0–46.0)
Hemoglobin: 12.9 g/dL (ref 12.0–15.0)
Immature Granulocytes: 0 %
Lymphocytes Relative: 38 %
Lymphs Abs: 1.9 10*3/uL (ref 0.7–4.0)
MCH: 35.3 pg — ABNORMAL HIGH (ref 26.0–34.0)
MCHC: 34 g/dL (ref 30.0–36.0)
MCV: 103.8 fL — ABNORMAL HIGH (ref 80.0–100.0)
Monocytes Absolute: 0.5 10*3/uL (ref 0.1–1.0)
Monocytes Relative: 11 %
Neutro Abs: 2.4 10*3/uL (ref 1.7–7.7)
Neutrophils Relative %: 49 %
Platelet Count: 174 10*3/uL (ref 150–400)
RBC: 3.65 MIL/uL — ABNORMAL LOW (ref 3.87–5.11)
RDW: 14 % (ref 11.5–15.5)
WBC Count: 5 10*3/uL (ref 4.0–10.5)
nRBC: 0 % (ref 0.0–0.2)
nRBC: 0 /100{WBCs}

## 2023-03-22 MED ORDER — ACETAMINOPHEN 325 MG PO TABS
650.0000 mg | ORAL_TABLET | Freq: Once | ORAL | Status: AC
  Administered 2023-03-22: 650 mg via ORAL
  Filled 2023-03-22: qty 2

## 2023-03-22 MED ORDER — PALONOSETRON HCL INJECTION 0.25 MG/5ML
0.2500 mg | Freq: Once | INTRAVENOUS | Status: AC
  Administered 2023-03-22: 0.25 mg via INTRAVENOUS
  Filled 2023-03-22: qty 5

## 2023-03-22 MED ORDER — SODIUM CHLORIDE 0.9% FLUSH
10.0000 mL | INTRAVENOUS | Status: DC | PRN
Start: 2023-03-22 — End: 2023-03-22
  Administered 2023-03-22: 10 mL

## 2023-03-22 MED ORDER — HEPARIN SOD (PORK) LOCK FLUSH 100 UNIT/ML IV SOLN
500.0000 [IU] | Freq: Once | INTRAVENOUS | Status: AC | PRN
  Administered 2023-03-22: 500 [IU]

## 2023-03-22 MED ORDER — DEXTROSE 5 % IV SOLN: Freq: Once | INTRAVENOUS | Status: AC

## 2023-03-22 MED ORDER — DEXAMETHASONE SODIUM PHOSPHATE 10 MG/ML IJ SOLN
10.0000 mg | Freq: Once | INTRAMUSCULAR | Status: AC
Start: 2023-03-22 — End: 2023-03-22
  Administered 2023-03-22: 10 mg via INTRAVENOUS
  Filled 2023-03-22: qty 1

## 2023-03-22 MED ORDER — DIPHENHYDRAMINE HCL 25 MG PO CAPS
25.0000 mg | ORAL_CAPSULE | Freq: Once | ORAL | Status: AC
  Administered 2023-03-22: 25 mg via ORAL
  Filled 2023-03-22: qty 1

## 2023-03-22 MED ORDER — FAM-TRASTUZUMAB DERUXTECAN-NXKI CHEMO 100 MG IV SOLR
4.0500 mg/kg | Freq: Once | INTRAVENOUS | Status: AC
  Administered 2023-03-22: 200 mg via INTRAVENOUS
  Filled 2023-03-22: qty 10

## 2023-03-22 NOTE — Telephone Encounter (Signed)
Patient has been scheduled for follow-up visit per 03/22/23 LOS.  Pt given an appt calendar with date and time.

## 2023-03-22 NOTE — Progress Notes (Signed)
[name]  [address] [place],  [place]  [address] [phone]  Clinic Day:  03/22/2023  Referring physician: [name], MD  ASSESSMENT & PLAN:   Assessment & Plan: Malignant neoplasm of upper-outer quadrant of left breast in female, estrogen receptor negative ([name]) Stage IIB (T2c N1 M0) HER2 receptor positive, ER/PR negative, left breast cancer diagnosed in June 2020.  She was treated with neoadjuvant Kadcyla/Perjeta followed by left mastectomy.  She had a complete response in breast and nodes. She had only received two doses of adjuvant trastuzumab before discontinuing in December 2020. She developed recurrent disease in 2022  Malignant neoplasm metastatic to liver [facility]) Numerous heterogeneously enhancing, internally necrotic lesions in the right hepatic lobe CT in December 2023, which was consistent with metastatic disease. Largest lesion measures up to 6.7 cm and there are at least 6 lesions.  Liver biopsy was consistent with metastatic breast cancer with negative estrogen and progesterone receptors and positive HER2 receptor. She was initially treated with trastuzumab with an excellent response with significant decrease in the size of her liver lesions and sustained response. She later had progression of this disease in the chest wall and was switched to Enhertu.  CT chest, abdomen, and pelvis in March revealed stable subtle liver lesions with at least 1 lesion is no longer visualized.   CT chest, abdomen and pelvis in October revealed a decrease in the hepatic lesions with no new lesions seen.  She continues Enhertu every 3 weeks without significant difficulty.  She continues to have tingling and weakness of the left hand and has seen orthopedics with a repeat EMG that was abnormal.  She states Dr. Dierdre Searles mentions surgery and she is scheduled to see him next month.  She will proceed with a 22nd cycle of Enhertu today.  I will plan to see her back in 3 weeks with a  CBC and comprehensive metabolic panel prior to 23rd cycle  Polyneuropathy She has had chronic left hand pain, initially thought to be related to lymphedema.  Her lymphedema was controlled.  She was referred to neurology for further evaluation.  EMG revealed polyneuropathy.  She has now seen orthopedics and had an MRI of the left brachial plexus that was unremarkable, but limited due to patient motion.  She had been on gabapentin 100 mg at bedtime, but discontinued that on her own. She saw Dr. Dierdre Searles In October and underwent repeat EMG which was more abnormal.  He states she spoke with him on the phone and he mentions surgery, but she still needs to meet with him.  History of colon cancer, stage II History of stage II colon cancer diagnosed in May 2007.  Preop CEA was 5.2. She underwent surgical resection and surgical pathology revealed a 5.9 x 3.8 x 1.2 cm moderate to well-differentiated adenocarcinoma penetrating the muscular wall. Forty lymph nodes negative. No vascular or lymphatic invasion. Multiple diverticula with microabscess formation and inflammation. Carcinoma present in at least 1 diverticulum. She received adjuvant chemotherapy with Xeloda for 9 months. Colonoscopy from August 2013 revealed 2 polyps, which were resected and chronic diverticulosis.  Malignant neoplasm metastatic to bone [facility]) Bone metastases diagnosed in December 2022, for which she was placed on zoledronic acid every 4 weeks. She is now receiving zoledronic acid every 6 weeks to correspond with her chemotherapy.  Her last zoledronic acid was on October 22, so she will be due for that again in 3 weeks.  Chest wall recurrence of breast cancer,  left [facility]) Left chest wall recurrence diagnosed in January 2023.  Biopsy revealed carcinoma consistent with her previous breast cancer with negative estrogen and progesterone receptors and positive HER2 receptor.  She was initially treated with docetaxel/trastuzumab and received 10 cycles  with a good response.  She then had recurrence in the left chest wall in September 2023, so was switched to Enhertu good response.    Most recent CT imaging in March revealed stable extensive sclerotic bone metastase with a stable compression of L2 and T6, as well as stable to improved liver metastasis.  She does not have evidence of recurrent chest wall lesions.  CT chest, abdomen and pelvis in October revealed a decrease in the hepatic metastasis and no new metastasis.  She will proceed with a 22nd cycle of Enhertu today.  I will see her back in 3 weeks with a CBC and comprehensive metabolic panel prior to day 23rd cycle.    The patient understands the plans discussed today and is in agreement with them.  She knows to contact our office if she develops concerns prior to her next appointment.   I provided 30 minutes of face-to-face time during this encounter and > 50% was spent counseling as documented under my assessment and plan.    Adah Perl, PA-C  [facility] [address] [place] [address] Dept: [phone] Dept Fax: [phone]   [name].     CHIEF COMPLAINT:  CC: Recurrent HER2 receptor positive breast cancer  Current Treatment: Kadcyla every 3 weeks/zoledronic acid every 6 weeks  HISTORY OF PRESENT ILLNESS:   Oncology History Overview Note  Cancer Staging Malignant neoplasm of upper-outer quadrant of left breast in female, estrogen receptor negative ([name]) Staging form: Breast, AJCC 8th Edition - Clinical stage from 10/11/2018: Stage IIB (cT2, cN1, cM0, G3, ER-, PR-, HER2+) - Signed by Malachy Mood, MD on 11/02/2018 - Clinical: No stage assigned - Unsigned    History of colon cancer, stage II  09/2005 Initial Diagnosis   stage II adenocarcinoma of the sigmoid colon diagnosed in May 2007   09/21/2005 Surgery   She underwent  surgical resection on 09/21/2005. Findings were a 5.9 x 3.8 x 1.2 cm moderate to well-differentiated adenocarcinoma penetrating the muscular wall. Forty lymph nodes negative. No vascular or lymphatic invasion. Multiple diverticula with microabscess formation and inflammation. Carcinoma present in at least 1 diverticulum. Preop CEA 5.2 with lab normal range 0 to 2.5. Preop CT scan with no obvious additional pathology.    2007 -  Chemotherapy   She received oral Xeloda chemotherapy as an adjuvant. She declined treatment on a clinical trial.    11/12/2011 Initial Diagnosis   [name], stage II   12/09/2011 Procedure   Followup colonoscopy done on 12/09/2011. She was found to have 2 polyps which were removed. Chronic diverticulosis.     Malignant neoplasm of upper-outer quadrant of left breast in female, estrogen receptor negative ([name])  09/26/2018 Mammogram   Mammogram/US of left breast 09/26/18 IMPRESSION:  1. 2.9cm irregular mass in the upper outer left breast corresponds to the palpable abnormality. This is highly suspicious for breast carcinoma.  2. Two adjacent abnormal left axillary  Lymph nodes suspicious for metastatic adenopathy. There is a third borderline abnormal left axillary LN with a cortex thickened to 4mm.  3. Benign right breast cyst. No evidence of  right breast malignancy.    10/11/2018 Cancer Staging   Staging form: Breast, AJCC 8th Edition - Clinical stage from 10/11/2018: Stage IIB (cT2, cN1, cM0, G3, ER-, PR-, HER2+) - Signed by Malachy Mood, MD on 11/02/2018   10/11/2018 Initial Biopsy   Diagnosis 10/11/18 1. Breast, left, needle core biopsy, upper outer left 2 o'clock - INVASIVE DUCTAL CARCINOMA. - DUCTAL CARCINOMA IN SITU. - LYMPHOVASCULAR INVASION IS IDENTIFIED. - SEE COMMENT. 2. Lymph node, needle/core biopsy, left axilla - INVASIVE DUCTAL CARCINOMA. - SEE COMMENT.   10/11/2018 Receptors her2   The tumor cells are POSITIVE for Her2 (3+). Estrogen Receptor: 0%,  NEGATIVE Progesterone Receptor: 0%, NEGATIVE Proliferation Marker Ki67: 70%   11/02/2018 Initial Diagnosis   Malignant neoplasm of upper-outer quadrant of left breast in female, estrogen receptor negative ([name])   11/15/2018 Breast MRI   MRI breast 11/15/18  IMPRESSION: 1. 3.3 centimeter mass in the LATERAL portion of the LEFT breast consistent with known malignancy. 2. There is significant non mass enhancement surrounding this mass and extending anteriorly into the nipple base, with largest diameter in the anterior to posterior axis, measuring 7.2 centimeters. 3. If the patient would consider breast conservation, additional MR guided core biopsies are recommended. Consider biopsy of the inferior and anterior extent of the non mass enhancement to document extent of disease. 4. Three enlarged LEFT axillary lymph nodes. 5. RIGHT breast is negative.   11/15/2018 PET scan   PET 11/15/18 IMPRESSION: Hypermetabolic left breast lesion compatible with known primary. Hypermetabolic left axillary lymph nodes are consistent with metastatic disease.   No evidence for additional hypermetabolic metastatic involvement in the neck, chest, abdomen, or pelvis.   11/17/2018 - 03/01/2019 Chemotherapy   Neo-adjuvant Kadcyla and perjeta q3weeks for 6 cycles starting 11/17/18. Stopped before surgery    11/29/2018 Pathology Results   Diagnosis 1. Breast, left, needle core biopsy, inferior anterior (cylinder clip) - INVASIVE DUCTAL CARCINOMA. - DUCTAL CARCINOMA IN SITU. - LYMPHOVASCULAR INVASION IS IDENTIFIED. - SEE COMMENT. 2. Breast, left, needle core biopsy, central posterior (barbell clip) - INVASIVE DUCTAL CARCINOMA. - LYMPHOVASCULAR INVASION IS IDENTIFIED.   02/12/2019 Breast MRI   IMPRESSION: 1. Complete resolution of previously identified enhancing mass and associated non mass enhancement within the left breast. This is consistent with excellent response to chemotherapy. No residual or suspicious  findings are identified. 2. No MRI evidence of malignancy on the right. 3. Previously identified left axillary lymphadenopathy not definitively seen on today's study. However, this may be due to decreased field-of-view compared to prior study.     03/14/2019 Cancer Staging   Staging form: Breast, AJCC 8th Edition - Pathologic stage from 03/14/2019: pT0, pN0, cM0, GX, ER: Unknown, PR: Unknown, HER2: Not Assessed - Signed by Malachy Mood, MD on 03/28/2019   03/14/2019 Surgery   LEFT MASTECTOMY WITH TARGETED LYMPH NODE DISSECTION and Left Axillary Sentinel Lymph  Node Biopsy by Dr Carolynne Edouard 03/14/19    03/14/2019 Pathology Results   FINAL MICROSCOPIC DIAGNOSIS:   A. LYMPH NODE, LEFT, SENTINEL, BIOPSY:  - There is no evidence of carcinoma in 1 of 1 lymph node (0/1).   B. BREAST, LEFT, MASTECTOMY:  - Benign breast parenchyma with treatment-related changes.  - There is no evidence of malignancy.  - See oncology table below.   C. LYMPH NODE, LEFT #1, SENTINEL, BIOPSY:  - There is no evidence of carcinoma in 1 of 1 lymph node (0/1).   D. LYMPH NODE, LEFT #2, SENTINEL, BIOPSY:  - There is no evidence  of carcinoma in 1 of 1 lymph node (0/1).     04/04/2019 - 04/25/2019 Chemotherapy   Maintenance Herceptin injections every 3 weeks starting 04/03/19 to complete 1 year of treatment that was started in 11/2018. Stopped after 2nd dose as she will not be repeating Echos.    06/03/2021 - 06/24/2021 Chemotherapy   Patient is on Treatment Plan : BREAST Trastuzumab q21d X 11 Cycles     06/25/2021 - 01/01/2022 Chemotherapy   Patient is on Treatment Plan : BREAST Docetaxel + Trastuzumab + Pertuzumab (THP) q21d     06/25/2021 - 01/02/2022 Chemotherapy   Patient is on Treatment Plan : BREAST Docetaxel + Trastuzumab + Pertuzumab (THP) q21d     09/14/2021 Imaging   CT chest/abdomen/pelvis  IMPRESSION:  1. Interval resolution of previously seen bulky left axillary and  subpectoral lymphadenopathy. No persistently  enlarged lymph nodes.  2. Multiple liver lesions are significantly diminished in size.  3. Widespread osseous metastatic disease, with an interval increase  in sclerosis of several previously lytic lesions.  4. Constellation of findings is consistent with treatment response  of metastatic disease  5. New, although age indeterminate pathologic wedge deformity of the  T6 vertebral body as well as an increased pathologic wedge deformity  of the L2 vertebral body.  6. Coronary artery disease.  Aortic Atherosclerosis (ICD10-I70.0).    01/26/2022 -  Chemotherapy   Patient is on Treatment Plan : BREAST METASTATIC Fam-Trastuzumab Deruxtecan-nxki (Enhertu) (5.4) q21d     Malignant neoplasm metastatic to liver ([name])  04/17/2021 Imaging   CT ABDOMEN AND PELVIS WITH CONTRAST: -New lytic lesion involving the L2 vertebral body (6:72, 2:26) with minimal cortical breakthrough superiorly, but with preservation of vertebral body height, concerning for metastatic disease.  -There are several indeterminate hepatic lesions as described above. In addition to the lesions described above, there is an additional subtle 1.5 cm hypodensity in the hepatic dome (2:7). Given clinical history and presence of a new L2 lesion, leading differential consideration is metastatic disease. Recommend further evaluation with MRI of the abdomen with and without contrast.   05/12/2021 Imaging   MRI ABDOMEN WITH AND WITHOUT CONTRAST: Numerous heterogeneously enhancing, internally necrotic lesions in the right hepatic lobe, highly concerning for metastatic disease. Largest lesion measures up to 6.7 cm in hepatic segment VI.   Enhancing osseous lesions in the L2 vertebral body, both iliac bones, and in the sacrum, highly concerning for osseous metastases.   05/15/2021 Initial Diagnosis   Liver metastases ([name])   05/27/2021 PET scan   1. Widespread recurrent/metastatic disease in this patient who is  status post bilateral mastectomy.  Left chest wall recurrence with  left axillary/subpectoral, supraclavicular nodal metastasis.  2. Hepatic, left adrenal, and widespread osseous metastasis.  3. Incidental findings, including: Right nephrolithiasis. Tiny  hiatal hernia.    05/27/2021 Imaging   MRI LUMBAR AND THORACIC SPINE: Thoracic spine:  Osseous metastatic disease involving each level. The most dramatic deposits are at T4 and T6 where there is extraosseous/epidural tumor. No cord compression but there is foraminal impingement on the left at T6-7 and on the right at T3-4. Early dorsal epidural tumor at the level of T10 and T3.   Lumbar spine:  1. Widespread osseous metastatic disease with largest deposit  replacing the L2 body where there is a compression fracture with  mild height loss. Also at this level is early epidural tumor  extension likely affecting both L2-3 foramina.  2. 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    05/27/2021 Imaging   MRI HEAD WITH AND WITHOUT CONTRAST: Negative for metastatic disease to the brain or calvarium.   06/03/2021 - 06/24/2021 Chemotherapy   Patient is on Treatment Plan : BREAST Trastuzumab q21d X 11 Cycles     06/25/2021 - 01/01/2022 Chemotherapy   Patient is on Treatment Plan : BREAST Docetaxel + Trastuzumab + Pertuzumab (THP) q21d     06/25/2021 - 01/02/2022 Chemotherapy   Patient is on Treatment Plan : BREAST Docetaxel + Trastuzumab + Pertuzumab (THP) q21d     09/14/2021 Imaging   CT chest/abdomen/pelvis  IMPRESSION:  1. Interval resolution of previously seen bulky left axillary and  subpectoral lymphadenopathy. No persistently enlarged lymph nodes.  2. Multiple liver lesions are significantly diminished in size.  3. Widespread osseous metastatic disease, with an interval increase  in sclerosis of several previously lytic lesions.  4. Constellation of findings is consistent with treatment response  of metastatic disease  5. New, although age  indeterminate pathologic wedge deformity of the  T6 vertebral body as well as an increased pathologic wedge deformity  of the L2 vertebral body.  6. Coronary artery disease.  Aortic Atherosclerosis (ICD10-I70.0).    01/26/2022 -  Chemotherapy   Patient is on Treatment Plan : BREAST METASTATIC Fam-Trastuzumab Deruxtecan-nxki (Enhertu) (5.4) q21d     Malignant neoplasm metastatic to bone ([name])  04/17/2021 Imaging   CT ABDOMEN AND PELVIS WITH CONTRAST: -New lytic lesion involving the L2 vertebral body (6:72, 2:26) with minimal cortical breakthrough superiorly, but with preservation of vertebral body height, concerning for metastatic disease.  -There are several indeterminate hepatic lesions as described above. In addition to the lesions described above, there is an additional subtle 1.5 cm hypodensity in the hepatic dome (2:7). Given clinical history and presence of a new L2 lesion, leading differential consideration is metastatic disease. Recommend further evaluation with MRI of the abdomen with and without contrast.   05/12/2021 Imaging   MRI ABDOMEN WITH AND WITHOUT CONTRAST: Numerous heterogeneously enhancing, internally necrotic lesions in the right hepatic lobe, highly concerning for metastatic disease. Largest lesion measures up to 6.7 cm in hepatic segment VI.   Enhancing osseous lesions in the L2 vertebral body, both iliac bones, and in the sacrum, highly concerning for osseous metastases.   05/15/2021 Initial Diagnosis   Bone metastases ([name])   05/27/2021 PET scan   1. Widespread recurrent/metastatic disease in this patient who is  status post bilateral mastectomy. Left chest wall recurrence with  left axillary/subpectoral, supraclavicular nodal metastasis.  2. Hepatic, left adrenal, and widespread osseous metastasis.  3. Incidental findings, including: Right nephrolithiasis. Tiny  hiatal hernia.    05/27/2021 Imaging   MRI LUMBAR AND THORACIC SPINE: Thoracic spine:  Osseous  metastatic disease involving each level. The most dramatic deposits are at T4 and T6 where there is extraosseous/epidural tumor. No cord compression but there is foraminal impingement on the left at T6-7 and on the right at T3-4. Early dorsal epidural tumor at the level of T10 and T3.   Lumbar spine:  1. Widespread osseous metastatic disease with largest deposit  replacing the L2 body where there is a compression fracture with  mild height loss. Also at this level is early epidural tumor  extension likely affecting both L2-3 foramina.  2. 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    05/27/2021 Imaging   MRI HEAD WITH AND WITHOUT CONTRAST: Negative for metastatic disease to the brain or calvarium.   06/03/2021 -  06/24/2021 Chemotherapy   Patient is on Treatment Plan : BREAST Trastuzumab q21d X 11 Cycles     06/25/2021 - 01/01/2022 Chemotherapy   Patient is on Treatment Plan : BREAST Docetaxel + Trastuzumab + Pertuzumab (THP) q21d     06/25/2021 - 01/02/2022 Chemotherapy   Patient is on Treatment Plan : BREAST Docetaxel + Trastuzumab + Pertuzumab (THP) q21d     09/14/2021 Imaging   CT chest/abdomen/pelvis  IMPRESSION:  1. Interval resolution of previously seen bulky left axillary and  subpectoral lymphadenopathy. No persistently enlarged lymph nodes.  2. Multiple liver lesions are significantly diminished in size.  3. Widespread osseous metastatic disease, with an interval increase  in sclerosis of several previously lytic lesions.  4. Constellation of findings is consistent with treatment response  of metastatic disease  5. New, although age indeterminate pathologic wedge deformity of the  T6 vertebral body as well as an increased pathologic wedge deformity  of the L2 vertebral body.  6. Coronary artery disease.  Aortic Atherosclerosis (ICD10-I70.0).    01/26/2022 -  Chemotherapy   Patient is on Treatment Plan : BREAST METASTATIC Fam-Trastuzumab  Deruxtecan-nxki (Enhertu) (5.4) q21d         INTERVAL HISTORY:  Alana is here today for repeat clinical assessment prior to a 22nd of Kadcyla.  She continues to tolerate this without significant difficulty her main complaint is increased tearing.  She reports occasional constipation.  She reports persistent tingling and weakness of the left hand.  She denies fevers or chills. She denies pain. Her appetite is good. Her weight has increased 3 pounds over last 3 weeks .  She saw Dr.  Dierdre Searles in orthopedics at [facility] and repeat EMG was abnormal.  She spoke with Dr. Dierdre Searles on the telephone and he mentioned surgery, she is awaiting follow-up.  REVIEW OF SYSTEMS:  Review of Systems  Constitutional:  Negative for appetite change, chills, diaphoresis, fatigue, fever and unexpected weight change.  HENT:   Negative for lump/mass, mouth sores, sore throat and trouble swallowing.   Eyes:  Positive for eye problems (increased tearing with chemotherapy).  Respiratory:  Negative for cough and shortness of breath.   Cardiovascular:  Negative for chest pain, leg swelling and palpitations.  Gastrointestinal:  Positive for constipation. Negative for abdominal pain, blood in stool, diarrhea, nausea and vomiting.  Endocrine: Negative for hot flashes.  Genitourinary:  Negative for difficulty urinating, dysuria, frequency, hematuria, vaginal bleeding and vaginal discharge.   Musculoskeletal:  Negative for arthralgias, back pain and myalgias.  Skin:  Negative for rash.  Neurological:  Positive for extremity weakness (left hand) and numbness (tingling left hand). Negative for dizziness and headaches.  Hematological:  Negative for adenopathy. Does not bruise/bleed easily.  Psychiatric/Behavioral:  Negative for depression and sleep disturbance. The patient is not nervous/anxious.      VITALS:  Blood pressure 127/81, pulse 96, temperature 97.8 F (36.6 C), temperature source Oral, resp. rate 18, height 5' 0.25" (1.53 m),  weight 110 lb (49.9 kg), SpO2 96%.  Wt Readings from Last 3 Encounters:  03/22/23 110 lb (49.9 kg)  03/01/23 107 lb (48.5 kg)  02/24/23 109 lb 4.8 oz (49.6 kg)    Body mass index is 21.3 kg/m.  Performance status (ECOG): 2 - Symptomatic, <50% confined to bed  PHYSICAL EXAM:  Physical Exam Vitals and nursing note reviewed.  Constitutional:      General: She is not in acute distress.    Appearance: Normal appearance.  HENT:     Head:  Normocephalic and atraumatic.     Mouth/Throat:     Mouth: Mucous membranes are moist.     Pharynx: Oropharynx is clear. No oropharyngeal exudate or posterior oropharyngeal erythema.  Eyes:     General: No scleral icterus.    Extraocular Movements: Extraocular movements intact.     Conjunctiva/sclera: Conjunctivae normal.     Pupils: Pupils are equal, round, and reactive to light.  Cardiovascular:     Rate and Rhythm: Normal rate and regular rhythm.     Heart sounds: Normal heart sounds. No murmur heard.    No friction rub. No gallop.  Pulmonary:     Effort: Pulmonary effort is normal.     Breath sounds: Normal breath sounds. No wheezing, rhonchi or rales.  Chest:  Breasts:    Right: Normal. No inverted nipple, mass, nipple discharge or skin change.     Left: Absent.     Comments: Left mastectomy site is negative Abdominal:     General: There is no distension.     Palpations: Abdomen is soft. There is no hepatomegaly, splenomegaly or mass.     Tenderness: There is no abdominal tenderness.  Musculoskeletal:        General: Normal range of motion.     Right hand: Deformity present.     Left hand: Deformity present. Decreased strength.     Cervical back: Normal range of motion and neck supple. No tenderness.     Right lower leg: No edema.     Left lower leg: No edema.  Lymphadenopathy:     Cervical: No cervical adenopathy.     Upper Body:     Right upper body: No supraclavicular or axillary adenopathy.     Left upper body: No  supraclavicular or axillary adenopathy.     Lower Body: No right inguinal adenopathy. No left inguinal adenopathy.  Skin:    General: Skin is warm and dry.     Coloration: Skin is not jaundiced.     Findings: No rash.  Neurological:     Mental Status: She is alert and oriented to person, place, and time.     Cranial Nerves: No cranial nerve deficit.  Psychiatric:        Mood and Affect: Mood normal.        Behavior: Behavior normal.        Thought Content: Thought content normal.     LABS:      Latest Ref Rng & Units 03/22/2023    1:28 PM 02/24/2023   12:00 AM 02/17/2023   12:00 AM  CBC  WBC 4.0 - 10.5 K/uL 5.0  5.1     5.1      Hemoglobin 12.0 - 15.0 g/dL 57.8  46.9     62.9      Hematocrit 36.0 - 46.0 % 37.9  38     38      Platelets 150 - 400 K/uL 174  157     179         This result is from an external source.      Latest Ref Rng & Units 03/22/2023    1:28 PM 02/24/2023   12:00 AM 02/17/2023   12:00 AM  CMP  Glucose 70 - 99 mg/dL 88     BUN 8 - 23 mg/dL 20  19  C    15      Creatinine 0.44 - 1.00 mg/dL 5.28  0.7     0.7  Sodium 135 - 145 mmol/L 142  138     132      Potassium 3.5 - 5.1 mmol/L 4.1  4.2     4.5      Chloride 98 - 111 mmol/L 107  105     102      CO2 22 - 32 mmol/L 24  26     25       Calcium 8.9 - 10.3 mg/dL 16.1  09.6  C    9.3      Total Protein 6.5 - 8.1 g/dL 6.6     Total Bilirubin <1.2 mg/dL 0.5     Alkaline Phos 38 - 126 U/L 63  59     73      AST 15 - 41 U/L 39  46     47      ALT 0 - 44 U/L 19  21     20         C Corrected result   This result is from an external source.     Lab Results  Component Value Date   CEA1 1.8 06/03/2021   CEA 0.5 11/13/2012   /  CEA  Date Value Ref Range Status  06/03/2021 1.8 0.0 - 4.7 ng/mL Final    Comment:    (NOTE)                             Nonsmokers          <3.9                             Smokers             <5.6 [employer] Electrochemiluminescence  Immunoassay (ECLIA) Values obtained with different assay methods or kits cannot be used interchangeably.  Results cannot be interpreted as absolute evidence of the presence or absence of malignant disease. Performed At: [facility] [address] [place], [place] [id number] Jolene Schimke MD BJ:[id number]   11/13/2012 0.5 0.0 - 5.0 ng/mL Final   No results found for: "PSA1" No results found for: "CAN199" No results found for: "CAN125"  No results found for: "TOTALPROTELP", "ALBUMINELP", "A1GS", "A2GS", "BETS", "BETA2SER", "GAMS", "MSPIKE", "SPEI" No results found for: "TIBC", "FERRITIN", "IRONPCTSAT" Lab Results  Component Value Date   LDH 186 11/12/2011   LDH 187 09/08/2010   LDH 171 09/09/2009    STUDIES:  No results found.    HISTORY:   Past Medical History:  Diagnosis Date   Arthritis    shoulder   Breast cancer ([name])    Cancer ([name])    left breast ca   Colon cancer ([name])    [name], stage II 11/12/2011   Sigmoid lesion 5.9 cm  40 nodes negative but focus of cancer in a diverticum   Pre-op CEA 5.2 with lab normal up to 2.5 resected 09/21/05   Xeloda adjuvant chemotherapy   Hypertension    Polyneuropathy 02/03/2023    Past Surgical History:  Procedure Laterality Date   COLONOSCOPY     HEMICOLECTOMY  2007   MASTECTOMY WITH AXILLARY LYMPH NODE DISSECTION Left 03/14/2019   Procedure: LEFT MASTECTOMY WITH TARGETED LYMPH NODE DISSECTION;  Surgeon: Griselda Miner, MD;  Location: [facility] OR;  Service: General;  Laterality: Left;   SENTINEL NODE BIOPSY Left 03/14/2019   Procedure: Left Axillary Sentinel Lymph  Node Biopsy;  Surgeon:  Griselda Miner, MD;  Location: [facility] OR;  Service: General;  Laterality: Left;   TONSILLECTOMY     WISDOM TOOTH EXTRACTION      Family History  Problem Relation Age of Onset   Cancer Maternal Aunt        colon cancer     Social History:  reports that she has never smoked. She has never used smokeless tobacco. She reports  that she does not drink alcohol and does not use drugs.The patient is alone today.  Allergies: No Known Allergies  Current Medications: Current Outpatient Medications  Medication Sig Dispense Refill   Biotin 1 MG CAPS Take by mouth.     fish oil-omega-3 fatty acids 1000 MG capsule Take 1 g by mouth daily.     gabapentin (NEURONTIN) 100 MG capsule Take 1 capsule (100 mg total) by mouth at bedtime. (Patient not taking: Reported on 03/05/2022) 30 capsule 5   magnesium citrate SOLN Take 0.5 Bottles by mouth as needed for severe constipation.     Multiple Vitamin (MULTIVITAMIN) capsule Take 1 capsule by mouth daily.     ondansetron (ZOFRAN) 4 MG tablet Take 1 tablet (4 mg total) by mouth every 8 (eight) hours as needed for nausea or vomiting. (Patient not taking: Reported on 03/05/2022) 20 tablet 2   polyethylene glycol (MIRALAX / GLYCOLAX) 17 g packet Take 17 g by mouth as needed. constipation     No current facility-administered medications for this visit.

## 2023-03-22 NOTE — Patient Instructions (Signed)
Fam-Trastuzumab Deruxtecan Injection What is this medication? FAM-TRASTUZUMAB DERUXTECAN (fam-tras TOOZ eu mab DER ux TEE kan) treats some types of cancer. It works by blocking a protein that causes cancer cells to grow and multiply. This helps to slow or stop the spread of cancer cells. This medicine may be used for other purposes; ask your health care provider or pharmacist if you have questions. COMMON BRAND NAME(S): ENHERTU What should I tell my care team before I take this medication? They need to know if you have any of these conditions: Heart disease Heart failure Infection, especially a viral infection, such as chickenpox, cold sores, or herpes Liver disease Lung or breathing disease, such as asthma or COPD An unusual or allergic reaction to fam-trastuzumab deruxtecan, other medications, foods, dyes, or preservatives Pregnant or trying to get pregnant Breast-feeding How should I use this medication? This medication is injected into a vein. It is given by your care team in a hospital or clinic setting. A special MedGuide will be given to you before each treatment. Be sure to read this information carefully each time. Talk to your care team about the use of this medication in children. Special care may be needed. Overdosage: If you think you have taken too much of this medicine contact a poison control center or emergency room at once. NOTE: This medicine is only for you. Do not share this medicine with others. What if I miss a dose? It is important not to miss your dose. Call your care team if you are unable to keep an appointment. What may interact with this medication? Interactions are not expected. This list may not describe all possible interactions. Give your health care provider a list of all the medicines, herbs, non-prescription drugs, or dietary supplements you use. Also tell them if you smoke, drink alcohol, or use illegal drugs. Some items may interact with your  medicine. What should I watch for while using this medication? Visit your care team for regular checks on your progress. Tell your care team if your symptoms do not start to get better or if they get worse. Your condition will be monitored carefully while you are receiving this medication. Do not become pregnant while taking this medication or for 7 months after stopping it. Women should inform their care team if they wish to become pregnant or think they might be pregnant. Men should not father a child while taking this medication and for 4 months after stopping it. There is potential for serious side effects to an unborn child. Talk to your care team for more information. Do not breast-feed an infant while taking this medication or for 7 months after the last dose. This medication has caused decreased sperm counts in some men. This may make it more difficult to father a child. Talk to your care team if you are concerned about your fertility. This medication may increase your risk to bruise or bleed. Call your care team if you notice any unusual bleeding. Be careful brushing or flossing your teeth or using a toothpick because you may get an infection or bleed more easily. If you have any dental work done, tell your dentist you are receiving this medication. This medication may cause dry eyes and blurred vision. If you wear contact lenses, you may feel some discomfort. Lubricating eye drops may help. See your care team if the problem does not go away or is severe. This medication may increase your risk of getting an infection. Call your care team for   advice if you get a fever, chills, sore throat, or other symptoms of a cold or flu. Do not treat yourself. Try to avoid being around people who are sick. Avoid taking medications that contain aspirin, acetaminophen, ibuprofen, naproxen, or ketoprofen unless instructed by your care team. These medications may hide a fever. What side effects may I notice from  receiving this medication? Side effects that you should report to your care team as soon as possible: Allergic reactions--skin rash, itching, hives, swelling of the face, lips, tongue, or throat Dry cough, shortness of breath or trouble breathing Infection--fever, chills, cough, sore throat, wounds that don't heal, pain or trouble when passing urine, general feeling of discomfort or being unwell Heart failure--shortness of breath, swelling of the ankles, feet, or hands, sudden weight gain, unusual weakness or fatigue Unusual bruising or bleeding Side effects that usually do not require medical attention (report these to your care team if they continue or are bothersome): Constipation Diarrhea Hair loss Muscle pain Nausea Vomiting This list may not describe all possible side effects. Call your doctor for medical advice about side effects. You may report side effects to FDA at 1-800-FDA-1088. Where should I keep my medication? This medication is given in a hospital or clinic. It will not be stored at home. NOTE: This sheet is a summary. It may not cover all possible information. If you have questions about this medicine, talk to your doctor, pharmacist, or health care provider.  2024 [employer] (2021-02-10 00:00:00)  

## 2023-03-23 ENCOUNTER — Encounter: Payer: Self-pay | Admitting: Hematology and Oncology

## 2023-03-23 ENCOUNTER — Encounter: Payer: Self-pay | Admitting: Oncology

## 2023-03-23 NOTE — Assessment & Plan Note (Addendum)
Stage IIB (T2c N1 M0) HER2 receptor positive, ER/PR negative, left breast cancer diagnosed in June 2020.  She was treated with neoadjuvant Kadcyla/Perjeta followed by left mastectomy.  She had a complete response in breast and nodes. She had only received two doses of adjuvant trastuzumab before discontinuing in December 2020. She developed recurrent disease in 2022

## 2023-03-23 NOTE — Assessment & Plan Note (Signed)
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  Biopsy revealed carcinoma consistent with her previous breast cancer with negative estrogen and progesterone receptors and positive HER2 receptor.  She was initially treated with docetaxel/trastuzumab and received 10 cycles with a good response.  She then had recurrence in the left chest wall in September 2023, so was switched to Enhertu good response.    Most recent CT imaging in March revealed stable extensive sclerotic bone metastase with a stable compression of L2 and T6, as well as stable to improved liver metastasis.  She does not have evidence of recurrent chest wall lesions.  CT chest, abdomen and pelvis in October revealed a decrease in the hepatic metastasis and no new metastasis.  She will proceed with a 22nd cycle of Enhertu today.  I will see her back in 3 weeks with a CBC and comprehensive metabolic panel prior to day 23rd cycle.

## 2023-03-23 NOTE — Assessment & Plan Note (Signed)
History of stage II colon cancer diagnosed in May 2007.  Preop CEA was 5.2. She underwent surgical resection and surgical pathology revealed a 5.9 x 3.8 x 1.2 cm moderate to well-differentiated adenocarcinoma penetrating the muscular wall. Forty lymph nodes negative. No vascular or lymphatic invasion. Multiple diverticula with microabscess formation and inflammation. Carcinoma present in at least 1 diverticulum. She received adjuvant chemotherapy with Xeloda for 9 months. Colonoscopy from August 2013 revealed 2 polyps, which were resected and chronic diverticulosis.

## 2023-03-23 NOTE — Assessment & Plan Note (Signed)
She has had chronic left hand pain, initially thought to be related to lymphedema.  Her lymphedema was controlled.  She was referred to neurology for further evaluation.  EMG revealed polyneuropathy.  She has now seen orthopedics and had an MRI of the left brachial plexus that was unremarkable, but limited due to patient motion.  She had been on gabapentin 100 mg at bedtime, but discontinued that on her own. She saw Dr. Dierdre Searles In October and underwent repeat EMG which was more abnormal.  He states she spoke with him on the phone and he mentions surgery, but she still needs to meet with him.

## 2023-03-23 NOTE — Assessment & Plan Note (Signed)
Bone metastases diagnosed in December 2022, for which she was placed on zoledronic acid every 4 weeks. She is now receiving zoledronic acid every 6 weeks to correspond with her chemotherapy.  Her last zoledronic acid was on October 22, so she will be due for that again in 3 weeks.

## 2023-03-23 NOTE — Assessment & Plan Note (Signed)
Numerous heterogeneously enhancing, internally necrotic lesions in the right hepatic lobe CT in December 2023, which was consistent with metastatic disease. Largest lesion measures up to 6.7 cm and there are at least 6 lesions.  Liver biopsy was consistent with metastatic breast cancer with negative estrogen and progesterone receptors and positive HER2 receptor. She was initially treated with trastuzumab with an excellent response with significant decrease in the size of her liver lesions and sustained response. She later had progression of this disease in the chest wall and was switched to Enhertu.  CT chest, abdomen, and pelvis in March revealed stable subtle liver lesions with at least 1 lesion is no longer visualized.   CT chest, abdomen and pelvis in October revealed a decrease in the hepatic lesions with no new lesions seen.  She continues Enhertu every 3 weeks without significant difficulty.  She continues to have tingling and weakness of the left hand and has seen orthopedics with a repeat EMG that was abnormal.  She states Dr. Dierdre Searles mentions surgery and she is scheduled to see him next month.  She will proceed with a 22nd cycle of Enhertu today.  I will plan to see her back in 3 weeks with a CBC and comprehensive metabolic panel prior to 23rd cycle

## 2023-03-24 ENCOUNTER — Other Ambulatory Visit: Payer: Self-pay

## 2023-03-25 ENCOUNTER — Other Ambulatory Visit: Payer: Self-pay

## 2023-03-29 ENCOUNTER — Encounter: Payer: Self-pay | Admitting: Oncology

## 2023-03-31 ENCOUNTER — Encounter: Payer: Self-pay | Admitting: Oncology

## 2023-03-31 NOTE — Telephone Encounter (Signed)
Telephone call  

## 2023-04-06 ENCOUNTER — Other Ambulatory Visit: Payer: Self-pay | Admitting: Oncology

## 2023-04-06 DIAGNOSIS — C7951 Secondary malignant neoplasm of bone: Secondary | ICD-10-CM

## 2023-04-06 DIAGNOSIS — Z171 Estrogen receptor negative status [ER-]: Secondary | ICD-10-CM

## 2023-04-06 DIAGNOSIS — C787 Secondary malignant neoplasm of liver and intrahepatic bile duct: Secondary | ICD-10-CM

## 2023-04-11 ENCOUNTER — Encounter: Payer: Self-pay | Admitting: Oncology

## 2023-04-11 NOTE — Progress Notes (Unsigned)
[facility]  [address] [place],  [place]  [address] [phone]  Clinic Day:  04/12/2023  Referring physician: [name], MD  ASSESSMENT & PLAN:   Assessment & Plan: Malignant neoplasm metastatic to liver [facility]) Numerous heterogeneously enhancing, internally necrotic lesions in the right hepatic lobe CT in December 2023, which was consistent with metastatic disease. Largest lesion measures up to 6.7 cm and there are at least 6 lesions.  Liver biopsy was consistent with metastatic breast cancer with negative estrogen and progesterone receptors and positive HER2 receptor. She was initially treated with trastuzumab with an excellent response with significant decrease in the size of her liver lesions and sustained response. She later had progression of this disease in the chest wall and was switched to Enhertu.  CT chest, abdomen, and pelvis in March revealed stable subtle liver lesions with at least 1 lesion is no longer visualized.   CT chest, abdomen and pelvis in October revealed a decrease in the hepatic lesions with no new lesions seen.  She continues Enhertu every 3 weeks without significant difficulty.  She continues to have tingling and weakness of the left hand and has seen orthopedics with a repeat EMG that was abnormal.  She states Dr. Dierdre Searles mentions surgery and she is scheduled to see him next month.  She will proceed with a 23rd cycle of Enhertu today.  I will plan to see her back in 3 weeks with a CBC and comprehensive metabolic panel prior to 24th cycle.  Malignant neoplasm metastatic to bone [facility]) Bone metastases diagnosed in December 2022, for which she was placed on zoledronic acid every 4 weeks. She is now receiving zoledronic acid every 6 weeks to correspond with her chemotherapy.  She has borderline hypercalcemia.  CT imaging in October did not reveal any evidence of progressive disease.  Her last zoledronic acid was on October 22, so she will receive this  today.  Chest wall recurrence of breast cancer, left ([name]) Left chest wall recurrence diagnosed in January 2023.  Biopsy revealed carcinoma consistent with her previous breast cancer with negative estrogen and progesterone receptors and positive HER2 receptor.  She was initially treated with docetaxel/trastuzumab and received 10 cycles with a good response.  She then had recurrence in the left chest wall in September 2023, so was switched to Enhertu good response.    CT imaging in March revealed stable extensive sclerotic bone metastase with a stable compression of L2 and T6, as well as stable to improved liver metastasis.  She does not have evidence of recurrent chest wall lesions.  Most recent CT chest, abdomen and pelvis in October revealed a decrease in the hepatic metastasis and no new metastasis.  She will proceed with a 22nd cycle of Enhertu today.  I will see her back in 3 weeks with a CBC and comprehensive metabolic panel prior to day 23rd cycle.  Polyneuropathy She has had chronic left hand pain, initially thought to be related to lymphedema.  Her lymphedema was controlled. She had decreased strength and muscle wasting.  She was referred to neurology for further evaluation.  EMG revealed polyneuropathy.  She has now seen orthopedics and had an MRI of the left brachial plexus that was unremarkable, but limited due to patient motion.  She had been on gabapentin 100 mg at bedtime, but discontinued that on her own. She saw Dr. Dierdre Searles In October and underwent repeat EMG, which was more abnormal. She states he has referred her to  occupational therapy at this time.  Right shoulder pain She reports chronic right shoulder pain and decreased range of motion that improved previously with a steroid injection in the shoulder. It is now worsening. We will refer her back to the orthopedic surgeon who did her previous injection.    The patient understands the plans discussed today and is in agreement with them.   She knows to contact our office if she develops concerns prior to her next appointment.   I provided 40 minutes of face-to-face time during this encounter and > 50% was spent counseling as documented under my assessment and plan.    Adah Perl, PA-C  [facility] [place] - [facility] DEPT [facility] [address] [place] [address] Dept: [phone] Dept Fax: [phone]   [name].     CHIEF COMPLAINT:  CC: Recurrent HER2 receptor 2+ breast cancer  Current Treatment:  Enhertu every 3 weeks  HISTORY OF PRESENT ILLNESS:   Oncology History Overview Note  Cancer Staging Malignant neoplasm of upper-outer quadrant of left breast in female, estrogen receptor negative ([name]) Staging form: Breast, AJCC 8th Edition - Clinical stage from 10/11/2018: Stage IIB (cT2, cN1, cM0, G3, ER-, PR-, HER2+) - Signed by Malachy Mood, MD on 11/02/2018 - Clinical: No stage assigned - Unsigned    History of colon cancer, stage II  09/2005 Initial Diagnosis   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 Findings were a 5.9 x 3.8 x 1.2 cm moderate to well-differentiated adenocarcinoma penetrating the muscular wall. Forty lymph nodes negative. No vascular or lymphatic invasion. Multiple diverticula with microabscess formation and inflammation. Carcinoma present in at least 1 diverticulum. Preop CEA 5.2 with lab normal range 0 to 2.5. Preop CT scan with no obvious additional pathology.    2007 -  Chemotherapy   She received oral Xeloda chemotherapy as an adjuvant. She declined treatment on a clinical trial.    11/12/2011 Initial Diagnosis   [name], stage II   12/09/2011 Procedure   Followup colonoscopy done on 12/09/2011. She was found to have 2 polyps which were removed. Chronic diverticulosis.      Malignant neoplasm of upper-outer quadrant of left breast in female, estrogen receptor negative ([name])  09/26/2018 Mammogram   Mammogram/US of left breast 09/26/18 IMPRESSION:  1. 2.9cm irregular mass in the upper outer left breast corresponds to the palpable abnormality. This is highly suspicious for breast carcinoma.  2. Two adjacent abnormal left axillary  Lymph nodes suspicious for metastatic adenopathy. There is a third borderline abnormal left axillary LN with a cortex thickened to 4mm.  3. Benign right breast cyst. No evidence of right breast malignancy.    10/11/2018 Cancer Staging   Staging form: Breast, AJCC 8th Edition - Clinical stage from 10/11/2018: Stage IIB (cT2, cN1, cM0, G3, ER-, PR-, HER2+) - Signed by Malachy Mood, MD on 11/02/2018   10/11/2018 Initial Biopsy   Diagnosis 10/11/18 1. Breast, left, needle core biopsy, upper outer left 2 o'clock - INVASIVE DUCTAL CARCINOMA. - DUCTAL CARCINOMA IN SITU. - LYMPHOVASCULAR INVASION IS IDENTIFIED. - SEE COMMENT. 2. Lymph node, needle/core biopsy, left axilla - INVASIVE DUCTAL CARCINOMA. - SEE COMMENT.   10/11/2018 Receptors her2   The tumor cells are POSITIVE for Her2 (3+). Estrogen Receptor: 0%, NEGATIVE Progesterone Receptor: 0%, NEGATIVE  Proliferation Marker Ki67: 70%   11/02/2018 Initial Diagnosis   Malignant neoplasm of upper-outer quadrant of left breast in female, estrogen receptor negative ([name])   11/15/2018 Breast MRI   MRI breast 11/15/18  IMPRESSION: 1. 3.3 centimeter mass in the LATERAL portion of the LEFT breast consistent with known malignancy. 2. There is significant non mass enhancement surrounding this mass and extending anteriorly into the nipple base, with largest diameter in the anterior to posterior axis, measuring 7.2 centimeters. 3. If the patient would consider breast conservation, additional MR guided core biopsies are recommended. Consider biopsy of the inferior and anterior extent of the non mass  enhancement to document extent of disease. 4. Three enlarged LEFT axillary lymph nodes. 5. RIGHT breast is negative.   11/15/2018 PET scan   PET 11/15/18 IMPRESSION: Hypermetabolic left breast lesion compatible with known primary. Hypermetabolic left axillary lymph nodes are consistent with metastatic disease.   No evidence for additional hypermetabolic metastatic involvement in the neck, chest, abdomen, or pelvis.   11/17/2018 - 03/01/2019 Chemotherapy   Neo-adjuvant Kadcyla and perjeta q3weeks for 6 cycles starting 11/17/18. Stopped before surgery    11/29/2018 Pathology Results   Diagnosis 1. Breast, left, needle core biopsy, inferior anterior (cylinder clip) - INVASIVE DUCTAL CARCINOMA. - DUCTAL CARCINOMA IN SITU. - LYMPHOVASCULAR INVASION IS IDENTIFIED. - SEE COMMENT. 2. Breast, left, needle core biopsy, central posterior (barbell clip) - INVASIVE DUCTAL CARCINOMA. - LYMPHOVASCULAR INVASION IS IDENTIFIED.   02/12/2019 Breast MRI   IMPRESSION: 1. Complete resolution of previously identified enhancing mass and associated non mass enhancement within the left breast. This is consistent with excellent response to chemotherapy. No residual or suspicious findings are identified. 2. No MRI evidence of malignancy on the right. 3. Previously identified left axillary lymphadenopathy not definitively seen on today's study. However, this may be due to decreased field-of-view compared to prior study.     03/14/2019 Cancer Staging   Staging form: Breast, AJCC 8th Edition - Pathologic stage from 03/14/2019: pT0, pN0, cM0, GX, ER: Unknown, PR: Unknown, HER2: Not Assessed - Signed by Malachy Mood, MD on 03/28/2019   03/14/2019 Surgery   LEFT MASTECTOMY WITH TARGETED LYMPH NODE DISSECTION and Left Axillary Sentinel Lymph  Node Biopsy by Dr Carolynne Edouard 03/14/19    03/14/2019 Pathology Results   FINAL MICROSCOPIC DIAGNOSIS:   A. LYMPH NODE, LEFT, SENTINEL, BIOPSY:  - There is no evidence of carcinoma  in 1 of 1 lymph node (0/1).   B. BREAST, LEFT, MASTECTOMY:  - Benign breast parenchyma with treatment-related changes.  - There is no evidence of malignancy.  - See oncology table below.   C. LYMPH NODE, LEFT #1, SENTINEL, BIOPSY:  - There is no evidence of carcinoma in 1 of 1 lymph node (0/1).   D. LYMPH NODE, LEFT #2, SENTINEL, BIOPSY:  - There is no evidence of carcinoma in 1 of 1 lymph node (0/1).     04/04/2019 - 04/25/2019 Chemotherapy   Maintenance Herceptin injections every 3 weeks starting 04/03/19 to complete 1 year of treatment that was started in 11/2018. Stopped after 2nd dose as she will not be repeating Echos.    06/03/2021 - 06/24/2021 Chemotherapy   Patient is on Treatment Plan : BREAST Trastuzumab q21d X 11 Cycles     06/25/2021 - 01/01/2022 Chemotherapy   Patient is on Treatment Plan : BREAST Docetaxel + Trastuzumab + Pertuzumab (THP) q21d     06/25/2021 - 01/02/2022 Chemotherapy   Patient is on Treatment Plan : BREAST Docetaxel +  Trastuzumab + Pertuzumab (THP) q21d     09/14/2021 Imaging   CT chest/abdomen/pelvis  IMPRESSION:  1. Interval resolution of previously seen bulky left axillary and  subpectoral lymphadenopathy. No persistently enlarged lymph nodes.  2. Multiple liver lesions are significantly diminished in size.  3. Widespread osseous metastatic disease, with an interval increase  in sclerosis of several previously lytic lesions.  4. Constellation of findings is consistent with treatment response  of metastatic disease  5. New, although age indeterminate pathologic wedge deformity of the  T6 vertebral body as well as an increased pathologic wedge deformity  of the L2 vertebral body.  6. Coronary artery disease.  Aortic Atherosclerosis (ICD10-I70.0).    01/26/2022 -  Chemotherapy   Patient is on Treatment Plan : BREAST METASTATIC Fam-Trastuzumab Deruxtecan-nxki (Enhertu) (5.4) q21d     Malignant neoplasm metastatic to liver ([name])  04/17/2021 Imaging    CT ABDOMEN AND PELVIS WITH CONTRAST: -New lytic lesion involving the L2 vertebral body (6:72, 2:26) with minimal cortical breakthrough superiorly, but with preservation of vertebral body height, concerning for metastatic disease.  -There are several indeterminate hepatic lesions as described above. In addition to the lesions described above, there is an additional subtle 1.5 cm hypodensity in the hepatic dome (2:7). Given clinical history and presence of a new L2 lesion, leading differential consideration is metastatic disease. Recommend further evaluation with MRI of the abdomen with and without contrast.   05/12/2021 Imaging   MRI ABDOMEN WITH AND WITHOUT CONTRAST: Numerous heterogeneously enhancing, internally necrotic lesions in the right hepatic lobe, highly concerning for metastatic disease. Largest lesion measures up to 6.7 cm in hepatic segment VI.   Enhancing osseous lesions in the L2 vertebral body, both iliac bones, and in the sacrum, highly concerning for osseous metastases.   05/15/2021 Initial Diagnosis   Liver metastases ([name])   05/27/2021 PET scan   1. Widespread recurrent/metastatic disease in this patient who is  status post bilateral mastectomy. Left chest wall recurrence with  left axillary/subpectoral, supraclavicular nodal metastasis.  2. Hepatic, left adrenal, and widespread osseous metastasis.  3. Incidental findings, including: Right nephrolithiasis. Tiny  hiatal hernia.    05/27/2021 Imaging   MRI LUMBAR AND THORACIC SPINE: Thoracic spine:  Osseous metastatic disease involving each level. The most dramatic deposits are at T4 and T6 where there is extraosseous/epidural tumor. No cord compression but there is foraminal impingement on the left at T6-7 and on the right at T3-4. Early dorsal epidural tumor at the level of T10 and T3.   Lumbar spine:  1. Widespread osseous metastatic disease with largest deposit  replacing the L2 body where there is a compression fracture  with  mild height loss. Also at this level is early epidural tumor  extension likely affecting both L2-3 foramina.  2. 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    05/27/2021 Imaging   MRI HEAD WITH AND WITHOUT CONTRAST: Negative for metastatic disease to the brain or calvarium.   06/03/2021 - 06/24/2021 Chemotherapy   Patient is on Treatment Plan : BREAST Trastuzumab q21d X 11 Cycles     06/25/2021 - 01/01/2022 Chemotherapy   Patient is on Treatment Plan : BREAST Docetaxel + Trastuzumab + Pertuzumab (THP) q21d     06/25/2021 - 01/02/2022 Chemotherapy   Patient is on Treatment Plan : BREAST Docetaxel + Trastuzumab + Pertuzumab (THP) q21d     09/14/2021 Imaging   CT chest/abdomen/pelvis  IMPRESSION:  1. Interval resolution of previously seen bulky left axillary  and  subpectoral lymphadenopathy. No persistently enlarged lymph nodes.  2. Multiple liver lesions are significantly diminished in size.  3. Widespread osseous metastatic disease, with an interval increase  in sclerosis of several previously lytic lesions.  4. Constellation of findings is consistent with treatment response  of metastatic disease  5. New, although age indeterminate pathologic wedge deformity of the  T6 vertebral body as well as an increased pathologic wedge deformity  of the L2 vertebral body.  6. Coronary artery disease.  Aortic Atherosclerosis (ICD10-I70.0).    01/26/2022 -  Chemotherapy   Patient is on Treatment Plan : BREAST METASTATIC Fam-Trastuzumab Deruxtecan-nxki (Enhertu) (5.4) q21d     Malignant neoplasm metastatic to bone ([name])  04/17/2021 Imaging   CT ABDOMEN AND PELVIS WITH CONTRAST: -New lytic lesion involving the L2 vertebral body (6:72, 2:26) with minimal cortical breakthrough superiorly, but with preservation of vertebral body height, concerning for metastatic disease.  -There are several indeterminate hepatic lesions as described above. In addition to the lesions  described above, there is an additional subtle 1.5 cm hypodensity in the hepatic dome (2:7). Given clinical history and presence of a new L2 lesion, leading differential consideration is metastatic disease. Recommend further evaluation with MRI of the abdomen with and without contrast.   05/12/2021 Imaging   MRI ABDOMEN WITH AND WITHOUT CONTRAST: Numerous heterogeneously enhancing, internally necrotic lesions in the right hepatic lobe, highly concerning for metastatic disease. Largest lesion measures up to 6.7 cm in hepatic segment VI.   Enhancing osseous lesions in the L2 vertebral body, both iliac bones, and in the sacrum, highly concerning for osseous metastases.   05/15/2021 Initial Diagnosis   Bone metastases ([name])   05/27/2021 PET scan   1. Widespread recurrent/metastatic disease in this patient who is  status post bilateral mastectomy. Left chest wall recurrence with  left axillary/subpectoral, supraclavicular nodal metastasis.  2. Hepatic, left adrenal, and widespread osseous metastasis.  3. Incidental findings, including: Right nephrolithiasis. Tiny  hiatal hernia.    05/27/2021 Imaging   MRI LUMBAR AND THORACIC SPINE: Thoracic spine:  Osseous metastatic disease involving each level. The most dramatic deposits are at T4 and T6 where there is extraosseous/epidural tumor. No cord compression but there is foraminal impingement on the left at T6-7 and on the right at T3-4. Early dorsal epidural tumor at the level of T10 and T3.   Lumbar spine:  1. Widespread osseous metastatic disease with largest deposit  replacing the L2 body where there is a compression fracture with  mild height loss. Also at this level is early epidural tumor  extension likely affecting both L2-3 foramina.  2. 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    05/27/2021 Imaging   MRI HEAD WITH AND WITHOUT CONTRAST: Negative for metastatic disease to the brain or calvarium.   06/03/2021 -  06/24/2021 Chemotherapy   Patient is on Treatment Plan : BREAST Trastuzumab q21d X 11 Cycles     06/25/2021 - 01/01/2022 Chemotherapy   Patient is on Treatment Plan : BREAST Docetaxel + Trastuzumab + Pertuzumab (THP) q21d     06/25/2021 - 01/02/2022 Chemotherapy   Patient is on Treatment Plan : BREAST Docetaxel + Trastuzumab + Pertuzumab (THP) q21d     09/14/2021 Imaging   CT chest/abdomen/pelvis  IMPRESSION:  1. Interval resolution of previously seen bulky left axillary and  subpectoral lymphadenopathy. No persistently enlarged lymph nodes.  2. Multiple liver lesions are significantly diminished in size.  3. Widespread osseous metastatic disease, with an  interval increase  in sclerosis of several previously lytic lesions.  4. Constellation of findings is consistent with treatment response  of metastatic disease  5. New, although age indeterminate pathologic wedge deformity of the  T6 vertebral body as well as an increased pathologic wedge deformity  of the L2 vertebral body.  6. Coronary artery disease.  Aortic Atherosclerosis (ICD10-I70.0).    01/26/2022 -  Chemotherapy   Patient is on Treatment Plan : BREAST METASTATIC Fam-Trastuzumab Deruxtecan-nxki (Enhertu) (5.4) q21d         INTERVAL HISTORY:  Shwetha is here today for repeat clinical assessment.  She is more frustrated about her left hand weakness, muscle wasting and pain.  She reports intermittent neuropathic pain of her left hand.  She saw Dr. Dierdre Searles in orthopedics at [facility] again.  He has referred her to occupational therapy.  She now reports right shoulder pain with decreased range of motion.  She has had this in the past and it previously improved with a steroid injection.  This was done at [facility] orthopedics practice.  She also reports popping of the joints of her right hand, which she attributes to overuse due to the weakness in the left hand.  Due to these issues, she has difficulty with her ADLs.  She denies any changes in her right  breast or left chest wall.  She continues to tolerate Enhertu without difficulty.  She has chronic constipation for which MiraLAX is effective.  She denies fevers or chills. Her appetite is good. Her weight has decreased 2 pounds over last 3 weeks .  REVIEW OF SYSTEMS:  Review of Systems  Constitutional:  Negative for appetite change, chills, diaphoresis, fatigue, fever and unexpected weight change.  HENT:   Negative for lump/mass, mouth sores and sore throat.   Respiratory:  Negative for cough and shortness of breath.   Cardiovascular:  Negative for chest pain and leg swelling.  Gastrointestinal:  Negative for abdominal pain, constipation, diarrhea, nausea and vomiting.  Genitourinary:  Negative for difficulty urinating, dysuria, frequency and hematuria.   Musculoskeletal:  Positive for arthralgias (bilateral hands, with neuropathy left hand) and gait problem (walks with cane). Negative for back pain, myalgias and neck pain.  Skin:  Negative for rash.  Neurological:  Positive for extremity weakness (left hand) and gait problem (walks with cane). Negative for dizziness, headaches, light-headedness and numbness.  Hematological:  Negative for adenopathy. Does not bruise/bleed easily.  Psychiatric/Behavioral:  Negative for depression and sleep disturbance. The patient is not nervous/anxious.      VITALS:  Blood pressure 131/61, pulse 89, temperature 97.7 F (36.5 C), temperature source Oral, resp. rate 18, height 5' 0.25" (1.53 m), weight 111 lb 8 oz (50.6 kg), SpO2 93%.  Wt Readings from Last 3 Encounters:  04/12/23 111 lb 8 oz (50.6 kg)  04/12/23 113 lb (51.3 kg)  03/22/23 110 lb (49.9 kg)    Body mass index is 21.6 kg/m.  Performance status (ECOG): 2 - Symptomatic, <50% confined to bed  PHYSICAL EXAM:  Physical Exam Vitals and nursing note reviewed.  Constitutional:      General: She is not in acute distress.    Appearance: Normal appearance. She is ill-appearing (chronicall  ill-appearing).  HENT:     Head: Normocephalic and atraumatic.     Mouth/Throat:     Mouth: Mucous membranes are moist.     Pharynx: Oropharynx is clear. No oropharyngeal exudate or posterior oropharyngeal erythema.  Eyes:     General: No scleral icterus.  Extraocular Movements: Extraocular movements intact.     Conjunctiva/sclera: Conjunctivae normal.     Pupils: Pupils are equal, round, and reactive to light.  Cardiovascular:     Rate and Rhythm: Normal rate and regular rhythm.     Heart sounds: Normal heart sounds. No murmur heard.    No friction rub. No gallop.  Pulmonary:     Effort: Pulmonary effort is normal.     Breath sounds: Normal breath sounds. No wheezing, rhonchi or rales.  Chest:  Breasts:    Right: Normal. No inverted nipple, mass, nipple discharge or skin change.     Left: Absent.     Comments: Left chest wall site is negative Abdominal:     General: There is no distension.     Palpations: Abdomen is soft. There is no mass.     Tenderness: There is no abdominal tenderness.  Musculoskeletal:        General: Deformity (muscle wasting left hand, bilateral hand joint abnormality) present. Normal range of motion.     Cervical back: Normal range of motion and neck supple. No tenderness.     Right lower leg: No edema.     Left lower leg: No edema.  Lymphadenopathy:     Cervical: No cervical adenopathy.     Upper Body:     Right upper body: No supraclavicular or axillary adenopathy.     Left upper body: No supraclavicular or axillary adenopathy.  Skin:    General: Skin is warm and dry.     Coloration: Skin is not jaundiced.     Findings: No rash.  Neurological:     Mental Status: She is alert and oriented to person, place, and time.     Cranial Nerves: No cranial nerve deficit.  Psychiatric:        Mood and Affect: Mood normal.        Behavior: Behavior normal.        Thought Content: Thought content normal.     LABS:      Latest Ref Rng & Units  04/12/2023    1:00 PM 03/22/2023    1:28 PM 02/24/2023   12:00 AM  CBC  WBC 4.0 - 10.5 K/uL 5.6  5.0  5.1      Hemoglobin 12.0 - 15.0 g/dL 40.9  81.1  91.4      Hematocrit 36.0 - 46.0 % 39.0  37.9  38      Platelets 150 - 400 K/uL 192  174  157         This result is from an external source.      Latest Ref Rng & Units 04/12/2023    1:00 PM 03/22/2023    1:28 PM 02/24/2023   12:00 AM  CMP  Glucose 70 - 99 mg/dL 782  88    BUN 8 - 23 mg/dL 22  20  19   C     Creatinine 0.44 - 1.00 mg/dL 9.56  2.13  0.7      Sodium 135 - 145 mmol/L 141  142  138      Potassium 3.5 - 5.1 mmol/L 4.6  4.1  4.2      Chloride 98 - 111 mmol/L 106  107  105      CO2 22 - 32 mmol/L 25  24  26       Calcium 8.9 - 10.3 mg/dL 08.6  57.8  46.9  C     Total Protein 6.5 - 8.1 g/dL 6.8  6.6    Total Bilirubin <1.2 mg/dL 0.4  0.5    Alkaline Phos 38 - 126 U/L 68  63  59      AST 15 - 41 U/L 40  39  46      ALT 0 - 44 U/L 20  19  21         C Corrected result   This result is from an external source.     Lab Results  Component Value Date   CEA1 1.8 06/03/2021   CEA 0.5 11/13/2012   /  CEA  Date Value Ref Range Status  06/03/2021 1.8 0.0 - 4.7 ng/mL Final    Comment:    (NOTE)                             Nonsmokers          <3.9                             Smokers             <5.6 [employer] Electrochemiluminescence Immunoassay (ECLIA) Values obtained with different assay methods or kits cannot be used interchangeably.  Results cannot be interpreted as absolute evidence of the presence or absence of malignant disease. Performed At: [facility] [address] [place], [place] [id number] Jolene Schimke MD IO:[id number]   11/13/2012 0.5 0.0 - 5.0 ng/mL Final   No results found for: "PSA1" No results found for: "CAN199" No results found for: "CAN125"  No results found for: "TOTALPROTELP", "ALBUMINELP", "A1GS", "A2GS", "BETS", "BETA2SER", "GAMS", "MSPIKE", "SPEI" No results found  for: "TIBC", "FERRITIN", "IRONPCTSAT" Lab Results  Component Value Date   LDH 186 11/12/2011   LDH 187 09/08/2010   LDH 171 09/09/2009    STUDIES:  No results found.    HISTORY:   Past Medical History:  Diagnosis Date   Arthritis    shoulder   Breast cancer ([name])    Cancer ([name])    left breast ca   Colon cancer ([name])    [name], stage II 11/12/2011   Sigmoid lesion 5.9 cm  40 nodes negative but focus of cancer in a diverticum   Pre-op CEA 5.2 with lab normal up to 2.5 resected 09/21/05   Xeloda adjuvant chemotherapy   Hypertension    Polyneuropathy 02/03/2023   Right shoulder pain 04/12/2023    Past Surgical History:  Procedure Laterality Date   COLONOSCOPY     HEMICOLECTOMY  2007   MASTECTOMY WITH AXILLARY LYMPH NODE DISSECTION Left 03/14/2019   Procedure: LEFT MASTECTOMY WITH TARGETED LYMPH NODE DISSECTION;  Surgeon: Griselda Miner, MD;  Location: [facility] OR;  Service: General;  Laterality: Left;   SENTINEL NODE BIOPSY Left 03/14/2019   Procedure: Left Axillary Sentinel Lymph  Node Biopsy;  Surgeon: Griselda Miner, MD;  Location: [facility] OR;  Service: General;  Laterality: Left;   TONSILLECTOMY     WISDOM TOOTH EXTRACTION      Family History  Problem Relation Age of Onset   Cancer Maternal Aunt        colon cancer     Social History:  reports that she has never smoked. She has never used smokeless tobacco. She reports that she does not drink alcohol and does not use drugs.The patient is alone today.  Allergies: No Known Allergies  Current Medications: Current Outpatient Medications  Medication  Sig Dispense Refill   co-enzyme Q-10 30 MG capsule Take 100 mg by mouth daily.     Biotin 1 MG CAPS Take by mouth. (Patient not taking: Reported on 04/12/2023)     fish oil-omega-3 fatty acids 1000 MG capsule Take 1 g by mouth daily. (Patient not taking: Reported on 04/12/2023)     gabapentin (NEURONTIN) 100 MG capsule Take 1 capsule (100 mg total) by mouth at bedtime.  (Patient not taking: Reported on 03/05/2022) 30 capsule 5   magnesium citrate SOLN Take 0.5 Bottles by mouth as needed for severe constipation.     Multiple Vitamin (MULTIVITAMIN) capsule Take 1 capsule by mouth daily.     ondansetron (ZOFRAN) 4 MG tablet Take 1 tablet (4 mg total) by mouth every 8 (eight) hours as needed for nausea or vomiting. (Patient not taking: Reported on 03/05/2022) 20 tablet 2   polyethylene glycol (MIRALAX / GLYCOLAX) 17 g packet Take 17 g by mouth as needed. constipation     No current facility-administered medications for this visit.   Facility-Administered Medications Ordered in Other Visits  Medication Dose Route Frequency Provider Last Rate Last Admin   0.9 %  sodium chloride infusion   Intravenous Once [name], MD       sodium chloride flush (NS) 0.9 % injection 10 mL  10 mL Intracatheter PRN [name], MD   10 mL at 04/12/23 1604

## 2023-04-12 ENCOUNTER — Inpatient Hospital Stay (HOSPITAL_BASED_OUTPATIENT_CLINIC_OR_DEPARTMENT_OTHER): Payer: Medicare Other | Admitting: Hematology and Oncology

## 2023-04-12 ENCOUNTER — Inpatient Hospital Stay: Payer: Medicare Other

## 2023-04-12 ENCOUNTER — Inpatient Hospital Stay: Payer: Medicare Other | Attending: Oncology

## 2023-04-12 ENCOUNTER — Encounter: Payer: Self-pay | Admitting: Hematology and Oncology

## 2023-04-12 ENCOUNTER — Encounter: Payer: Self-pay | Admitting: Oncology

## 2023-04-12 VITALS — BP 131/61 | HR 89 | Temp 97.7°F | Resp 18 | Ht 60.25 in | Wt 111.5 lb

## 2023-04-12 DIAGNOSIS — M25511 Pain in right shoulder: Secondary | ICD-10-CM | POA: Insufficient documentation

## 2023-04-12 DIAGNOSIS — C787 Secondary malignant neoplasm of liver and intrahepatic bile duct: Secondary | ICD-10-CM

## 2023-04-12 DIAGNOSIS — I2510 Atherosclerotic heart disease of native coronary artery without angina pectoris: Secondary | ICD-10-CM | POA: Diagnosis not present

## 2023-04-12 DIAGNOSIS — C50912 Malignant neoplasm of unspecified site of left female breast: Secondary | ICD-10-CM

## 2023-04-12 DIAGNOSIS — Z9012 Acquired absence of left breast and nipple: Secondary | ICD-10-CM | POA: Diagnosis not present

## 2023-04-12 DIAGNOSIS — Z85038 Personal history of other malignant neoplasm of large intestine: Secondary | ICD-10-CM | POA: Diagnosis not present

## 2023-04-12 DIAGNOSIS — G8929 Other chronic pain: Secondary | ICD-10-CM

## 2023-04-12 DIAGNOSIS — G629 Polyneuropathy, unspecified: Secondary | ICD-10-CM | POA: Diagnosis not present

## 2023-04-12 DIAGNOSIS — C7951 Secondary malignant neoplasm of bone: Secondary | ICD-10-CM

## 2023-04-12 DIAGNOSIS — I972 Postmastectomy lymphedema syndrome: Secondary | ICD-10-CM | POA: Diagnosis not present

## 2023-04-12 DIAGNOSIS — C7989 Secondary malignant neoplasm of other specified sites: Secondary | ICD-10-CM

## 2023-04-12 DIAGNOSIS — C50412 Malignant neoplasm of upper-outer quadrant of left female breast: Secondary | ICD-10-CM | POA: Diagnosis present

## 2023-04-12 DIAGNOSIS — Z5112 Encounter for antineoplastic immunotherapy: Secondary | ICD-10-CM | POA: Insufficient documentation

## 2023-04-12 DIAGNOSIS — Z171 Estrogen receptor negative status [ER-]: Secondary | ICD-10-CM

## 2023-04-12 DIAGNOSIS — Z79899 Other long term (current) drug therapy: Secondary | ICD-10-CM | POA: Insufficient documentation

## 2023-04-12 DIAGNOSIS — K5909 Other constipation: Secondary | ICD-10-CM | POA: Diagnosis not present

## 2023-04-12 HISTORY — DX: Pain in right shoulder: M25.511

## 2023-04-12 LAB — CMP (CANCER CENTER ONLY)
ALT: 20 U/L (ref 0–44)
AST: 40 U/L (ref 15–41)
Albumin: 4 g/dL (ref 3.5–5.0)
Alkaline Phosphatase: 68 U/L (ref 38–126)
Anion gap: 10 (ref 5–15)
BUN: 22 mg/dL (ref 8–23)
CO2: 25 mmol/L (ref 22–32)
Calcium: 10.4 mg/dL — ABNORMAL HIGH (ref 8.9–10.3)
Chloride: 106 mmol/L (ref 98–111)
Creatinine: 0.84 mg/dL (ref 0.44–1.00)
GFR, Estimated: >60 mL/min (ref >60)
Glucose, Bld: 102 mg/dL — ABNORMAL HIGH (ref 70–99)
Potassium: 4.6 mmol/L (ref 3.5–5.1)
Sodium: 141 mmol/L (ref 135–145)
Total Bilirubin: 0.4 mg/dL (ref <1.2)
Total Protein: 6.8 g/dL (ref 6.5–8.1)

## 2023-04-12 LAB — CBC WITH DIFFERENTIAL (CANCER CENTER ONLY)
Abs Immature Granulocytes: 0.01 10*3/uL (ref 0.00–0.07)
Basophils Absolute: 0 10*3/uL (ref 0.0–0.1)
Basophils Relative: 1 %
Eosinophils Absolute: 0 10*3/uL (ref 0.0–0.5)
Eosinophils Relative: 1 %
HCT: 39 % (ref 36.0–46.0)
Hemoglobin: 13.3 g/dL (ref 12.0–15.0)
Immature Granulocytes: 0 %
Lymphocytes Relative: 36 %
Lymphs Abs: 2 10*3/uL (ref 0.7–4.0)
MCH: 35.4 pg — ABNORMAL HIGH (ref 26.0–34.0)
MCHC: 34.1 g/dL (ref 30.0–36.0)
MCV: 103.7 fL — ABNORMAL HIGH (ref 80.0–100.0)
Monocytes Absolute: 0.7 10*3/uL (ref 0.1–1.0)
Monocytes Relative: 13 %
Neutro Abs: 2.8 10*3/uL (ref 1.7–7.7)
Neutrophils Relative %: 49 %
Platelet Count: 192 10*3/uL (ref 150–400)
RBC: 3.76 MIL/uL — ABNORMAL LOW (ref 3.87–5.11)
RDW: 14.1 % (ref 11.5–15.5)
WBC Count: 5.6 10*3/uL (ref 4.0–10.5)
nRBC: 0 % (ref 0.0–0.2)
nRBC: 0 /100{WBCs}

## 2023-04-12 MED ORDER — FAM-TRASTUZUMAB DERUXTECAN-NXKI CHEMO 100 MG IV SOLR
4.0500 mg/kg | Freq: Once | INTRAVENOUS | Status: AC
  Administered 2023-04-12: 200 mg via INTRAVENOUS
  Filled 2023-04-12: qty 10

## 2023-04-12 MED ORDER — SODIUM CHLORIDE 0.9 % IV SOLN: Freq: Once | INTRAVENOUS | Status: DC

## 2023-04-12 MED ORDER — PALONOSETRON HCL INJECTION 0.25 MG/5ML
0.2500 mg | Freq: Once | INTRAVENOUS | Status: AC
  Administered 2023-04-12: 0.25 mg via INTRAVENOUS
  Filled 2023-04-12: qty 5

## 2023-04-12 MED ORDER — SODIUM CHLORIDE 0.9% FLUSH
10.0000 mL | INTRAVENOUS | Status: DC | PRN
  Administered 2023-04-12: 10 mL

## 2023-04-12 MED ORDER — ZOLEDRONIC ACID 4 MG/5ML IV CONC
3.0000 mg | Freq: Once | INTRAVENOUS | Status: AC
  Administered 2023-04-12: 3 mg via INTRAVENOUS
  Filled 2023-04-12: qty 3.75

## 2023-04-12 MED ORDER — HEPARIN SOD (PORK) LOCK FLUSH 100 UNIT/ML IV SOLN
500.0000 [IU] | Freq: Once | INTRAVENOUS | Status: AC | PRN
  Administered 2023-04-12: 500 [IU]

## 2023-04-12 MED ORDER — DEXTROSE 5 % IV SOLN
Freq: Once | INTRAVENOUS | Status: AC
Start: 2023-04-12 — End: 2023-04-12

## 2023-04-12 MED ORDER — ACETAMINOPHEN 325 MG PO TABS
650.0000 mg | ORAL_TABLET | Freq: Once | ORAL | Status: AC
  Administered 2023-04-12: 650 mg via ORAL
  Filled 2023-04-12: qty 2

## 2023-04-12 MED ORDER — DIPHENHYDRAMINE HCL 25 MG PO CAPS
25.0000 mg | ORAL_CAPSULE | Freq: Once | ORAL | Status: AC
  Administered 2023-04-12: 25 mg via ORAL
  Filled 2023-04-12: qty 1

## 2023-04-12 MED ORDER — DEXAMETHASONE SODIUM PHOSPHATE 10 MG/ML IJ SOLN
10.0000 mg | Freq: Once | INTRAMUSCULAR | Status: AC
  Administered 2023-04-12: 10 mg via INTRAVENOUS
  Filled 2023-04-12: qty 1

## 2023-04-12 NOTE — Progress Notes (Signed)
Patient expresses dissapointment with status of left hand and arm- states that she is being shuffled from provider to provider to address lymphadema and neuropathy- Also reports constant eye tearing and nose running. Patient allowed to verbalize feelings and reassurance provided.

## 2023-04-12 NOTE — Assessment & Plan Note (Addendum)
She has had chronic left hand pain, initially thought to be related to lymphedema.  Her lymphedema was controlled. She had decreased strength and muscle wasting.  She was referred to neurology for further evaluation.  EMG revealed polyneuropathy.  She has now seen orthopedics and had an MRI of the left brachial plexus that was unremarkable, but limited due to patient motion.  She had been on gabapentin 100 mg at bedtime, but discontinued that on her own. She saw Dr. Dierdre Searles In October and underwent repeat EMG, which was more abnormal. She states he has referred her to occupational therapy at this time.

## 2023-04-12 NOTE — Assessment & Plan Note (Addendum)
Numerous heterogeneously enhancing, internally necrotic lesions in the right hepatic lobe CT in December 2023, which was consistent with metastatic disease. Largest lesion measures up to 6.7 cm and there are at least 6 lesions.  Liver biopsy was consistent with metastatic breast cancer with negative estrogen and progesterone receptors and positive HER2 receptor. She was initially treated with trastuzumab with an excellent response with significant decrease in the size of her liver lesions and sustained response. She later had progression of this disease in the chest wall and was switched to Enhertu.  CT chest, abdomen, and pelvis in March revealed stable subtle liver lesions with at least 1 lesion is no longer visualized.   CT chest, abdomen and pelvis in October revealed a decrease in the hepatic lesions with no new lesions seen.  She continues Enhertu every 3 weeks without significant difficulty.  She will proceed with a 23rd cycle of Enhertu today.  I will plan to see her back in 3 weeks with a CBC and comprehensive metabolic panel prior to 24th cycle.

## 2023-04-12 NOTE — Patient Instructions (Signed)
Fam-Trastuzumab Deruxtecan Injection What is this medication? FAM-TRASTUZUMAB DERUXTECAN (fam-tras TOOZ eu mab DER ux TEE kan) treats some types of cancer. It works by blocking a protein that causes cancer cells to grow and multiply. This helps to slow or stop the spread of cancer cells. This medicine may be used for other purposes; ask your health care provider or pharmacist if you have questions. COMMON BRAND NAME(S): ENHERTU What should I tell my care team before I take this medication? They need to know if you have any of these conditions: Heart disease Heart failure Infection, especially a viral infection, such as chickenpox, cold sores, or herpes Liver disease Lung or breathing disease, such as asthma or COPD An unusual or allergic reaction to fam-trastuzumab deruxtecan, other medications, foods, dyes, or preservatives Pregnant or trying to get pregnant Breast-feeding How should I use this medication? This medication is injected into a vein. It is given by your care team in a hospital or clinic setting. A special MedGuide will be given to you before each treatment. Be sure to read this information carefully each time. Talk to your care team about the use of this medication in children. Special care may be needed. Overdosage: If you think you have taken too much of this medicine contact a poison control center or emergency room at once. NOTE: This medicine is only for you. Do not share this medicine with others. What if I miss a dose? It is important not to miss your dose. Call your care team if you are unable to keep an appointment. What may interact with this medication? Interactions are not expected. This list may not describe all possible interactions. Give your health care provider a list of all the medicines, herbs, non-prescription drugs, or dietary supplements you use. Also tell them if you smoke, drink alcohol, or use illegal drugs. Some items may interact with your  medicine. What should I watch for while using this medication? Visit your care team for regular checks on your progress. Tell your care team if your symptoms do not start to get better or if they get worse. Your condition will be monitored carefully while you are receiving this medication. Do not become pregnant while taking this medication or for 7 months after stopping it. Women should inform their care team if they wish to become pregnant or think they might be pregnant. Men should not father a child while taking this medication and for 4 months after stopping it. There is potential for serious side effects to an unborn child. Talk to your care team for more information. Do not breast-feed an infant while taking this medication or for 7 months after the last dose. This medication has caused decreased sperm counts in some men. This may make it more difficult to father a child. Talk to your care team if you are concerned about your fertility. This medication may increase your risk to bruise or bleed. Call your care team if you notice any unusual bleeding. Be careful brushing or flossing your teeth or using a toothpick because you may get an infection or bleed more easily. If you have any dental work done, tell your dentist you are receiving this medication. This medication may cause dry eyes and blurred vision. If you wear contact lenses, you may feel some discomfort. Lubricating eye drops may help. See your care team if the problem does not go away or is severe. This medication may increase your risk of getting an infection. Call your care team for   advice if you get a fever, chills, sore throat, or other symptoms of a cold or flu. Do not treat yourself. Try to avoid being around people who are sick. Avoid taking medications that contain aspirin, acetaminophen, ibuprofen, naproxen, or ketoprofen unless instructed by your care team. These medications may hide a fever. What side effects may I notice from  receiving this medication? Side effects that you should report to your care team as soon as possible: Allergic reactions--skin rash, itching, hives, swelling of the face, lips, tongue, or throat Dry cough, shortness of breath or trouble breathing Infection--fever, chills, cough, sore throat, wounds that don't heal, pain or trouble when passing urine, general feeling of discomfort or being unwell Heart failure--shortness of breath, swelling of the ankles, feet, or hands, sudden weight gain, unusual weakness or fatigue Unusual bruising or bleeding Side effects that usually do not require medical attention (report these to your care team if they continue or are bothersome): Constipation Diarrhea Hair loss Muscle pain Nausea Vomiting This list may not describe all possible side effects. Call your doctor for medical advice about side effects. You may report side effects to FDA at 1-800-FDA-1088. Where should I keep my medication? This medication is given in a hospital or clinic. It will not be stored at home. NOTE: This sheet is a summary. It may not cover all possible information. If you have questions about this medicine, talk to your doctor, pharmacist, or health care provider.  2024 [employer] (2021-02-10 00:00:00)  

## 2023-04-12 NOTE — Assessment & Plan Note (Signed)
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  Biopsy revealed carcinoma consistent with her previous breast cancer with negative estrogen and progesterone receptors and positive HER2 receptor.  She was initially treated with docetaxel/trastuzumab and received 10 cycles with a good response.  She then had recurrence in the left chest wall in September 2023, so was switched to Enhertu good response.    CT imaging in March revealed stable extensive sclerotic bone metastase with a stable compression of L2 and T6, as well as stable to improved liver metastasis.  She does not have evidence of recurrent chest wall lesions.  Most recent CT chest, abdomen and pelvis in October revealed a decrease in the hepatic metastasis and no new metastasis.  She will proceed with a 22nd cycle of Enhertu today.  I will see her back in 3 weeks with a CBC and comprehensive metabolic panel prior to day 23rd cycle.

## 2023-04-12 NOTE — Assessment & Plan Note (Addendum)
Bone metastases diagnosed in December 2022, for which she was placed on zoledronic acid every 4 weeks. She is now receiving zoledronic acid every 6 weeks to correspond with her chemotherapy.  She has borderline hypercalcemia.  CT imaging in October did not reveal any evidence of progressive disease.  Her last zoledronic acid was on October 22, so she will receive this today.

## 2023-04-12 NOTE — Assessment & Plan Note (Signed)
She reports chronic right shoulder pain and decreased range of motion that improved previously with a steroid injection in the shoulder. It is now worsening. We will refer her back to the orthopedic surgeon who did her previous injection.

## 2023-04-13 ENCOUNTER — Other Ambulatory Visit: Payer: Self-pay

## 2023-04-13 ENCOUNTER — Telehealth: Payer: Self-pay

## 2023-04-13 NOTE — Telephone Encounter (Signed)
-----   Message from Adah Perl sent at 04/12/2023  1:55 PM EST ----- Needs referral to ortho for right shoulder pain/decreased ROM, previously see at [facility] clinic near [facility], but she is not sure of anything else. I don't know if PCP office can help. Sorry, thanks

## 2023-04-13 NOTE — Telephone Encounter (Signed)
Referral sent to [facility] ortho in [place] phone #[phone] fax #[phone]. Patient aware they will call her to set up appt address given to patient [address] [place] [address]. Patient voiced understanding @ appt yesterday.

## 2023-04-13 NOTE — Addendum Note (Signed)
Addended by: Lianne Bushy on: 04/13/2023 11:23 AM   Modules accepted: Orders

## 2023-04-15 ENCOUNTER — Encounter: Payer: Self-pay | Admitting: Oncology

## 2023-05-02 ENCOUNTER — Inpatient Hospital Stay (HOSPITAL_BASED_OUTPATIENT_CLINIC_OR_DEPARTMENT_OTHER): Payer: Medicare Other | Admitting: Oncology

## 2023-05-02 ENCOUNTER — Inpatient Hospital Stay: Payer: Medicare Other

## 2023-05-02 ENCOUNTER — Encounter: Payer: Self-pay | Admitting: Oncology

## 2023-05-02 ENCOUNTER — Other Ambulatory Visit: Payer: Self-pay | Admitting: Pharmacist

## 2023-05-02 VITALS — BP 160/87 | HR 81 | Temp 97.7°F | Resp 16 | Ht 60.25 in | Wt 110.6 lb

## 2023-05-02 DIAGNOSIS — C7951 Secondary malignant neoplasm of bone: Secondary | ICD-10-CM

## 2023-05-02 DIAGNOSIS — C50412 Malignant neoplasm of upper-outer quadrant of left female breast: Secondary | ICD-10-CM

## 2023-05-02 DIAGNOSIS — Z5112 Encounter for antineoplastic immunotherapy: Secondary | ICD-10-CM | POA: Diagnosis not present

## 2023-05-02 DIAGNOSIS — Z171 Estrogen receptor negative status [ER-]: Secondary | ICD-10-CM

## 2023-05-02 DIAGNOSIS — C787 Secondary malignant neoplasm of liver and intrahepatic bile duct: Secondary | ICD-10-CM

## 2023-05-02 DIAGNOSIS — Z09 Encounter for follow-up examination after completed treatment for conditions other than malignant neoplasm: Secondary | ICD-10-CM | POA: Diagnosis not present

## 2023-05-02 DIAGNOSIS — T451X4S Poisoning by antineoplastic and immunosuppressive drugs, undetermined, sequela: Secondary | ICD-10-CM

## 2023-05-02 LAB — CMP (CANCER CENTER ONLY)
ALT: 20 U/L (ref 0–44)
AST: 39 U/L (ref 15–41)
Albumin: 4.3 g/dL (ref 3.5–5.0)
Alkaline Phosphatase: 68 U/L (ref 38–126)
Anion gap: 12 (ref 5–15)
BUN: 17 mg/dL (ref 8–23)
CO2: 25 mmol/L (ref 22–32)
Calcium: 10.7 mg/dL — ABNORMAL HIGH (ref 8.9–10.3)
Chloride: 103 mmol/L (ref 98–111)
Creatinine: 0.75 mg/dL (ref 0.44–1.00)
GFR, Estimated: >60 mL/min (ref >60)
Glucose, Bld: 89 mg/dL (ref 70–99)
Potassium: 4.4 mmol/L (ref 3.5–5.1)
Sodium: 140 mmol/L (ref 135–145)
Total Bilirubin: 0.6 mg/dL (ref <1.2)
Total Protein: 7.1 g/dL (ref 6.5–8.1)

## 2023-05-02 LAB — CBC WITH DIFFERENTIAL (CANCER CENTER ONLY)
Abs Immature Granulocytes: 0.01 10*3/uL (ref 0.00–0.07)
Basophils Absolute: 0 10*3/uL (ref 0.0–0.1)
Basophils Relative: 1 %
Eosinophils Absolute: 0 10*3/uL (ref 0.0–0.5)
Eosinophils Relative: 1 %
HCT: 38 % (ref 36.0–46.0)
Hemoglobin: 13.2 g/dL (ref 12.0–15.0)
Immature Granulocytes: 0 %
Lymphocytes Relative: 29 %
Lymphs Abs: 1.7 10*3/uL (ref 0.7–4.0)
MCH: 35.4 pg — ABNORMAL HIGH (ref 26.0–34.0)
MCHC: 34.7 g/dL (ref 30.0–36.0)
MCV: 101.9 fL — ABNORMAL HIGH (ref 80.0–100.0)
Monocytes Absolute: 0.7 10*3/uL (ref 0.1–1.0)
Monocytes Relative: 12 %
Neutro Abs: 3.4 10*3/uL (ref 1.7–7.7)
Neutrophils Relative %: 57 %
Platelet Count: 193 10*3/uL (ref 150–400)
RBC: 3.73 MIL/uL — ABNORMAL LOW (ref 3.87–5.11)
RDW: 13.9 % (ref 11.5–15.5)
WBC Count: 5.9 10*3/uL (ref 4.0–10.5)
nRBC: 0 % (ref 0.0–0.2)
nRBC: 0 /100{WBCs}

## 2023-05-02 MED ORDER — FAM-TRASTUZUMAB DERUXTECAN-NXKI CHEMO 100 MG IV SOLR
4.0500 mg/kg | Freq: Once | INTRAVENOUS | Status: AC
  Administered 2023-05-02: 200 mg via INTRAVENOUS
  Filled 2023-05-02: qty 10

## 2023-05-02 MED ORDER — HEPARIN SOD (PORK) LOCK FLUSH 100 UNIT/ML IV SOLN
500.0000 [IU] | Freq: Once | INTRAVENOUS | Status: AC | PRN
Start: 2023-05-02 — End: 2023-05-02
  Administered 2023-05-02: 500 [IU]

## 2023-05-02 MED ORDER — DEXAMETHASONE SODIUM PHOSPHATE 10 MG/ML IJ SOLN
10.0000 mg | Freq: Once | INTRAMUSCULAR | Status: AC
Start: 2023-05-02 — End: 2023-05-02
  Administered 2023-05-02: 10 mg via INTRAVENOUS
  Filled 2023-05-02: qty 1

## 2023-05-02 MED ORDER — SODIUM CHLORIDE 0.9% FLUSH
10.0000 mL | INTRAVENOUS | Status: DC | PRN
  Administered 2023-05-02: 10 mL

## 2023-05-02 MED ORDER — DIPHENHYDRAMINE HCL 25 MG PO CAPS
25.0000 mg | ORAL_CAPSULE | Freq: Once | ORAL | Status: AC
  Administered 2023-05-02: 25 mg via ORAL
  Filled 2023-05-02: qty 1

## 2023-05-02 MED ORDER — DEXTROSE 5 % IV SOLN: Freq: Once | INTRAVENOUS | Status: AC

## 2023-05-02 MED ORDER — ACETAMINOPHEN 325 MG PO TABS
650.0000 mg | ORAL_TABLET | Freq: Once | ORAL | Status: AC
  Administered 2023-05-02: 650 mg via ORAL
  Filled 2023-05-02: qty 2

## 2023-05-02 MED ORDER — PALONOSETRON HCL INJECTION 0.25 MG/5ML
0.2500 mg | Freq: Once | INTRAVENOUS | Status: AC
  Administered 2023-05-02: 0.25 mg via INTRAVENOUS
  Filled 2023-05-02: qty 5

## 2023-05-02 NOTE — Patient Instructions (Signed)
Fam-Trastuzumab Deruxtecan Injection What is this medication? FAM-TRASTUZUMAB DERUXTECAN (fam-tras TOOZ eu mab DER ux TEE kan) treats some types of cancer. It works by blocking a protein that causes cancer cells to grow and multiply. This helps to slow or stop the spread of cancer cells. This medicine may be used for other purposes; ask your health care provider or pharmacist if you have questions. COMMON BRAND NAME(S): ENHERTU What should I tell my care team before I take this medication? They need to know if you have any of these conditions: Heart disease Heart failure Infection, especially a viral infection, such as chickenpox, cold sores, or herpes Liver disease Lung or breathing disease, such as asthma or COPD An unusual or allergic reaction to fam-trastuzumab deruxtecan, other medications, foods, dyes, or preservatives Pregnant or trying to get pregnant Breast-feeding How should I use this medication? This medication is injected into a vein. It is given by your care team in a hospital or clinic setting. A special MedGuide will be given to you before each treatment. Be sure to read this information carefully each time. Talk to your care team about the use of this medication in children. Special care may be needed. Overdosage: If you think you have taken too much of this medicine contact a poison control center or emergency room at once. NOTE: This medicine is only for you. Do not share this medicine with others. What if I miss a dose? It is important not to miss your dose. Call your care team if you are unable to keep an appointment. What may interact with this medication? Interactions are not expected. This list may not describe all possible interactions. Give your health care provider a list of all the medicines, herbs, non-prescription drugs, or dietary supplements you use. Also tell them if you smoke, drink alcohol, or use illegal drugs. Some items may interact with your  medicine. What should I watch for while using this medication? Visit your care team for regular checks on your progress. Tell your care team if your symptoms do not start to get better or if they get worse. Your condition will be monitored carefully while you are receiving this medication. Do not become pregnant while taking this medication or for 7 months after stopping it. Women should inform their care team if they wish to become pregnant or think they might be pregnant. Men should not father a child while taking this medication and for 4 months after stopping it. There is potential for serious side effects to an unborn child. Talk to your care team for more information. Do not breast-feed an infant while taking this medication or for 7 months after the last dose. This medication has caused decreased sperm counts in some men. This may make it more difficult to father a child. Talk to your care team if you are concerned about your fertility. This medication may increase your risk to bruise or bleed. Call your care team if you notice any unusual bleeding. Be careful brushing or flossing your teeth or using a toothpick because you may get an infection or bleed more easily. If you have any dental work done, tell your dentist you are receiving this medication. This medication may cause dry eyes and blurred vision. If you wear contact lenses, you may feel some discomfort. Lubricating eye drops may help. See your care team if the problem does not go away or is severe. This medication may increase your risk of getting an infection. Call your care team for   advice if you get a fever, chills, sore throat, or other symptoms of a cold or flu. Do not treat yourself. Try to avoid being around people who are sick. Avoid taking medications that contain aspirin, acetaminophen, ibuprofen, naproxen, or ketoprofen unless instructed by your care team. These medications may hide a fever. What side effects may I notice from  receiving this medication? Side effects that you should report to your care team as soon as possible: Allergic reactions--skin rash, itching, hives, swelling of the face, lips, tongue, or throat Dry cough, shortness of breath or trouble breathing Infection--fever, chills, cough, sore throat, wounds that don't heal, pain or trouble when passing urine, general feeling of discomfort or being unwell Heart failure--shortness of breath, swelling of the ankles, feet, or hands, sudden weight gain, unusual weakness or fatigue Unusual bruising or bleeding Side effects that usually do not require medical attention (report these to your care team if they continue or are bothersome): Constipation Diarrhea Hair loss Muscle pain Nausea Vomiting This list may not describe all possible side effects. Call your doctor for medical advice about side effects. You may report side effects to FDA at 1-800-FDA-1088. Where should I keep my medication? This medication is given in a hospital or clinic. It will not be stored at home. NOTE: This sheet is a summary. It may not cover all possible information. If you have questions about this medicine, talk to your doctor, pharmacist, or health care provider.  2024 [employer] (2021-02-10 00:00:00)  

## 2023-05-02 NOTE — Progress Notes (Unsigned)
[facility] Cancer Initial Visit:  Patient Care Team: [name], MD as PCP - General (Family Medicine) [name], MD as Consulting Physician (Oncology)  CHIEF COMPLAINTS/PURPOSE OF CONSULTATION:  Oncology History Overview Note  Cancer Staging Malignant neoplasm of upper-outer quadrant of left breast in female, estrogen receptor negative ([name]) Staging form: Breast, AJCC 8th Edition - Clinical stage from 10/11/2018: Stage IIB (cT2, cN1, cM0, G3, ER-, PR-, HER2+) - Signed by Malachy Mood, MD on 11/02/2018 - Clinical: No stage assigned - Unsigned    History of colon cancer, stage II  09/2005 Initial Diagnosis   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 Findings were a 5.9 x 3.8 x 1.2 cm moderate to well-differentiated adenocarcinoma penetrating the muscular wall. Forty lymph nodes negative. No vascular or lymphatic invasion. Multiple diverticula with microabscess formation and inflammation. Carcinoma present in at least 1 diverticulum. Preop CEA 5.2 with lab normal range 0 to 2.5. Preop CT scan with no obvious additional pathology.    2007 -  Chemotherapy   She received oral Xeloda chemotherapy as an adjuvant. She declined treatment on a clinical trial.    11/12/2011 Initial Diagnosis   [name], stage II   12/09/2011 Procedure   Followup colonoscopy done on 12/09/2011. She was found to have 2 polyps which were removed. Chronic diverticulosis.     Malignant neoplasm of upper-outer quadrant of left breast in female, estrogen receptor negative ([name])  09/26/2018 Mammogram   Mammogram/US of left breast 09/26/18 IMPRESSION:  1. 2.9cm irregular mass in the upper outer left breast corresponds to the palpable abnormality. This is highly suspicious for breast carcinoma.  2. Two adjacent abnormal left axillary  Lymph nodes suspicious for metastatic adenopathy. There is a  third borderline abnormal left axillary LN with a cortex thickened to 4mm.  3. Benign right breast cyst. No evidence of right breast malignancy.    10/11/2018 Cancer Staging   Staging form: Breast, AJCC 8th Edition - Clinical stage from 10/11/2018: Stage IIB (cT2, cN1, cM0, G3, ER-, PR-, HER2+) - Signed by Malachy Mood, MD on 11/02/2018   10/11/2018 Initial Biopsy   Diagnosis 10/11/18 1. Breast, left, needle core biopsy, upper outer left 2 o'clock - INVASIVE DUCTAL CARCINOMA. - DUCTAL CARCINOMA IN SITU. - LYMPHOVASCULAR INVASION IS IDENTIFIED. - SEE COMMENT. 2. Lymph node, needle/core biopsy, left axilla - INVASIVE DUCTAL CARCINOMA. - SEE COMMENT.   10/11/2018 Receptors her2   The tumor cells are POSITIVE for Her2 (3+). Estrogen Receptor: 0%, NEGATIVE Progesterone Receptor: 0%, NEGATIVE Proliferation Marker Ki67: 70%   11/02/2018 Initial Diagnosis   Malignant neoplasm of upper-outer quadrant of left breast in female, estrogen receptor negative ([name])   11/15/2018 Breast MRI   MRI breast 11/15/18  IMPRESSION: 1. 3.3 centimeter mass in the LATERAL portion of the LEFT breast consistent with known malignancy. 2. There is significant non mass enhancement surrounding this mass and extending anteriorly into the nipple base, with largest diameter in the anterior to posterior axis, measuring 7.2 centimeters. 3. If the patient would consider breast conservation, additional MR guided core biopsies are recommended. Consider biopsy of the inferior and anterior extent of the non mass enhancement to document extent of disease. 4. Three enlarged LEFT axillary lymph nodes. 5. RIGHT breast is negative.   11/15/2018 PET scan   PET 11/15/18 IMPRESSION: Hypermetabolic left breast lesion compatible with known primary. Hypermetabolic left axillary lymph  nodes are consistent with metastatic disease.   No evidence for additional hypermetabolic metastatic involvement in the neck, chest, abdomen, or pelvis.    11/17/2018 - 03/01/2019 Chemotherapy   Neo-adjuvant Kadcyla and perjeta q3weeks for 6 cycles starting 11/17/18. Stopped before surgery    11/29/2018 Pathology Results   Diagnosis 1. Breast, left, needle core biopsy, inferior anterior (cylinder clip) - INVASIVE DUCTAL CARCINOMA. - DUCTAL CARCINOMA IN SITU. - LYMPHOVASCULAR INVASION IS IDENTIFIED. - SEE COMMENT. 2. Breast, left, needle core biopsy, central posterior (barbell clip) - INVASIVE DUCTAL CARCINOMA. - LYMPHOVASCULAR INVASION IS IDENTIFIED.   02/12/2019 Breast MRI   IMPRESSION: 1. Complete resolution of previously identified enhancing mass and associated non mass enhancement within the left breast. This is consistent with excellent response to chemotherapy. No residual or suspicious findings are identified. 2. No MRI evidence of malignancy on the right. 3. Previously identified left axillary lymphadenopathy not definitively seen on today's study. However, this may be due to decreased field-of-view compared to prior study.     03/14/2019 Cancer Staging   Staging form: Breast, AJCC 8th Edition - Pathologic stage from 03/14/2019: pT0, pN0, cM0, GX, ER: Unknown, PR: Unknown, HER2: Not Assessed - Signed by Malachy Mood, MD on 03/28/2019   03/14/2019 Surgery   LEFT MASTECTOMY WITH TARGETED LYMPH NODE DISSECTION and Left Axillary Sentinel Lymph  Node Biopsy by Dr Carolynne Edouard 03/14/19    03/14/2019 Pathology Results   FINAL MICROSCOPIC DIAGNOSIS:   A. LYMPH NODE, LEFT, SENTINEL, BIOPSY:  - There is no evidence of carcinoma in 1 of 1 lymph node (0/1).   B. BREAST, LEFT, MASTECTOMY:  - Benign breast parenchyma with treatment-related changes.  - There is no evidence of malignancy.  - See oncology table below.   C. LYMPH NODE, LEFT #1, SENTINEL, BIOPSY:  - There is no evidence of carcinoma in 1 of 1 lymph node (0/1).   D. LYMPH NODE, LEFT #2, SENTINEL, BIOPSY:  - There is no evidence of carcinoma in 1 of 1 lymph node (0/1).      04/04/2019 - 04/25/2019 Chemotherapy   Maintenance Herceptin injections every 3 weeks starting 04/03/19 to complete 1 year of treatment that was started in 11/2018. Stopped after 2nd dose as she will not be repeating Echos.    06/03/2021 - 06/24/2021 Chemotherapy   Patient is on Treatment Plan : BREAST Trastuzumab q21d X 11 Cycles     06/25/2021 - 01/01/2022 Chemotherapy   Patient is on Treatment Plan : BREAST Docetaxel + Trastuzumab + Pertuzumab (THP) q21d     06/25/2021 - 01/02/2022 Chemotherapy   Patient is on Treatment Plan : BREAST Docetaxel + Trastuzumab + Pertuzumab (THP) q21d     09/14/2021 Imaging   CT chest/abdomen/pelvis  IMPRESSION:  1. Interval resolution of previously seen bulky left axillary and  subpectoral lymphadenopathy. No persistently enlarged lymph nodes.  2. Multiple liver lesions are significantly diminished in size.  3. Widespread osseous metastatic disease, with an interval increase  in sclerosis of several previously lytic lesions.  4. Constellation of findings is consistent with treatment response  of metastatic disease  5. New, although age indeterminate pathologic wedge deformity of the  T6 vertebral body as well as an increased pathologic wedge deformity  of the L2 vertebral body.  6. Coronary artery disease.  Aortic Atherosclerosis (ICD10-I70.0).    01/26/2022 -  Chemotherapy   Patient is on Treatment Plan : BREAST METASTATIC Fam-Trastuzumab Deruxtecan-nxki (Enhertu) (5.4) q21d     Malignant neoplasm metastatic to liver ([name])  04/17/2021 Imaging   CT ABDOMEN AND PELVIS WITH CONTRAST: -New lytic lesion involving the L2 vertebral body (6:72, 2:26) with minimal cortical breakthrough superiorly, but with preservation of vertebral body height, concerning for metastatic disease.  -There are several indeterminate hepatic lesions as described above. In addition to the lesions described above, there is an additional subtle 1.5 cm hypodensity in the hepatic dome  (2:7). Given clinical history and presence of a new L2 lesion, leading differential consideration is metastatic disease. Recommend further evaluation with MRI of the abdomen with and without contrast.   05/12/2021 Imaging   MRI ABDOMEN WITH AND WITHOUT CONTRAST: Numerous heterogeneously enhancing, internally necrotic lesions in the right hepatic lobe, highly concerning for metastatic disease. Largest lesion measures up to 6.7 cm in hepatic segment VI.   Enhancing osseous lesions in the L2 vertebral body, both iliac bones, and in the sacrum, highly concerning for osseous metastases.   05/15/2021 Initial Diagnosis   Liver metastases ([name])   05/27/2021 PET scan   1. Widespread recurrent/metastatic disease in this patient who is  status post bilateral mastectomy. Left chest wall recurrence with  left axillary/subpectoral, supraclavicular nodal metastasis.  2. Hepatic, left adrenal, and widespread osseous metastasis.  3. Incidental findings, including: Right nephrolithiasis. Tiny  hiatal hernia.    05/27/2021 Imaging   MRI LUMBAR AND THORACIC SPINE: Thoracic spine:  Osseous metastatic disease involving each level. The most dramatic deposits are at T4 and T6 where there is extraosseous/epidural tumor. No cord compression but there is foraminal impingement on the left at T6-7 and on the right at T3-4. Early dorsal epidural tumor at the level of T10 and T3.   Lumbar spine:  1. Widespread osseous metastatic disease with largest deposit  replacing the L2 body where there is a compression fracture with  mild height loss. Also at this level is early epidural tumor  extension likely affecting both L2-3 foramina.  2. 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    05/27/2021 Imaging   MRI HEAD WITH AND WITHOUT CONTRAST: Negative for metastatic disease to the brain or calvarium.   06/03/2021 - 06/24/2021 Chemotherapy   Patient is on Treatment Plan : BREAST Trastuzumab q21d X 11  Cycles     06/25/2021 - 01/01/2022 Chemotherapy   Patient is on Treatment Plan : BREAST Docetaxel + Trastuzumab + Pertuzumab (THP) q21d     06/25/2021 - 01/02/2022 Chemotherapy   Patient is on Treatment Plan : BREAST Docetaxel + Trastuzumab + Pertuzumab (THP) q21d     09/14/2021 Imaging   CT chest/abdomen/pelvis  IMPRESSION:  1. Interval resolution of previously seen bulky left axillary and  subpectoral lymphadenopathy. No persistently enlarged lymph nodes.  2. Multiple liver lesions are significantly diminished in size.  3. Widespread osseous metastatic disease, with an interval increase  in sclerosis of several previously lytic lesions.  4. Constellation of findings is consistent with treatment response  of metastatic disease  5. New, although age indeterminate pathologic wedge deformity of the  T6 vertebral body as well as an increased pathologic wedge deformity  of the L2 vertebral body.  6. Coronary artery disease.  Aortic Atherosclerosis (ICD10-I70.0).    01/26/2022 -  Chemotherapy   Patient is on Treatment Plan : BREAST METASTATIC Fam-Trastuzumab Deruxtecan-nxki (Enhertu) (5.4) q21d     Malignant neoplasm metastatic to bone ([name])  04/17/2021 Imaging   CT ABDOMEN AND PELVIS WITH CONTRAST: -New lytic lesion involving the L2 vertebral body (6:72, 2:26) with minimal cortical breakthrough superiorly, but with  preservation of vertebral body height, concerning for metastatic disease.  -There are several indeterminate hepatic lesions as described above. In addition to the lesions described above, there is an additional subtle 1.5 cm hypodensity in the hepatic dome (2:7). Given clinical history and presence of a new L2 lesion, leading differential consideration is metastatic disease. Recommend further evaluation with MRI of the abdomen with and without contrast.   05/12/2021 Imaging   MRI ABDOMEN WITH AND WITHOUT CONTRAST: Numerous heterogeneously enhancing, internally necrotic lesions in the  right hepatic lobe, highly concerning for metastatic disease. Largest lesion measures up to 6.7 cm in hepatic segment VI.   Enhancing osseous lesions in the L2 vertebral body, both iliac bones, and in the sacrum, highly concerning for osseous metastases.   05/15/2021 Initial Diagnosis   Bone metastases ([name])   05/27/2021 PET scan   1. Widespread recurrent/metastatic disease in this patient who is  status post bilateral mastectomy. Left chest wall recurrence with  left axillary/subpectoral, supraclavicular nodal metastasis.  2. Hepatic, left adrenal, and widespread osseous metastasis.  3. Incidental findings, including: Right nephrolithiasis. Tiny  hiatal hernia.    05/27/2021 Imaging   MRI LUMBAR AND THORACIC SPINE: Thoracic spine:  Osseous metastatic disease involving each level. The most dramatic deposits are at T4 and T6 where there is extraosseous/epidural tumor. No cord compression but there is foraminal impingement on the left at T6-7 and on the right at T3-4. Early dorsal epidural tumor at the level of T10 and T3.   Lumbar spine:  1. Widespread osseous metastatic disease with largest deposit  replacing the L2 body where there is a compression fracture with  mild height loss. Also at this level is early epidural tumor  extension likely affecting both L2-3 foramina.  2. 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    05/27/2021 Imaging   MRI HEAD WITH AND WITHOUT CONTRAST: Negative for metastatic disease to the brain or calvarium.   06/03/2021 - 06/24/2021 Chemotherapy   Patient is on Treatment Plan : BREAST Trastuzumab q21d X 11 Cycles     06/25/2021 - 01/01/2022 Chemotherapy   Patient is on Treatment Plan : BREAST Docetaxel + Trastuzumab + Pertuzumab (THP) q21d     06/25/2021 - 01/02/2022 Chemotherapy   Patient is on Treatment Plan : BREAST Docetaxel + Trastuzumab + Pertuzumab (THP) q21d     09/14/2021 Imaging   CT chest/abdomen/pelvis  IMPRESSION:  1.  Interval resolution of previously seen bulky left axillary and  subpectoral lymphadenopathy. No persistently enlarged lymph nodes.  2. Multiple liver lesions are significantly diminished in size.  3. Widespread osseous metastatic disease, with an interval increase  in sclerosis of several previously lytic lesions.  4. Constellation of findings is consistent with treatment response  of metastatic disease  5. New, although age indeterminate pathologic wedge deformity of the  T6 vertebral body as well as an increased pathologic wedge deformity  of the L2 vertebral body.  6. Coronary artery disease.  Aortic Atherosclerosis (ICD10-I70.0).    01/26/2022 -  Chemotherapy   Patient is on Treatment Plan : BREAST METASTATIC Fam-Trastuzumab Deruxtecan-nxki (Enhertu) (5.4) q21d       HISTORY OF PRESENTING ILLNESS: MARCHELE ZHENG 89 y.o. female is here because of metastatic breast cancer  February 14 2022:  CT CAP-  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  No new or progressive disease. Widespread sclerotic osseous metastasis, similar. Decreased trace left pleural fluid. Resolved right pleural effusion.   April 12 2023:  Cycle 22  Kadcyla/Enhertu Zometa  May 02 2023:  Scheduled follow up for management of metastatic breast cancer.  Continues to have neuropathy particularly involving left hand which is painful and interferes with ADL's.  Has lost 1 lbs.    Review of Systems  Constitutional:  Positive for fatigue. Negative for appetite change, chills, fever and unexpected weight change.  HENT:   Positive for trouble swallowing. Negative for mouth sores, nosebleeds and sore throat.   Eyes:        Vision changes:  None  Respiratory:  Negative for chest tightness, cough, hemoptysis, shortness of breath and wheezing.        PND:  none Orthopnea:  none DOE:    Cardiovascular:  Negative for chest pain, leg swelling and palpitations.       PND:  none Orthopnea:  none  Gastrointestinal:   Negative for abdominal pain, constipation, diarrhea, nausea and vomiting.  Endocrine: Negative for hot flashes.       Cold intolerance:  none Heat intolerance:  none  Genitourinary:  Negative for bladder incontinence.   Musculoskeletal:  Positive for arthralgias and back pain. Negative for myalgias, neck pain and neck stiffness.  Skin:  Negative for itching, rash and wound.  Neurological:  Positive for extremity weakness and numbness. Negative for dizziness, headaches and light-headedness.       Ambulates with a cane.  No falls  Hematological:  Negative for adenopathy. Does not bruise/bleed easily.  Psychiatric/Behavioral:  Negative for sleep disturbance and suicidal ideas. The patient is not nervous/anxious.     MEDICAL HISTORY: Past Medical History:  Diagnosis Date  . Arthritis    shoulder  . Breast cancer ([name])   . Cancer ([name])    left breast ca  . Colon cancer ([name])   . [name], stage II 11/12/2011   Sigmoid lesion 5.9 cm  40 nodes negative but focus of cancer in a diverticum   Pre-op CEA 5.2 with lab normal up to 2.5 resected 09/21/05   Xeloda adjuvant chemotherapy  . Hypertension   . Polyneuropathy 02/03/2023  . Right shoulder pain 04/12/2023    SURGICAL HISTORY: Past Surgical History:  Procedure Laterality Date  . COLONOSCOPY    . HEMICOLECTOMY  2007  . MASTECTOMY WITH AXILLARY LYMPH NODE DISSECTION Left 03/14/2019   Procedure: LEFT MASTECTOMY WITH TARGETED LYMPH NODE DISSECTION;  Surgeon: Griselda Miner, MD;  Location: [facility] OR;  Service: General;  Laterality: Left;  . SENTINEL NODE BIOPSY Left 03/14/2019   Procedure: Left Axillary Sentinel Lymph  Node Biopsy;  Surgeon: Griselda Miner, MD;  Location[facility];  Service: General;  Laterality: Left;  . TONSILLECTOMY    . WISDOM TOOTH EXTRACTION      SOCIAL HISTORY: Social History   Socioeconomic History  . Marital status: Married    Spouse name: Not on file  . Number of children: 3  . Years of education: Not on  file  . Highest education level: Not on file  Occupational History  . Not on file  Tobacco Use  . Smoking status: Never  . Smokeless tobacco: Never  Vaping Use  . Vaping status: Never Used  Substance and Sexual Activity  . Alcohol use: Never  . Drug use: Never  . Sexual activity: Not on file  Other Topics Concern  . Not on file  Social History Narrative  . Not on file   Social Drivers of Health   Financial Resource Strain: Not on file  Food Insecurity: Not on file  Transportation Needs: No Transportation Needs (06/09/2022)   Received from [facility], [facility]   [facility] - Transportation   . Lack of Transportation (Medical): No   . Lack of Transportation (Non-Medical): No  Physical Activity: Not on file  Stress: Not on file  Social Connections: Not on file  Intimate Partner Violence: Not on file    FAMILY HISTORY Family History  Problem Relation Age of Onset  . Cancer Maternal Aunt        colon cancer     ALLERGIES:  has no known allergies.  MEDICATIONS:  Current Outpatient Medications  Medication Sig Dispense Refill  . Biotin 1 MG CAPS Take by mouth. (Patient not taking: Reported on 04/12/2023)    . co-enzyme Q-10 30 MG capsule Take 100 mg by mouth daily.    . fish oil-omega-3 fatty acids 1000 MG capsule Take 1 g by mouth daily. (Patient not taking: Reported on 04/12/2023)    . gabapentin (NEURONTIN) 100 MG capsule Take 1 capsule (100 mg total) by mouth at bedtime. (Patient not taking: Reported on 03/05/2022) 30 capsule 5  . magnesium citrate SOLN Take 0.5 Bottles by mouth as needed for severe constipation.    . Multiple Vitamin (MULTIVITAMIN) capsule Take 1 capsule by mouth daily.    . ondansetron (ZOFRAN) 4 MG tablet Take 1 tablet (4 mg total) by mouth every 8 (eight) hours as needed for nausea or vomiting. (Patient not taking: Reported on 03/05/2022) 20 tablet 2  . polyethylene glycol (MIRALAX / GLYCOLAX) 17 g packet Take 17 g by mouth as needed.  constipation     No current facility-administered medications for this visit.    PHYSICAL EXAMINATION:  ECOG PERFORMANCE STATUS: {CHL ONC ECOG [phone]}   There were no vitals filed for this visit.  There were no vitals filed for this visit.   Physical Exam Vitals and nursing note reviewed.  Constitutional:      Appearance: Normal appearance. She is not toxic-appearing or diaphoretic.     Comments: Here with son. Elderly.  Chronically ill appearing  HENT:     Head: Normocephalic and atraumatic.     Right Ear: External ear normal.     Left Ear: External ear normal.     Nose: Nose normal. No congestion or rhinorrhea.  Eyes:     General: No scleral icterus.    Extraocular Movements: Extraocular movements intact.     Conjunctiva/sclera: Conjunctivae normal.     Pupils: Pupils are equal, round, and reactive to light.  Cardiovascular:     Rate and Rhythm: Normal rate.     Heart sounds: No murmur heard.    No friction rub. No gallop.  Pulmonary:     Effort: Pulmonary effort is normal. No respiratory distress.     Breath sounds: Normal breath sounds. No wheezing or rales.  Abdominal:     General: Bowel sounds are normal.     Palpations: Abdomen is soft.     Tenderness: There is no abdominal tenderness. There is no guarding.  Musculoskeletal:        General: No swelling, tenderness or deformity.     Cervical back: Normal range of motion and neck supple. No rigidity or tenderness.     Right lower leg: No edema.     Left lower leg: No edema.     Comments: Ambulates with a cane.  Wearing a glove on left hand  Lymphadenopathy:     Head:     Right side of  head: No submental, submandibular, tonsillar, preauricular, posterior auricular or occipital adenopathy.     Left side of head: No submental, submandibular, tonsillar, preauricular, posterior auricular or occipital adenopathy.     Cervical: No cervical adenopathy.     Right cervical: No superficial, deep or posterior  cervical adenopathy.    Left cervical: No superficial, deep or posterior cervical adenopathy.     Upper Body:     Right upper body: No supraclavicular, axillary, pectoral or epitrochlear adenopathy.     Left upper body: No supraclavicular, axillary, pectoral or epitrochlear adenopathy.  Skin:    General: Skin is warm.     Coloration: Skin is not jaundiced.  Neurological:     General: No focal deficit present.     Mental Status: She is alert and oriented to person, place, and time.     Cranial Nerves: No cranial nerve deficit.     Gait: Gait abnormal.  Psychiatric:        Mood and Affect: Mood normal.        Behavior: Behavior normal.        Thought Content: Thought content normal.        Judgment: Judgment normal.    LABORATORY DATA: I have personally reviewed the data as listed:  Infusion on 04/12/2023  Component Date Value Ref Range Status  . WBC Count 04/12/2023 5.6  4.0 - 10.5 K/uL Final  . RBC 04/12/2023 3.76 (L)  3.87 - 5.11 MIL/uL Final  . Hemoglobin 04/12/2023 13.3  12.0 - 15.0 g/dL Final  . HCT 16/02/9603 39.0  36.0 - 46.0 % Final  . MCV 04/12/2023 103.7 (H)  80.0 - 100.0 fL Final  . MCH 04/12/2023 35.4 (H)  26.0 - 34.0 pg Final  . MCHC 04/12/2023 34.1  30.0 - 36.0 g/dL Final  . RDW 54/01/8118 14.1  11.5 - 15.5 % Final  . Platelet Count 04/12/2023 192  150 - 400 K/uL Final  . nRBC 04/12/2023 0.00  0.0 - 0.2 % Final  . Neutrophils Relative % 04/12/2023 49  % Final  . Neutro Abs 04/12/2023 2.8  1.7 - 7.7 K/uL Final  . Lymphocytes Relative 04/12/2023 36  % Final  . Lymphs Abs 04/12/2023 2.0  0.7 - 4.0 K/uL Final  . Monocytes Relative 04/12/2023 13  % Final  . Monocytes Absolute 04/12/2023 0.7  0.1 - 1.0 K/uL Final  . Eosinophils Relative 04/12/2023 1  % Final  . Eosinophils Absolute 04/12/2023 0.0  0.0 - 0.5 K/uL Final  . Basophils Relative 04/12/2023 1  % Final  . Basophils Absolute 04/12/2023 0.0  0.0 - 0.1 K/uL Final  . Immature Granulocytes 04/12/2023 0  % Final   . Abs Immature Granulocytes 04/12/2023 0.01  0.00 - 0.07 K/uL Final  . nRBC 04/12/2023 0  0 /100 WBC Final   Performed at [facility], [address], [place], [place], [address]  . Sodium 04/12/2023 141  135 - 145 mmol/L Final  . Potassium 04/12/2023 4.6  3.5 - 5.1 mmol/L Final  . Chloride 04/12/2023 106  98 - 111 mmol/L Final  . CO2 04/12/2023 25  22 - 32 mmol/L Final  . Glucose, Bld 04/12/2023 102 (H)  70 - 99 mg/dL Final   Glucose reference range applies only to samples taken after fasting for at least 8 hours.  . BUN 04/12/2023 22  8 - 23 mg/dL Final  . Creatinine 95/62/1308 0.84  0.44 - 1.00 mg/dL Final  . Calcium 65/78/4696  10.4 (H)  8.9 - 10.3 mg/dL Final  . Total Protein 04/12/2023 6.8  6.5 - 8.1 g/dL Final  . Albumin 64/40/3474 4.0  3.5 - 5.0 g/dL Final  . AST 25/95/6387 40  15 - 41 U/L Final  . ALT 04/12/2023 20  0 - 44 U/L Final  . Alkaline Phosphatase 04/12/2023 68  38 - 126 U/L Final  . Total Bilirubin 04/12/2023 0.4  <1.2 mg/dL Final  . GFR, Estimated 04/12/2023 >60  >60 mL/min Final   Comment: (NOTE) Calculated using the CKD-EPI Creatinine Equation (2021)   . Anion gap 04/12/2023 10  5 - 15 Final   Performed at [facility], [address], [place], [place], [address]    RADIOGRAPHIC STUDIES: I have personally reviewed the radiological images as listed and agree with the findings in the report  No results found.  ASSESSMENT/PLAN Cancer Staging  Malignant neoplasm of upper-outer quadrant of left breast in female, estrogen receptor negative ([name]) Staging form: Breast, AJCC 8th Edition - Clinical stage from 10/11/2018: Stage IIB (cT2, cN1, cM0, G3, ER-, PR-, HER2+) - Signed by Malachy Mood, MD on 11/02/2018 Stage prefix: Initial diagnosis Nuclear grade: G3 Histologic grading system: 3 grade system - Pathologic stage from 03/14/2019: pT0, pN0, cM0, GX, ER: Unknown, PR: Unknown, HER2: Not Assessed - Signed by Malachy Mood, MD on 03/28/2019 Stage prefix: Initial diagnosis Multigene prognostic tests performed: None Histologic grading system: 3 grade system    No problem-specific Assessment & Plan notes found for this encounter.    [name].     minutes was spent in patient care.  This included time spent preparing to see the patient (e.g., review of tests), obtaining and/or reviewing separately obtained history, counseling and educating the patient/family/caregiver, ordering medications, tests, or procedures; documenting clinical information in the electronic or other health record, independently interpreting results and communicating results to the patient/family/caregiver as well as coordination of care.       All questions were answered. The patient knows to call the clinic with any problems, questions or concerns.  This note was electronically signed.    Loni Muse, MD  05/02/2023 12:33 PM

## 2023-05-04 ENCOUNTER — Other Ambulatory Visit: Payer: Self-pay

## 2023-05-06 ENCOUNTER — Other Ambulatory Visit: Payer: Self-pay

## 2023-05-19 ENCOUNTER — Encounter: Payer: Self-pay | Admitting: Oncology

## 2023-05-19 DIAGNOSIS — Z09 Encounter for follow-up examination after completed treatment for conditions other than malignant neoplasm: Secondary | ICD-10-CM | POA: Insufficient documentation

## 2023-05-24 ENCOUNTER — Ambulatory Visit: Payer: Medicare Other | Attending: Oncology

## 2023-05-24 DIAGNOSIS — Z0189 Encounter for other specified special examinations: Secondary | ICD-10-CM | POA: Diagnosis not present

## 2023-05-24 DIAGNOSIS — T451X4S Poisoning by antineoplastic and immunosuppressive drugs, undetermined, sequela: Secondary | ICD-10-CM | POA: Insufficient documentation

## 2023-05-24 DIAGNOSIS — C7952 Secondary malignant neoplasm of bone marrow: Secondary | ICD-10-CM | POA: Diagnosis present

## 2023-05-24 DIAGNOSIS — C7951 Secondary malignant neoplasm of bone: Secondary | ICD-10-CM | POA: Insufficient documentation

## 2023-05-24 LAB — ECHOCARDIOGRAM COMPLETE
AR max vel: 1.94 cm2
AV Area VTI: 1.66 cm2
AV Area mean vel: 1.77 cm2
AV Mean grad: 4.5 mm[Hg]
AV Peak grad: 8.7 mm[Hg]
Ao pk vel: 1.48 m/s
Area-P 1/2: 3.88 cm2
P 1/2 time: 522 ms
S' Lateral: 2.4 cm

## 2023-05-25 ENCOUNTER — Inpatient Hospital Stay: Payer: Medicare Other | Attending: Oncology

## 2023-05-25 ENCOUNTER — Inpatient Hospital Stay: Payer: Medicare Other

## 2023-05-25 ENCOUNTER — Encounter: Payer: Self-pay | Admitting: Oncology

## 2023-05-25 ENCOUNTER — Inpatient Hospital Stay (HOSPITAL_BASED_OUTPATIENT_CLINIC_OR_DEPARTMENT_OTHER): Payer: Medicare Other | Admitting: Oncology

## 2023-05-25 VITALS — BP 149/78 | HR 80 | Temp 97.8°F | Resp 18 | Ht 60.25 in | Wt 111.7 lb

## 2023-05-25 DIAGNOSIS — M25511 Pain in right shoulder: Secondary | ICD-10-CM | POA: Diagnosis not present

## 2023-05-25 DIAGNOSIS — C787 Secondary malignant neoplasm of liver and intrahepatic bile duct: Secondary | ICD-10-CM | POA: Diagnosis not present

## 2023-05-25 DIAGNOSIS — Z85038 Personal history of other malignant neoplasm of large intestine: Secondary | ICD-10-CM | POA: Diagnosis not present

## 2023-05-25 DIAGNOSIS — C7951 Secondary malignant neoplasm of bone: Secondary | ICD-10-CM | POA: Insufficient documentation

## 2023-05-25 DIAGNOSIS — I972 Postmastectomy lymphedema syndrome: Secondary | ICD-10-CM | POA: Insufficient documentation

## 2023-05-25 DIAGNOSIS — G629 Polyneuropathy, unspecified: Secondary | ICD-10-CM | POA: Insufficient documentation

## 2023-05-25 DIAGNOSIS — Z9012 Acquired absence of left breast and nipple: Secondary | ICD-10-CM | POA: Diagnosis not present

## 2023-05-25 DIAGNOSIS — Z79899 Other long term (current) drug therapy: Secondary | ICD-10-CM | POA: Diagnosis not present

## 2023-05-25 DIAGNOSIS — Z171 Estrogen receptor negative status [ER-]: Secondary | ICD-10-CM

## 2023-05-25 DIAGNOSIS — C50412 Malignant neoplasm of upper-outer quadrant of left female breast: Secondary | ICD-10-CM | POA: Diagnosis present

## 2023-05-25 DIAGNOSIS — I2510 Atherosclerotic heart disease of native coronary artery without angina pectoris: Secondary | ICD-10-CM | POA: Insufficient documentation

## 2023-05-25 DIAGNOSIS — Z800 Family history of malignant neoplasm of digestive organs: Secondary | ICD-10-CM | POA: Insufficient documentation

## 2023-05-25 DIAGNOSIS — G8929 Other chronic pain: Secondary | ICD-10-CM | POA: Diagnosis not present

## 2023-05-25 DIAGNOSIS — Z5112 Encounter for antineoplastic immunotherapy: Secondary | ICD-10-CM | POA: Diagnosis present

## 2023-05-25 LAB — CBC WITH DIFFERENTIAL (CANCER CENTER ONLY)
Abs Immature Granulocytes: 0.01 10*3/uL (ref 0.00–0.07)
Basophils Absolute: 0 10*3/uL (ref 0.0–0.1)
Basophils Relative: 1 %
Eosinophils Absolute: 0.1 10*3/uL (ref 0.0–0.5)
Eosinophils Relative: 1 %
HCT: 37.2 % (ref 36.0–46.0)
Hemoglobin: 13.1 g/dL (ref 12.0–15.0)
Immature Granulocytes: 0 %
Lymphocytes Relative: 34 %
Lymphs Abs: 1.7 10*3/uL (ref 0.7–4.0)
MCH: 36.1 pg — ABNORMAL HIGH (ref 26.0–34.0)
MCHC: 35.2 g/dL (ref 30.0–36.0)
MCV: 102.5 fL — ABNORMAL HIGH (ref 80.0–100.0)
Monocytes Absolute: 0.6 10*3/uL (ref 0.1–1.0)
Monocytes Relative: 13 %
Neutro Abs: 2.7 10*3/uL (ref 1.7–7.7)
Neutrophils Relative %: 51 %
Platelet Count: 151 10*3/uL (ref 150–400)
RBC: 3.63 MIL/uL — ABNORMAL LOW (ref 3.87–5.11)
RDW: 13.9 % (ref 11.5–15.5)
WBC Count: 5.1 10*3/uL (ref 4.0–10.5)
nRBC: 0 % (ref 0.0–0.2)
nRBC: 0 /100{WBCs}

## 2023-05-25 LAB — CMP (CANCER CENTER ONLY)
ALT: 20 U/L (ref 0–44)
AST: 38 U/L (ref 15–41)
Albumin: 4.2 g/dL (ref 3.5–5.0)
Alkaline Phosphatase: 70 U/L (ref 38–126)
Anion gap: 9 (ref 5–15)
BUN: 23 mg/dL (ref 8–23)
CO2: 27 mmol/L (ref 22–32)
Calcium: 10.2 mg/dL (ref 8.9–10.3)
Chloride: 106 mmol/L (ref 98–111)
Creatinine: 0.8 mg/dL (ref 0.44–1.00)
GFR, Estimated: >60 mL/min (ref >60)
Glucose, Bld: 98 mg/dL (ref 70–99)
Potassium: 4.3 mmol/L (ref 3.5–5.1)
Sodium: 142 mmol/L (ref 135–145)
Total Bilirubin: 0.4 mg/dL (ref 0.0–1.2)
Total Protein: 6.9 g/dL (ref 6.5–8.1)

## 2023-05-25 MED ORDER — SODIUM CHLORIDE 0.9% FLUSH
10.0000 mL | INTRAVENOUS | Status: DC | PRN
  Administered 2023-05-25: 10 mL

## 2023-05-25 MED ORDER — DEXAMETHASONE SODIUM PHOSPHATE 10 MG/ML IJ SOLN
10.0000 mg | Freq: Once | INTRAMUSCULAR | Status: AC
  Administered 2023-05-25: 10 mg via INTRAVENOUS
  Filled 2023-05-25: qty 1

## 2023-05-25 MED ORDER — PALONOSETRON HCL INJECTION 0.25 MG/5ML
0.2500 mg | Freq: Once | INTRAVENOUS | Status: AC
  Administered 2023-05-25: 0.25 mg via INTRAVENOUS
  Filled 2023-05-25: qty 5

## 2023-05-25 MED ORDER — FAM-TRASTUZUMAB DERUXTECAN-NXKI CHEMO 100 MG IV SOLR
4.0500 mg/kg | Freq: Once | INTRAVENOUS | Status: AC
  Administered 2023-05-25: 200 mg via INTRAVENOUS
  Filled 2023-05-25: qty 10

## 2023-05-25 MED ORDER — DIPHENHYDRAMINE HCL 25 MG PO CAPS
25.0000 mg | ORAL_CAPSULE | Freq: Once | ORAL | Status: AC
  Administered 2023-05-25: 25 mg via ORAL
  Filled 2023-05-25: qty 1

## 2023-05-25 MED ORDER — HEPARIN SOD (PORK) LOCK FLUSH 100 UNIT/ML IV SOLN
500.0000 [IU] | Freq: Once | INTRAVENOUS | Status: AC | PRN
Start: 2023-05-25 — End: 2023-05-25
  Administered 2023-05-25: 500 [IU]

## 2023-05-25 MED ORDER — ACETAMINOPHEN 325 MG PO TABS
650.0000 mg | ORAL_TABLET | Freq: Once | ORAL | Status: AC
Start: 2023-05-25 — End: 2023-05-25
  Administered 2023-05-25: 650 mg via ORAL
  Filled 2023-05-25: qty 2

## 2023-05-25 MED ORDER — SODIUM CHLORIDE 0.9 % IV SOLN
3.0000 mg | Freq: Once | INTRAVENOUS | Status: AC
  Administered 2023-05-25: 3 mg via INTRAVENOUS
  Filled 2023-05-25: qty 3.75

## 2023-05-25 MED ORDER — DEXTROSE 5 % IV SOLN: Freq: Once | INTRAVENOUS | Status: AC

## 2023-05-25 NOTE — Patient Instructions (Signed)
Zoledronic Acid Injection (Cancer) What is this medication? ZOLEDRONIC ACID (ZOE le dron ik AS id) treats high calcium levels in the blood caused by cancer. It may also be used with chemotherapy to treat weakened bones caused by cancer. It works by slowing down the release of calcium from bones. This lowers calcium levels in your blood. It also makes your bones stronger and less likely to break (fracture). It belongs to a group of medications called bisphosphonates. This medicine may be used for other purposes; ask your health care provider or pharmacist if you have questions. COMMON BRAND NAME(S): Zometa, Zometa Powder What should I tell my care team before I take this medication? They need to know if you have any of these conditions: Dehydration Dental disease Kidney disease Liver disease Low levels of calcium in the blood Lung or breathing disease, such as asthma Receiving steroids, such as dexamethasone or prednisone An unusual or allergic reaction to zoledronic acid, other medications, foods, dyes, or preservatives Pregnant or trying to get pregnant Breast-feeding How should I use this medication? This medication is injected into a vein. It is given by your care team in a hospital or clinic setting. Talk to your care team about the use of this medication in children. Special care may be needed. Overdosage: If you think you have taken too much of this medicine contact a poison control center or emergency room at once. NOTE: This medicine is only for you. Do not share this medicine with others. What if I miss a dose? Keep appointments for follow-up doses. It is important not to miss your dose. Call your care team if you are unable to keep an appointment. What may interact with this medication? Certain antibiotics given by injection Diuretics, such as bumetanide, furosemide NSAIDs, medications for pain and inflammation, such as ibuprofen or naproxen Teriparatide Thalidomide This list  may not describe all possible interactions. Give your health care provider a list of all the medicines, herbs, non-prescription drugs, or dietary supplements you use. Also tell them if you smoke, drink alcohol, or use illegal drugs. Some items may interact with your medicine. What should I watch for while using this medication? Visit your care team for regular checks on your progress. It may be some time before you see the benefit from this medication. Some people who take this medication have severe bone, joint, or muscle pain. This medication may also increase your risk for jaw problems or a broken thigh bone. Tell your care team right away if you have severe pain in your jaw, bones, joints, or muscles. Tell you care team if you have any pain that does not go away or that gets worse. Tell your dentist and dental surgeon that you are taking this medication. You should not have major dental surgery while on this medication. See your dentist to have a dental exam and fix any dental problems before starting this medication. Take good care of your teeth while on this medication. Make sure you see your dentist for regular follow-up appointments. You should make sure you get enough calcium and vitamin D while you are taking this medication. Discuss the foods you eat and the vitamins you take with your care team. Check with your care team if you have severe diarrhea, nausea, and vomiting, or if you sweat a lot. The loss of too much body fluid may make it dangerous for you to take this medication. You may need bloodwork while taking this medication. Talk to your care team if  you wish to become pregnant or think you might be pregnant. This medication can cause serious birth defects. What side effects may I notice from receiving this medication? Side effects that you should report to your care team as soon as possible: Allergic reactions--skin rash, itching, hives, swelling of the face, lips, tongue, or  throat Kidney injury--decrease in the amount of urine, swelling of the ankles, hands, or feet Low calcium level--muscle pain or cramps, confusion, tingling, or numbness in the hands or feet Osteonecrosis of the jaw--pain, swelling, or redness in the mouth, numbness of the jaw, poor healing after dental work, unusual discharge from the mouth, visible bones in the mouth Severe bone, joint, or muscle pain Side effects that usually do not require medical attention (report to your care team if they continue or are bothersome): Constipation Fatigue Fever Loss of appetite Nausea Stomach pain This list may not describe all possible side effects. Call your doctor for medical advice about side effects. You may report side effects to FDA at 1-800-FDA-1088. Where should I keep my medication? This medication is given in a hospital or clinic. It will not be stored at home. NOTE: This sheet is a summary. It may not cover all possible information. If you have questions about this medicine, talk to your doctor, pharmacist, or health care provider.  2024 Elsevier/Gold Standard (2021-06-19 00:00:00) Fam-Trastuzumab Deruxtecan Injection What is this medication? FAM-TRASTUZUMAB DERUXTECAN (fam-tras TOOZ eu mab DER ux TEE kan) treats some types of cancer. It works by blocking a protein that causes cancer cells to grow and multiply. This helps to slow or stop the spread of cancer cells. This medicine may be used for other purposes; ask your health care provider or pharmacist if you have questions. COMMON BRAND NAME(S): ENHERTU What should I tell my care team before I take this medication? They need to know if you have any of these conditions: Heart disease Heart failure Infection, especially a viral infection, such as chickenpox, cold sores, or herpes Liver disease Lung or breathing disease, such as asthma or COPD An unusual or allergic reaction to fam-trastuzumab deruxtecan, other medications, foods, dyes,  or preservatives Pregnant or trying to get pregnant Breast-feeding How should I use this medication? This medication is injected into a vein. It is given by your care team in a hospital or clinic setting. A special MedGuide will be given to you before each treatment. Be sure to read this information carefully each time. Talk to your care team about the use of this medication in children. Special care may be needed. Overdosage: If you think you have taken too much of this medicine contact a poison control center or emergency room at once. NOTE: This medicine is only for you. Do not share this medicine with others. What if I miss a dose? It is important not to miss your dose. Call your care team if you are unable to keep an appointment. What may interact with this medication? Interactions are not expected. This list may not describe all possible interactions. Give your health care provider a list of all the medicines, herbs, non-prescription drugs, or dietary supplements you use. Also tell them if you smoke, drink alcohol, or use illegal drugs. Some items may interact with your medicine. What should I watch for while using this medication? Visit your care team for regular checks on your progress. Tell your care team if your symptoms do not start to get better or if they get worse. Your condition will be monitored carefully  while you are receiving this medication. Do not become pregnant while taking this medication or for 7 months after stopping it. Women should inform their care team if they wish to become pregnant or think they might be pregnant. Men should not father a child while taking this medication and for 4 months after stopping it. There is potential for serious side effects to an unborn child. Talk to your care team for more information. Do not breast-feed an infant while taking this medication or for 7 months after the last dose. This medication has caused decreased sperm counts in some  men. This may make it more difficult to father a child. Talk to your care team if you are concerned about your fertility. This medication may increase your risk to bruise or bleed. Call your care team if you notice any unusual bleeding. Be careful brushing or flossing your teeth or using a toothpick because you may get an infection or bleed more easily. If you have any dental work done, tell your dentist you are receiving this medication. This medication may cause dry eyes and blurred vision. If you wear contact lenses, you may feel some discomfort. Lubricating eye drops may help. See your care team if the problem does not go away or is severe. This medication may increase your risk of getting an infection. Call your care team for advice if you get a fever, chills, sore throat, or other symptoms of a cold or flu. Do not treat yourself. Try to avoid being around people who are sick. Avoid taking medications that contain aspirin, acetaminophen, ibuprofen, naproxen, or ketoprofen unless instructed by your care team. These medications may hide a fever. What side effects may I notice from receiving this medication? Side effects that you should report to your care team as soon as possible: Allergic reactions--skin rash, itching, hives, swelling of the face, lips, tongue, or throat Dry cough, shortness of breath or trouble breathing Infection--fever, chills, cough, sore throat, wounds that don't heal, pain or trouble when passing urine, general feeling of discomfort or being unwell Heart failure--shortness of breath, swelling of the ankles, feet, or hands, sudden weight gain, unusual weakness or fatigue Unusual bruising or bleeding Side effects that usually do not require medical attention (report these to your care team if they continue or are bothersome): Constipation Diarrhea Hair loss Muscle pain Nausea Vomiting This list may not describe all possible side effects. Call your doctor for medical  advice about side effects. You may report side effects to FDA at 1-800-FDA-1088. Where should I keep my medication? This medication is given in a hospital or clinic. It will not be stored at home. NOTE: This sheet is a summary. It may not cover all possible information. If you have questions about this medicine, talk to your doctor, pharmacist, or health care provider.  2024 [employer]/Gold Standard (2021-02-10 00:00:00)

## 2023-05-25 NOTE — Progress Notes (Signed)
[facility]  [address] [place],  [place]  [address] [phone]  Clinic Day: 05/25/23  Referring physician: [name], MD  ASSESSMENT & PLAN:  Assessment: Malignant neoplasm metastatic to liver [facility]) Numerous heterogeneously enhancing, internally necrotic lesions in the right hepatic lobe CT in December 2023, which was consistent with metastatic disease. Largest lesion measures up to 6.7 cm and there are at least 6 lesions.  Liver biopsy was consistent with metastatic breast cancer with negative estrogen and progesterone receptors and positive HER2 receptor. She was initially treated with trastuzumab with an excellent response with significant decrease in the size of her liver lesions and sustained response. She later had progression of this disease in the chest wall and was switched to Enhertu.  CT chest, abdomen, and pelvis in March, 2024 revealed stable subtle liver lesions with at least 1 lesion is no longer visualized. CT chest, abdomen and pelvis in October, 2024 revealed a decrease in the hepatic lesions with no new lesions seen.  She continues Enhertu every 3 weeks without significant difficulty.  She continues to have tingling and weakness of the left hand and has seen orthopedics with a repeat EMG that was abnormal.  She states Dr. Dierdre Searles mentions surgery and she is scheduled to see him next month.      Malignant neoplasm metastatic to bone [facility]) Bone metastases diagnosed in December 2022, for which she was placed on zoledronic acid every 4 weeks. She is now receiving zoledronic acid every 6 weeks to correspond with her chemotherapy.  She has borderline hypercalcemia.  CT imaging in October, 2024 did not reveal any evidence of progressive disease.  Her last zoledronic acid was on October 22, so she will receive this today.   Chest wall recurrence of breast cancer, left ([name]) Left chest wall recurrence diagnosed in January 2023.  Biopsy revealed carcinoma consistent  with her previous breast cancer with negative estrogen and progesterone receptors and positive HER2 receptor.  She was initially treated with docetaxel/trastuzumab and received 10 cycles with a good response.  She then had recurrence in the left chest wall in September 2023, so was switched to Enhertu good response.  CT imaging in March revealed stable extensive sclerotic bone metastase with a stable compression of L2 and T6, as well as stable to improved liver metastasis.  She does not have evidence of recurrent chest wall lesions.  Most recent CT chest, abdomen and pelvis in October revealed a decrease in the hepatic metastasis and no new metastasis.     Polyneuropathy She has had chronic left hand pain, initially thought to be related to lymphedema.  Her lymphedema was controlled. She had decreased strength and muscle wasting.  She was referred to neurology for further evaluation.  EMG revealed polyneuropathy.  She has now seen orthopedics and had an MRI of the left brachial plexus that was unremarkable, but limited due to patient motion.  She had been on gabapentin 100 mg at bedtime, but discontinued that on her own. She saw Dr. Dierdre Searles In October and underwent repeat EMG, which was more abnormal.    Right shoulder pain She reports chronic right shoulder pain and decreased range of motion that improved previously with a steroid injection in the shoulder. It is now worsening. We will refer her back to the orthopedic surgeon who did her previous injection.   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 She had a echocardiogram done on 05/24/2023 with a ejection fraction of 60-65%. Her day 1 cycle 24 of Enhertu is scheduled on 05/25/2023 and we will proceed. She has a WBC of 5.1, hemoglobin of 13.1, and platelet count of 151,000. Her CMP is also completley normal. I will see her back in 3 weeks with  CBC and CMP. The patient understands the plans discussed today and is in agreement with them.  She knows to contact our office if she develops concerns prior to her next appointment.  I provided 20 minutes of face-to-face time during this encounter and > 50% was spent counseling as documented under my assessment and plan.   [name], MD [name] [address] [place] [address] Dept: [phone] Dept Fax: [phone]   [name].  CHIEF COMPLAINT:  CC: Recurrent HER2 receptor 2+ breast cancer  Current Treatment:  Enhertu every 3 weeks  HISTORY OF PRESENT ILLNESS:   Oncology History Overview Note  Cancer Staging Malignant neoplasm of upper-outer quadrant of left breast in female, estrogen receptor negative ([name]) Staging form: Breast, AJCC 8th Edition - Clinical stage from 10/11/2018: Stage IIB (cT2, cN1, cM0, G3, ER-, PR-, HER2+) - Signed by Malachy Mood, MD on 11/02/2018 - Clinical: No stage assigned - Unsigned    History of colon cancer, stage II  09/2005 Initial Diagnosis   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 Findings were a 5.9 x 3.8 x 1.2 cm moderate to well-differentiated adenocarcinoma penetrating the muscular wall. Forty lymph nodes negative. No vascular or lymphatic invasion. Multiple diverticula with microabscess formation and inflammation. Carcinoma present in at least 1 diverticulum. Preop CEA 5.2 with lab normal range 0 to 2.5. Preop CT scan with no obvious additional pathology.    2007 -  Chemotherapy   She received oral Xeloda chemotherapy as an adjuvant. She declined treatment on a clinical trial.    11/12/2011 Initial Diagnosis   [name], stage II   12/09/2011 Procedure   Followup colonoscopy done on 12/09/2011.  She was found to have 2 polyps which were removed. Chronic diverticulosis.     Malignant neoplasm of upper-outer quadrant of left breast in female, estrogen receptor negative ([name])  09/26/2018 Mammogram   Mammogram/US of left breast 09/26/18 IMPRESSION:  1. 2.9cm irregular mass in the upper outer left breast corresponds to the palpable abnormality. This is highly suspicious for breast carcinoma.  2. Two adjacent abnormal left axillary  Lymph nodes suspicious for metastatic adenopathy. There is a third borderline abnormal left axillary LN with a cortex thickened to 4mm.  3. Benign right breast cyst. No evidence of right breast malignancy.    10/11/2018 Cancer Staging   Staging form: Breast, AJCC 8th Edition - Clinical stage from 10/11/2018: Stage IIB (cT2, cN1, cM0, G3, ER-, PR-, HER2+) - Signed by Malachy Mood, MD on 11/02/2018   10/11/2018 Initial Biopsy   Diagnosis 10/11/18 1. Breast, left, needle core biopsy, upper outer left 2 o'clock - INVASIVE DUCTAL CARCINOMA. - DUCTAL CARCINOMA IN SITU. - LYMPHOVASCULAR INVASION IS IDENTIFIED. - SEE COMMENT. 2. Lymph node, needle/core biopsy, left axilla - INVASIVE DUCTAL CARCINOMA. - SEE COMMENT.   10/11/2018 Receptors her2   The tumor cells are POSITIVE for  Her2 (3+). Estrogen Receptor: 0%, NEGATIVE Progesterone Receptor: 0%, NEGATIVE Proliferation Marker Ki67: 70%   11/02/2018 Initial Diagnosis   Malignant neoplasm of upper-outer quadrant of left breast in female, estrogen receptor negative ([name])   11/15/2018 Breast MRI   MRI breast 11/15/18  IMPRESSION: 1. 3.3 centimeter mass in the LATERAL portion of the LEFT breast consistent with known malignancy. 2. There is significant non mass enhancement surrounding this mass and extending anteriorly into the nipple base, with largest diameter in the anterior to posterior axis, measuring 7.2 centimeters. 3. If the patient would consider breast conservation, additional MR guided core biopsies are recommended.  Consider biopsy of the inferior and anterior extent of the non mass enhancement to document extent of disease. 4. Three enlarged LEFT axillary lymph nodes. 5. RIGHT breast is negative.   11/15/2018 PET scan   PET 11/15/18 IMPRESSION: Hypermetabolic left breast lesion compatible with known primary. Hypermetabolic left axillary lymph nodes are consistent with metastatic disease.   No evidence for additional hypermetabolic metastatic involvement in the neck, chest, abdomen, or pelvis.   11/17/2018 - 03/01/2019 Chemotherapy   Neo-adjuvant Kadcyla and perjeta q3weeks for 6 cycles starting 11/17/18. Stopped before surgery    11/29/2018 Pathology Results   Diagnosis 1. Breast, left, needle core biopsy, inferior anterior (cylinder clip) - INVASIVE DUCTAL CARCINOMA. - DUCTAL CARCINOMA IN SITU. - LYMPHOVASCULAR INVASION IS IDENTIFIED. - SEE COMMENT. 2. Breast, left, needle core biopsy, central posterior (barbell clip) - INVASIVE DUCTAL CARCINOMA. - LYMPHOVASCULAR INVASION IS IDENTIFIED.   02/12/2019 Breast MRI   IMPRESSION: 1. Complete resolution of previously identified enhancing mass and associated non mass enhancement within the left breast. This is consistent with excellent response to chemotherapy. No residual or suspicious findings are identified. 2. No MRI evidence of malignancy on the right. 3. Previously identified left axillary lymphadenopathy not definitively seen on today's study. However, this may be due to decreased field-of-view compared to prior study.     03/14/2019 Cancer Staging   Staging form: Breast, AJCC 8th Edition - Pathologic stage from 03/14/2019: pT0, pN0, cM0, GX, ER: Unknown, PR: Unknown, HER2: Not Assessed - Signed by Malachy Mood, MD on 03/28/2019   03/14/2019 Surgery   LEFT MASTECTOMY WITH TARGETED LYMPH NODE DISSECTION and Left Axillary Sentinel Lymph  Node Biopsy by Dr Carolynne Edouard 03/14/19    03/14/2019 Pathology Results   FINAL MICROSCOPIC DIAGNOSIS:   A. LYMPH  NODE, LEFT, SENTINEL, BIOPSY:  - There is no evidence of carcinoma in 1 of 1 lymph node (0/1).   B. BREAST, LEFT, MASTECTOMY:  - Benign breast parenchyma with treatment-related changes.  - There is no evidence of malignancy.  - See oncology table below.   C. LYMPH NODE, LEFT #1, SENTINEL, BIOPSY:  - There is no evidence of carcinoma in 1 of 1 lymph node (0/1).   D. LYMPH NODE, LEFT #2, SENTINEL, BIOPSY:  - There is no evidence of carcinoma in 1 of 1 lymph node (0/1).     04/04/2019 - 04/25/2019 Chemotherapy   Maintenance Herceptin injections every 3 weeks starting 04/03/19 to complete 1 year of treatment that was started in 11/2018. Stopped after 2nd dose as she will not be repeating Echos.    06/03/2021 - 06/24/2021 Chemotherapy   Patient is on Treatment Plan : BREAST Trastuzumab q21d X 11 Cycles     06/25/2021 - 01/01/2022 Chemotherapy   Patient is on Treatment Plan : BREAST Docetaxel + Trastuzumab + Pertuzumab (THP) q21d     06/25/2021 - 01/02/2022 Chemotherapy  Patient is on Treatment Plan : BREAST Docetaxel + Trastuzumab + Pertuzumab (THP) q21d     09/14/2021 Imaging   CT chest/abdomen/pelvis  IMPRESSION:  1. Interval resolution of previously seen bulky left axillary and  subpectoral lymphadenopathy. No persistently enlarged lymph nodes.  2. Multiple liver lesions are significantly diminished in size.  3. Widespread osseous metastatic disease, with an interval increase  in sclerosis of several previously lytic lesions.  4. Constellation of findings is consistent with treatment response  of metastatic disease  5. New, although age indeterminate pathologic wedge deformity of the  T6 vertebral body as well as an increased pathologic wedge deformity  of the L2 vertebral body.  6. Coronary artery disease.  Aortic Atherosclerosis (ICD10-I70.0).    01/26/2022 -  Chemotherapy   Patient is on Treatment Plan : BREAST METASTATIC Fam-Trastuzumab Deruxtecan-nxki (Enhertu) (5.4) q21d      Malignant neoplasm metastatic to liver ([name])  04/17/2021 Imaging   CT ABDOMEN AND PELVIS WITH CONTRAST: -New lytic lesion involving the L2 vertebral body (6:72, 2:26) with minimal cortical breakthrough superiorly, but with preservation of vertebral body height, concerning for metastatic disease.  -There are several indeterminate hepatic lesions as described above. In addition to the lesions described above, there is an additional subtle 1.5 cm hypodensity in the hepatic dome (2:7). Given clinical history and presence of a new L2 lesion, leading differential consideration is metastatic disease. Recommend further evaluation with MRI of the abdomen with and without contrast.   05/12/2021 Imaging   MRI ABDOMEN WITH AND WITHOUT CONTRAST: Numerous heterogeneously enhancing, internally necrotic lesions in the right hepatic lobe, highly concerning for metastatic disease. Largest lesion measures up to 6.7 cm in hepatic segment VI.   Enhancing osseous lesions in the L2 vertebral body, both iliac bones, and in the sacrum, highly concerning for osseous metastases.   05/15/2021 Initial Diagnosis   Liver metastases ([name])   05/27/2021 PET scan   1. Widespread recurrent/metastatic disease in this patient who is  status post bilateral mastectomy. Left chest wall recurrence with  left axillary/subpectoral, supraclavicular nodal metastasis.  2. Hepatic, left adrenal, and widespread osseous metastasis.  3. Incidental findings, including: Right nephrolithiasis. Tiny  hiatal hernia.    05/27/2021 Imaging   MRI LUMBAR AND THORACIC SPINE: Thoracic spine:  Osseous metastatic disease involving each level. The most dramatic deposits are at T4 and T6 where there is extraosseous/epidural tumor. No cord compression but there is foraminal impingement on the left at T6-7 and on the right at T3-4. Early dorsal epidural tumor at the level of T10 and T3.   Lumbar spine:  1. Widespread osseous metastatic disease with largest  deposit  replacing the L2 body where there is a compression fracture with  mild height loss. Also at this level is early epidural tumor  extension likely affecting both L2-3 foramina.  2. 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    05/27/2021 Imaging   MRI HEAD WITH AND WITHOUT CONTRAST: Negative for metastatic disease to the brain or calvarium.   06/03/2021 - 06/24/2021 Chemotherapy   Patient is on Treatment Plan : BREAST Trastuzumab q21d X 11 Cycles     06/25/2021 - 01/01/2022 Chemotherapy   Patient is on Treatment Plan : BREAST Docetaxel + Trastuzumab + Pertuzumab (THP) q21d     06/25/2021 - 01/02/2022 Chemotherapy   Patient is on Treatment Plan : BREAST Docetaxel + Trastuzumab + Pertuzumab (THP) q21d     09/14/2021 Imaging   CT chest/abdomen/pelvis  IMPRESSION:  1. Interval resolution of previously seen bulky left axillary and  subpectoral lymphadenopathy. No persistently enlarged lymph nodes.  2. Multiple liver lesions are significantly diminished in size.  3. Widespread osseous metastatic disease, with an interval increase  in sclerosis of several previously lytic lesions.  4. Constellation of findings is consistent with treatment response  of metastatic disease  5. New, although age indeterminate pathologic wedge deformity of the  T6 vertebral body as well as an increased pathologic wedge deformity  of the L2 vertebral body.  6. Coronary artery disease.  Aortic Atherosclerosis (ICD10-I70.0).    01/26/2022 -  Chemotherapy   Patient is on Treatment Plan : BREAST METASTATIC Fam-Trastuzumab Deruxtecan-nxki (Enhertu) (5.4) q21d     Malignant neoplasm metastatic to bone ([name])  04/17/2021 Imaging   CT ABDOMEN AND PELVIS WITH CONTRAST: -New lytic lesion involving the L2 vertebral body (6:72, 2:26) with minimal cortical breakthrough superiorly, but with preservation of vertebral body height, concerning for metastatic disease.  -There are several  indeterminate hepatic lesions as described above. In addition to the lesions described above, there is an additional subtle 1.5 cm hypodensity in the hepatic dome (2:7). Given clinical history and presence of a new L2 lesion, leading differential consideration is metastatic disease. Recommend further evaluation with MRI of the abdomen with and without contrast.   05/12/2021 Imaging   MRI ABDOMEN WITH AND WITHOUT CONTRAST: Numerous heterogeneously enhancing, internally necrotic lesions in the right hepatic lobe, highly concerning for metastatic disease. Largest lesion measures up to 6.7 cm in hepatic segment VI.   Enhancing osseous lesions in the L2 vertebral body, both iliac bones, and in the sacrum, highly concerning for osseous metastases.   05/15/2021 Initial Diagnosis   Bone metastases ([name])   05/27/2021 PET scan   1. Widespread recurrent/metastatic disease in this patient who is  status post bilateral mastectomy. Left chest wall recurrence with  left axillary/subpectoral, supraclavicular nodal metastasis.  2. Hepatic, left adrenal, and widespread osseous metastasis.  3. Incidental findings, including: Right nephrolithiasis. Tiny  hiatal hernia.    05/27/2021 Imaging   MRI LUMBAR AND THORACIC SPINE: Thoracic spine:  Osseous metastatic disease involving each level. The most dramatic deposits are at T4 and T6 where there is extraosseous/epidural tumor. No cord compression but there is foraminal impingement on the left at T6-7 and on the right at T3-4. Early dorsal epidural tumor at the level of T10 and T3.   Lumbar spine:  1. Widespread osseous metastatic disease with largest deposit  replacing the L2 body where there is a compression fracture with  mild height loss. Also at this level is early epidural tumor  extension likely affecting both L2-3 foramina.  2. 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    05/27/2021 Imaging   MRI HEAD WITH AND WITHOUT  CONTRAST: Negative for metastatic disease to the brain or calvarium.   06/03/2021 - 06/24/2021 Chemotherapy   Patient is on Treatment Plan : BREAST Trastuzumab q21d X 11 Cycles     06/25/2021 - 01/01/2022 Chemotherapy   Patient is on Treatment Plan : BREAST Docetaxel + Trastuzumab + Pertuzumab (THP) q21d     06/25/2021 - 01/02/2022 Chemotherapy   Patient is on Treatment Plan : BREAST Docetaxel + Trastuzumab + Pertuzumab (THP) q21d     09/14/2021 Imaging   CT chest/abdomen/pelvis  IMPRESSION:  1. Interval resolution of previously seen bulky left axillary and  subpectoral lymphadenopathy. No persistently enlarged lymph nodes.  2. Multiple liver lesions are significantly diminished in  size.  3. Widespread osseous metastatic disease, with an interval increase  in sclerosis of several previously lytic lesions.  4. Constellation of findings is consistent with treatment response  of metastatic disease  5. New, although age indeterminate pathologic wedge deformity of the  T6 vertebral body as well as an increased pathologic wedge deformity  of the L2 vertebral body.  6. Coronary artery disease.  Aortic Atherosclerosis (ICD10-I70.0).    01/26/2022 -  Chemotherapy   Patient is on Treatment Plan : BREAST METASTATIC Fam-Trastuzumab Deruxtecan-nxki (Enhertu) (5.4) q21d         INTERVAL HISTORY:  Jasmin Lewis is here today for repeat clinical assessment for her recurrent HER2 receptor breast cancer. Patient states that she feels ok but complains of chronic tearing of the eye and severe left hand weakness. She met with an occupational therapist for her left hand weakness to no avail. She is understandably frustrated with meeting with so many physicians and therapists with no results. She had a echocardiogram done on 05/24/2023 with a ejection fraction of 60-65%. Her day 1 cycle 24 of Enhertu is scheduled today. She has a WBC of 5.1, hemoglobin of 13.1, and platelet count of 151,000. Her CMP is also completley  normal. I will see her back in 3 weeks with CBC and CMP. She denies signs of infection such as sore throat, sinus or urinary symptoms.  She denies fevers or recurrent chills. She denies nausea, vomiting, chest pain, dyspnea or cough. Her appetite is off and on and her weight has been stable.  REVIEW OF SYSTEMS:  Review of Systems  Constitutional:  Negative for appetite change, chills, diaphoresis, fatigue, fever and unexpected weight change.  HENT:  Negative.  Negative for hearing loss, lump/mass, mouth sores, nosebleeds, sore throat, tinnitus, trouble swallowing and voice change.   Eyes:  Positive for eye problems (chronic tearing of the eyes). Negative for icterus.  Respiratory: Negative.  Negative for chest tightness, cough, hemoptysis, shortness of breath and wheezing.   Cardiovascular: Negative.  Negative for chest pain, leg swelling and palpitations.  Gastrointestinal: Negative.  Negative for abdominal distention, abdominal pain, blood in stool, constipation, diarrhea, nausea, rectal pain and vomiting.  Endocrine: Negative.   Genitourinary: Negative.  Negative for bladder incontinence, difficulty urinating, dyspareunia, dysuria, frequency, hematuria, menstrual problem, nocturia, pelvic pain, vaginal bleeding and vaginal discharge.   Musculoskeletal:  Positive for arthralgias (bilateral hands, with neuropathy left hand) and gait problem (walks with cane). Negative for back pain, flank pain, myalgias, neck pain and neck stiffness.  Skin: Negative.  Negative for itching, rash and wound.  Neurological:  Positive for extremity weakness (left hand) and gait problem (walks with cane). Negative for dizziness, headaches, light-headedness, numbness, seizures and speech difficulty.  Hematological: Negative.  Negative for adenopathy. Does not bruise/bleed easily.  Psychiatric/Behavioral: Negative.  Negative for confusion, decreased concentration, depression, sleep disturbance and suicidal ideas. The  patient is not nervous/anxious.      VITALS:  Blood pressure (!) 149/78, pulse 80, temperature 97.8 F (36.6 C), temperature source Oral, resp. rate 18, height 5' 0.25" (1.53 m), weight 111 lb 11.2 oz (50.7 kg), SpO2 98%.  Wt Readings from Last 3 Encounters:  05/25/23 111 lb 11.2 oz (50.7 kg)  05/02/23 110 lb 9.6 oz (50.2 kg)  04/12/23 111 lb 8 oz (50.6 kg)    Body mass index is 21.63 kg/m.  Performance status (ECOG): 2 - Symptomatic, <50% confined to bed  PHYSICAL EXAM:  Physical Exam Vitals and nursing note reviewed.  Constitutional:  General: She is not in acute distress.    Appearance: Normal appearance. She is not ill-appearing.  HENT:     Head: Normocephalic and atraumatic.     Right Ear: Tympanic membrane, ear canal and external ear normal. There is no impacted cerumen.     Left Ear: Tympanic membrane, ear canal and external ear normal. There is no impacted cerumen.     Nose: Nose normal. No congestion or rhinorrhea.     Mouth/Throat:     Mouth: Mucous membranes are moist.     Pharynx: Oropharynx is clear. No oropharyngeal exudate or posterior oropharyngeal erythema.  Eyes:     General: No scleral icterus.       Right eye: No discharge.        Left eye: No discharge.     Extraocular Movements: Extraocular movements intact.     Conjunctiva/sclera: Conjunctivae normal.     Pupils: Pupils are equal, round, and reactive to light.  Cardiovascular:     Rate and Rhythm: Normal rate and regular rhythm.     Pulses: Normal pulses.     Heart sounds: Normal heart sounds. No murmur heard.    No friction rub. No gallop.  Pulmonary:     Effort: Pulmonary effort is normal. No respiratory distress.     Breath sounds: Normal breath sounds. No stridor. No wheezing, rhonchi or rales.  Chest:     Chest wall: No tenderness.  Breasts:    Right: Normal. No inverted nipple, mass, nipple discharge or skin change.     Left: Absent.     Comments: Left chest wall site is  negative Abdominal:     General: Bowel sounds are normal. There is no distension.     Palpations: Abdomen is soft. There is no mass.     Tenderness: There is no abdominal tenderness.  Musculoskeletal:        General: Deformity (muscle wasting left hand, bilateral hand joint abnormality) present. Normal range of motion.     Cervical back: Normal range of motion and neck supple. No tenderness.     Right lower leg: No edema.     Left lower leg: No edema.     Comments: Severe atrophy of all muscles of the left hand  Lymphadenopathy:     Cervical: No cervical adenopathy.     Upper Body:     Right upper body: No supraclavicular or axillary adenopathy.     Left upper body: No supraclavicular or axillary adenopathy.  Skin:    General: Skin is warm and dry.     Coloration: Skin is not jaundiced.     Findings: No rash.  Neurological:     General: No focal deficit present.     Mental Status: She is alert and oriented to person, place, and time. Mental status is at baseline.     Cranial Nerves: No cranial nerve deficit.     Motor: No weakness.     Gait: Gait normal.  Psychiatric:        Mood and Affect: Mood normal.        Behavior: Behavior normal.        Thought Content: Thought content normal.        Judgment: Judgment normal.    LABS:      Latest Ref Rng & Units 05/25/2023   12:56 PM 05/02/2023    1:02 PM 04/12/2023    1:00 PM  CBC  WBC 4.0 - 10.5 K/uL 5.1  5.9  5.6  Hemoglobin 12.0 - 15.0 g/dL 40.9  81.1  91.4   Hematocrit 36.0 - 46.0 % 37.2  38.0  39.0   Platelets 150 - 400 K/uL 151  193  192       Latest Ref Rng & Units 05/25/2023   12:56 PM 05/02/2023    1:02 PM 04/12/2023    1:00 PM  CMP  Glucose 70 - 99 mg/dL 98  89  782   BUN 8 - 23 mg/dL 23  17  22    Creatinine 0.44 - 1.00 mg/dL 9.56  2.13  0.86   Sodium 135 - 145 mmol/L 142  140  141   Potassium 3.5 - 5.1 mmol/L 4.3  4.4  4.6   Chloride 98 - 111 mmol/L 106  103  106   CO2 22 - 32 mmol/L 27  25  25    Calcium  8.9 - 10.3 mg/dL 57.8  46.9  62.9   Total Protein 6.5 - 8.1 g/dL 6.9  7.1  6.8   Total Bilirubin 0.0 - 1.2 mg/dL 0.4  0.6  0.4   Alkaline Phos 38 - 126 U/L 70  68  68   AST 15 - 41 U/L 38  39  40   ALT 0 - 44 U/L 20  20  20       Lab Results  Component Value Date   CEA1 1.8 06/03/2021   CEA 0.5 11/13/2012   /  CEA  Date Value Ref Range Status  06/03/2021 1.8 0.0 - 4.7 ng/mL Final    Comment:    (NOTE)                             Nonsmokers          <3.9                             Smokers             <5.6 [employer] Electrochemiluminescence Immunoassay (ECLIA) Values obtained with different assay methods or kits cannot be used interchangeably.  Results cannot be interpreted as absolute evidence of the presence or absence of malignant disease. Performed At: [facility] [address] [place], [place] [id number] Jolene Schimke MD WN:[id number]   11/13/2012 0.5 0.0 - 5.0 ng/mL Final   No results found for: "PSA1" No results found for: "IHK742" No results found for: "CAN125"  No results found for: "TOTALPROTELP", "ALBUMINELP", "A1GS", "A2GS", "BETS", "BETA2SER", "GAMS", "MSPIKE", "SPEI" No results found for: "TIBC", "FERRITIN", "IRONPCTSAT" Lab Results  Component Value Date   LDH 186 11/12/2011   LDH 187 09/08/2010   LDH 171 09/09/2009    STUDIES:  ECHOCARDIOGRAM COMPLETE Result Date: 05/24/2023    ECHOCARDIOGRAM REPORT   Patient Name:   Jasmin Lewis Date of Exam: 05/24/2023 Medical Rec #:  [id number]         Height:       60.2 in Accession #:    [id number]        Weight:       110.6 lb Date of Birth:  06-Nov-1931         BSA:          1.455 m Patient Age:    89 years          BP:           160/87 mmHg Patient Gender: F  HR:           78 bpm. Exam Location:  [facility] Procedure: 2D Echo, Cardiac Doppler, Color Doppler and Strain Analysis Indications:    Secondary malignant neoplasm of bone and bone marrow ([name])                 [C79.51, C79.52  (ICD-10-CM)]; Poisoning by antineoplastic and                 immunosuppressive drugs, undetermined, sequela [T45.1X4S                 (ICD-10-CM)]  History:        Patient has prior history of Echocardiogram examinations, most                 recent 10/21/2022.  Sonographer:    Margreta Journey RDCS Referring Phys: [id number] Blucher H Hadlei Stitt IMPRESSIONS  1. Left ventricular ejection fraction, by estimation, is 60 to 65%. The left ventricle has normal function. The left ventricle has no regional wall motion abnormalities. There is mild left ventricular hypertrophy. Left ventricular diastolic parameters were normal. The average left ventricular global longitudinal strain is -12.3 %.  2. Right ventricular systolic function is normal. The right ventricular size is normal. There is mildly elevated pulmonary artery systolic pressure. The estimated right ventricular systolic pressure is 35.7 mmHg.  3. The mitral valve is degenerative. Trace to mild mitral valve regurgitation. No evidence of mitral stenosis. Moderate mitral annular calcification.  4. The aortic valve is normal in structure. Aortic valve regurgitation is mild. No aortic stenosis is present.  5. There is Moderate (Grade III) protruding plaque involving the ascending aorta.  6. The inferior vena cava is normal in size with greater than 50% respiratory variability, suggesting right atrial pressure of 3 mmHg. Comparison(s): In comparison prior echocardiogram report from 03/24/2022 reported LVEF 60 to 65%, mild aortic insufficiency. MR was not reported, but appears trace on the image review. FINDINGS  Left Ventricle: Left ventricular ejection fraction, by estimation, is 60 to 65%. The left ventricle has normal function. The left ventricle has no regional wall motion abnormalities. The average left ventricular global longitudinal strain is -12.3 %. The left ventricular internal cavity size was normal in size. There is mild left ventricular hypertrophy. Left  ventricular diastolic parameters were normal. Right Ventricle: The right ventricular size is normal. Right vetricular wall thickness was not well visualized. Right ventricular systolic function is normal. There is mildly elevated pulmonary artery systolic pressure. The tricuspid regurgitant velocity  is 2.86 m/s, and with an assumed right atrial pressure of 3 mmHg, the estimated right ventricular systolic pressure is 35.7 mmHg. Left Atrium: Left atrial size was normal in size. Right Atrium: Right atrial size was normal in size. Pericardium: There is no evidence of pericardial effusion. Mitral Valve: The mitral valve is degenerative in appearance. There is mild calcification of the mitral valve leaflet(s). Moderate mitral annular calcification. Trace to mild mitral valve regurgitation. No evidence of mitral valve stenosis. Tricuspid Valve: The tricuspid valve is grossly normal. Tricuspid valve regurgitation is mild . No evidence of tricuspid stenosis. Aortic Valve: The aortic valve is normal in structure. Aortic valve regurgitation is mild. Aortic regurgitation PHT measures 522 msec. No aortic stenosis is present. Aortic valve mean gradient measures 4.5 mmHg. Aortic valve peak gradient measures 8.7 mmHg. Aortic valve area, by VTI measures 1.66 cm. Pulmonic Valve: The pulmonic valve was not well visualized. Pulmonic valve regurgitation is not visualized. No evidence of pulmonic  stenosis. Aorta: The aortic root and ascending aorta are structurally normal, with no evidence of dilitation. There is moderate (Grade III) protruding plaque involving the ascending aorta. Venous: The inferior vena cava is normal in size with greater than 50% respiratory variability, suggesting right atrial pressure of 3 mmHg. IAS/Shunts: The interatrial septum was not well visualized.  LEFT VENTRICLE PLAX 2D LVIDd:         3.70 cm   Diastology LVIDs:         2.40 cm   LV e' medial:    7.83 cm/s LV PW:         1.00 cm   LV E/e' medial:  9.1 LV  IVS:        1.00 cm   LV e' lateral:   11.50 cm/s LVOT diam:     1.90 cm   LV E/e' lateral: 6.2 LV SV:         52 LV SV Index:   35        2D Longitudinal Strain LVOT Area:     2.84 cm  2D Strain GLS Avg:     -12.3 %  RIGHT VENTRICLE             IVC RV Basal diam:  3.80 cm     IVC diam: 1.10 cm RV Mid diam:    2.80 cm RV S prime:     21.10 cm/s TAPSE (M-mode): 3.1 cm LEFT ATRIUM             Index        RIGHT ATRIUM           Index LA diam:        2.90 cm 1.99 cm/m   RA Area:     14.70 cm LA Vol (A2C):   25.3 ml 17.38 ml/m  RA Volume:   32.70 ml  22.47 ml/m LA Vol (A4C):   30.4 ml 20.89 ml/m LA Biplane Vol: 27.1 ml 18.62 ml/m  AORTIC VALVE AV Area (Vmax):    1.94 cm AV Area (Vmean):   1.77 cm AV Area (VTI):     1.66 cm AV Vmax:           147.50 cm/s AV Vmean:          102.700 cm/s AV VTI:            0.312 m AV Peak Grad:      8.7 mmHg AV Mean Grad:      4.5 mmHg LVOT Vmax:         100.77 cm/s LVOT Vmean:        63.967 cm/s LVOT VTI:          0.182 m LVOT/AV VTI ratio: 0.58 AI PHT:            522 msec  AORTA Ao Root diam: 3.00 cm Ao Asc diam:  3.20 cm Ao Desc diam: 1.50 cm MITRAL VALVE                TRICUSPID VALVE MV Area (PHT): 3.88 cm     TR Peak grad:   32.7 mmHg MV Decel Time: 196 msec     TR Vmax:        286.00 cm/s MV E velocity: 71.30 cm/s MV A velocity: 103.00 cm/s  SHUNTS MV E/A ratio:  0.69         Systemic VTI:  0.18 m  Systemic Diam: 1.90 cm Sreedhar reddy Madireddy Electronically signed by Vern Claude reddy Madireddy Signature Date/Time: 05/24/2023/5:42:12 PM    Final       HISTORY:   Past Medical History:  Diagnosis Date   Arthritis    shoulder   Breast cancer ([name])    Cancer ([name])    left breast ca   Colon cancer ([name])    [name], stage II 11/12/2011   Sigmoid lesion 5.9 cm  40 nodes negative but focus of cancer in a diverticum   Pre-op CEA 5.2 with lab normal up to 2.5 resected 09/21/05   Xeloda adjuvant chemotherapy   Hypertension     Polyneuropathy 02/03/2023   Right shoulder pain 04/12/2023    Past Surgical History:  Procedure Laterality Date   COLONOSCOPY     HEMICOLECTOMY  2007   MASTECTOMY WITH AXILLARY LYMPH NODE DISSECTION Left 03/14/2019   Procedure: LEFT MASTECTOMY WITH TARGETED LYMPH NODE DISSECTION;  Surgeon: Griselda Miner, MD;  Location: [facility] OR;  Service: General;  Laterality: Left;   SENTINEL NODE BIOPSY Left 03/14/2019   Procedure: Left Axillary Sentinel Lymph  Node Biopsy;  Surgeon: Griselda Miner, MD;  Location: [facility] OR;  Service: General;  Laterality: Left;   TONSILLECTOMY     WISDOM TOOTH EXTRACTION      Family History  Problem Relation Age of Onset   Cancer Maternal Aunt        colon cancer     Social History:  reports that she has never smoked. She has never used smokeless tobacco. She reports that she does not drink alcohol and does not use drugs.The patient is alone today.  Allergies: No Known Allergies  Current Medications: Current Outpatient Medications  Medication Sig Dispense Refill   Biotin 1 MG CAPS Take by mouth. (Patient not taking: Reported on 05/02/2023)     co-enzyme Q-10 30 MG capsule Take 100 mg by mouth daily.     fish oil-omega-3 fatty acids 1000 MG capsule Take 1 g by mouth daily. (Patient not taking: Reported on 04/12/2023)     gabapentin (NEURONTIN) 100 MG capsule Take 1 capsule (100 mg total) by mouth at bedtime. (Patient not taking: Reported on 05/02/2023) 30 capsule 5   magnesium citrate SOLN Take 0.5 Bottles by mouth as needed for severe constipation.     Multiple Vitamin (MULTIVITAMIN) capsule Take 1 capsule by mouth daily.     ondansetron (ZOFRAN) 4 MG tablet Take 1 tablet (4 mg total) by mouth every 8 (eight) hours as needed for nausea or vomiting. (Patient not taking: Reported on 05/02/2023) 20 tablet 2   polyethylene glycol (MIRALAX / GLYCOLAX) 17 g packet Take 17 g by mouth as needed. constipation     No current facility-administered medications for this visit.     I,Jasmine M Lassiter,acting as a scribe for [name], MD.,have documented all relevant documentation on the behalf of [name], MD,as directed by  [name], MD while in the presence of [name], MD.

## 2023-05-26 ENCOUNTER — Other Ambulatory Visit: Payer: Self-pay

## 2023-05-30 ENCOUNTER — Encounter: Payer: Self-pay | Admitting: Oncology

## 2023-05-31 ENCOUNTER — Other Ambulatory Visit: Payer: Self-pay

## 2023-06-04 ENCOUNTER — Other Ambulatory Visit: Payer: Self-pay

## 2023-06-15 ENCOUNTER — Encounter: Payer: Self-pay | Admitting: Hematology and Oncology

## 2023-06-15 ENCOUNTER — Inpatient Hospital Stay: Payer: Medicare Other

## 2023-06-15 ENCOUNTER — Inpatient Hospital Stay: Payer: Medicare Other | Attending: Hematology and Oncology

## 2023-06-15 ENCOUNTER — Inpatient Hospital Stay (HOSPITAL_BASED_OUTPATIENT_CLINIC_OR_DEPARTMENT_OTHER): Payer: Medicare Other | Admitting: Hematology and Oncology

## 2023-06-15 VITALS — BP 143/81 | HR 78 | Temp 98.3°F | Resp 20 | Ht 60.25 in | Wt 109.0 lb

## 2023-06-15 DIAGNOSIS — G629 Polyneuropathy, unspecified: Secondary | ICD-10-CM | POA: Diagnosis not present

## 2023-06-15 DIAGNOSIS — Z9012 Acquired absence of left breast and nipple: Secondary | ICD-10-CM | POA: Insufficient documentation

## 2023-06-15 DIAGNOSIS — M25511 Pain in right shoulder: Secondary | ICD-10-CM | POA: Insufficient documentation

## 2023-06-15 DIAGNOSIS — C787 Secondary malignant neoplasm of liver and intrahepatic bile duct: Secondary | ICD-10-CM

## 2023-06-15 DIAGNOSIS — I972 Postmastectomy lymphedema syndrome: Secondary | ICD-10-CM | POA: Insufficient documentation

## 2023-06-15 DIAGNOSIS — I2510 Atherosclerotic heart disease of native coronary artery without angina pectoris: Secondary | ICD-10-CM | POA: Insufficient documentation

## 2023-06-15 DIAGNOSIS — C50912 Malignant neoplasm of unspecified site of left female breast: Secondary | ICD-10-CM

## 2023-06-15 DIAGNOSIS — C50412 Malignant neoplasm of upper-outer quadrant of left female breast: Secondary | ICD-10-CM | POA: Insufficient documentation

## 2023-06-15 DIAGNOSIS — C7951 Secondary malignant neoplasm of bone: Secondary | ICD-10-CM | POA: Diagnosis not present

## 2023-06-15 DIAGNOSIS — Z800 Family history of malignant neoplasm of digestive organs: Secondary | ICD-10-CM | POA: Insufficient documentation

## 2023-06-15 DIAGNOSIS — Z5112 Encounter for antineoplastic immunotherapy: Secondary | ICD-10-CM | POA: Insufficient documentation

## 2023-06-15 DIAGNOSIS — Z171 Estrogen receptor negative status [ER-]: Secondary | ICD-10-CM

## 2023-06-15 DIAGNOSIS — Z79899 Other long term (current) drug therapy: Secondary | ICD-10-CM | POA: Diagnosis not present

## 2023-06-15 DIAGNOSIS — Z85038 Personal history of other malignant neoplasm of large intestine: Secondary | ICD-10-CM | POA: Diagnosis not present

## 2023-06-15 DIAGNOSIS — C7989 Secondary malignant neoplasm of other specified sites: Secondary | ICD-10-CM

## 2023-06-15 LAB — CMP (CANCER CENTER ONLY)
ALT: 19 U/L (ref 0–44)
AST: 39 U/L (ref 15–41)
Albumin: 3.9 g/dL (ref 3.5–5.0)
Alkaline Phosphatase: 70 U/L (ref 38–126)
Anion gap: 9 (ref 5–15)
BUN: 23 mg/dL (ref 8–23)
CO2: 27 mmol/L (ref 22–32)
Calcium: 10.3 mg/dL (ref 8.9–10.3)
Chloride: 106 mmol/L (ref 98–111)
Creatinine: 0.77 mg/dL (ref 0.44–1.00)
GFR, Estimated: >60 mL/min (ref >60)
Glucose, Bld: 95 mg/dL (ref 70–99)
Potassium: 4.3 mmol/L (ref 3.5–5.1)
Sodium: 141 mmol/L (ref 135–145)
Total Bilirubin: 0.4 mg/dL (ref 0.0–1.2)
Total Protein: 6.8 g/dL (ref 6.5–8.1)

## 2023-06-15 LAB — CBC WITH DIFFERENTIAL (CANCER CENTER ONLY)
Abs Immature Granulocytes: 0.01 10*3/uL (ref 0.00–0.07)
Basophils Absolute: 0 10*3/uL (ref 0.0–0.1)
Basophils Relative: 0 %
Eosinophils Absolute: 0.1 10*3/uL (ref 0.0–0.5)
Eosinophils Relative: 1 %
HCT: 38.2 % (ref 36.0–46.0)
Hemoglobin: 13.4 g/dL (ref 12.0–15.0)
Immature Granulocytes: 0 %
Lymphocytes Relative: 28 %
Lymphs Abs: 1.5 10*3/uL (ref 0.7–4.0)
MCH: 36 pg — ABNORMAL HIGH (ref 26.0–34.0)
MCHC: 35.1 g/dL (ref 30.0–36.0)
MCV: 102.7 fL — ABNORMAL HIGH (ref 80.0–100.0)
Monocytes Absolute: 0.6 10*3/uL (ref 0.1–1.0)
Monocytes Relative: 12 %
Neutro Abs: 3.3 10*3/uL (ref 1.7–7.7)
Neutrophils Relative %: 59 %
Platelet Count: 181 10*3/uL (ref 150–400)
RBC: 3.72 MIL/uL — ABNORMAL LOW (ref 3.87–5.11)
RDW: 13.8 % (ref 11.5–15.5)
WBC Count: 5.5 10*3/uL (ref 4.0–10.5)
nRBC: 0 % (ref 0.0–0.2)
nRBC: 0 /100{WBCs}

## 2023-06-15 MED ORDER — DEXAMETHASONE SODIUM PHOSPHATE 10 MG/ML IJ SOLN
10.0000 mg | Freq: Once | INTRAMUSCULAR | Status: AC
  Administered 2023-06-15: 10 mg via INTRAVENOUS
  Filled 2023-06-15: qty 1

## 2023-06-15 MED ORDER — ACETAMINOPHEN 325 MG PO TABS
650.0000 mg | ORAL_TABLET | Freq: Once | ORAL | Status: AC
  Administered 2023-06-15: 650 mg via ORAL
  Filled 2023-06-15: qty 2

## 2023-06-15 MED ORDER — FAM-TRASTUZUMAB DERUXTECAN-NXKI CHEMO 100 MG IV SOLR
4.0500 mg/kg | Freq: Once | INTRAVENOUS | Status: AC
  Administered 2023-06-15: 200 mg via INTRAVENOUS
  Filled 2023-06-15: qty 10

## 2023-06-15 MED ORDER — PALONOSETRON HCL INJECTION 0.25 MG/5ML
0.2500 mg | Freq: Once | INTRAVENOUS | Status: AC
  Administered 2023-06-15: 0.25 mg via INTRAVENOUS
  Filled 2023-06-15: qty 5

## 2023-06-15 MED ORDER — DEXTROSE 5 % IV SOLN: Freq: Once | INTRAVENOUS | Status: AC

## 2023-06-15 MED ORDER — DIPHENHYDRAMINE HCL 25 MG PO CAPS
25.0000 mg | ORAL_CAPSULE | Freq: Once | ORAL | Status: AC
  Administered 2023-06-15: 25 mg via ORAL
  Filled 2023-06-15: qty 1

## 2023-06-15 MED ORDER — HEPARIN SOD (PORK) LOCK FLUSH 100 UNIT/ML IV SOLN
500.0000 [IU] | Freq: Once | INTRAVENOUS | Status: AC | PRN
  Administered 2023-06-15: 500 [IU]

## 2023-06-15 MED ORDER — SODIUM CHLORIDE 0.9% FLUSH
10.0000 mL | INTRAVENOUS | Status: DC | PRN
  Administered 2023-06-15: 10 mL

## 2023-06-15 NOTE — Assessment & Plan Note (Signed)
 Numerous heterogeneously enhancing, internally necrotic lesions in the right hepatic lobe CT in December 2023, which was consistent with metastatic disease. Largest lesion measures up to 6.7 cm and there are at least 6 lesions.  Liver biopsy was consistent with metastatic breast cancer with negative estrogen and progesterone receptors and positive HER2 receptor. She was initially treated with trastuzumab  with an excellent response with significant decrease in the size of her liver lesions and sustained response. She later had progression of this disease in the chest wall and was switched to Enhertu .  CT chest, abdomen, and pelvis in March revealed stable subtle liver lesions with at least 1 lesion is no longer visualized.   CT chest, abdomen and pelvis in October revealed a decrease in the hepatic lesions with no new lesions seen.  She continues Enhertu  every 3 weeks without significant difficulty.  Although, she continues to complain bitterly of excessive tearing.  She questions whether or not she wants to continue with treatment.  Dr. Cornelius recommended repeat CT imaging in 6 months, but if the patient does not wish to continue treatment, I would recommend CT imaging again at this time.  The patient decided she would like to continue Enhertu , so she will proceed with a 25th cycle of Enhertu  today.  I will plan to see her back in 3 weeks with a CBC and comprehensive metabolic panel prior to 26th cycle.

## 2023-06-15 NOTE — Patient Instructions (Signed)
 Fam-Trastuzumab Deruxtecan Injection What is this medication? FAM-TRASTUZUMAB DERUXTECAN (fam-tras TOOZ eu mab DER ux TEE kan) treats some types of cancer. It works by blocking a protein that causes cancer cells to grow and multiply. This helps to slow or stop the spread of cancer cells. This medicine may be used for other purposes; ask your health care provider or pharmacist if you have questions. COMMON BRAND NAME(S): ENHERTU What should I tell my care team before I take this medication? They need to know if you have any of these conditions: Heart disease Heart failure Infection, especially a viral infection, such as chickenpox, cold sores, or herpes Liver disease Lung or breathing disease, such as asthma or COPD An unusual or allergic reaction to fam-trastuzumab deruxtecan, other medications, foods, dyes, or preservatives Pregnant or trying to get pregnant Breast-feeding How should I use this medication? This medication is injected into a vein. It is given by your care team in a hospital or clinic setting. A special MedGuide will be given to you before each treatment. Be sure to read this information carefully each time. Talk to your care team about the use of this medication in children. Special care may be needed. Overdosage: If you think you have taken too much of this medicine contact a poison control center or emergency room at once. NOTE: This medicine is only for you. Do not share this medicine with others. What if I miss a dose? It is important not to miss your dose. Call your care team if you are unable to keep an appointment. What may interact with this medication? Interactions are not expected. This list may not describe all possible interactions. Give your health care provider a list of all the medicines, herbs, non-prescription drugs, or dietary supplements you use. Also tell them if you smoke, drink alcohol, or use illegal drugs. Some items may interact with your  medicine. What should I watch for while using this medication? Visit your care team for regular checks on your progress. Tell your care team if your symptoms do not start to get better or if they get worse. Your condition will be monitored carefully while you are receiving this medication. Do not become pregnant while taking this medication or for 7 months after stopping it. Women should inform their care team if they wish to become pregnant or think they might be pregnant. Men should not father a child while taking this medication and for 4 months after stopping it. There is potential for serious side effects to an unborn child. Talk to your care team for more information. Do not breast-feed an infant while taking this medication or for 7 months after the last dose. This medication has caused decreased sperm counts in some men. This may make it more difficult to father a child. Talk to your care team if you are concerned about your fertility. This medication may increase your risk to bruise or bleed. Call your care team if you notice any unusual bleeding. Be careful brushing or flossing your teeth or using a toothpick because you may get an infection or bleed more easily. If you have any dental work done, tell your dentist you are receiving this medication. This medication may cause dry eyes and blurred vision. If you wear contact lenses, you may feel some discomfort. Lubricating eye drops may help. See your care team if the problem does not go away or is severe. This medication may increase your risk of getting an infection. Call your care team for  advice if you get a fever, chills, sore throat, or other symptoms of a cold or flu. Do not treat yourself. Try to avoid being around people who are sick. Avoid taking medications that contain aspirin, acetaminophen, ibuprofen, naproxen, or ketoprofen unless instructed by your care team. These medications may hide a fever. What side effects may I notice from  receiving this medication? Side effects that you should report to your care team as soon as possible: Allergic reactions--skin rash, itching, hives, swelling of the face, lips, tongue, or throat Dry cough, shortness of breath or trouble breathing Infection--fever, chills, cough, sore throat, wounds that don't heal, pain or trouble when passing urine, general feeling of discomfort or being unwell Heart failure--shortness of breath, swelling of the ankles, feet, or hands, sudden weight gain, unusual weakness or fatigue Unusual bruising or bleeding Side effects that usually do not require medical attention (report these to your care team if they continue or are bothersome): Constipation Diarrhea Hair loss Muscle pain Nausea Vomiting This list may not describe all possible side effects. Call your doctor for medical advice about side effects. You may report side effects to FDA at 1-800-FDA-1088. Where should I keep my medication? This medication is given in a hospital or clinic. It will not be stored at home. NOTE: This sheet is a summary. It may not cover all possible information. If you have questions about this medicine, talk to your doctor, pharmacist, or health care provider.  2024 Elsevier/Gold Standard (2021-02-10 00:00:00)

## 2023-06-15 NOTE — Assessment & Plan Note (Signed)
 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  Biopsy revealed carcinoma consistent with her previous breast cancer with negative estrogen and progesterone receptors and positive HER2 receptor.  She was initially treated with docetaxel /trastuzumab  and received 10 cycles with a good response.  She then had recurrence in the left chest wall in September 2023, so was switched to Enhertu  good response.    CT imaging in March revealed stable extensive sclerotic bone metastase with a stable compression of L2 and T6, as well as stable to improved liver metastasis.  She does not have evidence of recurrent chest wall lesions.  Most recent CT chest, abdomen and pelvis in October revealed a decrease in the hepatic metastasis and no new metastasis.  She will proceed with a 25th cycle of Enhertu  today.  We will see her back in 3 weeks with a CBC and comprehensive metabolic panel prior to day 26th cycle.

## 2023-06-15 NOTE — Progress Notes (Signed)
 [name]  [address] [place],  [place]  [phone]  Clinic Day:  06/15/2023  Referring physician: [name], MD  ASSESSMENT & PLAN:   Assessment & Plan: Malignant neoplasm metastatic to liver [facility]) Numerous heterogeneously enhancing, internally necrotic lesions in the right hepatic lobe CT in December 2023, which was consistent with metastatic disease. Largest lesion measures up to 6.7 cm and there are at least 6 lesions.  Liver biopsy was consistent with metastatic breast cancer with negative estrogen and progesterone receptors and positive HER2 receptor. She was initially treated with trastuzumab  with an excellent response with significant decrease in the size of her liver lesions and sustained response. She later had progression of this disease in the chest wall and was switched to Enhertu .  CT chest, abdomen, and pelvis in March revealed stable subtle liver lesions with at least 1 lesion is no longer visualized.   CT chest, abdomen and pelvis in October revealed Lewis decrease in the hepatic lesions with no new lesions seen.  She continues Enhertu  every 3 weeks without significant difficulty.  Although, she continues to complain bitterly of excessive tearing.  She questions whether or not she wants to continue with treatment.  Dr. Cornelius recommended repeat CT imaging in 6 months, but if the patient does not wish to continue treatment, I would recommend CT imaging again at this time.  The patient decided she would like to continue Enhertu , so she will proceed with Lewis 25th cycle of Enhertu  today.  I will plan to see her back in 3 weeks with Lewis CBC and comprehensive metabolic panel prior to 26th cycle.  Malignant neoplasm metastatic to bone [facility]) Bone metastases diagnosed in December 2022, for which she was placed on zoledronic  acid every 4 weeks. She is now receiving zoledronic  acid every 6 weeks to correspond with her chemotherapy.  She has borderline  hypercalcemia.  CT imaging in October did not reveal any evidence of progressive disease.  Chest wall recurrence of breast cancer, left ([name]) Left chest wall recurrence diagnosed in January 2023.  Biopsy revealed carcinoma consistent with her previous breast cancer with negative estrogen and progesterone receptors and positive HER2 receptor.  She was initially treated with docetaxel /trastuzumab  and received 10 cycles with Lewis good response.  She then had recurrence in the left chest wall in September 2023, so was switched to Enhertu  good response.    CT imaging in March revealed stable extensive sclerotic bone metastase with Lewis stable compression of L2 and T6, as well as stable to improved liver metastasis.  She does not have evidence of recurrent chest wall lesions.  Most recent CT chest, abdomen and pelvis in October revealed Lewis decrease in the hepatic metastasis and no new metastasis.  She will proceed with Lewis 25th cycle of Enhertu  today.  We will see her back in 3 weeks with Lewis CBC and comprehensive metabolic panel prior to day 26th cycle.  Polyneuropathy She has had chronic left hand pain, initially thought to be related to lymphedema.  Her lymphedema was controlled. She has decreased strength and muscle wasting.  She was referred to neurology for further evaluation.  EMG revealed polyneuropathy.  She has now seen orthopedics and had an MRI of the left brachial plexus that was unremarkable, but limited due to patient motion.  She had been on gabapentin  100 mg at bedtime, but discontinued that on her own. She saw Dr. [name] in neurology in October and underwent repeat EMG, which was  more abnormal. She states he referred her to occupational therapy.  She is now scheduled to see another specialist in [place].    The patient understands the plans discussed today and is in agreement with them.  She knows to contact our office if she develops concerns prior to her next appointment.   I provided 30 minutes of  face-to-face time during this encounter and > 50% was spent counseling as documented under my assessment and plan.    Jasmin Lewis Jasmin Klich, PA-C  [name] [place] [name] [address] [place] [address] Dept: [phone] Dept Fax: [phone]   [name].     CHIEF COMPLAINT:  CC: Recurrent HER2 receptor positive breast cancer  Current Treatment: Enhertu  every 3 weeks with zoledronic  acid every 6 weeks  HISTORY OF PRESENT ILLNESS:   Oncology History Overview Note  Cancer Staging Malignant neoplasm of upper-outer quadrant of left breast in female, estrogen receptor negative ([name]) Staging form: Breast, AJCC 8th Edition - Clinical stage from 10/11/2018: Stage IIB (cT2, cN1, cM0, G3, ER-, PR-, HER2+) - Signed by Lanny Callander, MD on 11/02/2018 - Clinical: No stage assigned - Unsigned    History of colon cancer, stage II  09/2005 Initial Diagnosis   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 Findings were Lewis 5.9 x 3.8 x 1.2 cm moderate to well-differentiated adenocarcinoma penetrating the muscular wall. Forty lymph nodes negative. No vascular or lymphatic invasion. Multiple diverticula with microabscess formation and inflammation. Carcinoma present in at least 1 diverticulum. Preop CEA 5.2 with lab normal range 0 to 2.5. Preop CT scan with no obvious additional pathology.    2007 -  Chemotherapy   She received oral Xeloda chemotherapy as an adjuvant. She declined treatment on Lewis clinical trial.    11/12/2011 Initial Diagnosis   [name], stage II   12/09/2011 Procedure   Followup colonoscopy done on 12/09/2011. She was found to have 2 polyps which were removed. Chronic diverticulosis.     Malignant neoplasm of upper-outer quadrant of left breast in female, estrogen  receptor negative ([name])  09/26/2018 Mammogram   Mammogram/US  of left breast 09/26/18 IMPRESSION:  1. 2.9cm irregular mass in the upper outer left breast corresponds to the palpable abnormality. This is highly suspicious for breast carcinoma.  2. Two adjacent abnormal left axillary  Lymph nodes suspicious for metastatic adenopathy. There is Lewis third borderline abnormal left axillary LN with Lewis cortex thickened to 4mm.  3. Benign right breast cyst. No evidence of right breast malignancy.    10/11/2018 Cancer Staging   Staging form: Breast, AJCC 8th Edition - Clinical stage from 10/11/2018: Stage IIB (cT2, cN1, cM0, G3, ER-, PR-, HER2+) - Signed by Lanny Callander, MD on 11/02/2018   10/11/2018 Initial Biopsy   Diagnosis 10/11/18 1. Breast, left, needle core biopsy, upper outer left 2 o'clock - INVASIVE DUCTAL CARCINOMA. - DUCTAL CARCINOMA IN SITU. - LYMPHOVASCULAR INVASION IS IDENTIFIED. - SEE COMMENT. 2. Lymph node, needle/core biopsy, left axilla - INVASIVE DUCTAL CARCINOMA. - SEE COMMENT.   10/11/2018 Receptors her2   The tumor cells are POSITIVE for Her2 (3+). Estrogen Receptor: 0%, NEGATIVE Progesterone Receptor: 0%, NEGATIVE Proliferation Marker Ki67: 70%   11/02/2018 Initial Diagnosis   Malignant neoplasm of upper-outer quadrant of left breast in female, estrogen  receptor negative ([name])   11/15/2018 Breast MRI   MRI breast 11/15/18  IMPRESSION: 1. 3.3 centimeter mass in the LATERAL portion of the LEFT breast consistent with known malignancy. 2. There is significant non mass enhancement surrounding this mass and extending anteriorly into the nipple base, with largest diameter in the anterior to posterior axis, measuring 7.2 centimeters. 3. If the patient would consider breast conservation, additional MR guided core biopsies are recommended. Consider biopsy of the inferior and anterior extent of the non mass enhancement to document extent of disease. 4. Three enlarged LEFT axillary lymph  nodes. 5. RIGHT breast is negative.   11/15/2018 PET scan   PET 11/15/18 IMPRESSION: Hypermetabolic left breast lesion compatible with known primary. Hypermetabolic left axillary lymph nodes are consistent with metastatic disease.   No evidence for additional hypermetabolic metastatic involvement in the neck, chest, abdomen, or pelvis.   11/17/2018 - 03/01/2019 Chemotherapy   Neo-adjuvant Kadcyla  and perjeta  q3weeks for 6 cycles starting 11/17/18. Stopped before surgery    11/29/2018 Pathology Results   Diagnosis 1. Breast, left, needle core biopsy, inferior anterior (cylinder clip) - INVASIVE DUCTAL CARCINOMA. - DUCTAL CARCINOMA IN SITU. - LYMPHOVASCULAR INVASION IS IDENTIFIED. - SEE COMMENT. 2. Breast, left, needle core biopsy, central posterior (barbell clip) - INVASIVE DUCTAL CARCINOMA. - LYMPHOVASCULAR INVASION IS IDENTIFIED.   02/12/2019 Breast MRI   IMPRESSION: 1. Complete resolution of previously identified enhancing mass and associated non mass enhancement within the left breast. This is consistent with excellent response to chemotherapy. No residual or suspicious findings are identified. 2. No MRI evidence of malignancy on the right. 3. Previously identified left axillary lymphadenopathy not definitively seen on today's study. However, this may be due to decreased field-of-view compared to prior study.     03/14/2019 Cancer Staging   Staging form: Breast, AJCC 8th Edition - Pathologic stage from 03/14/2019: pT0, pN0, cM0, GX, ER: Unknown, PR: Unknown, HER2: Not Assessed - Signed by Lanny Callander, MD on 03/28/2019   03/14/2019 Surgery   LEFT MASTECTOMY WITH TARGETED LYMPH NODE DISSECTION and Left Axillary Sentinel Lymph  Node Biopsy by Dr Curvin 03/14/19    03/14/2019 Pathology Results   FINAL MICROSCOPIC DIAGNOSIS:   Lewis. LYMPH NODE, LEFT, SENTINEL, BIOPSY:  - There is no evidence of carcinoma in 1 of 1 lymph node (0/1).   B. BREAST, LEFT, MASTECTOMY:  - Benign breast  parenchyma with treatment-related changes.  - There is no evidence of malignancy.  - See oncology table below.   C. LYMPH NODE, LEFT #1, SENTINEL, BIOPSY:  - There is no evidence of carcinoma in 1 of 1 lymph node (0/1).   D. LYMPH NODE, LEFT #2, SENTINEL, BIOPSY:  - There is no evidence of carcinoma in 1 of 1 lymph node (0/1).     04/04/2019 - 04/25/2019 Chemotherapy   Maintenance Herceptin  injections every 3 weeks starting 04/03/19 to complete 1 year of treatment that was started in 11/2018. Stopped after 2nd dose as she will not be repeating Echos.    06/03/2021 - 06/24/2021 Chemotherapy   Patient is on Treatment Plan : BREAST Trastuzumab  q21d X 11 Cycles     06/25/2021 - 01/01/2022 Chemotherapy   Patient is on Treatment Plan : BREAST Docetaxel  + Trastuzumab  + Pertuzumab  (THP) q21d     06/25/2021 - 01/02/2022 Chemotherapy   Patient is on Treatment Plan : BREAST Docetaxel  + Trastuzumab  + Pertuzumab  (THP) q21d     09/14/2021 Imaging   CT chest/abdomen/pelvis  IMPRESSION:  1. Interval resolution of  previously seen bulky left axillary and  subpectoral lymphadenopathy. No persistently enlarged lymph nodes.  2. Multiple liver lesions are significantly diminished in size.  3. Widespread osseous metastatic disease, with an interval increase  in sclerosis of several previously lytic lesions.  4. Constellation of findings is consistent with treatment response  of metastatic disease  5. New, although age indeterminate pathologic wedge deformity of the  T6 vertebral body as well as an increased pathologic wedge deformity  of the L2 vertebral body.  6. Coronary artery disease.  Aortic Atherosclerosis (ICD10-I70.0).    01/26/2022 -  Chemotherapy   Patient is on Treatment Plan : BREAST METASTATIC Fam-Trastuzumab Deruxtecan-nxki  (Enhertu ) (5.4) q21d     Malignant neoplasm metastatic to liver ([name])  04/17/2021 Imaging   CT ABDOMEN AND PELVIS WITH CONTRAST: -New lytic lesion involving the L2  vertebral body (6:72, 2:26) with minimal cortical breakthrough superiorly, but with preservation of vertebral body height, concerning for metastatic disease.  -There are several indeterminate hepatic lesions as described above. In addition to the lesions described above, there is an additional subtle 1.5 cm hypodensity in the hepatic dome (2:7). Given clinical history and presence of Lewis new L2 lesion, leading differential consideration is metastatic disease. Recommend further evaluation with MRI of the abdomen with and without contrast.   05/12/2021 Imaging   MRI ABDOMEN WITH AND WITHOUT CONTRAST: Numerous heterogeneously enhancing, internally necrotic lesions in the right hepatic lobe, highly concerning for metastatic disease. Largest lesion measures up to 6.7 cm in hepatic segment VI.   Enhancing osseous lesions in the L2 vertebral body, both iliac bones, and in the sacrum, highly concerning for osseous metastases.   05/15/2021 Initial Diagnosis   Liver metastases ([name])   05/27/2021 PET scan   1. Widespread recurrent/metastatic disease in this patient who is  status post bilateral mastectomy. Left chest wall recurrence with  left axillary/subpectoral, supraclavicular nodal metastasis.  2. Hepatic, left adrenal, and widespread osseous metastasis.  3. Incidental findings, including: Right nephrolithiasis. Tiny  hiatal hernia.    05/27/2021 Imaging   MRI LUMBAR AND THORACIC SPINE: Thoracic spine:  Osseous metastatic disease involving each level. The most dramatic deposits are at T4 and T6 where there is extraosseous/epidural tumor. No cord compression but there is foraminal impingement on the left at T6-7 and on the right at T3-4. Early dorsal epidural tumor at the level of T10 and T3.   Lumbar spine:  1. Widespread osseous metastatic disease with largest deposit  replacing the L2 body where there is Lewis compression fracture with  mild height loss. Also at this level is early epidural tumor   extension likely affecting both L2-3 foramina.  2. 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    05/27/2021 Imaging   MRI HEAD WITH AND WITHOUT CONTRAST: Negative for metastatic disease to the brain or calvarium.   06/03/2021 - 06/24/2021 Chemotherapy   Patient is on Treatment Plan : BREAST Trastuzumab  q21d X 11 Cycles     06/25/2021 - 01/01/2022 Chemotherapy   Patient is on Treatment Plan : BREAST Docetaxel  + Trastuzumab  + Pertuzumab  (THP) q21d     06/25/2021 - 01/02/2022 Chemotherapy   Patient is on Treatment Plan : BREAST Docetaxel  + Trastuzumab  + Pertuzumab  (THP) q21d     09/14/2021 Imaging   CT chest/abdomen/pelvis  IMPRESSION:  1. Interval resolution of previously seen bulky left axillary and  subpectoral lymphadenopathy. No persistently enlarged lymph nodes.  2. Multiple liver lesions are significantly diminished in size.  3. Widespread  osseous metastatic disease, with an interval increase  in sclerosis of several previously lytic lesions.  4. Constellation of findings is consistent with treatment response  of metastatic disease  5. New, although age indeterminate pathologic wedge deformity of the  T6 vertebral body as well as an increased pathologic wedge deformity  of the L2 vertebral body.  6. Coronary artery disease.  Aortic Atherosclerosis (ICD10-I70.0).    01/26/2022 -  Chemotherapy   Patient is on Treatment Plan : BREAST METASTATIC Fam-Trastuzumab Deruxtecan-nxki  (Enhertu ) (5.4) q21d     Malignant neoplasm metastatic to bone ([name])  04/17/2021 Imaging   CT ABDOMEN AND PELVIS WITH CONTRAST: -New lytic lesion involving the L2 vertebral body (6:72, 2:26) with minimal cortical breakthrough superiorly, but with preservation of vertebral body height, concerning for metastatic disease.  -There are several indeterminate hepatic lesions as described above. In addition to the lesions described above, there is an additional subtle 1.5 cm hypodensity in the  hepatic dome (2:7). Given clinical history and presence of Lewis new L2 lesion, leading differential consideration is metastatic disease. Recommend further evaluation with MRI of the abdomen with and without contrast.   05/12/2021 Imaging   MRI ABDOMEN WITH AND WITHOUT CONTRAST: Numerous heterogeneously enhancing, internally necrotic lesions in the right hepatic lobe, highly concerning for metastatic disease. Largest lesion measures up to 6.7 cm in hepatic segment VI.   Enhancing osseous lesions in the L2 vertebral body, both iliac bones, and in the sacrum, highly concerning for osseous metastases.   05/15/2021 Initial Diagnosis   Bone metastases ([name])   05/27/2021 PET scan   1. Widespread recurrent/metastatic disease in this patient who is  status post bilateral mastectomy. Left chest wall recurrence with  left axillary/subpectoral, supraclavicular nodal metastasis.  2. Hepatic, left adrenal, and widespread osseous metastasis.  3. Incidental findings, including: Right nephrolithiasis. Tiny  hiatal hernia.    05/27/2021 Imaging   MRI LUMBAR AND THORACIC SPINE: Thoracic spine:  Osseous metastatic disease involving each level. The most dramatic deposits are at T4 and T6 where there is extraosseous/epidural tumor. No cord compression but there is foraminal impingement on the left at T6-7 and on the right at T3-4. Early dorsal epidural tumor at the level of T10 and T3.   Lumbar spine:  1. Widespread osseous metastatic disease with largest deposit  replacing the L2 body where there is Lewis compression fracture with  mild height loss. Also at this level is early epidural tumor  extension likely affecting both L2-3 foramina.  2. 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    05/27/2021 Imaging   MRI HEAD WITH AND WITHOUT CONTRAST: Negative for metastatic disease to the brain or calvarium.   06/03/2021 - 06/24/2021 Chemotherapy   Patient is on Treatment Plan : BREAST  Trastuzumab  q21d X 11 Cycles     06/25/2021 - 01/01/2022 Chemotherapy   Patient is on Treatment Plan : BREAST Docetaxel  + Trastuzumab  + Pertuzumab  (THP) q21d     06/25/2021 - 01/02/2022 Chemotherapy   Patient is on Treatment Plan : BREAST Docetaxel  + Trastuzumab  + Pertuzumab  (THP) q21d     09/14/2021 Imaging   CT chest/abdomen/pelvis  IMPRESSION:  1. Interval resolution of previously seen bulky left axillary and  subpectoral lymphadenopathy. No persistently enlarged lymph nodes.  2. Multiple liver lesions are significantly diminished in size.  3. Widespread osseous metastatic disease, with an interval increase  in sclerosis of several previously lytic lesions.  4. Constellation of findings is consistent with treatment response  of  metastatic disease  5. New, although age indeterminate pathologic wedge deformity of the  T6 vertebral body as well as an increased pathologic wedge deformity  of the L2 vertebral body.  6. Coronary artery disease.  Aortic Atherosclerosis (ICD10-I70.0).    01/26/2022 -  Chemotherapy   Patient is on Treatment Plan : BREAST METASTATIC Fam-Trastuzumab Deruxtecan-nxki  (Enhertu ) (5.4) q21d         INTERVAL HISTORY:  Jasmin Lewis is here today for repeat clinical assessment prior to Lewis 25th cycle of Enhertu .  She complains bitterly of excessive tearing and matting of her eyes.  She is otherwise tolerating this without difficulty.  She is not sure she wishes to continue Enhertu .  Her son accompanies her today and asked about possibly spacing out her treatments to every 4 weeks.  He also asks about treatment with ivermectin.  As far as I know, there is not conclusive data on the effectiveness of ivermectin in cancer.  She denies fevers or chills. She denies pain. Her appetite is good. Her weight has decreased 2 pounds over last 3 weeks .  Recent echocardiogram was stable.  REVIEW OF SYSTEMS:  Review of Systems  Constitutional:  Negative for appetite change, chills, fatigue,  fever and unexpected weight change.  HENT:   Negative for lump/mass, mouth sores and sore throat.   Eyes:  Positive for eye problems (excessive tearing, matting of eyelids).  Respiratory:  Negative for cough and shortness of breath.   Cardiovascular:  Negative for chest pain and leg swelling.  Gastrointestinal:  Negative for abdominal pain, constipation, diarrhea, nausea and vomiting.  Endocrine: Negative for hot flashes.  Genitourinary:  Negative for difficulty urinating, dysuria, frequency and hematuria.   Musculoskeletal:  Negative for arthralgias, back pain and myalgias.  Skin:  Negative for rash.  Neurological:  Negative for dizziness and headaches.  Hematological:  Negative for adenopathy. Does not bruise/bleed easily.  Psychiatric/Behavioral:  Negative for depression and sleep disturbance. The patient is not nervous/anxious.      VITALS:  Blood pressure (!) 143/81, pulse 78, temperature 98.3 F (36.8 C), temperature source Oral, resp. rate 20, height 5' 0.25 (1.53 m), weight 109 lb (49.4 kg), SpO2 97%.  Wt Readings from Last 3 Encounters:  06/15/23 109 lb (49.4 kg)  05/25/23 111 lb 11.2 oz (50.7 kg)  05/02/23 110 lb 9.6 oz (50.2 kg)    Body mass index is 21.11 kg/m.  Performance status (ECOG): 1 - Symptomatic but completely ambulatory  PHYSICAL EXAM:  Physical Exam Vitals and nursing note reviewed.  Constitutional:      General: She is not in acute distress.    Appearance: Normal appearance.  HENT:     Head: Normocephalic and atraumatic.     Mouth/Throat:     Mouth: Mucous membranes are moist.     Pharynx: Oropharynx is clear. No oropharyngeal exudate or posterior oropharyngeal erythema.  Eyes:     General: No scleral icterus.    Extraocular Movements: Extraocular movements intact.     Conjunctiva/sclera: Conjunctivae normal.     Right eye: Right conjunctiva is not injected. No exudate.    Left eye: Left conjunctiva is not injected. No exudate.    Pupils: Pupils  are equal, round, and reactive to light.  Cardiovascular:     Rate and Rhythm: Normal rate and regular rhythm.     Heart sounds: Normal heart sounds. No murmur heard.    No friction rub. No gallop.  Pulmonary:     Effort: Pulmonary effort is normal.  Breath sounds: Normal breath sounds. No wheezing, rhonchi or rales.  Chest:  Breasts:    Right: Normal. No swelling, bleeding, inverted nipple, mass, nipple discharge, skin change or tenderness.     Left: Absent.     Comments: Chest wall is negative Abdominal:     General: There is no distension.     Palpations: Abdomen is soft. There is no hepatomegaly, splenomegaly or mass.     Tenderness: There is no abdominal tenderness.  Musculoskeletal:        General: Normal range of motion.     Cervical back: Normal range of motion and neck supple. No tenderness.     Right lower leg: No edema.     Left lower leg: No edema.  Lymphadenopathy:     Cervical: No cervical adenopathy.     Upper Body:     Right upper body: No supraclavicular or axillary adenopathy.     Left upper body: No supraclavicular or axillary adenopathy.     Lower Body: No right inguinal adenopathy. No left inguinal adenopathy.  Skin:    General: Skin is warm and dry.     Coloration: Skin is not jaundiced.     Findings: No rash.  Neurological:     Mental Status: She is alert and oriented to person, place, and time.     Cranial Nerves: No cranial nerve deficit.     Motor: Weakness (right hand) and atrophy (right hand) present.  Psychiatric:        Mood and Affect: Mood normal.        Behavior: Behavior normal.        Thought Content: Thought content normal.     LABS:      Latest Ref Rng & Units 06/15/2023   12:57 PM 05/25/2023   12:56 PM 05/02/2023    1:02 PM  CBC  WBC 4.0 - 10.5 K/uL 5.5  5.1  5.9   Hemoglobin 12.0 - 15.0 g/dL 86.5  86.8  86.7   Hematocrit 36.0 - 46.0 % 38.2  37.2  38.0   Platelets 150 - 400 K/uL 181  151  193       Latest Ref Rng & Units  06/15/2023   12:57 PM 05/25/2023   12:56 PM 05/02/2023    1:02 PM  CMP  Glucose 70 - 99 mg/dL 95  98  89   BUN 8 - 23 mg/dL 23  23  17    Creatinine 0.44 - 1.00 mg/dL 9.22  9.19  9.24   Sodium 135 - 145 mmol/L 141  142  140   Potassium 3.5 - 5.1 mmol/L 4.3  4.3  4.4   Chloride 98 - 111 mmol/L 106  106  103   CO2 22 - 32 mmol/L 27  27  25    Calcium 8.9 - 10.3 mg/dL 89.6  89.7  89.2   Total Protein 6.5 - 8.1 g/dL 6.8  6.9  7.1   Total Bilirubin 0.0 - 1.2 mg/dL 0.4  0.4  0.6   Alkaline Phos 38 - 126 U/L 70  70  68   AST 15 - 41 U/L 39  38  39   ALT 0 - 44 U/L 19  20  20       Lab Results  Component Value Date   CEA1 1.8 06/03/2021   CEA 0.5 11/13/2012   /  CEA  Date Value Ref Range Status  06/03/2021 1.8 0.0 - 4.7 ng/mL Final    Comment:    (  NOTE)                             Nonsmokers          <3.9                             Smokers             <5.6 [employer] Electrochemiluminescence Immunoassay (ECLIA) Values obtained with different assay methods or kits cannot be used interchangeably.  Results cannot be interpreted as absolute evidence of the presence or absence of malignant disease. Performed At: [facility] [address] [place], [place] [id number] Jennette Shorter MD Ey:[id number]   11/13/2012 0.5 0.0 - 5.0 ng/mL Final    Lab Results  Component Value Date   LDH 186 11/12/2011   LDH 187 09/08/2010   LDH 171 09/09/2009    STUDIES:  ECHOCARDIOGRAM COMPLETE Result Date: 05/24/2023    ECHOCARDIOGRAM REPORT   Patient Name:   MELEANA COMMERFORD Date of Exam: 05/24/2023 Medical Rec #:  [id number]         Height:       60.2 in Accession #:    [id number]        Weight:       110.6 lb Date of Birth:  May 09, 1932         BSA:          1.455 m Patient Age:    89 years          BP:           160/87 mmHg Patient Gender: F                 HR:           78 bpm. Exam Location:  [facility] Procedure: 2D Echo, Cardiac Doppler, Color Doppler and Strain Analysis Indications:     Secondary malignant neoplasm of bone and bone marrow ([name])                 [C79.51, C79.52 (ICD-10-CM)]; Poisoning by antineoplastic and                 immunosuppressive drugs, undetermined, sequela [T45.1X4S                 (ICD-10-CM)]  History:        Patient has prior history of Echocardiogram examinations, most                 recent 10/21/2022.  Sonographer:    Charlie Jointer RDCS Referring Phys: [id number] CHRISTINE H MCCARTY IMPRESSIONS  1. Left ventricular ejection fraction, by estimation, is 60 to 65%. The left ventricle has normal function. The left ventricle has no regional wall motion abnormalities. There is mild left ventricular hypertrophy. Left ventricular diastolic parameters were normal. The average left ventricular global longitudinal strain is -12.3 %.  2. Right ventricular systolic function is normal. The right ventricular size is normal. There is mildly elevated pulmonary artery systolic pressure. The estimated right ventricular systolic pressure is 35.7 mmHg.  3. The mitral valve is degenerative. Trace to mild mitral valve regurgitation. No evidence of mitral stenosis. Moderate mitral annular calcification.  4. The aortic valve is normal in structure. Aortic valve regurgitation is mild. No aortic stenosis is present.  5. There is Moderate (Grade III) protruding plaque involving the ascending aorta.  6. The inferior vena cava  is normal in size with greater than 50% respiratory variability, suggesting right atrial pressure of 3 mmHg. Comparison(s): In comparison prior echocardiogram report from 03/24/2022 reported LVEF 60 to 65%, mild aortic insufficiency. MR was not reported, but appears trace on the image review. FINDINGS  Left Ventricle: Left ventricular ejection fraction, by estimation, is 60 to 65%. The left ventricle has normal function. The left ventricle has no regional wall motion abnormalities. The average left ventricular global longitudinal strain is -12.3 %. The left ventricular  internal cavity size was normal in size. There is mild left ventricular hypertrophy. Left ventricular diastolic parameters were normal. Right Ventricle: The right ventricular size is normal. Right vetricular wall thickness was not well visualized. Right ventricular systolic function is normal. There is mildly elevated pulmonary artery systolic pressure. The tricuspid regurgitant velocity  is 2.86 m/s, and with an assumed right atrial pressure of 3 mmHg, the estimated right ventricular systolic pressure is 35.7 mmHg. Left Atrium: Left atrial size was normal in size. Right Atrium: Right atrial size was normal in size. Pericardium: There is no evidence of pericardial effusion. Mitral Valve: The mitral valve is degenerative in appearance. There is mild calcification of the mitral valve leaflet(s). Moderate mitral annular calcification. Trace to mild mitral valve regurgitation. No evidence of mitral valve stenosis. Tricuspid Valve: The tricuspid valve is grossly normal. Tricuspid valve regurgitation is mild . No evidence of tricuspid stenosis. Aortic Valve: The aortic valve is normal in structure. Aortic valve regurgitation is mild. Aortic regurgitation PHT measures 522 msec. No aortic stenosis is present. Aortic valve mean gradient measures 4.5 mmHg. Aortic valve peak gradient measures 8.7 mmHg. Aortic valve area, by VTI measures 1.66 cm. Pulmonic Valve: The pulmonic valve was not well visualized. Pulmonic valve regurgitation is not visualized. No evidence of pulmonic stenosis. Aorta: The aortic root and ascending aorta are structurally normal, with no evidence of dilitation. There is moderate (Grade III) protruding plaque involving the ascending aorta. Venous: The inferior vena cava is normal in size with greater than 50% respiratory variability, suggesting right atrial pressure of 3 mmHg. IAS/Shunts: The interatrial septum was not well visualized.  LEFT VENTRICLE PLAX 2D LVIDd:         3.70 cm   Diastology LVIDs:          2.40 cm   LV e' medial:    7.83 cm/s LV PW:         1.00 cm   LV E/e' medial:  9.1 LV IVS:        1.00 cm   LV e' lateral:   11.50 cm/s LVOT diam:     1.90 cm   LV E/e' lateral: 6.2 LV SV:         52 LV SV Index:   35        2D Longitudinal Strain LVOT Area:     2.84 cm  2D Strain GLS Avg:     -12.3 %  RIGHT VENTRICLE             IVC RV Basal diam:  3.80 cm     IVC diam: 1.10 cm RV Mid diam:    2.80 cm RV S prime:     21.10 cm/s TAPSE (M-mode): 3.1 cm LEFT ATRIUM             Index        RIGHT ATRIUM           Index LA diam:  2.90 cm 1.99 cm/m   RA Area:     14.70 cm LA Vol (A2C):   25.3 ml 17.38 ml/m  RA Volume:   32.70 ml  22.47 ml/m LA Vol (A4C):   30.4 ml 20.89 ml/m LA Biplane Vol: 27.1 ml 18.62 ml/m  AORTIC VALVE AV Area (Vmax):    1.94 cm AV Area (Vmean):   1.77 cm AV Area (VTI):     1.66 cm AV Vmax:           147.50 cm/s AV Vmean:          102.700 cm/s AV VTI:            0.312 m AV Peak Grad:      8.7 mmHg AV Mean Grad:      4.5 mmHg LVOT Vmax:         100.77 cm/s LVOT Vmean:        63.967 cm/s LVOT VTI:          0.182 m LVOT/AV VTI ratio: 0.58 AI PHT:            522 msec  AORTA Ao Root diam: 3.00 cm Ao Asc diam:  3.20 cm Ao Desc diam: 1.50 cm MITRAL VALVE                TRICUSPID VALVE MV Area (PHT): 3.88 cm     TR Peak grad:   32.7 mmHg MV Decel Time: 196 msec     TR Vmax:        286.00 cm/s MV E velocity: 71.30 cm/s MV Lewis velocity: 103.00 cm/s  SHUNTS MV E/Lewis ratio:  0.69         Systemic VTI:  0.18 m                             Systemic Diam: 1.90 cm Sreedhar reddy Madireddy Electronically signed by Alean reddy Madireddy Signature Date/Time: 05/24/2023/5:42:12 PM    Final       HISTORY:   Past Medical History:  Diagnosis Date   Arthritis    shoulder   Breast cancer ([name])    Cancer ([name])    left breast ca   Colon cancer ([name])    [name], stage II 11/12/2011   Sigmoid lesion 5.9 cm  40 nodes negative but focus of cancer in Lewis diverticum   Pre-op CEA 5.2 with lab  normal up to 2.5 resected 09/21/05   Xeloda adjuvant chemotherapy   Hypertension    Polyneuropathy 02/03/2023   Right shoulder pain 04/12/2023    Past Surgical History:  Procedure Laterality Date   COLONOSCOPY     HEMICOLECTOMY  2007   MASTECTOMY WITH AXILLARY LYMPH NODE DISSECTION Left 03/14/2019   Procedure: LEFT MASTECTOMY WITH TARGETED LYMPH NODE DISSECTION;  Surgeon: Curvin Deward MOULD, MD;  Location: [facility] OR;  Service: General;  Laterality: Left;   SENTINEL NODE BIOPSY Left 03/14/2019   Procedure: Left Axillary Sentinel Lymph  Node Biopsy;  Surgeon: Curvin Deward MOULD, MD;  Location[facility] OR;  Service: General;  Laterality: Left;   TONSILLECTOMY     WISDOM TOOTH EXTRACTION      Family History  Problem Relation Age of Onset   Cancer Maternal Aunt        colon cancer     Social History:  reports that she has never smoked. She has never used smokeless tobacco. She reports that she does not drink alcohol  and does not use drugs.The patient is accompanied by her son today.  Allergies: No Known Allergies  Current Medications: Current Outpatient Medications  Medication Sig Dispense Refill   Biotin 1 MG CAPS Take by mouth.     fish oil-omega-3 fatty acids 1000 MG capsule Take 1 g by mouth daily.     co-enzyme Q-10 30 MG capsule Take 100 mg by mouth daily.     gabapentin  (NEURONTIN ) 100 MG capsule Take 1 capsule (100 mg total) by mouth at bedtime. (Patient not taking: Reported on 05/02/2023) 30 capsule 5   magnesium citrate SOLN Take 0.5 Bottles by mouth as needed for severe constipation.     Multiple Vitamin (MULTIVITAMIN) capsule Take 1 capsule by mouth daily.     ondansetron  (ZOFRAN ) 4 MG tablet Take 1 tablet (4 mg total) by mouth every 8 (eight) hours as needed for nausea or vomiting. (Patient not taking: Reported on 05/02/2023) 20 tablet 2   polyethylene glycol (MIRALAX / GLYCOLAX) 17 g packet Take 17 g by mouth as needed. constipation     No current facility-administered medications for  this visit.   Facility-Administered Medications Ordered in Other Visits  Medication Dose Route Frequency Provider Last Rate Last Admin   sodium chloride  flush (NS) 0.9 % injection 10 mL  10 mL Intracatheter PRN [name], MD   10 mL at 06/15/23 1547

## 2023-06-15 NOTE — Assessment & Plan Note (Signed)
 She has had chronic left hand pain, initially thought to be related to lymphedema.  Her lymphedema was controlled. She has decreased strength and muscle wasting.  She was referred to neurology for further evaluation.  EMG revealed polyneuropathy.  She has now seen orthopedics and had an MRI of the left brachial plexus that was unremarkable, but limited due to patient motion.  She had been on gabapentin  100 mg at bedtime, but discontinued that on her own. She saw Dr. [name] in neurology in October and underwent repeat EMG, which was more abnormal. She states he referred her to occupational therapy.  She is now scheduled to see another specialist in [place].

## 2023-06-15 NOTE — Assessment & Plan Note (Signed)
 Bone metastases diagnosed in December 2022, for which she was placed on zoledronic  acid every 4 weeks. She is now receiving zoledronic  acid every 6 weeks to correspond with her chemotherapy.  She has borderline hypercalcemia.  CT imaging in October did not reveal any evidence of progressive disease.

## 2023-07-06 ENCOUNTER — Other Ambulatory Visit: Payer: Self-pay | Admitting: Oncology

## 2023-07-06 ENCOUNTER — Encounter: Payer: Self-pay | Admitting: Oncology

## 2023-07-06 ENCOUNTER — Inpatient Hospital Stay: Payer: Medicare Other

## 2023-07-06 ENCOUNTER — Inpatient Hospital Stay (HOSPITAL_BASED_OUTPATIENT_CLINIC_OR_DEPARTMENT_OTHER): Payer: Medicare Other | Admitting: Oncology

## 2023-07-06 VITALS — BP 149/72 | HR 80 | Temp 98.4°F | Resp 18 | Ht 60.25 in | Wt 111.6 lb

## 2023-07-06 DIAGNOSIS — C7951 Secondary malignant neoplasm of bone: Secondary | ICD-10-CM | POA: Diagnosis not present

## 2023-07-06 DIAGNOSIS — C50412 Malignant neoplasm of upper-outer quadrant of left female breast: Secondary | ICD-10-CM

## 2023-07-06 DIAGNOSIS — C787 Secondary malignant neoplasm of liver and intrahepatic bile duct: Secondary | ICD-10-CM

## 2023-07-06 DIAGNOSIS — Z171 Estrogen receptor negative status [ER-]: Secondary | ICD-10-CM

## 2023-07-06 DIAGNOSIS — Z5112 Encounter for antineoplastic immunotherapy: Secondary | ICD-10-CM | POA: Diagnosis not present

## 2023-07-06 LAB — CMP (CANCER CENTER ONLY)
ALT: 18 U/L (ref 0–44)
AST: 42 U/L — ABNORMAL HIGH (ref 15–41)
Albumin: 3.9 g/dL (ref 3.5–5.0)
Alkaline Phosphatase: 71 U/L (ref 38–126)
Anion gap: 9 (ref 5–15)
BUN: 23 mg/dL (ref 8–23)
CO2: 26 mmol/L (ref 22–32)
Calcium: 10 mg/dL (ref 8.9–10.3)
Chloride: 107 mmol/L (ref 98–111)
Creatinine: 0.83 mg/dL (ref 0.44–1.00)
GFR, Estimated: >60 mL/min (ref >60)
Glucose, Bld: 98 mg/dL (ref 70–99)
Potassium: 4.3 mmol/L (ref 3.5–5.1)
Sodium: 142 mmol/L (ref 135–145)
Total Bilirubin: 0.4 mg/dL (ref 0.0–1.2)
Total Protein: 6.6 g/dL (ref 6.5–8.1)

## 2023-07-06 LAB — CBC WITH DIFFERENTIAL (CANCER CENTER ONLY)
Abs Immature Granulocytes: 0.01 10*3/uL (ref 0.00–0.07)
Basophils Absolute: 0 10*3/uL (ref 0.0–0.1)
Basophils Relative: 1 %
Eosinophils Absolute: 0.1 10*3/uL (ref 0.0–0.5)
Eosinophils Relative: 2 %
HCT: 37.8 % (ref 36.0–46.0)
Hemoglobin: 13 g/dL (ref 12.0–15.0)
Immature Granulocytes: 0 %
Lymphocytes Relative: 35 %
Lymphs Abs: 1.6 10*3/uL (ref 0.7–4.0)
MCH: 35.5 pg — ABNORMAL HIGH (ref 26.0–34.0)
MCHC: 34.4 g/dL (ref 30.0–36.0)
MCV: 103.3 fL — ABNORMAL HIGH (ref 80.0–100.0)
Monocytes Absolute: 0.6 10*3/uL (ref 0.1–1.0)
Monocytes Relative: 13 %
Neutro Abs: 2.3 10*3/uL (ref 1.7–7.7)
Neutrophils Relative %: 49 %
Platelet Count: 175 10*3/uL (ref 150–400)
RBC: 3.66 MIL/uL — ABNORMAL LOW (ref 3.87–5.11)
RDW: 13.9 % (ref 11.5–15.5)
WBC Count: 4.7 10*3/uL (ref 4.0–10.5)
nRBC: 0 % (ref 0.0–0.2)
nRBC: 0 /100{WBCs}

## 2023-07-06 MED ORDER — HEPARIN SOD (PORK) LOCK FLUSH 100 UNIT/ML IV SOLN
500.0000 [IU] | Freq: Once | INTRAVENOUS | Status: AC | PRN
  Administered 2023-07-06: 500 [IU]

## 2023-07-06 MED ORDER — FAM-TRASTUZUMAB DERUXTECAN-NXKI CHEMO 100 MG IV SOLR
4.0500 mg/kg | Freq: Once | INTRAVENOUS | Status: AC
  Administered 2023-07-06: 200 mg via INTRAVENOUS
  Filled 2023-07-06: qty 10

## 2023-07-06 MED ORDER — ZOLEDRONIC ACID 4 MG/5ML IV CONC
3.0000 mg | Freq: Once | INTRAVENOUS | Status: AC
  Administered 2023-07-06: 3 mg via INTRAVENOUS
  Filled 2023-07-06: qty 3.75

## 2023-07-06 MED ORDER — DEXTROSE 5 % IV SOLN: Freq: Once | INTRAVENOUS | Status: AC

## 2023-07-06 MED ORDER — ACETAMINOPHEN 325 MG PO TABS
650.0000 mg | ORAL_TABLET | Freq: Once | ORAL | Status: AC
  Administered 2023-07-06: 650 mg via ORAL
  Filled 2023-07-06: qty 2

## 2023-07-06 MED ORDER — SODIUM CHLORIDE 0.9% FLUSH
10.0000 mL | INTRAVENOUS | Status: DC | PRN
Start: 2023-07-06 — End: 2023-07-06
  Administered 2023-07-06: 10 mL

## 2023-07-06 MED ORDER — DEXAMETHASONE SODIUM PHOSPHATE 10 MG/ML IJ SOLN
10.0000 mg | Freq: Once | INTRAMUSCULAR | Status: AC
  Administered 2023-07-06: 10 mg via INTRAVENOUS
  Filled 2023-07-06: qty 1

## 2023-07-06 MED ORDER — PALONOSETRON HCL INJECTION 0.25 MG/5ML
0.2500 mg | Freq: Once | INTRAVENOUS | Status: AC
  Administered 2023-07-06: 0.25 mg via INTRAVENOUS
  Filled 2023-07-06: qty 5

## 2023-07-06 MED ORDER — DIPHENHYDRAMINE HCL 25 MG PO CAPS
25.0000 mg | ORAL_CAPSULE | Freq: Once | ORAL | Status: AC
  Administered 2023-07-06: 25 mg via ORAL
  Filled 2023-07-06: qty 1

## 2023-07-06 NOTE — Patient Instructions (Signed)
 Fam-Trastuzumab Deruxtecan Injection What is this medication? FAM-TRASTUZUMAB DERUXTECAN (fam-tras TOOZ eu mab DER ux TEE kan) treats some types of cancer. It works by blocking a protein that causes cancer cells to grow and multiply. This helps to slow or stop the spread of cancer cells. This medicine may be used for other purposes; ask your health care provider or pharmacist if you have questions. COMMON BRAND NAME(S): ENHERTU What should I tell my care team before I take this medication? They need to know if you have any of these conditions: Heart disease Heart failure Infection, especially a viral infection, such as chickenpox, cold sores, or herpes Liver disease Lung or breathing disease, such as asthma or COPD An unusual or allergic reaction to fam-trastuzumab deruxtecan, other medications, foods, dyes, or preservatives Pregnant or trying to get pregnant Breast-feeding How should I use this medication? This medication is injected into a vein. It is given by your care team in a hospital or clinic setting. A special MedGuide will be given to you before each treatment. Be sure to read this information carefully each time. Talk to your care team about the use of this medication in children. Special care may be needed. Overdosage: If you think you have taken too much of this medicine contact a poison control center or emergency room at once. NOTE: This medicine is only for you. Do not share this medicine with others. What if I miss a dose? It is important not to miss your dose. Call your care team if you are unable to keep an appointment. What may interact with this medication? Interactions are not expected. This list may not describe all possible interactions. Give your health care provider a list of all the medicines, herbs, non-prescription drugs, or dietary supplements you use. Also tell them if you smoke, drink alcohol, or use illegal drugs. Some items may interact with your  medicine. What should I watch for while using this medication? Visit your care team for regular checks on your progress. Tell your care team if your symptoms do not start to get better or if they get worse. This medication may increase your risk of getting an infection. Call your care team for advice if you get a fever, chills, sore throat, or other symptoms of a cold or flu. Do not treat yourself. Try to avoid being around people who are sick. Avoid taking medications that contain aspirin, acetaminophen, ibuprofen, naproxen, or ketoprofen unless instructed by your care team. These medications may hide a fever. Be careful brushing or flossing your teeth or using a toothpick because you may get an infection or bleed more easily. If you have any dental work done, tell your dentist you are receiving this medication. This medication may cause dry eyes and blurred vision. If you wear contact lenses, you may feel some discomfort. Lubricating eye drops may help. See your care team if the problem does not go away or is severe. Talk to your care team if you may be pregnant. Serious birth defects can occur if you take this medication during pregnancy and for 7 months after the last dose. If your partner can get pregnant, use a condom during sex while taking this medication and for 4 months after the last dose. Do not breastfeed while taking this medication and for 7 months after the last dose. This medication may cause infertility. Talk to your care team if you are concerned about your fertility. What side effects may I notice from receiving this medication? Side effects  that you should report to your care team as soon as possible: Allergic reactions--skin rash, itching, hives, swelling of the face, lips, tongue, or throat Dry cough, shortness of breath or trouble breathing Infection--fever, chills, cough, sore throat, wounds that don't heal, pain or trouble when passing urine, general feeling of discomfort or  being unwell Heart failure--shortness of breath, swelling of the ankles, feet, or hands, sudden weight gain, unusual weakness or fatigue Unusual bruising or bleeding Side effects that usually do not require medical attention (report these to your care team if they continue or are bothersome): Constipation Diarrhea Hair loss Muscle pain Nausea Vomiting This list may not describe all possible side effects. Call your doctor for medical advice about side effects. You may report side effects to FDA at 1-800-FDA-1088. Where should I keep my medication? This medication is given in a hospital or clinic. It will not be stored at home. NOTE: This sheet is a summary. It may not cover all possible information. If you have questions about this medicine, talk to your doctor, pharmacist, or health care provider.  2024 Elsevier/Gold Standard (2022-12-24 00:00:00)

## 2023-07-06 NOTE — Progress Notes (Signed)
 [facility]  [address] [place],  [place]  [address] [phone]  Clinic Day: 07/06/23  Referring physician: [name], MD  ASSESSMENT & PLAN:  Assessment: Malignant neoplasm metastatic to liver [facility]) Numerous heterogeneously enhancing, internally necrotic lesions in the right hepatic lobe CT in December 2023, which was consistent with metastatic disease. Largest lesion measures up to 6.7 cm and there are at least 6 lesions.  Liver biopsy was consistent with metastatic breast cancer with negative estrogen and progesterone receptors and positive HER2 receptor. Jasmin Lewis was initially treated with trastuzumab with an excellent response with significant decrease in the size of her liver lesions and sustained response. Jasmin Lewis later had progression of this disease in the chest wall and was switched to Enhertu.  CT chest, abdomen, and pelvis in March revealed stable subtle liver lesions with at least 1 lesion is no longer visualized. CT chest, abdomen and pelvis in October revealed a decrease in the hepatic lesions with no new lesions seen.  Jasmin Lewis continues Enhertu every 3 weeks without significant difficulty, although Jasmin Lewis continues to complain bitterly of excessive tearing.   Malignant neoplasm metastatic to bone [facility]) Bone metastases diagnosed in December 2022, for which Jasmin Lewis was placed on zoledronic acid every 4 weeks. Jasmin Lewis is now receiving zoledronic acid every 6 weeks to correspond with her chemotherapy.  Jasmin Lewis has borderline hypercalcemia.  CT imaging in October did not reveal any evidence of progressive disease.   Chest wall recurrence of breast cancer, left ([name]) Left chest wall recurrence diagnosed in January 2023.  Biopsy revealed carcinoma consistent with her previous breast cancer with negative estrogen and progesterone receptors and positive HER2 receptor.  Jasmin Lewis was initially treated with docetaxel/trastuzumab and received 10 cycles with a good response.  Jasmin Lewis then had recurrence  in the left chest wall in September 2023, so was switched to Enhertu with good response.     CT imaging in March, 2024 revealed stable extensive sclerotic bone metastase with a stable compression of L2 and T6, as well as stable to improved liver metastasis.  Jasmin Lewis does not have evidence of recurrent chest wall lesions.  Most recent CT chest, abdomen and pelvis in October revealed a decrease in the hepatic metastasis and no new metastasis.   Polyneuropathy Jasmin Lewis has had chronic left hand pain, initially thought to be related to lymphedema.  Her lymphedema was controlled. Jasmin Lewis has decreased strength and muscle wasting.  Jasmin Lewis was referred to neurology for further evaluation.  EMG revealed polyneuropathy.  Jasmin Lewis has now seen orthopedics and had an MRI of the left brachial plexus that was unremarkable, but limited due to patient motion.  Jasmin Lewis had been on gabapentin 100 mg at bedtime, but discontinued that on her own. Jasmin Lewis saw Dr. Dierdre Searles in neurology in October and underwent repeat EMG, which was more abnormal. Jasmin Lewis states he referred her to occupational therapy.  Jasmin Lewis is now scheduled to see another specialist in [place].   Plan: Jasmin Lewis will be due for repeat scans in early April. Her day 1 cycle 26 of Enhertu is scheduled on 07/06/2023. Jasmin Lewis has a WBC of 4.7, hemoglobin of 13.0, and platelet count of 175,000. Her CMP is normal other than a elevated AST of 42. I will see her back in 3 weeks with CBC and CMP. I will schedule repeat CT chest, abdomen, and pelvis in early April. The patient understands the plans discussed today and is in agreement with them.  Jasmin Lewis knows to contact our office if Jasmin Lewis develops  concerns prior to her next appointment.  I provided 11 minutes of face-to-face time during this encounter and > 50% was spent counseling as documented under my assessment and plan.   [name], MD  [name] [address] [place] [address] Dept: [phone] Dept Fax: [phone]   [name].   CHIEF COMPLAINT:  CC: Recurrent HER2 receptor positive breast cancer  Current Treatment: Enhertu every 3 weeks with zoledronic acid every 6 weeks  HISTORY OF PRESENT ILLNESS:   Oncology History Overview Note  Cancer Staging Malignant neoplasm of upper-outer quadrant of left breast in female, estrogen receptor negative ([name]) Staging form: Breast, AJCC 8th Edition - Clinical stage from 10/11/2018: Stage IIB (cT2, cN1, cM0, G3, ER-, PR-, HER2+) - Signed by Malachy Mood, MD on 11/02/2018 - Clinical: No stage assigned - Unsigned    History of colon cancer, stage II  09/2005 Initial Diagnosis   stage II adenocarcinoma of the sigmoid colon diagnosed in May 2007   09/21/2005 Surgery   Jasmin Lewis underwent surgical resection on 09/21/2005. Findings were a 5.9 x 3.8 x 1.2 cm moderate to well-differentiated adenocarcinoma penetrating the muscular wall. Forty lymph nodes negative. No vascular or lymphatic invasion. Multiple diverticula with microabscess formation and inflammation. Carcinoma present in at least 1 diverticulum. Preop CEA 5.2 with lab normal range 0 to 2.5. Preop CT scan with no obvious additional pathology.    2007 -  Chemotherapy   Jasmin Lewis received oral Xeloda chemotherapy as an adjuvant. Jasmin Lewis declined treatment on a clinical trial.    11/12/2011 Initial Diagnosis   [name], stage II   12/09/2011 Procedure   Followup colonoscopy done on 12/09/2011. Jasmin Lewis was found to have 2 polyps which were removed. Chronic diverticulosis.     Malignant neoplasm of upper-outer quadrant of left breast in female, estrogen receptor negative ([name])  09/26/2018 Mammogram   Mammogram/US of left breast 09/26/18 IMPRESSION:  1. 2.9cm irregular mass in the upper outer left breast corresponds to the palpable abnormality. This is highly suspicious for breast carcinoma.  2. Two adjacent  abnormal left axillary  Lymph nodes suspicious for metastatic adenopathy. There is a third borderline abnormal left axillary LN with a cortex thickened to 4mm.  3. Benign right breast cyst. No evidence of right breast malignancy.    10/11/2018 Cancer Staging   Staging form: Breast, AJCC 8th Edition - Clinical stage from 10/11/2018: Stage IIB (cT2, cN1, cM0, G3, ER-, PR-, HER2+) - Signed by Malachy Mood, MD on 11/02/2018   10/11/2018 Initial Biopsy   Diagnosis 10/11/18 1. Breast, left, needle core biopsy, upper outer left 2 o'clock - INVASIVE DUCTAL CARCINOMA. - DUCTAL CARCINOMA IN SITU. - LYMPHOVASCULAR INVASION IS IDENTIFIED. - SEE COMMENT. 2. Lymph node, needle/core biopsy, left axilla - INVASIVE DUCTAL CARCINOMA. - SEE COMMENT.   10/11/2018 Receptors her2   The tumor cells are POSITIVE for Her2 (3+). Estrogen Receptor: 0%, NEGATIVE Progesterone Receptor: 0%, NEGATIVE Proliferation Marker Ki67: 70%   11/02/2018 Initial Diagnosis   Malignant neoplasm of upper-outer quadrant of left breast in female, estrogen receptor negative ([name])   11/15/2018 Breast MRI   MRI breast 11/15/18  IMPRESSION: 1. 3.3 centimeter mass in the LATERAL portion of the LEFT breast consistent with known malignancy. 2. There is significant non mass enhancement surrounding this mass and extending anteriorly into the nipple base, with largest diameter  in the anterior to posterior axis, measuring 7.2 centimeters. 3. If the patient would consider breast conservation, additional MR guided core biopsies are recommended. Consider biopsy of the inferior and anterior extent of the non mass enhancement to document extent of disease. 4. Three enlarged LEFT axillary lymph nodes. 5. RIGHT breast is negative.   11/15/2018 PET scan   PET 11/15/18 IMPRESSION: Hypermetabolic left breast lesion compatible with known primary. Hypermetabolic left axillary lymph nodes are consistent with metastatic disease.   No evidence for additional  hypermetabolic metastatic involvement in the neck, chest, abdomen, or pelvis.   11/17/2018 - 03/01/2019 Chemotherapy   Neo-adjuvant Kadcyla and perjeta q3weeks for 6 cycles starting 11/17/18. Stopped before surgery    11/29/2018 Pathology Results   Diagnosis 1. Breast, left, needle core biopsy, inferior anterior (cylinder clip) - INVASIVE DUCTAL CARCINOMA. - DUCTAL CARCINOMA IN SITU. - LYMPHOVASCULAR INVASION IS IDENTIFIED. - SEE COMMENT. 2. Breast, left, needle core biopsy, central posterior (barbell clip) - INVASIVE DUCTAL CARCINOMA. - LYMPHOVASCULAR INVASION IS IDENTIFIED.   02/12/2019 Breast MRI   IMPRESSION: 1. Complete resolution of previously identified enhancing mass and associated non mass enhancement within the left breast. This is consistent with excellent response to chemotherapy. No residual or suspicious findings are identified. 2. No MRI evidence of malignancy on the right. 3. Previously identified left axillary lymphadenopathy not definitively seen on today's study. However, this may be due to decreased field-of-view compared to prior study.     03/14/2019 Cancer Staging   Staging form: Breast, AJCC 8th Edition - Pathologic stage from 03/14/2019: pT0, pN0, cM0, GX, ER: Unknown, PR: Unknown, HER2: Not Assessed - Signed by Malachy Mood, MD on 03/28/2019   03/14/2019 Surgery   LEFT MASTECTOMY WITH TARGETED LYMPH NODE DISSECTION and Left Axillary Sentinel Lymph  Node Biopsy by Dr Carolynne Edouard 03/14/19    03/14/2019 Pathology Results   FINAL MICROSCOPIC DIAGNOSIS:   A. LYMPH NODE, LEFT, SENTINEL, BIOPSY:  - There is no evidence of carcinoma in 1 of 1 lymph node (0/1).   B. BREAST, LEFT, MASTECTOMY:  - Benign breast parenchyma with treatment-related changes.  - There is no evidence of malignancy.  - See oncology table below.   C. LYMPH NODE, LEFT #1, SENTINEL, BIOPSY:  - There is no evidence of carcinoma in 1 of 1 lymph node (0/1).   D. LYMPH NODE, LEFT #2, SENTINEL, BIOPSY:   - There is no evidence of carcinoma in 1 of 1 lymph node (0/1).     04/04/2019 - 04/25/2019 Chemotherapy   Maintenance Herceptin injections every 3 weeks starting 04/03/19 to complete 1 year of treatment that was started in 11/2018. Stopped after 2nd dose as Jasmin Lewis will not be repeating Echos.    06/03/2021 - 06/24/2021 Chemotherapy   Patient is on Treatment Plan : BREAST Trastuzumab q21d X 11 Cycles     06/25/2021 - 01/01/2022 Chemotherapy   Patient is on Treatment Plan : BREAST Docetaxel + Trastuzumab + Pertuzumab (THP) q21d     06/25/2021 - 01/02/2022 Chemotherapy   Patient is on Treatment Plan : BREAST Docetaxel + Trastuzumab + Pertuzumab (THP) q21d     09/14/2021 Imaging   CT chest/abdomen/pelvis  IMPRESSION:  1. Interval resolution of previously seen bulky left axillary and  subpectoral lymphadenopathy. No persistently enlarged lymph nodes.  2. Multiple liver lesions are significantly diminished in size.  3. Widespread osseous metastatic disease, with an interval increase  in sclerosis of several previously lytic lesions.  4. Constellation of findings is consistent with treatment  response  of metastatic disease  5. New, although age indeterminate pathologic wedge deformity of the  T6 vertebral body as well as an increased pathologic wedge deformity  of the L2 vertebral body.  6. Coronary artery disease.  Aortic Atherosclerosis (ICD10-I70.0).    01/26/2022 -  Chemotherapy   Patient is on Treatment Plan : BREAST METASTATIC Fam-Trastuzumab Deruxtecan-nxki (Enhertu) (5.4) q21d     Malignant neoplasm metastatic to liver ([name])  04/17/2021 Imaging   CT ABDOMEN AND PELVIS WITH CONTRAST: -New lytic lesion involving the L2 vertebral body (6:72, 2:26) with minimal cortical breakthrough superiorly, but with preservation of vertebral body height, concerning for metastatic disease.  -There are several indeterminate hepatic lesions as described above. In addition to the lesions described above,  there is an additional subtle 1.5 cm hypodensity in the hepatic dome (2:7). Given clinical history and presence of a new L2 lesion, leading differential consideration is metastatic disease. Recommend further evaluation with MRI of the abdomen with and without contrast.   05/12/2021 Imaging   MRI ABDOMEN WITH AND WITHOUT CONTRAST: Numerous heterogeneously enhancing, internally necrotic lesions in the right hepatic lobe, highly concerning for metastatic disease. Largest lesion measures up to 6.7 cm in hepatic segment VI.   Enhancing osseous lesions in the L2 vertebral body, both iliac bones, and in the sacrum, highly concerning for osseous metastases.   05/15/2021 Initial Diagnosis   Liver metastases ([name])   05/27/2021 PET scan   1. Widespread recurrent/metastatic disease in this patient who is  status post bilateral mastectomy. Left chest wall recurrence with  left axillary/subpectoral, supraclavicular nodal metastasis.  2. Hepatic, left adrenal, and widespread osseous metastasis.  3. Incidental findings, including: Right nephrolithiasis. Tiny  hiatal hernia.    05/27/2021 Imaging   MRI LUMBAR AND THORACIC SPINE: Thoracic spine:  Osseous metastatic disease involving each level. The most dramatic deposits are at T4 and T6 where there is extraosseous/epidural tumor. No cord compression but there is foraminal impingement on the left at T6-7 and on the right at T3-4. Early dorsal epidural tumor at the level of T10 and T3.   Lumbar spine:  1. Widespread osseous metastatic disease with largest deposit  replacing the L2 body where there is a compression fracture with  mild height loss. Also at this level is early epidural tumor  extension likely affecting both L2-3 foramina.  2. 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    05/27/2021 Imaging   MRI HEAD WITH AND WITHOUT CONTRAST: Negative for metastatic disease to the brain or calvarium.   06/03/2021 - 06/24/2021  Chemotherapy   Patient is on Treatment Plan : BREAST Trastuzumab q21d X 11 Cycles     06/25/2021 - 01/01/2022 Chemotherapy   Patient is on Treatment Plan : BREAST Docetaxel + Trastuzumab + Pertuzumab (THP) q21d     06/25/2021 - 01/02/2022 Chemotherapy   Patient is on Treatment Plan : BREAST Docetaxel + Trastuzumab + Pertuzumab (THP) q21d     09/14/2021 Imaging   CT chest/abdomen/pelvis  IMPRESSION:  1. Interval resolution of previously seen bulky left axillary and  subpectoral lymphadenopathy. No persistently enlarged lymph nodes.  2. Multiple liver lesions are significantly diminished in size.  3. Widespread osseous metastatic disease, with an interval increase  in sclerosis of several previously lytic lesions.  4. Constellation of findings is consistent with treatment response  of metastatic disease  5. New, although age indeterminate pathologic wedge deformity of the  T6 vertebral body as well as an increased pathologic wedge  deformity  of the L2 vertebral body.  6. Coronary artery disease.  Aortic Atherosclerosis (ICD10-I70.0).    01/26/2022 -  Chemotherapy   Patient is on Treatment Plan : BREAST METASTATIC Fam-Trastuzumab Deruxtecan-nxki (Enhertu) (5.4) q21d     Malignant neoplasm metastatic to bone ([name])  04/17/2021 Imaging   CT ABDOMEN AND PELVIS WITH CONTRAST: -New lytic lesion involving the L2 vertebral body (6:72, 2:26) with minimal cortical breakthrough superiorly, but with preservation of vertebral body height, concerning for metastatic disease.  -There are several indeterminate hepatic lesions as described above. In addition to the lesions described above, there is an additional subtle 1.5 cm hypodensity in the hepatic dome (2:7). Given clinical history and presence of a new L2 lesion, leading differential consideration is metastatic disease. Recommend further evaluation with MRI of the abdomen with and without contrast.   05/12/2021 Imaging   MRI ABDOMEN WITH AND WITHOUT  CONTRAST: Numerous heterogeneously enhancing, internally necrotic lesions in the right hepatic lobe, highly concerning for metastatic disease. Largest lesion measures up to 6.7 cm in hepatic segment VI.   Enhancing osseous lesions in the L2 vertebral body, both iliac bones, and in the sacrum, highly concerning for osseous metastases.   05/15/2021 Initial Diagnosis   Bone metastases ([name])   05/27/2021 PET scan   1. Widespread recurrent/metastatic disease in this patient who is  status post bilateral mastectomy. Left chest wall recurrence with  left axillary/subpectoral, supraclavicular nodal metastasis.  2. Hepatic, left adrenal, and widespread osseous metastasis.  3. Incidental findings, including: Right nephrolithiasis. Tiny  hiatal hernia.    05/27/2021 Imaging   MRI LUMBAR AND THORACIC SPINE: Thoracic spine:  Osseous metastatic disease involving each level. The most dramatic deposits are at T4 and T6 where there is extraosseous/epidural tumor. No cord compression but there is foraminal impingement on the left at T6-7 and on the right at T3-4. Early dorsal epidural tumor at the level of T10 and T3.   Lumbar spine:  1. Widespread osseous metastatic disease with largest deposit  replacing the L2 body where there is a compression fracture with  mild height loss. Also at this level is early epidural tumor  extension likely affecting both L2-3 foramina.  2. 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    05/27/2021 Imaging   MRI HEAD WITH AND WITHOUT CONTRAST: Negative for metastatic disease to the brain or calvarium.   06/03/2021 - 06/24/2021 Chemotherapy   Patient is on Treatment Plan : BREAST Trastuzumab q21d X 11 Cycles     06/25/2021 - 01/01/2022 Chemotherapy   Patient is on Treatment Plan : BREAST Docetaxel + Trastuzumab + Pertuzumab (THP) q21d     06/25/2021 - 01/02/2022 Chemotherapy   Patient is on Treatment Plan : BREAST Docetaxel + Trastuzumab + Pertuzumab  (THP) q21d     09/14/2021 Imaging   CT chest/abdomen/pelvis  IMPRESSION:  1. Interval resolution of previously seen bulky left axillary and  subpectoral lymphadenopathy. No persistently enlarged lymph nodes.  2. Multiple liver lesions are significantly diminished in size.  3. Widespread osseous metastatic disease, with an interval increase  in sclerosis of several previously lytic lesions.  4. Constellation of findings is consistent with treatment response  of metastatic disease  5. New, although age indeterminate pathologic wedge deformity of the  T6 vertebral body as well as an increased pathologic wedge deformity  of the L2 vertebral body.  6. Coronary artery disease.  Aortic Atherosclerosis (ICD10-I70.0).    01/26/2022 -  Chemotherapy   Patient is  on Treatment Plan : BREAST METASTATIC Fam-Trastuzumab Deruxtecan-nxki (Enhertu) (5.4) q21d       INTERVAL HISTORY:  Jasmin Lewis is here today for repeat clinical assessment for her recurrent HER2 receptor positive breast cancer. Patient states that Jasmin Lewis feels ok but continues to complains of chronic tearing of the eye with blurred vision and severe left hand weakness. Jasmin Lewis will be due for repeat scans in April. Her day 1 cycle 26 of Enhertu is scheduled on 07/06/2023. Jasmin Lewis has a WBC of 4.7, hemoglobin of 13.0, and platelet count of 175,000. Her CMP is normal other than a elevated AST of 42. I will see her back in 3 weeks with CBC and CMP. I will schedule repeat CT chest, abdomen, and pelvis in early April. Jasmin Lewis denies signs of infection such as sore throat, sinus drainage, or urinary symptoms.  Jasmin Lewis denies fevers or recurrent chills. Jasmin Lewis denies pain. Jasmin Lewis denies nausea, vomiting, chest pain, dyspnea or cough. Her appetite is on and off and her weight has increased 2 pounds over last 3 weeks .   REVIEW OF SYSTEMS:  Review of Systems  Constitutional:  Negative for appetite change, chills, diaphoresis, fatigue, fever and unexpected weight change.  HENT:   Negative.  Negative for hearing loss, lump/mass, mouth sores, nosebleeds, sore throat, tinnitus, trouble swallowing and voice change.   Eyes:  Positive for eye problems (excessive tearing, matting of eyelids).  Respiratory: Negative.  Negative for chest tightness, cough, hemoptysis, shortness of breath and wheezing.   Cardiovascular: Negative.  Negative for chest pain, leg swelling and palpitations.  Gastrointestinal: Negative.  Negative for abdominal distention, abdominal pain, blood in stool, constipation, diarrhea, nausea, rectal pain and vomiting.  Endocrine: Negative.  Negative for hot flashes.  Genitourinary: Negative.  Negative for bladder incontinence, difficulty urinating, dyspareunia, dysuria, frequency, hematuria, menstrual problem, nocturia, pelvic pain, vaginal bleeding and vaginal discharge.   Musculoskeletal:  Negative for arthralgias, back pain, flank pain, gait problem, myalgias, neck pain and neck stiffness.       Mild lymphedema of the left arm  Skin: Negative.  Negative for itching, rash and wound.  Neurological:  Positive for numbness (severe left hand weakness). Negative for dizziness, extremity weakness, gait problem, headaches, light-headedness, seizures and speech difficulty.  Hematological: Negative.  Negative for adenopathy. Does not bruise/bleed easily.  Psychiatric/Behavioral: Negative.  Negative for confusion, decreased concentration, depression, sleep disturbance and suicidal ideas. The patient is not nervous/anxious.      VITALS:  Blood pressure (!) 149/72, pulse 80, temperature 98.4 F (36.9 C), temperature source Oral, resp. rate 18, height 5' 0.25" (1.53 m), weight 111 lb 9.6 oz (50.6 kg), SpO2 95%.  Wt Readings from Last 3 Encounters:  07/06/23 111 lb 9.6 oz (50.6 kg)  06/15/23 109 lb (49.4 kg)  05/25/23 111 lb 11.2 oz (50.7 kg)    Body mass index is 21.61 kg/m.  Performance status (ECOG): 1 - Symptomatic but completely ambulatory  PHYSICAL EXAM:   Physical Exam Vitals and nursing note reviewed.  Constitutional:      General: Jasmin Lewis is not in acute distress.    Appearance: Normal appearance. Jasmin Lewis is normal weight. Jasmin Lewis is not ill-appearing, toxic-appearing or diaphoretic.  HENT:     Head: Normocephalic and atraumatic.     Right Ear: Tympanic membrane, ear canal and external ear normal. There is no impacted cerumen.     Left Ear: Tympanic membrane, ear canal and external ear normal. There is no impacted cerumen.     Nose: Nose normal. No congestion  or rhinorrhea.     Mouth/Throat:     Mouth: Mucous membranes are moist.     Pharynx: Oropharynx is clear. No oropharyngeal exudate or posterior oropharyngeal erythema.  Eyes:     General: No scleral icterus.    Extraocular Movements: Extraocular movements intact.     Conjunctiva/sclera: Conjunctivae normal.     Right eye: Right conjunctiva is not injected. No exudate.    Left eye: Left conjunctiva is not injected. No exudate.    Pupils: Pupils are equal, round, and reactive to light.  Cardiovascular:     Rate and Rhythm: Normal rate and regular rhythm.     Pulses: Normal pulses.     Heart sounds: Normal heart sounds. No murmur heard.    No friction rub. No gallop.  Pulmonary:     Effort: Pulmonary effort is normal. No respiratory distress.     Breath sounds: Normal breath sounds. No stridor. No wheezing, rhonchi or rales.  Chest:     Chest wall: No tenderness.  Breasts:    Right: Normal. No swelling, bleeding, inverted nipple, mass, nipple discharge, skin change or tenderness.     Left: Absent.     Comments: Left mastectomy is negative Abdominal:     General: Bowel sounds are normal. There is no distension.     Palpations: Abdomen is soft. There is no hepatomegaly, splenomegaly or mass.     Tenderness: There is no abdominal tenderness.  Musculoskeletal:        General: Normal range of motion.     Cervical back: Normal range of motion and neck supple. No tenderness.     Right  lower leg: No edema.     Left lower leg: No edema.     Comments: Mild lymphedema of the left arm  Lymphadenopathy:     Cervical: No cervical adenopathy.     Upper Body:     Right upper body: No supraclavicular or axillary adenopathy.     Left upper body: No supraclavicular or axillary adenopathy.     Lower Body: No right inguinal adenopathy. No left inguinal adenopathy.  Skin:    General: Skin is warm and dry.     Coloration: Skin is not jaundiced.     Findings: No rash.  Neurological:     General: No focal deficit present.     Mental Status: Jasmin Lewis is alert and oriented to person, place, and time. Mental status is at baseline.     Cranial Nerves: No cranial nerve deficit.     Sensory: No sensory deficit.     Motor: Weakness (right hand) and atrophy (right hand) present.     Coordination: Coordination normal.     Gait: Gait normal.     Deep Tendon Reflexes: Reflexes normal.  Psychiatric:        Mood and Affect: Mood normal.        Behavior: Behavior normal.        Thought Content: Thought content normal.     LABS:      Latest Ref Rng & Units 07/06/2023    1:26 PM 06/15/2023   12:57 PM 05/25/2023   12:56 PM  CBC  WBC 4.0 - 10.5 K/uL 4.7  5.5  5.1   Hemoglobin 12.0 - 15.0 g/dL 52.8  41.3  24.4   Hematocrit 36.0 - 46.0 % 37.8  38.2  37.2   Platelets 150 - 400 K/uL 175  181  151       Latest Ref Rng & Units  07/06/2023    1:26 PM 06/15/2023   12:57 PM 05/25/2023   12:56 PM  CMP  Glucose 70 - 99 mg/dL 98  95  98   BUN 8 - 23 mg/dL 23  23  23    Creatinine 0.44 - 1.00 mg/dL 0.98  1.19  1.47   Sodium 135 - 145 mmol/L 142  141  142   Potassium 3.5 - 5.1 mmol/L 4.3  4.3  4.3   Chloride 98 - 111 mmol/L 107  106  106   CO2 22 - 32 mmol/L 26  27  27    Calcium 8.9 - 10.3 mg/dL 82.9  56.2  13.0   Total Protein 6.5 - 8.1 g/dL 6.6  6.8  6.9   Total Bilirubin 0.0 - 1.2 mg/dL 0.4  0.4  0.4   Alkaline Phos 38 - 126 U/L 71  70  70   AST 15 - 41 U/L 42  39  38   ALT 0 - 44 U/L 18  19  20      Lab Results  Component Value Date   CEA1 1.8 06/03/2021   CEA 0.5 11/13/2012   /  CEA  Date Value Ref Range Status  06/03/2021 1.8 0.0 - 4.7 ng/mL Final    Comment:    (NOTE)                             Nonsmokers          <3.9                             Smokers             <5.6 [employer] Electrochemiluminescence Immunoassay (ECLIA) Values obtained with different assay methods or kits cannot be used interchangeably.  Results cannot be interpreted as absolute evidence of the presence or absence of malignant disease. Performed At: [facility] [address] [place], [place] [id number] Jolene Schimke MD EX:[id number]   11/13/2012 0.5 0.0 - 5.0 ng/mL Final   Lab Results  Component Value Date   LDH 186 11/12/2011   LDH 187 09/08/2010   LDH 171 09/09/2009   STUDIES:  No results found.     HISTORY:   Past Medical History:  Diagnosis Date   Arthritis    shoulder   Breast cancer ([name])    Cancer ([name])    left breast ca   Colon cancer ([name])    [name], stage II 11/12/2011   Sigmoid lesion 5.9 cm  40 nodes negative but focus of cancer in a diverticum   Pre-op CEA 5.2 with lab normal up to 2.5 resected 09/21/05   Xeloda adjuvant chemotherapy   Hypertension    Polyneuropathy 02/03/2023   Right shoulder pain 04/12/2023    Past Surgical History:  Procedure Laterality Date   COLONOSCOPY     HEMICOLECTOMY  2007   MASTECTOMY WITH AXILLARY LYMPH NODE DISSECTION Left 03/14/2019   Procedure: LEFT MASTECTOMY WITH TARGETED LYMPH NODE DISSECTION;  Surgeon: Griselda Miner, MD;  Location: [facility] OR;  Service: General;  Laterality: Left;   SENTINEL NODE BIOPSY Left 03/14/2019   Procedure: Left Axillary Sentinel Lymph  Node Biopsy;  Surgeon: Griselda Miner, MD;  Location: [facility] OR;  Service: General;  Laterality: Left;   TONSILLECTOMY     WISDOM TOOTH EXTRACTION      Family History  Problem Relation Age  of Onset   Cancer Maternal Aunt        colon cancer      Social History:  reports that Jasmin Lewis has never smoked. Jasmin Lewis has never used smokeless tobacco. Jasmin Lewis reports that Jasmin Lewis does not drink alcohol and does not use drugs.The patient is accompanied by her son today.  Allergies: No Known Allergies  Current Medications: Current Outpatient Medications  Medication Sig Dispense Refill   Biotin 1 MG CAPS Take by mouth.     co-enzyme Q-10 30 MG capsule Take 100 mg by mouth daily.     fish oil-omega-3 fatty acids 1000 MG capsule Take 1 g by mouth daily.     gabapentin (NEURONTIN) 100 MG capsule Take 1 capsule (100 mg total) by mouth at bedtime. (Patient not taking: Reported on 05/02/2023) 30 capsule 5   magnesium citrate SOLN Take 0.5 Bottles by mouth as needed for severe constipation.     Multiple Vitamin (MULTIVITAMIN) capsule Take 1 capsule by mouth daily.     ondansetron (ZOFRAN) 4 MG tablet Take 1 tablet (4 mg total) by mouth every 8 (eight) hours as needed for nausea or vomiting. (Patient not taking: Reported on 05/02/2023) 20 tablet 2   polyethylene glycol (MIRALAX / GLYCOLAX) 17 g packet Take 17 g by mouth as needed. constipation     No current facility-administered medications for this visit.    I,Jasmine M Lassiter,acting as a scribe for [name], MD.,have documented all relevant documentation on the behalf of [name], MD,as directed by  [name], MD while in the presence of [name], MD.

## 2023-07-07 ENCOUNTER — Other Ambulatory Visit: Payer: Self-pay

## 2023-07-08 ENCOUNTER — Other Ambulatory Visit: Payer: Self-pay

## 2023-07-15 ENCOUNTER — Encounter: Payer: Self-pay | Admitting: Oncology

## 2023-07-21 ENCOUNTER — Other Ambulatory Visit: Payer: Self-pay

## 2023-07-27 ENCOUNTER — Telehealth: Payer: Self-pay | Admitting: Hematology and Oncology

## 2023-07-27 ENCOUNTER — Inpatient Hospital Stay (HOSPITAL_BASED_OUTPATIENT_CLINIC_OR_DEPARTMENT_OTHER): Payer: Medicare Other | Admitting: Hematology and Oncology

## 2023-07-27 ENCOUNTER — Inpatient Hospital Stay: Payer: Medicare Other | Attending: Hematology and Oncology

## 2023-07-27 ENCOUNTER — Inpatient Hospital Stay: Payer: Medicare Other

## 2023-07-27 ENCOUNTER — Encounter: Payer: Self-pay | Admitting: Hematology and Oncology

## 2023-07-27 VITALS — BP 152/77 | HR 74 | Temp 98.2°F | Resp 18 | Ht 60.25 in | Wt 112.7 lb

## 2023-07-27 DIAGNOSIS — C787 Secondary malignant neoplasm of liver and intrahepatic bile duct: Secondary | ICD-10-CM | POA: Insufficient documentation

## 2023-07-27 DIAGNOSIS — C50412 Malignant neoplasm of upper-outer quadrant of left female breast: Secondary | ICD-10-CM | POA: Insufficient documentation

## 2023-07-27 DIAGNOSIS — G629 Polyneuropathy, unspecified: Secondary | ICD-10-CM | POA: Diagnosis not present

## 2023-07-27 DIAGNOSIS — I972 Postmastectomy lymphedema syndrome: Secondary | ICD-10-CM | POA: Insufficient documentation

## 2023-07-27 DIAGNOSIS — C7951 Secondary malignant neoplasm of bone: Secondary | ICD-10-CM | POA: Insufficient documentation

## 2023-07-27 DIAGNOSIS — C50912 Malignant neoplasm of unspecified site of left female breast: Secondary | ICD-10-CM | POA: Diagnosis not present

## 2023-07-27 DIAGNOSIS — Z85038 Personal history of other malignant neoplasm of large intestine: Secondary | ICD-10-CM | POA: Diagnosis not present

## 2023-07-27 DIAGNOSIS — Z5112 Encounter for antineoplastic immunotherapy: Secondary | ICD-10-CM | POA: Insufficient documentation

## 2023-07-27 DIAGNOSIS — Z9012 Acquired absence of left breast and nipple: Secondary | ICD-10-CM | POA: Diagnosis not present

## 2023-07-27 DIAGNOSIS — Z800 Family history of malignant neoplasm of digestive organs: Secondary | ICD-10-CM | POA: Diagnosis not present

## 2023-07-27 DIAGNOSIS — C7989 Secondary malignant neoplasm of other specified sites: Secondary | ICD-10-CM

## 2023-07-27 DIAGNOSIS — M6250 Muscle wasting and atrophy, not elsewhere classified, unspecified site: Secondary | ICD-10-CM | POA: Diagnosis not present

## 2023-07-27 DIAGNOSIS — Z171 Estrogen receptor negative status [ER-]: Secondary | ICD-10-CM

## 2023-07-27 LAB — CBC WITH DIFFERENTIAL (CANCER CENTER ONLY)
Abs Immature Granulocytes: 0.01 10*3/uL (ref 0.00–0.07)
Basophils Absolute: 0 10*3/uL (ref 0.0–0.1)
Basophils Relative: 1 %
Eosinophils Absolute: 0.1 10*3/uL (ref 0.0–0.5)
Eosinophils Relative: 1 %
HCT: 37.9 % (ref 36.0–46.0)
Hemoglobin: 13.1 g/dL (ref 12.0–15.0)
Immature Granulocytes: 0 %
Immature Platelet Fraction: 1.6 % (ref 1.2–8.6)
Lymphocytes Relative: 35 %
Lymphs Abs: 1.6 10*3/uL (ref 0.7–4.0)
MCH: 35.8 pg — ABNORMAL HIGH (ref 26.0–34.0)
MCHC: 34.6 g/dL (ref 30.0–36.0)
MCV: 103.6 fL — ABNORMAL HIGH (ref 80.0–100.0)
Monocytes Absolute: 0.7 10*3/uL (ref 0.1–1.0)
Monocytes Relative: 15 %
Neutro Abs: 2.1 10*3/uL (ref 1.7–7.7)
Neutrophils Relative %: 48 %
Platelet Count: 169 10*3/uL (ref 150–400)
RBC: 3.66 MIL/uL — ABNORMAL LOW (ref 3.87–5.11)
RDW: 14 % (ref 11.5–15.5)
WBC Count: 4.5 10*3/uL (ref 4.0–10.5)
nRBC: 0 % (ref 0.0–0.2)
nRBC: 0 /100{WBCs}

## 2023-07-27 LAB — CMP (CANCER CENTER ONLY)
ALT: 17 U/L (ref 0–44)
AST: 41 U/L (ref 15–41)
Albumin: 4.1 g/dL (ref 3.5–5.0)
Alkaline Phosphatase: 68 U/L (ref 38–126)
Anion gap: 12 (ref 5–15)
BUN: 19 mg/dL (ref 8–23)
CO2: 26 mmol/L (ref 22–32)
Calcium: 10 mg/dL (ref 8.9–10.3)
Chloride: 106 mmol/L (ref 98–111)
Creatinine: 0.89 mg/dL (ref 0.44–1.00)
GFR, Estimated: >60 mL/min (ref >60)
Glucose, Bld: 104 mg/dL — ABNORMAL HIGH (ref 70–99)
Potassium: 4.3 mmol/L (ref 3.5–5.1)
Sodium: 144 mmol/L (ref 135–145)
Total Bilirubin: 0.4 mg/dL (ref 0.0–1.2)
Total Protein: 6.4 g/dL — ABNORMAL LOW (ref 6.5–8.1)

## 2023-07-27 MED ORDER — PALONOSETRON HCL INJECTION 0.25 MG/5ML
0.2500 mg | Freq: Once | INTRAVENOUS | Status: AC
  Administered 2023-07-27: 0.25 mg via INTRAVENOUS
  Filled 2023-07-27: qty 5

## 2023-07-27 MED ORDER — DEXAMETHASONE SODIUM PHOSPHATE 10 MG/ML IJ SOLN
10.0000 mg | Freq: Once | INTRAMUSCULAR | Status: AC
  Administered 2023-07-27: 10 mg via INTRAVENOUS
  Filled 2023-07-27: qty 1

## 2023-07-27 MED ORDER — SODIUM CHLORIDE 0.9% FLUSH
10.0000 mL | INTRAVENOUS | Status: DC | PRN
  Administered 2023-07-27: 10 mL

## 2023-07-27 MED ORDER — DEXTROSE 5 % IV SOLN: Freq: Once | INTRAVENOUS | Status: AC

## 2023-07-27 MED ORDER — FAM-TRASTUZUMAB DERUXTECAN-NXKI CHEMO 100 MG IV SOLR
4.0500 mg/kg | Freq: Once | INTRAVENOUS | Status: AC
  Administered 2023-07-27: 200 mg via INTRAVENOUS
  Filled 2023-07-27: qty 10

## 2023-07-27 MED ORDER — DIPHENHYDRAMINE HCL 25 MG PO CAPS
25.0000 mg | ORAL_CAPSULE | Freq: Once | ORAL | Status: AC
  Administered 2023-07-27: 25 mg via ORAL
  Filled 2023-07-27: qty 1

## 2023-07-27 MED ORDER — HEPARIN SOD (PORK) LOCK FLUSH 100 UNIT/ML IV SOLN
500.0000 [IU] | Freq: Once | INTRAVENOUS | Status: AC | PRN
  Administered 2023-07-27: 500 [IU]

## 2023-07-27 MED ORDER — ACETAMINOPHEN 325 MG PO TABS
650.0000 mg | ORAL_TABLET | Freq: Once | ORAL | Status: AC
  Administered 2023-07-27: 650 mg via ORAL
  Filled 2023-07-27: qty 2

## 2023-07-27 NOTE — Assessment & Plan Note (Signed)
 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  Biopsy revealed carcinoma consistent with her previous breast cancer with negative estrogen and progesterone receptors and positive HER2 receptor.  She was initially treated with docetaxel/trastuzumab and received 10 cycles with a good response.  She then had recurrence in the left chest wall in September 2023, so was switched to Enhertu good response.   Most recent CT chest, abdomen and pelvis in October 2024 revealed a decrease in the hepatic metastasis and no new metastasis.    She does not have recurrent chest wall lesions.  She will have repeat CT chest, pelvis in April.

## 2023-07-27 NOTE — Assessment & Plan Note (Addendum)
 She has had chronic left hand pain, initially thought to be related to lymphedema.  Her lymphedema was controlled. She has decreased strength and muscle wasting.  She was referred to neurology for further evaluation.  EMG revealed polyneuropathy.  She has now seen orthopedics and had an MRI of the left brachial plexus that was unremarkable, but limited due to patient motion.  She had been on gabapentin 100 mg at bedtime, but discontinued that on her own. She saw Dr. Dierdre Searles in neurology in October 2024 and underwent repeat EMG, which was more abnormal. She has been seen by Dr. Amanda Pea with [name] in [place], who did not feel surgery was a good option. The patient no longer sees occupational therapy, but does exercises at home.

## 2023-07-27 NOTE — Assessment & Plan Note (Signed)
 Numerous heterogeneously enhancing, internally necrotic lesions in the right hepatic lobe CT in December 2023, which was consistent with metastatic disease. Largest lesion measures up to 6.7 cm and there are at least 6 lesions.  Liver biopsy was consistent with metastatic breast cancer with negative estrogen and progesterone receptors and positive HER2 receptor. She was initially treated with trastuzumab with an excellent response with significant decrease in the size of her liver lesions and sustained response. She later had progression of this disease in the chest wall and was switched to Enhertu.  CT chest, abdomen, and pelvis in March revealed stable subtle liver lesions with at least 1 lesion is no longer visualized.   CT chest, abdomen and pelvis in October 2024 revealed a decrease in the hepatic lesions with no new lesions seen.  She continues Enhertu every 3 weeks without significant difficulty.  Although, she continues to complain bitterly of excessive tearing.  She questions whether or not she wants to continue with treatment.  Dr. Gilman Buttner recommended repeat CT imaging in 6 months, but if the patient does not wish to continue treatment, I would recommend CT imaging again at this time.  The patient decided she would like to continue Enhertu, so she will proceed with a 27th cycle of Enhertu today.  I will plan to see her back in 3 weeks with a CBC, comprehensive metabolic panel and CT chest, abdomen and pelvis to reassess her disease baseline prior to a 28th cycle.

## 2023-07-27 NOTE — Assessment & Plan Note (Signed)
 Bone metastases diagnosed in December 2022, for which she was placed on zoledronic acid every 4 weeks. She is now receiving zoledronic acid every 6 weeks to correspond with her chemotherapy.  She has borderline hypercalcemia.  CT imaging in October 2024 did not reveal any evidence of progressive disease.  She will have repeat CT imaging again in April.

## 2023-07-27 NOTE — Progress Notes (Signed)
 Jasmin Lewis  [address] [place],  [place]  [phone]  Clinic Day:  07/27/2023  Referring physician: Alfrieda Antes, MD  ASSESSMENT & PLAN:   Assessment & Plan: Malignant neoplasm metastatic to liver [facility]as[facility]) Numerous heterogeneously enhancing, internally necrotic lesions in the right hepatic lobe CT in December 2023, which was consistent with metastatic disease. Largest lesion measures up to 6.7 cm and there are at least 6 lesions.  Liver biopsy was consistent with metastatic breast cancer with negative estrogen and progesterone receptors and positive HER2 receptor. She was initially treated with trastuzumab with an excellent response with significant decrease in the size of her liver lesions and sustained response. She later had progression of this disease in the chest wall and was switched to Enhertu.  CT chest, abdomen, and pelvis in March revealed stable subtle liver lesions with at least 1 lesion is no longer visualized.   CT chest, abdomen and pelvis in October 2024 revealed a decrease in the hepatic lesions with no new lesions seen.  She continues Enhertu every 3 weeks without significant difficulty.  Although, she continues to complain bitterly of excessive tearing.  She questions whether or not she wants to continue with treatment.  Jasmin Lewis recommended repeat CT imaging in 6 months, but if the patient does not wish to continue treatment, I would recommend CT imaging again at this time.  The patient decided she would like to continue Enhertu, so she will proceed with a 27th cycle of Enhertu today.  I will plan to see her back in 3 weeks with a CBC, comprehensive metabolic panel and CT chest, abdomen and pelvis to reassess her disease baseline prior to a 28th cycle.  Malignant neoplasm metastatic to bone [facility]) Bone metastases diagnosed in December 2022, for which she was placed on zoledronic acid every 4 weeks. She is now receiving zoledronic acid every 6  weeks to correspond with her chemotherapy.  She has borderline hypercalcemia.  CT imaging in October 2024 did not reveal any evidence of progressive disease.  She will have repeat CT imaging again in April.  Chest wall recurrence of breast cancer, left ([name]) Left chest wall recurrence diagnosed in January 2023.  Biopsy revealed carcinoma consistent with her previous breast cancer with negative estrogen and progesterone receptors and positive HER2 receptor.  She was initially treated with docetaxel/trastuzumab and received 10 cycles with a good response.  She then had recurrence in the left chest wall in September 2023, so was switched to Enhertu good response.   Most recent CT chest, abdomen and pelvis in October 2024 revealed a decrease in the hepatic metastasis and no new metastasis.    She does not have recurrent chest wall lesions.  She will have repeat CT chest, pelvis in April.  Polyneuropathy She has had chronic left hand pain, initially thought to be related to lymphedema.  Her lymphedema was controlled. She has decreased strength and muscle wasting.  She was referred to neurology for further evaluation.  EMG revealed polyneuropathy.  She has now seen orthopedics and had an MRI of the left brachial plexus that was unremarkable, but limited due to patient motion.  She had been on gabapentin 100 mg at bedtime, but discontinued that on her own. She saw Dr. Eugene Hertz in neurology in October 2024 and underwent repeat EMG, which was more abnormal. She has been seen by Dr. Aloha Arnold with [name] in [place], who did not feel surgery was a good option. The patient no  longer sees occupational therapy, but does exercises at home.    The patient understands the plans discussed today and is in agreement with them.  She knows to contact our office if she develops concerns prior to her next appointment.   I provided 30 minutes of face-to-face time during this encounter and > 50% was spent counseling as  documented under my assessment and plan.    Jasmin Braxton A Raeann Offner, PA-C  [facility] - A DEPT OF [name]. [place] [facility] [address] [place] [address] Dept: [phone] Dept Fax: [phone]   [name].     CHIEF COMPLAINT:  CC: Recurrent HER2 positive breast cancer  Current Treatment: Enhertu every 3 weeks/zoledronic acid every 6 weeks  HISTORY OF PRESENT ILLNESS:   Oncology History Overview Note  Cancer Staging Malignant neoplasm of upper-outer quadrant of left breast in female, estrogen receptor negative ([name]) Staging form: Breast, AJCC 8th Edition - Clinical stage from 10/11/2018: Stage IIB (cT2, cN1, cM0, G3, ER-, PR-, HER2+) - Signed by Sonja Owasso, MD on 11/02/2018 - Clinical: No stage assigned - Unsigned    History of colon cancer, stage II  09/2005 Initial Diagnosis   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 Findings were a 5.9 x 3.8 x 1.2 cm moderate to well-differentiated adenocarcinoma penetrating the muscular wall. Forty lymph nodes negative. No vascular or lymphatic invasion. Multiple diverticula with microabscess formation and inflammation. Carcinoma present in at least 1 diverticulum. Preop CEA 5.2 with lab normal range 0 to 2.5. Preop CT scan with no obvious additional pathology.    2007 -  Chemotherapy   She received oral Xeloda chemotherapy as an adjuvant. She declined treatment on a clinical trial.    11/12/2011 Initial Diagnosis   [name], stage II   12/09/2011 Procedure   Followup colonoscopy done on 12/09/2011. She was found to have 2 polyps which were removed. Chronic diverticulosis.     Malignant neoplasm of upper-outer quadrant of left breast in female, estrogen receptor negative ([name])  09/26/2018 Mammogram   Mammogram/US  of left breast  09/26/18 IMPRESSION:  1. 2.9cm irregular mass in the upper outer left breast corresponds to the palpable abnormality. This is highly suspicious for breast carcinoma.  2. Two adjacent abnormal left axillary  Lymph nodes suspicious for metastatic adenopathy. There is a third borderline abnormal left axillary LN with a cortex thickened to 4mm.  3. Benign right breast cyst. No evidence of right breast malignancy.    10/11/2018 Cancer Staging   Staging form: Breast, AJCC 8th Edition - Clinical stage from 10/11/2018: Stage IIB (cT2, cN1, cM0, G3, ER-, PR-, HER2+) - Signed by Sonja , MD on 11/02/2018   10/11/2018 Initial Biopsy   Diagnosis 10/11/18 1. Breast, left, needle core biopsy, upper outer left 2 o'clock - INVASIVE DUCTAL CARCINOMA. - DUCTAL CARCINOMA IN SITU. - LYMPHOVASCULAR INVASION IS IDENTIFIED. - SEE COMMENT. 2. Lymph node, needle/core biopsy, left axilla - INVASIVE DUCTAL CARCINOMA. - SEE COMMENT.   10/11/2018 Receptors her2   The tumor cells are POSITIVE for Her2 (3+). Estrogen Receptor: 0%, NEGATIVE Progesterone Receptor: 0%, NEGATIVE Proliferation Marker Ki67: 70%   11/02/2018 Initial Diagnosis   Malignant neoplasm of upper-outer quadrant of left breast in female, estrogen receptor negative ([name])   11/15/2018 Breast MRI   MRI breast 11/15/18  IMPRESSION:  1. 3.3 centimeter mass in the LATERAL portion of the LEFT breast consistent with known malignancy. 2. There is significant non mass enhancement surrounding this mass and extending anteriorly into the nipple base, with largest diameter in the anterior to posterior axis, measuring 7.2 centimeters. 3. If the patient would consider breast conservation, additional MR guided core biopsies are recommended. Consider biopsy of the inferior and anterior extent of the non mass enhancement to document extent of disease. 4. Three enlarged LEFT axillary lymph nodes. 5. RIGHT breast is negative.   11/15/2018 PET scan   PET  11/15/18 IMPRESSION: Hypermetabolic left breast lesion compatible with known primary. Hypermetabolic left axillary lymph nodes are consistent with metastatic disease.   No evidence for additional hypermetabolic metastatic involvement in the neck, chest, abdomen, or pelvis.   11/17/2018 - 03/01/2019 Chemotherapy   Neo-adjuvant Kadcyla and perjeta q3weeks for 6 cycles starting 11/17/18. Stopped before surgery    11/29/2018 Pathology Results   Diagnosis 1. Breast, left, needle core biopsy, inferior anterior (cylinder clip) - INVASIVE DUCTAL CARCINOMA. - DUCTAL CARCINOMA IN SITU. - LYMPHOVASCULAR INVASION IS IDENTIFIED. - SEE COMMENT. 2. Breast, left, needle core biopsy, central posterior (barbell clip) - INVASIVE DUCTAL CARCINOMA. - LYMPHOVASCULAR INVASION IS IDENTIFIED.   02/12/2019 Breast MRI   IMPRESSION: 1. Complete resolution of previously identified enhancing mass and associated non mass enhancement within the left breast. This is consistent with excellent response to chemotherapy. No residual or suspicious findings are identified. 2. No MRI evidence of malignancy on the right. 3. Previously identified left axillary lymphadenopathy not definitively seen on today's study. However, this may be due to decreased field-of-view compared to prior study.     03/14/2019 Cancer Staging   Staging form: Breast, AJCC 8th Edition - Pathologic stage from 03/14/2019: pT0, pN0, cM0, GX, ER: Unknown, PR: Unknown, HER2: Not Assessed - Signed by Sonja [place], MD on 03/28/2019   03/14/2019 Surgery   LEFT MASTECTOMY WITH TARGETED LYMPH NODE DISSECTION and Left Axillary Sentinel Lymph  Node Biopsy by Dr Alethea Andes 03/14/19    03/14/2019 Pathology Results   FINAL MICROSCOPIC DIAGNOSIS:   A. LYMPH NODE, LEFT, SENTINEL, BIOPSY:  - There is no evidence of carcinoma in 1 of 1 lymph node (0/1).   B. BREAST, LEFT, MASTECTOMY:  - Benign breast parenchyma with treatment-related changes.  - There is no evidence of  malignancy.  - See oncology table below.   C. LYMPH NODE, LEFT #1, SENTINEL, BIOPSY:  - There is no evidence of carcinoma in 1 of 1 lymph node (0/1).   D. LYMPH NODE, LEFT #2, SENTINEL, BIOPSY:  - There is no evidence of carcinoma in 1 of 1 lymph node (0/1).     04/04/2019 - 04/25/2019 Chemotherapy   Maintenance Herceptin injections every 3 weeks starting 04/03/19 to complete 1 year of treatment that was started in 11/2018. Stopped after 2nd dose as she will not be repeating Echos.    06/03/2021 - 06/24/2021 Chemotherapy   Patient is on Treatment Plan : BREAST Trastuzumab q21d X 11 Cycles     06/25/2021 - 01/01/2022 Chemotherapy   Patient is on Treatment Plan : BREAST Docetaxel + Trastuzumab + Pertuzumab (THP) q21d     06/25/2021 - 01/02/2022 Chemotherapy   Patient is on Treatment Plan : BREAST Docetaxel + Trastuzumab + Pertuzumab (THP) q21d     09/14/2021 Imaging   CT chest/abdomen/pelvis  IMPRESSION:  1. Interval resolution of previously seen bulky left axillary and  subpectoral lymphadenopathy. No persistently enlarged lymph nodes.  2. Multiple liver lesions are significantly diminished in size.  3. Widespread osseous metastatic disease, with an interval increase  in sclerosis of several previously lytic lesions.  4. Constellation of findings is consistent with treatment response  of metastatic disease  5. New, although age indeterminate pathologic wedge deformity of the  T6 vertebral body as well as an increased pathologic wedge deformity  of the L2 vertebral body.  6. Coronary artery disease.  Aortic Atherosclerosis (ICD10-I70.0).    01/26/2022 -  Chemotherapy   Patient is on Treatment Plan : BREAST METASTATIC Fam-Trastuzumab Deruxtecan-nxki (Enhertu) (5.4) q21d     Malignant neoplasm metastatic to liver ([name])  04/17/2021 Imaging   CT ABDOMEN AND PELVIS WITH CONTRAST: -New lytic lesion involving the L2 vertebral body (6:72, 2:26) with minimal cortical breakthrough superiorly,  but with preservation of vertebral body height, concerning for metastatic disease.  -There are several indeterminate hepatic lesions as described above. In addition to the lesions described above, there is an additional subtle 1.5 cm hypodensity in the hepatic dome (2:7). Given clinical history and presence of a new L2 lesion, leading differential consideration is metastatic disease. Recommend further evaluation with MRI of the abdomen with and without contrast.   05/12/2021 Imaging   MRI ABDOMEN WITH AND WITHOUT CONTRAST: Numerous heterogeneously enhancing, internally necrotic lesions in the right hepatic lobe, highly concerning for metastatic disease. Largest lesion measures up to 6.7 cm in hepatic segment VI.   Enhancing osseous lesions in the L2 vertebral body, both iliac bones, and in the sacrum, highly concerning for osseous metastases.   05/15/2021 Initial Diagnosis   Liver metastases ([name])   05/27/2021 PET scan   1. Widespread recurrent/metastatic disease in this patient who is  status post bilateral mastectomy. Left chest wall recurrence with  left axillary/subpectoral, supraclavicular nodal metastasis.  2. Hepatic, left adrenal, and widespread osseous metastasis.  3. Incidental findings, including: Right nephrolithiasis. Tiny  hiatal hernia.    05/27/2021 Imaging   MRI LUMBAR AND THORACIC SPINE: Thoracic spine:  Osseous metastatic disease involving each level. The most dramatic deposits are at T4 and T6 where there is extraosseous/epidural tumor. No cord compression but there is foraminal impingement on the left at T6-7 and on the right at T3-4. Early dorsal epidural tumor at the level of T10 and T3.   Lumbar spine:  1. Widespread osseous metastatic disease with largest deposit  replacing the L2 body where there is a compression fracture with  mild height loss. Also at this level is early epidural tumor  extension likely affecting both L2-3 foramina.  2. Lumbar spine degeneration  with scoliosis and multilevel  impingement.    05/27/2021 Imaging   MRI HEAD WITH AND WITHOUT CONTRAST: Negative for metastatic disease to the brain or calvarium.   06/03/2021 - 06/24/2021 Chemotherapy   Patient is on Treatment Plan : BREAST Trastuzumab q21d X 11 Cycles     06/25/2021 - 01/01/2022 Chemotherapy   Patient is on Treatment Plan : BREAST Docetaxel + Trastuzumab + Pertuzumab (THP) q21d     06/25/2021 - 01/02/2022 Chemotherapy   Patient is on Treatment Plan : BREAST Docetaxel + Trastuzumab + Pertuzumab (THP) q21d     09/14/2021 Imaging   CT chest/abdomen/pelvis  IMPRESSION:  1. Interval resolution of previously seen bulky left axillary and  subpectoral lymphadenopathy. No persistently enlarged lymph nodes.  2. Multiple liver lesions are significantly diminished in size.  3. Widespread osseous metastatic disease, with an interval increase  in sclerosis of several previously lytic lesions.  4. Constellation of findings is consistent with treatment response  of metastatic disease  5. New, although age indeterminate pathologic wedge deformity of the  T6 vertebral body as well as an increased pathologic wedge deformity  of the L2 vertebral body.  6. Coronary artery disease.  Aortic Atherosclerosis (ICD10-I70.0).    01/26/2022 -  Chemotherapy   Patient is on Treatment Plan : BREAST METASTATIC Fam-Trastuzumab Deruxtecan-nxki (Enhertu) (5.4) q21d     Malignant neoplasm metastatic to bone ([name])  04/17/2021 Imaging   CT ABDOMEN AND PELVIS WITH CONTRAST: -New lytic lesion involving the L2 vertebral body (6:72, 2:26) with minimal cortical breakthrough superiorly, but with preservation of vertebral body height, concerning for metastatic disease.  -There are several indeterminate hepatic lesions as described above. In addition to the lesions described above, there is an additional subtle 1.5 cm hypodensity in the hepatic dome (2:7). Given clinical history and presence of a new L2 lesion,  leading differential consideration is metastatic disease. Recommend further evaluation with MRI of the abdomen with and without contrast.   05/12/2021 Imaging   MRI ABDOMEN WITH AND WITHOUT CONTRAST: Numerous heterogeneously enhancing, internally necrotic lesions in the right hepatic lobe, highly concerning for metastatic disease. Largest lesion measures up to 6.7 cm in hepatic segment VI.   Enhancing osseous lesions in the L2 vertebral body, both iliac bones, and in the sacrum, highly concerning for osseous metastases.   05/15/2021 Initial Diagnosis   Bone metastases ([name])   05/27/2021 PET scan   1. Widespread recurrent/metastatic disease in this patient who is  status post bilateral mastectomy. Left chest wall recurrence with  left axillary/subpectoral, supraclavicular nodal metastasis.  2. Hepatic, left adrenal, and widespread osseous metastasis.  3. Incidental findings, including: Right nephrolithiasis. Tiny  hiatal hernia.    05/27/2021 Imaging   MRI LUMBAR AND THORACIC SPINE: Thoracic spine:  Osseous metastatic disease involving each level. The most dramatic deposits are at T4 and T6 where there is extraosseous/epidural tumor. No cord compression but there is foraminal impingement on the left at T6-7 and on the right at T3-4. Early dorsal epidural tumor at the level of T10 and T3.   Lumbar spine:  1. Widespread osseous metastatic disease with largest deposit  replacing the L2 body where there is a compression fracture with  mild height loss. Also at this level is early epidural tumor  extension likely affecting both L2-3 foramina.  2. 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    05/27/2021 Imaging   MRI HEAD WITH AND WITHOUT CONTRAST: Negative for metastatic disease to the brain or calvarium.   06/03/2021 - 06/24/2021 Chemotherapy   Patient is on Treatment Plan : BREAST Trastuzumab q21d X 11 Cycles     06/25/2021 - 01/01/2022 Chemotherapy   Patient is on  Treatment Plan : BREAST Docetaxel + Trastuzumab + Pertuzumab (THP) q21d     06/25/2021 - 01/02/2022 Chemotherapy   Patient is on Treatment Plan : BREAST Docetaxel + Trastuzumab + Pertuzumab (THP) q21d     09/14/2021 Imaging   CT chest/abdomen/pelvis  IMPRESSION:  1. Interval resolution of previously seen bulky left axillary and  subpectoral lymphadenopathy. No persistently enlarged lymph nodes.  2. Multiple liver lesions are significantly diminished in size.  3. Widespread osseous metastatic disease, with an interval increase  in sclerosis of several previously lytic lesions.  4. Constellation of findings is consistent with treatment response  of metastatic disease  5. New, although age indeterminate pathologic wedge deformity of the  T6 vertebral  body as well as an increased pathologic wedge deformity  of the L2 vertebral body.  6. Coronary artery disease.  Aortic Atherosclerosis (ICD10-I70.0).    01/26/2022 -  Chemotherapy   Patient is on Treatment Plan : BREAST METASTATIC Fam-Trastuzumab Deruxtecan-nxki (Enhertu) (5.4) q21d         INTERVAL HISTORY:  Jasmin Lewis is here today for repeat clinical assessment prior to 27th cycle of Enhertu. She continues to tolerate this fairly well, except for excessive tearing.  She reports persistent weakness, pain and tingling in the left hand, as well lymphedema of the left arm. Diclofenac gel helps the pain some. She denies fevers or chills. She denies pain. Her appetite is good. Her weight has been stable.  She has been to Dr. Aloha Arnold, [facility], who did not recommend surgery for her hand.  REVIEW OF SYSTEMS:  Review of Systems  Constitutional:  Negative for appetite change, chills, diaphoresis, fatigue, fever and unexpected weight change.  HENT:   Negative for lump/mass, mouth sores, nosebleeds and sore throat.   Eyes:  Positive for eye problems (excessive tearing).  Respiratory:  Negative for cough, hemoptysis and shortness of breath.    Cardiovascular:  Negative for chest pain and leg swelling.  Gastrointestinal:  Positive for constipation (meds effective). Negative for abdominal pain, blood in stool, diarrhea, nausea and vomiting.  Genitourinary:  Negative for difficulty urinating, dysuria, frequency, hematuria and vaginal bleeding.   Musculoskeletal:  Positive for gait problem (walks with cane). Negative for back pain, myalgias and neck pain. Arthralgias: right shoulder, left hand/arm. Skin:  Negative for rash.  Neurological:  Positive for extremity weakness (left hand), gait problem (walks with cane) and numbness (left hand). Negative for dizziness, headaches and light-headedness.  Hematological:  Negative for adenopathy. Does not bruise/bleed easily.  Psychiatric/Behavioral:  Negative for depression and sleep disturbance. The patient is not nervous/anxious.      VITALS:  Blood pressure (!) 152/77, pulse 74, temperature 98.2 F (36.8 C), temperature source Oral, resp. rate 18, height 5' 0.25" (1.53 m), weight 112 lb 11.2 oz (51.1 kg), SpO2 95%.  Wt Readings from Last 3 Encounters:  07/27/23 112 lb 11.2 oz (51.1 kg)  07/06/23 111 lb 9.6 oz (50.6 kg)  06/15/23 109 lb (49.4 kg)    Body mass index is 21.83 kg/m.  Performance status (ECOG): 2 - Symptomatic, <50% confined to bed  PHYSICAL EXAM:  Physical Exam Vitals and nursing note reviewed.  Constitutional:      General: She is not in acute distress.    Appearance: Normal appearance.  HENT:     Head: Normocephalic and atraumatic.     Mouth/Throat:     Mouth: Mucous membranes are moist.     Pharynx: Oropharynx is clear. No oropharyngeal exudate or posterior oropharyngeal erythema.  Eyes:     General: No scleral icterus.    Extraocular Movements: Extraocular movements intact.     Conjunctiva/sclera: Conjunctivae normal.     Pupils: Pupils are equal, round, and reactive to light.  Cardiovascular:     Rate and Rhythm: Normal rate and regular rhythm.     Heart  sounds: Normal heart sounds. No murmur heard.    No friction rub. No gallop.  Pulmonary:     Effort: Pulmonary effort is normal.     Breath sounds: Normal breath sounds. No wheezing, rhonchi or rales.  Chest:  Breasts:    Right: Normal. No swelling, bleeding, inverted nipple, mass, nipple discharge, skin change or tenderness.     Left:  Absent.     Comments: Left mastectomy site is negative. Abdominal:     General: There is no distension.     Palpations: Abdomen is soft. There is no hepatomegaly, splenomegaly or mass.     Tenderness: There is no abdominal tenderness.  Musculoskeletal:     Left upper arm: Edema (1+ lymphedema proximal left arm) present.     Right hand: No swelling.     Left hand: Deformity present. No swelling. Decreased range of motion. Decreased strength.     Cervical back: Normal range of motion and neck supple. No tenderness.     Right lower leg: No edema.     Left lower leg: No edema.  Lymphadenopathy:     Cervical: No cervical adenopathy.     Upper Body:     Right upper body: No supraclavicular or axillary adenopathy.     Left upper body: No supraclavicular or axillary adenopathy.     Lower Body: No right inguinal adenopathy.  Skin:    General: Skin is warm and dry.     Coloration: Skin is not jaundiced.     Findings: No rash.  Neurological:     Mental Status: She is alert and oriented to person, place, and time.     Cranial Nerves: No cranial nerve deficit.  Psychiatric:        Mood and Affect: Mood normal.        Behavior: Behavior normal.        Thought Content: Thought content normal.     LABS:      Latest Ref Rng & Units 07/27/2023   12:57 PM 07/06/2023    1:26 PM 06/15/2023   12:57 PM  CBC  WBC 4.0 - 10.5 K/uL 4.5  4.7  5.5   Hemoglobin 12.0 - 15.0 g/dL 16.1  09.6  04.5   Hematocrit 36.0 - 46.0 % 37.9  37.8  38.2   Platelets 150 - 400 K/uL 169  175  181       Latest Ref Rng & Units 07/27/2023   12:57 PM 07/06/2023    1:26 PM 06/15/2023    12:57 PM  CMP  Glucose 70 - 99 mg/dL 409  98  95   BUN 8 - 23 mg/dL 19  23  23    Creatinine 0.44 - 1.00 mg/dL 8.11  9.14  7.82   Sodium 135 - 145 mmol/L 144  142  141   Potassium 3.5 - 5.1 mmol/L 4.3  4.3  4.3   Chloride 98 - 111 mmol/L 106  107  106   CO2 22 - 32 mmol/L 26  26  27    Calcium 8.9 - 10.3 mg/dL 95.6  21.3  08.6   Total Protein 6.5 - 8.1 g/dL 6.4  6.6  6.8   Total Bilirubin 0.0 - 1.2 mg/dL 0.4  0.4  0.4   Alkaline Phos 38 - 126 U/L 68  71  70   AST 15 - 41 U/L 41  42  39   ALT 0 - 44 U/L 17  18  19       Lab Results  Component Value Date   CEA1 1.8 06/03/2021   CEA 0.5 11/13/2012   /  CEA  Date Value Ref Range Status  06/03/2021 1.8 0.0 - 4.7 ng/mL Final    Comment:    (NOTE)  Nonsmokers          <3.9                             Smokers             <5.6 [employer] Electrochemiluminescence Immunoassay (ECLIA) Values obtained with different assay methods or kits cannot be used interchangeably.  Results cannot be interpreted as absolute evidence of the presence or absence of malignant disease. Performed At: [facility] [address] [place], [place] [id number] Pearlean Botts MD WG:[id number]   11/13/2012 0.5 0.0 - 5.0 ng/mL Final    Lab Results  Component Value Date   LDH 186 11/12/2011   LDH 187 09/08/2010   LDH 171 09/09/2009    STUDIES:  No results found.    HISTORY:   Past Medical History:  Diagnosis Date   Arthritis    shoulder   Breast cancer ([name])    Cancer ([name])    left breast ca   Colon cancer ([name])    [name], stage II 11/12/2011   Sigmoid lesion 5.9 cm  40 nodes negative but focus of cancer in a diverticum   Pre-op CEA 5.2 with lab normal up to 2.5 resected 09/21/05   Xeloda adjuvant chemotherapy   Hypertension    Polyneuropathy 02/03/2023   Right shoulder pain 04/12/2023    Past Surgical History:  Procedure Laterality Date   COLONOSCOPY     HEMICOLECTOMY  2007    MASTECTOMY WITH AXILLARY LYMPH NODE DISSECTION Left 03/14/2019   Procedure: LEFT MASTECTOMY WITH TARGETED LYMPH NODE DISSECTION;  Surgeon: Caralyn Chandler, MD;  Location: [facility] OR;  Service: General;  Laterality: Left;   SENTINEL NODE BIOPSY Left 03/14/2019   Procedure: Left Axillary Sentinel Lymph  Node Biopsy;  Surgeon: Caralyn Chandler, MD;  Location: [facility] OR;  Service: General;  Laterality: Left;   TONSILLECTOMY     WISDOM TOOTH EXTRACTION      Family History  Problem Relation Age of Onset   Cancer Maternal Aunt        colon cancer     Social History:  reports that she has never smoked. She has never used smokeless tobacco. She reports that she does not drink alcohol and does not use drugs.The patient is alone today.  Allergies: No Known Allergies  Current Medications: Current Outpatient Medications  Medication Sig Dispense Refill   Biotin 1 MG CAPS Take by mouth.     co-enzyme Q-10 30 MG capsule Take 100 mg by mouth daily.     fish oil-omega-3 fatty acids 1000 MG capsule Take 1 g by mouth daily.     gabapentin (NEURONTIN) 100 MG capsule Take 1 capsule (100 mg total) by mouth at bedtime. (Patient not taking: Reported on 05/02/2023) 30 capsule 5   magnesium citrate SOLN Take 0.5 Bottles by mouth as needed for severe constipation.     Multiple Vitamin (MULTIVITAMIN) capsule Take 1 capsule by mouth daily.     ondansetron (ZOFRAN) 4 MG tablet Take 1 tablet (4 mg total) by mouth every 8 (eight) hours as needed for nausea or vomiting. (Patient not taking: Reported on 05/02/2023) 20 tablet 2   polyethylene glycol (MIRALAX / GLYCOLAX) 17 g packet Take 17 g by mouth as needed. constipation     No current facility-administered medications for this visit.   Facility-Administered Medications Ordered in Other Visits  Medication Dose Route Frequency Provider Last Rate Last Admin  acetaminophen (TYLENOL) tablet 650 mg  650 mg Oral Once Jasmin Baumgartner, MD       dexamethasone (DECADRON) injection  10 mg  10 mg Intravenous Once Jasmin Baumgartner, MD       diphenhydrAMINE (BENADRYL) capsule 25 mg  25 mg Oral Once Jasmin Baumgartner, MD       fam-trastuzumab deruxtecan-nxki (ENHERTU) 200 mg in dextrose 5 % 100 mL chemo infusion  4.05 mg/kg (Treatment Plan Recorded) Intravenous Once Jasmin Baumgartner, MD       heparin lock flush 100 unit/mL  500 Units Intracatheter Once PRN Jasmin Baumgartner, MD       palonosetron (ALOXI) injection 0.25 mg  0.25 mg Intravenous Once Jasmin Baumgartner, MD       sodium chloride flush (NS) 0.9 % injection 10 mL  10 mL Intracatheter PRN Jasmin Baumgartner, MD

## 2023-07-27 NOTE — Telephone Encounter (Signed)
 07/27/23 Spoke with patient and confirmed next appt.

## 2023-07-27 NOTE — Patient Instructions (Signed)
 [facility] [place] - A DEPT OF [facility]  Discharge Instructions: Thank you for choosing [facility] to provide your oncology and hematology care.  If you have a lab appointment with the Cancer Center, please go directly to the Cancer Center and check in at the registration area.   Wear comfortable clothing and clothing appropriate for easy access to any Portacath or PICC line.   We strive to give you quality time with your provider. You may need to reschedule your appointment if you arrive late (15 or more minutes).  Arriving late affects you and other patients whose appointments are after yours.  Also, if you miss three or more appointments without notifying the office, you may be dismissed from the clinic at the provider's discretion.      For prescription refill requests, have your pharmacy contact our office and allow 72 hours for refills to be completed.    Today you received the following chemotherapy and/or immunotherapy agents Enhertu      To help prevent nausea and vomiting after your treatment, we encourage you to take your nausea medication as directed.  BELOW ARE SYMPTOMS THAT SHOULD BE REPORTED IMMEDIATELY: *FEVER GREATER THAN 100.4 F (38 C) OR HIGHER *CHILLS OR SWEATING *NAUSEA AND VOMITING THAT IS NOT CONTROLLED WITH YOUR NAUSEA MEDICATION *UNUSUAL SHORTNESS OF BREATH *UNUSUAL BRUISING OR BLEEDING *URINARY PROBLEMS (pain or burning when urinating, or frequent urination) *BOWEL PROBLEMS (unusual diarrhea, constipation, pain near the anus) TENDERNESS IN MOUTH AND THROAT WITH OR WITHOUT PRESENCE OF ULCERS (sore throat, sores in mouth, or a toothache) UNUSUAL RASH, SWELLING OR PAIN  UNUSUAL VAGINAL DISCHARGE OR ITCHING   Items with * indicate a potential emergency and should be followed up as soon as possible or go to the Emergency Department if any problems should occur.  Please show the CHEMOTHERAPY ALERT CARD or IMMUNOTHERAPY ALERT  CARD at check-in to the Emergency Department and triage nurse.  Should you have questions after your visit or need to cancel or reschedule your appointment, please contact [facility] [place] - A DEPT OF [facility]  Dept: [phone]  and follow the prompts.  Office hours are 8:00 a.m. to 4:30 p.m. Monday - Friday. Please note that voicemails left after 4:00 p.m. may not be returned until the following business day.  We are closed weekends and major holidays. You have access to a nurse at all times for urgent questions. Please call the main number to the clinic Dept: [phone] and follow the prompts.  For any non-urgent questions, you may also contact your provider using MyChart. We now offer e-Visits for anyone 23 and older to request care online for non-urgent symptoms. For details visit [email or web address].   Also download the MyChart app! Go to the app store, search "MyChart", open the app, select [facility], and log in with your MyChart username and password.

## 2023-07-28 ENCOUNTER — Other Ambulatory Visit: Payer: Self-pay

## 2023-07-29 ENCOUNTER — Encounter: Payer: Self-pay | Admitting: Oncology

## 2023-08-01 ENCOUNTER — Other Ambulatory Visit: Payer: Self-pay

## 2023-08-10 ENCOUNTER — Encounter: Payer: Self-pay | Admitting: Oncology

## 2023-08-11 ENCOUNTER — Ambulatory Visit (INDEPENDENT_AMBULATORY_CARE_PROVIDER_SITE_OTHER)
Admission: RE | Admit: 2023-08-11 | Discharge: 2023-08-11 | Disposition: A | Discharge status: Home / Self Care | Source: Ambulatory Visit | Attending: Oncology | Admitting: Oncology

## 2023-08-11 DIAGNOSIS — C787 Secondary malignant neoplasm of liver and intrahepatic bile duct: Secondary | ICD-10-CM | POA: Diagnosis not present

## 2023-08-11 MED ORDER — IOHEXOL 300 MG/ML  SOLN
100.0000 mL | Freq: Once | INTRAMUSCULAR | Status: AC | PRN
  Administered 2023-08-11: 100 mL via INTRAVENOUS

## 2023-08-11 MED ORDER — HEPARIN SOD (PORK) LOCK FLUSH 100 UNIT/ML IV SOLN
500.0000 [IU] | Freq: Once | INTRAVENOUS | Status: AC
  Administered 2023-08-11: 500 [IU] via INTRAVENOUS

## 2023-08-12 ENCOUNTER — Encounter: Payer: Self-pay | Admitting: Oncology

## 2023-08-17 ENCOUNTER — Encounter: Payer: Self-pay | Admitting: Oncology

## 2023-08-17 ENCOUNTER — Inpatient Hospital Stay

## 2023-08-17 ENCOUNTER — Inpatient Hospital Stay: Attending: Hematology and Oncology | Admitting: Oncology

## 2023-08-17 VITALS — BP 162/69 | HR 75 | Temp 97.5°F | Resp 18 | Ht 60.25 in | Wt 110.3 lb

## 2023-08-17 DIAGNOSIS — F32A Depression, unspecified: Secondary | ICD-10-CM | POA: Diagnosis not present

## 2023-08-17 DIAGNOSIS — C50412 Malignant neoplasm of upper-outer quadrant of left female breast: Secondary | ICD-10-CM

## 2023-08-17 DIAGNOSIS — I972 Postmastectomy lymphedema syndrome: Secondary | ICD-10-CM | POA: Diagnosis not present

## 2023-08-17 DIAGNOSIS — Z79899 Other long term (current) drug therapy: Secondary | ICD-10-CM | POA: Diagnosis not present

## 2023-08-17 DIAGNOSIS — Z800 Family history of malignant neoplasm of digestive organs: Secondary | ICD-10-CM | POA: Diagnosis not present

## 2023-08-17 DIAGNOSIS — M6250 Muscle wasting and atrophy, not elsewhere classified, unspecified site: Secondary | ICD-10-CM | POA: Diagnosis not present

## 2023-08-17 DIAGNOSIS — C7951 Secondary malignant neoplasm of bone: Secondary | ICD-10-CM

## 2023-08-17 DIAGNOSIS — C787 Secondary malignant neoplasm of liver and intrahepatic bile duct: Secondary | ICD-10-CM

## 2023-08-17 DIAGNOSIS — Z9012 Acquired absence of left breast and nipple: Secondary | ICD-10-CM | POA: Insufficient documentation

## 2023-08-17 DIAGNOSIS — Z85038 Personal history of other malignant neoplasm of large intestine: Secondary | ICD-10-CM | POA: Insufficient documentation

## 2023-08-17 DIAGNOSIS — K5900 Constipation, unspecified: Secondary | ICD-10-CM | POA: Diagnosis not present

## 2023-08-17 DIAGNOSIS — Z5112 Encounter for antineoplastic immunotherapy: Secondary | ICD-10-CM | POA: Insufficient documentation

## 2023-08-17 DIAGNOSIS — Z171 Estrogen receptor negative status [ER-]: Secondary | ICD-10-CM

## 2023-08-17 DIAGNOSIS — G629 Polyneuropathy, unspecified: Secondary | ICD-10-CM | POA: Diagnosis not present

## 2023-08-17 LAB — CMP (CANCER CENTER ONLY)
ALT: 21 U/L (ref 0–44)
AST: 45 U/L — ABNORMAL HIGH (ref 15–41)
Albumin: 4.2 g/dL (ref 3.5–5.0)
Alkaline Phosphatase: 67 U/L (ref 38–126)
Anion gap: 10 (ref 5–15)
BUN: 21 mg/dL (ref 8–23)
CO2: 26 mmol/L (ref 22–32)
Calcium: 10.7 mg/dL — ABNORMAL HIGH (ref 8.9–10.3)
Chloride: 106 mmol/L (ref 98–111)
Creatinine: 0.82 mg/dL (ref 0.44–1.00)
GFR, Estimated: >60 mL/min (ref >60)
Glucose, Bld: 89 mg/dL (ref 70–99)
Potassium: 4.5 mmol/L (ref 3.5–5.1)
Sodium: 142 mmol/L (ref 135–145)
Total Bilirubin: 0.5 mg/dL (ref 0.0–1.2)
Total Protein: 6.6 g/dL (ref 6.5–8.1)

## 2023-08-17 LAB — CBC WITH DIFFERENTIAL (CANCER CENTER ONLY)
Abs Immature Granulocytes: 0.01 10*3/uL (ref 0.00–0.07)
Basophils Absolute: 0 10*3/uL (ref 0.0–0.1)
Basophils Relative: 1 %
Eosinophils Absolute: 0.1 10*3/uL (ref 0.0–0.5)
Eosinophils Relative: 1 %
HCT: 39.9 % (ref 36.0–46.0)
Hemoglobin: 13.5 g/dL (ref 12.0–15.0)
Immature Granulocytes: 0 %
Lymphocytes Relative: 36 %
Lymphs Abs: 1.9 10*3/uL (ref 0.7–4.0)
MCH: 35 pg — ABNORMAL HIGH (ref 26.0–34.0)
MCHC: 33.8 g/dL (ref 30.0–36.0)
MCV: 103.4 fL — ABNORMAL HIGH (ref 80.0–100.0)
Monocytes Absolute: 0.6 10*3/uL (ref 0.1–1.0)
Monocytes Relative: 11 %
Neutro Abs: 2.7 10*3/uL (ref 1.7–7.7)
Neutrophils Relative %: 51 %
Platelet Count: 179 10*3/uL (ref 150–400)
RBC: 3.86 MIL/uL — ABNORMAL LOW (ref 3.87–5.11)
RDW: 13.7 % (ref 11.5–15.5)
WBC Count: 5.3 10*3/uL (ref 4.0–10.5)
nRBC: 0 % (ref 0.0–0.2)
nRBC: 0 /100{WBCs}

## 2023-08-17 MED ORDER — HEPARIN SOD (PORK) LOCK FLUSH 100 UNIT/ML IV SOLN: 500.0000 [IU] | Freq: Once | INTRAVENOUS | Status: DC | PRN

## 2023-08-17 MED ORDER — ACETAMINOPHEN 325 MG PO TABS
650.0000 mg | ORAL_TABLET | Freq: Once | ORAL | Status: AC
  Administered 2023-08-17: 650 mg via ORAL
  Filled 2023-08-17: qty 2

## 2023-08-17 MED ORDER — PALONOSETRON HCL INJECTION 0.25 MG/5ML
0.2500 mg | Freq: Once | INTRAVENOUS | Status: AC
Start: 2023-08-17 — End: 2023-08-17
  Administered 2023-08-17: 0.25 mg via INTRAVENOUS
  Filled 2023-08-17: qty 5

## 2023-08-17 MED ORDER — DEXAMETHASONE SODIUM PHOSPHATE 10 MG/ML IJ SOLN
10.0000 mg | Freq: Once | INTRAMUSCULAR | Status: AC
  Administered 2023-08-17: 10 mg via INTRAVENOUS
  Filled 2023-08-17: qty 1

## 2023-08-17 MED ORDER — ZOLEDRONIC ACID 4 MG/5ML IV CONC
3.0000 mg | Freq: Once | INTRAVENOUS | Status: AC
  Administered 2023-08-17: 3 mg via INTRAVENOUS
  Filled 2023-08-17: qty 3.75

## 2023-08-17 MED ORDER — DIPHENHYDRAMINE HCL 25 MG PO CAPS
25.0000 mg | ORAL_CAPSULE | Freq: Once | ORAL | Status: AC
  Administered 2023-08-17: 25 mg via ORAL
  Filled 2023-08-17: qty 1

## 2023-08-17 MED ORDER — DEXTROSE 5 % IV SOLN: Freq: Once | INTRAVENOUS | Status: AC

## 2023-08-17 MED ORDER — SODIUM CHLORIDE 0.9% FLUSH: 10.0000 mL | INTRAVENOUS | Status: DC | PRN

## 2023-08-17 MED ORDER — FAM-TRASTUZUMAB DERUXTECAN-NXKI CHEMO 100 MG IV SOLR
4.0500 mg/kg | Freq: Once | INTRAVENOUS | Status: AC
  Administered 2023-08-17: 200 mg via INTRAVENOUS
  Filled 2023-08-17: qty 10

## 2023-08-17 MED ORDER — SODIUM CHLORIDE 0.9 % IV SOLN: Freq: Once | INTRAVENOUS | Status: DC

## 2023-08-17 NOTE — Progress Notes (Signed)
 [facility]  [address] [place],  [place]  [address] [phone]  Clinic Day: 08/17/2023  Referring physician: Alfrieda Antes, MD  ASSESSMENT & PLAN:  Assessment: Malignant neoplasm metastatic to liver [facility]) Numerous heterogeneously enhancing, internally necrotic lesions in the right hepatic lobe CT in December 2023, which was consistent with metastatic disease. Largest lesion measures up to 6.7 cm and there are at least 6 lesions.  Liver biopsy was consistent with metastatic breast cancer with negative estrogen and progesterone receptors and positive HER2 receptor. She was initially treated with trastuzumab with an excellent response with significant decrease in the size of her liver lesions and sustained response. She later had progression of this disease in the chest wall and was switched to Enhertu.  CT chest, abdomen, and pelvis in March revealed stable subtle liver lesions with at least 1 lesion is no longer visualized.    CT chest, abdomen and pelvis in October 2024 revealed a decrease in the hepatic lesions with no new lesions seen.  She continues Enhertu every 3 weeks without significant difficulty.  Although, she continues to complain bitterly of excessive tearing.  She questions whether or not she wants to continue with treatment.  Dr. Almer [facility] recommended repeat CT imaging in 6 months, but if the patient does not wish to continue treatment, I would recommend CT imaging again at this time.  The patient decided she would like to continue Enhertu, so she will proceed with a 28th cycle of Enhertu today.    Malignant neoplasm metastatic to bone [facility]) Bone metastases diagnosed in December 2022, for which she was placed on zoledronic acid every 4 weeks. She is now receiving zoledronic acid every 6 weeks to correspond with her chemotherapy.  She has borderline hypercalcemia.  CT imaging in October 2024 did not reveal any evidence of progressive disease.  She did have repeat CT  imaging again in April 2025, and official report is pending.   Chest wall recurrence of breast cancer, left ([name]) Left chest wall recurrence diagnosed in January 2023.  Biopsy revealed carcinoma consistent with her previous breast cancer with negative estrogen and progesterone receptors and positive HER2 receptor.  She was initially treated with docetaxel/trastuzumab and received 10 cycles with a good response.  She then had recurrence in the left chest wall in September 2023 with very aggressive behavior and rapid growth, so was switched to Enhertu with good response.   CT chest, abdomen and pelvis in October 2024 revealed a decrease in the hepatic metastasis and no new metastasis. She does not have recurrent chest wall lesions.  She did have repeat CT imaging again in April 2025 and official report is pending, but it appears stable.  Polyneuropathy She has had chronic left hand pain, initially thought to be related to lymphedema.  Her lymphedema was controlled. She has decreased strength and muscle wasting.  She was referred to neurology for further evaluation.  EMG revealed polyneuropathy.  She has now seen orthopedics and had an MRI of the left brachial plexus that was unremarkable, but limited due to patient motion.  She had been on gabapentin 100 mg at bedtime, but discontinued that on her own. She saw Dr. Eugene [facility] in neurology in October 2024 and underwent repeat EMG, which was more abnormal. She has been seen by Dr. Aloha [facility] with [name] in [place], who did not feel surgery was a good option. The patient no longer sees occupational therapy, but does exercises at home. I have urged her to try  the gabapentin as I only prescribed a low dose of 100 mg at bedtime and would recommend titration to 200 or even 300 mg if tolerated.  Plan:  Patient states that she is feeling down due to her persistent right hand weakness and atrophy which has now been giving her pain. She states that she does not have a life  anymore and is unhappy due to this. She has not been taking her Gabapentin 100 mg and I encouraged her to start taking this again starting once at bed time and to titrate up during the week to eventually 300mg  at bedtime. She had a CT chest, abdomen, and pelvis done on 08/11/2023 and results are pending but the images look good to me. Her day 1 cycle 28 of Enhertu is scheduled on 08/17/2023. She has a WBC of 5.3, hemoglobin of 13.5, and platelet count of 179,000. Her CMP is normal other than a elevated calcium of 10.7 and AST of 45. I will see her back in 3 weeks with CBC and CMP. The patient understands the plans discussed today and is in agreement with them.  She knows to contact our office if she develops concerns prior to her next appointment.  I provided 36 minutes of face-to-face time during this encounter and > 50% was spent counseling as documented under my assessment and plan.   Jasmin Beckwith, MD [facility] OF [facility] [address] [name] [address] Dept: [phone] Dept Fax: [phone]   [name].   CHIEF COMPLAINT:  CC: Recurrent HER2 positive breast cancer  Current Treatment: Enhertu every 3 weeks/zoledronic acid every 6 weeks  HISTORY OF PRESENT ILLNESS:   Oncology History Overview Note  Cancer Staging Malignant neoplasm of upper-outer quadrant of left breast in female, estrogen receptor negative ([name]) Staging form: Breast, AJCC 8th Edition - Clinical stage from 10/11/2018: Stage IIB (cT2, cN1, cM0, G3, ER-, PR-, HER2+) - Signed by Jasmin Mood, MD on 11/02/2018 - Clinical: No stage assigned - Unsigned    History of colon cancer, stage II  09/2005 Initial Diagnosis   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 Findings were a 5.9 x 3.8 x 1.2 cm moderate  to well-differentiated adenocarcinoma penetrating the muscular wall. Forty lymph nodes negative. No vascular or lymphatic invasion. Multiple diverticula with microabscess formation and inflammation. Carcinoma present in at least 1 diverticulum. Preop CEA 5.2 with lab normal range 0 to 2.5. Preop CT scan with no obvious additional pathology.    2007 -  Chemotherapy   She received oral Xeloda chemotherapy as an adjuvant. She declined treatment on a clinical trial.    11/12/2011 Initial Diagnosis   [name], stage II   12/09/2011 Procedure   Followup colonoscopy done on 12/09/2011. She was found to have 2 polyps which were removed. Chronic diverticulosis.     Malignant neoplasm of upper-outer quadrant of left breast in female, estrogen receptor negative ([name])  09/26/2018 Mammogram   Mammogram/US of left breast 09/26/18 IMPRESSION:  1. 2.9cm irregular mass in the upper outer left breast corresponds to the palpable abnormality. This is highly suspicious for breast carcinoma.  2. Two adjacent abnormal left axillary  Lymph nodes suspicious for metastatic adenopathy. There is a third borderline abnormal left axillary LN with a cortex thickened to  4mm.  3. Benign right breast cyst. No evidence of right breast malignancy.    10/11/2018 Cancer Staging   Staging form: Breast, AJCC 8th Edition - Clinical stage from 10/11/2018: Stage IIB (cT2, cN1, cM0, G3, ER-, PR-, HER2+) - Signed by Sonja Casas Adobes, MD on 11/02/2018   10/11/2018 Initial Biopsy   Diagnosis 10/11/18 1. Breast, left, needle core biopsy, upper outer left 2 o'clock - INVASIVE DUCTAL CARCINOMA. - DUCTAL CARCINOMA IN SITU. - LYMPHOVASCULAR INVASION IS IDENTIFIED. - SEE COMMENT. 2. Lymph node, needle/core biopsy, left axilla - INVASIVE DUCTAL CARCINOMA. - SEE COMMENT.   10/11/2018 Receptors her2   The tumor cells are POSITIVE for Her2 (3+). Estrogen Receptor: 0%, NEGATIVE Progesterone Receptor: 0%, NEGATIVE Proliferation Marker Ki67: 70%    11/02/2018 Initial Diagnosis   Malignant neoplasm of upper-outer quadrant of left breast in female, estrogen receptor negative ([name])   11/15/2018 Breast MRI   MRI breast 11/15/18  IMPRESSION: 1. 3.3 centimeter mass in the LATERAL portion of the LEFT breast consistent with known malignancy. 2. There is significant non mass enhancement surrounding this mass and extending anteriorly into the nipple base, with largest diameter in the anterior to posterior axis, measuring 7.2 centimeters. 3. If the patient would consider breast conservation, additional MR guided core biopsies are recommended. Consider biopsy of the inferior and anterior extent of the non mass enhancement to document extent of disease. 4. Three enlarged LEFT axillary lymph nodes. 5. RIGHT breast is negative.   11/15/2018 PET scan   PET 11/15/18 IMPRESSION: Hypermetabolic left breast lesion compatible with known primary. Hypermetabolic left axillary lymph nodes are consistent with metastatic disease.   No evidence for additional hypermetabolic metastatic involvement in the neck, chest, abdomen, or pelvis.   11/17/2018 - 03/01/2019 Chemotherapy   Neo-adjuvant Kadcyla and perjeta q3weeks for 6 cycles starting 11/17/18. Stopped before surgery    11/29/2018 Pathology Results   Diagnosis 1. Breast, left, needle core biopsy, inferior anterior (cylinder clip) - INVASIVE DUCTAL CARCINOMA. - DUCTAL CARCINOMA IN SITU. - LYMPHOVASCULAR INVASION IS IDENTIFIED. - SEE COMMENT. 2. Breast, left, needle core biopsy, central posterior (barbell clip) - INVASIVE DUCTAL CARCINOMA. - LYMPHOVASCULAR INVASION IS IDENTIFIED.   02/12/2019 Breast MRI   IMPRESSION: 1. Complete resolution of previously identified enhancing mass and associated non mass enhancement within the left breast. This is consistent with excellent response to chemotherapy. No residual or suspicious findings are identified. 2. No MRI evidence of malignancy on the right. 3.  Previously identified left axillary lymphadenopathy not definitively seen on today's study. However, this may be due to decreased field-of-view compared to prior study.     03/14/2019 Cancer Staging   Staging form: Breast, AJCC 8th Edition - Pathologic stage from 03/14/2019: pT0, pN0, cM0, GX, ER: Unknown, PR: Unknown, HER2: Not Assessed - Signed by Sonja Hudson, MD on 03/28/2019   03/14/2019 Surgery   LEFT MASTECTOMY WITH TARGETED LYMPH NODE DISSECTION and Left Axillary Sentinel Lymph  Node Biopsy by Dr Alethea Andes 03/14/19    03/14/2019 Pathology Results   FINAL MICROSCOPIC DIAGNOSIS:   A. LYMPH NODE, LEFT, SENTINEL, BIOPSY:  - There is no evidence of carcinoma in 1 of 1 lymph node (0/1).   B. BREAST, LEFT, MASTECTOMY:  - Benign breast parenchyma with treatment-related changes.  - There is no evidence of malignancy.  - See oncology table below.   C. LYMPH NODE, LEFT #1, SENTINEL, BIOPSY:  - There is no evidence of carcinoma in 1 of 1 lymph node (0/1).   D. LYMPH NODE,  LEFT #2, SENTINEL, BIOPSY:  - There is no evidence of carcinoma in 1 of 1 lymph node (0/1).     04/04/2019 - 04/25/2019 Chemotherapy   Maintenance Herceptin injections every 3 weeks starting 04/03/19 to complete 1 year of treatment that was started in 11/2018. Stopped after 2nd dose as she will not be repeating Echos.    06/03/2021 - 06/24/2021 Chemotherapy   Patient is on Treatment Plan : BREAST Trastuzumab q21d X 11 Cycles     06/25/2021 - 01/01/2022 Chemotherapy   Patient is on Treatment Plan : BREAST Docetaxel + Trastuzumab + Pertuzumab (THP) q21d     06/25/2021 - 01/02/2022 Chemotherapy   Patient is on Treatment Plan : BREAST Docetaxel + Trastuzumab + Pertuzumab (THP) q21d     09/14/2021 Imaging   CT chest/abdomen/pelvis  IMPRESSION:  1. Interval resolution of previously seen bulky left axillary and  subpectoral lymphadenopathy. No persistently enlarged lymph nodes.  2. Multiple liver lesions are significantly diminished  in size.  3. Widespread osseous metastatic disease, with an interval increase  in sclerosis of several previously lytic lesions.  4. Constellation of findings is consistent with treatment response  of metastatic disease  5. New, although age indeterminate pathologic wedge deformity of the  T6 vertebral body as well as an increased pathologic wedge deformity  of the L2 vertebral body.  6. Coronary artery disease.  Aortic Atherosclerosis (ICD10-I70.0).    01/26/2022 -  Chemotherapy   Patient is on Treatment Plan : BREAST METASTATIC Fam-Trastuzumab Deruxtecan-nxki (Enhertu) (5.4) q21d     Malignant neoplasm metastatic to liver ([name])  04/17/2021 Imaging   CT ABDOMEN AND PELVIS WITH CONTRAST: -New lytic lesion involving the L2 vertebral body (6:72, 2:26) with minimal cortical breakthrough superiorly, but with preservation of vertebral body height, concerning for metastatic disease.  -There are several indeterminate hepatic lesions as described above. In addition to the lesions described above, there is an additional subtle 1.5 cm hypodensity in the hepatic dome (2:7). Given clinical history and presence of a new L2 lesion, leading differential consideration is metastatic disease. Recommend further evaluation with MRI of the abdomen with and without contrast.   05/12/2021 Imaging   MRI ABDOMEN WITH AND WITHOUT CONTRAST: Numerous heterogeneously enhancing, internally necrotic lesions in the right hepatic lobe, highly concerning for metastatic disease. Largest lesion measures up to 6.7 cm in hepatic segment VI.   Enhancing osseous lesions in the L2 vertebral body, both iliac bones, and in the sacrum, highly concerning for osseous metastases.   05/15/2021 Initial Diagnosis   Liver metastases ([name])   05/27/2021 PET scan   1. Widespread recurrent/metastatic disease in this patient who is  status post bilateral mastectomy. Left chest wall recurrence with  left axillary/subpectoral, supraclavicular nodal  metastasis.  2. Hepatic, left adrenal, and widespread osseous metastasis.  3. Incidental findings, including: Right nephrolithiasis. Tiny  hiatal hernia.    05/27/2021 Imaging   MRI LUMBAR AND THORACIC SPINE: Thoracic spine:  Osseous metastatic disease involving each level. The most dramatic deposits are at T4 and T6 where there is extraosseous/epidural tumor. No cord compression but there is foraminal impingement on the left at T6-7 and on the right at T3-4. Early dorsal epidural tumor at the level of T10 and T3.   Lumbar spine:  1. Widespread osseous metastatic disease with largest deposit  replacing the L2 body where there is a compression fracture with  mild height loss. Also at this level is early epidural tumor  extension likely affecting both L2-3 foramina.  2. 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    05/27/2021 Imaging   MRI HEAD WITH AND WITHOUT CONTRAST: Negative for metastatic disease to the brain or calvarium.   06/03/2021 - 06/24/2021 Chemotherapy   Patient is on Treatment Plan : BREAST Trastuzumab q21d X 11 Cycles     06/25/2021 - 01/01/2022 Chemotherapy   Patient is on Treatment Plan : BREAST Docetaxel + Trastuzumab + Pertuzumab (THP) q21d     06/25/2021 - 01/02/2022 Chemotherapy   Patient is on Treatment Plan : BREAST Docetaxel + Trastuzumab + Pertuzumab (THP) q21d     09/14/2021 Imaging   CT chest/abdomen/pelvis  IMPRESSION:  1. Interval resolution of previously seen bulky left axillary and  subpectoral lymphadenopathy. No persistently enlarged lymph nodes.  2. Multiple liver lesions are significantly diminished in size.  3. Widespread osseous metastatic disease, with an interval increase  in sclerosis of several previously lytic lesions.  4. Constellation of findings is consistent with treatment response  of metastatic disease  5. New, although age indeterminate pathologic wedge deformity of the  T6 vertebral body as well as an  increased pathologic wedge deformity  of the L2 vertebral body.  6. Coronary artery disease.  Aortic Atherosclerosis (ICD10-I70.0).    01/26/2022 -  Chemotherapy   Patient is on Treatment Plan : BREAST METASTATIC Fam-Trastuzumab Deruxtecan-nxki (Enhertu) (5.4) q21d     Malignant neoplasm metastatic to bone ([name])  04/17/2021 Imaging   CT ABDOMEN AND PELVIS WITH CONTRAST: -New lytic lesion involving the L2 vertebral body (6:72, 2:26) with minimal cortical breakthrough superiorly, but with preservation of vertebral body height, concerning for metastatic disease.  -There are several indeterminate hepatic lesions as described above. In addition to the lesions described above, there is an additional subtle 1.5 cm hypodensity in the hepatic dome (2:7). Given clinical history and presence of a new L2 lesion, leading differential consideration is metastatic disease. Recommend further evaluation with MRI of the abdomen with and without contrast.   05/12/2021 Imaging   MRI ABDOMEN WITH AND WITHOUT CONTRAST: Numerous heterogeneously enhancing, internally necrotic lesions in the right hepatic lobe, highly concerning for metastatic disease. Largest lesion measures up to 6.7 cm in hepatic segment VI.   Enhancing osseous lesions in the L2 vertebral body, both iliac bones, and in the sacrum, highly concerning for osseous metastases.   05/15/2021 Initial Diagnosis   Bone metastases ([name])   05/27/2021 PET scan   1. Widespread recurrent/metastatic disease in this patient who is  status post bilateral mastectomy. Left chest wall recurrence with  left axillary/subpectoral, supraclavicular nodal metastasis.  2. Hepatic, left adrenal, and widespread osseous metastasis.  3. Incidental findings, including: Right nephrolithiasis. Tiny  hiatal hernia.    05/27/2021 Imaging   MRI LUMBAR AND THORACIC SPINE: Thoracic spine:  Osseous metastatic disease involving each level. The most dramatic deposits are at T4 and T6  where there is extraosseous/epidural tumor. No cord compression but there is foraminal impingement on the left at T6-7 and on the right at T3-4. Early dorsal epidural tumor at the level of T10 and T3.   Lumbar spine:  1. Widespread osseous metastatic disease with largest deposit  replacing the L2 body where there is a compression fracture with  mild height loss. Also at this level is early epidural tumor  extension likely affecting both L2-3 foramina.  2. 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    05/27/2021 Imaging   MRI HEAD WITH AND WITHOUT CONTRAST: Negative for metastatic disease  to the brain or calvarium.   06/03/2021 - 06/24/2021 Chemotherapy   Patient is on Treatment Plan : BREAST Trastuzumab q21d X 11 Cycles     06/25/2021 - 01/01/2022 Chemotherapy   Patient is on Treatment Plan : BREAST Docetaxel + Trastuzumab + Pertuzumab (THP) q21d     06/25/2021 - 01/02/2022 Chemotherapy   Patient is on Treatment Plan : BREAST Docetaxel + Trastuzumab + Pertuzumab (THP) q21d     09/14/2021 Imaging   CT chest/abdomen/pelvis  IMPRESSION:  1. Interval resolution of previously seen bulky left axillary and  subpectoral lymphadenopathy. No persistently enlarged lymph nodes.  2. Multiple liver lesions are significantly diminished in size.  3. Widespread osseous metastatic disease, with an interval increase  in sclerosis of several previously lytic lesions.  4. Constellation of findings is consistent with treatment response  of metastatic disease  5. New, although age indeterminate pathologic wedge deformity of the  T6 vertebral body as well as an increased pathologic wedge deformity  of the L2 vertebral body.  6. Coronary artery disease.  Aortic Atherosclerosis (ICD10-I70.0).    01/26/2022 -  Chemotherapy   Patient is on Treatment Plan : BREAST METASTATIC Fam-Trastuzumab Deruxtecan-nxki (Enhertu) (5.4) q21d      INTERVAL HISTORY:  [facility] is here today for repeat  clinical assessment for her recurrent HER2 positive breast cancer. Patient states that she is feeling down due to her persistent right hand weakness and atrophy which has now been giving her pain. She states that she does not have a life anymore and is unhappy due to this. She has not been taking her Gabapentin 100 mg and I encouraged her to start taking this again starting once at bed time and to titrate up during the week to eventually 300 mg at bedtime. She had a CT chest, abdomen, and pelvis done on 08/11/2023 and results are pending but the images look good to me. Her day 1 cycle 28 of Enhertu is scheduled on 08/17/2023. She has a WBC of 5.3, hemoglobin of 13.5, and platelet count of 179,000. Her CMP is normal other than a elevated calcium of 10.7 and AST of 45. I will see her back in 3 weeks with CBC and CMP. She denies signs of infection such as sore throat, sinus drainage, cough, or urinary symptoms.  She denies fevers or recurrent chills.She denies nausea, vomiting, chest pain, dyspnea or cough. Her appetite is poor and her weight has decreased 2 pounds over last 4 weeks .    REVIEW OF SYSTEMS:  Review of Systems  Constitutional:  Positive for appetite change. Negative for chills, diaphoresis, fatigue, fever and unexpected weight change.  HENT:  Negative.  Negative for hearing loss, lump/mass, mouth sores, nosebleeds, sore throat, tinnitus, trouble swallowing and voice change.   Eyes:  Positive for eye problems (excessive tearing). Negative for icterus.  Respiratory: Negative.  Negative for chest tightness, cough, hemoptysis, shortness of breath and wheezing.   Cardiovascular: Negative.  Negative for chest pain, leg swelling and palpitations.  Gastrointestinal:  Positive for constipation (meds effective). Negative for abdominal distention, abdominal pain, blood in stool, diarrhea, nausea, rectal pain and vomiting.  Endocrine: Negative.   Genitourinary: Negative.  Negative for bladder  incontinence, difficulty urinating, dyspareunia, dysuria, frequency, hematuria, menstrual problem, nocturia, pelvic pain, vaginal bleeding and vaginal discharge.   Musculoskeletal:  Positive for arthralgias (right shoulder, left hand/arm 7/10) and gait problem (walks with cane). Negative for back pain, flank pain, myalgias, neck pain and neck stiffness.  Skin: Negative.  Negative for itching, rash and wound.  Neurological:  Positive for extremity weakness (left hand), gait problem (walks with cane) and numbness (left hand). Negative for dizziness, headaches, light-headedness, seizures and speech difficulty.  Hematological: Negative.  Negative for adenopathy. Does not bruise/bleed easily.  Psychiatric/Behavioral:  Positive for depression. Negative for confusion, decreased concentration, sleep disturbance and suicidal ideas. The patient is not nervous/anxious.     VITALS:  Blood pressure (!) 162/69, pulse 75, temperature (!) 97.5 F (36.4 C), temperature source Oral, resp. rate 18, height 5' 0.25" (1.53 m), weight 110 lb 4.8 oz (50 kg), SpO2 96%.  Wt Readings from Last 3 Encounters:  08/17/23 110 lb 4.8 oz (50 kg)  07/27/23 112 lb 11.2 oz (51.1 kg)  07/06/23 111 lb 9.6 oz (50.6 kg)    Body mass index is 21.36 kg/m.  Performance status (ECOG): 1 - Symptomatic but completely ambulatory  PHYSICAL EXAM:  Physical Exam Vitals and nursing note reviewed.  Constitutional:      General: She is not in acute distress.    Appearance: Normal appearance.  HENT:     Head: Normocephalic and atraumatic.     Right Ear: Tympanic membrane, ear canal and external ear normal. There is no impacted cerumen.     Left Ear: Tympanic membrane, ear canal and external ear normal. There is no impacted cerumen.     Nose: Nose normal. No congestion or rhinorrhea.     Mouth/Throat:     Mouth: Mucous membranes are moist.     Pharynx: Oropharynx is clear. No oropharyngeal exudate or posterior oropharyngeal erythema.   Eyes:     General: No scleral icterus.    Extraocular Movements: Extraocular movements intact.     Conjunctiva/sclera: Conjunctivae normal.     Pupils: Pupils are equal, round, and reactive to light.  Cardiovascular:     Rate and Rhythm: Normal rate and regular rhythm.     Pulses: Normal pulses.     Heart sounds: Normal heart sounds. No murmur heard.    No friction rub. No gallop.  Pulmonary:     Effort: Pulmonary effort is normal.     Breath sounds: Normal breath sounds. No wheezing, rhonchi or rales.  Chest:  Breasts:    Right: Normal. No swelling, bleeding, inverted nipple, mass, nipple discharge, skin change or tenderness.     Left: Absent.     Comments: Left mastectomy site is negative. Abdominal:     General: Bowel sounds are normal. There is no distension.     Palpations: Abdomen is soft. There is no hepatomegaly, splenomegaly or mass.     Tenderness: There is no abdominal tenderness.  Musculoskeletal:     Left upper arm: Edema (1+ lymphedema proximal left arm) present.     Right hand: No swelling.     Left hand: Deformity present. No swelling. Decreased range of motion. Decreased strength.     Cervical back: Normal range of motion and neck supple. No tenderness.     Right lower leg: No edema.     Left lower leg: No edema.  Lymphadenopathy:     Cervical: No cervical adenopathy.     Upper Body:     Right upper body: No supraclavicular or axillary adenopathy.     Left upper body: No supraclavicular or axillary adenopathy.     Lower Body: No right inguinal adenopathy.  Skin:    General: Skin is warm and dry.     Coloration: Skin is not jaundiced.  Findings: No rash.  Neurological:     General: No focal deficit present.     Mental Status: She is alert and oriented to person, place, and time. Mental status is at baseline.     Cranial Nerves: No cranial nerve deficit.  Psychiatric:        Lewis and Affect: Lewis is depressed.        Behavior: Behavior normal.         Thought Content: Thought content normal.     LABS:      Latest Ref Rng & Units 08/17/2023   12:39 PM 07/27/2023   12:57 PM 07/06/2023    1:26 PM  CBC  WBC 4.0 - 10.5 K/uL 5.3  4.5  4.7   Hemoglobin 12.0 - 15.0 g/dL 16.1  09.6  04.5   Hematocrit 36.0 - 46.0 % 39.9  37.9  37.8   Platelets 150 - 400 K/uL 179  169  175       Latest Ref Rng & Units 08/17/2023   12:39 PM 07/27/2023   12:57 PM 07/06/2023    1:26 PM  CMP  Glucose 70 - 99 mg/dL 89  409  98   BUN 8 - 23 mg/dL 21  19  23    Creatinine 0.44 - 1.00 mg/dL 8.11  9.14  7.82   Sodium 135 - 145 mmol/L 142  144  142   Potassium 3.5 - 5.1 mmol/L 4.5  4.3  4.3   Chloride 98 - 111 mmol/L 106  106  107   CO2 22 - 32 mmol/L 26  26  26    Calcium 8.9 - 10.3 mg/dL 95.6  21.3  08.6   Total Protein 6.5 - 8.1 g/dL 6.6  6.4  6.6   Total Bilirubin 0.0 - 1.2 mg/dL 0.5  0.4  0.4   Alkaline Phos 38 - 126 U/L 67  68  71   AST 15 - 41 U/L 45  41  42   ALT 0 - 44 U/L 21  17  18       Lab Results  Component Value Date   CEA1 1.8 06/03/2021   CEA 0.5 11/13/2012   /  CEA  Date Value Ref Range Status  06/03/2021 1.8 0.0 - 4.7 ng/mL Final    Comment:    (NOTE)                             Nonsmokers          <3.9                             Smokers             <5.6 [employer] Electrochemiluminescence Immunoassay (ECLIA) Values obtained with different assay methods or kits cannot be used interchangeably.  Results cannot be interpreted as absolute evidence of the presence or absence of malignant disease. Performed At: [facility] [address] [place], [place] [id number] Pearlean Botts MD [id number]   11/13/2012 0.5 0.0 - 5.0 ng/mL Final    Lab Results  Component Value Date   LDH 186 11/12/2011   LDH 187 09/08/2010   LDH 171 09/09/2009    STUDIES:  CT CHEST ABDOMEN PELVIS W CONTRAST Result Date: 08/17/2023 CLINICAL DATA:  Assess treatment response. Invasive breast cancer. History of previous left mastectomy. Prior  colon resection for colon cancer as well. * Tracking Code:  BO * EXAM: CT CHEST, ABDOMEN, AND PELVIS WITH CONTRAST TECHNIQUE: Multidetector CT imaging of the chest, abdomen and pelvis was performed following the standard protocol during bolus administration of intravenous contrast. RADIATION DOSE REDUCTION: This exam was performed according to the departmental dose-optimization program which includes automated exposure control, adjustment of the mA and/or kV according to patient size and/or use of iterative reconstruction technique. CONTRAST:  100mL OMNIPAQUE IOHEXOL 300 MG/ML  SOLN COMPARISON:  CT 02/17/2023 and older. FINDINGS: CT CHEST FINDINGS Cardiovascular: Thoracic aorta has a normal course and caliber with scattered partially calcified plaque. Calcified plaque also seen along the great vessels in the coronary arteries. Heart is nonenlarged. Calcifications seen towards the aortic valve as well. There is right IJ catheter, port in place. The port is accessed. Catheter extends to the central SVC near the right atrial junction. Mediastinum/Nodes: Small hiatal hernia. Small cystic foci along the thyroid gland, unchanged. Surgical clips in the left axillary region from prior lymph node dissection. No specific abnormal lymph node enlargement identified in the axillary region, hilum or mediastinum. No nodes supraclavicular, chest wall or internal mammary chains as well. Lungs/Pleura: Lungs are without consolidation, pneumothorax. Most trace left pleural fluid. Mild areas of lower lung bronchiectasis with some scarring atelectatic changes. No dominant lung mass. Cul pleural thickening. Musculoskeletal: Degenerative changes seen of the spine and shoulders. There is a healing fracture of the lateral aspect of the right eighth rib with progressive sclerosis. There is extensive sclerotic bone metastases identified throughout the spine, sternum, ribs. Grossly extent distribution is similar to the prior CT scan. There is  slight compression deformity of the vertebral body at L2, T6 which is seen previously. CT ABDOMEN PELVIS FINDINGS Hepatobiliary: Stable segment 2 benign a [place] cystic lesion. There is of a low-density areas seen on the previous examination in the infra of the measuring 7 mm on the prior examination. Vaguely seen today on image 63 and unchanged. No new space-occupying liver lesion. Underlying fatty liver infiltration. Slightly nodular contour to the liver. Gallbladder is nondilated. Patent portal vein. Pancreas: Unremarkable. No pancreatic ductal dilatation or surrounding inflammatory changes. Spleen: Normal in size without focal abnormality. Adrenals/Urinary Tract: Right adrenal gland is preserved. There is left adrenal nodule identified measuring 12 by 10 mm today and previously 11 by 7 mm, slightly larger. No enhancing renal mass or collecting system dilatation. The ureters have normal course and caliber extending down to the urinary bladder. Preserved contour to the urinary bladder. Stomach/Bowel: Large bowel is normal course and caliber with scattered colonic stool. Stomach and small bowel are nondilated. Few scattered colonic diverticula. Oral contrast was administered. Vascular/Lymphatic: Aortic atherosclerosis. No enlarged abdominal or pelvic lymph nodes. Reproductive: Uterus and bilateral adnexa are unremarkable. Other: No free air or free fluid. Musculoskeletal: Curvature and degenerative changes along the spine. Degenerative changes in the pelvis. Multifocal areas of bony sclerosis again identified consistent with multifocal osseous metastatic disease. Extent and distribution is similar to previous comparing CT scans. IMPRESSION: Multifocal sclerotic bone metastases are similar to previous. Stable areas of compression deformity at L2 and T6. Separate healing right lateral rib fracture. Left adrenal nodule appears slightly larger today. Attention on follow-up. No developing new lymph node enlargement,  soft tissue mass. Fatty liver infiltration with slightly nodular contour. Vague low-attenuation liver lesion on previous is not well seen today. No new liver lesion at this time. Electronically Signed   By: Adrianna Horde M.D.   On: 08/17/2023 14:26      HISTORY:  Past Medical History:  Diagnosis Date   Arthritis    shoulder   Breast cancer ([name])    Cancer ([name])    left breast ca   Colon cancer ([name])    [name], stage II 11/12/2011   Sigmoid lesion 5.9 cm  40 nodes negative but focus of cancer in a diverticum   Pre-op CEA 5.2 with lab normal up to 2.5 resected 09/21/05   Xeloda adjuvant chemotherapy   Hypertension    Polyneuropathy 02/03/2023   Right shoulder pain 04/12/2023    Past Surgical History:  Procedure Laterality Date   COLONOSCOPY     HEMICOLECTOMY  2007   MASTECTOMY WITH AXILLARY LYMPH NODE DISSECTION Left 03/14/2019   Procedure: LEFT MASTECTOMY WITH TARGETED LYMPH NODE DISSECTION;  Surgeon: Caralyn Chandler, MD;  Location: [facility] OR;  Service: General;  Laterality: Left;   SENTINEL NODE BIOPSY Left 03/14/2019   Procedure: Left Axillary Sentinel Lymph  Node Biopsy;  Surgeon: Caralyn Chandler, MD;  Location: [facility] OR;  Service: General;  Laterality: Left;   TONSILLECTOMY     WISDOM TOOTH EXTRACTION      Family History  Problem Relation Age of Onset   Cancer Maternal Aunt        colon cancer     Social History:  reports that she has never smoked. She has never used smokeless tobacco. She reports that she does not drink alcohol and does not use drugs.The patient is alone today.  Allergies: No Known Allergies  Current Medications: Current Outpatient Medications  Medication Sig Dispense Refill   Biotin 1 MG CAPS Take by mouth.     co-enzyme Q-10 30 MG capsule Take 100 mg by mouth daily.     fish oil-omega-3 fatty acids 1000 MG capsule Take 1 g by mouth daily.     gabapentin (NEURONTIN) 100 MG capsule Take 1 capsule (100 mg total) by mouth at bedtime. (Patient not  taking: Reported on 05/02/2023) 30 capsule 5   magnesium citrate SOLN Take 0.5 Bottles by mouth as needed for severe constipation.     Multiple Vitamin (MULTIVITAMIN) capsule Take 1 capsule by mouth daily.     ondansetron (ZOFRAN) 4 MG tablet Take 1 tablet (4 mg total) by mouth every 8 (eight) hours as needed for nausea or vomiting. (Patient not taking: Reported on 05/02/2023) 20 tablet 2   polyethylene glycol (MIRALAX / GLYCOLAX) 17 g packet Take 17 g by mouth as needed. constipation     No current facility-administered medications for this visit.    I,Jasmine M Lassiter,acting as a scribe for [name], MD.,have documented all relevant documentation on the behalf of [name], MD,as directed by  [name], MD while in the presence of [name], MD.

## 2023-08-18 ENCOUNTER — Other Ambulatory Visit: Payer: Self-pay

## 2023-08-22 ENCOUNTER — Other Ambulatory Visit: Payer: Self-pay

## 2023-08-25 ENCOUNTER — Encounter: Payer: Self-pay | Admitting: Oncology

## 2023-08-25 ENCOUNTER — Telehealth: Payer: Self-pay

## 2023-08-25 NOTE — Telephone Encounter (Signed)
Called patient and notified her of the message

## 2023-08-25 NOTE — Telephone Encounter (Signed)
-----   Message from [name] sent at 08/25/2023  6:50 AM EDT ----- Regarding: call Tell her CT scan is stable, bone areas look the same and I can barely see a tiny residual spot in the liver. All remains under control

## 2023-08-30 ENCOUNTER — Encounter: Payer: Self-pay | Admitting: Oncology

## 2023-09-02 ENCOUNTER — Encounter: Payer: Self-pay | Admitting: Oncology

## 2023-09-07 ENCOUNTER — Encounter: Payer: Self-pay | Admitting: Hematology and Oncology

## 2023-09-07 ENCOUNTER — Encounter: Payer: Self-pay | Admitting: Oncology

## 2023-09-07 ENCOUNTER — Inpatient Hospital Stay

## 2023-09-07 ENCOUNTER — Inpatient Hospital Stay (HOSPITAL_BASED_OUTPATIENT_CLINIC_OR_DEPARTMENT_OTHER): Admitting: Hematology and Oncology

## 2023-09-07 VITALS — BP 160/79 | HR 70 | Temp 98.1°F | Resp 16 | Ht 60.25 in | Wt 109.7 lb

## 2023-09-07 DIAGNOSIS — C7951 Secondary malignant neoplasm of bone: Secondary | ICD-10-CM

## 2023-09-07 DIAGNOSIS — C787 Secondary malignant neoplasm of liver and intrahepatic bile duct: Secondary | ICD-10-CM

## 2023-09-07 DIAGNOSIS — C50412 Malignant neoplasm of upper-outer quadrant of left female breast: Secondary | ICD-10-CM

## 2023-09-07 DIAGNOSIS — C50912 Malignant neoplasm of unspecified site of left female breast: Secondary | ICD-10-CM | POA: Diagnosis not present

## 2023-09-07 DIAGNOSIS — C7952 Secondary malignant neoplasm of bone marrow: Secondary | ICD-10-CM

## 2023-09-07 DIAGNOSIS — G629 Polyneuropathy, unspecified: Secondary | ICD-10-CM

## 2023-09-07 DIAGNOSIS — Z171 Estrogen receptor negative status [ER-]: Secondary | ICD-10-CM

## 2023-09-07 DIAGNOSIS — Z5112 Encounter for antineoplastic immunotherapy: Secondary | ICD-10-CM | POA: Diagnosis not present

## 2023-09-07 DIAGNOSIS — C7989 Secondary malignant neoplasm of other specified sites: Secondary | ICD-10-CM

## 2023-09-07 LAB — CMP (CANCER CENTER ONLY)
ALT: 17 U/L (ref 0–44)
AST: 43 U/L — ABNORMAL HIGH (ref 15–41)
Albumin: 3.9 g/dL (ref 3.5–5.0)
Alkaline Phosphatase: 64 U/L (ref 38–126)
Anion gap: 9 (ref 5–15)
BUN: 15 mg/dL (ref 8–23)
CO2: 27 mmol/L (ref 22–32)
Calcium: 10.7 mg/dL — ABNORMAL HIGH (ref 8.9–10.3)
Chloride: 105 mmol/L (ref 98–111)
Creatinine: 0.88 mg/dL (ref 0.44–1.00)
GFR, Estimated: >60 mL/min (ref >60)
Glucose, Bld: 97 mg/dL (ref 70–99)
Potassium: 4.4 mmol/L (ref 3.5–5.1)
Sodium: 140 mmol/L (ref 135–145)
Total Bilirubin: 0.4 mg/dL (ref 0.0–1.2)
Total Protein: 6.6 g/dL (ref 6.5–8.1)

## 2023-09-07 LAB — CBC WITH DIFFERENTIAL (CANCER CENTER ONLY)
Abs Immature Granulocytes: 0 10*3/uL (ref 0.00–0.07)
Basophils Absolute: 0 10*3/uL (ref 0.0–0.1)
Basophils Relative: 1 %
Eosinophils Absolute: 0.1 10*3/uL (ref 0.0–0.5)
Eosinophils Relative: 1 %
HCT: 38 % (ref 36.0–46.0)
Hemoglobin: 12.8 g/dL (ref 12.0–15.0)
Immature Granulocytes: 0 %
Lymphocytes Relative: 38 %
Lymphs Abs: 1.6 10*3/uL (ref 0.7–4.0)
MCH: 34.8 pg — ABNORMAL HIGH (ref 26.0–34.0)
MCHC: 33.7 g/dL (ref 30.0–36.0)
MCV: 103.3 fL — ABNORMAL HIGH (ref 80.0–100.0)
Monocytes Absolute: 0.5 10*3/uL (ref 0.1–1.0)
Monocytes Relative: 13 %
Neutro Abs: 2.1 10*3/uL (ref 1.7–7.7)
Neutrophils Relative %: 47 %
Platelet Count: 165 10*3/uL (ref 150–400)
RBC: 3.68 MIL/uL — ABNORMAL LOW (ref 3.87–5.11)
RDW: 13.8 % (ref 11.5–15.5)
WBC Count: 4.3 10*3/uL (ref 4.0–10.5)
nRBC: 0 % (ref 0.0–0.2)
nRBC: 0 /100{WBCs}

## 2023-09-07 MED ORDER — DIPHENHYDRAMINE HCL 25 MG PO CAPS
25.0000 mg | ORAL_CAPSULE | Freq: Once | ORAL | Status: AC
  Administered 2023-09-07: 25 mg via ORAL
  Filled 2023-09-07: qty 1

## 2023-09-07 MED ORDER — PALONOSETRON HCL INJECTION 0.25 MG/5ML
0.2500 mg | Freq: Once | INTRAVENOUS | Status: AC
  Administered 2023-09-07: 0.25 mg via INTRAVENOUS
  Filled 2023-09-07: qty 5

## 2023-09-07 MED ORDER — DEXTROSE 5 % IV SOLN: Freq: Once | INTRAVENOUS | Status: DC

## 2023-09-07 MED ORDER — SODIUM CHLORIDE 0.9% FLUSH: 10.0000 mL | INTRAVENOUS | Status: DC | PRN

## 2023-09-07 MED ORDER — DEXAMETHASONE SODIUM PHOSPHATE 10 MG/ML IJ SOLN
10.0000 mg | Freq: Once | INTRAMUSCULAR | Status: AC
  Administered 2023-09-07: 10 mg via INTRAVENOUS
  Filled 2023-09-07: qty 1

## 2023-09-07 MED ORDER — ACETAMINOPHEN 325 MG PO TABS
650.0000 mg | ORAL_TABLET | Freq: Once | ORAL | Status: AC
  Administered 2023-09-07: 650 mg via ORAL
  Filled 2023-09-07: qty 2

## 2023-09-07 MED ORDER — HEPARIN SOD (PORK) LOCK FLUSH 100 UNIT/ML IV SOLN: 500.0000 [IU] | Freq: Once | INTRAVENOUS | Status: DC | PRN

## 2023-09-07 MED ORDER — FAM-TRASTUZUMAB DERUXTECAN-NXKI CHEMO 100 MG IV SOLR
4.0500 mg/kg | Freq: Once | INTRAVENOUS | Status: AC
  Administered 2023-09-07: 200 mg via INTRAVENOUS
  Filled 2023-09-07: qty 10

## 2023-09-07 NOTE — Patient Instructions (Signed)
 Fam-Trastuzumab Deruxtecan Injection What is this medication? FAM-TRASTUZUMAB DERUXTECAN (fam-tras TOOZ eu mab DER ux TEE kan) treats some types of cancer. It works by blocking a protein that causes cancer cells to grow and multiply. This helps to slow or stop the spread of cancer cells. This medicine may be used for other purposes; ask your health care provider or pharmacist if you have questions. COMMON BRAND NAME(S): ENHERTU What should I tell my care team before I take this medication? They need to know if you have any of these conditions: Heart disease Heart failure Infection, especially a viral infection, such as chickenpox, cold sores, or herpes Liver disease Lung or breathing disease, such as asthma or COPD An unusual or allergic reaction to fam-trastuzumab deruxtecan, other medications, foods, dyes, or preservatives Pregnant or trying to get pregnant Breast-feeding How should I use this medication? This medication is injected into a vein. It is given by your care team in a hospital or clinic setting. A special MedGuide will be given to you before each treatment. Be sure to read this information carefully each time. Talk to your care team about the use of this medication in children. Special care may be needed. Overdosage: If you think you have taken too much of this medicine contact a poison control center or emergency room at once. NOTE: This medicine is only for you. Do not share this medicine with others. What if I miss a dose? It is important not to miss your dose. Call your care team if you are unable to keep an appointment. What may interact with this medication? Interactions are not expected. This list may not describe all possible interactions. Give your health care provider a list of all the medicines, herbs, non-prescription drugs, or dietary supplements you use. Also tell them if you smoke, drink alcohol, or use illegal drugs. Some items may interact with your  medicine. What should I watch for while using this medication? Visit your care team for regular checks on your progress. Tell your care team if your symptoms do not start to get better or if they get worse. This medication may increase your risk of getting an infection. Call your care team for advice if you get a fever, chills, sore throat, or other symptoms of a cold or flu. Do not treat yourself. Try to avoid being around people who are sick. Avoid taking medications that contain aspirin, acetaminophen, ibuprofen, naproxen, or ketoprofen unless instructed by your care team. These medications may hide a fever. Be careful brushing or flossing your teeth or using a toothpick because you may get an infection or bleed more easily. If you have any dental work done, tell your dentist you are receiving this medication. This medication may cause dry eyes and blurred vision. If you wear contact lenses, you may feel some discomfort. Lubricating eye drops may help. See your care team if the problem does not go away or is severe. Talk to your care team if you may be pregnant. Serious birth defects can occur if you take this medication during pregnancy and for 7 months after the last dose. If your partner can get pregnant, use a condom during sex while taking this medication and for 4 months after the last dose. Do not breastfeed while taking this medication and for 7 months after the last dose. This medication may cause infertility. Talk to your care team if you are concerned about your fertility. What side effects may I notice from receiving this medication? Side effects  that you should report to your care team as soon as possible: Allergic reactions--skin rash, itching, hives, swelling of the face, lips, tongue, or throat Dry cough, shortness of breath or trouble breathing Infection--fever, chills, cough, sore throat, wounds that don't heal, pain or trouble when passing urine, general feeling of discomfort or  being unwell Heart failure--shortness of breath, swelling of the ankles, feet, or hands, sudden weight gain, unusual weakness or fatigue Unusual bruising or bleeding Side effects that usually do not require medical attention (report these to your care team if they continue or are bothersome): Constipation Diarrhea Hair loss Muscle pain Nausea Vomiting This list may not describe all possible side effects. Call your doctor for medical advice about side effects. You may report side effects to FDA at 1-800-FDA-1088. Where should I keep my medication? This medication is given in a hospital or clinic. It will not be stored at home. NOTE: This sheet is a summary. It may not cover all possible information. If you have questions about this medicine, talk to your doctor, pharmacist, or health care provider.  2024 Elsevier/Gold Standard (2022-12-24 00:00:00)

## 2023-09-07 NOTE — Progress Notes (Signed)
 [facility]  [address] [place],  [place]  [address] [phone]  Clinic Day:  09/07/2023  Referring physician: Alfrieda Antes, MD  ASSESSMENT & PLAN:   Assessment & Plan: Malignant neoplasm metastatic to liver [facility]) Numerous heterogeneously enhancing, internally necrotic lesions in the right hepatic lobe CT in December 2023, which was consistent with metastatic disease. Largest lesion measures up to 6.7 cm and there are at least 6 lesions.  Liver biopsy was consistent with metastatic breast cancer with negative estrogen and progesterone receptors and positive HER2 receptor. She was initially treated with trastuzumab  with an excellent response with significant decrease in the size of her liver lesions and sustained response. She later had progression of this disease in the chest wall and was switched to Enhertu .  CT chest, abdomen, and pelvis in March revealed stable subtle liver lesions with at least 1 lesion is no longer visualized.   CT chest, abdomen and pelvis in October 2024 revealed a decrease in the hepatic lesions with no new lesions seen.  CT imaging in April was fairly stable except a slight increase in the left adrenal nodule. There was a stable vague liver lesion and stable bone metatasis. She continues Enhertu  every 3 weeks without significant difficulty.  Although, she continues to complain bitterly of excessive tearing.  Her PCP recommended she try Pataday, an antihistamine eye drop, and I recommended she give it a try. She will proceed with a 29th cycle of Enhertu  today.  I will plan to see her back in 3 weeks with a CBC, comprehensive metabolic panel and CT chest, abdomen and pelvis to reassess her disease baseline prior to a 30th cycle.  Malignant neoplasm metastatic to bone [facility]) Bone metastases diagnosed in December 2022, for which she was placed on zoledronic  acid every 4 weeks. She is now receiving zoledronic  acid every 6 weeks to correspond with her  chemotherapy.  She has borderline hypercalcemia.  CT imaging in April 2025 did not reveal any evidence of progressive disease.   Chest wall recurrence of breast cancer, left ([name]) Left chest wall recurrence diagnosed in January 2023.  Biopsy revealed carcinoma consistent with her previous breast cancer with negative estrogen and progesterone receptors and positive HER2 receptor.  She was initially treated with docetaxel /trastuzumab  and received 10 cycles with a good response.  She then had recurrence in the left chest wall in September 2023, so was switched to Enhertu  good response.  There is no recurrence in the left chest wall.  Malignant neoplasm of upper-outer quadrant of left breast in female, estrogen receptor negative ([name]) History of stage IIB (T2c N1 M0) HER2 receptor positive, ER/PR negative, left breast cancer diagnosed in June 2020.  She was treated with neoadjuvant Kadcyla /Perjeta  followed by left mastectomy.  She had a complete response in breast and nodes. She had only received two doses of adjuvant trastuzumab  before discontinuing in December 2020. She developed recurrent disease in 2022  Polyneuropathy She has had chronic left hand pain, initially thought to be related to lymphedema.  Her lymphedema was controlled. She has decreased strength and muscle wasting.  She was referred to neurology for further evaluation.  EMG revealed polyneuropathy.  She has now seen orthopedics and had an MRI of the left brachial plexus that was unremarkable, but limited due to patient motion.  She had been on gabapentin  100 mg at bedtime, but discontinued that on her own. She saw Dr. Eugene Hertz in neurology in October 2024 and underwent repeat EMG, which  was more abnormal. She has been seen by Dr. Aloha Arnold with [name] in [place], who did not feel surgery was a good option. The patient no longer sees occupational therapy, but does exercises at home.    The patient understands the plans discussed today and is  in agreement with them.  She knows to contact our office if she develops concerns prior to her next appointment.   I provided 30 minutes of face-to-face time during this encounter and > 50% was spent counseling as documented under my assessment and plan.    Tosca Pletz A Jailynn Lavalais, PA-C  [facility] [place] - [facility] DEPT OF [name] [address] [place] [address] Dept: [phone] Dept Fax: [phone]   [name].     CHIEF COMPLAINT:  CC: Metastatic HER2 receptor positive breast cancer  Current Treatment: Enhertu  every 3 weeks/Zometa  every 6 weeks  HISTORY OF PRESENT ILLNESS:   Oncology History Overview Note  Cancer Staging Malignant neoplasm of upper-outer quadrant of left breast in female, estrogen receptor negative ([name]) Staging form: Breast, AJCC 8th Edition - Clinical stage from 10/11/2018: Stage IIB (cT2, cN1, cM0, G3, ER-, PR-, HER2+) - Signed by Sonja Chatham, MD on 11/02/2018 - Clinical: No stage assigned - Unsigned    History of colon cancer, stage II  09/2005 Initial Diagnosis   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 Findings were a 5.9 x 3.8 x 1.2 cm moderate to well-differentiated adenocarcinoma penetrating the muscular wall. Forty lymph nodes negative. No vascular or lymphatic invasion. Multiple diverticula with microabscess formation and inflammation. Carcinoma present in at least 1 diverticulum. Preop CEA 5.2 with lab normal range 0 to 2.5. Preop CT scan with no obvious additional pathology.    2007 -  Chemotherapy   She received oral Xeloda chemotherapy as an adjuvant. She declined treatment on a clinical trial.    11/12/2011 Initial Diagnosis   [name], stage II   12/09/2011 Procedure   Followup colonoscopy done on 12/09/2011. She was found to have 2 polyps which  were removed. Chronic diverticulosis.     Malignant neoplasm of upper-outer quadrant of left breast in female, estrogen receptor negative ([name])  09/26/2018 Mammogram   Mammogram/US  of left breast 09/26/18 IMPRESSION:  1. 2.9cm irregular mass in the upper outer left breast corresponds to the palpable abnormality. This is highly suspicious for breast carcinoma.  2. Two adjacent abnormal left axillary  Lymph nodes suspicious for metastatic adenopathy. There is a third borderline abnormal left axillary LN with a cortex thickened to 4mm.  3. Benign right breast cyst. No evidence of right breast malignancy.    10/11/2018 Cancer Staging   Staging form: Breast, AJCC 8th Edition - Clinical stage from 10/11/2018: Stage IIB (cT2, cN1, cM0, G3, ER-, PR-, HER2+) - Signed by Sonja [place], MD on 11/02/2018   10/11/2018 Initial Biopsy   Diagnosis 10/11/18 1. Breast, left, needle core biopsy, upper outer left 2 o'clock - INVASIVE DUCTAL CARCINOMA. - DUCTAL CARCINOMA IN SITU. - LYMPHOVASCULAR INVASION IS IDENTIFIED. - SEE COMMENT. 2. Lymph node, needle/core biopsy, left axilla - INVASIVE DUCTAL CARCINOMA. - SEE COMMENT.   10/11/2018 Receptors her2   The tumor cells are POSITIVE for Her2 (3+). Estrogen Receptor: 0%, NEGATIVE Progesterone Receptor: 0%, NEGATIVE Proliferation Marker Ki67: 70%   11/02/2018 Initial Diagnosis  Malignant neoplasm of upper-outer quadrant of left breast in female, estrogen receptor negative ([name])   11/15/2018 Breast MRI   MRI breast 11/15/18  IMPRESSION: 1. 3.3 centimeter mass in the LATERAL portion of the LEFT breast consistent with known malignancy. 2. There is significant non mass enhancement surrounding this mass and extending anteriorly into the nipple base, with largest diameter in the anterior to posterior axis, measuring 7.2 centimeters. 3. If the patient would consider breast conservation, additional MR guided core biopsies are recommended. Consider biopsy of the inferior and  anterior extent of the non mass enhancement to document extent of disease. 4. Three enlarged LEFT axillary lymph nodes. 5. RIGHT breast is negative.   11/15/2018 PET scan   PET 11/15/18 IMPRESSION: Hypermetabolic left breast lesion compatible with known primary. Hypermetabolic left axillary lymph nodes are consistent with metastatic disease.   No evidence for additional hypermetabolic metastatic involvement in the neck, chest, abdomen, or pelvis.   11/17/2018 - 03/01/2019 Chemotherapy   Neo-adjuvant Kadcyla  and perjeta  q3weeks for 6 cycles starting 11/17/18. Stopped before surgery    11/29/2018 Pathology Results   Diagnosis 1. Breast, left, needle core biopsy, inferior anterior (cylinder clip) - INVASIVE DUCTAL CARCINOMA. - DUCTAL CARCINOMA IN SITU. - LYMPHOVASCULAR INVASION IS IDENTIFIED. - SEE COMMENT. 2. Breast, left, needle core biopsy, central posterior (barbell clip) - INVASIVE DUCTAL CARCINOMA. - LYMPHOVASCULAR INVASION IS IDENTIFIED.   02/12/2019 Breast MRI   IMPRESSION: 1. Complete resolution of previously identified enhancing mass and associated non mass enhancement within the left breast. This is consistent with excellent response to chemotherapy. No residual or suspicious findings are identified. 2. No MRI evidence of malignancy on the right. 3. Previously identified left axillary lymphadenopathy not definitively seen on today's study. However, this may be due to decreased field-of-view compared to prior study.     03/14/2019 Cancer Staging   Staging form: Breast, AJCC 8th Edition - Pathologic stage from 03/14/2019: pT0, pN0, cM0, GX, ER: Unknown, PR: Unknown, HER2: Not Assessed - Signed by Sonja [place], MD on 03/28/2019   03/14/2019 Surgery   LEFT MASTECTOMY WITH TARGETED LYMPH NODE DISSECTION and Left Axillary Sentinel Lymph  Node Biopsy by Dr Alethea Andes 03/14/19    03/14/2019 Pathology Results   FINAL MICROSCOPIC DIAGNOSIS:   A. LYMPH NODE, LEFT, SENTINEL, BIOPSY:  -  There is no evidence of carcinoma in 1 of 1 lymph node (0/1).   B. BREAST, LEFT, MASTECTOMY:  - Benign breast parenchyma with treatment-related changes.  - There is no evidence of malignancy.  - See oncology table below.   C. LYMPH NODE, LEFT #1, SENTINEL, BIOPSY:  - There is no evidence of carcinoma in 1 of 1 lymph node (0/1).   D. LYMPH NODE, LEFT #2, SENTINEL, BIOPSY:  - There is no evidence of carcinoma in 1 of 1 lymph node (0/1).     04/04/2019 - 04/25/2019 Chemotherapy   Maintenance Herceptin  injections every 3 weeks starting 04/03/19 to complete 1 year of treatment that was started in 11/2018. Stopped after 2nd dose as she will not be repeating Echos.    06/03/2021 - 06/24/2021 Chemotherapy   Patient is on Treatment Plan : BREAST Trastuzumab  q21d X 11 Cycles     06/25/2021 - 01/01/2022 Chemotherapy   Patient is on Treatment Plan : BREAST Docetaxel  + Trastuzumab  + Pertuzumab  (THP) q21d     06/25/2021 - 01/02/2022 Chemotherapy   Patient is on Treatment Plan : BREAST Docetaxel  + Trastuzumab  + Pertuzumab  (THP) q21d     09/14/2021 Imaging  CT chest/abdomen/pelvis  IMPRESSION:  1. Interval resolution of previously seen bulky left axillary and  subpectoral lymphadenopathy. No persistently enlarged lymph nodes.  2. Multiple liver lesions are significantly diminished in size.  3. Widespread osseous metastatic disease, with an interval increase  in sclerosis of several previously lytic lesions.  4. Constellation of findings is consistent with treatment response  of metastatic disease  5. New, although age indeterminate pathologic wedge deformity of the  T6 vertebral body as well as an increased pathologic wedge deformity  of the L2 vertebral body.  6. Coronary artery disease.  Aortic Atherosclerosis (ICD10-I70.0).    01/26/2022 -  Chemotherapy   Patient is on Treatment Plan : BREAST METASTATIC Fam-Trastuzumab Deruxtecan-nxki  (Enhertu ) (5.4) q21d     Malignant neoplasm metastatic to  liver ([name])  04/17/2021 Imaging   CT ABDOMEN AND PELVIS WITH CONTRAST: -New lytic lesion involving the L2 vertebral body (6:72, 2:26) with minimal cortical breakthrough superiorly, but with preservation of vertebral body height, concerning for metastatic disease.  -There are several indeterminate hepatic lesions as described above. In addition to the lesions described above, there is an additional subtle 1.5 cm hypodensity in the hepatic dome (2:7). Given clinical history and presence of a new L2 lesion, leading differential consideration is metastatic disease. Recommend further evaluation with MRI of the abdomen with and without contrast.   05/12/2021 Imaging   MRI ABDOMEN WITH AND WITHOUT CONTRAST: Numerous heterogeneously enhancing, internally necrotic lesions in the right hepatic lobe, highly concerning for metastatic disease. Largest lesion measures up to 6.7 cm in hepatic segment VI.   Enhancing osseous lesions in the L2 vertebral body, both iliac bones, and in the sacrum, highly concerning for osseous metastases.   05/15/2021 Initial Diagnosis   Liver metastases ([name])   05/27/2021 PET scan   1. Widespread recurrent/metastatic disease in this patient who is  status post bilateral mastectomy. Left chest wall recurrence with  left axillary/subpectoral, supraclavicular nodal metastasis.  2. Hepatic, left adrenal, and widespread osseous metastasis.  3. Incidental findings, including: Right nephrolithiasis. Tiny  hiatal hernia.    05/27/2021 Imaging   MRI LUMBAR AND THORACIC SPINE: Thoracic spine:  Osseous metastatic disease involving each level. The most dramatic deposits are at T4 and T6 where there is extraosseous/epidural tumor. No cord compression but there is foraminal impingement on the left at T6-7 and on the right at T3-4. Early dorsal epidural tumor at the level of T10 and T3.   Lumbar spine:  1. Widespread osseous metastatic disease with largest deposit  replacing the L2 body where  there is a compression fracture with  mild height loss. Also at this level is early epidural tumor  extension likely affecting both L2-3 foramina.  2. 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    05/27/2021 Imaging   MRI HEAD WITH AND WITHOUT CONTRAST: Negative for metastatic disease to the brain or calvarium.   06/03/2021 - 06/24/2021 Chemotherapy   Patient is on Treatment Plan : BREAST Trastuzumab  q21d X 11 Cycles     06/25/2021 - 01/01/2022 Chemotherapy   Patient is on Treatment Plan : BREAST Docetaxel  + Trastuzumab  + Pertuzumab  (THP) q21d     06/25/2021 - 01/02/2022 Chemotherapy   Patient is on Treatment Plan : BREAST Docetaxel  + Trastuzumab  + Pertuzumab  (THP) q21d     09/14/2021 Imaging   CT chest/abdomen/pelvis  IMPRESSION:  1. Interval resolution of previously seen bulky left axillary and  subpectoral lymphadenopathy. No persistently enlarged lymph nodes.  2. Multiple liver  lesions are significantly diminished in size.  3. Widespread osseous metastatic disease, with an interval increase  in sclerosis of several previously lytic lesions.  4. Constellation of findings is consistent with treatment response  of metastatic disease  5. New, although age indeterminate pathologic wedge deformity of the  T6 vertebral body as well as an increased pathologic wedge deformity  of the L2 vertebral body.  6. Coronary artery disease.  Aortic Atherosclerosis (ICD10-I70.0).    01/26/2022 -  Chemotherapy   Patient is on Treatment Plan : BREAST METASTATIC Fam-Trastuzumab Deruxtecan-nxki  (Enhertu ) (5.4) q21d     Malignant neoplasm metastatic to bone ([name])  04/17/2021 Imaging   CT ABDOMEN AND PELVIS WITH CONTRAST: -New lytic lesion involving the L2 vertebral body (6:72, 2:26) with minimal cortical breakthrough superiorly, but with preservation of vertebral body height, concerning for metastatic disease.  -There are several indeterminate hepatic lesions as described above.  In addition to the lesions described above, there is an additional subtle 1.5 cm hypodensity in the hepatic dome (2:7). Given clinical history and presence of a new L2 lesion, leading differential consideration is metastatic disease. Recommend further evaluation with MRI of the abdomen with and without contrast.   05/12/2021 Imaging   MRI ABDOMEN WITH AND WITHOUT CONTRAST: Numerous heterogeneously enhancing, internally necrotic lesions in the right hepatic lobe, highly concerning for metastatic disease. Largest lesion measures up to 6.7 cm in hepatic segment VI.   Enhancing osseous lesions in the L2 vertebral body, both iliac bones, and in the sacrum, highly concerning for osseous metastases.   05/15/2021 Initial Diagnosis   Bone metastases ([name])   05/27/2021 PET scan   1. Widespread recurrent/metastatic disease in this patient who is  status post bilateral mastectomy. Left chest wall recurrence with  left axillary/subpectoral, supraclavicular nodal metastasis.  2. Hepatic, left adrenal, and widespread osseous metastasis.  3. Incidental findings, including: Right nephrolithiasis. Tiny  hiatal hernia.    05/27/2021 Imaging   MRI LUMBAR AND THORACIC SPINE: Thoracic spine:  Osseous metastatic disease involving each level. The most dramatic deposits are at T4 and T6 where there is extraosseous/epidural tumor. No cord compression but there is foraminal impingement on the left at T6-7 and on the right at T3-4. Early dorsal epidural tumor at the level of T10 and T3.   Lumbar spine:  1. Widespread osseous metastatic disease with largest deposit  replacing the L2 body where there is a compression fracture with  mild height loss. Also at this level is early epidural tumor  extension likely affecting both L2-3 foramina.  2. 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    05/27/2021 Imaging   MRI HEAD WITH AND WITHOUT CONTRAST: Negative for metastatic disease to the brain or  calvarium.   06/03/2021 - 06/24/2021 Chemotherapy   Patient is on Treatment Plan : BREAST Trastuzumab  q21d X 11 Cycles     06/25/2021 - 01/01/2022 Chemotherapy   Patient is on Treatment Plan : BREAST Docetaxel  + Trastuzumab  + Pertuzumab  (THP) q21d     06/25/2021 - 01/02/2022 Chemotherapy   Patient is on Treatment Plan : BREAST Docetaxel  + Trastuzumab  + Pertuzumab  (THP) q21d     09/14/2021 Imaging   CT chest/abdomen/pelvis  IMPRESSION:  1. Interval resolution of previously seen bulky left axillary and  subpectoral lymphadenopathy. No persistently enlarged lymph nodes.  2. Multiple liver lesions are significantly diminished in size.  3. Widespread osseous metastatic disease, with an interval increase  in sclerosis of several previously lytic lesions.  4. Constellation  of findings is consistent with treatment response  of metastatic disease  5. New, although age indeterminate pathologic wedge deformity of the  T6 vertebral body as well as an increased pathologic wedge deformity  of the L2 vertebral body.  6. Coronary artery disease.  Aortic Atherosclerosis (ICD10-I70.0).    01/26/2022 -  Chemotherapy   Patient is on Treatment Plan : BREAST METASTATIC Fam-Trastuzumab Deruxtecan-nxki  (Enhertu ) (5.4) q21d         INTERVAL HISTORY:  [place] is here today for repeat clinical assessment.  She continues to report excessive tearing.  Her PCP recommended she try Pataday eyedrops.  She continues to deal with arthritis of both hands as well as atrophy and weakness of the left hand.  She reports intermittent tingling and numbness in the left hand, but has not resumed gabapentin , as this is not severe.  She is otherwise doing fairly well.  She denies fevers or chills. She denies pain. Her appetite is fairly good. Her weight has decreased 1 pounds over last 3 weeks .  She continues to struggle with trying to care for her husband, as well as her self.  She states her son helps especially with  cooking.  REVIEW OF SYSTEMS:  Review of Systems  Constitutional:  Negative for appetite change, chills, fatigue, fever and unexpected weight change.  HENT:   Negative for lump/mass, mouth sores and sore throat.   Eyes:  Positive for eye problems (excessive tearing).  Respiratory:  Negative for cough and shortness of breath.   Cardiovascular:  Negative for chest pain and leg swelling.  Gastrointestinal:  Negative for abdominal pain (bilateral hands), constipation, diarrhea, nausea and vomiting.  Endocrine: Negative for hot flashes.  Genitourinary:  Negative for difficulty urinating, dysuria, frequency and hematuria.   Musculoskeletal:  Positive for arthralgias and gait problem (ambulates with a cane). Negative for back pain, myalgias and neck pain.  Skin:  Negative for itching and rash.  Neurological:  Positive for extremity weakness (left hand), gait problem (ambulates with a cane) and numbness (left hand). Negative for dizziness, headaches and light-headedness.  Hematological:  Negative for adenopathy. Does not bruise/bleed easily.  Psychiatric/Behavioral:  Negative for depression and sleep disturbance. The patient is not nervous/anxious.      VITALS:  Blood pressure (!) 160/79, pulse 70, temperature 98.1 F (36.7 C), temperature source Oral, resp. rate 16, height 5' 0.25" (1.53 m), weight 109 lb 11.2 oz (49.8 kg), SpO2 97%.  Wt Readings from Last 3 Encounters:  09/07/23 109 lb 11.2 oz (49.8 kg)  08/17/23 110 lb 4.8 oz (50 kg)  07/27/23 112 lb 11.2 oz (51.1 kg)    Body mass index is 21.25 kg/m.  Performance status (ECOG): 2 - Symptomatic, <50% confined to bed  PHYSICAL EXAM:  Physical Exam Vitals and nursing note reviewed.  Constitutional:      General: She is not in acute distress.    Appearance: Normal appearance.  HENT:     Head: Normocephalic and atraumatic.     Mouth/Throat:     Mouth: Mucous membranes are moist.     Pharynx: Oropharynx is clear. No oropharyngeal  exudate or posterior oropharyngeal erythema.  Eyes:     General: No scleral icterus.    Extraocular Movements: Extraocular movements intact.     Conjunctiva/sclera: Conjunctivae normal.     Pupils: Pupils are equal, round, and reactive to light.  Cardiovascular:     Rate and Rhythm: Normal rate and regular rhythm.     Heart sounds: Normal heart  sounds. No murmur heard.    No friction rub. No gallop.  Pulmonary:     Effort: Pulmonary effort is normal.     Breath sounds: Normal breath sounds. No wheezing, rhonchi or rales.  Chest:  Breasts:    Right: Normal. No swelling, bleeding, inverted nipple, mass, nipple discharge, skin change or tenderness.     Left: Absent.     Comments: Left mastectomy site is negative. Abdominal:     General: There is no distension.     Palpations: Abdomen is soft. There is no hepatomegaly, splenomegaly or mass.     Tenderness: There is no abdominal tenderness.  Musculoskeletal:        General: Normal range of motion.     Cervical back: Normal range of motion and neck supple. No tenderness.     Right lower leg: No edema.     Left lower leg: No edema.  Lymphadenopathy:     Cervical: No cervical adenopathy.     Upper Body:     Right upper body: No supraclavicular or axillary adenopathy.     Left upper body: No supraclavicular or axillary adenopathy.     Lower Body: No right inguinal adenopathy. No left inguinal adenopathy.  Skin:    General: Skin is warm and dry.     Coloration: Skin is not jaundiced.     Findings: No rash.  Neurological:     Mental Status: She is alert and oriented to person, place, and time.     Cranial Nerves: No cranial nerve deficit.  Psychiatric:        Mood and Affect: Mood normal.        Behavior: Behavior normal.        Thought Content: Thought content normal.     LABS:      Latest Ref Rng & Units 09/07/2023   12:48 PM 08/17/2023   12:39 PM 07/27/2023   12:57 PM  CBC  WBC 4.0 - 10.5 K/uL 4.3  5.3  4.5   Hemoglobin  12.0 - 15.0 g/dL 16.1  09.6  04.5   Hematocrit 36.0 - 46.0 % 38.0  39.9  37.9   Platelets 150 - 400 K/uL 165  179  169       Latest Ref Rng & Units 09/07/2023   12:48 PM 08/17/2023   12:39 PM 07/27/2023   12:57 PM  CMP  Glucose 70 - 99 mg/dL 97  89  409   BUN 8 - 23 mg/dL 15  21  19    Creatinine 0.44 - 1.00 mg/dL 8.11  9.14  7.82   Sodium 135 - 145 mmol/L 140  142  144   Potassium 3.5 - 5.1 mmol/L 4.4  4.5  4.3   Chloride 98 - 111 mmol/L 105  106  106   CO2 22 - 32 mmol/L 27  26  26    Calcium 8.9 - 10.3 mg/dL 95.6  21.3  08.6   Total Protein 6.5 - 8.1 g/dL 6.6  6.6  6.4   Total Bilirubin 0.0 - 1.2 mg/dL 0.4  0.5  0.4   Alkaline Phos 38 - 126 U/L 64  67  68   AST 15 - 41 U/L 43  45  41   ALT 0 - 44 U/L 17  21  17       Lab Results  Component Value Date   CEA1 1.8 06/03/2021   CEA 0.5 11/13/2012   /  CEA  Date Value Ref Range Status  06/03/2021 1.8 0.0 - 4.7 ng/mL Final    Comment:    (NOTE)                             Nonsmokers          <3.9                             Smokers             <5.6 [employer] Electrochemiluminescence Immunoassay (ECLIA) Values obtained with different assay methods or kits cannot be used interchangeably.  Results cannot be interpreted as absolute evidence of the presence or absence of malignant disease. Performed At: [facility] [address] [place], [place] [id number] Pearlean Botts MD WU:[id number]   11/13/2012 0.5 0.0 - 5.0 ng/mL Final    Lab Results  Component Value Date   LDH 186 11/12/2011   LDH 187 09/08/2010   LDH 171 09/09/2009    STUDIES:  CT CHEST ABDOMEN PELVIS W CONTRAST Result Date: 08/17/2023 CLINICAL DATA:  Assess treatment response. Invasive breast cancer. History of previous left mastectomy. Prior colon resection for colon cancer as well. * Tracking Code: BO * EXAM: CT CHEST, ABDOMEN, AND PELVIS WITH CONTRAST TECHNIQUE: Multidetector CT imaging of the chest, abdomen and pelvis was performed following  the standard protocol during bolus administration of intravenous contrast. RADIATION DOSE REDUCTION: This exam was performed according to the departmental dose-optimization program which includes automated exposure control, adjustment of the mA and/or kV according to patient size and/or use of iterative reconstruction technique. CONTRAST:  OMNIPAQUE  IOHEXOL  300 MG/ML  SOLN COMPARISON:  CT 02/17/2023 and older. FINDINGS: CT CHEST FINDINGS Cardiovascular: Thoracic aorta has a normal course and caliber with scattered partially calcified plaque. Calcified plaque also seen along the great vessels in the coronary arteries. Heart is nonenlarged. Calcifications seen towards the aortic valve as well. There is right IJ catheter, port in place. The port is accessed. Catheter extends to the central SVC near the right atrial junction. Mediastinum/Nodes: Small hiatal hernia. Small cystic foci along the thyroid gland, unchanged. Surgical clips in the left axillary region from prior lymph node dissection. No specific abnormal lymph node enlargement identified in the axillary region, hilum or mediastinum. No nodes supraclavicular, chest wall or internal mammary chains as well. Lungs/Pleura: Lungs are without consolidation, pneumothorax. Most trace left pleural fluid. Mild areas of lower lung bronchiectasis with some scarring atelectatic changes. No dominant lung mass. Cul pleural thickening. Musculoskeletal: Degenerative changes seen of the spine and shoulders. There is a healing fracture of the lateral aspect of the right eighth rib with progressive sclerosis. There is extensive sclerotic bone metastases identified throughout the spine, sternum, ribs. Grossly extent distribution is similar to the prior CT scan. There is slight compression deformity of the vertebral body at L2, T6 which is seen previously. CT ABDOMEN PELVIS FINDINGS Hepatobiliary: Stable segment 2 benign a [place] cystic lesion. There is of a low-density areas  seen on the previous examination in the infra of the measuring 7 mm on the prior examination. Vaguely seen today on image 63 and unchanged. No new space-occupying liver lesion. Underlying fatty liver infiltration. Slightly nodular contour to the liver. Gallbladder is nondilated. Patent portal vein. Pancreas: Unremarkable. No pancreatic ductal dilatation or surrounding inflammatory changes. Spleen: Normal in size without focal abnormality. Adrenals/Urinary Tract: Right adrenal gland is preserved. There is left adrenal nodule identified  measuring 12 by 10 mm today and previously 11 by 7 mm, slightly larger. No enhancing renal mass or collecting system dilatation. The ureters have normal course and caliber extending down to the urinary bladder. Preserved contour to the urinary bladder. Stomach/Bowel: Large bowel is normal course and caliber with scattered colonic stool. Stomach and small bowel are nondilated. Few scattered colonic diverticula. Oral contrast was administered. Vascular/Lymphatic: Aortic atherosclerosis. No enlarged abdominal or pelvic lymph nodes. Reproductive: Uterus and bilateral adnexa are unremarkable. Other: No free air or free fluid. Musculoskeletal: Curvature and degenerative changes along the spine. Degenerative changes in the pelvis. Multifocal areas of bony sclerosis again identified consistent with multifocal osseous metastatic disease. Extent and distribution is similar to previous comparing CT scans. IMPRESSION: Multifocal sclerotic bone metastases are similar to previous. Stable areas of compression deformity at L2 and T6. Separate healing right lateral rib fracture. Left adrenal nodule appears slightly larger today. Attention on follow-up. No developing new lymph node enlargement, soft tissue mass. Fatty liver infiltration with slightly nodular contour. Vague low-attenuation liver lesion on previous is not well seen today. No new liver lesion at this time. Electronically Signed   By: Adrianna Horde M.D.   On: 08/17/2023 14:26      HISTORY:   Past Medical History:  Diagnosis Date   Arthritis    shoulder   Breast cancer ([name])    Cancer ([name])    left breast ca   Colon cancer ([name])    [name], stage II 11/12/2011   Sigmoid lesion 5.9 cm  40 nodes negative but focus of cancer in a diverticum   Pre-op CEA 5.2 with lab normal up to 2.5 resected 09/21/05   Xeloda adjuvant chemotherapy   Hypertension    Polyneuropathy 02/03/2023   Right shoulder pain 04/12/2023    Past Surgical History:  Procedure Laterality Date   COLONOSCOPY     HEMICOLECTOMY  2007   MASTECTOMY WITH AXILLARY LYMPH NODE DISSECTION Left 03/14/2019   Procedure: LEFT MASTECTOMY WITH TARGETED LYMPH NODE DISSECTION;  Surgeon: Caralyn Chandler, MD;  Location: [facility] OR;  Service: General;  Laterality: Left;   SENTINEL NODE BIOPSY Left 03/14/2019   Procedure: Left Axillary Sentinel Lymph  Node Biopsy;  Surgeon: Caralyn Chandler, MD;  Location: [facility] OR;  Service: General;  Laterality: Left;   TONSILLECTOMY     WISDOM TOOTH EXTRACTION      Family History  Problem Relation Age of Onset   Cancer Maternal Aunt        colon cancer     Social History:  reports that she has never smoked. She has never used smokeless tobacco. She reports that she does not drink alcohol and does not use drugs.The patient is alone today.  Allergies: No Known Allergies  Current Medications: Current Outpatient Medications  Medication Sig Dispense Refill   bisacodyl (DULCOLAX) 5 MG EC tablet Take 5 mg by mouth daily as needed for moderate constipation. 2 capsules every night     MAGNESIUM PO Take 1 tablet by mouth every evening. 600 mg     Biotin 1 MG CAPS Take by mouth.     co-enzyme Q-10 30 MG capsule Take 100 mg by mouth daily.     fish oil-omega-3 fatty acids 1000 MG capsule Take 1 g by mouth daily.     gabapentin  (NEURONTIN ) 100 MG capsule Take 1 capsule (100 mg total) by mouth at bedtime. (Patient not taking: Reported on  05/02/2023) 30 capsule 5  Multiple Vitamin (MULTIVITAMIN) capsule Take 1 capsule by mouth daily.     ondansetron  (ZOFRAN ) 4 MG tablet Take 1 tablet (4 mg total) by mouth every 8 (eight) hours as needed for nausea or vomiting. (Patient not taking: Reported on 05/02/2023) 20 tablet 2   polyethylene glycol (MIRALAX / GLYCOLAX) 17 g packet Take 17 g by mouth as needed. constipation     No current facility-administered medications for this visit.

## 2023-09-08 ENCOUNTER — Encounter: Payer: Self-pay | Admitting: Oncology

## 2023-09-08 ENCOUNTER — Other Ambulatory Visit: Payer: Self-pay

## 2023-09-08 ENCOUNTER — Encounter: Payer: Self-pay | Admitting: Hematology and Oncology

## 2023-09-08 NOTE — Assessment & Plan Note (Signed)
 Bone metastases diagnosed in December 2022, for which she was placed on zoledronic  acid every 4 weeks. She is now receiving zoledronic  acid every 6 weeks to correspond with her chemotherapy.  She has borderline hypercalcemia.  CT imaging in April 2025 did not reveal any evidence of progressive disease.

## 2023-09-08 NOTE — Assessment & Plan Note (Signed)
 History of stage IIB (T2c N1 M0) HER2 receptor positive, ER/PR negative, left breast cancer diagnosed in June 2020.  She was treated with neoadjuvant Kadcyla /Perjeta  followed by left mastectomy.  She had a complete response in breast and nodes. She had only received two doses of adjuvant trastuzumab  before discontinuing in December 2020. She developed recurrent disease in 2022

## 2023-09-08 NOTE — Assessment & Plan Note (Signed)
 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  Biopsy revealed carcinoma consistent with her previous breast cancer with negative estrogen and progesterone receptors and positive HER2 receptor.  She was initially treated with docetaxel /trastuzumab  and received 10 cycles with a good response.  She then had recurrence in the left chest wall in September 2023, so was switched to Enhertu  good response.  There is no recurrence in the left chest wall.

## 2023-09-08 NOTE — Assessment & Plan Note (Addendum)
 Numerous heterogeneously enhancing, internally necrotic lesions in the right hepatic lobe CT in December 2023, which was consistent with metastatic disease. Largest lesion measures up to 6.7 cm and there are at least 6 lesions.  Liver biopsy was consistent with metastatic breast cancer with negative estrogen and progesterone receptors and positive HER2 receptor. She was initially treated with trastuzumab  with an excellent response with significant decrease in the size of her liver lesions and sustained response. She later had progression of this disease in the chest wall and was switched to Enhertu .  CT chest, abdomen, and pelvis in March revealed stable subtle liver lesions with at least 1 lesion is no longer visualized.   CT chest, abdomen and pelvis in October 2024 revealed a decrease in the hepatic lesions with no new lesions seen.  CT imaging in April was fairly stable except a slight increase in the left adrenal nodule. There was a stable vague liver lesion and stable bone metatasis. She continues Enhertu  every 3 weeks without significant difficulty.  Although, she continues to complain bitterly of excessive tearing.  Her PCP recommended she try Pataday, an antihistamine eye drop, and I recommended she give it a try. She will proceed with a 29th cycle of Enhertu  today.  I will plan to see her back in 3 weeks with a CBC, comprehensive metabolic panel and CT chest, abdomen and pelvis to reassess her disease baseline prior to a 30th cycle.

## 2023-09-08 NOTE — Assessment & Plan Note (Signed)
 She has had chronic left hand pain, initially thought to be related to lymphedema.  Her lymphedema was controlled. She has decreased strength and muscle wasting.  She was referred to neurology for further evaluation.  EMG revealed polyneuropathy.  She has now seen orthopedics and had an MRI of the left brachial plexus that was unremarkable, but limited due to patient motion.  She had been on gabapentin 100 mg at bedtime, but discontinued that on her own. She saw Dr. Dierdre Searles in neurology in October 2024 and underwent repeat EMG, which was more abnormal. She has been seen by Dr. Amanda Pea with [name] in [place], who did not feel surgery was a good option. The patient no longer sees occupational therapy, but does exercises at home.

## 2023-09-13 ENCOUNTER — Other Ambulatory Visit: Payer: Self-pay

## 2023-09-21 ENCOUNTER — Other Ambulatory Visit: Payer: Self-pay | Admitting: Oncology

## 2023-09-21 DIAGNOSIS — C50412 Malignant neoplasm of upper-outer quadrant of left female breast: Secondary | ICD-10-CM

## 2023-09-21 DIAGNOSIS — Z171 Estrogen receptor negative status [ER-]: Secondary | ICD-10-CM

## 2023-09-21 DIAGNOSIS — C7951 Secondary malignant neoplasm of bone: Secondary | ICD-10-CM

## 2023-09-21 DIAGNOSIS — C787 Secondary malignant neoplasm of liver and intrahepatic bile duct: Secondary | ICD-10-CM

## 2023-09-22 ENCOUNTER — Encounter: Payer: Self-pay | Admitting: Oncology

## 2023-09-27 NOTE — Progress Notes (Signed)
 [facility]  [address] [place],  [place]  [address] [phone]  Clinic Day:  09/28/23  Referring physician: Alfrieda Antes, MD  ASSESSMENT & PLAN:  Assessment: Malignant neoplasm metastatic to liver [facility]) Numerous heterogeneously enhancing, internally necrotic lesions in the right hepatic lobe CT in December 2023, which was consistent with metastatic disease. Largest lesion measures up to 6.7 cm and there are at least 6 lesions.  Liver biopsy was consistent with metastatic breast cancer with negative estrogen and progesterone receptors and positive HER2 receptor. She was initially treated with trastuzumab  with an excellent response with significant decrease in the size of her liver lesions and sustained response. She later had progression of this disease in the chest wall and was switched to Enhertu .  CT chest, abdomen, and pelvis in March revealed stable subtle liver lesions with at least 1 lesion is no longer visualized.    CT chest, abdomen and pelvis in October 2024 revealed a decrease in the hepatic lesions with no new lesions seen.  CT imaging in April was fairly stable except a slight increase in the left adrenal nodule. There was a stable vague liver lesion and stable bone metatasis. She continues Enhertu  every 3 weeks without significant difficulty.  Although, she continues to complain bitterly of excessive tearing.  Her PCP recommended she try Pataday, an antihistamine eye drop, and I recommended she give it a try. She will proceed with a 30th cycle of Enhertu  today.    Malignant neoplasm metastatic to bone [facility]) Bone metastases diagnosed in December 2022, for which she was placed on zoledronic  acid every 4 weeks. She is now receiving zoledronic  acid every 6 weeks to correspond with her chemotherapy.  She has borderline hypercalcemia.  CT imaging in April 2025 did not reveal any evidence of progressive disease.    Chest wall recurrence of breast cancer, left ([name]) Left  chest wall recurrence diagnosed in January 2023.  Biopsy revealed carcinoma consistent with her previous breast cancer with negative estrogen and progesterone receptors and positive HER2 receptor.  She was initially treated with docetaxel /trastuzumab  and received 10 cycles with a good response.  She then had recurrence in the left chest wall in September 2023, so was switched to Enhertu  good response.  There is no recurrence in the left chest wall.   Malignant neoplasm of upper-outer quadrant of left breast in female, estrogen receptor negative ([name]) History of stage IIB (T2c N1 M0) HER2 receptor positive, ER/PR negative, left breast cancer diagnosed in June 2020.  She was treated with neoadjuvant Kadcyla /Perjeta  followed by left mastectomy.  She had a complete response in breast and nodes. She had only received two doses of adjuvant trastuzumab  before discontinuing in December 2020. She developed recurrent disease in 2022   Polyneuropathy She has had chronic left hand pain, initially thought to be related to lymphedema.  Her lymphedema was controlled. She has decreased strength and muscle wasting.  She was referred to neurology for further evaluation.  EMG revealed polyneuropathy.  She has now seen orthopedics and had an MRI of the left brachial plexus that was unremarkable, but limited due to patient motion.  She had been on gabapentin  100 mg at bedtime, but discontinued that on her own. She saw Dr. Eugene Lewis in neurology in October 2024 and underwent repeat EMG, which was more abnormal. She has been seen by Dr. Aloha Lewis with [name] in [place], who did not feel surgery was a good option. The patient no longer sees occupational therapy, but  does exercises at home.   Plan: CT chest, abdomen, and pelvis done on 08/11/2023 revealed multifocal sclerotic bone metastases are similar to previous, stable areas of compression deformity at L2 and T6, separate healing right lateral rib fracture, and the left adrenal  nodule appeared slightly larger. There was no developing new lymph node enlargement, fatty liver infiltration with slightly nodular contour, vague low-attenuation liver lesion on previous was not well seen, and no new liver lesion at this time was also noted. I reviewed this report with her and her son once again. Her day 1 cycle 30 of Enhertu  is scheduled on 09/28/2023. She has a low WBC of 3.9, hemoglobin of 12.8, and platelet count of 161,000. Her CMP is normal. I will see her back in 3 weeks with CBC and CMP. The patient understands the plans discussed today and is in agreement with them.  She knows to contact our office if she develops concerns prior to her next appointment.  I provided 18 minutes of face-to-face time during this encounter and > 50% was spent counseling as documented under my assessment and plan.   Jasmin Baumgartner, MD   [facility] OF [facility] [address] [place] [address] Dept: [phone] Dept Fax: [phone]   [name].    CHIEF COMPLAINT:  CC: Metastatic HER2 receptor positive breast cancer  Current Treatment: Enhertu  every 3 weeks/Zometa  every 6 weeks  HISTORY OF PRESENT ILLNESS:   Oncology History Overview Note  Cancer Staging Malignant neoplasm of upper-outer quadrant of left breast in female, estrogen receptor negative ([name]) Staging form: Breast, AJCC 8th Edition - Clinical stage from 10/11/2018: Stage IIB (cT2, cN1, cM0, G3, ER-, PR-, HER2+) - Signed by Jasmin Bartow, MD on 11/02/2018 - Clinical: No stage assigned - Unsigned    History of colon cancer, stage II  09/2005 Initial Diagnosis   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 Findings were a 5.9 x 3.8 x 1.2 cm moderate to well-differentiated adenocarcinoma penetrating the muscular  wall. Forty lymph nodes negative. No vascular or lymphatic invasion. Multiple diverticula with microabscess formation and inflammation. Carcinoma present in at least 1 diverticulum. Preop CEA 5.2 with lab normal range 0 to 2.5. Preop CT scan with no obvious additional pathology.    2007 -  Chemotherapy   She received oral Xeloda chemotherapy as an adjuvant. She declined treatment on a clinical trial.    11/12/2011 Initial Diagnosis   [name], stage II   12/09/2011 Procedure   Followup colonoscopy done on 12/09/2011. She was found to have 2 polyps which were removed. Chronic diverticulosis.     Malignant neoplasm of upper-outer quadrant of left breast in female, estrogen receptor negative ([name])  09/26/2018 Mammogram   Mammogram/US  of left breast 09/26/18 IMPRESSION:  1. 2.9cm irregular mass in the upper outer left breast corresponds to the palpable abnormality. This is highly suspicious for breast carcinoma.  2. Two adjacent abnormal left axillary  Lymph nodes suspicious for metastatic adenopathy. There is a third borderline abnormal left axillary LN with a cortex thickened to 4mm.  3. Benign right breast cyst. No evidence of right breast malignancy.    10/11/2018 Cancer Staging   Staging form: Breast, AJCC 8th Edition - Clinical stage from 10/11/2018: Stage IIB (cT2, cN1, cM0, G3, ER-,  PR-, HER2+) - Signed by Jasmin [place], MD on 11/02/2018   10/11/2018 Initial Biopsy   Diagnosis 10/11/18 1. Breast, left, needle core biopsy, upper outer left 2 o'clock - INVASIVE DUCTAL CARCINOMA. - DUCTAL CARCINOMA IN SITU. - LYMPHOVASCULAR INVASION IS IDENTIFIED. - SEE COMMENT. 2. Lymph node, needle/core biopsy, left axilla - INVASIVE DUCTAL CARCINOMA. - SEE COMMENT.   10/11/2018 Receptors her2   The tumor cells are POSITIVE for Her2 (3+). Estrogen Receptor: 0%, NEGATIVE Progesterone Receptor: 0%, NEGATIVE Proliferation Marker Ki67: 70%   11/02/2018 Initial Diagnosis   Malignant neoplasm of upper-outer  quadrant of left breast in female, estrogen receptor negative ([name])   11/15/2018 Breast MRI   MRI breast 11/15/18  IMPRESSION: 1. 3.3 centimeter mass in the LATERAL portion of the LEFT breast consistent with known malignancy. 2. There is significant non mass enhancement surrounding this mass and extending anteriorly into the nipple base, with largest diameter in the anterior to posterior axis, measuring 7.2 centimeters. 3. If the patient would consider breast conservation, additional MR guided core biopsies are recommended. Consider biopsy of the inferior and anterior extent of the non mass enhancement to document extent of disease. 4. Three enlarged LEFT axillary lymph nodes. 5. RIGHT breast is negative.   11/15/2018 PET scan   PET 11/15/18 IMPRESSION: Hypermetabolic left breast lesion compatible with known primary. Hypermetabolic left axillary lymph nodes are consistent with metastatic disease.   No evidence for additional hypermetabolic metastatic involvement in the neck, chest, abdomen, or pelvis.   11/17/2018 - 03/01/2019 Chemotherapy   Neo-adjuvant Kadcyla  and perjeta  q3weeks for 6 cycles starting 11/17/18. Stopped before surgery    11/29/2018 Pathology Results   Diagnosis 1. Breast, left, needle core biopsy, inferior anterior (cylinder clip) - INVASIVE DUCTAL CARCINOMA. - DUCTAL CARCINOMA IN SITU. - LYMPHOVASCULAR INVASION IS IDENTIFIED. - SEE COMMENT. 2. Breast, left, needle core biopsy, central posterior (barbell clip) - INVASIVE DUCTAL CARCINOMA. - LYMPHOVASCULAR INVASION IS IDENTIFIED.   02/12/2019 Breast MRI   IMPRESSION: 1. Complete resolution of previously identified enhancing mass and associated non mass enhancement within the left breast. This is consistent with excellent response to chemotherapy. No residual or suspicious findings are identified. 2. No MRI evidence of malignancy on the right. 3. Previously identified left axillary lymphadenopathy not definitively  seen on today's study. However, this may be due to decreased field-of-view compared to prior study.     03/14/2019 Cancer Staging   Staging form: Breast, AJCC 8th Edition - Pathologic stage from 03/14/2019: pT0, pN0, cM0, GX, ER: Unknown, PR: Unknown, HER2: Not Assessed - Signed by Jasmin Nubieber, MD on 03/28/2019   03/14/2019 Surgery   LEFT MASTECTOMY WITH TARGETED LYMPH NODE DISSECTION and Left Axillary Sentinel Lymph  Node Biopsy by Dr Alethea Andes 03/14/19    03/14/2019 Pathology Results   FINAL MICROSCOPIC DIAGNOSIS:   A. LYMPH NODE, LEFT, SENTINEL, BIOPSY:  - There is no evidence of carcinoma in 1 of 1 lymph node (0/1).   B. BREAST, LEFT, MASTECTOMY:  - Benign breast parenchyma with treatment-related changes.  - There is no evidence of malignancy.  - See oncology table below.   C. LYMPH NODE, LEFT #1, SENTINEL, BIOPSY:  - There is no evidence of carcinoma in 1 of 1 lymph node (0/1).   D. LYMPH NODE, LEFT #2, SENTINEL, BIOPSY:  - There is no evidence of carcinoma in 1 of 1 lymph node (0/1).     04/04/2019 - 04/25/2019 Chemotherapy   Maintenance Herceptin  injections every 3 weeks starting 04/03/19 to complete  1 year of treatment that was started in 11/2018. Stopped after 2nd dose as she will not be repeating Echos.    06/03/2021 - 06/24/2021 Chemotherapy   Patient is on Treatment Plan : BREAST Trastuzumab  q21d X 11 Cycles     06/25/2021 - 01/01/2022 Chemotherapy   Patient is on Treatment Plan : BREAST Docetaxel  + Trastuzumab  + Pertuzumab  (THP) q21d     06/25/2021 - 01/02/2022 Chemotherapy   Patient is on Treatment Plan : BREAST Docetaxel  + Trastuzumab  + Pertuzumab  (THP) q21d     09/14/2021 Imaging   CT chest/abdomen/pelvis  IMPRESSION:  1. Interval resolution of previously seen bulky left axillary and  subpectoral lymphadenopathy. No persistently enlarged lymph nodes.  2. Multiple liver lesions are significantly diminished in size.  3. Widespread osseous metastatic disease, with an interval  increase  in sclerosis of several previously lytic lesions.  4. Constellation of findings is consistent with treatment response  of metastatic disease  5. New, although age indeterminate pathologic wedge deformity of the  T6 vertebral body as well as an increased pathologic wedge deformity  of the L2 vertebral body.  6. Coronary artery disease.  Aortic Atherosclerosis (ICD10-I70.0).    01/26/2022 -  Chemotherapy   Patient is on Treatment Plan : BREAST METASTATIC Fam-Trastuzumab Deruxtecan-nxki  (Enhertu ) (5.4) q21d     Malignant neoplasm metastatic to liver ([name])  04/17/2021 Imaging   CT ABDOMEN AND PELVIS WITH CONTRAST: -New lytic lesion involving the L2 vertebral body (6:72, 2:26) with minimal cortical breakthrough superiorly, but with preservation of vertebral body height, concerning for metastatic disease.  -There are several indeterminate hepatic lesions as described above. In addition to the lesions described above, there is an additional subtle 1.5 cm hypodensity in the hepatic dome (2:7). Given clinical history and presence of a new L2 lesion, leading differential consideration is metastatic disease. Recommend further evaluation with MRI of the abdomen with and without contrast.   05/12/2021 Imaging   MRI ABDOMEN WITH AND WITHOUT CONTRAST: Numerous heterogeneously enhancing, internally necrotic lesions in the right hepatic lobe, highly concerning for metastatic disease. Largest lesion measures up to 6.7 cm in hepatic segment VI.   Enhancing osseous lesions in the L2 vertebral body, both iliac bones, and in the sacrum, highly concerning for osseous metastases.   05/15/2021 Initial Diagnosis   Liver metastases ([name])   05/27/2021 PET scan   1. Widespread recurrent/metastatic disease in this patient who is  status post bilateral mastectomy. Left chest wall recurrence with  left axillary/subpectoral, supraclavicular nodal metastasis.  2. Hepatic, left adrenal, and widespread osseous  metastasis.  3. Incidental findings, including: Right nephrolithiasis. Tiny  hiatal hernia.    05/27/2021 Imaging   MRI LUMBAR AND THORACIC SPINE: Thoracic spine:  Osseous metastatic disease involving each level. The most dramatic deposits are at T4 and T6 where there is extraosseous/epidural tumor. No cord compression but there is foraminal impingement on the left at T6-7 and on the right at T3-4. Early dorsal epidural tumor at the level of T10 and T3.   Lumbar spine:  1. Widespread osseous metastatic disease with largest deposit  replacing the L2 body where there is a compression fracture with  mild height loss. Also at this level is early epidural tumor  extension likely affecting both L2-3 foramina.  2. 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    05/27/2021 Imaging   MRI HEAD WITH AND WITHOUT CONTRAST: Negative for metastatic disease to the brain or calvarium.   06/03/2021 - 06/24/2021 Chemotherapy  Patient is on Treatment Plan : BREAST Trastuzumab  q21d X 11 Cycles     06/25/2021 - 01/01/2022 Chemotherapy   Patient is on Treatment Plan : BREAST Docetaxel  + Trastuzumab  + Pertuzumab  (THP) q21d     06/25/2021 - 01/02/2022 Chemotherapy   Patient is on Treatment Plan : BREAST Docetaxel  + Trastuzumab  + Pertuzumab  (THP) q21d     09/14/2021 Imaging   CT chest/abdomen/pelvis  IMPRESSION:  1. Interval resolution of previously seen bulky left axillary and  subpectoral lymphadenopathy. No persistently enlarged lymph nodes.  2. Multiple liver lesions are significantly diminished in size.  3. Widespread osseous metastatic disease, with an interval increase  in sclerosis of several previously lytic lesions.  4. Constellation of findings is consistent with treatment response  of metastatic disease  5. New, although age indeterminate pathologic wedge deformity of the  T6 vertebral body as well as an increased pathologic wedge deformity  of the L2 vertebral body.  6.  Coronary artery disease.  Aortic Atherosclerosis (ICD10-I70.0).    01/26/2022 -  Chemotherapy   Patient is on Treatment Plan : BREAST METASTATIC Fam-Trastuzumab Deruxtecan-nxki  (Enhertu ) (5.4) q21d     Malignant neoplasm metastatic to bone ([name])  04/17/2021 Imaging   CT ABDOMEN AND PELVIS WITH CONTRAST: -New lytic lesion involving the L2 vertebral body (6:72, 2:26) with minimal cortical breakthrough superiorly, but with preservation of vertebral body height, concerning for metastatic disease.  -There are several indeterminate hepatic lesions as described above. In addition to the lesions described above, there is an additional subtle 1.5 cm hypodensity in the hepatic dome (2:7). Given clinical history and presence of a new L2 lesion, leading differential consideration is metastatic disease. Recommend further evaluation with MRI of the abdomen with and without contrast.   05/12/2021 Imaging   MRI ABDOMEN WITH AND WITHOUT CONTRAST: Numerous heterogeneously enhancing, internally necrotic lesions in the right hepatic lobe, highly concerning for metastatic disease. Largest lesion measures up to 6.7 cm in hepatic segment VI.   Enhancing osseous lesions in the L2 vertebral body, both iliac bones, and in the sacrum, highly concerning for osseous metastases.   05/15/2021 Initial Diagnosis   Bone metastases ([name])   05/27/2021 PET scan   1. Widespread recurrent/metastatic disease in this patient who is  status post bilateral mastectomy. Left chest wall recurrence with  left axillary/subpectoral, supraclavicular nodal metastasis.  2. Hepatic, left adrenal, and widespread osseous metastasis.  3. Incidental findings, including: Right nephrolithiasis. Tiny  hiatal hernia.    05/27/2021 Imaging   MRI LUMBAR AND THORACIC SPINE: Thoracic spine:  Osseous metastatic disease involving each level. The most dramatic deposits are at T4 and T6 where there is extraosseous/epidural tumor. No cord compression but there  is foraminal impingement on the left at T6-7 and on the right at T3-4. Early dorsal epidural tumor at the level of T10 and T3.   Lumbar spine:  1. Widespread osseous metastatic disease with largest deposit  replacing the L2 body where there is a compression fracture with  mild height loss. Also at this level is early epidural tumor  extension likely affecting both L2-3 foramina.  2. 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    05/27/2021 Imaging   MRI HEAD WITH AND WITHOUT CONTRAST: Negative for metastatic disease to the brain or calvarium.   06/03/2021 - 06/24/2021 Chemotherapy   Patient is on Treatment Plan : BREAST Trastuzumab  q21d X 11 Cycles     06/25/2021 - 01/01/2022 Chemotherapy   Patient is on Treatment Plan :  BREAST Docetaxel  + Trastuzumab  + Pertuzumab  (THP) q21d     06/25/2021 - 01/02/2022 Chemotherapy   Patient is on Treatment Plan : BREAST Docetaxel  + Trastuzumab  + Pertuzumab  (THP) q21d     09/14/2021 Imaging   CT chest/abdomen/pelvis  IMPRESSION:  1. Interval resolution of previously seen bulky left axillary and  subpectoral lymphadenopathy. No persistently enlarged lymph nodes.  2. Multiple liver lesions are significantly diminished in size.  3. Widespread osseous metastatic disease, with an interval increase  in sclerosis of several previously lytic lesions.  4. Constellation of findings is consistent with treatment response  of metastatic disease  5. New, although age indeterminate pathologic wedge deformity of the  T6 vertebral body as well as an increased pathologic wedge deformity  of the L2 vertebral body.  6. Coronary artery disease.  Aortic Atherosclerosis (ICD10-I70.0).    01/26/2022 -  Chemotherapy   Patient is on Treatment Plan : BREAST METASTATIC Fam-Trastuzumab Deruxtecan-nxki  (Enhertu ) (5.4) q21d       INTERVAL HISTORY:  Jasmin Lewis is here today for repeat clinical assessment for metastatic HER2 receptor positive breast cancer.  Patient has no complaints of pain. She had a CT chest, abdomen, and pelvis done on 08/11/2023 that revealed multifocal sclerotic bone metastases are similar to previous, stable areas of compression deformity at L2 and T6, separate healing right lateral rib fracture, and the left adrenal nodule appeared slightly larger. There was no developing new lymph node enlargement, fatty liver infiltration with slightly nodular contour, vague low-attenuation liver lesion on previous was not well seen, and no new liver lesion at this time was also noted. I reviewed this report with her and her son once again. Her day 1 cycle 30 of Enhertu  is scheduled on 09/28/2023. She has a low WBC of 3.9, hemoglobin of 12.8, and platelet count of 161,000. Her CMP is normal. I will see her back in 3 weeks with CBC and CMP. She denies fever, chills, night sweats, or other signs of infection. She denies cardiorespiratory and gastrointestinal issues. She  denies pain. Her appetite is the same and Her weight has increased 1 pounds over last 4 weeks.   REVIEW OF SYSTEMS:  Review of Systems  Constitutional: Negative.  Negative for appetite change, chills, diaphoresis, fatigue, fever and unexpected weight change.  HENT:  Negative.  Negative for hearing loss, lump/mass, mouth sores, nosebleeds, sore throat, tinnitus, trouble swallowing and voice change.   Eyes:  Positive for eye problems (excessive tearing). Negative for icterus.  Respiratory: Negative.  Negative for chest tightness, cough, hemoptysis, shortness of breath and wheezing.   Cardiovascular: Negative.  Negative for chest pain, leg swelling and palpitations.  Gastrointestinal: Negative.  Negative for abdominal distention, abdominal pain, blood in stool, constipation, diarrhea, nausea, rectal pain and vomiting.  Endocrine: Negative.  Negative for hot flashes.  Genitourinary: Negative.  Negative for bladder incontinence, difficulty urinating, dyspareunia, dysuria, frequency,  hematuria, menstrual problem, nocturia, pelvic pain, vaginal bleeding and vaginal discharge.   Musculoskeletal:  Positive for arthralgias and gait problem (ambulates with a cane). Negative for back pain, flank pain, myalgias, neck pain and neck stiffness.  Skin: Negative.  Negative for itching, rash and wound.  Neurological:  Positive for extremity weakness (left hand), gait problem (ambulates with a cane) and numbness (left hand). Negative for dizziness, headaches, light-headedness, seizures and speech difficulty.  Hematological: Negative.  Negative for adenopathy. Does not bruise/bleed easily.  Psychiatric/Behavioral: Negative.  Negative for confusion, decreased concentration, depression, sleep disturbance and suicidal ideas. The patient is not  nervous/anxious.      VITALS:  Blood pressure (!) 188/78, pulse 82, temperature (!) 97.5 F (36.4 C), temperature source Oral, resp. rate 18, height 5' 0.25" (1.53 m), weight 110 lb (49.9 kg), SpO2 97%.  Wt Readings from Last 3 Encounters:  09/28/23 110 lb (49.9 kg)  09/07/23 109 lb 11.2 oz (49.8 kg)  08/17/23 110 lb 4.8 oz (50 kg)    Body mass index is 21.3 kg/m.  Performance status (ECOG): 1 - Symptomatic but completely ambulatory  PHYSICAL EXAM:  Physical Exam Vitals and nursing note reviewed.  Constitutional:      General: She is not in acute distress.    Appearance: Normal appearance. She is normal weight.  HENT:     Head: Normocephalic and atraumatic.     Right Ear: Tympanic membrane, ear canal and external ear normal. There is no impacted cerumen.     Left Ear: Tympanic membrane, ear canal and external ear normal. There is no impacted cerumen.     Nose: Nose normal. No congestion or rhinorrhea.     Mouth/Throat:     Mouth: Mucous membranes are moist.     Pharynx: Oropharynx is clear. No oropharyngeal exudate or posterior oropharyngeal erythema.  Eyes:     General: No scleral icterus.       Right eye: No discharge.        Left  eye: No discharge.     Extraocular Movements: Extraocular movements intact.     Conjunctiva/sclera: Conjunctivae normal.     Pupils: Pupils are equal, round, and reactive to light.  Neck:     Vascular: No carotid bruit.  Cardiovascular:     Rate and Rhythm: Normal rate and regular rhythm.     Pulses: Normal pulses.     Heart sounds: Normal heart sounds. No murmur heard.    No friction rub. No gallop.  Pulmonary:     Effort: Pulmonary effort is normal. No respiratory distress.     Breath sounds: Normal breath sounds. No stridor. No wheezing, rhonchi or rales.  Chest:     Chest wall: No tenderness.  Breasts:    Right: Normal. No swelling, bleeding, inverted nipple, mass, nipple discharge, skin change or tenderness.     Left: Absent.     Comments: Left mastectomy site is negative. Right breast is without masses  Abdominal:     General: Bowel sounds are normal. There is no distension.     Palpations: Abdomen is soft. There is no hepatomegaly, splenomegaly or mass.     Tenderness: There is no abdominal tenderness. There is no right CVA tenderness, left CVA tenderness, guarding or rebound.     Hernia: No hernia is present.  Musculoskeletal:        General: No swelling, tenderness or signs of injury. Normal range of motion.     Left hand: Deformity present.     Cervical back: Normal range of motion. No rigidity or tenderness.     Right lower leg: No edema.     Left lower leg: No edema.  Lymphadenopathy:     Cervical: No cervical adenopathy.     Right cervical: No superficial, deep or posterior cervical adenopathy.    Left cervical: No superficial, deep or posterior cervical adenopathy.     Upper Body:     Right upper body: No supraclavicular, axillary or pectoral adenopathy.     Left upper body: No supraclavicular, axillary or pectoral adenopathy.     Lower Body: No right inguinal adenopathy.  No left inguinal adenopathy.  Skin:    General: Skin is warm and dry.     Coloration: Skin  is not jaundiced or pale.     Findings: No bruising, erythema, lesion or rash.  Neurological:     General: No focal deficit present.     Mental Status: She is alert and oriented to person, place, and time. Mental status is at baseline.     Cranial Nerves: No cranial nerve deficit.     Sensory: No sensory deficit.     Motor: No weakness.     Coordination: Coordination normal.     Gait: Gait normal.     Deep Tendon Reflexes: Reflexes normal.  Psychiatric:        Mood and Affect: Mood normal.        Behavior: Behavior normal.        Thought Content: Thought content normal.        Judgment: Judgment normal.     LABS:      Latest Ref Rng & Units 09/28/2023   12:54 PM 09/07/2023   12:48 PM 08/17/2023   12:39 PM  CBC  WBC 4.0 - 10.5 K/uL 3.9  4.3  5.3   Hemoglobin 12.0 - 15.0 g/dL 09.8  11.9  14.7   Hematocrit 36.0 - 46.0 % 37.7  38.0  39.9   Platelets 150 - 400 K/uL 161  165  179       Latest Ref Rng & Units 09/28/2023   12:54 PM 09/07/2023   12:48 PM 08/17/2023   12:39 PM  CMP  Glucose 70 - 99 mg/dL 85  97  89   BUN 8 - 23 mg/dL 19  15  21    Creatinine 0.44 - 1.00 mg/dL 8.29  5.62  1.30   Sodium 135 - 145 mmol/L 142  140  142   Potassium 3.5 - 5.1 mmol/L 4.4  4.4  4.5   Chloride 98 - 111 mmol/L 107  105  106   CO2 22 - 32 mmol/L 23  27  26    Calcium 8.9 - 10.3 mg/dL 86.5  78.4  69.6   Total Protein 6.5 - 8.1 g/dL 6.6  6.6  6.6   Total Bilirubin 0.0 - 1.2 mg/dL 0.5  0.4  0.5   Alkaline Phos 38 - 126 U/L 62  64  67   AST 15 - 41 U/L 39  43  45   ALT 0 - 44 U/L 17  17  21       Lab Results  Component Value Date   CEA1 1.8 06/03/2021   CEA 0.5 11/13/2012   /  CEA  Date Value Ref Range Status  06/03/2021 1.8 0.0 - 4.7 ng/mL Final    Comment:    (NOTE)                             Nonsmokers          <3.9                             Smokers             <5.6 [employer] Electrochemiluminescence Immunoassay (ECLIA) Values obtained with different assay methods or  kits cannot be used interchangeably.  Results cannot be interpreted as absolute evidence of the presence or absence of malignant disease. Performed At: [facility] [employer] [address]  [place], [place] [id number] Pearlean Botts MD [id number]   11/13/2012 0.5 0.0 - 5.0 ng/mL Final    Lab Results  Component Value Date   LDH 186 11/12/2011   LDH 187 09/08/2010   LDH 171 09/09/2009    STUDIES:  EXAM: 08/11/2023 CT CHEST, ABDOMEN, AND PELVIS WITH CONTRAST Impression: Multifocal sclerotic bone metastases are similar to previous. Stable areas of compression deformity at L2 and T6.  Separate healing right lateral rib fracture. Left adrenal nodule appears slightly larger today. Attention on follow-up. No developing new lymph node enlargement, soft tissue mass. Fatty liver infiltration with slightly nodular contour. Vague low-attenuation liver lesion on previous is not well seen today. No new liver lesion at this time.   HISTORY:   Past Medical History:  Diagnosis Date   Arthritis    shoulder   Breast cancer ([name])    Cancer ([name])    left breast ca   Colon cancer ([name])    [name], stage II 11/12/2011   Sigmoid lesion 5.9 cm  40 nodes negative but focus of cancer in a diverticum   Pre-op CEA 5.2 with lab normal up to 2.5 resected 09/21/05   Xeloda adjuvant chemotherapy   Hypertension    Polyneuropathy 02/03/2023   Right shoulder pain 04/12/2023    Past Surgical History:  Procedure Laterality Date   COLONOSCOPY     HEMICOLECTOMY  2007   MASTECTOMY WITH AXILLARY LYMPH NODE DISSECTION Left 03/14/2019   Procedure: LEFT MASTECTOMY WITH TARGETED LYMPH NODE DISSECTION;  Surgeon: Caralyn Chandler, MD;  Location: [facility] OR;  Service: General;  Laterality: Left;   SENTINEL NODE BIOPSY Left 03/14/2019   Procedure: Left Axillary Sentinel Lymph  Node Biopsy;  Surgeon: Caralyn Chandler, MD;  Location: [facility] OR;  Service: General;  Laterality: Left;   TONSILLECTOMY     WISDOM TOOTH  EXTRACTION      Family History  Problem Relation Age of Onset   Cancer Maternal Aunt        colon cancer     Social History:  reports that she has never smoked. She has never used smokeless tobacco. She reports that she does not drink alcohol and does not use drugs.The patient is alone today.  Allergies: No Known Allergies  Current Medications: Current Outpatient Medications  Medication Sig Dispense Refill   Biotin 1 MG CAPS Take by mouth.     bisacodyl (DULCOLAX) 5 MG EC tablet Take 5 mg by mouth daily as needed for moderate constipation. 2 capsules every night     co-enzyme Q-10 30 MG capsule Take 100 mg by mouth daily.     fish oil-omega-3 fatty acids 1000 MG capsule Take 1 g by mouth daily.     gabapentin  (NEURONTIN ) 100 MG capsule Take 1 capsule (100 mg total) by mouth at bedtime. (Patient not taking: Reported on 05/02/2023) 30 capsule 5   MAGNESIUM PO Take 1 tablet by mouth every evening. 600 mg     Multiple Vitamin (MULTIVITAMIN) capsule Take 1 capsule by mouth daily.     ondansetron  (ZOFRAN ) 4 MG tablet Take 1 tablet (4 mg total) by mouth every 8 (eight) hours as needed for nausea or vomiting. (Patient not taking: Reported on 05/02/2023) 20 tablet 2   polyethylene glycol (MIRALAX / GLYCOLAX) 17 g packet Take 17 g by mouth as needed. constipation     No current facility-administered medications for this visit.    I,Jasmine M Lassiter,acting as a scribe for Jasmin Baumgartner, MD.,have documented  all relevant documentation on the behalf of Jasmin Baumgartner, MD,as directed by  Jasmin Baumgartner, MD while in the presence of Jasmin Baumgartner, MD.

## 2023-09-28 ENCOUNTER — Encounter: Payer: Self-pay | Admitting: Oncology

## 2023-09-28 ENCOUNTER — Inpatient Hospital Stay

## 2023-09-28 ENCOUNTER — Inpatient Hospital Stay: Attending: Hematology and Oncology | Admitting: Oncology

## 2023-09-28 ENCOUNTER — Other Ambulatory Visit: Payer: Self-pay | Admitting: Pharmacist

## 2023-09-28 ENCOUNTER — Telehealth: Payer: Self-pay | Admitting: Oncology

## 2023-09-28 VITALS — BP 188/78 | HR 82 | Temp 97.5°F | Resp 18 | Ht 60.25 in | Wt 110.0 lb

## 2023-09-28 VITALS — BP 170/71

## 2023-09-28 DIAGNOSIS — Z171 Estrogen receptor negative status [ER-]: Secondary | ICD-10-CM

## 2023-09-28 DIAGNOSIS — K760 Fatty (change of) liver, not elsewhere classified: Secondary | ICD-10-CM | POA: Insufficient documentation

## 2023-09-28 DIAGNOSIS — Z800 Family history of malignant neoplasm of digestive organs: Secondary | ICD-10-CM | POA: Diagnosis not present

## 2023-09-28 DIAGNOSIS — C7951 Secondary malignant neoplasm of bone: Secondary | ICD-10-CM

## 2023-09-28 DIAGNOSIS — Z5112 Encounter for antineoplastic immunotherapy: Secondary | ICD-10-CM | POA: Diagnosis present

## 2023-09-28 DIAGNOSIS — C50412 Malignant neoplasm of upper-outer quadrant of left female breast: Secondary | ICD-10-CM

## 2023-09-28 DIAGNOSIS — I972 Postmastectomy lymphedema syndrome: Secondary | ICD-10-CM | POA: Insufficient documentation

## 2023-09-28 DIAGNOSIS — Z79899 Other long term (current) drug therapy: Secondary | ICD-10-CM | POA: Insufficient documentation

## 2023-09-28 DIAGNOSIS — S2231XA Fracture of one rib, right side, initial encounter for closed fracture: Secondary | ICD-10-CM | POA: Diagnosis not present

## 2023-09-28 DIAGNOSIS — C787 Secondary malignant neoplasm of liver and intrahepatic bile duct: Secondary | ICD-10-CM

## 2023-09-28 DIAGNOSIS — Z85038 Personal history of other malignant neoplasm of large intestine: Secondary | ICD-10-CM | POA: Insufficient documentation

## 2023-09-28 DIAGNOSIS — G629 Polyneuropathy, unspecified: Secondary | ICD-10-CM | POA: Diagnosis not present

## 2023-09-28 DIAGNOSIS — E278 Other specified disorders of adrenal gland: Secondary | ICD-10-CM | POA: Insufficient documentation

## 2023-09-28 DIAGNOSIS — M6250 Muscle wasting and atrophy, not elsewhere classified, unspecified site: Secondary | ICD-10-CM | POA: Diagnosis not present

## 2023-09-28 DIAGNOSIS — Z9012 Acquired absence of left breast and nipple: Secondary | ICD-10-CM | POA: Insufficient documentation

## 2023-09-28 LAB — CBC WITH DIFFERENTIAL (CANCER CENTER ONLY)
Abs Immature Granulocytes: 0.01 10*3/uL (ref 0.00–0.07)
Basophils Absolute: 0 10*3/uL (ref 0.0–0.1)
Basophils Relative: 1 %
Eosinophils Absolute: 0.1 10*3/uL (ref 0.0–0.5)
Eosinophils Relative: 2 %
HCT: 37.7 % (ref 36.0–46.0)
Hemoglobin: 12.8 g/dL (ref 12.0–15.0)
Immature Granulocytes: 0 %
Immature Platelet Fraction: 1.6 % (ref 1.2–8.6)
Lymphocytes Relative: 38 %
Lymphs Abs: 1.5 10*3/uL (ref 0.7–4.0)
MCH: 35.2 pg — ABNORMAL HIGH (ref 26.0–34.0)
MCHC: 34 g/dL (ref 30.0–36.0)
MCV: 103.6 fL — ABNORMAL HIGH (ref 80.0–100.0)
Monocytes Absolute: 0.5 10*3/uL (ref 0.1–1.0)
Monocytes Relative: 13 %
Neutro Abs: 1.8 10*3/uL (ref 1.7–7.7)
Neutrophils Relative %: 46 %
Platelet Count: 161 10*3/uL (ref 150–400)
RBC: 3.64 MIL/uL — ABNORMAL LOW (ref 3.87–5.11)
RDW: 13.9 % (ref 11.5–15.5)
WBC Count: 3.9 10*3/uL — ABNORMAL LOW (ref 4.0–10.5)
nRBC: 0 % (ref 0.0–0.2)

## 2023-09-28 LAB — CMP (CANCER CENTER ONLY)
ALT: 17 U/L (ref 0–44)
AST: 39 U/L (ref 15–41)
Albumin: 3.9 g/dL (ref 3.5–5.0)
Alkaline Phosphatase: 62 U/L (ref 38–126)
Anion gap: 11 (ref 5–15)
BUN: 19 mg/dL (ref 8–23)
CO2: 23 mmol/L (ref 22–32)
Calcium: 10.3 mg/dL (ref 8.9–10.3)
Chloride: 107 mmol/L (ref 98–111)
Creatinine: 0.73 mg/dL (ref 0.44–1.00)
GFR, Estimated: >60 mL/min (ref >60)
Glucose, Bld: 85 mg/dL (ref 70–99)
Potassium: 4.4 mmol/L (ref 3.5–5.1)
Sodium: 142 mmol/L (ref 135–145)
Total Bilirubin: 0.5 mg/dL (ref 0.0–1.2)
Total Protein: 6.6 g/dL (ref 6.5–8.1)

## 2023-09-28 MED ORDER — PALONOSETRON HCL INJECTION 0.25 MG/5ML
0.2500 mg | Freq: Once | INTRAVENOUS | Status: AC
  Administered 2023-09-28: 0.25 mg via INTRAVENOUS
  Filled 2023-09-28: qty 5

## 2023-09-28 MED ORDER — ZOLEDRONIC ACID 4 MG/5ML IV CONC
3.0000 mg | Freq: Once | INTRAVENOUS | Status: AC
  Administered 2023-09-28: 3 mg via INTRAVENOUS
  Filled 2023-09-28: qty 3.75

## 2023-09-28 MED ORDER — DEXAMETHASONE SODIUM PHOSPHATE 10 MG/ML IJ SOLN
10.0000 mg | Freq: Once | INTRAMUSCULAR | Status: AC
  Administered 2023-09-28: 10 mg via INTRAVENOUS
  Filled 2023-09-28: qty 1

## 2023-09-28 MED ORDER — DIPHENHYDRAMINE HCL 25 MG PO CAPS
25.0000 mg | ORAL_CAPSULE | Freq: Once | ORAL | Status: AC
  Administered 2023-09-28: 25 mg via ORAL
  Filled 2023-09-28: qty 1

## 2023-09-28 MED ORDER — SODIUM CHLORIDE 0.9% FLUSH
10.0000 mL | INTRAVENOUS | Status: DC | PRN
  Administered 2023-09-28: 10 mL

## 2023-09-28 MED ORDER — ACETAMINOPHEN 325 MG PO TABS
650.0000 mg | ORAL_TABLET | Freq: Once | ORAL | Status: AC
  Administered 2023-09-28: 650 mg via ORAL
  Filled 2023-09-28: qty 2

## 2023-09-28 MED ORDER — HEPARIN SOD (PORK) LOCK FLUSH 100 UNIT/ML IV SOLN
500.0000 [IU] | Freq: Once | INTRAVENOUS | Status: AC | PRN
  Administered 2023-09-28: 500 [IU]

## 2023-09-28 MED ORDER — FAM-TRASTUZUMAB DERUXTECAN-NXKI CHEMO 100 MG IV SOLR
4.0500 mg/kg | Freq: Once | INTRAVENOUS | Status: AC
  Administered 2023-09-28: 200 mg via INTRAVENOUS
  Filled 2023-09-28: qty 10

## 2023-09-28 MED ORDER — DEXTROSE 5 % IV SOLN
Freq: Once | INTRAVENOUS | Status: AC
Start: 2023-09-28 — End: 2023-09-28

## 2023-09-28 NOTE — Patient Instructions (Signed)
 [facility] [place] - A DEPT OF [facility]  Discharge Instructions: Thank you for choosing [facility] to provide your oncology and hematology care.  If you have a lab appointment with the Cancer Center, please go directly to the Cancer Center and check in at the registration area.   Wear comfortable clothing and clothing appropriate for easy access to any Portacath or PICC line.   We strive to give you quality time with your provider. You may need to reschedule your appointment if you arrive late (15 or more minutes).  Arriving late affects you and other patients whose appointments are after yours.  Also, if you miss three or more appointments without notifying the office, you may be dismissed from the clinic at the provider's discretion.      For prescription refill requests, have your pharmacy contact our office and allow 72 hours for refills to be completed.    Today you received the following chemotherapy and/or immunotherapy agents Enhertu      To help prevent nausea and vomiting after your treatment, we encourage you to take your nausea medication as directed.  BELOW ARE SYMPTOMS THAT SHOULD BE REPORTED IMMEDIATELY: *FEVER GREATER THAN 100.4 F (38 C) OR HIGHER *CHILLS OR SWEATING *NAUSEA AND VOMITING THAT IS NOT CONTROLLED WITH YOUR NAUSEA MEDICATION *UNUSUAL SHORTNESS OF BREATH *UNUSUAL BRUISING OR BLEEDING *URINARY PROBLEMS (pain or burning when urinating, or frequent urination) *BOWEL PROBLEMS (unusual diarrhea, constipation, pain near the anus) TENDERNESS IN MOUTH AND THROAT WITH OR WITHOUT PRESENCE OF ULCERS (sore throat, sores in mouth, or a toothache) UNUSUAL RASH, SWELLING OR PAIN  UNUSUAL VAGINAL DISCHARGE OR ITCHING   Items with * indicate a potential emergency and should be followed up as soon as possible or go to the Emergency Department if any problems should occur.  Please show the CHEMOTHERAPY ALERT CARD or IMMUNOTHERAPY ALERT  CARD at check-in to the Emergency Department and triage nurse.  Should you have questions after your visit or need to cancel or reschedule your appointment, please contact [facility] [place] - A DEPT OF [facility]  Dept: [phone]  and follow the prompts.  Office hours are 8:00 a.m. to 4:30 p.m. Monday - Friday. Please note that voicemails left after 4:00 p.m. may not be returned until the following business day.  We are closed weekends and major holidays. You have access to a nurse at all times for urgent questions. Please call the main number to the clinic Dept: [phone] and follow the prompts.  For any non-urgent questions, you may also contact your provider using MyChart. We now offer e-Visits for anyone 23 and older to request care online for non-urgent symptoms. For details visit [email or web address].   Also download the MyChart app! Go to the app store, search "MyChart", open the app, select [facility], and log in with your MyChart username and password.

## 2023-09-28 NOTE — Telephone Encounter (Signed)
 Patient has been scheduled for follow-up visit per 09/28/23 LOS.  Pt given an appt calendar with date and time.

## 2023-09-29 ENCOUNTER — Other Ambulatory Visit: Payer: Self-pay

## 2023-10-06 ENCOUNTER — Encounter: Payer: Self-pay | Admitting: Oncology

## 2023-10-06 ENCOUNTER — Other Ambulatory Visit: Payer: Self-pay

## 2023-10-09 ENCOUNTER — Other Ambulatory Visit: Payer: Self-pay

## 2023-10-12 NOTE — Progress Notes (Incomplete)
 [facility]  [address] [place],  [place]  [address] [phone]  Clinic Day:  10/12/23   Referring physician: Alfrieda Antes, MD  ASSESSMENT & PLAN:  Assessment: Malignant neoplasm metastatic to liver [facility]) Numerous heterogeneously enhancing, internally necrotic lesions in the right hepatic lobe CT in December 2023, which was consistent with metastatic disease. Largest lesion measures up to 6.7 cm and there are at least 6 lesions.  Liver biopsy was consistent with metastatic breast cancer with negative estrogen and progesterone receptors and positive HER2 receptor. She was initially treated with trastuzumab  with an excellent response with significant decrease in the size of her liver lesions and sustained response. She later had progression of this disease in the chest wall and was switched to Enhertu .  CT chest, abdomen, and pelvis in March revealed stable subtle liver lesions with at least 1 lesion is no longer visualized.    CT chest, abdomen and pelvis in October 2024 revealed a decrease in the hepatic lesions with no new lesions seen.  CT imaging in April was fairly stable except a slight increase in the left adrenal nodule. There was a stable vague liver lesion and stable bone metatasis. She continues Enhertu  every 3 weeks without significant difficulty.  Although, she continues to complain bitterly of excessive tearing.  Her PCP recommended she try Pataday, an antihistamine eye drop, and I recommended she give it a try. She will proceed with a 30th cycle of Enhertu  today.    Malignant neoplasm metastatic to bone [facility]) Bone metastases diagnosed in December 2022, for which she was placed on zoledronic  acid every 4 weeks. She is now receiving zoledronic  acid every 6 weeks to correspond with her chemotherapy.  She has borderline hypercalcemia.  CT imaging in April 2025 did not reveal any evidence of progressive disease.    Chest wall recurrence of breast cancer, left ([name]) Left  chest wall recurrence diagnosed in January 2023.  Biopsy revealed carcinoma consistent with her previous breast cancer with negative estrogen and progesterone receptors and positive HER2 receptor.  She was initially treated with docetaxel /trastuzumab  and received 10 cycles with a good response.  She then had recurrence in the left chest wall in September 2023, so was switched to Enhertu  good response.  There is no recurrence in the left chest wall.   Malignant neoplasm of upper-outer quadrant of left breast in female, estrogen receptor negative ([name]) History of stage IIB (T2c N1 M0) HER2 receptor positive, ER/PR negative, left breast cancer diagnosed in June 2020.  She was treated with neoadjuvant Kadcyla /Perjeta  followed by left mastectomy.  She had a complete response in breast and nodes. She had only received two doses of adjuvant trastuzumab  before discontinuing in December 2020. She developed recurrent disease in 2022   Polyneuropathy She has had chronic left hand pain, initially thought to be related to lymphedema.  Her lymphedema was controlled. She has decreased strength and muscle wasting.  She was referred to neurology for further evaluation.  EMG revealed polyneuropathy.  She has now seen orthopedics and had an MRI of the left brachial plexus that was unremarkable, but limited due to patient motion.  She had been on gabapentin  100 mg at bedtime, but discontinued that on her own. She saw Dr. Eugene Lewis in neurology in October 2024 and underwent repeat EMG, which was more abnormal. She has been seen by Dr. Aloha Lewis with [name] in [place], who did not feel surgery was a good option. The patient no longer sees occupational therapy,  but does exercises at home.   Plan: CT chest, abdomen, and pelvis done on 08/11/2023 revealed multifocal sclerotic bone metastases are similar to previous, stable areas of compression deformity at L2 and T6, separate healing right lateral rib fracture, and the left adrenal  nodule appeared slightly larger. There was no developing new lymph node enlargement, fatty liver infiltration with slightly nodular contour, vague low-attenuation liver lesion on previous was not well seen, and no new liver lesion at this time was also noted. I reviewed this report with her and her son once again. Her day 1 cycle 30 of Enhertu  is scheduled on 09/28/2023. She has a low WBC of 3.9, hemoglobin of 12.8, and platelet count of 161,000. Her CMP is normal. I will see her back in 3 weeks with CBC and CMP. The patient understands the plans discussed today and is in agreement with them.  She knows to contact our office if she develops concerns prior to her next appointment.  I provided 18 minutes of face-to-face time during this encounter and > 50% was spent counseling as documented under my assessment and plan.   Jasmin Baumgartner, MD  [facility] - A DEPT OF [facility] [address] [place] [address] Dept: [phone] Dept Fax: [phone]   [name].    CHIEF COMPLAINT:  CC: Metastatic HER2 receptor positive breast cancer  Current Treatment: Enhertu  every 3 weeks/Zometa  every 6 weeks  HISTORY OF PRESENT ILLNESS:   Oncology History Overview Note  Cancer Staging Malignant neoplasm of upper-outer quadrant of left breast in female, estrogen receptor negative ([name]) Staging form: Breast, AJCC 8th Edition - Clinical stage from 10/11/2018: Stage IIB (cT2, cN1, cM0, G3, ER-, PR-, HER2+) - Signed by Jasmin Shelton, MD on 11/02/2018 - Clinical: No stage assigned - Unsigned    History of colon cancer, stage II  09/2005 Initial Diagnosis   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 Findings were a 5.9 x 3.8 x 1.2 cm moderate to well-differentiated adenocarcinoma penetrating the muscular  wall. Forty lymph nodes negative. No vascular or lymphatic invasion. Multiple diverticula with microabscess formation and inflammation. Carcinoma present in at least 1 diverticulum. Preop CEA 5.2 with lab normal range 0 to 2.5. Preop CT scan with no obvious additional pathology.    2007 -  Chemotherapy   She received oral Xeloda chemotherapy as an adjuvant. She declined treatment on a clinical trial.    11/12/2011 Initial Diagnosis   [name], stage II   12/09/2011 Procedure   Followup colonoscopy done on 12/09/2011. She was found to have 2 polyps which were removed. Chronic diverticulosis.     Malignant neoplasm of upper-outer quadrant of left breast in female, estrogen receptor negative ([name])  09/26/2018 Mammogram   Mammogram/US  of left breast 09/26/18 IMPRESSION:  1. 2.9cm irregular mass in the upper outer left breast corresponds to the palpable abnormality. This is highly suspicious for breast carcinoma.  2. Two adjacent abnormal left axillary  Lymph nodes suspicious for metastatic adenopathy. There is a third borderline abnormal left axillary LN with a cortex thickened to 4mm.  3. Benign right breast cyst. No evidence of right breast malignancy.    10/11/2018 Cancer Staging   Staging form: Breast, AJCC 8th Edition - Clinical stage from 10/11/2018: Stage IIB (cT2, cN1, cM0, G3,  ER-, PR-, HER2+) - Signed by Jasmin Wahpeton, MD on 11/02/2018   10/11/2018 Initial Biopsy   Diagnosis 10/11/18 1. Breast, left, needle core biopsy, upper outer left 2 o'clock - INVASIVE DUCTAL CARCINOMA. - DUCTAL CARCINOMA IN SITU. - LYMPHOVASCULAR INVASION IS IDENTIFIED. - SEE COMMENT. 2. Lymph node, needle/core biopsy, left axilla - INVASIVE DUCTAL CARCINOMA. - SEE COMMENT.   10/11/2018 Receptors her2   The tumor cells are POSITIVE for Her2 (3+). Estrogen Receptor: 0%, NEGATIVE Progesterone Receptor: 0%, NEGATIVE Proliferation Marker Ki67: 70%   11/02/2018 Initial Diagnosis   Malignant neoplasm of upper-outer  quadrant of left breast in female, estrogen receptor negative ([name])   11/15/2018 Breast MRI   MRI breast 11/15/18  IMPRESSION: 1. 3.3 centimeter mass in the LATERAL portion of the LEFT breast consistent with known malignancy. 2. There is significant non mass enhancement surrounding this mass and extending anteriorly into the nipple base, with largest diameter in the anterior to posterior axis, measuring 7.2 centimeters. 3. If the patient would consider breast conservation, additional MR guided core biopsies are recommended. Consider biopsy of the inferior and anterior extent of the non mass enhancement to document extent of disease. 4. Three enlarged LEFT axillary lymph nodes. 5. RIGHT breast is negative.   11/15/2018 PET scan   PET 11/15/18 IMPRESSION: Hypermetabolic left breast lesion compatible with known primary. Hypermetabolic left axillary lymph nodes are consistent with metastatic disease.   No evidence for additional hypermetabolic metastatic involvement in the neck, chest, abdomen, or pelvis.   11/17/2018 - 03/01/2019 Chemotherapy   Neo-adjuvant Kadcyla  and perjeta  q3weeks for 6 cycles starting 11/17/18. Stopped before surgery    11/29/2018 Pathology Results   Diagnosis 1. Breast, left, needle core biopsy, inferior anterior (cylinder clip) - INVASIVE DUCTAL CARCINOMA. - DUCTAL CARCINOMA IN SITU. - LYMPHOVASCULAR INVASION IS IDENTIFIED. - SEE COMMENT. 2. Breast, left, needle core biopsy, central posterior (barbell clip) - INVASIVE DUCTAL CARCINOMA. - LYMPHOVASCULAR INVASION IS IDENTIFIED.   02/12/2019 Breast MRI   IMPRESSION: 1. Complete resolution of previously identified enhancing mass and associated non mass enhancement within the left breast. This is consistent with excellent response to chemotherapy. No residual or suspicious findings are identified. 2. No MRI evidence of malignancy on the right. 3. Previously identified left axillary lymphadenopathy not definitively  seen on today's study. However, this may be due to decreased field-of-view compared to prior study.     03/14/2019 Cancer Staging   Staging form: Breast, AJCC 8th Edition - Pathologic stage from 03/14/2019: pT0, pN0, cM0, GX, ER: Unknown, PR: Unknown, HER2: Not Assessed - Signed by Jasmin , MD on 03/28/2019   03/14/2019 Surgery   LEFT MASTECTOMY WITH TARGETED LYMPH NODE DISSECTION and Left Axillary Sentinel Lymph  Node Biopsy by Dr Alethea Andes 03/14/19    03/14/2019 Pathology Results   FINAL MICROSCOPIC DIAGNOSIS:   A. LYMPH NODE, LEFT, SENTINEL, BIOPSY:  - There is no evidence of carcinoma in 1 of 1 lymph node (0/1).   B. BREAST, LEFT, MASTECTOMY:  - Benign breast parenchyma with treatment-related changes.  - There is no evidence of malignancy.  - See oncology table below.   C. LYMPH NODE, LEFT #1, SENTINEL, BIOPSY:  - There is no evidence of carcinoma in 1 of 1 lymph node (0/1).   D. LYMPH NODE, LEFT #2, SENTINEL, BIOPSY:  - There is no evidence of carcinoma in 1 of 1 lymph node (0/1).     04/04/2019 - 04/25/2019 Chemotherapy   Maintenance Herceptin  injections every 3 weeks starting 04/03/19 to  complete 1 year of treatment that was started in 11/2018. Stopped after 2nd dose as she will not be repeating Echos.    06/03/2021 - 06/24/2021 Chemotherapy   Patient is on Treatment Plan : BREAST Trastuzumab  q21d X 11 Cycles     06/25/2021 - 01/01/2022 Chemotherapy   Patient is on Treatment Plan : BREAST Docetaxel  + Trastuzumab  + Pertuzumab  (THP) q21d     06/25/2021 - 01/02/2022 Chemotherapy   Patient is on Treatment Plan : BREAST Docetaxel  + Trastuzumab  + Pertuzumab  (THP) q21d     09/14/2021 Imaging   CT chest/abdomen/pelvis  IMPRESSION:  1. Interval resolution of previously seen bulky left axillary and  subpectoral lymphadenopathy. No persistently enlarged lymph nodes.  2. Multiple liver lesions are significantly diminished in size.  3. Widespread osseous metastatic disease, with an interval  increase  in sclerosis of several previously lytic lesions.  4. Constellation of findings is consistent with treatment response  of metastatic disease  5. New, although age indeterminate pathologic wedge deformity of the  T6 vertebral body as well as an increased pathologic wedge deformity  of the L2 vertebral body.  6. Coronary artery disease.  Aortic Atherosclerosis (ICD10-I70.0).    01/26/2022 -  Chemotherapy   Patient is on Treatment Plan : BREAST METASTATIC Fam-Trastuzumab Deruxtecan-nxki  (Enhertu ) (5.4) q21d     Malignant neoplasm metastatic to liver ([name])  04/17/2021 Imaging   CT ABDOMEN AND PELVIS WITH CONTRAST: -New lytic lesion involving the L2 vertebral body (6:72, 2:26) with minimal cortical breakthrough superiorly, but with preservation of vertebral body height, concerning for metastatic disease.  -There are several indeterminate hepatic lesions as described above. In addition to the lesions described above, there is an additional subtle 1.5 cm hypodensity in the hepatic dome (2:7). Given clinical history and presence of a new L2 lesion, leading differential consideration is metastatic disease. Recommend further evaluation with MRI of the abdomen with and without contrast.   05/12/2021 Imaging   MRI ABDOMEN WITH AND WITHOUT CONTRAST: Numerous heterogeneously enhancing, internally necrotic lesions in the right hepatic lobe, highly concerning for metastatic disease. Largest lesion measures up to 6.7 cm in hepatic segment VI.   Enhancing osseous lesions in the L2 vertebral body, both iliac bones, and in the sacrum, highly concerning for osseous metastases.   05/15/2021 Initial Diagnosis   Liver metastases ([name])   05/27/2021 PET scan   1. Widespread recurrent/metastatic disease in this patient who is  status post bilateral mastectomy. Left chest wall recurrence with  left axillary/subpectoral, supraclavicular nodal metastasis.  2. Hepatic, left adrenal, and widespread osseous  metastasis.  3. Incidental findings, including: Right nephrolithiasis. Tiny  hiatal hernia.    05/27/2021 Imaging   MRI LUMBAR AND THORACIC SPINE: Thoracic spine:  Osseous metastatic disease involving each level. The most dramatic deposits are at T4 and T6 where there is extraosseous/epidural tumor. No cord compression but there is foraminal impingement on the left at T6-7 and on the right at T3-4. Early dorsal epidural tumor at the level of T10 and T3.   Lumbar spine:  1. Widespread osseous metastatic disease with largest deposit  replacing the L2 body where there is a compression fracture with  mild height loss. Also at this level is early epidural tumor  extension likely affecting both L2-3 foramina.  2. 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    05/27/2021 Imaging   MRI HEAD WITH AND WITHOUT CONTRAST: Negative for metastatic disease to the brain or calvarium.   06/03/2021 - 06/24/2021  Chemotherapy   Patient is on Treatment Plan : BREAST Trastuzumab  q21d X 11 Cycles     06/25/2021 - 01/01/2022 Chemotherapy   Patient is on Treatment Plan : BREAST Docetaxel  + Trastuzumab  + Pertuzumab  (THP) q21d     06/25/2021 - 01/02/2022 Chemotherapy   Patient is on Treatment Plan : BREAST Docetaxel  + Trastuzumab  + Pertuzumab  (THP) q21d     09/14/2021 Imaging   CT chest/abdomen/pelvis  IMPRESSION:  1. Interval resolution of previously seen bulky left axillary and  subpectoral lymphadenopathy. No persistently enlarged lymph nodes.  2. Multiple liver lesions are significantly diminished in size.  3. Widespread osseous metastatic disease, with an interval increase  in sclerosis of several previously lytic lesions.  4. Constellation of findings is consistent with treatment response  of metastatic disease  5. New, although age indeterminate pathologic wedge deformity of the  T6 vertebral body as well as an increased pathologic wedge deformity  of the L2 vertebral body.  6.  Coronary artery disease.  Aortic Atherosclerosis (ICD10-I70.0).    01/26/2022 -  Chemotherapy   Patient is on Treatment Plan : BREAST METASTATIC Fam-Trastuzumab Deruxtecan-nxki  (Enhertu ) (5.4) q21d     Malignant neoplasm metastatic to bone ([name])  04/17/2021 Imaging   CT ABDOMEN AND PELVIS WITH CONTRAST: -New lytic lesion involving the L2 vertebral body (6:72, 2:26) with minimal cortical breakthrough superiorly, but with preservation of vertebral body height, concerning for metastatic disease.  -There are several indeterminate hepatic lesions as described above. In addition to the lesions described above, there is an additional subtle 1.5 cm hypodensity in the hepatic dome (2:7). Given clinical history and presence of a new L2 lesion, leading differential consideration is metastatic disease. Recommend further evaluation with MRI of the abdomen with and without contrast.   05/12/2021 Imaging   MRI ABDOMEN WITH AND WITHOUT CONTRAST: Numerous heterogeneously enhancing, internally necrotic lesions in the right hepatic lobe, highly concerning for metastatic disease. Largest lesion measures up to 6.7 cm in hepatic segment VI.   Enhancing osseous lesions in the L2 vertebral body, both iliac bones, and in the sacrum, highly concerning for osseous metastases.   05/15/2021 Initial Diagnosis   Bone metastases ([name])   05/27/2021 PET scan   1. Widespread recurrent/metastatic disease in this patient who is  status post bilateral mastectomy. Left chest wall recurrence with  left axillary/subpectoral, supraclavicular nodal metastasis.  2. Hepatic, left adrenal, and widespread osseous metastasis.  3. Incidental findings, including: Right nephrolithiasis. Tiny  hiatal hernia.    05/27/2021 Imaging   MRI LUMBAR AND THORACIC SPINE: Thoracic spine:  Osseous metastatic disease involving each level. The most dramatic deposits are at T4 and T6 where there is extraosseous/epidural tumor. No cord compression but there  is foraminal impingement on the left at T6-7 and on the right at T3-4. Early dorsal epidural tumor at the level of T10 and T3.   Lumbar spine:  1. Widespread osseous metastatic disease with largest deposit  replacing the L2 body where there is a compression fracture with  mild height loss. Also at this level is early epidural tumor  extension likely affecting both L2-3 foramina.  2. 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    05/27/2021 Imaging   MRI HEAD WITH AND WITHOUT CONTRAST: Negative for metastatic disease to the brain or calvarium.   06/03/2021 - 06/24/2021 Chemotherapy   Patient is on Treatment Plan : BREAST Trastuzumab  q21d X 11 Cycles     06/25/2021 - 01/01/2022 Chemotherapy   Patient is  on Treatment Plan : BREAST Docetaxel  + Trastuzumab  + Pertuzumab  (THP) q21d     06/25/2021 - 01/02/2022 Chemotherapy   Patient is on Treatment Plan : BREAST Docetaxel  + Trastuzumab  + Pertuzumab  (THP) q21d     09/14/2021 Imaging   CT chest/abdomen/pelvis  IMPRESSION:  1. Interval resolution of previously seen bulky left axillary and  subpectoral lymphadenopathy. No persistently enlarged lymph nodes.  2. Multiple liver lesions are significantly diminished in size.  3. Widespread osseous metastatic disease, with an interval increase  in sclerosis of several previously lytic lesions.  4. Constellation of findings is consistent with treatment response  of metastatic disease  5. New, although age indeterminate pathologic wedge deformity of the  T6 vertebral body as well as an increased pathologic wedge deformity  of the L2 vertebral body.  6. Coronary artery disease.  Aortic Atherosclerosis (ICD10-I70.0).    01/26/2022 -  Chemotherapy   Patient is on Treatment Plan : BREAST METASTATIC Fam-Trastuzumab Deruxtecan-nxki  (Enhertu ) (5.4) q21d       INTERVAL HISTORY:  Jasmin Lewis is here today for repeat clinical assessment for metastatic HER2 receptor positive breast cancer.  Patient states that she feels *** and ***.        She denies fever, chills, night sweats, or other signs of infection. She denies cardiorespiratory and gastrointestinal issues. She  denies pain. Her appetite is *** and Her weight {Weight change:10426}.  Patient has no complaints of pain. She had a CT chest, abdomen, and pelvis done on 08/11/2023 that revealed multifocal sclerotic bone metastases are similar to previous, stable areas of compression deformity at L2 and T6, separate healing right lateral rib fracture, and the left adrenal nodule appeared slightly larger. There was no developing new lymph node enlargement, fatty liver infiltration with slightly nodular contour, vague low-attenuation liver lesion on previous was not well seen, and no new liver lesion at this time was also noted. I reviewed this report with her and her son once again. Her day 1 cycle 30 of Enhertu  is scheduled on 09/28/2023. She has a low WBC of 3.9, hemoglobin of 12.8, and platelet count of 161,000. Her CMP is normal. I will see her back in 3 weeks with CBC and CMP. She denies fever, chills, night sweats, or other signs of infection. She denies cardiorespiratory and gastrointestinal issues. She  denies pain. Her appetite is the same and Her weight has increased 1 pounds over last 4 weeks.   REVIEW OF SYSTEMS:  Review of Systems  Constitutional: Negative.  Negative for appetite change, chills, diaphoresis, fatigue, fever and unexpected weight change.  HENT:  Negative.  Negative for hearing loss, lump/mass, mouth sores, nosebleeds, sore throat, tinnitus, trouble swallowing and voice change.   Eyes:  Positive for eye problems (excessive tearing). Negative for icterus.  Respiratory: Negative.  Negative for chest tightness, cough, hemoptysis, shortness of breath and wheezing.   Cardiovascular: Negative.  Negative for chest pain, leg swelling and palpitations.  Gastrointestinal: Negative.  Negative for abdominal distention,  abdominal pain, blood in stool, constipation, diarrhea, nausea, rectal pain and vomiting.  Endocrine: Negative.  Negative for hot flashes.  Genitourinary: Negative.  Negative for bladder incontinence, difficulty urinating, dyspareunia, dysuria, frequency, hematuria, menstrual problem, nocturia, pelvic pain, vaginal bleeding and vaginal discharge.   Musculoskeletal:  Positive for arthralgias and gait problem (ambulates with a cane). Negative for back pain, flank pain, myalgias, neck pain and neck stiffness.  Skin: Negative.  Negative for itching, rash and wound.  Neurological:  Positive for extremity weakness (left  hand), gait problem (ambulates with a cane) and numbness (left hand). Negative for dizziness, headaches, light-headedness, seizures and speech difficulty.  Hematological: Negative.  Negative for adenopathy. Does not bruise/bleed easily.  Psychiatric/Behavioral: Negative.  Negative for confusion, decreased concentration, depression, sleep disturbance and suicidal ideas. The patient is not nervous/anxious.     VITALS:  There were no vitals taken for this visit.  Wt Readings from Last 3 Encounters:  09/28/23 110 lb (49.9 kg)  09/07/23 109 lb 11.2 oz (49.8 kg)  08/17/23 110 lb 4.8 oz (50 kg)    There is no height or weight on file to calculate BMI.  Performance status (ECOG): 1 - Symptomatic but completely ambulatory  PHYSICAL EXAM:  Physical Exam Vitals and nursing note reviewed.  Constitutional:      General: She is not in acute distress.    Appearance: Normal appearance. She is normal weight.  HENT:     Head: Normocephalic and atraumatic.     Right Ear: Tympanic membrane, ear canal and external ear normal. There is no impacted cerumen.     Left Ear: Tympanic membrane, ear canal and external ear normal. There is no impacted cerumen.     Nose: Nose normal. No congestion or rhinorrhea.     Mouth/Throat:     Mouth: Mucous membranes are moist.     Pharynx: Oropharynx is clear. No  oropharyngeal exudate or posterior oropharyngeal erythema.  Eyes:     General: No scleral icterus.       Right eye: No discharge.        Left eye: No discharge.     Extraocular Movements: Extraocular movements intact.     Conjunctiva/sclera: Conjunctivae normal.     Pupils: Pupils are equal, round, and reactive to light.  Neck:     Vascular: No carotid bruit.  Cardiovascular:     Rate and Rhythm: Normal rate and regular rhythm.     Pulses: Normal pulses.     Heart sounds: Normal heart sounds. No murmur heard.    No friction rub. No gallop.  Pulmonary:     Effort: Pulmonary effort is normal. No respiratory distress.     Breath sounds: Normal breath sounds. No stridor. No wheezing, rhonchi or rales.  Chest:     Chest wall: No tenderness.  Breasts:    Right: Normal. No swelling, bleeding, inverted nipple, mass, nipple discharge, skin change or tenderness.     Left: Absent.     Comments: Left mastectomy site is negative. Right breast is without masses  Abdominal:     General: Bowel sounds are normal. There is no distension.     Palpations: Abdomen is soft. There is no hepatomegaly, splenomegaly or mass.     Tenderness: There is no abdominal tenderness. There is no right CVA tenderness, left CVA tenderness, guarding or rebound.     Hernia: No hernia is present.  Musculoskeletal:        General: No swelling, tenderness or signs of injury. Normal range of motion.     Left hand: Deformity present.     Cervical back: Normal range of motion. No rigidity or tenderness.     Right lower leg: No edema.     Left lower leg: No edema.  Lymphadenopathy:     Cervical: No cervical adenopathy.     Right cervical: No superficial, deep or posterior cervical adenopathy.    Left cervical: No superficial, deep or posterior cervical adenopathy.     Upper Body:  Right upper body: No supraclavicular, axillary or pectoral adenopathy.     Left upper body: No supraclavicular, axillary or pectoral  adenopathy.     Lower Body: No right inguinal adenopathy. No left inguinal adenopathy.  Skin:    General: Skin is warm and dry.     Coloration: Skin is not jaundiced or pale.     Findings: No bruising, erythema, lesion or rash.  Neurological:     General: No focal deficit present.     Mental Status: She is alert and oriented to person, place, and time. Mental status is at baseline.     Cranial Nerves: No cranial nerve deficit.     Sensory: No sensory deficit.     Motor: No weakness.     Coordination: Coordination normal.     Gait: Gait normal.     Deep Tendon Reflexes: Reflexes normal.  Psychiatric:        Mood and Affect: Mood normal.        Behavior: Behavior normal.        Thought Content: Thought content normal.        Judgment: Judgment normal.     LABS:      Latest Ref Rng & Units 09/28/2023   12:54 PM 09/07/2023   12:48 PM 08/17/2023   12:39 PM  CBC  WBC 4.0 - 10.5 K/uL 3.9  4.3  5.3   Hemoglobin 12.0 - 15.0 g/dL 25.9  56.3  87.5   Hematocrit 36.0 - 46.0 % 37.7  38.0  39.9   Platelets 150 - 400 K/uL 161  165  179       Latest Ref Rng & Units 09/28/2023   12:54 PM 09/07/2023   12:48 PM 08/17/2023   12:39 PM  CMP  Glucose 70 - 99 mg/dL 85  97  89   BUN 8 - 23 mg/dL 19  15  21    Creatinine 0.44 - 1.00 mg/dL 6.43  3.29  5.18   Sodium 135 - 145 mmol/L 142  140  142   Potassium 3.5 - 5.1 mmol/L 4.4  4.4  4.5   Chloride 98 - 111 mmol/L 107  105  106   CO2 22 - 32 mmol/L 23  27  26    Calcium 8.9 - 10.3 mg/dL 84.1  66.0  63.0   Total Protein 6.5 - 8.1 g/dL 6.6  6.6  6.6   Total Bilirubin 0.0 - 1.2 mg/dL 0.5  0.4  0.5   Alkaline Phos 38 - 126 U/L 62  64  67   AST 15 - 41 U/L 39  43  45   ALT 0 - 44 U/L 17  17  21     Lab Results  Component Value Date   CEA1 1.8 06/03/2021   CEA 0.5 11/13/2012   /  CEA  Date Value Ref Range Status  06/03/2021 1.8 0.0 - 4.7 ng/mL Final    Comment:    (NOTE)                             Nonsmokers          <3.9                              Smokers             <5.6 [employer] Electrochemiluminescence Immunoassay (ECLIA) Values obtained with different assay methods  or kits cannot be used interchangeably.  Results cannot be interpreted as absolute evidence of the presence or absence of malignant disease. Performed At: [facility] [address] [place], [place] [id number] Pearlean Botts MD [id number]   11/13/2012 0.5 0.0 - 5.0 ng/mL Final   Lab Results  Component Value Date   LDH 186 11/12/2011   LDH 187 09/08/2010   LDH 171 09/09/2009    STUDIES:  EXAM: 08/11/2023 CT CHEST, ABDOMEN, AND PELVIS WITH CONTRAST Impression: Multifocal sclerotic bone metastases are similar to previous. Stable areas of compression deformity at L2 and T6.  Separate healing right lateral rib fracture. Left adrenal nodule appears slightly larger today. Attention on follow-up. No developing new lymph node enlargement, soft tissue mass. Fatty liver infiltration with slightly nodular contour. Vague low-attenuation liver lesion on previous is not well seen today. No new liver lesion at this time.   HISTORY:   Past Medical History:  Diagnosis Date   Arthritis    shoulder   Breast cancer ([name])    Cancer ([name])    left breast ca   Colon cancer ([name])    [name], stage II 11/12/2011   Sigmoid lesion 5.9 cm  40 nodes negative but focus of cancer in a diverticum   Pre-op CEA 5.2 with lab normal up to 2.5 resected 09/21/05   Xeloda adjuvant chemotherapy   Hypertension    Polyneuropathy 02/03/2023   Right shoulder pain 04/12/2023    Past Surgical History:  Procedure Laterality Date   COLONOSCOPY     HEMICOLECTOMY  2007   MASTECTOMY WITH AXILLARY LYMPH NODE DISSECTION Left 03/14/2019   Procedure: LEFT MASTECTOMY WITH TARGETED LYMPH NODE DISSECTION;  Surgeon: Caralyn Chandler, MD;  Location: [facility] OR;  Service: General;  Laterality: Left;   SENTINEL NODE BIOPSY Left 03/14/2019   Procedure: Left Axillary Sentinel  Lymph  Node Biopsy;  Surgeon: Caralyn Chandler, MD;  Location: [facility] OR;  Service: General;  Laterality: Left;   TONSILLECTOMY     WISDOM TOOTH EXTRACTION      Family History  Problem Relation Age of Onset   Cancer Maternal Aunt        colon cancer     Social History:  reports that she has never smoked. She has never used smokeless tobacco. She reports that she does not drink alcohol and does not use drugs.The patient is alone today.  Allergies: No Known Allergies  Current Medications: Current Outpatient Medications  Medication Sig Dispense Refill   Biotin 1 MG CAPS Take by mouth.     bisacodyl (DULCOLAX) 5 MG EC tablet Take 5 mg by mouth daily as needed for moderate constipation. 2 capsules every night     co-enzyme Q-10 30 MG capsule Take 100 mg by mouth daily.     fish oil-omega-3 fatty acids 1000 MG capsule Take 1 g by mouth daily.     gabapentin  (NEURONTIN ) 100 MG capsule Take 1 capsule (100 mg total) by mouth at bedtime. (Patient not taking: Reported on 05/02/2023) 30 capsule 5   MAGNESIUM PO Take 1 tablet by mouth every evening. 600 mg     Multiple Vitamin (MULTIVITAMIN) capsule Take 1 capsule by mouth daily.     ondansetron  (ZOFRAN ) 4 MG tablet Take 1 tablet (4 mg total) by mouth every 8 (eight) hours as needed for nausea or vomiting. (Patient not taking: Reported on 05/02/2023) 20 tablet 2   polyethylene glycol (MIRALAX / GLYCOLAX) 17 g packet Take 17 g by mouth  as needed. constipation     No current facility-administered medications for this visit.    I,Jasmine M Lassiter,acting as a scribe for Jasmin Baumgartner, MD.,have documented all relevant documentation on the behalf of Jasmin Baumgartner, MD,as directed by  Jasmin Baumgartner, MD while in the presence of Jasmin Baumgartner, MD.

## 2023-10-17 NOTE — Assessment & Plan Note (Signed)
 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  Biopsy revealed carcinoma consistent with her previous breast cancer with negative estrogen and progesterone receptors and positive HER2 receptor.  She was initially treated with docetaxel /trastuzumab  and received 10 cycles with a good response.  She then had recurrence in the left chest wall in September 2023, so was switched to Enhertu  good response.  The left chest wall is negative.

## 2023-10-17 NOTE — Progress Notes (Signed)
 [facility]  [address] [place],  [place]  [address] [phone]  Clinic Day:  10/19/2023  Referring physician: [name], MD  ASSESSMENT & PLAN:   Assessment & Plan: Malignant neoplasm metastatic to liver [facility]) Numerous heterogeneously enhancing, internally necrotic lesions in the right hepatic lobe CT in December 2023, which was consistent with metastatic disease. Largest lesion measures up to 6.7 cm and there are at least 6 lesions.  Liver biopsy was consistent with metastatic breast cancer with negative estrogen and progesterone receptors and positive HER2 receptor. She was initially treated with trastuzumab  with an excellent response with significant decrease in the size of her liver lesions and sustained response. She later had progression of this disease in the chest wall and was switched to Enhertu .  CT chest, abdomen, and pelvis in March revealed stable subtle liver lesions with at least 1 lesion is no longer visualized.   CT chest, abdomen and pelvis in October 2024 revealed a decrease in the hepatic lesions with no new lesions seen.  CT imaging in April was fairly stable except a slight increase in the left adrenal nodule. There was a stable vague liver lesion and stable bone metatasis. She continues Enhertu  every 3 weeks without significant difficulty.  Her main complaint is excessive tearing from the treatment.  She also has new left hip pain, which she attributes to arthritis.  I offered to do a x-ray for further evaluation, but she declined.  She has chronic pain due to polyneuropathy in the right hand.  She will proceed with a 31st cycle of Enhertu  today.  I will plan to see her back in 3 weeks with a CBC, comprehensive metabolic panel and echocardiogram prior to a 32nd cycle of Enhertu ..   Malignant neoplasm metastatic to bone [facility]) Bone metastases diagnosed in December 2022, for which she was placed on zoledronic  acid every 4 weeks. She is now receiving  zoledronic  acid every 6 weeks to correspond with her chemotherapy. CT imaging in April 2025 did not reveal any evidence of progressive disease.  She will be due for zoledronic  acid again in 3 weeks.  Chest wall recurrence of breast cancer, left ([name]) Left chest wall recurrence diagnosed in January 2023.  Biopsy revealed carcinoma consistent with her previous breast cancer with negative estrogen and progesterone receptors and positive HER2 receptor.  She was initially treated with docetaxel /trastuzumab  and received 10 cycles with a good response.  She then had recurrence in the left chest wall in September 2023, so was switched to Enhertu  good response.  The left chest wall is negative.  Malignant neoplasm of upper-outer quadrant of left breast in female, estrogen receptor negative ([name]) History of stage IIB (T2c N1 M0) HER2 receptor positive, ER/PR negative, left breast cancer diagnosed in June 2020.  She was treated with neoadjuvant Kadcyla /Perjeta  followed by left mastectomy.  She had a complete response in breast and nodes. She had only received two doses of adjuvant trastuzumab  before discontinuing in December 2020. She developed recurrent disease in 2022  Polyneuropathy She has had chronic left hand pain, initially thought to be related to lymphedema.  Her lymphedema was controlled. She has decreased strength and muscle wasting.  She was referred to neurology for further evaluation.  EMG revealed polyneuropathy.  She has now seen orthopedics and had an MRI of the left brachial plexus that was unremarkable, but limited due to patient motion.  She had been on gabapentin  100 mg at bedtime, but discontinued that on  her own. She saw Dr. [name] in neurology in October 2024 and underwent repeat EMG, which was more abnormal. She has been seen by Dr. [name] with [name] in [place], who did not feel surgery was a good option. The patient no longer sees occupational therapy, but does exercises at home.     The patient understands the plans discussed today and is in agreement with them.  She knows to contact our office if she develops concerns prior to her next appointment.   I provided 15 minutes of face-to-face time during this encounter and > 50% was spent counseling as documented under my assessment and plan.    Andrez DELENA Foy, PA-C  [facility] [place] - A DEPT OF [place] [address] [place] [address] Dept: [phone] Dept Fax: [phone]   Orders Placed This Encounter  Procedures   ECHOCARDIOGRAM COMPLETE    Standing Status:   Future    Expected Date:   11/07/2023    Expiration Date:   10/18/2024    Where should this test be performed:   [facility] [place]    Perflutren DEFINITY (image enhancing agent) should be administered unless hypersensitivity or allergy exist:   Administer Perflutren    Reason for exam-Echo:   Chemo  [id number]      CHIEF COMPLAINT:  CC: Recurrent hormone receptor positive breast cancer  Current Treatment: Trastuzumab  every 3 weeks/zoledronic  acid every 6 weeks  HISTORY OF PRESENT ILLNESS:   Oncology History Overview Note  Cancer Staging Malignant neoplasm of upper-outer quadrant of left breast in female, estrogen receptor negative ([name]) Staging form: Breast, AJCC 8th Edition - Clinical stage from 10/11/2018: Stage IIB (cT2, cN1, cM0, G3, ER-, PR-, HER2+) - Signed by Lanny Callander, MD on 11/02/2018 - Clinical: No stage assigned - Unsigned    History of colon cancer, stage II  09/2005 Initial Diagnosis   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 Findings were a 5.9 x 3.8 x 1.2 cm moderate to well-differentiated adenocarcinoma penetrating the muscular wall. Forty lymph nodes negative. No vascular or lymphatic invasion. Multiple diverticula with microabscess formation and inflammation. Carcinoma present in  at least 1 diverticulum. Preop CEA 5.2 with lab normal range 0 to 2.5. Preop CT scan with no obvious additional pathology.    2007 -  Chemotherapy   She received oral Xeloda chemotherapy as an adjuvant. She declined treatment on a clinical trial.    11/12/2011 Initial Diagnosis   [name], stage II   12/09/2011 Procedure   Followup colonoscopy done on 12/09/2011. She was found to have 2 polyps which were removed. Chronic diverticulosis.     Malignant neoplasm of upper-outer quadrant of left breast in female, estrogen receptor negative ([name])  09/26/2018 Mammogram   Mammogram/US  of left breast 09/26/18 IMPRESSION:  1. 2.9cm irregular mass in the upper outer left breast corresponds to the palpable abnormality. This is highly suspicious for breast carcinoma.  2. Two adjacent abnormal left axillary  Lymph nodes suspicious for metastatic adenopathy. There is a third borderline abnormal left axillary LN with a cortex thickened to 4mm.  3. Benign right breast cyst. No evidence of right breast malignancy.    10/11/2018 Cancer Staging   Staging form: Breast, AJCC 8th Edition - Clinical stage from 10/11/2018: Stage IIB (cT2, cN1, cM0, G3, ER-, PR-, HER2+) - Signed by Lanny Callander, MD on  11/02/2018   10/11/2018 Initial Biopsy   Diagnosis 10/11/18 1. Breast, left, needle core biopsy, upper outer left 2 o'clock - INVASIVE DUCTAL CARCINOMA. - DUCTAL CARCINOMA IN SITU. - LYMPHOVASCULAR INVASION IS IDENTIFIED. - SEE COMMENT. 2. Lymph node, needle/core biopsy, left axilla - INVASIVE DUCTAL CARCINOMA. - SEE COMMENT.   10/11/2018 Receptors her2   The tumor cells are POSITIVE for Her2 (3+). Estrogen Receptor: 0%, NEGATIVE Progesterone Receptor: 0%, NEGATIVE Proliferation Marker Ki67: 70%   11/02/2018 Initial Diagnosis   Malignant neoplasm of upper-outer quadrant of left breast in female, estrogen receptor negative ([name])   11/15/2018 Breast MRI   MRI breast 11/15/18  IMPRESSION: 1. 3.3 centimeter mass in  the LATERAL portion of the LEFT breast consistent with known malignancy. 2. There is significant non mass enhancement surrounding this mass and extending anteriorly into the nipple base, with largest diameter in the anterior to posterior axis, measuring 7.2 centimeters. 3. If the patient would consider breast conservation, additional MR guided core biopsies are recommended. Consider biopsy of the inferior and anterior extent of the non mass enhancement to document extent of disease. 4. Three enlarged LEFT axillary lymph nodes. 5. RIGHT breast is negative.   11/15/2018 PET scan   PET 11/15/18 IMPRESSION: Hypermetabolic left breast lesion compatible with known primary. Hypermetabolic left axillary lymph nodes are consistent with metastatic disease.   No evidence for additional hypermetabolic metastatic involvement in the neck, chest, abdomen, or pelvis.   11/17/2018 - 03/01/2019 Chemotherapy   Neo-adjuvant Kadcyla  and perjeta  q3weeks for 6 cycles starting 11/17/18. Stopped before surgery    11/29/2018 Pathology Results   Diagnosis 1. Breast, left, needle core biopsy, inferior anterior (cylinder clip) - INVASIVE DUCTAL CARCINOMA. - DUCTAL CARCINOMA IN SITU. - LYMPHOVASCULAR INVASION IS IDENTIFIED. - SEE COMMENT. 2. Breast, left, needle core biopsy, central posterior (barbell clip) - INVASIVE DUCTAL CARCINOMA. - LYMPHOVASCULAR INVASION IS IDENTIFIED.   02/12/2019 Breast MRI   IMPRESSION: 1. Complete resolution of previously identified enhancing mass and associated non mass enhancement within the left breast. This is consistent with excellent response to chemotherapy. No residual or suspicious findings are identified. 2. No MRI evidence of malignancy on the right. 3. Previously identified left axillary lymphadenopathy not definitively seen on today's study. However, this may be due to decreased field-of-view compared to prior study.     03/14/2019 Cancer Staging   Staging form:  Breast, AJCC 8th Edition - Pathologic stage from 03/14/2019: pT0, pN0, cM0, GX, ER: Unknown, PR: Unknown, HER2: Not Assessed - Signed by Lanny Callander, MD on 03/28/2019   03/14/2019 Surgery   LEFT MASTECTOMY WITH TARGETED LYMPH NODE DISSECTION and Left Axillary Sentinel Lymph  Node Biopsy by Dr Curvin 03/14/19    03/14/2019 Pathology Results   FINAL MICROSCOPIC DIAGNOSIS:   A. LYMPH NODE, LEFT, SENTINEL, BIOPSY:  - There is no evidence of carcinoma in 1 of 1 lymph node (0/1).   B. BREAST, LEFT, MASTECTOMY:  - Benign breast parenchyma with treatment-related changes.  - There is no evidence of malignancy.  - See oncology table below.   C. LYMPH NODE, LEFT #1, SENTINEL, BIOPSY:  - There is no evidence of carcinoma in 1 of 1 lymph node (0/1).   D. LYMPH NODE, LEFT #2, SENTINEL, BIOPSY:  - There is no evidence of carcinoma in 1 of 1 lymph node (0/1).     04/04/2019 - 04/25/2019 Chemotherapy   Maintenance Herceptin  injections every 3 weeks starting 04/03/19 to complete 1 year of treatment that was started in 11/2018.  Stopped after 2nd dose as she will not be repeating Echos.    06/03/2021 - 06/24/2021 Chemotherapy   Patient is on Treatment Plan : BREAST Trastuzumab  q21d X 11 Cycles     06/25/2021 - 01/01/2022 Chemotherapy   Patient is on Treatment Plan : BREAST Docetaxel  + Trastuzumab  + Pertuzumab  (THP) q21d     06/25/2021 - 01/02/2022 Chemotherapy   Patient is on Treatment Plan : BREAST Docetaxel  + Trastuzumab  + Pertuzumab  (THP) q21d     09/14/2021 Imaging   CT chest/abdomen/pelvis  IMPRESSION:  1. Interval resolution of previously seen bulky left axillary and  subpectoral lymphadenopathy. No persistently enlarged lymph nodes.  2. Multiple liver lesions are significantly diminished in size.  3. Widespread osseous metastatic disease, with an interval increase  in sclerosis of several previously lytic lesions.  4. Constellation of findings is consistent with treatment response  of metastatic  disease  5. New, although age indeterminate pathologic wedge deformity of the  T6 vertebral body as well as an increased pathologic wedge deformity  of the L2 vertebral body.  6. Coronary artery disease.  Aortic Atherosclerosis (ICD10-I70.0).    01/26/2022 -  Chemotherapy   Patient is on Treatment Plan : BREAST METASTATIC Fam-Trastuzumab Deruxtecan-nxki  (Enhertu ) (5.4) q21d     Malignant neoplasm metastatic to liver ([name])  04/17/2021 Imaging   CT ABDOMEN AND PELVIS WITH CONTRAST: -New lytic lesion involving the L2 vertebral body (6:72, 2:26) with minimal cortical breakthrough superiorly, but with preservation of vertebral body height, concerning for metastatic disease.  -There are several indeterminate hepatic lesions as described above. In addition to the lesions described above, there is an additional subtle 1.5 cm hypodensity in the hepatic dome (2:7). Given clinical history and presence of a new L2 lesion, leading differential consideration is metastatic disease. Recommend further evaluation with MRI of the abdomen with and without contrast.   05/12/2021 Imaging   MRI ABDOMEN WITH AND WITHOUT CONTRAST: Numerous heterogeneously enhancing, internally necrotic lesions in the right hepatic lobe, highly concerning for metastatic disease. Largest lesion measures up to 6.7 cm in hepatic segment VI.   Enhancing osseous lesions in the L2 vertebral body, both iliac bones, and in the sacrum, highly concerning for osseous metastases.   05/15/2021 Initial Diagnosis   Liver metastases ([name])   05/27/2021 PET scan   1. Widespread recurrent/metastatic disease in this patient who is  status post bilateral mastectomy. Left chest wall recurrence with  left axillary/subpectoral, supraclavicular nodal metastasis.  2. Hepatic, left adrenal, and widespread osseous metastasis.  3. Incidental findings, including: Right nephrolithiasis. Tiny  hiatal hernia.    05/27/2021 Imaging   MRI LUMBAR AND THORACIC  SPINE: Thoracic spine:  Osseous metastatic disease involving each level. The most dramatic deposits are at T4 and T6 where there is extraosseous/epidural tumor. No cord compression but there is foraminal impingement on the left at T6-7 and on the right at T3-4. Early dorsal epidural tumor at the level of T10 and T3.   Lumbar spine:  1. Widespread osseous metastatic disease with largest deposit  replacing the L2 body where there is a compression fracture with  mild height loss. Also at this level is early epidural tumor  extension likely affecting both L2-3 foramina.  2. 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    05/27/2021 Imaging   MRI HEAD WITH AND WITHOUT CONTRAST: Negative for metastatic disease to the brain or calvarium.   06/03/2021 - 06/24/2021 Chemotherapy   Patient is on Treatment Plan : BREAST  Trastuzumab  q21d X 11 Cycles     06/25/2021 - 01/01/2022 Chemotherapy   Patient is on Treatment Plan : BREAST Docetaxel  + Trastuzumab  + Pertuzumab  (THP) q21d     06/25/2021 - 01/02/2022 Chemotherapy   Patient is on Treatment Plan : BREAST Docetaxel  + Trastuzumab  + Pertuzumab  (THP) q21d     09/14/2021 Imaging   CT chest/abdomen/pelvis  IMPRESSION:  1. Interval resolution of previously seen bulky left axillary and  subpectoral lymphadenopathy. No persistently enlarged lymph nodes.  2. Multiple liver lesions are significantly diminished in size.  3. Widespread osseous metastatic disease, with an interval increase  in sclerosis of several previously lytic lesions.  4. Constellation of findings is consistent with treatment response  of metastatic disease  5. New, although age indeterminate pathologic wedge deformity of the  T6 vertebral body as well as an increased pathologic wedge deformity  of the L2 vertebral body.  6. Coronary artery disease.  Aortic Atherosclerosis (ICD10-I70.0).    01/26/2022 -  Chemotherapy   Patient is on Treatment Plan : BREAST  METASTATIC Fam-Trastuzumab Deruxtecan-nxki  (Enhertu ) (5.4) q21d     Malignant neoplasm metastatic to bone ([name])  04/17/2021 Imaging   CT ABDOMEN AND PELVIS WITH CONTRAST: -New lytic lesion involving the L2 vertebral body (6:72, 2:26) with minimal cortical breakthrough superiorly, but with preservation of vertebral body height, concerning for metastatic disease.  -There are several indeterminate hepatic lesions as described above. In addition to the lesions described above, there is an additional subtle 1.5 cm hypodensity in the hepatic dome (2:7). Given clinical history and presence of a new L2 lesion, leading differential consideration is metastatic disease. Recommend further evaluation with MRI of the abdomen with and without contrast.   05/12/2021 Imaging   MRI ABDOMEN WITH AND WITHOUT CONTRAST: Numerous heterogeneously enhancing, internally necrotic lesions in the right hepatic lobe, highly concerning for metastatic disease. Largest lesion measures up to 6.7 cm in hepatic segment VI.   Enhancing osseous lesions in the L2 vertebral body, both iliac bones, and in the sacrum, highly concerning for osseous metastases.   05/15/2021 Initial Diagnosis   Bone metastases ([name])   05/27/2021 PET scan   1. Widespread recurrent/metastatic disease in this patient who is  status post bilateral mastectomy. Left chest wall recurrence with  left axillary/subpectoral, supraclavicular nodal metastasis.  2. Hepatic, left adrenal, and widespread osseous metastasis.  3. Incidental findings, including: Right nephrolithiasis. Tiny  hiatal hernia.    05/27/2021 Imaging   MRI LUMBAR AND THORACIC SPINE: Thoracic spine:  Osseous metastatic disease involving each level. The most dramatic deposits are at T4 and T6 where there is extraosseous/epidural tumor. No cord compression but there is foraminal impingement on the left at T6-7 and on the right at T3-4. Early dorsal epidural tumor at the level of T10 and T3.   Lumbar  spine:  1. Widespread osseous metastatic disease with largest deposit  replacing the L2 body where there is a compression fracture with  mild height loss. Also at this level is early epidural tumor  extension likely affecting both L2-3 foramina.  2. 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    05/27/2021 Imaging   MRI HEAD WITH AND WITHOUT CONTRAST: Negative for metastatic disease to the brain or calvarium.   06/03/2021 - 06/24/2021 Chemotherapy   Patient is on Treatment Plan : BREAST Trastuzumab  q21d X 11 Cycles     06/25/2021 - 01/01/2022 Chemotherapy   Patient is on Treatment Plan : BREAST Docetaxel  + Trastuzumab  + Pertuzumab  (  THP) q21d     06/25/2021 - 01/02/2022 Chemotherapy   Patient is on Treatment Plan : BREAST Docetaxel  + Trastuzumab  + Pertuzumab  (THP) q21d     09/14/2021 Imaging   CT chest/abdomen/pelvis  IMPRESSION:  1. Interval resolution of previously seen bulky left axillary and  subpectoral lymphadenopathy. No persistently enlarged lymph nodes.  2. Multiple liver lesions are significantly diminished in size.  3. Widespread osseous metastatic disease, with an interval increase  in sclerosis of several previously lytic lesions.  4. Constellation of findings is consistent with treatment response  of metastatic disease  5. New, although age indeterminate pathologic wedge deformity of the  T6 vertebral body as well as an increased pathologic wedge deformity  of the L2 vertebral body.  6. Coronary artery disease.  Aortic Atherosclerosis (ICD10-I70.0).    01/26/2022 -  Chemotherapy   Patient is on Treatment Plan : BREAST METASTATIC Fam-Trastuzumab Deruxtecan-nxki  (Enhertu ) (5.4) q21d         INTERVAL HISTORY:   Jasmin Lewis is here today for repeat clinical assessment prior to a 31st cycle of maintenance trastuzumab .  She continues to report excessive tearing.  She did not try the Pataday drops because when she read the the package, it was for  allergies, so she thought it would not work.  She otherwise is tolerating Enhertu  without significant difficulty.  She denies fevers or chills. She she reports new left hip pain and stiffness, which she attributes to arthritis.  She has persistent bilateral hand pain with atrophy of the left hand, which is stable, but very frustrating to her as it makes it difficult for her to do her normal daily activities.  Her appetite is good. Her weight has been stable.  Her blood pressure is elevated.  She states her PCP is aware.  She does not check her blood pressure at home, but I suggested that she do that and see her PCP if it remains elevated.  REVIEW OF SYSTEMS:   Review of Systems  Constitutional:  Negative for appetite change, chills, diaphoresis, fatigue, fever and unexpected weight change.  HENT:   Negative for lump/mass, mouth sores and sore throat.   Eyes:  Positive for eye problems (excessive tearing).  Respiratory:  Negative for cough and shortness of breath.   Cardiovascular:  Negative for chest pain and leg swelling.  Gastrointestinal:  Negative for abdominal pain, constipation, diarrhea, nausea and vomiting.  Genitourinary:  Negative for difficulty urinating, dysuria, frequency and hematuria.   Musculoskeletal:  Positive for arthralgias (hands, left hip) and gait problem (walks with cane). Negative for back pain, myalgias and neck pain.  Skin:  Negative for rash.  Neurological:  Positive for gait problem (walks with cane). Negative for dizziness, extremity weakness, headaches, light-headedness and numbness.  Hematological:  Negative for adenopathy. Does not bruise/bleed easily.  Psychiatric/Behavioral:  Negative for depression and sleep disturbance. The patient is not nervous/anxious.      VITALS:   Blood pressure (!) 156/85, pulse 71, temperature 97.7 F (36.5 C), temperature source Oral, resp. rate 18, height 5' 0.25 (1.53 m), weight 109 lb (49.4 kg), SpO2 97%.  Wt Readings from Last  3 Encounters:  10/19/23 109 lb (49.4 kg)  10/19/23 109 lb (49.4 kg)  09/28/23 110 lb (49.9 kg)    Body mass index is 21.11 kg/m.  Performance status (ECOG): 2 - Symptomatic, <50% confined to bed    PHYSICAL EXAM:   Physical Exam Vitals and nursing note reviewed.  Constitutional:      General:  She is not in acute distress.    Appearance: She is normal weight. She is ill-appearing (chronically ill appearing).  HENT:     Head: Normocephalic and atraumatic.     Mouth/Throat:     Mouth: Mucous membranes are moist.     Pharynx: Oropharynx is clear. No oropharyngeal exudate or posterior oropharyngeal erythema.   Eyes:     General: No scleral icterus.    Extraocular Movements: Extraocular movements intact.     Conjunctiva/sclera: Conjunctivae normal.     Pupils: Pupils are equal, round, and reactive to light.    Cardiovascular:     Rate and Rhythm: Normal rate and regular rhythm.     Heart sounds: Normal heart sounds. No murmur heard.    No friction rub. No gallop.  Pulmonary:     Effort: Pulmonary effort is normal.     Breath sounds: Normal breath sounds. No wheezing, rhonchi or rales.  Chest:  Breasts:    Right: Normal. No swelling, bleeding, inverted nipple, mass, nipple discharge, skin change or tenderness.     Left: Absent.     Comments: Left chest wall is negative. Abdominal:     General: There is no distension.     Palpations: Abdomen is soft. There is no hepatomegaly, splenomegaly or mass.     Tenderness: There is no abdominal tenderness.   Musculoskeletal:        General: Normal range of motion.     Cervical back: Normal range of motion and neck supple. No tenderness.     Right lower leg: No edema.     Left lower leg: No edema.  Lymphadenopathy:     Cervical: No cervical adenopathy.     Upper Body:     Right upper body: No supraclavicular or axillary adenopathy.     Left upper body: No supraclavicular or axillary adenopathy.     Lower Body: No right  inguinal adenopathy. No left inguinal adenopathy.   Skin:    General: Skin is warm and dry.     Coloration: Skin is not jaundiced.     Findings: No rash.   Neurological:     Mental Status: She is alert and oriented to person, place, and time.     Cranial Nerves: No cranial nerve deficit.   Psychiatric:        Mood and Affect: Mood normal.        Behavior: Behavior normal.        Thought Content: Thought content normal.     LABS:      Latest Ref Rng & Units 10/19/2023    1:30 PM 09/28/2023   12:54 PM 09/07/2023   12:48 PM  CBC  WBC 4.0 - 10.5 K/uL 4.3  3.9  4.3   Hemoglobin 12.0 - 15.0 g/dL 87.2  87.1  87.1   Hematocrit 36.0 - 46.0 % 38.2  37.7  38.0   Platelets 150 - 400 K/uL 152  161  165       Latest Ref Rng & Units 10/19/2023    1:30 PM 09/28/2023   12:54 PM 09/07/2023   12:48 PM  CMP  Glucose 70 - 99 mg/dL 90  85  97   BUN 8 - 23 mg/dL 20  19  15    Creatinine 0.44 - 1.00 mg/dL 9.05  9.26  9.11   Sodium 135 - 145 mmol/L 141  142  140   Potassium 3.5 - 5.1 mmol/L 4.4  4.4  4.4   Chloride 98 -  111 mmol/L 107  107  105   CO2 22 - 32 mmol/L 25  23  27    Calcium 8.9 - 10.3 mg/dL 89.6  89.6  89.2   Total Protein 6.5 - 8.1 g/dL 6.6  6.6  6.6   Total Bilirubin 0.0 - 1.2 mg/dL 0.4  0.5  0.4   Alkaline Phos 38 - 126 U/L 68  62  64   AST 15 - 41 U/L 41  39  43   ALT 0 - 44 U/L 19  17  17       Lab Results  Component Value Date   CEA1 1.8 06/03/2021   CEA 0.5 11/13/2012   /  CEA  Date Value Ref Range Status  06/03/2021 1.8 0.0 - 4.7 ng/mL Final    Comment:    (NOTE)                             Nonsmokers          <3.9                             Smokers             <5.6 [employer] Electrochemiluminescence Immunoassay (ECLIA) Values obtained with different assay methods or kits cannot be used interchangeably.  Results cannot be interpreted as absolute evidence of the presence or absence of malignant disease. Performed At: [facility] [address] [place], [place] [id number] Jennette Shorter MD Ey:[id number]   11/13/2012 0.5 0.0 - 5.0 ng/mL Final   No results found for: PSA1 No results found for: CAN199 No results found for: CAN125  No results found for: TOTALPROTELP, ALBUMINELP, A1GS, A2GS, BETS, BETA2SER, GAMS, MSPIKE, SPEI No results found for: TIBC, FERRITIN, IRONPCTSAT Lab Results  Component Value Date   LDH 186 11/12/2011   LDH 187 09/08/2010   LDH 171 09/09/2009    STUDIES:   No results found.    HISTORY:   Past Medical History:  Diagnosis Date   Arthritis    shoulder   Breast cancer ([name])    Cancer ([name])    left breast ca   Colon cancer ([name])    [name], stage II 11/12/2011   Sigmoid lesion 5.9 cm  40 nodes negative but focus of cancer in a diverticum   Pre-op CEA 5.2 with lab normal up to 2.5 resected 09/21/05   Xeloda adjuvant chemotherapy   Hypertension    Polyneuropathy 02/03/2023   Right shoulder pain 04/12/2023    Past Surgical History:  Procedure Laterality Date   COLONOSCOPY     HEMICOLECTOMY  2007   MASTECTOMY WITH AXILLARY LYMPH NODE DISSECTION Left 03/14/2019   Procedure: LEFT MASTECTOMY WITH TARGETED LYMPH NODE DISSECTION;  Surgeon: Curvin Deward MOULD, MD;  Location: [facility] OR;  Service: General;  Laterality: Left;   SENTINEL NODE BIOPSY Left 03/14/2019   Procedure: Left Axillary Sentinel Lymph  Node Biopsy;  Surgeon: Curvin Deward MOULD, MD;  Location: [facility] OR;  Service: General;  Laterality: Left;   TONSILLECTOMY     WISDOM TOOTH EXTRACTION      Family History  Problem Relation Age of Onset   Cancer Maternal Aunt        colon cancer     Social History:  reports that she has never smoked. She has never used smokeless tobacco. She reports that she does not drink alcohol  and does not use drugs.The patient is alone today.  Allergies: No Known Allergies  Current Medications: Current Outpatient Medications  Medication Sig Dispense Refill   Biotin 1 MG CAPS  Take by mouth.     bisacodyl (DULCOLAX) 5 MG EC tablet Take 5 mg by mouth daily as needed for moderate constipation. 2 capsules every night     co-enzyme Q-10 30 MG capsule Take 100 mg by mouth daily.     fish oil-omega-3 fatty acids 1000 MG capsule Take 1 g by mouth daily.     gabapentin  (NEURONTIN ) 100 MG capsule Take 1 capsule (100 mg total) by mouth at bedtime. (Patient not taking: Reported on 05/02/2023) 30 capsule 5   MAGNESIUM PO Take 1 tablet by mouth every evening. 600 mg     Multiple Vitamin (MULTIVITAMIN) capsule Take 1 capsule by mouth daily.     ondansetron  (ZOFRAN ) 4 MG tablet Take 1 tablet (4 mg total) by mouth every 8 (eight) hours as needed for nausea or vomiting. (Patient not taking: Reported on 05/02/2023) 20 tablet 2   polyethylene glycol (MIRALAX / GLYCOLAX) 17 g packet Take 17 g by mouth as needed. constipation     No current facility-administered medications for this visit.   Facility-Administered Medications Ordered in Other Visits  Medication Dose Route Frequency Provider Last Rate Last Admin   fam-trastuzumab deruxtecan-nxki  (ENHERTU ) 200 mg in dextrose  5 % 100 mL chemo infusion  4.05 mg/kg (Treatment Plan Recorded) Intravenous Once [name], MD       heparin  lock flush 100 unit/mL  500 Units Intracatheter Once PRN [name], MD       sodium chloride  flush (NS) 0.9 % injection 10 mL  10 mL Intracatheter PRN [name], MD

## 2023-10-17 NOTE — Assessment & Plan Note (Signed)
 Bone metastases diagnosed in December 2022, for which she was placed on zoledronic  acid every 4 weeks. She is now receiving zoledronic  acid every 6 weeks to correspond with her chemotherapy. CT imaging in April 2025 did not reveal any evidence of progressive disease.  She will be due for zoledronic  acid again in 3 weeks.

## 2023-10-17 NOTE — Assessment & Plan Note (Signed)
 Numerous heterogeneously enhancing, internally necrotic lesions in the right hepatic lobe CT in December 2023, which was consistent with metastatic disease. Largest lesion measures up to 6.7 cm and there are at least 6 lesions.  Liver biopsy was consistent with metastatic breast cancer with negative estrogen and progesterone receptors and positive HER2 receptor. She was initially treated with trastuzumab  with an excellent response with significant decrease in the size of her liver lesions and sustained response. She later had progression of this disease in the chest wall and was switched to Enhertu .  CT chest, abdomen, and pelvis in March revealed stable subtle liver lesions with at least 1 lesion is no longer visualized.   CT chest, abdomen and pelvis in October 2024 revealed a decrease in the hepatic lesions with no new lesions seen.  CT imaging in April was fairly stable except a slight increase in the left adrenal nodule. There was a stable vague liver lesion and stable bone metatasis. She continues Enhertu  every 3 weeks without significant difficulty.  Her main complaint is excessive tearing from the treatment.  She also has new left hip pain, which she attributes to arthritis.  I offered to do a x-ray for further evaluation, but she declined.  She has chronic pain due to polyneuropathy in the right hand.  She will proceed with a 31st cycle of Enhertu  today.  I will plan to see her back in 3 weeks with a CBC, comprehensive metabolic panel and echocardiogram prior to a 32nd cycle of Enhertu .Aaron Aas

## 2023-10-19 ENCOUNTER — Inpatient Hospital Stay

## 2023-10-19 ENCOUNTER — Encounter: Payer: Self-pay | Admitting: Hematology and Oncology

## 2023-10-19 ENCOUNTER — Inpatient Hospital Stay: Attending: Hematology and Oncology | Admitting: Hematology and Oncology

## 2023-10-19 VITALS — BP 156/85 | HR 71 | Temp 97.7°F | Resp 18 | Ht 60.25 in | Wt 109.0 lb

## 2023-10-19 VITALS — Wt 109.0 lb

## 2023-10-19 DIAGNOSIS — Z5112 Encounter for antineoplastic immunotherapy: Secondary | ICD-10-CM | POA: Insufficient documentation

## 2023-10-19 DIAGNOSIS — M6250 Muscle wasting and atrophy, not elsewhere classified, unspecified site: Secondary | ICD-10-CM | POA: Insufficient documentation

## 2023-10-19 DIAGNOSIS — T451X5D Adverse effect of antineoplastic and immunosuppressive drugs, subsequent encounter: Secondary | ICD-10-CM | POA: Diagnosis not present

## 2023-10-19 DIAGNOSIS — S2231XD Fracture of one rib, right side, subsequent encounter for fracture with routine healing: Secondary | ICD-10-CM | POA: Insufficient documentation

## 2023-10-19 DIAGNOSIS — C7951 Secondary malignant neoplasm of bone: Secondary | ICD-10-CM | POA: Insufficient documentation

## 2023-10-19 DIAGNOSIS — K760 Fatty (change of) liver, not elsewhere classified: Secondary | ICD-10-CM | POA: Diagnosis not present

## 2023-10-19 DIAGNOSIS — C50412 Malignant neoplasm of upper-outer quadrant of left female breast: Secondary | ICD-10-CM | POA: Insufficient documentation

## 2023-10-19 DIAGNOSIS — Z800 Family history of malignant neoplasm of digestive organs: Secondary | ICD-10-CM | POA: Diagnosis not present

## 2023-10-19 DIAGNOSIS — E278 Other specified disorders of adrenal gland: Secondary | ICD-10-CM | POA: Diagnosis not present

## 2023-10-19 DIAGNOSIS — G629 Polyneuropathy, unspecified: Secondary | ICD-10-CM | POA: Insufficient documentation

## 2023-10-19 DIAGNOSIS — C787 Secondary malignant neoplasm of liver and intrahepatic bile duct: Secondary | ICD-10-CM

## 2023-10-19 DIAGNOSIS — I972 Postmastectomy lymphedema syndrome: Secondary | ICD-10-CM | POA: Insufficient documentation

## 2023-10-19 DIAGNOSIS — Z79899 Other long term (current) drug therapy: Secondary | ICD-10-CM | POA: Diagnosis not present

## 2023-10-19 DIAGNOSIS — C7989 Secondary malignant neoplasm of other specified sites: Secondary | ICD-10-CM

## 2023-10-19 DIAGNOSIS — Z171 Estrogen receptor negative status [ER-]: Secondary | ICD-10-CM

## 2023-10-19 DIAGNOSIS — M25552 Pain in left hip: Secondary | ICD-10-CM | POA: Diagnosis not present

## 2023-10-19 DIAGNOSIS — Z9012 Acquired absence of left breast and nipple: Secondary | ICD-10-CM | POA: Diagnosis not present

## 2023-10-19 DIAGNOSIS — Z85038 Personal history of other malignant neoplasm of large intestine: Secondary | ICD-10-CM | POA: Diagnosis not present

## 2023-10-19 DIAGNOSIS — C50912 Malignant neoplasm of unspecified site of left female breast: Secondary | ICD-10-CM | POA: Diagnosis not present

## 2023-10-19 LAB — CMP (CANCER CENTER ONLY)
ALT: 19 U/L (ref 0–44)
AST: 41 U/L (ref 15–41)
Albumin: 4.1 g/dL (ref 3.5–5.0)
Alkaline Phosphatase: 68 U/L (ref 38–126)
Anion gap: 9 (ref 5–15)
BUN: 20 mg/dL (ref 8–23)
CO2: 25 mmol/L (ref 22–32)
Calcium: 10.3 mg/dL (ref 8.9–10.3)
Chloride: 107 mmol/L (ref 98–111)
Creatinine: 0.94 mg/dL (ref 0.44–1.00)
GFR, Estimated: 57 mL/min — ABNORMAL LOW (ref >60)
Glucose, Bld: 90 mg/dL (ref 70–99)
Potassium: 4.4 mmol/L (ref 3.5–5.1)
Sodium: 141 mmol/L (ref 135–145)
Total Bilirubin: 0.4 mg/dL (ref 0.0–1.2)
Total Protein: 6.6 g/dL (ref 6.5–8.1)

## 2023-10-19 LAB — CBC WITH DIFFERENTIAL (CANCER CENTER ONLY)
Abs Immature Granulocytes: 0.01 10*3/uL (ref 0.00–0.07)
Basophils Absolute: 0 10*3/uL (ref 0.0–0.1)
Basophils Relative: 1 %
Eosinophils Absolute: 0 10*3/uL (ref 0.0–0.5)
Eosinophils Relative: 1 %
HCT: 38.2 % (ref 36.0–46.0)
Hemoglobin: 12.7 g/dL (ref 12.0–15.0)
Immature Granulocytes: 0 %
Lymphocytes Relative: 41 %
Lymphs Abs: 1.7 10*3/uL (ref 0.7–4.0)
MCH: 34.7 pg — ABNORMAL HIGH (ref 26.0–34.0)
MCHC: 33.2 g/dL (ref 30.0–36.0)
MCV: 104.4 fL — ABNORMAL HIGH (ref 80.0–100.0)
Monocytes Absolute: 0.6 10*3/uL (ref 0.1–1.0)
Monocytes Relative: 13 %
Neutro Abs: 1.9 10*3/uL (ref 1.7–7.7)
Neutrophils Relative %: 44 %
Platelet Count: 152 10*3/uL (ref 150–400)
RBC: 3.66 MIL/uL — ABNORMAL LOW (ref 3.87–5.11)
RDW: 14.1 % (ref 11.5–15.5)
WBC Count: 4.3 10*3/uL (ref 4.0–10.5)
nRBC: 0 % (ref 0.0–0.2)

## 2023-10-19 MED ORDER — HEPARIN SOD (PORK) LOCK FLUSH 100 UNIT/ML IV SOLN
500.0000 [IU] | Freq: Once | INTRAVENOUS | Status: AC | PRN
  Administered 2023-10-19: 500 [IU]

## 2023-10-19 MED ORDER — SODIUM CHLORIDE 0.9% FLUSH
10.0000 mL | INTRAVENOUS | Status: DC | PRN
  Administered 2023-10-19: 10 mL

## 2023-10-19 MED ORDER — DEXAMETHASONE SODIUM PHOSPHATE 10 MG/ML IJ SOLN
10.0000 mg | Freq: Once | INTRAMUSCULAR | Status: AC
  Administered 2023-10-19: 10 mg via INTRAVENOUS
  Filled 2023-10-19: qty 1

## 2023-10-19 MED ORDER — FAM-TRASTUZUMAB DERUXTECAN-NXKI CHEMO 100 MG IV SOLR
4.0500 mg/kg | Freq: Once | INTRAVENOUS | Status: AC
  Administered 2023-10-19: 200 mg via INTRAVENOUS
  Filled 2023-10-19: qty 10

## 2023-10-19 MED ORDER — PALONOSETRON HCL INJECTION 0.25 MG/5ML
0.2500 mg | Freq: Once | INTRAVENOUS | Status: AC
  Administered 2023-10-19: 0.25 mg via INTRAVENOUS
  Filled 2023-10-19: qty 5

## 2023-10-19 MED ORDER — ACETAMINOPHEN 325 MG PO TABS
650.0000 mg | ORAL_TABLET | Freq: Once | ORAL | Status: AC
  Administered 2023-10-19: 650 mg via ORAL
  Filled 2023-10-19: qty 2

## 2023-10-19 MED ORDER — DEXTROSE 5 % IV SOLN: Freq: Once | INTRAVENOUS | Status: AC

## 2023-10-19 MED ORDER — DIPHENHYDRAMINE HCL 25 MG PO CAPS
25.0000 mg | ORAL_CAPSULE | Freq: Once | ORAL | Status: AC
  Administered 2023-10-19: 25 mg via ORAL
  Filled 2023-10-19: qty 1

## 2023-10-19 NOTE — Assessment & Plan Note (Signed)
 She has had chronic left hand pain, initially thought to be related to lymphedema.  Her lymphedema was controlled. She has decreased strength and muscle wasting.  She was referred to neurology for further evaluation.  EMG revealed polyneuropathy.  She has now seen orthopedics and had an MRI of the left brachial plexus that was unremarkable, but limited due to patient motion.  She had been on gabapentin 100 mg at bedtime, but discontinued that on her own. She saw Dr. Dierdre Searles in neurology in October 2024 and underwent repeat EMG, which was more abnormal. She has been seen by Dr. Amanda Pea with [name] in [place], who did not feel surgery was a good option. The patient no longer sees occupational therapy, but does exercises at home.

## 2023-10-19 NOTE — Assessment & Plan Note (Signed)
 History of stage IIB (T2c N1 M0) HER2 receptor positive, ER/PR negative, left breast cancer diagnosed in June 2020.  She was treated with neoadjuvant Kadcyla /Perjeta  followed by left mastectomy.  She had a complete response in breast and nodes. She had only received two doses of adjuvant trastuzumab  before discontinuing in December 2020. She developed recurrent disease in 2022

## 2023-10-20 ENCOUNTER — Encounter: Payer: Self-pay | Admitting: Hematology and Oncology

## 2023-10-20 ENCOUNTER — Encounter: Payer: Self-pay | Admitting: Oncology

## 2023-11-03 ENCOUNTER — Ambulatory Visit: Attending: Hematology and Oncology

## 2023-11-03 DIAGNOSIS — C50912 Malignant neoplasm of unspecified site of left female breast: Secondary | ICD-10-CM | POA: Diagnosis present

## 2023-11-03 DIAGNOSIS — C7989 Secondary malignant neoplasm of other specified sites: Secondary | ICD-10-CM | POA: Insufficient documentation

## 2023-11-03 DIAGNOSIS — Z0189 Encounter for other specified special examinations: Secondary | ICD-10-CM | POA: Diagnosis not present

## 2023-11-03 DIAGNOSIS — T451X5D Adverse effect of antineoplastic and immunosuppressive drugs, subsequent encounter: Secondary | ICD-10-CM | POA: Diagnosis present

## 2023-11-03 DIAGNOSIS — C7951 Secondary malignant neoplasm of bone: Secondary | ICD-10-CM | POA: Insufficient documentation

## 2023-11-03 DIAGNOSIS — C787 Secondary malignant neoplasm of liver and intrahepatic bile duct: Secondary | ICD-10-CM | POA: Insufficient documentation

## 2023-11-04 ENCOUNTER — Encounter: Payer: Self-pay | Admitting: Oncology

## 2023-11-05 LAB — ECHOCARDIOGRAM COMPLETE
AR max vel: 1.62 cm2
AV Area VTI: 1.6 cm2
AV Area mean vel: 1.63 cm2
AV Mean grad: 3.9 mmHg
AV Peak grad: 8.1 mmHg
AV Vena cont: 0.3 cm
Ao pk vel: 1.43 m/s
Area-P 1/2: 4.36 cm2
MV VTI: 0.9 cm2
P 1/2 time: 632 ms
S' Lateral: 2.2 cm

## 2023-11-08 NOTE — Progress Notes (Signed)
 [facility]  [address] [place],  [place]  [address] [phone]  Clinic Day:  11/09/23  Referring physician: [name], MD  ASSESSMENT & PLAN:  Assessment: Malignant neoplasm metastatic to liver J[facility]) Numerous heterogeneously enhancing, internally necrotic lesions in the right hepatic lobe CT in December 2023, which was consistent with metastatic disease. Largest lesion measures up to 6.7 cm and there are at least 6 lesions.  Liver biopsy was consistent with metastatic breast cancer with negative estrogen and progesterone receptors and positive HER2 receptor. She was initially treated with trastuzumab  with an excellent response with significant decrease in the size of her liver lesions and sustained response. She later had progression of this disease in the chest wall and was switched to Enhertu .  CT chest, abdomen, and pelvis in March revealed stable subtle liver lesions with at least 1 lesion is no longer visualized. CT chest, abdomen and pelvis in October 2024 revealed a decrease in the hepatic lesions with no new lesions seen.  CT imaging in April was fairly stable except a slight increase in the left adrenal nodule. There was a stable vague liver lesion and stable bone metatasis. She continues Enhertu  every 3 weeks without significant difficulty.     Malignant neoplasm metastatic to bone Jasmin Lewis) Bone metastases diagnosed in December 2022, for which she was placed on zoledronic  acid every 4 weeks. She is now receiving zoledronic  acid every 6 weeks to correspond with her chemotherapy.  She has borderline hypercalcemia.  CT imaging in April 2025 did not reveal any evidence of progressive disease.    Chest wall recurrence of breast cancer, left ([facility]min [facility]) Left chest wall recurrence diagnosed in January 2023.  Biopsy revealed carcinoma consistent with her previous breast cancer with negative estrogen and progesterone receptors and positive HER2 receptor.  She was initially treated  with docetaxel /trastuzumab  and received 10 cycles with a good response.  She then had recurrence in the left chest wall in September 2023, so was switched to Enhertu  good response.  There is no recurrence in the left chest wall.   Malignant neoplasm of upper-outer quadrant of left breast in female, estrogen receptor negative (Jasmin Lewis) History of stage IIB (T2c N1 M0) HER2 receptor positive, ER/PR negative, left breast cancer diagnosed in June 2020.  She was treated with neoadjuvant Kadcyla /Perjeta  followed by left mastectomy.  She had a complete response in breast and nodes. She had only received two doses of adjuvant trastuzumab  before discontinuing in December 2020. She developed recurrent disease in 2022   Polyneuropathy She has had chronic left hand pain, initially thought to be related to lymphedema.  Her lymphedema was controlled. She has decreased strength and muscle wasting.  She was referred to neurology for further evaluation.  EMG revealed polyneuropathy.  She has now seen orthopedics and had an MRI of the left brachial plexus that was unremarkable, but limited due to patient motion.  She had been on gabapentin  100 mg at bedtime, but discontinued that on her own. She saw Dr. [name] in neurology in October 2024 and underwent repeat EMG, which was more abnormal. She has been seen by Dr. [name] with [name] in [name], who did not feel surgery was a good option. The patient no longer sees occupational therapy, but does exercises at home.   Plan: She informed me that her PCP recommended Pataday drops for her chronic teary eyes. I encouraged her to give this a try. She had a echocardiogram recently done on 11/03/2023 and had a ejection  fraction of 55-60% with a gls of 19.7 %. Her day 1 cycle 32 of Enhertu  is scheduled on 11/09/2023. She has a WBC 4.5, hemoglobin of 12.2, and low  platelet count of 136,000. Her CMP is normal other than a mild low total protein of 6.3. I will see her back in 3 weeks with  CBC and CMP. The patient understands the plans discussed today and is in agreement with them.  She knows to contact our office if she develops concerns prior to her next appointment.   I provided 12 minutes of face-to-face time during this encounter and > 50% was spent counseling as documented under my assessment and plan.   Jasmin VEAR Cornish, MD  [name] [address] [name] Dept: [phone] Dept Fax: [phone]   [name].   CHIEF COMPLAINT:  CC: Recurrent hormone receptor positive breast cancer  Current Treatment: Enhertu  every 3 weeks/zoledronic  acid every 6 weeks  HISTORY OF PRESENT ILLNESS:   Oncology History Overview Note  Cancer Staging Malignant neoplasm of upper-outer quadrant of left breast in female, estrogen receptor negative ([facility]) Staging form: Breast, AJCC 8th Edition - Clinical stage from 10/11/2018: Stage IIB (cT2, cN1, cM0, G3, ER-, PR-, HER2+) - Signed by Lanny Callander, MD on 11/02/2018 - Clinical: No stage assigned - Unsigned    History of colon cancer, stage II  09/2005 Initial Diagnosis   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 Findings were a 5.9 x 3.8 x 1.2 cm moderate to well-differentiated adenocarcinoma penetrating the muscular wall. Forty lymph nodes negative. No vascular or lymphatic invasion. Multiple diverticula with microabscess formation and inflammation. Carcinoma present in at least 1 diverticulum. Preop CEA 5.2 with lab normal range 0 to 2.5. Preop CT scan with no obvious additional pathology.    2007 -  Chemotherapy   She received oral Xeloda chemotherapy as an adjuvant. She declined treatment on a clinical trial.    11/12/2011 Initial Diagnosis   [name], stage II   12/09/2011 Procedure    Followup colonoscopy done on 12/09/2011. She was found to have 2 polyps which were removed. Chronic diverticulosis.     Malignant neoplasm of upper-outer quadrant of left breast in female, estrogen receptor negative (Jasmin Lewis)  09/26/2018 Mammogram   Mammogram/US  of left breast 09/26/18 IMPRESSION:  1. 2.9cm irregular mass in the upper outer left breast corresponds to the palpable abnormality. This is highly suspicious for breast carcinoma.  2. Two adjacent abnormal left axillary  Lymph nodes suspicious for metastatic adenopathy. There is a third borderline abnormal left axillary LN with a cortex thickened to 4mm.  3. Benign right breast cyst. No evidence of right breast malignancy.    10/11/2018 Cancer Staging   Staging form: Breast, AJCC 8th Edition - Clinical stage from 10/11/2018: Stage IIB (cT2, cN1, cM0, G3, ER-, PR-, HER2+) - Signed by Lanny Callander, MD on 11/02/2018   10/11/2018 Initial Biopsy   Diagnosis 10/11/18 1. Breast, left, needle core biopsy, upper outer left 2 o'clock - INVASIVE DUCTAL CARCINOMA. - DUCTAL CARCINOMA IN SITU. - LYMPHOVASCULAR INVASION IS IDENTIFIED. - SEE COMMENT. 2. Lymph node, needle/core biopsy, left axilla - INVASIVE DUCTAL CARCINOMA. - SEE COMMENT.   10/11/2018 Receptors her2   The tumor cells are POSITIVE for Her2 (  3+). Estrogen Receptor: 0%, NEGATIVE Progesterone Receptor: 0%, NEGATIVE Proliferation Marker Ki67: 70%   11/02/2018 Initial Diagnosis   Malignant neoplasm of upper-outer quadrant of left breast in female, estrogen receptor negative (Jasmin Lewis)   11/15/2018 Breast MRI   MRI breast 11/15/18  IMPRESSION: 1. 3.3 centimeter mass in the LATERAL portion of the LEFT breast consistent with known malignancy. 2. There is significant non mass enhancement surrounding this mass and extending anteriorly into the nipple base, with largest diameter in the anterior to posterior axis, measuring 7.2 centimeters. 3. If the patient would consider breast conservation, additional  MR guided core biopsies are recommended. Consider biopsy of the inferior and anterior extent of the non mass enhancement to document extent of disease. 4. Three enlarged LEFT axillary lymph nodes. 5. RIGHT breast is negative.   11/15/2018 PET scan   PET 11/15/18 IMPRESSION: Hypermetabolic left breast lesion compatible with known primary. Hypermetabolic left axillary lymph nodes are consistent with metastatic disease.   No evidence for additional hypermetabolic metastatic involvement in the neck, chest, abdomen, or pelvis.   11/17/2018 - 03/01/2019 Chemotherapy   Neo-adjuvant Kadcyla  and perjeta  q3weeks for 6 cycles starting 11/17/18. Stopped before surgery    11/29/2018 Pathology Results   Diagnosis 1. Breast, left, needle core biopsy, inferior anterior (cylinder clip) - INVASIVE DUCTAL CARCINOMA. - DUCTAL CARCINOMA IN SITU. - LYMPHOVASCULAR INVASION IS IDENTIFIED. - SEE COMMENT. 2. Breast, left, needle core biopsy, central posterior (barbell clip) - INVASIVE DUCTAL CARCINOMA. - LYMPHOVASCULAR INVASION IS IDENTIFIED.   02/12/2019 Breast MRI   IMPRESSION: 1. Complete resolution of previously identified enhancing mass and associated non mass enhancement within the left breast. This is consistent with excellent response to chemotherapy. No residual or suspicious findings are identified. 2. No MRI evidence of malignancy on the right. 3. Previously identified left axillary lymphadenopathy not definitively seen on today's study. However, this may be due to decreased field-of-view compared to prior study.     03/14/2019 Cancer Staging   Staging form: Breast, AJCC 8th Edition - Pathologic stage from 03/14/2019: pT0, pN0, cM0, GX, ER: Unknown, PR: Unknown, HER2: Not Assessed - Signed by Lanny Callander, MD on 03/28/2019   03/14/2019 Surgery   LEFT MASTECTOMY WITH TARGETED LYMPH NODE DISSECTION and Left Axillary Sentinel Lymph  Node Biopsy by Dr Curvin 03/14/19    03/14/2019 Pathology Results    FINAL MICROSCOPIC DIAGNOSIS:   A. LYMPH NODE, LEFT, SENTINEL, BIOPSY:  - There is no evidence of carcinoma in 1 of 1 lymph node (0/1).   B. BREAST, LEFT, MASTECTOMY:  - Benign breast parenchyma with treatment-related changes.  - There is no evidence of malignancy.  - See oncology table below.   C. LYMPH NODE, LEFT #1, SENTINEL, BIOPSY:  - There is no evidence of carcinoma in 1 of 1 lymph node (0/1).   D. LYMPH NODE, LEFT #2, SENTINEL, BIOPSY:  - There is no evidence of carcinoma in 1 of 1 lymph node (0/1).     04/04/2019 - 04/25/2019 Chemotherapy   Maintenance Herceptin  injections every 3 weeks starting 04/03/19 to complete 1 year of treatment that was started in 11/2018. Stopped after 2nd dose as she will not be repeating Echos.    06/03/2021 - 06/24/2021 Chemotherapy   Patient is on Treatment Plan : BREAST Trastuzumab  q21d X 11 Cycles     06/25/2021 - 01/01/2022 Chemotherapy   Patient is on Treatment Plan : BREAST Docetaxel  + Trastuzumab  + Pertuzumab  (THP) q21d     06/25/2021 - 01/02/2022 Chemotherapy  Patient is on Treatment Plan : BREAST Docetaxel  + Trastuzumab  + Pertuzumab  (THP) q21d     09/14/2021 Imaging   CT chest/abdomen/pelvis  IMPRESSION:  1. Interval resolution of previously seen bulky left axillary and  subpectoral lymphadenopathy. No persistently enlarged lymph nodes.  2. Multiple liver lesions are significantly diminished in size.  3. Widespread osseous metastatic disease, with an interval increase  in sclerosis of several previously lytic lesions.  4. Constellation of findings is consistent with treatment response  of metastatic disease  5. New, although age indeterminate pathologic wedge deformity of the  T6 vertebral body as well as an increased pathologic wedge deformity  of the L2 vertebral body.  6. Coronary artery disease.  Aortic Atherosclerosis (ICD10-I70.0).    01/26/2022 -  Chemotherapy   Patient is on Treatment Plan : BREAST METASTATIC Fam-Trastuzumab   Deruxtecan-nxki (Enhertu ) (5.4) q21d     Malignant neoplasm metastatic to liver ([facility])  04/17/2021 Imaging   CT ABDOMEN AND PELVIS WITH CONTRAST: -New lytic lesion involving the L2 vertebral body (6:72, 2:26) with minimal cortical breakthrough superiorly, but with preservation of vertebral body height, concerning for metastatic disease.  -There are several indeterminate hepatic lesions as described above. In addition to the lesions described above, there is an additional subtle 1.5 cm hypodensity in the hepatic dome (2:7). Given clinical history and presence of a new L2 lesion, leading differential consideration is metastatic disease. Recommend further evaluation with MRI of the abdomen with and without contrast.   05/12/2021 Imaging   MRI ABDOMEN WITH AND WITHOUT CONTRAST: Numerous heterogeneously enhancing, internally necrotic lesions in the right hepatic lobe, highly concerning for metastatic disease. Largest lesion measures up to 6.7 cm in hepatic segment VI.   Enhancing osseous lesions in the L2 vertebral body, both iliac bones, and in the sacrum, highly concerning for osseous metastases.   05/15/2021 Initial Diagnosis   Liver metastases (Jasmin Lewis)   05/27/2021 PET scan   1. Widespread recurrent/metastatic disease in this patient who is  status post bilateral mastectomy. Left chest wall recurrence with  left axillary/subpectoral, supraclavicular nodal metastasis.  2. Hepatic, left adrenal, and widespread osseous metastasis.  3. Incidental findings, including: Right nephrolithiasis. Tiny  hiatal hernia.    05/27/2021 Imaging   MRI LUMBAR AND THORACIC SPINE: Thoracic spine:  Osseous metastatic disease involving each level. The most dramatic deposits are at T4 and T6 where there is extraosseous/epidural tumor. No cord compression but there is foraminal impingement on the left at T6-7 and on the right at T3-4. Early dorsal epidural tumor at the level of T10 and T3.   Lumbar spine:  1. Widespread  osseous metastatic disease with largest deposit  replacing the L2 body where there is a compression fracture with  mild height loss. Also at this level is early epidural tumor  extension likely affecting both L2-3 foramina.  2. 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    05/27/2021 Imaging   MRI HEAD WITH AND WITHOUT CONTRAST: Negative for metastatic disease to the brain or calvarium.   06/03/2021 - 06/24/2021 Chemotherapy   Patient is on Treatment Plan : BREAST Trastuzumab  q21d X 11 Cycles     06/25/2021 - 01/01/2022 Chemotherapy   Patient is on Treatment Plan : BREAST Docetaxel  + Trastuzumab  + Pertuzumab  (THP) q21d     06/25/2021 - 01/02/2022 Chemotherapy   Patient is on Treatment Plan : BREAST Docetaxel  + Trastuzumab  + Pertuzumab  (THP) q21d     09/14/2021 Imaging   CT chest/abdomen/pelvis  IMPRESSION:  1. Interval resolution of previously seen bulky left axillary and  subpectoral lymphadenopathy. No persistently enlarged lymph nodes.  2. Multiple liver lesions are significantly diminished in size.  3. Widespread osseous metastatic disease, with an interval increase  in sclerosis of several previously lytic lesions.  4. Constellation of findings is consistent with treatment response  of metastatic disease  5. New, although age indeterminate pathologic wedge deformity of the  T6 vertebral body as well as an increased pathologic wedge deformity  of the L2 vertebral body.  6. Coronary artery disease.  Aortic Atherosclerosis (ICD10-I70.0).    01/26/2022 -  Chemotherapy   Patient is on Treatment Plan : BREAST METASTATIC Fam-Trastuzumab Deruxtecan-nxki  (Enhertu ) (5.4) q21d     Malignant neoplasm metastatic to bone (Jasmin Lewis)  04/17/2021 Imaging   CT ABDOMEN AND PELVIS WITH CONTRAST: -New lytic lesion involving the L2 vertebral body (6:72, 2:26) with minimal cortical breakthrough superiorly, but with preservation of vertebral body height, concerning for metastatic  disease.  -There are several indeterminate hepatic lesions as described above. In addition to the lesions described above, there is an additional subtle 1.5 cm hypodensity in the hepatic dome (2:7). Given clinical history and presence of a new L2 lesion, leading differential consideration is metastatic disease. Recommend further evaluation with MRI of the abdomen with and without contrast.   05/12/2021 Imaging   MRI ABDOMEN WITH AND WITHOUT CONTRAST: Numerous heterogeneously enhancing, internally necrotic lesions in the right hepatic lobe, highly concerning for metastatic disease. Largest lesion measures up to 6.7 cm in hepatic segment VI.   Enhancing osseous lesions in the L2 vertebral body, both iliac bones, and in the sacrum, highly concerning for osseous metastases.   05/15/2021 Initial Diagnosis   Bone metastases (Jasmin Lewis)   05/27/2021 PET scan   1. Widespread recurrent/metastatic disease in this patient who is  status post bilateral mastectomy. Left chest wall recurrence with  left axillary/subpectoral, supraclavicular nodal metastasis.  2. Hepatic, left adrenal, and widespread osseous metastasis.  3. Incidental findings, including: Right nephrolithiasis. Tiny  hiatal hernia.    05/27/2021 Imaging   MRI LUMBAR AND THORACIC SPINE: Thoracic spine:  Osseous metastatic disease involving each level. The most dramatic deposits are at T4 and T6 where there is extraosseous/epidural tumor. No cord compression but there is foraminal impingement on the left at T6-7 and on the right at T3-4. Early dorsal epidural tumor at the level of T10 and T3.   Lumbar spine:  1. Widespread osseous metastatic disease with largest deposit  replacing the L2 body where there is a compression fracture with  mild height loss. Also at this level is early epidural tumor  extension likely affecting both L2-3 foramina.  2. 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    05/27/2021 Imaging   MRI  HEAD WITH AND WITHOUT CONTRAST: Negative for metastatic disease to the brain or calvarium.   06/03/2021 - 06/24/2021 Chemotherapy   Patient is on Treatment Plan : BREAST Trastuzumab  q21d X 11 Cycles     06/25/2021 - 01/01/2022 Chemotherapy   Patient is on Treatment Plan : BREAST Docetaxel  + Trastuzumab  + Pertuzumab  (THP) q21d     06/25/2021 - 01/02/2022 Chemotherapy   Patient is on Treatment Plan : BREAST Docetaxel  + Trastuzumab  + Pertuzumab  (THP) q21d     09/14/2021 Imaging   CT chest/abdomen/pelvis  IMPRESSION:  1. Interval resolution of previously seen bulky left axillary and  subpectoral lymphadenopathy. No persistently enlarged lymph nodes.  2. Multiple liver lesions are significantly diminished in  size.  3. Widespread osseous metastatic disease, with an interval increase  in sclerosis of several previously lytic lesions.  4. Constellation of findings is consistent with treatment response  of metastatic disease  5. New, although age indeterminate pathologic wedge deformity of the  T6 vertebral body as well as an increased pathologic wedge deformity  of the L2 vertebral body.  6. Coronary artery disease.  Aortic Atherosclerosis (ICD10-I70.0).    01/26/2022 -  Chemotherapy   Patient is on Treatment Plan : BREAST METASTATIC Fam-Trastuzumab Deruxtecan-nxki  (Enhertu ) (5.4) q21d         INTERVAL HISTORY:  Jasmin Lewis is here today for repeat clinical assessment for her recurrent hormone receptor positive breast cancer. Patient states that she feels ok but complains of chronic teary eyes, arthritis of the left leg and atrophy of the left hand. She informed me that her PCP recommended Pataday drops for her chronic teary eyes. I encouraged her to give this a try. She had a echocardiogram recently done on 11/03/2023 and had a ejection fraction of 55-60% with a gls of 19.7 %. Her day 1 cycle 32 of Enhertu  is scheduled on 11/09/2023. She has a WBC 4.5, hemoglobin of 12.2, and low  platelet count of  136,000. Her CMP is normal other than a mild low total protein of 6.3. I will see her back in 3 weeks with CBC and CMP. She denies fever, chills, night sweats, or other signs of infection. She denies cardiorespiratory and gastrointestinal issues. She  denies pain. Her appetite is good and Her weight has increased 1 pounds over last 3 weeks.   REVIEW OF SYSTEMS:  Review of Systems  Constitutional:  Negative for appetite change, chills, diaphoresis, fatigue, fever and unexpected weight change.  HENT:  Negative.  Negative for lump/mass, mouth sores, nosebleeds, sore throat, tinnitus, trouble swallowing and voice change.   Eyes:  Positive for eye problems (excessive tearing).  Respiratory: Negative.  Negative for chest tightness, cough, hemoptysis, shortness of breath and wheezing.   Cardiovascular: Negative.  Negative for chest pain, leg swelling and palpitations.  Gastrointestinal: Negative.  Negative for abdominal distention, abdominal pain, blood in stool, constipation, diarrhea, nausea, rectal pain and vomiting.  Endocrine: Negative.   Genitourinary: Negative.  Negative for bladder incontinence, difficulty urinating, dyspareunia, dysuria, frequency, hematuria, menstrual problem, nocturia, pelvic pain, vaginal bleeding and vaginal discharge.   Musculoskeletal:  Positive for arthralgias (hands, left hip) and gait problem (walks with cane). Negative for back pain, flank pain, myalgias, neck pain and neck stiffness.       Arthritis of the left leg  Skin: Negative.  Negative for itching, rash and wound.  Neurological:  Positive for extremity weakness (atrophy and severe left hand weakness) and gait problem (walks with cane). Negative for dizziness, headaches, light-headedness, numbness, seizures and speech difficulty.  Hematological: Negative.  Negative for adenopathy. Does not bruise/bleed easily.  Psychiatric/Behavioral: Negative.  Negative for confusion, decreased concentration, depression, sleep  disturbance and suicidal ideas. The patient is not nervous/anxious.     VITALS:  Blood pressure (!) 158/70, pulse 73, temperature 98.2 F (36.8 C), temperature source Oral, resp. rate 16, height 5' 0.25 (1.53 m), weight 110 lb (49.9 kg), SpO2 97%.  Wt Readings from Last 3 Encounters:  11/09/23 110 lb (49.9 kg)  10/19/23 109 lb (49.4 kg)  10/19/23 109 lb (49.4 kg)  Body mass index is 21.3 kg/m.  Performance status (ECOG): 1 - Symptomatic but completely ambulatory  PHYSICAL EXAM:   Physical Exam Vitals and nursing  note reviewed.  Constitutional:      General: She is not in acute distress.    Appearance: Normal appearance. She is normal weight. She is not ill-appearing, toxic-appearing or diaphoretic.  HENT:     Head: Normocephalic and atraumatic.     Right Ear: Tympanic membrane, ear canal and external ear normal. There is no impacted cerumen.     Left Ear: Tympanic membrane, ear canal and external ear normal. There is no impacted cerumen.     Nose: Nose normal. No congestion or rhinorrhea.     Mouth/Throat:     Mouth: Mucous membranes are moist.     Pharynx: Oropharynx is clear. No oropharyngeal exudate or posterior oropharyngeal erythema.  Eyes:     General: No scleral icterus.       Right eye: No discharge.        Left eye: No discharge.     Extraocular Movements: Extraocular movements intact.     Conjunctiva/sclera: Conjunctivae normal.     Pupils: Pupils are equal, round, and reactive to light.  Neck:     Vascular: No carotid bruit.  Cardiovascular:     Rate and Rhythm: Normal rate and regular rhythm.     Pulses: Normal pulses.     Heart sounds: Normal heart sounds. No murmur heard.    No friction rub. No gallop.  Pulmonary:     Effort: Pulmonary effort is normal. No respiratory distress.     Breath sounds: Normal breath sounds. No stridor. No wheezing, rhonchi or rales.  Chest:     Chest wall: No tenderness.  Breasts:    Right: Normal. No swelling, bleeding,  inverted nipple, mass, nipple discharge, skin change or tenderness.     Left: Absent.     Comments: Left mastectomy site is negative. Right breast is without masses  Small raised red area in the lower sternum Abdominal:     General: There is no distension.     Palpations: Abdomen is soft. There is no hepatomegaly, splenomegaly or mass.     Tenderness: There is no abdominal tenderness. There is no right CVA tenderness, left CVA tenderness, guarding or rebound.     Hernia: No hernia is present.  Musculoskeletal:        General: No swelling, tenderness, deformity or signs of injury. Normal range of motion.     Cervical back: Normal range of motion and neck supple. No rigidity or tenderness.     Right lower leg: No edema.     Left lower leg: No edema.  Lymphadenopathy:     Cervical: No cervical adenopathy.     Upper Body:     Right upper body: No supraclavicular or axillary adenopathy.     Left upper body: No supraclavicular or axillary adenopathy.     Lower Body: No right inguinal adenopathy. No left inguinal adenopathy.  Skin:    General: Skin is warm and dry.     Coloration: Skin is not jaundiced or pale.     Findings: No bruising, erythema, lesion or rash.  Neurological:     General: No focal deficit present.     Mental Status: She is alert and oriented to person, place, and time. Mental status is at baseline.     Cranial Nerves: No cranial nerve deficit.     Sensory: No sensory deficit.     Motor: Atrophy (left hand) present. No weakness.     Coordination: Coordination normal.     Gait: Gait normal.  Deep Tendon Reflexes: Reflexes normal.  Psychiatric:        Mood and Affect: Mood normal.        Behavior: Behavior normal.        Thought Content: Thought content normal.        Judgment: Judgment normal.     LABS:      Latest Ref Rng & Units 11/09/2023    1:04 PM 10/19/2023    1:30 PM 09/28/2023   12:54 PM  CBC  WBC 4.0 - 10.5 K/uL 4.5  4.3  3.9   Hemoglobin 12.0 -  15.0 g/dL 87.7  87.2  87.1   Hematocrit 36.0 - 46.0 % 37.3  38.2  37.7   Platelets 150 - 400 K/uL 136  152  161       Latest Ref Rng & Units 11/09/2023    1:04 PM 10/19/2023    1:30 PM 09/28/2023   12:54 PM  CMP  Glucose 70 - 99 mg/dL 92  90  85   BUN 8 - 23 mg/dL 17  20  19    Creatinine 0.44 - 1.00 mg/dL 9.22  9.05  9.26   Sodium 135 - 145 mmol/L 141  141  142   Potassium 3.5 - 5.1 mmol/L 4.3  4.4  4.4   Chloride 98 - 111 mmol/L 107  107  107   CO2 22 - 32 mmol/L 25  25  23    Calcium 8.9 - 10.3 mg/dL 9.8  89.6  89.6   Total Protein 6.5 - 8.1 g/dL 6.3  6.6  6.6   Total Bilirubin 0.0 - 1.2 mg/dL 0.4  0.4  0.5   Alkaline Phos 38 - 126 U/L 57  68  62   AST 15 - 41 U/L 38  41  39   ALT 0 - 44 U/L 18  19  17     Lab Results  Component Value Date   CEA1 1.8 06/03/2021   CEA 0.5 11/13/2012   /  CEA  Date Value Ref Range Status  06/03/2021 1.8 0.0 - 4.7 ng/mL Final    Comment:    (NOTE)                             Nonsmokers          <3.9                             Smokers             <5.6 [employer] Electrochemiluminescence Immunoassay (ECLIA) Values obtained with different assay methods or kits cannot be used interchangeably.  Results cannot be interpreted as absolute evidence of the presence or absence of malignant disease. Performed At: [facility] [address] [place], [place] [id number] Jennette Shorter MD Ey:[id number]   11/13/2012 0.5 0.0 - 5.0 ng/mL Final   No results found for: PSA1 No results found for: CAN199 No results found for: CAN125  No results found for: TOTALPROTELP, ALBUMINELP, A1GS, A2GS, BETS, BETA2SER, GAMS, MSPIKE, SPEI No results found for: TIBC, FERRITIN, IRONPCTSAT Lab Results  Component Value Date   LDH 186 11/12/2011   LDH 187 09/08/2010   LDH 171 09/09/2009    STUDIES:   ECHOCARDIOGRAM COMPLETE Result Date: 11/05/2023    ECHOCARDIOGRAM REPORT   Patient Name:   NAIMA VELDHUIZEN Date of Exam:  11/03/2023 Medical Rec #:  [id number]  Height:       60.2 in Accession #:    [id number]        Weight:       109.0 lb Date of Birth:  03-Sep-1931         BSA:          1.446 m Patient Age:    89 years          BP:           156/85 mmHg Patient Gender: F                 HR:           67 bpm. Exam Location:  [place] Procedure: 2D Echo, Color Doppler, Limited Color Doppler and Strain Analysis            (Both Spectral and Color Flow Doppler were utilized during            procedure). Indications:    Malignant neoplasm metastatic to liver ([facility]) [C78.7                 (ICD-10-CM)]; Malignant neoplasm metastatic to bone ([facility])                 [C79.51 (ICD-10-CM)]; Chest wall recurrence of breast cancer,                 left ([facility]) [C50.912, C79.89 (ICD-10-CM)]; Adverse effect of                 chemotherapy, subsequent encounter [T45.1X5D (ICD-10-CM)]  History:        Patient has prior history of Echocardiogram examinations, most                 recent 05/24/2023. Risk Factors:Hypertension.;                 Medications:Chemotherapy.  Sonographer:    Saddie Chimes Referring Phys: [id number] KELLI A MOSHER IMPRESSIONS  1. Left ventricular ejection fraction, by estimation, is 55 to 60%. The left ventricle has normal function. The left ventricle has no regional wall motion abnormalities. There is mild left ventricular hypertrophy. Left ventricular diastolic parameters are indeterminate. The average left ventricular global longitudinal strain is -19.7 %. The global longitudinal strain is normal.  2. Right ventricular systolic function is normal. The right ventricular size is normal. There is mildly elevated pulmonary artery systolic pressure. The estimated right ventricular systolic pressure is 37.1 mmHg.  3. The mitral valve is degenerative. Mild mitral valve regurgitation. No evidence of mitral stenosis.  4. The aortic valve is tricuspid. There is moderate calcification of the aortic valve. Aortic valve regurgitation is mild.  No aortic stenosis is present.  5. The inferior vena cava is normal in size with greater than 50% respiratory variability, suggesting right atrial pressure of 3 mmHg. Comparison(s): No signficant change in comparison to prior TTE from 05/24/2023. FINDINGS  Left Ventricle: Left ventricular ejection fraction, by estimation, is 55 to 60%. The left ventricle has normal function. The left ventricle has no regional wall motion abnormalities. The average left ventricular global longitudinal strain is -19.7 %. Strain was performed and the global longitudinal strain is normal. The left ventricular internal cavity size was normal in size. There is mild left ventricular hypertrophy. Left ventricular diastolic parameters are indeterminate. Right Ventricle: The right ventricular size is normal. Right vetricular wall thickness was not well visualized. Right ventricular systolic function is normal. There is mildly elevated pulmonary artery systolic pressure. The tricuspid  regurgitant velocity  is 2.92 m/s, and with an assumed right atrial pressure of 3 mmHg, the estimated right ventricular systolic pressure is 37.1 mmHg. Left Atrium: Left atrial size was normal in size. Right Atrium: Right atrial size was normal in size. Pericardium: There is no evidence of pericardial effusion. Mitral Valve: The mitral valve is degenerative in appearance. There is mild thickening of the mitral valve leaflet(s). There is mild calcification of the mitral valve leaflet(s). Mild mitral valve regurgitation. No evidence of mitral valve stenosis. MV peak gradient, 4.2 mmHg. The mean mitral valve gradient is 1.0 mmHg. Tricuspid Valve: The tricuspid valve is grossly normal. Tricuspid valve regurgitation is mild . No evidence of tricuspid stenosis. Aortic Valve: The aortic valve is tricuspid. There is moderate calcification of the aortic valve. Aortic valve regurgitation is mild. Aortic regurgitation PHT measures 632 msec. No aortic stenosis is present.  Aortic valve mean gradient measures 3.9 mmHg.  Aortic valve peak gradient measures 8.1 mmHg. Aortic valve area, by VTI measures 1.60 cm. Pulmonic Valve: The pulmonic valve was normal in structure. Pulmonic valve regurgitation is mild. No evidence of pulmonic stenosis. Aorta: The aortic root and ascending aorta are structurally normal, with no evidence of dilitation. Venous: The inferior vena cava is normal in size with greater than 50% respiratory variability, suggesting right atrial pressure of 3 mmHg. IAS/Shunts: The interatrial septum was not well visualized.  LEFT VENTRICLE PLAX 2D LVIDd:         4.10 cm   Diastology LVIDs:         2.20 cm   LV e' medial:    6.86 cm/s LV PW:         1.00 cm   LV E/e' medial:  10.4 LV IVS:        0.70 cm   LV e' lateral:   7.89 cm/s LVOT diam:     1.70 cm   LV E/e' lateral: 9.0 LV SV:         43 LV SV Index:   30        2D Longitudinal Strain LVOT Area:     2.27 cm  2D Strain GLS Avg:     -19.7 %  RIGHT VENTRICLE RV Basal diam:  3.20 cm RV Mid diam:    2.60 cm TAPSE (M-mode): 2.5 cm LEFT ATRIUM           Index LA diam:      2.70 cm 1.87 cm/m LA Vol (A4C): 7.6 ml  5.24 ml/m  AORTIC VALVE                    PULMONIC VALVE AV Area (Vmax):    1.62 cm     PV Vmax:       1.24 m/s AV Area (Vmean):   1.63 cm     PV Vmean:      81.300 cm/s AV Area (VTI):     1.60 cm     PV VTI:        0.228 m AV Vmax:           142.72 cm/s  PV Peak grad:  6.2 mmHg AV Vmean:          91.678 cm/s  PV Mean grad:  3.0 mmHg AV VTI:            0.267 m AV Peak Grad:      8.1 mmHg AV Mean Grad:      3.9 mmHg LVOT Vmax:  102.00 cm/s LVOT Vmean:        66.000 cm/s LVOT VTI:          0.188 m LVOT/AV VTI ratio: 0.70 AI PHT:            632 msec AR Vena Contracta: 0.30 cm  AORTA Ao Asc diam: 3.10 cm MITRAL VALVE                TRICUSPID VALVE MV Area (PHT): 4.36 cm     TR Peak grad:   34.1 mmHg MV Area VTI:   0.90 cm     TR Vmax:        292.00 cm/s MV Peak grad:  4.2 mmHg MV Mean grad:  1.0 mmHg      SHUNTS MV Vmax:       1.02 m/s     Systemic VTI:  0.19 m MV Vmean:      55.4 cm/s    Systemic Diam: 1.70 cm MV Decel Time: 174 msec MV E velocity: 71.30 cm/s MV A velocity: 100.00 cm/s MV E/A ratio:  0.71 Sreedhar reddy Madireddy Electronically signed by Alean reddy Madireddy Signature Date/Time: 11/05/2023/4:50:18 PM    Final     EXAM: 08/11/2023 CT CHEST, ABDOMEN, AND PELVIS WITH CONTRAST IMPRESSION: Multifocal sclerotic bone metastases are similar to previous. Stable areas of compression deformity at L2 and T6. Separate healing right lateral rib fracture. Left adrenal nodule appears slightly larger today. Attention on follow-up. No developing new lymph node enlargement, soft tissue mass. Fatty liver infiltration with slightly nodular contour. Vague low-attenuation liver lesion on previous is not well seen today. No new liver lesion at this time.  HISTORY:   Past Medical History:  Diagnosis Date   Arthritis    shoulder   Breast cancer ([facility])    Cancer ([facility])    left breast ca   Colon cancer ([facility])    [name], stage II 11/12/2011   Sigmoid lesion 5.9 cm  40 nodes negative but focus of cancer in a diverticum   Pre-op CEA 5.2 with lab normal up to 2.5 resected 09/21/05   Xeloda adjuvant chemotherapy   Hypertension    Polyneuropathy 02/03/2023   Right shoulder pain 04/12/2023    Past Surgical History:  Procedure Laterality Date   COLONOSCOPY     HEMICOLECTOMY  2007   MASTECTOMY WITH AXILLARY LYMPH NODE DISSECTION Left 03/14/2019   Procedure: LEFT MASTECTOMY WITH TARGETED LYMPH NODE DISSECTION;  Surgeon: Curvin Deward MOULD, MD;  Location: [facility] OR;  Service: General;  Laterality: Left;   SENTINEL NODE BIOPSY Left 03/14/2019   Procedure: Left Axillary Sentinel Lymph  Node Biopsy;  Surgeon: Curvin Deward MOULD, MD;  Location: [facility] OR;  Service: General;  Laterality: Left;   TONSILLECTOMY     WISDOM TOOTH EXTRACTION      Family History  Problem Relation Age of Onset   Cancer Maternal Aunt         colon cancer     Social History:  reports that she has never smoked. She has never used smokeless tobacco. She reports that she does not drink alcohol and does not use drugs.The patient is alone today.  Allergies: No Known Allergies  Current Medications: Current Outpatient Medications  Medication Sig Dispense Refill   Biotin 1 MG CAPS Take by mouth.     bisacodyl (DULCOLAX) 5 MG EC tablet Take 5 mg by mouth daily as needed for moderate constipation. 2 capsules every night     co-enzyme Q-10  30 MG capsule Take 100 mg by mouth daily.     fish oil-omega-3 fatty acids 1000 MG capsule Take 1 g by mouth daily.     gabapentin  (NEURONTIN ) 100 MG capsule Take 1 capsule (100 mg total) by mouth at bedtime. (Patient not taking: Reported on 05/02/2023) 30 capsule 5   MAGNESIUM PO Take 1 tablet by mouth every evening. 600 mg     Multiple Vitamin (MULTIVITAMIN) capsule Take 1 capsule by mouth daily.     ondansetron  (ZOFRAN ) 4 MG tablet Take 1 tablet (4 mg total) by mouth every 8 (eight) hours as needed for nausea or vomiting. (Patient not taking: Reported on 05/02/2023) 20 tablet 2   polyethylene glycol (MIRALAX / GLYCOLAX) 17 g packet Take 17 g by mouth as needed. constipation     No current facility-administered medications for this visit.    I,Jasmine M Lassiter,acting as a scribe for Jasmin VEAR Cornish, MD.,have documented all relevant documentation on the behalf of Jasmin VEAR Cornish, MD,as directed by  Jasmin VEAR Cornish, MD while in the presence of Jasmin VEAR Cornish, MD.

## 2023-11-09 ENCOUNTER — Inpatient Hospital Stay

## 2023-11-09 ENCOUNTER — Encounter: Payer: Self-pay | Admitting: Oncology

## 2023-11-09 ENCOUNTER — Inpatient Hospital Stay: Attending: Hematology and Oncology | Admitting: Oncology

## 2023-11-09 VITALS — BP 158/70 | HR 73 | Temp 98.2°F | Resp 16 | Ht 60.25 in | Wt 110.0 lb

## 2023-11-09 DIAGNOSIS — C7951 Secondary malignant neoplasm of bone: Secondary | ICD-10-CM | POA: Insufficient documentation

## 2023-11-09 DIAGNOSIS — Z1722 Progesterone receptor negative status: Secondary | ICD-10-CM | POA: Diagnosis not present

## 2023-11-09 DIAGNOSIS — C787 Secondary malignant neoplasm of liver and intrahepatic bile duct: Secondary | ICD-10-CM

## 2023-11-09 DIAGNOSIS — G629 Polyneuropathy, unspecified: Secondary | ICD-10-CM | POA: Diagnosis not present

## 2023-11-09 DIAGNOSIS — Z171 Estrogen receptor negative status [ER-]: Secondary | ICD-10-CM

## 2023-11-09 DIAGNOSIS — Z85038 Personal history of other malignant neoplasm of large intestine: Secondary | ICD-10-CM | POA: Insufficient documentation

## 2023-11-09 DIAGNOSIS — Z9012 Acquired absence of left breast and nipple: Secondary | ICD-10-CM | POA: Diagnosis not present

## 2023-11-09 DIAGNOSIS — Z5112 Encounter for antineoplastic immunotherapy: Secondary | ICD-10-CM | POA: Diagnosis present

## 2023-11-09 DIAGNOSIS — K760 Fatty (change of) liver, not elsewhere classified: Secondary | ICD-10-CM | POA: Diagnosis not present

## 2023-11-09 DIAGNOSIS — Z79899 Other long term (current) drug therapy: Secondary | ICD-10-CM | POA: Insufficient documentation

## 2023-11-09 DIAGNOSIS — C50412 Malignant neoplasm of upper-outer quadrant of left female breast: Secondary | ICD-10-CM | POA: Diagnosis present

## 2023-11-09 DIAGNOSIS — E278 Other specified disorders of adrenal gland: Secondary | ICD-10-CM | POA: Diagnosis not present

## 2023-11-09 DIAGNOSIS — M6250 Muscle wasting and atrophy, not elsewhere classified, unspecified site: Secondary | ICD-10-CM | POA: Diagnosis not present

## 2023-11-09 DIAGNOSIS — S2231XD Fracture of one rib, right side, subsequent encounter for fracture with routine healing: Secondary | ICD-10-CM | POA: Insufficient documentation

## 2023-11-09 DIAGNOSIS — I972 Postmastectomy lymphedema syndrome: Secondary | ICD-10-CM | POA: Insufficient documentation

## 2023-11-09 DIAGNOSIS — Z800 Family history of malignant neoplasm of digestive organs: Secondary | ICD-10-CM | POA: Diagnosis not present

## 2023-11-09 LAB — CMP (CANCER CENTER ONLY)
ALT: 18 U/L (ref 0–44)
AST: 38 U/L (ref 15–41)
Albumin: 4.1 g/dL (ref 3.5–5.0)
Alkaline Phosphatase: 57 U/L (ref 38–126)
Anion gap: 9 (ref 5–15)
BUN: 17 mg/dL (ref 8–23)
CO2: 25 mmol/L (ref 22–32)
Calcium: 9.8 mg/dL (ref 8.9–10.3)
Chloride: 107 mmol/L (ref 98–111)
Creatinine: 0.77 mg/dL (ref 0.44–1.00)
GFR, Estimated: >60 mL/min (ref >60)
Glucose, Bld: 92 mg/dL (ref 70–99)
Potassium: 4.3 mmol/L (ref 3.5–5.1)
Sodium: 141 mmol/L (ref 135–145)
Total Bilirubin: 0.4 mg/dL (ref 0.0–1.2)
Total Protein: 6.3 g/dL — ABNORMAL LOW (ref 6.5–8.1)

## 2023-11-09 LAB — CBC WITH DIFFERENTIAL (CANCER CENTER ONLY)
Abs Immature Granulocytes: 0.01 10*3/uL (ref 0.00–0.07)
Basophils Absolute: 0 10*3/uL (ref 0.0–0.1)
Basophils Relative: 1 %
Eosinophils Absolute: 0.1 10*3/uL (ref 0.0–0.5)
Eosinophils Relative: 1 %
HCT: 37.3 % (ref 36.0–46.0)
Hemoglobin: 12.2 g/dL (ref 12.0–15.0)
Immature Granulocytes: 0 %
Immature Platelet Fraction: 2 % (ref 1.2–8.6)
Lymphocytes Relative: 38 %
Lymphs Abs: 1.7 10*3/uL (ref 0.7–4.0)
MCH: 34.1 pg — ABNORMAL HIGH (ref 26.0–34.0)
MCHC: 32.7 g/dL (ref 30.0–36.0)
MCV: 104.2 fL — ABNORMAL HIGH (ref 80.0–100.0)
Monocytes Absolute: 0.5 10*3/uL (ref 0.1–1.0)
Monocytes Relative: 12 %
Neutro Abs: 2.2 10*3/uL (ref 1.7–7.7)
Neutrophils Relative %: 48 %
Platelet Count: 136 10*3/uL — ABNORMAL LOW (ref 150–400)
RBC: 3.58 MIL/uL — ABNORMAL LOW (ref 3.87–5.11)
RDW: 14.2 % (ref 11.5–15.5)
WBC Count: 4.5 10*3/uL (ref 4.0–10.5)
nRBC: 0 % (ref 0.0–0.2)

## 2023-11-09 MED ORDER — SODIUM CHLORIDE 0.9% FLUSH
10.0000 mL | INTRAVENOUS | Status: DC | PRN
  Administered 2023-11-09: 10 mL

## 2023-11-09 MED ORDER — SODIUM CHLORIDE 0.9 % IV SOLN
Freq: Once | INTRAVENOUS | Status: DC
Start: 2023-11-09 — End: 2023-11-09

## 2023-11-09 MED ORDER — DIPHENHYDRAMINE HCL 25 MG PO CAPS
25.0000 mg | ORAL_CAPSULE | Freq: Once | ORAL | Status: AC
  Administered 2023-11-09: 25 mg via ORAL
  Filled 2023-11-09: qty 1

## 2023-11-09 MED ORDER — HEPARIN SOD (PORK) LOCK FLUSH 100 UNIT/ML IV SOLN
500.0000 [IU] | Freq: Once | INTRAVENOUS | Status: AC | PRN
  Administered 2023-11-09: 500 [IU]

## 2023-11-09 MED ORDER — DEXAMETHASONE SODIUM PHOSPHATE 10 MG/ML IJ SOLN
10.0000 mg | Freq: Once | INTRAMUSCULAR | Status: AC
  Administered 2023-11-09: 10 mg via INTRAVENOUS
  Filled 2023-11-09: qty 1

## 2023-11-09 MED ORDER — PALONOSETRON HCL INJECTION 0.25 MG/5ML
0.2500 mg | Freq: Once | INTRAVENOUS | Status: AC
  Administered 2023-11-09: 0.25 mg via INTRAVENOUS
  Filled 2023-11-09: qty 5

## 2023-11-09 MED ORDER — FAM-TRASTUZUMAB DERUXTECAN-NXKI CHEMO 100 MG IV SOLR
4.0500 mg/kg | Freq: Once | INTRAVENOUS | Status: AC
  Administered 2023-11-09: 200 mg via INTRAVENOUS
  Filled 2023-11-09: qty 10

## 2023-11-09 MED ORDER — DEXTROSE 5 % IV SOLN: Freq: Once | INTRAVENOUS | Status: AC

## 2023-11-09 MED ORDER — ACETAMINOPHEN 325 MG PO TABS
650.0000 mg | ORAL_TABLET | Freq: Once | ORAL | Status: AC
  Administered 2023-11-09: 650 mg via ORAL
  Filled 2023-11-09: qty 2

## 2023-11-09 MED ORDER — ZOLEDRONIC ACID 4 MG/5ML IV CONC
3.0000 mg | Freq: Once | INTRAVENOUS | Status: AC
  Administered 2023-11-09: 3 mg via INTRAVENOUS
  Filled 2023-11-09: qty 3.75

## 2023-11-09 NOTE — Patient Instructions (Signed)
 Fam-Trastuzumab Deruxtecan Injection What is this medication? FAM-TRASTUZUMAB DERUXTECAN (fam-tras TOOZ eu mab DER ux TEE kan) treats some types of cancer. It works by blocking a protein that causes cancer cells to grow and multiply. This helps to slow or stop the spread of cancer cells. This medicine may be used for other purposes; ask your health care provider or pharmacist if you have questions. COMMON BRAND NAME(S): ENHERTU What should I tell my care team before I take this medication? They need to know if you have any of these conditions: Heart disease Heart failure Infection, especially a viral infection, such as chickenpox, cold sores, or herpes Liver disease Lung or breathing disease, such as asthma or COPD An unusual or allergic reaction to fam-trastuzumab deruxtecan, other medications, foods, dyes, or preservatives Pregnant or trying to get pregnant Breast-feeding How should I use this medication? This medication is injected into a vein. It is given by your care team in a hospital or clinic setting. A special MedGuide will be given to you before each treatment. Be sure to read this information carefully each time. Talk to your care team about the use of this medication in children. Special care may be needed. Overdosage: If you think you have taken too much of this medicine contact a poison control center or emergency room at once. NOTE: This medicine is only for you. Do not share this medicine with others. What if I miss a dose? It is important not to miss your dose. Call your care team if you are unable to keep an appointment. What may interact with this medication? Interactions are not expected. This list may not describe all possible interactions. Give your health care provider a list of all the medicines, herbs, non-prescription drugs, or dietary supplements you use. Also tell them if you smoke, drink alcohol, or use illegal drugs. Some items may interact with your  medicine. What should I watch for while using this medication? Visit your care team for regular checks on your progress. Tell your care team if your symptoms do not start to get better or if they get worse. This medication may increase your risk of getting an infection. Call your care team for advice if you get a fever, chills, sore throat, or other symptoms of a cold or flu. Do not treat yourself. Try to avoid being around people who are sick. Avoid taking medications that contain aspirin, acetaminophen, ibuprofen, naproxen, or ketoprofen unless instructed by your care team. These medications may hide a fever. Be careful brushing or flossing your teeth or using a toothpick because you may get an infection or bleed more easily. If you have any dental work done, tell your dentist you are receiving this medication. This medication may cause dry eyes and blurred vision. If you wear contact lenses, you may feel some discomfort. Lubricating eye drops may help. See your care team if the problem does not go away or is severe. Talk to your care team if you may be pregnant. Serious birth defects can occur if you take this medication during pregnancy and for 7 months after the last dose. If your partner can get pregnant, use a condom during sex while taking this medication and for 4 months after the last dose. Do not breastfeed while taking this medication and for 7 months after the last dose. This medication may cause infertility. Talk to your care team if you are concerned about your fertility. What side effects may I notice from receiving this medication? Side effects  that you should report to your care team as soon as possible: Allergic reactions--skin rash, itching, hives, swelling of the face, lips, tongue, or throat Dry cough, shortness of breath or trouble breathing Infection--fever, chills, cough, sore throat, wounds that don't heal, pain or trouble when passing urine, general feeling of discomfort or  being unwell Heart failure--shortness of breath, swelling of the ankles, feet, or hands, sudden weight gain, unusual weakness or fatigue Unusual bruising or bleeding Side effects that usually do not require medical attention (report these to your care team if they continue or are bothersome): Constipation Diarrhea Hair loss Muscle pain Nausea Vomiting This list may not describe all possible side effects. Call your doctor for medical advice about side effects. You may report side effects to FDA at 1-800-FDA-1088. Where should I keep my medication? This medication is given in a hospital or clinic. It will not be stored at home. NOTE: This sheet is a summary. It may not cover all possible information. If you have questions about this medicine, talk to your doctor, pharmacist, or health care provider.  2024 Elsevier/Gold Standard (2022-12-24 00:00:00)

## 2023-11-10 ENCOUNTER — Other Ambulatory Visit: Payer: Self-pay

## 2023-11-11 ENCOUNTER — Other Ambulatory Visit: Payer: Self-pay

## 2023-11-20 ENCOUNTER — Other Ambulatory Visit: Payer: Self-pay

## 2023-11-29 ENCOUNTER — Encounter: Payer: Self-pay | Admitting: Oncology

## 2023-11-30 ENCOUNTER — Inpatient Hospital Stay

## 2023-11-30 ENCOUNTER — Encounter: Payer: Self-pay | Admitting: Hematology and Oncology

## 2023-11-30 ENCOUNTER — Inpatient Hospital Stay: Admitting: Hematology and Oncology

## 2023-11-30 VITALS — BP 161/69 | HR 61 | Temp 98.0°F | Resp 20 | Ht 60.25 in | Wt 108.7 lb

## 2023-11-30 DIAGNOSIS — C787 Secondary malignant neoplasm of liver and intrahepatic bile duct: Secondary | ICD-10-CM

## 2023-11-30 DIAGNOSIS — C50412 Malignant neoplasm of upper-outer quadrant of left female breast: Secondary | ICD-10-CM

## 2023-11-30 DIAGNOSIS — C7951 Secondary malignant neoplasm of bone: Secondary | ICD-10-CM

## 2023-11-30 DIAGNOSIS — Z171 Estrogen receptor negative status [ER-]: Secondary | ICD-10-CM

## 2023-11-30 DIAGNOSIS — Z5112 Encounter for antineoplastic immunotherapy: Secondary | ICD-10-CM | POA: Diagnosis not present

## 2023-11-30 LAB — CMP (CANCER CENTER ONLY)
ALT: 17 U/L (ref 0–44)
AST: 39 U/L (ref 15–41)
Albumin: 3.8 g/dL (ref 3.5–5.0)
Alkaline Phosphatase: 62 U/L (ref 38–126)
Anion gap: 9 (ref 5–15)
BUN: 18 mg/dL (ref 8–23)
CO2: 25 mmol/L (ref 22–32)
Calcium: 10.1 mg/dL (ref 8.9–10.3)
Chloride: 106 mmol/L (ref 98–111)
Creatinine: 0.82 mg/dL (ref 0.44–1.00)
GFR, Estimated: >60 mL/min (ref >60)
Glucose, Bld: 99 mg/dL (ref 70–99)
Potassium: 4.6 mmol/L (ref 3.5–5.1)
Sodium: 140 mmol/L (ref 135–145)
Total Bilirubin: 0.5 mg/dL (ref 0.0–1.2)
Total Protein: 6.7 g/dL (ref 6.5–8.1)

## 2023-11-30 LAB — CBC WITH DIFFERENTIAL (CANCER CENTER ONLY)
Abs Immature Granulocytes: 0.01 K/uL (ref 0.00–0.07)
Basophils Absolute: 0 K/uL (ref 0.0–0.1)
Basophils Relative: 1 %
Eosinophils Absolute: 0 K/uL (ref 0.0–0.5)
Eosinophils Relative: 1 %
HCT: 37.1 % (ref 36.0–46.0)
Hemoglobin: 12.5 g/dL (ref 12.0–15.0)
Immature Granulocytes: 0 %
Lymphocytes Relative: 35 %
Lymphs Abs: 1.5 K/uL (ref 0.7–4.0)
MCH: 35 pg — ABNORMAL HIGH (ref 26.0–34.0)
MCHC: 33.7 g/dL (ref 30.0–36.0)
MCV: 103.9 fL — ABNORMAL HIGH (ref 80.0–100.0)
Monocytes Absolute: 0.6 K/uL (ref 0.1–1.0)
Monocytes Relative: 13 %
Neutro Abs: 2.2 K/uL (ref 1.7–7.7)
Neutrophils Relative %: 50 %
Platelet Count: 161 K/uL (ref 150–400)
RBC: 3.57 MIL/uL — ABNORMAL LOW (ref 3.87–5.11)
RDW: 14.2 % (ref 11.5–15.5)
WBC Count: 4.3 K/uL (ref 4.0–10.5)
nRBC: 0 % (ref 0.0–0.2)

## 2023-11-30 MED ORDER — HEPARIN SOD (PORK) LOCK FLUSH 100 UNIT/ML IV SOLN
500.0000 [IU] | Freq: Once | INTRAVENOUS | Status: AC | PRN
  Administered 2023-11-30: 500 [IU]

## 2023-11-30 MED ORDER — PALONOSETRON HCL INJECTION 0.25 MG/5ML
0.2500 mg | Freq: Once | INTRAVENOUS | Status: AC
  Administered 2023-11-30: 0.25 mg via INTRAVENOUS
  Filled 2023-11-30: qty 5

## 2023-11-30 MED ORDER — ACETAMINOPHEN 325 MG PO TABS
650.0000 mg | ORAL_TABLET | Freq: Once | ORAL | Status: AC
  Administered 2023-11-30: 650 mg via ORAL
  Filled 2023-11-30: qty 2

## 2023-11-30 MED ORDER — DEXTROSE 5 % IV SOLN: Freq: Once | INTRAVENOUS | Status: AC

## 2023-11-30 MED ORDER — FAM-TRASTUZUMAB DERUXTECAN-NXKI CHEMO 100 MG IV SOLR
4.0500 mg/kg | Freq: Once | INTRAVENOUS | Status: AC
  Administered 2023-11-30: 200 mg via INTRAVENOUS
  Filled 2023-11-30: qty 10

## 2023-11-30 MED ORDER — SODIUM CHLORIDE 0.9% FLUSH
10.0000 mL | INTRAVENOUS | Status: DC | PRN
  Administered 2023-11-30: 10 mL

## 2023-11-30 MED ORDER — DEXAMETHASONE SODIUM PHOSPHATE 10 MG/ML IJ SOLN
10.0000 mg | Freq: Once | INTRAMUSCULAR | Status: AC
  Administered 2023-11-30: 10 mg via INTRAVENOUS
  Filled 2023-11-30: qty 1

## 2023-11-30 MED ORDER — DIPHENHYDRAMINE HCL 25 MG PO CAPS
25.0000 mg | ORAL_CAPSULE | Freq: Once | ORAL | Status: AC
  Administered 2023-11-30: 25 mg via ORAL
  Filled 2023-11-30: qty 1

## 2023-11-30 NOTE — Assessment & Plan Note (Signed)
 History of stage IIB (T2c N1 M0) HER2 receptor positive, ER/PR negative, left breast cancer diagnosed in June 2020.  She was treated with neoadjuvant Kadcyla /Perjeta  followed by left mastectomy.  She had a complete response in breast and nodes. She had only received two doses of adjuvant trastuzumab  before discontinuing in December 2020. She developed recurrent disease in 2022

## 2023-11-30 NOTE — Progress Notes (Signed)
 [facility]  [address] [place],  [place]  [address] [phone]  Clinic Day:  11/30/2023  Referring physician: [name], MD  ASSESSMENT & PLAN:   Assessment & Plan: Malignant neoplasm of upper-outer quadrant of left breast in female, estrogen receptor negative ([name]) History of stage IIB (T2c N1 M0) HER2 receptor positive, ER/PR negative, left breast cancer diagnosed in June 2020.  She was treated with neoadjuvant Kadcyla /Perjeta  followed by left mastectomy.  She had a complete response in breast and nodes. She had only received two doses of adjuvant trastuzumab  before discontinuing in December 2020. She developed recurrent disease in 2022.  Malignant neoplasm metastatic to liver [facility]) Numerous heterogeneously enhancing, internally necrotic lesions in the right hepatic lobe CT in December 2023, which was consistent with metastatic disease. Largest lesion measures up to 6.7 cm and there are at least 6 lesions.  Liver biopsy was consistent with metastatic breast cancer with negative estrogen and progesterone receptors and positive HER2 receptor. She was initially treated with trastuzumab  with an excellent response with significant decrease in the size of her liver lesions and sustained response. She later had progression of this disease in the chest wall and was switched to Enhertu .  CT chest, abdomen, and pelvis in March revealed stable subtle liver lesions with at least 1 lesion is no longer visualized.   CT chest, abdomen and pelvis in October 2024 revealed a decrease in the hepatic lesions with no new lesions seen.  CT imaging in April was fairly stable except a slight increase in the left adrenal nodule. There was a stable vague liver lesion and stable bone metatasis. She continues Enhertu  every 3 weeks without significant difficulty.  Her main complaint is excessive tearing from the treatment.  She has chronic pain due to polyneuropathy in the right hand.  She will  proceed with a 33rd cycle of Enhertu  today.  I will plan to see her back in 3 weeks with a CBC, comprehensive metabolic panel and echocardiogram prior to a 34th cycle of Enhertu .  Malignant neoplasm metastatic to bone [facility]) Bone metastases diagnosed in December 2022, for which she was placed on zoledronic  acid every 4 weeks. She is now receiving zoledronic  acid every 6 weeks to correspond with her chemotherapy. CT imaging in April 2025 did not reveal any evidence of progressive disease.    The patient understands the plans discussed today and is in agreement with them.  She knows to contact our office if she develops concerns prior to her next appointment.   I provided 20 minutes of face-to-face time during this encounter and > 50% was spent counseling as documented under my assessment and plan.    Marwa Fuhrman A Danni Shima, PA-C  [facility] [place] - A DEPT OF [facility] [address] [place] [address] Dept: [phone] Dept Fax: [phone]   [name].     CHIEF COMPLAINT:  CC: Recurrent HER2 receptor positive breast cancer  Current Treatment: Enhertu  every 3 weeks  HISTORY OF PRESENT ILLNESS:  Jasmin Lewis is a 89 year old woman with recurrent HER2 receptor positive. She was diagnosed with stage IIB (T2 N1 M0) HER2 receptor positive and hormone receptor negative left breast cancer in June 2020 when she was able to palpate a mass of the left breast. Mammogram and ultrasound in May 2020 revealed a 2.9 cm irregular mass in the upper outer left breast  as well as two adjacent abnormal left axillary lymph nodes. Biopsy was obtained on June 3rd and revealed invasive ductal carcinoma and DCIS with lymphovascular invasion. Left axillary lymph node was positive for invasive ductal carcinoma. HER2 was positive 3+. Estrogen and progesterone receptors were both negative at 0%, and Ki67 was 70%.  Breast MRI from July revealed a 3.3 cm mass of the lateral portion of the left breast as well as significant non mass enhancement surrounding this mass and extending anteriorly into the nipple base, with largest diameter in the anterior to posterior axis, measuring 7.2 centimeters. Three enlarged left axillary lymph nodes. Right breast was negative.  PET imaging in July revealed no evidence of additional metastatic involvement. She received neoadjuvant trastuzumab /pertuzumab  for 6 cycles. Repeat breast MRI in October revealed complete resolution of the enhancing mass and associated non mass enhancement within the left breast. Previously identified left axillary lymphadenopathy was no longer identified. She underwent left mastectomy and targeted lymph node dissection in November 2020.  Pathology revealed evidence of malignancy within the breast and three sentinel lymph nodes were negative for malignancy (0/3). She continued maintenance trastuzumab  injections for two more doses, but this was discontinued in December 2020 as she refused further ECHO scans.    Of note, she also has a history of stage II colon cancer, diagnosed in May 2007. Preop CEA was 5.2. She underwent surgical resection and surgical pathology revealed a 5.9 x 3.8 x 1.2 cm moderate to well-differentiated adenocarcinoma penetrating the muscular wall. Forty lymph nodes negative. No vascular or lymphatic invasion. Multiple diverticula with microabscess formation and inflammation. Carcinoma present in at least 1 diverticulum. She received adjuvant chemotherapy with capecitabine for 9 months. Colonoscopy from August 2013 revealed 2 polyps which were resected and chronic diverticulosis.  In December 2022, she was evaluated at [facility] ED for abdominal and back pain.  CT abdomen pelvis revealed new lytic lesion involving the L2 vertebral body concerning for metastatic disease, as well as multiple hepatic rim-enhancing fluid collections with  significant surrounding edema and an additional subtle 1.5 cm hypodensity in the hepatic dome. Given clinical history and presence of a new L2 lesion, leading differential consideration is metastatic disease. MRI abdomen in revealed numerous heterogeneously enhancing, internally necrotic lesions in the right hepatic lobe, highly concerning for metastatic disease. Largest lesion measures up to 6.7 cm in hepatic segment VI. Enhancing osseous lesions in the L2 vertebral body, both iliac bones, and in the sacrum, highly concerning for osseous metastases was also seen.    PET scan revealed widespread hypermetabolic disease in the left chest wall, left axillary, subpectoral and supraclavicular nodes, liver, left adrenal and bone.  MRI thoracic/lumbar spine revealed widespread osseous metastasis with the largest deposit replacing L2 vertebral body where there was a compression fracture with mild height loss and early epidural tumor extension affecting L2-3 foramina.  MRI brain was negative for metastatic disease.  Biopsy of of the left chest wall  was consistent with recurrent ductal carcinoma of the breast.  HER2 was positive, estrogen and progesterone receptors were negative.  She was initially treated with trastuzumab , as well as zoledronic  acid, beginning in January 2023 with an initial response.   Oncology History Overview Note  Cancer Staging Malignant neoplasm of upper-outer quadrant of left breast in female, estrogen receptor negative ([name]) Staging form: Breast, AJCC 8th Edition - Clinical stage from 10/11/2018: Stage IIB (cT2, cN1, cM0, G3, ER-, PR-, HER2+) - Signed by Lanny Callander, MD on 11/02/2018 - Clinical: No stage  assigned - Unsigned    History of colon cancer, stage II  09/2005 Initial Diagnosis   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 Findings were a 5.9 x 3.8 x 1.2 cm moderate to  well-differentiated adenocarcinoma penetrating the muscular wall. Forty lymph nodes negative. No vascular or lymphatic invasion. Multiple diverticula with microabscess formation and inflammation. Carcinoma present in at least 1 diverticulum. Preop CEA 5.2 with lab normal range 0 to 2.5. Preop CT scan with no obvious additional pathology.    2007 -  Chemotherapy   She received oral Xeloda chemotherapy as an adjuvant. She declined treatment on a clinical trial.    11/12/2011 Initial Diagnosis   [name], stage II   12/09/2011 Procedure   Followup colonoscopy done on 12/09/2011. She was found to have 2 polyps which were removed. Chronic diverticulosis.     Malignant neoplasm of upper-outer quadrant of left breast in female, estrogen receptor negative ([name])  09/26/2018 Mammogram   Mammogram/US  of left breast 09/26/18 IMPRESSION:  1. 2.9cm irregular mass in the upper outer left breast corresponds to the palpable abnormality. This is highly suspicious for breast carcinoma.  2. Two adjacent abnormal left axillary  Lymph nodes suspicious for metastatic adenopathy. There is a third borderline abnormal left axillary LN with a cortex thickened to 4mm.  3. Benign right breast cyst. No evidence of right breast malignancy.    10/11/2018 Cancer Staging   Staging form: Breast, AJCC 8th Edition - Clinical stage from 10/11/2018: Stage IIB (cT2, cN1, cM0, G3, ER-, PR-, HER2+) - Signed by Lanny Callander, MD on 11/02/2018   10/11/2018 Initial Biopsy   Diagnosis 10/11/18 1. Breast, left, needle core biopsy, upper outer left 2 o'clock - INVASIVE DUCTAL CARCINOMA. - DUCTAL CARCINOMA IN SITU. - LYMPHOVASCULAR INVASION IS IDENTIFIED. - SEE COMMENT. 2. Lymph node, needle/core biopsy, left axilla - INVASIVE DUCTAL CARCINOMA. - SEE COMMENT.   10/11/2018 Receptors her2   The tumor cells are POSITIVE for Her2 (3+). Estrogen Receptor: 0%, NEGATIVE Progesterone Receptor: 0%, NEGATIVE Proliferation Marker Ki67: 70%    11/02/2018 Initial Diagnosis   Malignant neoplasm of upper-outer quadrant of left breast in female, estrogen receptor negative ([name])   11/15/2018 Breast MRI   MRI breast 11/15/18  IMPRESSION: 1. 3.3 centimeter mass in the LATERAL portion of the LEFT breast consistent with known malignancy. 2. There is significant non mass enhancement surrounding this mass and extending anteriorly into the nipple base, with largest diameter in the anterior to posterior axis, measuring 7.2 centimeters. 3. If the patient would consider breast conservation, additional MR guided core biopsies are recommended. Consider biopsy of the inferior and anterior extent of the non mass enhancement to document extent of disease. 4. Three enlarged LEFT axillary lymph nodes. 5. RIGHT breast is negative.   11/15/2018 PET scan   PET 11/15/18 IMPRESSION: Hypermetabolic left breast lesion compatible with known primary. Hypermetabolic left axillary lymph nodes are consistent with metastatic disease.   No evidence for additional hypermetabolic metastatic involvement in the neck, chest, abdomen, or pelvis.   11/17/2018 - 03/01/2019 Chemotherapy   Neo-adjuvant Kadcyla  and perjeta  q3weeks for 6 cycles starting 11/17/18. Stopped before surgery    11/29/2018 Pathology Results   Diagnosis 1. Breast, left, needle core biopsy, inferior anterior (cylinder clip) - INVASIVE DUCTAL CARCINOMA. - DUCTAL CARCINOMA IN SITU. - LYMPHOVASCULAR INVASION IS IDENTIFIED. - SEE COMMENT. 2. Breast, left, needle core biopsy, central posterior (  barbell clip) - INVASIVE DUCTAL CARCINOMA. - LYMPHOVASCULAR INVASION IS IDENTIFIED.   02/12/2019 Breast MRI   IMPRESSION: 1. Complete resolution of previously identified enhancing mass and associated non mass enhancement within the left breast. This is consistent with excellent response to chemotherapy. No residual or suspicious findings are identified. 2. No MRI evidence of malignancy on the right. 3.  Previously identified left axillary lymphadenopathy not definitively seen on today's study. However, this may be due to decreased field-of-view compared to prior study.     03/14/2019 Cancer Staging   Staging form: Breast, AJCC 8th Edition - Pathologic stage from 03/14/2019: pT0, pN0, cM0, GX, ER: Unknown, PR: Unknown, HER2: Not Assessed - Signed by Lanny Callander, MD on 03/28/2019   03/14/2019 Surgery   LEFT MASTECTOMY WITH TARGETED LYMPH NODE DISSECTION and Left Axillary Sentinel Lymph  Node Biopsy by Dr Curvin 03/14/19    03/14/2019 Pathology Results   FINAL MICROSCOPIC DIAGNOSIS:   A. LYMPH NODE, LEFT, SENTINEL, BIOPSY:  - There is no evidence of carcinoma in 1 of 1 lymph node (0/1).   B. BREAST, LEFT, MASTECTOMY:  - Benign breast parenchyma with treatment-related changes.  - There is no evidence of malignancy.  - See oncology table below.   C. LYMPH NODE, LEFT #1, SENTINEL, BIOPSY:  - There is no evidence of carcinoma in 1 of 1 lymph node (0/1).   D. LYMPH NODE, LEFT #2, SENTINEL, BIOPSY:  - There is no evidence of carcinoma in 1 of 1 lymph node (0/1).     04/04/2019 - 04/25/2019 Chemotherapy   Maintenance Herceptin  injections every 3 weeks starting 04/03/19 to complete 1 year of treatment that was started in 11/2018. Stopped after 2nd dose as she will not be repeating Echos.    06/03/2021 - 06/24/2021 Chemotherapy   Patient is on Treatment Plan : BREAST Trastuzumab  q21d X 11 Cycles     06/25/2021 - 01/01/2022 Chemotherapy   Patient is on Treatment Plan : BREAST Docetaxel  + Trastuzumab  + Pertuzumab  (THP) q21d     06/25/2021 - 01/02/2022 Chemotherapy   Patient is on Treatment Plan : BREAST Docetaxel  + Trastuzumab  + Pertuzumab  (THP) q21d     09/14/2021 Imaging   CT chest/abdomen/pelvis  IMPRESSION:  1. Interval resolution of previously seen bulky left axillary and  subpectoral lymphadenopathy. No persistently enlarged lymph nodes.  2. Multiple liver lesions are significantly diminished  in size.  3. Widespread osseous metastatic disease, with an interval increase  in sclerosis of several previously lytic lesions.  4. Constellation of findings is consistent with treatment response  of metastatic disease  5. New, although age indeterminate pathologic wedge deformity of the  T6 vertebral body as well as an increased pathologic wedge deformity  of the L2 vertebral body.  6. Coronary artery disease.  Aortic Atherosclerosis (ICD10-I70.0).    01/26/2022 -  Chemotherapy   Patient is on Treatment Plan : BREAST METASTATIC Fam-Trastuzumab Deruxtecan-nxki  (Enhertu ) (5.4) q21d     Malignant neoplasm metastatic to liver ([name])  04/17/2021 Imaging   CT ABDOMEN AND PELVIS WITH CONTRAST: -New lytic lesion involving the L2 vertebral body (6:72, 2:26) with minimal cortical breakthrough superiorly, but with preservation of vertebral body height, concerning for metastatic disease.  -There are several indeterminate hepatic lesions as described above. In addition to the lesions described above, there is an additional subtle 1.5 cm hypodensity in the hepatic dome (2:7). Given clinical history and presence of a new L2 lesion, leading differential consideration is metastatic disease. Recommend further evaluation with MRI  of the abdomen with and without contrast.   05/12/2021 Imaging   MRI ABDOMEN WITH AND WITHOUT CONTRAST: Numerous heterogeneously enhancing, internally necrotic lesions in the right hepatic lobe, highly concerning for metastatic disease. Largest lesion measures up to 6.7 cm in hepatic segment VI.   Enhancing osseous lesions in the L2 vertebral body, both iliac bones, and in the sacrum, highly concerning for osseous metastases.   05/15/2021 Initial Diagnosis   Liver metastases ([name])   05/27/2021 PET scan   1. Widespread recurrent/metastatic disease in this patient who is  status post bilateral mastectomy. Left chest wall recurrence with  left axillary/subpectoral, supraclavicular nodal  metastasis.  2. Hepatic, left adrenal, and widespread osseous metastasis.  3. Incidental findings, including: Right nephrolithiasis. Tiny  hiatal hernia.    05/27/2021 Imaging   MRI LUMBAR AND THORACIC SPINE: Thoracic spine:  Osseous metastatic disease involving each level. The most dramatic deposits are at T4 and T6 where there is extraosseous/epidural tumor. No cord compression but there is foraminal impingement on the left at T6-7 and on the right at T3-4. Early dorsal epidural tumor at the level of T10 and T3.   Lumbar spine:  1. Widespread osseous metastatic disease with largest deposit  replacing the L2 body where there is a compression fracture with  mild height loss. Also at this level is early epidural tumor  extension likely affecting both L2-3 foramina.  2. 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    05/27/2021 Imaging   MRI HEAD WITH AND WITHOUT CONTRAST: Negative for metastatic disease to the brain or calvarium.   06/03/2021 - 06/24/2021 Chemotherapy   Patient is on Treatment Plan : BREAST Trastuzumab  q21d X 11 Cycles     06/25/2021 - 01/01/2022 Chemotherapy   Patient is on Treatment Plan : BREAST Docetaxel  + Trastuzumab  + Pertuzumab  (THP) q21d     06/25/2021 - 01/02/2022 Chemotherapy   Patient is on Treatment Plan : BREAST Docetaxel  + Trastuzumab  + Pertuzumab  (THP) q21d     09/14/2021 Imaging   CT chest/abdomen/pelvis  IMPRESSION:  1. Interval resolution of previously seen bulky left axillary and  subpectoral lymphadenopathy. No persistently enlarged lymph nodes.  2. Multiple liver lesions are significantly diminished in size.  3. Widespread osseous metastatic disease, with an interval increase  in sclerosis of several previously lytic lesions.  4. Constellation of findings is consistent with treatment response  of metastatic disease  5. New, although age indeterminate pathologic wedge deformity of the  T6 vertebral body as well as an  increased pathologic wedge deformity  of the L2 vertebral body.  6. Coronary artery disease.  Aortic Atherosclerosis (ICD10-I70.0).    01/26/2022 -  Chemotherapy   Patient is on Treatment Plan : BREAST METASTATIC Fam-Trastuzumab Deruxtecan-nxki  (Enhertu ) (5.4) q21d     Malignant neoplasm metastatic to bone ([name])  04/17/2021 Imaging   CT ABDOMEN AND PELVIS WITH CONTRAST: -New lytic lesion involving the L2 vertebral body (6:72, 2:26) with minimal cortical breakthrough superiorly, but with preservation of vertebral body height, concerning for metastatic disease.  -There are several indeterminate hepatic lesions as described above. In addition to the lesions described above, there is an additional subtle 1.5 cm hypodensity in the hepatic dome (2:7). Given clinical history and presence of a new L2 lesion, leading differential consideration is metastatic disease. Recommend further evaluation with MRI of the abdomen with and without contrast.   05/12/2021 Imaging   MRI ABDOMEN WITH AND WITHOUT CONTRAST: Numerous heterogeneously enhancing, internally necrotic lesions in the  right hepatic lobe, highly concerning for metastatic disease. Largest lesion measures up to 6.7 cm in hepatic segment VI.   Enhancing osseous lesions in the L2 vertebral body, both iliac bones, and in the sacrum, highly concerning for osseous metastases.   05/15/2021 Initial Diagnosis   Bone metastases ([name])   05/27/2021 PET scan   1. Widespread recurrent/metastatic disease in this patient who is  status post bilateral mastectomy. Left chest wall recurrence with  left axillary/subpectoral, supraclavicular nodal metastasis.  2. Hepatic, left adrenal, and widespread osseous metastasis.  3. Incidental findings, including: Right nephrolithiasis. Tiny  hiatal hernia.    05/27/2021 Imaging   MRI LUMBAR AND THORACIC SPINE: Thoracic spine:  Osseous metastatic disease involving each level. The most dramatic deposits are at T4 and T6  where there is extraosseous/epidural tumor. No cord compression but there is foraminal impingement on the left at T6-7 and on the right at T3-4. Early dorsal epidural tumor at the level of T10 and T3.   Lumbar spine:  1. Widespread osseous metastatic disease with largest deposit  replacing the L2 body where there is a compression fracture with  mild height loss. Also at this level is early epidural tumor  extension likely affecting both L2-3 foramina.  2. 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    05/27/2021 Imaging   MRI HEAD WITH AND WITHOUT CONTRAST: Negative for metastatic disease to the brain or calvarium.   06/03/2021 - 06/24/2021 Chemotherapy   Patient is on Treatment Plan : BREAST Trastuzumab  q21d X 11 Cycles     06/25/2021 - 01/01/2022 Chemotherapy   Patient is on Treatment Plan : BREAST Docetaxel  + Trastuzumab  + Pertuzumab  (THP) q21d     06/25/2021 - 01/02/2022 Chemotherapy   Patient is on Treatment Plan : BREAST Docetaxel  + Trastuzumab  + Pertuzumab  (THP) q21d     09/14/2021 Imaging   CT chest/abdomen/pelvis  IMPRESSION:  1. Interval resolution of previously seen bulky left axillary and  subpectoral lymphadenopathy. No persistently enlarged lymph nodes.  2. Multiple liver lesions are significantly diminished in size.  3. Widespread osseous metastatic disease, with an interval increase  in sclerosis of several previously lytic lesions.  4. Constellation of findings is consistent with treatment response  of metastatic disease  5. New, although age indeterminate pathologic wedge deformity of the  T6 vertebral body as well as an increased pathologic wedge deformity  of the L2 vertebral body.  6. Coronary artery disease.  Aortic Atherosclerosis (ICD10-I70.0).    01/26/2022 -  Chemotherapy   Patient is on Treatment Plan : BREAST METASTATIC Fam-Trastuzumab Deruxtecan-nxki  (Enhertu ) (5.4) q21d         INTERVAL HISTORY:   Jasmin Lewis is here today for  repeat clinical assessment. She reports continued excessive tearing but otherwise tolerates Enhertu  without significant difficulty. She tried Pataday eye drops which did not help the tearing.  She denies fevers or chills. She reports stable neuropathic pain of the left hand. Her activity is limited due to this. Her appetite is fairly good. Her weight has decreased 2 pounds over last 3 weeks.     REVIEW OF SYSTEMS:   Review of Systems  Constitutional:  Negative for appetite change, chills, fatigue, fever and unexpected weight change.  HENT:   Negative for lump/mass, mouth sores and sore throat.   Respiratory:  Negative for cough and shortness of breath.   Cardiovascular:  Negative for chest pain and leg swelling.  Gastrointestinal:  Negative for abdominal pain, constipation, diarrhea, nausea and vomiting.  Endocrine: Negative for hot flashes.  Genitourinary:  Negative for difficulty urinating, dysuria, frequency and hematuria.   Musculoskeletal:  Negative for arthralgias, back pain and myalgias.  Skin:  Negative for rash.  Neurological:  Negative for dizziness and headaches.  Hematological:  Negative for adenopathy. Does not bruise/bleed easily.  Psychiatric/Behavioral:  Negative for depression and sleep disturbance. The patient is not nervous/anxious.      VITALS:   Blood pressure (!) 161/69, pulse 61, temperature 98 F (36.7 C), temperature source Oral, resp. rate 20, height 5' 0.25 (1.53 m), weight 108 lb 11.2 oz (49.3 kg), SpO2 96%.  Wt Readings from Last 3 Encounters:  11/30/23 108 lb 11.2 oz (49.3 kg)  11/09/23 110 lb (49.9 kg)  10/19/23 109 lb (49.4 kg)    Body mass index is 21.05 kg/m.  Performance status (ECOG): 2 - Symptomatic, <50% confined to bed    PHYSICAL EXAM:   Physical Exam Vitals and nursing note reviewed.  Constitutional:      General: She is not in acute distress.    Appearance: Normal appearance.  HENT:     Head: Normocephalic and atraumatic.      Mouth/Throat:     Mouth: Mucous membranes are moist.     Pharynx: Oropharynx is clear. No oropharyngeal exudate or posterior oropharyngeal erythema.  Eyes:     General: No scleral icterus.    Extraocular Movements: Extraocular movements intact.     Conjunctiva/sclera: Conjunctivae normal.     Pupils: Pupils are equal, round, and reactive to light.  Cardiovascular:     Rate and Rhythm: Normal rate and regular rhythm.     Heart sounds: Normal heart sounds. No murmur heard.    No friction rub. No gallop.  Pulmonary:     Effort: Pulmonary effort is normal.     Breath sounds: Normal breath sounds. No wheezing, rhonchi or rales.  Chest:  Breasts:    Right: Normal.     Left: Normal.     Comments: Left chest wall is negative. Abdominal:     General: There is no distension.     Palpations: Abdomen is soft. There is no mass.     Tenderness: There is no abdominal tenderness.  Musculoskeletal:     Right hand: Normal.     Left hand: Deformity present. Decreased range of motion. Decreased strength.     Cervical back: Normal range of motion and neck supple. No tenderness.     Right lower leg: No edema.     Left lower leg: No edema.     Comments: Wasting of the left hand  Lymphadenopathy:     Cervical: No cervical adenopathy.     Upper Body:     Right upper body: No supraclavicular or axillary adenopathy.     Left upper body: No supraclavicular or axillary adenopathy.  Skin:    General: Skin is warm and dry.     Coloration: Skin is not jaundiced.     Findings: No rash.  Neurological:     Mental Status: She is alert and oriented to person, place, and time.     Cranial Nerves: No cranial nerve deficit.  Psychiatric:        Mood and Affect: Mood normal.        Behavior: Behavior normal.        Thought Content: Thought content normal.     LABS:      Latest Ref Rng & Units 11/30/2023    1:08 PM 11/09/2023  1:04 PM 10/19/2023    1:30 PM  CBC  WBC 4.0 - 10.5 K/uL 4.3  4.5  4.3    Hemoglobin 12.0 - 15.0 g/dL 87.4  87.7  87.2   Hematocrit 36.0 - 46.0 % 37.1  37.3  38.2   Platelets 150 - 400 K/uL 161  136  152       Latest Ref Rng & Units 11/30/2023    1:08 PM 11/09/2023    1:04 PM 10/19/2023    1:30 PM  CMP  Glucose 70 - 99 mg/dL 99  92  90   BUN 8 - 23 mg/dL 18  17  20    Creatinine 0.44 - 1.00 mg/dL 9.17  9.22  9.05   Sodium 135 - 145 mmol/L 140  141  141   Potassium 3.5 - 5.1 mmol/L 4.6  4.3  4.4   Chloride 98 - 111 mmol/L 106  107  107   CO2 22 - 32 mmol/L 25  25  25    Calcium 8.9 - 10.3 mg/dL 89.8  9.8  89.6   Total Protein 6.5 - 8.1 g/dL 6.7  6.3  6.6   Total Bilirubin 0.0 - 1.2 mg/dL 0.5  0.4  0.4   Alkaline Phos 38 - 126 U/L 62  57  68   AST 15 - 41 U/L 39  38  41   ALT 0 - 44 U/L 17  18  19       Lab Results  Component Value Date   CEA1 1.8 06/03/2021   CEA 0.5 11/13/2012   /  CEA  Date Value Ref Range Status  06/03/2021 1.8 0.0 - 4.7 ng/mL Final    Comment:    (NOTE)                             Nonsmokers          <3.9                             Smokers             <5.6 [employer] Electrochemiluminescence Immunoassay (ECLIA) Values obtained with different assay methods or kits cannot be used interchangeably.  Results cannot be interpreted as absolute evidence of the presence or absence of malignant disease. Performed At: [facility] [address] [place], [place] [id number] Jennette Shorter MD Ey:[id number]   11/13/2012 0.5 0.0 - 5.0 ng/mL Final    Lab Results  Component Value Date   LDH 186 11/12/2011   LDH 187 09/08/2010   LDH 171 09/09/2009    STUDIES:   ECHOCARDIOGRAM COMPLETE Result Date: 11/05/2023    ECHOCARDIOGRAM REPORT   Patient Name:   KENNETH LAX Date of Exam: 11/03/2023 Medical Rec #:  [id number]         Height:       60.2 in Accession #:    [id number]        Weight:       109.0 lb Date of Birth:  [birth year]         BSA:          1.446 m Patient Age:    89 years          BP:           156/85  mmHg Patient Gender: F  HR:           67 bpm. Exam Location:  [place] Procedure: 2D Echo, Color Doppler, Limited Color Doppler and Strain Analysis            (Both Spectral and Color Flow Doppler were utilized during            procedure). Indications:    Malignant neoplasm metastatic to liver ([name]) [C78.7                 (ICD-10-CM)]; Malignant neoplasm metastatic to bone ([name])                 [C79.51 (ICD-10-CM)]; Chest wall recurrence of breast cancer,                 left ([name]) [C50.912, C79.89 (ICD-10-CM)]; Adverse effect of                 chemotherapy, subsequent encounter [T45.1X5D (ICD-10-CM)]  History:        Patient has prior history of Echocardiogram examinations, most                 recent 05/24/2023. Risk Factors:Hypertension.;                 Medications:Chemotherapy.  Sonographer:    Saddie Chimes Referring Phys: [id number] Kimiye Strathman A Abrahim Sargent IMPRESSIONS  1. Left ventricular ejection fraction, by estimation, is 55 to 60%. The left ventricle has normal function. The left ventricle has no regional wall motion abnormalities. There is mild left ventricular hypertrophy. Left ventricular diastolic parameters are indeterminate. The average left ventricular global longitudinal strain is -19.7 %. The global longitudinal strain is normal.  2. Right ventricular systolic function is normal. The right ventricular size is normal. There is mildly elevated pulmonary artery systolic pressure. The estimated right ventricular systolic pressure is 37.1 mmHg.  3. The mitral valve is degenerative. Mild mitral valve regurgitation. No evidence of mitral stenosis.  4. The aortic valve is tricuspid. There is moderate calcification of the aortic valve. Aortic valve regurgitation is mild. No aortic stenosis is present.  5. The inferior vena cava is normal in size with greater than 50% respiratory variability, suggesting right atrial pressure of 3 mmHg. Comparison(s): No signficant change in comparison to prior TTE from  05/24/2023. FINDINGS  Left Ventricle: Left ventricular ejection fraction, by estimation, is 55 to 60%. The left ventricle has normal function. The left ventricle has no regional wall motion abnormalities. The average left ventricular global longitudinal strain is -19.7 %. Strain was performed and the global longitudinal strain is normal. The left ventricular internal cavity size was normal in size. There is mild left ventricular hypertrophy. Left ventricular diastolic parameters are indeterminate. Right Ventricle: The right ventricular size is normal. Right vetricular wall thickness was not well visualized. Right ventricular systolic function is normal. There is mildly elevated pulmonary artery systolic pressure. The tricuspid regurgitant velocity  is 2.92 m/s, and with an assumed right atrial pressure of 3 mmHg, the estimated right ventricular systolic pressure is 37.1 mmHg. Left Atrium: Left atrial size was normal in size. Right Atrium: Right atrial size was normal in size. Pericardium: There is no evidence of pericardial effusion. Mitral Valve: The mitral valve is degenerative in appearance. There is mild thickening of the mitral valve leaflet(s). There is mild calcification of the mitral valve leaflet(s). Mild mitral valve regurgitation. No evidence of mitral valve stenosis. MV peak gradient, 4.2 mmHg. The mean mitral valve gradient is 1.0 mmHg. Tricuspid Valve: The  tricuspid valve is grossly normal. Tricuspid valve regurgitation is mild . No evidence of tricuspid stenosis. Aortic Valve: The aortic valve is tricuspid. There is moderate calcification of the aortic valve. Aortic valve regurgitation is mild. Aortic regurgitation PHT measures 632 msec. No aortic stenosis is present. Aortic valve mean gradient measures 3.9 mmHg.  Aortic valve peak gradient measures 8.1 mmHg. Aortic valve area, by VTI measures 1.60 cm. Pulmonic Valve: The pulmonic valve was normal in structure. Pulmonic valve regurgitation is mild. No  evidence of pulmonic stenosis. Aorta: The aortic root and ascending aorta are structurally normal, with no evidence of dilitation. Venous: The inferior vena cava is normal in size with greater than 50% respiratory variability, suggesting right atrial pressure of 3 mmHg. IAS/Shunts: The interatrial septum was not well visualized.  LEFT VENTRICLE PLAX 2D LVIDd:         4.10 cm   Diastology LVIDs:         2.20 cm   LV e' medial:    6.86 cm/s LV PW:         1.00 cm   LV E/e' medial:  10.4 LV IVS:        0.70 cm   LV e' lateral:   7.89 cm/s LVOT diam:     1.70 cm   LV E/e' lateral: 9.0 LV SV:         43 LV SV Index:   30        2D Longitudinal Strain LVOT Area:     2.27 cm  2D Strain GLS Avg:     -19.7 %  RIGHT VENTRICLE RV Basal diam:  3.20 cm RV Mid diam:    2.60 cm TAPSE (M-mode): 2.5 cm LEFT ATRIUM           Index LA diam:      2.70 cm 1.87 cm/m LA Vol (A4C): 7.6 ml  5.24 ml/m  AORTIC VALVE                    PULMONIC VALVE AV Area (Vmax):    1.62 cm     PV Vmax:       1.24 m/s AV Area (Vmean):   1.63 cm     PV Vmean:      81.300 cm/s AV Area (VTI):     1.60 cm     PV VTI:        0.228 m AV Vmax:           142.72 cm/s  PV Peak grad:  6.2 mmHg AV Vmean:          91.678 cm/s  PV Mean grad:  3.0 mmHg AV VTI:            0.267 m AV Peak Grad:      8.1 mmHg AV Mean Grad:      3.9 mmHg LVOT Vmax:         102.00 cm/s LVOT Vmean:        66.000 cm/s LVOT VTI:          0.188 m LVOT/AV VTI ratio: 0.70 AI PHT:            632 msec AR Vena Contracta: 0.30 cm  AORTA Ao Asc diam: 3.10 cm MITRAL VALVE                TRICUSPID VALVE MV Area (PHT): 4.36 cm     TR Peak grad:   34.1 mmHg MV Area VTI:  0.90 cm     TR Vmax:        292.00 cm/s MV Peak grad:  4.2 mmHg MV Mean grad:  1.0 mmHg     SHUNTS MV Vmax:       1.02 m/s     Systemic VTI:  0.19 m MV Vmean:      55.4 cm/s    Systemic Diam: 1.70 cm MV Decel Time: 174 msec MV E velocity: 71.30 cm/s MV A velocity: 100.00 cm/s MV E/A ratio:  0.71 Sreedhar reddy Madireddy  Electronically signed by Alean reddy Madireddy Signature Date/Time: 11/05/2023/4:50:18 PM    Final       HISTORY:   Past Medical History:  Diagnosis Date   Arthritis    shoulder   Breast cancer ([name])    Cancer ([name])    left breast ca   Colon cancer ([name])    [name], stage II 11/12/2011   Sigmoid lesion 5.9 cm  40 nodes negative but focus of cancer in a diverticum   Pre-op CEA 5.2 with lab normal up to 2.5 resected 09/21/05   Xeloda adjuvant chemotherapy   Hypertension    Polyneuropathy 02/03/2023   Right shoulder pain 04/12/2023    Past Surgical History:  Procedure Laterality Date   COLONOSCOPY     HEMICOLECTOMY  2007   MASTECTOMY WITH AXILLARY LYMPH NODE DISSECTION Left 03/14/2019   Procedure: LEFT MASTECTOMY WITH TARGETED LYMPH NODE DISSECTION;  Surgeon: Curvin Deward MOULD, MD;  Location: [facility] OR;  Service: General;  Laterality: Left;   SENTINEL NODE BIOPSY Left 03/14/2019   Procedure: Left Axillary Sentinel Lymph  Node Biopsy;  Surgeon: Curvin Deward MOULD, MD;  Location: [facility] OR;  Service: General;  Laterality: Left;   TONSILLECTOMY     WISDOM TOOTH EXTRACTION      Family History  Problem Relation Age of Onset   Cancer Maternal Aunt        colon cancer     Social History:  reports that she has never smoked. She has never used smokeless tobacco. She reports that she does not drink alcohol and does not use drugs.The patient is alone today.  Allergies: No Known Allergies  Current Medications: Current Outpatient Medications  Medication Sig Dispense Refill   Biotin 1 MG CAPS Take by mouth.     bisacodyl (DULCOLAX) 5 MG EC tablet Take 5 mg by mouth daily as needed for moderate constipation. 2 capsules every night     co-enzyme Q-10 30 MG capsule Take 100 mg by mouth daily.     fish oil-omega-3 fatty acids 1000 MG capsule Take 1 g by mouth daily.     gabapentin  (NEURONTIN ) 100 MG capsule Take 1 capsule (100 mg total) by mouth at bedtime. (Patient not taking: Reported on  05/02/2023) 30 capsule 5   MAGNESIUM PO Take 1 tablet by mouth every evening. 600 mg     Multiple Vitamin (MULTIVITAMIN) capsule Take 1 capsule by mouth daily.     ondansetron  (ZOFRAN ) 4 MG tablet Take 1 tablet (4 mg total) by mouth every 8 (eight) hours as needed for nausea or vomiting. (Patient not taking: Reported on 05/02/2023) 20 tablet 2   polyethylene glycol (MIRALAX / GLYCOLAX) 17 g packet Take 17 g by mouth as needed. constipation     No current facility-administered medications for this visit.   Facility-Administered Medications Ordered in Other Visits  Medication Dose Route Frequency Provider Last Rate Last Admin   sodium chloride  flush (NS) 0.9 % injection  10 mL  10 mL Intracatheter PRN [name], MD   10 mL at 11/30/23 1657

## 2023-11-30 NOTE — Patient Instructions (Signed)
 Fam-Trastuzumab Deruxtecan Injection What is this medication? FAM-TRASTUZUMAB DERUXTECAN (fam-tras TOOZ eu mab DER ux TEE kan) treats some types of cancer. It works by blocking a protein that causes cancer cells to grow and multiply. This helps to slow or stop the spread of cancer cells. This medicine may be used for other purposes; ask your health care provider or pharmacist if you have questions. COMMON BRAND NAME(S): ENHERTU What should I tell my care team before I take this medication? They need to know if you have any of these conditions: Heart disease Heart failure Infection, especially a viral infection, such as chickenpox, cold sores, or herpes Liver disease Lung or breathing disease, such as asthma or COPD An unusual or allergic reaction to fam-trastuzumab deruxtecan, other medications, foods, dyes, or preservatives Pregnant or trying to get pregnant Breast-feeding How should I use this medication? This medication is injected into a vein. It is given by your care team in a hospital or clinic setting. A special MedGuide will be given to you before each treatment. Be sure to read this information carefully each time. Talk to your care team about the use of this medication in children. Special care may be needed. Overdosage: If you think you have taken too much of this medicine contact a poison control center or emergency room at once. NOTE: This medicine is only for you. Do not share this medicine with others. What if I miss a dose? It is important not to miss your dose. Call your care team if you are unable to keep an appointment. What may interact with this medication? Interactions are not expected. This list may not describe all possible interactions. Give your health care provider a list of all the medicines, herbs, non-prescription drugs, or dietary supplements you use. Also tell them if you smoke, drink alcohol, or use illegal drugs. Some items may interact with your  medicine. What should I watch for while using this medication? Visit your care team for regular checks on your progress. Tell your care team if your symptoms do not start to get better or if they get worse. This medication may increase your risk of getting an infection. Call your care team for advice if you get a fever, chills, sore throat, or other symptoms of a cold or flu. Do not treat yourself. Try to avoid being around people who are sick. Avoid taking medications that contain aspirin, acetaminophen, ibuprofen, naproxen, or ketoprofen unless instructed by your care team. These medications may hide a fever. Be careful brushing or flossing your teeth or using a toothpick because you may get an infection or bleed more easily. If you have any dental work done, tell your dentist you are receiving this medication. This medication may cause dry eyes and blurred vision. If you wear contact lenses, you may feel some discomfort. Lubricating eye drops may help. See your care team if the problem does not go away or is severe. Talk to your care team if you may be pregnant. Serious birth defects can occur if you take this medication during pregnancy and for 7 months after the last dose. If your partner can get pregnant, use a condom during sex while taking this medication and for 4 months after the last dose. Do not breastfeed while taking this medication and for 7 months after the last dose. This medication may cause infertility. Talk to your care team if you are concerned about your fertility. What side effects may I notice from receiving this medication? Side effects  that you should report to your care team as soon as possible: Allergic reactions--skin rash, itching, hives, swelling of the face, lips, tongue, or throat Dry cough, shortness of breath or trouble breathing Infection--fever, chills, cough, sore throat, wounds that don't heal, pain or trouble when passing urine, general feeling of discomfort or  being unwell Heart failure--shortness of breath, swelling of the ankles, feet, or hands, sudden weight gain, unusual weakness or fatigue Unusual bruising or bleeding Side effects that usually do not require medical attention (report these to your care team if they continue or are bothersome): Constipation Diarrhea Hair loss Muscle pain Nausea Vomiting This list may not describe all possible side effects. Call your doctor for medical advice about side effects. You may report side effects to FDA at 1-800-FDA-1088. Where should I keep my medication? This medication is given in a hospital or clinic. It will not be stored at home. NOTE: This sheet is a summary. It may not cover all possible information. If you have questions about this medicine, talk to your doctor, pharmacist, or health care provider.  2024 Elsevier/Gold Standard (2022-12-24 00:00:00)

## 2023-11-30 NOTE — Assessment & Plan Note (Signed)
 Numerous heterogeneously enhancing, internally necrotic lesions in the right hepatic lobe CT in December 2023, which was consistent with metastatic disease. Largest lesion measures up to 6.7 cm and there are at least 6 lesions.  Liver biopsy was consistent with metastatic breast cancer with negative estrogen and progesterone receptors and positive HER2 receptor. She was initially treated with trastuzumab  with an excellent response with significant decrease in the size of her liver lesions and sustained response. She later had progression of this disease in the chest wall and was switched to Enhertu .  CT chest, abdomen, and pelvis in March revealed stable subtle liver lesions with at least 1 lesion is no longer visualized.   CT chest, abdomen and pelvis in October 2024 revealed a decrease in the hepatic lesions with no new lesions seen.  CT imaging in April was fairly stable except a slight increase in the left adrenal nodule. There was a stable vague liver lesion and stable bone metatasis. She continues Enhertu  every 3 weeks without significant difficulty.  Her main complaint is excessive tearing from the treatment.  She has chronic pain due to polyneuropathy in the right hand.  She will proceed with a 33rd cycle of Enhertu  today.  I will plan to see her back in 3 weeks with a CBC, comprehensive metabolic panel and echocardiogram prior to a 34th cycle of Enhertu .

## 2023-11-30 NOTE — Assessment & Plan Note (Signed)
 Bone metastases diagnosed in December 2022, for which she was placed on zoledronic  acid every 4 weeks. She is now receiving zoledronic  acid every 6 weeks to correspond with her chemotherapy. CT imaging in April 2025 did not reveal any evidence of progressive disease.

## 2023-12-02 ENCOUNTER — Ambulatory Visit

## 2023-12-02 ENCOUNTER — Other Ambulatory Visit

## 2023-12-02 ENCOUNTER — Ambulatory Visit: Admitting: Oncology

## 2023-12-07 ENCOUNTER — Encounter: Payer: Self-pay | Admitting: Oncology

## 2023-12-07 ENCOUNTER — Encounter: Payer: Self-pay | Admitting: Hematology and Oncology

## 2023-12-20 NOTE — Progress Notes (Signed)
 [facility]  [address] [place],  [place]  [address] [phone]  Clinic Day:  12/21/2023  Referring physician: [name], MD  ASSESSMENT & PLAN:  Assessment: Malignant neoplasm of upper-outer quadrant of left breast in female, estrogen receptor negative ([name]) History of stage IIB (T2c N1 M0) HER2 receptor positive, ER/PR negative, left breast cancer diagnosed in June 2020.  She was treated with neoadjuvant Kadcyla /Perjeta  followed by left mastectomy.  She had a complete response in breast and nodes. She had only received two doses of adjuvant trastuzumab  before discontinuing in December 2020. She developed recurrent disease in 2022.   Malignant neoplasm metastatic to liver [facility]) Numerous heterogeneously enhancing, internally necrotic lesions in the right hepatic lobe CT in December 2023, which was consistent with metastatic disease. Largest lesion measures up to 6.7 cm and there are at least 6 lesions.  Liver biopsy was consistent with metastatic breast cancer with negative estrogen and progesterone receptors and positive HER2 receptor. She was initially treated with trastuzumab  with an excellent response with significant decrease in the size of her liver lesions and sustained response. She later had progression of this disease in the chest wall and was switched to Enhertu .  CT chest, abdomen, and pelvis in March revealed stable subtle liver lesions with at least 1 lesion is no longer visualized. CT chest, abdomen and pelvis in October 2024 revealed a decrease in the hepatic lesions with no new lesions seen.  CT imaging in April was fairly stable except a slight increase in the left adrenal nodule. There was a stable vague liver lesion and stable bone metatasis. She continues Enhertu  every 3 weeks without significant difficulty.     Malignant neoplasm metastatic to bone [facility]) Bone metastases diagnosed in December 2022, for which she was placed on zoledronic  acid every 4 weeks.  She is now receiving zoledronic  acid every 6 weeks to correspond with her chemotherapy. CT imaging in April 2025 did not reveal any evidence of progressive disease.  Chest wall recurrence of breast cancer, left ([name]) Left chest wall recurrence diagnosed in January 2023.  Biopsy revealed carcinoma consistent with her previous breast cancer with negative estrogen and progesterone receptors and positive HER2 receptor.  She was initially treated with docetaxel /trastuzumab  and received 10 cycles with a good response.  She then had recurrence in the left chest wall in September 2023, so was switched to Enhertu  good response.  There is no recurrence in the left chest wall.   Polyneuropathy She has had chronic left hand pain, initially thought to be related to lymphedema.  Her lymphedema was controlled. She has decreased strength and muscle wasting.  She was referred to neurology for further evaluation.  EMG revealed polyneuropathy.  She has now seen orthopedics and had an MRI of the left brachial plexus that was unremarkable, but limited due to patient motion.  She had been on gabapentin  100 mg at bedtime, but discontinued that on her own. She saw Dr. [name] in neurology in October 2024 and underwent repeat EMG, which was more abnormal. She has been seen by Dr. [name] with [name] in [place], who did not feel surgery was a good option. The patient no longer sees occupational therapy, but does exercises at home.  Plan: She informed me that she was told about a procedure where they can place stents in her eyes to help with her excessive tearing. She states that she has to make an appointment with her ophthalmologist. Her day 1 cycle 34 of Enhertu   is scheduled on 12/21/2023. She has a WBC of 4.8, hemoglobin of 12.8, and platelet count of 159,000. Her CMP is normal other than a elevated calcium of 10.5 and AST of 47. Her last CT scan was in April which revealed multifocal sclerotic bone metastases which are similar  to the previous and  stable areas of compression deformity at L2 and T6. The left adrenal nodule appears slightly larger but there is no developing new lymph node enlargement or soft tissue mass, fatty liver infiltration with slightly nodular contour, and the vague low-attenuation liver lesion on previous is not well seen. We will plan to repeat this in October. I will see her back in 3 weeks with CBC and CMP. The patient understands the plans discussed today and is in agreement with them.  She knows to contact our office if she develops concerns prior to her next appointment.  I provided 12 minutes of face-to-face time during this encounter and > 50% was spent counseling as documented under my assessment and plan.   [name], MD  [facility] - A DEPT OF [name] [address] [place] [address] Dept: [phone] Dept Fax: [phone]   [name].  CHIEF COMPLAINT:  CC: Recurrent HER2 receptor positive breast cancer  Current Treatment: Enhertu  every 3 weeks  HISTORY OF PRESENT ILLNESS:  Jasmin Lewis is a 89 year old woman with recurrent HER2 receptor positive. She was diagnosed with stage IIB (T2 N1 M0) HER2 receptor positive and hormone receptor negative left breast cancer in June 2020 when she was able to palpate a mass of the left breast. Mammogram and ultrasound in May 2020 revealed a 2.9 cm irregular mass in the upper outer left breast as well as two adjacent abnormal left axillary lymph nodes. Biopsy was obtained on June 3rd and revealed invasive ductal carcinoma and DCIS with lymphovascular invasion. Left axillary lymph node was positive for invasive ductal carcinoma. HER2 was positive 3+. Estrogen and progesterone receptors were both negative at 0%, and Ki67 was 70%. Breast MRI from July revealed a 3.3 cm mass of the lateral portion of the left breast as well  as significant non mass enhancement surrounding this mass and extending anteriorly into the nipple base, with largest diameter in the anterior to posterior axis, measuring 7.2 centimeters. Three enlarged left axillary lymph nodes. Right breast was negative.  PET imaging in July revealed no evidence of additional metastatic involvement. She received neoadjuvant trastuzumab /pertuzumab  for 6 cycles. Repeat breast MRI in October revealed complete resolution of the enhancing mass and associated non mass enhancement within the left breast. Previously identified left axillary lymphadenopathy was no longer identified. She underwent left mastectomy and targeted lymph node dissection in November 2020.  Pathology revealed evidence of malignancy within the breast and three sentinel lymph nodes were negative for malignancy (0/3). She continued maintenance trastuzumab  injections for two more doses, but this was discontinued in December 2020 as she refused further ECHO scans.    Of note, she also has a history of stage II colon cancer, diagnosed in May 2007. Preop CEA was 5.2. She underwent surgical resection and surgical pathology revealed a 5.9 x 3.8 x 1.2 cm moderate to well-differentiated adenocarcinoma penetrating the muscular wall. Forty lymph nodes negative. No vascular or lymphatic invasion. Multiple diverticula with microabscess formation and inflammation. Carcinoma present in at least 1 diverticulum. She received adjuvant chemotherapy with capecitabine for  9 months. Colonoscopy from August 2013 revealed 2 polyps which were resected and chronic diverticulosis.  In December 2022, she was evaluated at the [facility] ED for abdominal and back pain.  CT abdomen pelvis revealed new lytic lesion involving the L2 vertebral body concerning for metastatic disease, as well as multiple hepatic rim-enhancing fluid collections with significant surrounding edema and an additional subtle 1.5 cm hypodensity in the hepatic dome. Given  clinical history and presence of a new L2 lesion, leading differential consideration is metastatic disease. MRI abdomen in revealed numerous heterogeneously enhancing, internally necrotic lesions in the right hepatic lobe, highly concerning for metastatic disease. Largest lesion measures up to 6.7 cm in hepatic segment VI. Enhancing osseous lesions in the L2 vertebral body, both iliac bones, and in the sacrum, highly concerning for osseous metastases was also seen.    PET scan revealed widespread hypermetabolic disease in the left chest wall, left axillary, subpectoral and supraclavicular nodes, liver, left adrenal and bone.  MRI thoracic/lumbar spine revealed widespread osseous metastasis with the largest deposit replacing L2 vertebral body where there was a compression fracture with mild height loss and early epidural tumor extension affecting L2-3 foramina.  MRI brain was negative for metastatic disease.  Biopsy of of the left chest wall  was consistent with recurrent ductal carcinoma of the breast.  HER2 was positive, estrogen and progesterone receptors were negative.  She was initially treated with trastuzumab , as well as zoledronic  acid, beginning in January 2023 with an initial response.   Oncology History Overview Note  Cancer Staging Malignant neoplasm of upper-outer quadrant of left breast in female, estrogen receptor negative ([name]) Staging form: Breast, AJCC 8th Edition - Clinical stage from 10/11/2018: Stage IIB (cT2, cN1, cM0, G3, ER-, PR-, HER2+) - Signed by Lanny Callander, MD on 11/02/2018 - Clinical: No stage assigned - Unsigned    History of colon cancer, stage II  09/2005 Initial Diagnosis   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 Findings were a 5.9 x 3.8 x 1.2 cm moderate to well-differentiated adenocarcinoma penetrating the muscular wall. Forty lymph nodes negative. No vascular or lymphatic  invasion. Multiple diverticula with microabscess formation and inflammation. Carcinoma present in at least 1 diverticulum. Preop CEA 5.2 with lab normal range 0 to 2.5. Preop CT scan with no obvious additional pathology.    2007 -  Chemotherapy   She received oral Xeloda chemotherapy as an adjuvant. She declined treatment on a clinical trial.    11/12/2011 Initial Diagnosis   [name], stage II   12/09/2011 Procedure   Followup colonoscopy done on 12/09/2011. She was found to have 2 polyps which were removed. Chronic diverticulosis.     Malignant neoplasm of upper-outer quadrant of left breast in female, estrogen receptor negative ([name])  09/26/2018 Mammogram   Mammogram/US  of left breast 09/26/18 IMPRESSION:  1. 2.9cm irregular mass in the upper outer left breast corresponds to the palpable abnormality. This is highly suspicious for breast carcinoma.  2. Two adjacent abnormal left axillary  Lymph nodes suspicious for metastatic adenopathy. There is a third borderline abnormal left axillary LN with a cortex thickened to 4mm.  3. Benign right breast cyst. No evidence of right breast malignancy.    10/11/2018 Cancer Staging   Staging form: Breast, AJCC 8th Edition - Clinical stage from 10/11/2018: Stage IIB (cT2, cN1, cM0, G3, ER-, PR-, HER2+) - Signed by Lanny Callander, MD on  11/02/2018   10/11/2018 Initial Biopsy   Diagnosis 10/11/18 1. Breast, left, needle core biopsy, upper outer left 2 o'clock - INVASIVE DUCTAL CARCINOMA. - DUCTAL CARCINOMA IN SITU. - LYMPHOVASCULAR INVASION IS IDENTIFIED. - SEE COMMENT. 2. Lymph node, needle/core biopsy, left axilla - INVASIVE DUCTAL CARCINOMA. - SEE COMMENT.   10/11/2018 Receptors her2   The tumor cells are POSITIVE for Her2 (3+). Estrogen Receptor: 0%, NEGATIVE Progesterone Receptor: 0%, NEGATIVE Proliferation Marker Ki67: 70%   11/02/2018 Initial Diagnosis   Malignant neoplasm of upper-outer quadrant of left breast in female, estrogen receptor  negative ([name])   11/15/2018 Breast MRI   MRI breast 11/15/18  IMPRESSION: 1. 3.3 centimeter mass in the LATERAL portion of the LEFT breast consistent with known malignancy. 2. There is significant non mass enhancement surrounding this mass and extending anteriorly into the nipple base, with largest diameter in the anterior to posterior axis, measuring 7.2 centimeters. 3. If the patient would consider breast conservation, additional MR guided core biopsies are recommended. Consider biopsy of the inferior and anterior extent of the non mass enhancement to document extent of disease. 4. Three enlarged LEFT axillary lymph nodes. 5. RIGHT breast is negative.   11/15/2018 PET scan   PET 11/15/18 IMPRESSION: Hypermetabolic left breast lesion compatible with known primary. Hypermetabolic left axillary lymph nodes are consistent with metastatic disease.   No evidence for additional hypermetabolic metastatic involvement in the neck, chest, abdomen, or pelvis.   11/17/2018 - 03/01/2019 Chemotherapy   Neo-adjuvant Kadcyla  and perjeta  q3weeks for 6 cycles starting 11/17/18. Stopped before surgery    11/29/2018 Pathology Results   Diagnosis 1. Breast, left, needle core biopsy, inferior anterior (cylinder clip) - INVASIVE DUCTAL CARCINOMA. - DUCTAL CARCINOMA IN SITU. - LYMPHOVASCULAR INVASION IS IDENTIFIED. - SEE COMMENT. 2. Breast, left, needle core biopsy, central posterior (barbell clip) - INVASIVE DUCTAL CARCINOMA. - LYMPHOVASCULAR INVASION IS IDENTIFIED.   02/12/2019 Breast MRI   IMPRESSION: 1. Complete resolution of previously identified enhancing mass and associated non mass enhancement within the left breast. This is consistent with excellent response to chemotherapy. No residual or suspicious findings are identified. 2. No MRI evidence of malignancy on the right. 3. Previously identified left axillary lymphadenopathy not definitively seen on today's study. However, this may be due  to decreased field-of-view compared to prior study.     03/14/2019 Cancer Staging   Staging form: Breast, AJCC 8th Edition - Pathologic stage from 03/14/2019: pT0, pN0, cM0, GX, ER: Unknown, PR: Unknown, HER2: Not Assessed - Signed by Lanny Callander, MD on 03/28/2019   03/14/2019 Surgery   LEFT MASTECTOMY WITH TARGETED LYMPH NODE DISSECTION and Left Axillary Sentinel Lymph  Node Biopsy by Dr Curvin 03/14/19    03/14/2019 Pathology Results   FINAL MICROSCOPIC DIAGNOSIS:   A. LYMPH NODE, LEFT, SENTINEL, BIOPSY:  - There is no evidence of carcinoma in 1 of 1 lymph node (0/1).   B. BREAST, LEFT, MASTECTOMY:  - Benign breast parenchyma with treatment-related changes.  - There is no evidence of malignancy.  - See oncology table below.   C. LYMPH NODE, LEFT #1, SENTINEL, BIOPSY:  - There is no evidence of carcinoma in 1 of 1 lymph node (0/1).   D. LYMPH NODE, LEFT #2, SENTINEL, BIOPSY:  - There is no evidence of carcinoma in 1 of 1 lymph node (0/1).     04/04/2019 - 04/25/2019 Chemotherapy   Maintenance Herceptin  injections every 3 weeks starting 04/03/19 to complete 1 year of treatment that was started in 11/2018.  Stopped after 2nd dose as she will not be repeating Echos.    06/03/2021 - 06/24/2021 Chemotherapy   Patient is on Treatment Plan : BREAST Trastuzumab  q21d X 11 Cycles     06/25/2021 - 01/01/2022 Chemotherapy   Patient is on Treatment Plan : BREAST Docetaxel  + Trastuzumab  + Pertuzumab  (THP) q21d     06/25/2021 - 01/02/2022 Chemotherapy   Patient is on Treatment Plan : BREAST Docetaxel  + Trastuzumab  + Pertuzumab  (THP) q21d     09/14/2021 Imaging   CT chest/abdomen/pelvis  IMPRESSION:  1. Interval resolution of previously seen bulky left axillary and  subpectoral lymphadenopathy. No persistently enlarged lymph nodes.  2. Multiple liver lesions are significantly diminished in size.  3. Widespread osseous metastatic disease, with an interval increase  in sclerosis of several previously  lytic lesions.  4. Constellation of findings is consistent with treatment response  of metastatic disease  5. New, although age indeterminate pathologic wedge deformity of the  T6 vertebral body as well as an increased pathologic wedge deformity  of the L2 vertebral body.  6. Coronary artery disease.  Aortic Atherosclerosis (ICD10-I70.0).    01/26/2022 -  Chemotherapy   Patient is on Treatment Plan : BREAST METASTATIC Fam-Trastuzumab Deruxtecan-nxki  (Enhertu ) (5.4) q21d     Malignant neoplasm metastatic to liver ([name])  04/17/2021 Imaging   CT ABDOMEN AND PELVIS WITH CONTRAST: -New lytic lesion involving the L2 vertebral body (6:72, 2:26) with minimal cortical breakthrough superiorly, but with preservation of vertebral body height, concerning for metastatic disease.  -There are several indeterminate hepatic lesions as described above. In addition to the lesions described above, there is an additional subtle 1.5 cm hypodensity in the hepatic dome (2:7). Given clinical history and presence of a new L2 lesion, leading differential consideration is metastatic disease. Recommend further evaluation with MRI of the abdomen with and without contrast.   05/12/2021 Imaging   MRI ABDOMEN WITH AND WITHOUT CONTRAST: Numerous heterogeneously enhancing, internally necrotic lesions in the right hepatic lobe, highly concerning for metastatic disease. Largest lesion measures up to 6.7 cm in hepatic segment VI.   Enhancing osseous lesions in the L2 vertebral body, both iliac bones, and in the sacrum, highly concerning for osseous metastases.   05/15/2021 Initial Diagnosis   Liver metastases ([name])   05/27/2021 PET scan   1. Widespread recurrent/metastatic disease in this patient who is  status post bilateral mastectomy. Left chest wall recurrence with  left axillary/subpectoral, supraclavicular nodal metastasis.  2. Hepatic, left adrenal, and widespread osseous metastasis.  3. Incidental findings, including:  Right nephrolithiasis. Tiny  hiatal hernia.    05/27/2021 Imaging   MRI LUMBAR AND THORACIC SPINE: Thoracic spine:  Osseous metastatic disease involving each level. The most dramatic deposits are at T4 and T6 where there is extraosseous/epidural tumor. No cord compression but there is foraminal impingement on the left at T6-7 and on the right at T3-4. Early dorsal epidural tumor at the level of T10 and T3.   Lumbar spine:  1. Widespread osseous metastatic disease with largest deposit  replacing the L2 body where there is a compression fracture with  mild height loss. Also at this level is early epidural tumor  extension likely affecting both L2-3 foramina.  2. 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    05/27/2021 Imaging   MRI HEAD WITH AND WITHOUT CONTRAST: Negative for metastatic disease to the brain or calvarium.   06/03/2021 - 06/24/2021 Chemotherapy   Patient is on Treatment Plan : BREAST  Trastuzumab  q21d X 11 Cycles     06/25/2021 - 01/01/2022 Chemotherapy   Patient is on Treatment Plan : BREAST Docetaxel  + Trastuzumab  + Pertuzumab  (THP) q21d     06/25/2021 - 01/02/2022 Chemotherapy   Patient is on Treatment Plan : BREAST Docetaxel  + Trastuzumab  + Pertuzumab  (THP) q21d     09/14/2021 Imaging   CT chest/abdomen/pelvis  IMPRESSION:  1. Interval resolution of previously seen bulky left axillary and  subpectoral lymphadenopathy. No persistently enlarged lymph nodes.  2. Multiple liver lesions are significantly diminished in size.  3. Widespread osseous metastatic disease, with an interval increase  in sclerosis of several previously lytic lesions.  4. Constellation of findings is consistent with treatment response  of metastatic disease  5. New, although age indeterminate pathologic wedge deformity of the  T6 vertebral body as well as an increased pathologic wedge deformity  of the L2 vertebral body.  6. Coronary artery disease.  Aortic  Atherosclerosis (ICD10-I70.0).    01/26/2022 -  Chemotherapy   Patient is on Treatment Plan : BREAST METASTATIC Fam-Trastuzumab Deruxtecan-nxki  (Enhertu ) (5.4) q21d     Malignant neoplasm metastatic to bone ([name])  04/17/2021 Imaging   CT ABDOMEN AND PELVIS WITH CONTRAST: -New lytic lesion involving the L2 vertebral body (6:72, 2:26) with minimal cortical breakthrough superiorly, but with preservation of vertebral body height, concerning for metastatic disease.  -There are several indeterminate hepatic lesions as described above. In addition to the lesions described above, there is an additional subtle 1.5 cm hypodensity in the hepatic dome (2:7). Given clinical history and presence of a new L2 lesion, leading differential consideration is metastatic disease. Recommend further evaluation with MRI of the abdomen with and without contrast.   05/12/2021 Imaging   MRI ABDOMEN WITH AND WITHOUT CONTRAST: Numerous heterogeneously enhancing, internally necrotic lesions in the right hepatic lobe, highly concerning for metastatic disease. Largest lesion measures up to 6.7 cm in hepatic segment VI.   Enhancing osseous lesions in the L2 vertebral body, both iliac bones, and in the sacrum, highly concerning for osseous metastases.   05/15/2021 Initial Diagnosis   Bone metastases ([name])   05/27/2021 PET scan   1. Widespread recurrent/metastatic disease in this patient who is  status post bilateral mastectomy. Left chest wall recurrence with  left axillary/subpectoral, supraclavicular nodal metastasis.  2. Hepatic, left adrenal, and widespread osseous metastasis.  3. Incidental findings, including: Right nephrolithiasis. Tiny  hiatal hernia.    05/27/2021 Imaging   MRI LUMBAR AND THORACIC SPINE: Thoracic spine:  Osseous metastatic disease involving each level. The most dramatic deposits are at T4 and T6 where there is extraosseous/epidural tumor. No cord compression but there is foraminal impingement on the  left at T6-7 and on the right at T3-4. Early dorsal epidural tumor at the level of T10 and T3.   Lumbar spine:  1. Widespread osseous metastatic disease with largest deposit  replacing the L2 body where there is a compression fracture with  mild height loss. Also at this level is early epidural tumor  extension likely affecting both L2-3 foramina.  2. 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    05/27/2021 Imaging   MRI HEAD WITH AND WITHOUT CONTRAST: Negative for metastatic disease to the brain or calvarium.   06/03/2021 - 06/24/2021 Chemotherapy   Patient is on Treatment Plan : BREAST Trastuzumab  q21d X 11 Cycles     06/25/2021 - 01/01/2022 Chemotherapy   Patient is on Treatment Plan : BREAST Docetaxel  + Trastuzumab  + Pertuzumab  (  THP) q21d     06/25/2021 - 01/02/2022 Chemotherapy   Patient is on Treatment Plan : BREAST Docetaxel  + Trastuzumab  + Pertuzumab  (THP) q21d     09/14/2021 Imaging   CT chest/abdomen/pelvis  IMPRESSION:  1. Interval resolution of previously seen bulky left axillary and  subpectoral lymphadenopathy. No persistently enlarged lymph nodes.  2. Multiple liver lesions are significantly diminished in size.  3. Widespread osseous metastatic disease, with an interval increase  in sclerosis of several previously lytic lesions.  4. Constellation of findings is consistent with treatment response  of metastatic disease  5. New, although age indeterminate pathologic wedge deformity of the  T6 vertebral body as well as an increased pathologic wedge deformity  of the L2 vertebral body.  6. Coronary artery disease.  Aortic Atherosclerosis (ICD10-I70.0).    01/26/2022 -  Chemotherapy   Patient is on Treatment Plan : BREAST METASTATIC Fam-Trastuzumab Deruxtecan-nxki  (Enhertu ) (5.4) q21d         INTERVAL HISTORY:  Jasmin Lewis is here today for repeat clinical assessment for her recurrent HER2 receptor positive breast cancer. Patient states that she  feels not the best and complains of tearing of the eyes and left hip pain. She informed me that she was told about a procedure where they can place stents in her eyes to help with her excessive tearing. She states that she has to make an appointment with her ophthalmologist. Her day 1 cycle 34 of Enhertu  is scheduled on 12/21/2023. She has a WBC of 4.8, hemoglobin of 12.8, and platelet count of 159,000. Her CMP is normal other than a elevated calcium of 10.5 and AST of 47. Her last CT scan was in April which revealed multifocal sclerotic bone metastases which are similar to the previous and  stable areas of compression deformity at L2 and T6. The left adrenal nodule appears slightly larger but there is no developing new lymph node enlargement or soft tissue mass, fatty liver infiltration with slightly nodular contour, and the vague low-attenuation liver lesion on previous is not well seen. We will plan to repeat this in October. I will see her back in 3 weeks with CBC and CMP.   She denies fever, chills, night sweats, or other signs of infection. She denies cardiorespiratory and gastrointestinal issues. She  denies pain. Her appetite is good and Her weight has increased 1 pounds over last 3 weeks.   REVIEW OF SYSTEMS:   Review of Systems  Constitutional: Negative.  Negative for appetite change, chills, fatigue, fever and unexpected weight change.  HENT:  Negative.  Negative for lump/mass, mouth sores and sore throat.   Eyes:  Positive for eye problems (excessive tearing).  Respiratory: Negative.  Negative for chest tightness, cough, hemoptysis, shortness of breath and wheezing.   Cardiovascular: Negative.  Negative for chest pain, leg swelling and palpitations.  Gastrointestinal: Negative.  Negative for abdominal distention, abdominal pain, blood in stool, constipation, diarrhea, nausea and vomiting.  Endocrine: Negative.  Negative for hot flashes.  Genitourinary: Negative.  Negative for difficulty  urinating, dysuria, frequency and hematuria.   Musculoskeletal:  Positive for arthralgias and gait problem (uses a cane). Negative for back pain, flank pain and myalgias.  Skin: Negative.  Negative for rash.  Neurological:  Positive for extremity weakness (atrophy and severe left hand weakness) and gait problem (uses a cane). Negative for dizziness, headaches, light-headedness, numbness, seizures and speech difficulty.  Hematological: Negative.  Negative for adenopathy. Does not bruise/bleed easily.  Psychiatric/Behavioral: Negative.  Negative for depression and  sleep disturbance. The patient is not nervous/anxious.     VITALS:  Blood pressure (!) 158/80, pulse 72, temperature 98 F (36.7 C), temperature source Oral, resp. rate 18, height 5' 0.25 (1.53 m), weight 109 lb 9.6 oz (49.7 kg), SpO2 98%.  Wt Readings from Last 3 Encounters:  12/21/23 109 lb 9.6 oz (49.7 kg)  11/30/23 108 lb 11.2 oz (49.3 kg)  11/09/23 110 lb (49.9 kg)    Body mass index is 21.23 kg/m.  Performance status (ECOG): 1 - Symptomatic but completely ambulatory  PHYSICAL EXAM:   Physical Exam Vitals and nursing note reviewed.  Constitutional:      General: She is not in acute distress.    Appearance: Normal appearance. She is normal weight. She is not ill-appearing, toxic-appearing or diaphoretic.  HENT:     Head: Normocephalic and atraumatic.     Right Ear: Tympanic membrane, ear canal and external ear normal. There is no impacted cerumen.     Left Ear: Tympanic membrane, ear canal and external ear normal. There is no impacted cerumen.     Nose: Nose normal. No congestion or rhinorrhea.     Mouth/Throat:     Mouth: Mucous membranes are moist.     Pharynx: Oropharynx is clear. No oropharyngeal exudate or posterior oropharyngeal erythema.  Eyes:     General: No scleral icterus.       Right eye: No discharge.        Left eye: No discharge.     Extraocular Movements: Extraocular movements intact.      Conjunctiva/sclera: Conjunctivae normal.     Pupils: Pupils are equal, round, and reactive to light.     Comments: Excessive tearing  Neck:     Vascular: No carotid bruit.  Cardiovascular:     Rate and Rhythm: Normal rate and regular rhythm.     Pulses: Normal pulses.     Heart sounds: Normal heart sounds. No murmur heard.    No friction rub. No gallop.  Pulmonary:     Effort: Pulmonary effort is normal. No respiratory distress.     Breath sounds: Normal breath sounds. No stridor. No wheezing, rhonchi or rales.  Chest:     Chest wall: No tenderness.  Breasts:    Right: Normal.     Left: Normal.     Comments: Left chest wall is negative. Right breast is without masses Abdominal:     General: Bowel sounds are normal. There is no distension.     Palpations: Abdomen is soft. There is no hepatomegaly, splenomegaly or mass.     Tenderness: There is no abdominal tenderness. There is no right CVA tenderness, left CVA tenderness, guarding or rebound.     Hernia: No hernia is present.  Musculoskeletal:        General: No swelling, tenderness, deformity or signs of injury. Normal range of motion.     Right hand: Normal.     Left hand: Decreased strength.     Cervical back: Normal range of motion and neck supple. No rigidity or tenderness.     Right lower leg: No edema.     Left lower leg: No edema.     Comments: Wasting of the left hand  Lymphadenopathy:     Cervical: No cervical adenopathy.     Right cervical: No superficial, deep or posterior cervical adenopathy.    Left cervical: No superficial, deep or posterior cervical adenopathy.     Upper Body:     Right upper body:  No supraclavicular, axillary or pectoral adenopathy.     Left upper body: No supraclavicular, axillary or pectoral adenopathy.  Skin:    General: Skin is warm and dry.     Coloration: Skin is not jaundiced or pale.     Findings: No bruising, erythema, lesion or rash.  Neurological:     General: No focal deficit  present.     Mental Status: She is alert and oriented to person, place, and time. Mental status is at baseline.     Cranial Nerves: No cranial nerve deficit.     Sensory: No sensory deficit.     Motor: No weakness.     Coordination: Coordination normal.     Gait: Gait normal.     Deep Tendon Reflexes: Reflexes normal.  Psychiatric:        Mood and Affect: Mood normal.        Behavior: Behavior normal.        Thought Content: Thought content normal.        Judgment: Judgment normal.    LABS:      Latest Ref Rng & Units 12/21/2023    1:40 PM 11/30/2023    1:08 PM 11/09/2023    1:04 PM  CBC  WBC 4.0 - 10.5 K/uL 4.8  4.3  4.5   Hemoglobin 12.0 - 15.0 g/dL 87.1  87.4  87.7   Hematocrit 36.0 - 46.0 % 37.0  37.1  37.3   Platelets 150 - 400 K/uL 159  161  136       Latest Ref Rng & Units 12/21/2023    1:40 PM 11/30/2023    1:08 PM 11/09/2023    1:04 PM  CMP  Glucose 70 - 99 mg/dL 89  99  92   BUN 8 - 23 mg/dL 21  18  17    Creatinine 0.44 - 1.00 mg/dL 9.14  9.17  9.22   Sodium 135 - 145 mmol/L 141  140  141   Potassium 3.5 - 5.1 mmol/L 4.4  4.6  4.3   Chloride 98 - 111 mmol/L 107  106  107   CO2 22 - 32 mmol/L 24  25  25    Calcium 8.9 - 10.3 mg/dL 89.4  89.8  9.8   Total Protein 6.5 - 8.1 g/dL 6.8  6.7  6.3   Total Bilirubin 0.0 - 1.2 mg/dL 0.5  0.5  0.4   Alkaline Phos 38 - 126 U/L 62  62  57   AST 15 - 41 U/L 47  39  38   ALT 0 - 44 U/L 22  17  18     Lab Results  Component Value Date   CEA1 1.8 06/03/2021   CEA 0.5 11/13/2012   CEA  Date Value Ref Range Status  06/03/2021 1.8 0.0 - 4.7 ng/mL Final    Comment:    (NOTE)                             Nonsmokers          <3.9                             Smokers             <5.6 [employer] Electrochemiluminescence Immunoassay (ECLIA) Values obtained with different assay methods or kits cannot be used interchangeably.  Results cannot be interpreted as absolute evidence of  the presence or absence of malignant  disease. Performed At: [facility] [address] [place], [place] [id number] Jennette Shorter MD Ey:[id number]   11/13/2012 0.5 0.0 - 5.0 ng/mL Final   Lab Results  Component Value Date   LDH 186 11/12/2011   LDH 187 09/08/2010   LDH 171 09/09/2009   STUDIES:  EXAM: 08/11/2023 CT CHEST, ABDOMEN, AND PELVIS WITH CONTRAST IMPRESSION: Multifocal sclerotic bone metastases are similar to previous. Stable areas of compression deformity at L2 and T6. Separate healing right lateral rib fracture. Left adrenal nodule appears slightly larger today. Attention on follow-up. No developing new lymph node enlargement, soft tissue mass. Fatty liver infiltration with slightly nodular contour. Vague low-attenuation liver lesion on previous is not well seen today. No new liver lesion at this time.   HISTORY:   Past Medical History:  Diagnosis Date   Arthritis    shoulder   Breast cancer ([name])    Cancer ([name])    left breast ca   Colon cancer ([name])    [name], stage II 11/12/2011   Sigmoid lesion 5.9 cm  40 nodes negative but focus of cancer in a diverticum   Pre-op CEA 5.2 with lab normal up to 2.5 resected 09/21/05   Xeloda adjuvant chemotherapy   Hypertension    Polyneuropathy 02/03/2023   Right shoulder pain 04/12/2023    Past Surgical History:  Procedure Laterality Date   COLONOSCOPY     HEMICOLECTOMY  2007   MASTECTOMY WITH AXILLARY LYMPH NODE DISSECTION Left 03/14/2019   Procedure: LEFT MASTECTOMY WITH TARGETED LYMPH NODE DISSECTION;  Surgeon: Curvin Deward MOULD, MD;  Location: [facility] OR;  Service: General;  Laterality: Left;   SENTINEL NODE BIOPSY Left 03/14/2019   Procedure: Left Axillary Sentinel Lymph  Node Biopsy;  Surgeon: Curvin Deward MOULD, MD;  Location: [facility] OR;  Service: General;  Laterality: Left;   TONSILLECTOMY     WISDOM TOOTH EXTRACTION      Family History  Problem Relation Age of Onset   Cancer Maternal Aunt        colon cancer     Social History:  reports  that she has never smoked. She has never used smokeless tobacco. She reports that she does not drink alcohol and does not use drugs.The patient is alone today.  Allergies: No Known Allergies  Current Medications: Current Outpatient Medications  Medication Sig Dispense Refill   Biotin 1 MG CAPS Take by mouth.     bisacodyl (DULCOLAX) 5 MG EC tablet Take 5 mg by mouth daily as needed for moderate constipation. 2 capsules every night     co-enzyme Q-10 30 MG capsule Take 100 mg by mouth daily.     fish oil-omega-3 fatty acids 1000 MG capsule Take 1 g by mouth daily.     gabapentin  (NEURONTIN ) 100 MG capsule Take 1 capsule (100 mg total) by mouth at bedtime. (Patient not taking: Reported on 05/02/2023) 30 capsule 5   MAGNESIUM PO Take 1 tablet by mouth every evening. 600 mg     Multiple Vitamin (MULTIVITAMIN) capsule Take 1 capsule by mouth daily.     ondansetron  (ZOFRAN ) 4 MG tablet Take 1 tablet (4 mg total) by mouth every 8 (eight) hours as needed for nausea or vomiting. (Patient not taking: Reported on 05/02/2023) 20 tablet 2   polyethylene glycol (MIRALAX / GLYCOLAX) 17 g packet Take 17 g by mouth as needed. constipation     No current facility-administered medications for this visit.  Facility-Administered Medications Ordered in Other Visits  Medication Dose Route Frequency Provider Last Rate Last Admin   acetaminophen  (TYLENOL ) tablet 650 mg  650 mg Oral Once [name], MD       dexamethasone  (DECADRON ) injection 10 mg  10 mg Intravenous Once [name], MD       dextrose  5 % solution   Intravenous Once [name], MD       diphenhydrAMINE  (BENADRYL ) capsule 25 mg  25 mg Oral Once [name], MD       fam-trastuzumab deruxtecan-nxki  (ENHERTU ) 200 mg in dextrose  5 % 100 mL chemo infusion  4.05 mg/kg (Treatment Plan Recorded) Intravenous Once [name], MD       palonosetron  (ALOXI ) injection 0.25 mg  0.25 mg Intravenous Once [name], MD       sodium chloride  flush (NS) 0.9 % injection 10 mL  10 mL Intracatheter PRN [name], MD        I,Jasmine CHRISTELLA Arnett ivy as a scribe for Wanda DEL Cornelius, MD.,have documented all relevant documentation on the behalf of Wanda DEL Cornelius, MD,as directed by  Wanda DEL Cornelius, MD while in the presence of Wanda DEL Cornelius, MD.

## 2023-12-21 ENCOUNTER — Inpatient Hospital Stay (HOSPITAL_BASED_OUTPATIENT_CLINIC_OR_DEPARTMENT_OTHER): Admitting: Oncology

## 2023-12-21 ENCOUNTER — Inpatient Hospital Stay

## 2023-12-21 ENCOUNTER — Other Ambulatory Visit: Payer: Self-pay | Admitting: Oncology

## 2023-12-21 ENCOUNTER — Inpatient Hospital Stay: Attending: Hematology and Oncology

## 2023-12-21 ENCOUNTER — Encounter: Payer: Self-pay | Admitting: Oncology

## 2023-12-21 VITALS — BP 158/80 | HR 72 | Temp 98.0°F | Resp 18 | Ht 60.25 in | Wt 109.6 lb

## 2023-12-21 DIAGNOSIS — C50412 Malignant neoplasm of upper-outer quadrant of left female breast: Secondary | ICD-10-CM

## 2023-12-21 DIAGNOSIS — I972 Postmastectomy lymphedema syndrome: Secondary | ICD-10-CM | POA: Diagnosis not present

## 2023-12-21 DIAGNOSIS — C7951 Secondary malignant neoplasm of bone: Secondary | ICD-10-CM

## 2023-12-21 DIAGNOSIS — C50912 Malignant neoplasm of unspecified site of left female breast: Secondary | ICD-10-CM

## 2023-12-21 DIAGNOSIS — Z5112 Encounter for antineoplastic immunotherapy: Secondary | ICD-10-CM | POA: Insufficient documentation

## 2023-12-21 DIAGNOSIS — Z79899 Other long term (current) drug therapy: Secondary | ICD-10-CM | POA: Diagnosis not present

## 2023-12-21 DIAGNOSIS — Z9012 Acquired absence of left breast and nipple: Secondary | ICD-10-CM | POA: Insufficient documentation

## 2023-12-21 DIAGNOSIS — G629 Polyneuropathy, unspecified: Secondary | ICD-10-CM | POA: Insufficient documentation

## 2023-12-21 DIAGNOSIS — C787 Secondary malignant neoplasm of liver and intrahepatic bile duct: Secondary | ICD-10-CM | POA: Diagnosis not present

## 2023-12-21 DIAGNOSIS — K760 Fatty (change of) liver, not elsewhere classified: Secondary | ICD-10-CM | POA: Insufficient documentation

## 2023-12-21 DIAGNOSIS — Z85038 Personal history of other malignant neoplasm of large intestine: Secondary | ICD-10-CM | POA: Insufficient documentation

## 2023-12-21 DIAGNOSIS — Z171 Estrogen receptor negative status [ER-]: Secondary | ICD-10-CM | POA: Insufficient documentation

## 2023-12-21 DIAGNOSIS — Z800 Family history of malignant neoplasm of digestive organs: Secondary | ICD-10-CM | POA: Insufficient documentation

## 2023-12-21 DIAGNOSIS — M6250 Muscle wasting and atrophy, not elsewhere classified, unspecified site: Secondary | ICD-10-CM | POA: Insufficient documentation

## 2023-12-21 DIAGNOSIS — Z1722 Progesterone receptor negative status: Secondary | ICD-10-CM | POA: Diagnosis not present

## 2023-12-21 DIAGNOSIS — S2231XD Fracture of one rib, right side, subsequent encounter for fracture with routine healing: Secondary | ICD-10-CM | POA: Diagnosis not present

## 2023-12-21 DIAGNOSIS — C7989 Secondary malignant neoplasm of other specified sites: Secondary | ICD-10-CM

## 2023-12-21 DIAGNOSIS — E278 Other specified disorders of adrenal gland: Secondary | ICD-10-CM | POA: Insufficient documentation

## 2023-12-21 LAB — CMP (CANCER CENTER ONLY)
ALT: 22 U/L (ref 0–44)
AST: 47 U/L — ABNORMAL HIGH (ref 15–41)
Albumin: 4.4 g/dL (ref 3.5–5.0)
Alkaline Phosphatase: 62 U/L (ref 38–126)
Anion gap: 9 (ref 5–15)
BUN: 21 mg/dL (ref 8–23)
CO2: 24 mmol/L (ref 22–32)
Calcium: 10.5 mg/dL — ABNORMAL HIGH (ref 8.9–10.3)
Chloride: 107 mmol/L (ref 98–111)
Creatinine: 0.85 mg/dL (ref 0.44–1.00)
GFR, Estimated: >60 mL/min (ref >60)
Glucose, Bld: 89 mg/dL (ref 70–99)
Potassium: 4.4 mmol/L (ref 3.5–5.1)
Sodium: 141 mmol/L (ref 135–145)
Total Bilirubin: 0.5 mg/dL (ref 0.0–1.2)
Total Protein: 6.8 g/dL (ref 6.5–8.1)

## 2023-12-21 LAB — CBC WITH DIFFERENTIAL (CANCER CENTER ONLY)
Abs Immature Granulocytes: 0.01 K/uL (ref 0.00–0.07)
Basophils Absolute: 0 K/uL (ref 0.0–0.1)
Basophils Relative: 1 %
Eosinophils Absolute: 0.1 K/uL (ref 0.0–0.5)
Eosinophils Relative: 1 %
HCT: 37 % (ref 36.0–46.0)
Hemoglobin: 12.8 g/dL (ref 12.0–15.0)
Immature Granulocytes: 0 %
Lymphocytes Relative: 37 %
Lymphs Abs: 1.8 K/uL (ref 0.7–4.0)
MCH: 36 pg — ABNORMAL HIGH (ref 26.0–34.0)
MCHC: 34.6 g/dL (ref 30.0–36.0)
MCV: 103.9 fL — ABNORMAL HIGH (ref 80.0–100.0)
Monocytes Absolute: 0.5 K/uL (ref 0.1–1.0)
Monocytes Relative: 11 %
Neutro Abs: 2.4 K/uL (ref 1.7–7.7)
Neutrophils Relative %: 50 %
Platelet Count: 159 K/uL (ref 150–400)
RBC: 3.56 MIL/uL — ABNORMAL LOW (ref 3.87–5.11)
RDW: 14.1 % (ref 11.5–15.5)
WBC Count: 4.8 K/uL (ref 4.0–10.5)
nRBC: 0 % (ref 0.0–0.2)

## 2023-12-21 MED ORDER — ACETAMINOPHEN 325 MG PO TABS
650.0000 mg | ORAL_TABLET | Freq: Once | ORAL | Status: AC
  Administered 2023-12-21 (×2): 650 mg via ORAL
  Filled 2023-12-21: qty 2

## 2023-12-21 MED ORDER — PALONOSETRON HCL INJECTION 0.25 MG/5ML
0.2500 mg | Freq: Once | INTRAVENOUS | Status: AC
  Administered 2023-12-21 (×2): 0.25 mg via INTRAVENOUS
  Filled 2023-12-21: qty 5

## 2023-12-21 MED ORDER — DEXAMETHASONE SODIUM PHOSPHATE 10 MG/ML IJ SOLN
10.0000 mg | Freq: Once | INTRAMUSCULAR | Status: AC
  Administered 2023-12-21 (×2): 10 mg via INTRAVENOUS
  Filled 2023-12-21: qty 1

## 2023-12-21 MED ORDER — FAM-TRASTUZUMAB DERUXTECAN-NXKI CHEMO 100 MG IV SOLR
4.0500 mg/kg | Freq: Once | INTRAVENOUS | Status: AC
  Administered 2023-12-21 (×2): 200 mg via INTRAVENOUS
  Filled 2023-12-21: qty 10

## 2023-12-21 MED ORDER — DIPHENHYDRAMINE HCL 25 MG PO CAPS
25.0000 mg | ORAL_CAPSULE | Freq: Once | ORAL | Status: AC
  Administered 2023-12-21 (×2): 25 mg via ORAL
  Filled 2023-12-21: qty 1

## 2023-12-21 MED ORDER — DEXTROSE 5 % IV SOLN: Freq: Once | INTRAVENOUS | Status: AC

## 2023-12-21 MED ORDER — ZOLEDRONIC ACID 4 MG/5ML IV CONC
3.0000 mg | Freq: Once | INTRAVENOUS | Status: AC
  Administered 2023-12-21 (×2): 3 mg via INTRAVENOUS
  Filled 2023-12-21: qty 3.75

## 2023-12-21 MED ORDER — SODIUM CHLORIDE 0.9% FLUSH: 10.0000 mL | INTRAVENOUS | Status: DC | PRN

## 2023-12-21 NOTE — Patient Instructions (Signed)
 Fam-Trastuzumab  Deruxtecan Injection What is this medication? FAM-TRASTUZUMAB  DERUXTECAN (fam-tras TOOZ eu mab DER ux TEE kan) treats some types of cancer. It works by blocking a protein that causes cancer cells to grow and multiply. This helps to slow or stop the spread of cancer cells. This medicine may be used for other purposes; ask your health care provider or pharmacist if you have questions. COMMON BRAND NAME(S): ENHERTU  What should I tell my care team before I take this medication? They need to know if you have any of these conditions: Heart disease Heart failure Infection, especially a viral infection, such as chickenpox, cold sores, or herpes Liver disease Lung or breathing disease, such as asthma or COPD An unusual or allergic reaction to fam-trastuzumab  deruxtecan, other medications, foods, dyes, or preservatives Pregnant or trying to get pregnant Breast-feeding How should I use this medication? This medication is injected into a vein. It is given by your care team in a hospital or clinic setting. A special MedGuide will be given to you before each treatment. Be sure to read this information carefully each time. Talk to your care team about the use of this medication in children. Special care may be needed. Overdosage: If you think you have taken too much of this medicine contact a poison control center or emergency room at once. NOTE: This medicine is only for you. Do not share this medicine with others. What if I miss a dose? It is important not to miss your dose. Call your care team if you are unable to keep an appointment. What may interact with this medication? Interactions are not expected. This list may not describe all possible interactions. Give your health care provider a list of all the medicines, herbs, non-prescription drugs, or dietary supplements you use. Also tell them if you smoke, drink alcohol , or use illegal drugs. Some items may interact with your  medicine. What should I watch for while using this medication? Visit your care team for regular checks on your progress. Tell your care team if your symptoms do not start to get better or if they get worse. This medication may increase your risk of getting an infection. Call your care team for advice if you get a fever, chills, sore throat, or other symptoms of a cold or flu. Do not treat yourself. Try to avoid being around people who are sick. Avoid taking medications that contain aspirin, acetaminophen , ibuprofen, naproxen, or ketoprofen unless instructed by your care team. These medications may hide a fever. Be careful brushing or flossing your teeth or using a toothpick because you may get an infection or bleed more easily. If you have any dental work done, tell your dentist you are receiving this medication. This medication may cause dry eyes and blurred vision. If you wear contact lenses, you may feel some discomfort. Lubricating eye drops may help. See your care team if the problem does not go away or is severe. Talk to your care team if you may be pregnant. Serious birth defects can occur if you take this medication during pregnancy and for 7 months after the last dose. If your partner can get pregnant, use a condom during sex while taking this medication and for 4 months after the last dose. Do not breastfeed while taking this medication and for 7 months after the last dose. This medication may cause infertility. Talk to your care team if you are concerned about your fertility. What side effects may I notice from receiving this medication? Side effects  that you should report to your care team as soon as possible: Allergic reactions--skin rash, itching, hives, swelling of the face, lips, tongue, or throat Dry cough, shortness of breath or trouble breathing Infection--fever, chills, cough, sore throat, wounds that don't heal, pain or trouble when passing urine, general feeling of discomfort or  being unwell Heart failure--shortness of breath, swelling of the ankles, feet, or hands, sudden weight gain, unusual weakness or fatigue Unusual bruising or bleeding Side effects that usually do not require medical attention (report these to your care team if they continue or are bothersome): Constipation Diarrhea Hair loss Muscle pain Nausea Vomiting This list may not describe all possible side effects. Call your doctor for medical advice about side effects. You may report side effects to FDA at 1-800-FDA-1088. Where should I keep my medication? This medication is given in a hospital or clinic. It will not be stored at home. NOTE: This sheet is a summary. It may not cover all possible information. If you have questions about this medicine, talk to your doctor, pharmacist, or health care provider.  2024 Elsevier/Gold Standard (2022-12-24 00:00:00)

## 2023-12-22 ENCOUNTER — Other Ambulatory Visit: Payer: Self-pay

## 2023-12-29 ENCOUNTER — Encounter: Payer: Self-pay | Admitting: Oncology

## 2024-01-11 ENCOUNTER — Inpatient Hospital Stay

## 2024-01-11 ENCOUNTER — Inpatient Hospital Stay: Attending: Hematology and Oncology

## 2024-01-11 ENCOUNTER — Other Ambulatory Visit: Payer: Self-pay

## 2024-01-11 ENCOUNTER — Inpatient Hospital Stay (HOSPITAL_BASED_OUTPATIENT_CLINIC_OR_DEPARTMENT_OTHER): Admitting: Hematology and Oncology

## 2024-01-11 VITALS — BP 177/80 | HR 75 | Temp 97.9°F | Resp 16 | Ht 60.25 in | Wt 108.9 lb

## 2024-01-11 DIAGNOSIS — Z171 Estrogen receptor negative status [ER-]: Secondary | ICD-10-CM | POA: Insufficient documentation

## 2024-01-11 DIAGNOSIS — C787 Secondary malignant neoplasm of liver and intrahepatic bile duct: Secondary | ICD-10-CM

## 2024-01-11 DIAGNOSIS — I972 Postmastectomy lymphedema syndrome: Secondary | ICD-10-CM | POA: Diagnosis not present

## 2024-01-11 DIAGNOSIS — K760 Fatty (change of) liver, not elsewhere classified: Secondary | ICD-10-CM | POA: Diagnosis not present

## 2024-01-11 DIAGNOSIS — G629 Polyneuropathy, unspecified: Secondary | ICD-10-CM | POA: Diagnosis not present

## 2024-01-11 DIAGNOSIS — Z85038 Personal history of other malignant neoplasm of large intestine: Secondary | ICD-10-CM | POA: Insufficient documentation

## 2024-01-11 DIAGNOSIS — Z1722 Progesterone receptor negative status: Secondary | ICD-10-CM | POA: Diagnosis not present

## 2024-01-11 DIAGNOSIS — C50412 Malignant neoplasm of upper-outer quadrant of left female breast: Secondary | ICD-10-CM | POA: Insufficient documentation

## 2024-01-11 DIAGNOSIS — C7951 Secondary malignant neoplasm of bone: Secondary | ICD-10-CM

## 2024-01-11 DIAGNOSIS — S2231XD Fracture of one rib, right side, subsequent encounter for fracture with routine healing: Secondary | ICD-10-CM | POA: Diagnosis not present

## 2024-01-11 DIAGNOSIS — Z5112 Encounter for antineoplastic immunotherapy: Secondary | ICD-10-CM | POA: Insufficient documentation

## 2024-01-11 DIAGNOSIS — H04209 Unspecified epiphora, unspecified lacrimal gland: Secondary | ICD-10-CM | POA: Insufficient documentation

## 2024-01-11 DIAGNOSIS — E278 Other specified disorders of adrenal gland: Secondary | ICD-10-CM | POA: Diagnosis not present

## 2024-01-11 DIAGNOSIS — Z800 Family history of malignant neoplasm of digestive organs: Secondary | ICD-10-CM | POA: Insufficient documentation

## 2024-01-11 DIAGNOSIS — M6250 Muscle wasting and atrophy, not elsewhere classified, unspecified site: Secondary | ICD-10-CM | POA: Insufficient documentation

## 2024-01-11 DIAGNOSIS — Z9012 Acquired absence of left breast and nipple: Secondary | ICD-10-CM | POA: Insufficient documentation

## 2024-01-11 DIAGNOSIS — Z79899 Other long term (current) drug therapy: Secondary | ICD-10-CM | POA: Diagnosis not present

## 2024-01-11 LAB — CBC WITH DIFFERENTIAL (CANCER CENTER ONLY)
Abs Immature Granulocytes: 0.01 K/uL (ref 0.00–0.07)
Basophils Absolute: 0 K/uL (ref 0.0–0.1)
Basophils Relative: 1 %
Eosinophils Absolute: 0.1 K/uL (ref 0.0–0.5)
Eosinophils Relative: 2 %
HCT: 37.3 % (ref 36.0–46.0)
Hemoglobin: 12.8 g/dL (ref 12.0–15.0)
Immature Granulocytes: 0 %
Lymphocytes Relative: 34 %
Lymphs Abs: 1.5 K/uL (ref 0.7–4.0)
MCH: 35.4 pg — ABNORMAL HIGH (ref 26.0–34.0)
MCHC: 34.3 g/dL (ref 30.0–36.0)
MCV: 103 fL — ABNORMAL HIGH (ref 80.0–100.0)
Monocytes Absolute: 0.5 K/uL (ref 0.1–1.0)
Monocytes Relative: 12 %
Neutro Abs: 2.3 K/uL (ref 1.7–7.7)
Neutrophils Relative %: 51 %
Platelet Count: 163 K/uL (ref 150–400)
RBC: 3.62 MIL/uL — ABNORMAL LOW (ref 3.87–5.11)
RDW: 14 % (ref 11.5–15.5)
WBC Count: 4.4 K/uL (ref 4.0–10.5)
nRBC: 0 % (ref 0.0–0.2)

## 2024-01-11 LAB — CMP (CANCER CENTER ONLY)
ALT: 19 U/L (ref 0–44)
AST: 39 U/L (ref 15–41)
Albumin: 4.3 g/dL (ref 3.5–5.0)
Alkaline Phosphatase: 65 U/L (ref 38–126)
Anion gap: 10 (ref 5–15)
BUN: 15 mg/dL (ref 8–23)
CO2: 23 mmol/L (ref 22–32)
Calcium: 10.1 mg/dL (ref 8.9–10.3)
Chloride: 105 mmol/L (ref 98–111)
Creatinine: 0.74 mg/dL (ref 0.44–1.00)
GFR, Estimated: >60 mL/min (ref >60)
Glucose, Bld: 97 mg/dL (ref 70–99)
Potassium: 4.4 mmol/L (ref 3.5–5.1)
Sodium: 138 mmol/L (ref 135–145)
Total Bilirubin: 0.6 mg/dL (ref 0.0–1.2)
Total Protein: 7 g/dL (ref 6.5–8.1)

## 2024-01-11 MED ORDER — SODIUM CHLORIDE 0.9% FLUSH: 10.0000 mL | INTRAVENOUS | Status: DC | PRN

## 2024-01-11 MED ORDER — DEXAMETHASONE SODIUM PHOSPHATE 10 MG/ML IJ SOLN
10.0000 mg | Freq: Once | INTRAMUSCULAR | Status: AC
  Administered 2024-01-11: 10 mg via INTRAVENOUS
  Filled 2024-01-11: qty 1

## 2024-01-11 MED ORDER — PALONOSETRON HCL INJECTION 0.25 MG/5ML
0.2500 mg | Freq: Once | INTRAVENOUS | Status: AC
  Administered 2024-01-11: 0.25 mg via INTRAVENOUS
  Filled 2024-01-11: qty 5

## 2024-01-11 MED ORDER — DEXTROSE 5 % IV SOLN: Freq: Once | INTRAVENOUS | Status: AC

## 2024-01-11 MED ORDER — DIPHENHYDRAMINE HCL 25 MG PO CAPS
25.0000 mg | ORAL_CAPSULE | Freq: Once | ORAL | Status: AC
  Administered 2024-01-11: 25 mg via ORAL
  Filled 2024-01-11: qty 1

## 2024-01-11 MED ORDER — ACETAMINOPHEN 325 MG PO TABS
650.0000 mg | ORAL_TABLET | Freq: Once | ORAL | Status: AC
  Administered 2024-01-11: 650 mg via ORAL
  Filled 2024-01-11: qty 2

## 2024-01-11 MED ORDER — FAM-TRASTUZUMAB DERUXTECAN-NXKI CHEMO 100 MG IV SOLR
4.0500 mg/kg | Freq: Once | INTRAVENOUS | Status: AC
  Administered 2024-01-11: 200 mg via INTRAVENOUS
  Filled 2024-01-11: qty 10

## 2024-01-11 NOTE — Progress Notes (Signed)
 [facility]  [address] [place],  [place]  [address] [phone]  Clinic Day:  12/21/23  Referring physician: [name], MD  ASSESSMENT & PLAN:  Assessment: Malignant neoplasm of upper-outer quadrant of left breast in female, estrogen receptor negative ([name]) History of stage IIB (T2c N1 M0) HER2 receptor positive, ER/PR negative, left breast cancer diagnosed in June 2020.  She was treated with neoadjuvant Kadcyla /Perjeta  followed by left mastectomy.  She had a complete response in breast and nodes. She had only received two doses of adjuvant trastuzumab  before discontinuing in December 2020. She developed recurrent disease in 2022.   Malignant neoplasm metastatic to liver [facility]) Numerous heterogeneously enhancing, internally necrotic lesions in the right hepatic lobe CT in December 2023, which was consistent with metastatic disease. Largest lesion measures up to 6.7 cm and there are at least 6 lesions.  Liver biopsy was consistent with metastatic breast cancer with negative estrogen and progesterone receptors and positive HER2 receptor. She was initially treated with trastuzumab  with an excellent response with significant decrease in the size of her liver lesions and sustained response. She later had progression of this disease in the chest wall and was switched to Enhertu .  CT chest, abdomen, and pelvis in March revealed stable subtle liver lesions with at least 1 lesion is no longer visualized. CT chest, abdomen and pelvis in October 2024 revealed a decrease in the hepatic lesions with no new lesions seen.  CT imaging in April was fairly stable except a slight increase in the left adrenal nodule. There was a stable vague liver lesion and stable bone metatasis. She continues Enhertu  every 3 weeks without significant difficulty.     Malignant neoplasm metastatic to bone [facility]) Bone metastases diagnosed in December 2022, for which she was placed on zoledronic  acid every 4 weeks.  She is now receiving Zometa  every 6 weeks to correspond with her chemotherapy. CT imaging in April 2025 did not reveal any evidence of progressive disease.  Chest wall recurrence of breast cancer, left ([name]) Left chest wall recurrence diagnosed in January 2023.  Biopsy revealed carcinoma consistent with her previous breast cancer with negative estrogen and progesterone receptors and positive HER2 receptor.  She was initially treated with docetaxel /trastuzumab  and received 10 cycles with a good response.  She then had recurrence in the left chest wall in September 2023, so was switched to Enhertu  good response.  There is no recurrence in the left chest wall.   Polyneuropathy She has had chronic left hand pain, initially thought to be related to lymphedema.  Her lymphedema was controlled. She has decreased strength and muscle wasting.  She was referred to neurology for further evaluation.  EMG revealed polyneuropathy.  She has now seen orthopedics and had an MRI of the left brachial plexus that was unremarkable, but limited due to patient motion.  She had been on gabapentin  100 mg at bedtime, but discontinued that on her own. She saw Dr. [name] in neurology in October 2024 and underwent repeat EMG, which was more abnormal. She has been seen by Dr. [name] with [name] in [place], who did not feel surgery was a good option. The patient no longer sees occupational therapy, but does exercises at home.  Plan: She will proceed with treatment. We will plan to repeat this in October. I will see her back in 3 weeks with CBC and CMP. The patient understands the plans discussed today and is in agreement with them.  She knows to contact our  office if she develops concerns prior to her next visit.   I provided 20 minutes of face-to-face time during this encounter and > 50% was spent counseling as documented under my assessment and plan.   Jasmin Bach, FNP- [facility] -  A DEPT OF [facility]. [facility] [address] [place] [address] Dept: [phone] Dept Fax: [phone]   [name].  CHIEF COMPLAINT:  CC: Recurrent HER2 receptor positive breast cancer  Current Treatment: Enhertu  every 3 weeks  HISTORY OF PRESENT ILLNESS:  Jasmin Lewis is a 89 year old woman with recurrent HER2 receptor positive. She was diagnosed with stage IIB (T2 N1 M0) HER2 receptor positive and hormone receptor negative left breast cancer in June 2020 when she was able to palpate a mass of the left breast. Mammogram and ultrasound in May 2020 revealed a 2.9 cm irregular mass in the upper outer left breast as well as two adjacent abnormal left axillary lymph nodes. Biopsy was obtained on June 3rd and revealed invasive ductal carcinoma and DCIS with lymphovascular invasion. Left axillary lymph node was positive for invasive ductal carcinoma. HER2 was positive 3+. Estrogen and progesterone receptors were both negative at 0%, and Ki67 was 70%. Breast MRI from July revealed a 3.3 cm mass of the lateral portion of the left breast as well as significant non mass enhancement surrounding this mass and extending anteriorly into the nipple base, with largest diameter in the anterior to posterior axis, measuring 7.2 centimeters. Three enlarged left axillary lymph nodes. Right breast was negative.  PET imaging in July revealed no evidence of additional metastatic involvement. She received neoadjuvant trastuzumab /pertuzumab  for 6 cycles. Repeat breast MRI in October revealed complete resolution of the enhancing mass and associated non mass enhancement within the left breast. Previously identified left axillary lymphadenopathy was no longer identified. She underwent left mastectomy and targeted lymph node dissection in November 2020.  Pathology revealed evidence of malignancy within the breast and three sentinel lymph nodes were negative  for malignancy (0/3). She continued maintenance trastuzumab  injections for two more doses, but this was discontinued in December 2020 as she refused further ECHO scans.    Of note, she also has a history of stage II colon cancer, diagnosed in May 2007. Preop CEA was 5.2. She underwent surgical resection and surgical pathology revealed a 5.9 x 3.8 x 1.2 cm moderate to well-differentiated adenocarcinoma penetrating the muscular wall. Forty lymph nodes negative. No vascular or lymphatic invasion. Multiple diverticula with microabscess formation and inflammation. Carcinoma present in at least 1 diverticulum. She received adjuvant chemotherapy with capecitabine for 9 months. Colonoscopy from August 2013 revealed 2 polyps which were resected and chronic diverticulosis.  In December 2022, she was evaluated at [facility] ED for abdominal and back pain.  CT abdomen pelvis revealed new lytic lesion involving the L2 vertebral body concerning for metastatic disease, as well as multiple hepatic rim-enhancing fluid collections with significant surrounding edema and an additional subtle 1.5 cm hypodensity in the hepatic dome. Given clinical history and presence of a new L2 lesion, leading differential consideration is metastatic disease. MRI abdomen in revealed numerous heterogeneously enhancing, internally necrotic lesions in the right hepatic lobe, highly concerning for metastatic disease. Largest lesion measures up to 6.7 cm in hepatic segment VI. Enhancing osseous lesions in the L2 vertebral body, both iliac bones, and in the sacrum, highly concerning for osseous metastases was also seen.  PET scan revealed widespread hypermetabolic disease in the left chest wall, left axillary, subpectoral and supraclavicular nodes, liver, left adrenal and bone.  MRI thoracic/lumbar spine revealed widespread osseous metastasis with the largest deposit replacing L2 vertebral body where there was a compression fracture with mild height  loss and early epidural tumor extension affecting L2-3 foramina.  MRI brain was negative for metastatic disease.  Biopsy of of the left chest wall  was consistent with recurrent ductal carcinoma of the breast.  HER2 was positive, estrogen and progesterone receptors were negative.  She was initially treated with trastuzumab , as well as zoledronic  acid, beginning in January 2023 with an initial response.   Oncology History Overview Note  Cancer Staging Malignant neoplasm of upper-outer quadrant of left breast in female, estrogen receptor negative ([name]) Staging form: Breast, AJCC 8th Edition - Clinical stage from 10/11/2018: Stage IIB (cT2, cN1, cM0, G3, ER-, PR-, HER2+) - Signed by Lanny Callander, MD on 11/02/2018 - Clinical: No stage assigned - Unsigned    History of colon cancer, stage II  09/2005 Initial Diagnosis   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 Findings were a 5.9 x 3.8 x 1.2 cm moderate to well-differentiated adenocarcinoma penetrating the muscular wall. Forty lymph nodes negative. No vascular or lymphatic invasion. Multiple diverticula with microabscess formation and inflammation. Carcinoma present in at least 1 diverticulum. Preop CEA 5.2 with lab normal range 0 to 2.5. Preop CT scan with no obvious additional pathology.    2007 -  Chemotherapy   She received oral Xeloda chemotherapy as an adjuvant. She declined treatment on a clinical trial.    11/12/2011 Initial Diagnosis   [name], stage II   12/09/2011 Procedure   Followup colonoscopy done on 12/09/2011. She was found to have 2 polyps which were removed. Chronic diverticulosis.     Malignant neoplasm of upper-outer quadrant of left breast in female, estrogen receptor negative ([name])  09/26/2018 Mammogram   Mammogram/US  of left breast 09/26/18 IMPRESSION:  1. 2.9cm irregular mass in the upper outer left breast corresponds to  the palpable abnormality. This is highly suspicious for breast carcinoma.  2. Two adjacent abnormal left axillary  Lymph nodes suspicious for metastatic adenopathy. There is a third borderline abnormal left axillary LN with a cortex thickened to 4mm.  3. Benign right breast cyst. No evidence of right breast malignancy.    10/11/2018 Cancer Staging   Staging form: Breast, AJCC 8th Edition - Clinical stage from 10/11/2018: Stage IIB (cT2, cN1, cM0, G3, ER-, PR-, HER2+) - Signed by Lanny Callander, MD on 11/02/2018   10/11/2018 Initial Biopsy   Diagnosis 10/11/18 1. Breast, left, needle core biopsy, upper outer left 2 o'clock - INVASIVE DUCTAL CARCINOMA. - DUCTAL CARCINOMA IN SITU. - LYMPHOVASCULAR INVASION IS IDENTIFIED. - SEE COMMENT. 2. Lymph node, needle/core biopsy, left axilla - INVASIVE DUCTAL CARCINOMA. - SEE COMMENT.   10/11/2018 Receptors her2   The tumor cells are POSITIVE for Her2 (3+). Estrogen Receptor: 0%, NEGATIVE Progesterone Receptor: 0%, NEGATIVE Proliferation Marker Ki67: 70%   11/02/2018 Initial Diagnosis   Malignant neoplasm of upper-outer quadrant of left breast in female, estrogen receptor negative ([name])   11/15/2018 Breast MRI   MRI breast 11/15/18  IMPRESSION: 1. 3.3 centimeter mass in the LATERAL portion of the LEFT breast consistent with known malignancy. 2. There is significant non mass enhancement surrounding this mass and extending anteriorly into the nipple  base, with largest diameter in the anterior to posterior axis, measuring 7.2 centimeters. 3. If the patient would consider breast conservation, additional MR guided core biopsies are recommended. Consider biopsy of the inferior and anterior extent of the non mass enhancement to document extent of disease. 4. Three enlarged LEFT axillary lymph nodes. 5. RIGHT breast is negative.   11/15/2018 PET scan   PET 11/15/18 IMPRESSION: Hypermetabolic left breast lesion compatible with known primary. Hypermetabolic left  axillary lymph nodes are consistent with metastatic disease.   No evidence for additional hypermetabolic metastatic involvement in the neck, chest, abdomen, or pelvis.   11/17/2018 - 03/01/2019 Chemotherapy   Neo-adjuvant Kadcyla  and perjeta  q3weeks for 6 cycles starting 11/17/18. Stopped before surgery    11/29/2018 Pathology Results   Diagnosis 1. Breast, left, needle core biopsy, inferior anterior (cylinder clip) - INVASIVE DUCTAL CARCINOMA. - DUCTAL CARCINOMA IN SITU. - LYMPHOVASCULAR INVASION IS IDENTIFIED. - SEE COMMENT. 2. Breast, left, needle core biopsy, central posterior (barbell clip) - INVASIVE DUCTAL CARCINOMA. - LYMPHOVASCULAR INVASION IS IDENTIFIED.   02/12/2019 Breast MRI   IMPRESSION: 1. Complete resolution of previously identified enhancing mass and associated non mass enhancement within the left breast. This is consistent with excellent response to chemotherapy. No residual or suspicious findings are identified. 2. No MRI evidence of malignancy on the right. 3. Previously identified left axillary lymphadenopathy not definitively seen on today's study. However, this may be due to decreased field-of-view compared to prior study.     03/14/2019 Cancer Staging   Staging form: Breast, AJCC 8th Edition - Pathologic stage from 03/14/2019: pT0, pN0, cM0, GX, ER: Unknown, PR: Unknown, HER2: Not Assessed - Signed by Lanny Callander, MD on 03/28/2019   03/14/2019 Surgery   LEFT MASTECTOMY WITH TARGETED LYMPH NODE DISSECTION and Left Axillary Sentinel Lymph  Node Biopsy by Dr Curvin 03/14/19    03/14/2019 Pathology Results   FINAL MICROSCOPIC DIAGNOSIS:   A. LYMPH NODE, LEFT, SENTINEL, BIOPSY:  - There is no evidence of carcinoma in 1 of 1 lymph node (0/1).   B. BREAST, LEFT, MASTECTOMY:  - Benign breast parenchyma with treatment-related changes.  - There is no evidence of malignancy.  - See oncology table below.   C. LYMPH NODE, LEFT #1, SENTINEL, BIOPSY:  - There is no  evidence of carcinoma in 1 of 1 lymph node (0/1).   D. LYMPH NODE, LEFT #2, SENTINEL, BIOPSY:  - There is no evidence of carcinoma in 1 of 1 lymph node (0/1).     04/04/2019 - 04/25/2019 Chemotherapy   Maintenance Herceptin  injections every 3 weeks starting 04/03/19 to complete 1 year of treatment that was started in 11/2018. Stopped after 2nd dose as she will not be repeating Echos.    06/03/2021 - 06/24/2021 Chemotherapy   Patient is on Treatment Plan : BREAST Trastuzumab  q21d X 11 Cycles     06/25/2021 - 01/01/2022 Chemotherapy   Patient is on Treatment Plan : BREAST Docetaxel  + Trastuzumab  + Pertuzumab  (THP) q21d     06/25/2021 - 01/02/2022 Chemotherapy   Patient is on Treatment Plan : BREAST Docetaxel  + Trastuzumab  + Pertuzumab  (THP) q21d     09/14/2021 Imaging   CT chest/abdomen/pelvis  IMPRESSION:  1. Interval resolution of previously seen bulky left axillary and  subpectoral lymphadenopathy. No persistently enlarged lymph nodes.  2. Multiple liver lesions are significantly diminished in size.  3. Widespread osseous metastatic disease, with an interval increase  in sclerosis of several previously lytic lesions.  4. Constellation of findings  is consistent with treatment response  of metastatic disease  5. New, although age indeterminate pathologic wedge deformity of the  T6 vertebral body as well as an increased pathologic wedge deformity  of the L2 vertebral body.  6. Coronary artery disease.  Aortic Atherosclerosis (ICD10-I70.0).    01/26/2022 -  Chemotherapy   Patient is on Treatment Plan : BREAST METASTATIC Fam-Trastuzumab Deruxtecan-nxki  (Enhertu ) (5.4) q21d     Malignant neoplasm metastatic to liver ([name])  04/17/2021 Imaging   CT ABDOMEN AND PELVIS WITH CONTRAST: -New lytic lesion involving the L2 vertebral body (6:72, 2:26) with minimal cortical breakthrough superiorly, but with preservation of vertebral body height, concerning for metastatic disease.  -There are several  indeterminate hepatic lesions as described above. In addition to the lesions described above, there is an additional subtle 1.5 cm hypodensity in the hepatic dome (2:7). Given clinical history and presence of a new L2 lesion, leading differential consideration is metastatic disease. Recommend further evaluation with MRI of the abdomen with and without contrast.   05/12/2021 Imaging   MRI ABDOMEN WITH AND WITHOUT CONTRAST: Numerous heterogeneously enhancing, internally necrotic lesions in the right hepatic lobe, highly concerning for metastatic disease. Largest lesion measures up to 6.7 cm in hepatic segment VI.   Enhancing osseous lesions in the L2 vertebral body, both iliac bones, and in the sacrum, highly concerning for osseous metastases.   05/15/2021 Initial Diagnosis   Liver metastases ([name])   05/27/2021 PET scan   1. Widespread recurrent/metastatic disease in this patient who is  status post bilateral mastectomy. Left chest wall recurrence with  left axillary/subpectoral, supraclavicular nodal metastasis.  2. Hepatic, left adrenal, and widespread osseous metastasis.  3. Incidental findings, including: Right nephrolithiasis. Tiny  hiatal hernia.    05/27/2021 Imaging   MRI LUMBAR AND THORACIC SPINE: Thoracic spine:  Osseous metastatic disease involving each level. The most dramatic deposits are at T4 and T6 where there is extraosseous/epidural tumor. No cord compression but there is foraminal impingement on the left at T6-7 and on the right at T3-4. Early dorsal epidural tumor at the level of T10 and T3.   Lumbar spine:  1. Widespread osseous metastatic disease with largest deposit  replacing the L2 body where there is a compression fracture with  mild height loss. Also at this level is early epidural tumor  extension likely affecting both L2-3 foramina.  2. 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    05/27/2021 Imaging   MRI HEAD WITH AND WITHOUT  CONTRAST: Negative for metastatic disease to the brain or calvarium.   06/03/2021 - 06/24/2021 Chemotherapy   Patient is on Treatment Plan : BREAST Trastuzumab  q21d X 11 Cycles     06/25/2021 - 01/01/2022 Chemotherapy   Patient is on Treatment Plan : BREAST Docetaxel  + Trastuzumab  + Pertuzumab  (THP) q21d     06/25/2021 - 01/02/2022 Chemotherapy   Patient is on Treatment Plan : BREAST Docetaxel  + Trastuzumab  + Pertuzumab  (THP) q21d     09/14/2021 Imaging   CT chest/abdomen/pelvis  IMPRESSION:  1. Interval resolution of previously seen bulky left axillary and  subpectoral lymphadenopathy. No persistently enlarged lymph nodes.  2. Multiple liver lesions are significantly diminished in size.  3. Widespread osseous metastatic disease, with an interval increase  in sclerosis of several previously lytic lesions.  4. Constellation of findings is consistent with treatment response  of metastatic disease  5. New, although age indeterminate pathologic wedge deformity of the  T6 vertebral body as well as  an increased pathologic wedge deformity  of the L2 vertebral body.  6. Coronary artery disease.  Aortic Atherosclerosis (ICD10-I70.0).    01/26/2022 -  Chemotherapy   Patient is on Treatment Plan : BREAST METASTATIC Fam-Trastuzumab Deruxtecan-nxki  (Enhertu ) (5.4) q21d     Malignant neoplasm metastatic to bone ([name])  04/17/2021 Imaging   CT ABDOMEN AND PELVIS WITH CONTRAST: -New lytic lesion involving the L2 vertebral body (6:72, 2:26) with minimal cortical breakthrough superiorly, but with preservation of vertebral body height, concerning for metastatic disease.  -There are several indeterminate hepatic lesions as described above. In addition to the lesions described above, there is an additional subtle 1.5 cm hypodensity in the hepatic dome (2:7). Given clinical history and presence of a new L2 lesion, leading differential consideration is metastatic disease. Recommend further evaluation with MRI of  the abdomen with and without contrast.   05/12/2021 Imaging   MRI ABDOMEN WITH AND WITHOUT CONTRAST: Numerous heterogeneously enhancing, internally necrotic lesions in the right hepatic lobe, highly concerning for metastatic disease. Largest lesion measures up to 6.7 cm in hepatic segment VI.   Enhancing osseous lesions in the L2 vertebral body, both iliac bones, and in the sacrum, highly concerning for osseous metastases.   05/15/2021 Initial Diagnosis   Bone metastases ([name])   05/27/2021 PET scan   1. Widespread recurrent/metastatic disease in this patient who is  status post bilateral mastectomy. Left chest wall recurrence with  left axillary/subpectoral, supraclavicular nodal metastasis.  2. Hepatic, left adrenal, and widespread osseous metastasis.  3. Incidental findings, including: Right nephrolithiasis. Tiny  hiatal hernia.    05/27/2021 Imaging   MRI LUMBAR AND THORACIC SPINE: Thoracic spine:  Osseous metastatic disease involving each level. The most dramatic deposits are at T4 and T6 where there is extraosseous/epidural tumor. No cord compression but there is foraminal impingement on the left at T6-7 and on the right at T3-4. Early dorsal epidural tumor at the level of T10 and T3.   Lumbar spine:  1. Widespread osseous metastatic disease with largest deposit  replacing the L2 body where there is a compression fracture with  mild height loss. Also at this level is early epidural tumor  extension likely affecting both L2-3 foramina.  2. 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    05/27/2021 Imaging   MRI HEAD WITH AND WITHOUT CONTRAST: Negative for metastatic disease to the brain or calvarium.   06/03/2021 - 06/24/2021 Chemotherapy   Patient is on Treatment Plan : BREAST Trastuzumab  q21d X 11 Cycles     06/25/2021 - 01/01/2022 Chemotherapy   Patient is on Treatment Plan : BREAST Docetaxel  + Trastuzumab  + Pertuzumab  (THP) q21d     06/25/2021 -  01/02/2022 Chemotherapy   Patient is on Treatment Plan : BREAST Docetaxel  + Trastuzumab  + Pertuzumab  (THP) q21d     09/14/2021 Imaging   CT chest/abdomen/pelvis  IMPRESSION:  1. Interval resolution of previously seen bulky left axillary and  subpectoral lymphadenopathy. No persistently enlarged lymph nodes.  2. Multiple liver lesions are significantly diminished in size.  3. Widespread osseous metastatic disease, with an interval increase  in sclerosis of several previously lytic lesions.  4. Constellation of findings is consistent with treatment response  of metastatic disease  5. New, although age indeterminate pathologic wedge deformity of the  T6 vertebral body as well as an increased pathologic wedge deformity  of the L2 vertebral body.  6. Coronary artery disease.  Aortic Atherosclerosis (ICD10-I70.0).    01/26/2022 -  Chemotherapy  Patient is on Treatment Plan : BREAST METASTATIC Fam-Trastuzumab Deruxtecan-nxki  (Enhertu ) (5.4) q21d         INTERVAL HISTORY:  Jasmin Lewis is here today for repeat clinical assessment for her recurrent HER2 receptor positive breast cancer. Patient states that she feels not the best and complains of tearing of the eyes and left hip pain. She informed me that she was told about a procedure where they can place stents in her eyes to help with her excessive tearing. She states that she has to make an appointment with her ophthalmologist. Her day 1 cycle 34 of Enhertu  is scheduled on 12/21/2023. She has a WBC of 4.8, hemoglobin of 12.8, and platelet count of 159,000. Her CMP is normal other than a elevated calcium of 10.5 and AST of 47. Her last CT scan was in April which revealed multifocal sclerotic bone metastases which are similar to the previous and  stable areas of compression deformity at L2 and T6. The left adrenal nodule appears slightly larger but there is no developing new lymph node enlargement or soft tissue mass, fatty liver infiltration with slightly  nodular contour, and the vague low-attenuation liver lesion on previous is not well seen. We will plan to repeat this in October. I will see her back in 3 weeks with CBC and CMP. She denies fever, chills, night sweats, or other signs of infection. She denies cardiorespiratory and gastrointestinal issues. She  denies pain. Her appetite is good and Her weight has increased 1 pounds over last 3 weeks.   REVIEW OF SYSTEMS:   Review of Systems  Constitutional: Negative.  Negative for appetite change, chills, fatigue, fever and unexpected weight change.  HENT:  Negative.  Negative for lump/mass, mouth sores and sore throat.   Eyes:  Positive for eye problems (excessive tearing).  Respiratory: Negative.  Negative for chest tightness, cough, hemoptysis, shortness of breath and wheezing.   Cardiovascular: Negative.  Negative for chest pain, leg swelling and palpitations.  Gastrointestinal: Negative.  Negative for abdominal distention, abdominal pain, blood in stool, constipation, diarrhea, nausea and vomiting.  Endocrine: Negative.  Negative for hot flashes.  Genitourinary: Negative.  Negative for difficulty urinating, dysuria, frequency and hematuria.   Musculoskeletal:  Positive for arthralgias and gait problem (uses a cane). Negative for back pain, flank pain and myalgias.  Skin: Negative.  Negative for rash.  Neurological:  Positive for extremity weakness (atrophy and severe left hand weakness) and gait problem (uses a cane). Negative for dizziness, headaches, light-headedness, numbness, seizures and speech difficulty.  Hematological: Negative.  Negative for adenopathy. Does not bruise/bleed easily.  Psychiatric/Behavioral: Negative.  Negative for depression and sleep disturbance. The patient is not nervous/anxious.     VITALS:  There were no vitals taken for this visit.  Wt Readings from Last 3 Encounters:  12/21/23 109 lb 9.6 oz (49.7 kg)  11/30/23 108 lb 11.2 oz (49.3 kg)  11/09/23 110 lb (49.9  kg)    There is no height or weight on file to calculate BMI.  Performance status (ECOG): 1 - Symptomatic but completely ambulatory  PHYSICAL EXAM:   Physical Exam Vitals and nursing note reviewed.  Constitutional:      General: She is not in acute distress.    Appearance: Normal appearance. She is normal weight. She is not ill-appearing, toxic-appearing or diaphoretic.  HENT:     Head: Normocephalic and atraumatic.     Right Ear: Tympanic membrane, ear canal and external ear normal. There is no impacted cerumen.  Left Ear: Tympanic membrane, ear canal and external ear normal. There is no impacted cerumen.     Nose: Nose normal. No congestion or rhinorrhea.     Mouth/Throat:     Mouth: Mucous membranes are moist.     Pharynx: Oropharynx is clear. No oropharyngeal exudate or posterior oropharyngeal erythema.  Eyes:     General: No scleral icterus.       Right eye: No discharge.        Left eye: No discharge.     Extraocular Movements: Extraocular movements intact.     Conjunctiva/sclera: Conjunctivae normal.     Pupils: Pupils are equal, round, and reactive to light.     Comments: Excessive tearing  Neck:     Vascular: No carotid bruit.  Cardiovascular:     Rate and Rhythm: Normal rate and regular rhythm.     Pulses: Normal pulses.     Heart sounds: Normal heart sounds. No murmur heard.    No friction rub. No gallop.  Pulmonary:     Effort: Pulmonary effort is normal. No respiratory distress.     Breath sounds: Normal breath sounds. No stridor. No wheezing, rhonchi or rales.  Chest:     Chest wall: No tenderness.  Breasts:    Right: Normal.     Left: Normal.     Comments: Left chest wall is negative. Right breast is without masses Abdominal:     General: Bowel sounds are normal. There is no distension.     Palpations: Abdomen is soft. There is no hepatomegaly, splenomegaly or mass.     Tenderness: There is no abdominal tenderness. There is no right CVA tenderness,  left CVA tenderness, guarding or rebound.     Hernia: No hernia is present.  Musculoskeletal:        General: No swelling, tenderness, deformity or signs of injury. Normal range of motion.     Right hand: Normal.     Left hand: Decreased strength.     Cervical back: Normal range of motion and neck supple. No rigidity or tenderness.     Right lower leg: No edema.     Left lower leg: No edema.     Comments: Wasting of the left hand  Lymphadenopathy:     Cervical: No cervical adenopathy.     Right cervical: No superficial, deep or posterior cervical adenopathy.    Left cervical: No superficial, deep or posterior cervical adenopathy.     Upper Body:     Right upper body: No supraclavicular, axillary or pectoral adenopathy.     Left upper body: No supraclavicular, axillary or pectoral adenopathy.  Skin:    General: Skin is warm and dry.     Coloration: Skin is not jaundiced or pale.     Findings: No bruising, erythema, lesion or rash.  Neurological:     General: No focal deficit present.     Mental Status: She is alert and oriented to person, place, and time. Mental status is at baseline.     Cranial Nerves: No cranial nerve deficit.     Sensory: No sensory deficit.     Motor: No weakness.     Coordination: Coordination normal.     Gait: Gait normal.     Deep Tendon Reflexes: Reflexes normal.  Psychiatric:        Mood and Affect: Mood normal.        Behavior: Behavior normal.        Thought Content: Thought content normal.  Judgment: Judgment normal.    LABS:      Latest Ref Rng & Units 12/21/2023    1:40 PM 11/30/2023    1:08 PM 11/09/2023    1:04 PM  CBC  WBC 4.0 - 10.5 K/uL 4.8  4.3  4.5   Hemoglobin 12.0 - 15.0 g/dL 87.1  87.4  87.7   Hematocrit 36.0 - 46.0 % 37.0  37.1  37.3   Platelets 150 - 400 K/uL 159  161  136       Latest Ref Rng & Units 12/21/2023    1:40 PM 11/30/2023    1:08 PM 11/09/2023    1:04 PM  CMP  Glucose 70 - 99 mg/dL 89  99  92   BUN 8 - 23  mg/dL 21  18  17    Creatinine 0.44 - 1.00 mg/dL 9.14  9.17  9.22   Sodium 135 - 145 mmol/L 141  140  141   Potassium 3.5 - 5.1 mmol/L 4.4  4.6  4.3   Chloride 98 - 111 mmol/L 107  106  107   CO2 22 - 32 mmol/L 24  25  25    Calcium 8.9 - 10.3 mg/dL 89.4  89.8  9.8   Total Protein 6.5 - 8.1 g/dL 6.8  6.7  6.3   Total Bilirubin 0.0 - 1.2 mg/dL 0.5  0.5  0.4   Alkaline Phos 38 - 126 U/L 62  62  57   AST 15 - 41 U/L 47  39  38   ALT 0 - 44 U/L 22  17  18     Lab Results  Component Value Date   CEA1 1.8 06/03/2021   CEA 0.5 11/13/2012   CEA  Date Value Ref Range Status  06/03/2021 1.8 0.0 - 4.7 ng/mL Final    Comment:    (NOTE)                             Nonsmokers          <3.9                             Smokers             <5.6 [employer] Electrochemiluminescence Immunoassay (ECLIA) Values obtained with different assay methods or kits cannot be used interchangeably.  Results cannot be interpreted as absolute evidence of the presence or absence of malignant disease. Performed At: [facility] [address] [place], [place] [id number] Jennette Shorter MD Ey:[id number]   11/13/2012 0.5 0.0 - 5.0 ng/mL Final   Lab Results  Component Value Date   LDH 186 11/12/2011   LDH 187 09/08/2010   LDH 171 09/09/2009   STUDIES:  EXAM: 08/11/2023 CT CHEST, ABDOMEN, AND PELVIS WITH CONTRAST IMPRESSION: Multifocal sclerotic bone metastases are similar to previous. Stable areas of compression deformity at L2 and T6. Separate healing right lateral rib fracture. Left adrenal nodule appears slightly larger today. Attention on follow-up. No developing new lymph node enlargement, soft tissue mass. Fatty liver infiltration with slightly nodular contour. Vague low-attenuation liver lesion on previous is not well seen today. No new liver lesion at this time.   HISTORY:   Past Medical History:  Diagnosis Date   Arthritis    shoulder   Breast cancer ([name])    Cancer ([name])     left breast ca   Colon cancer ([name])  [name], stage II 11/12/2011   Sigmoid lesion 5.9 cm  40 nodes negative but focus of cancer in a diverticum   Pre-op CEA 5.2 with lab normal up to 2.5 resected 09/21/05   Xeloda adjuvant chemotherapy   Hypertension    Polyneuropathy 02/03/2023   Right shoulder pain 04/12/2023    Past Surgical History:  Procedure Laterality Date   COLONOSCOPY     HEMICOLECTOMY  2007   MASTECTOMY WITH AXILLARY LYMPH NODE DISSECTION Left 03/14/2019   Procedure: LEFT MASTECTOMY WITH TARGETED LYMPH NODE DISSECTION;  Surgeon: Curvin Deward MOULD, MD;  Location: [facility] OR;  Service: General;  Laterality: Left;   SENTINEL NODE BIOPSY Left 03/14/2019   Procedure: Left Axillary Sentinel Lymph  Node Biopsy;  Surgeon: Curvin Deward MOULD, MD;  Location: [facility] OR;  Service: General;  Laterality: Left;   TONSILLECTOMY     WISDOM TOOTH EXTRACTION      Family History  Problem Relation Age of Onset   Cancer Maternal Aunt        colon cancer     Social History:  reports that she has never smoked. She has never used smokeless tobacco. She reports that she does not drink alcohol and does not use drugs.The patient is alone today.  Allergies: No Known Allergies  Current Medications: Current Outpatient Medications  Medication Sig Dispense Refill   Biotin 1 MG CAPS Take by mouth.     bisacodyl (DULCOLAX) 5 MG EC tablet Take 5 mg by mouth daily as needed for moderate constipation. 2 capsules every night     co-enzyme Q-10 30 MG capsule Take 100 mg by mouth daily.     fish oil-omega-3 fatty acids 1000 MG capsule Take 1 g by mouth daily.     gabapentin  (NEURONTIN ) 100 MG capsule Take 1 capsule (100 mg total) by mouth at bedtime. (Patient not taking: Reported on 05/02/2023) 30 capsule 5   MAGNESIUM PO Take 1 tablet by mouth every evening. 600 mg     Multiple Vitamin (MULTIVITAMIN) capsule Take 1 capsule by mouth daily.     ondansetron  (ZOFRAN ) 4 MG tablet Take 1 tablet (4 mg total) by mouth  every 8 (eight) hours as needed for nausea or vomiting. (Patient not taking: Reported on 05/02/2023) 20 tablet 2   polyethylene glycol (MIRALAX / GLYCOLAX) 17 g packet Take 17 g by mouth as needed. constipation     No current facility-administered medications for this visit.

## 2024-01-11 NOTE — Patient Instructions (Signed)
 Fam-Trastuzumab  Deruxtecan Injection What is this medication? FAM-TRASTUZUMAB  DERUXTECAN (fam-tras TOOZ eu mab DER ux TEE kan) treats some types of cancer. It works by blocking a protein that causes cancer cells to grow and multiply. This helps to slow or stop the spread of cancer cells. This medicine may be used for other purposes; ask your health care provider or pharmacist if you have questions. COMMON BRAND NAME(S): ENHERTU  What should I tell my care team before I take this medication? They need to know if you have any of these conditions: Heart disease Heart failure Infection, especially a viral infection, such as chickenpox, cold sores, or herpes Liver disease Lung or breathing disease, such as asthma or COPD An unusual or allergic reaction to fam-trastuzumab  deruxtecan, other medications, foods, dyes, or preservatives Pregnant or trying to get pregnant Breast-feeding How should I use this medication? This medication is injected into a vein. It is given by your care team in a hospital or clinic setting. A special MedGuide will be given to you before each treatment. Be sure to read this information carefully each time. Talk to your care team about the use of this medication in children. Special care may be needed. Overdosage: If you think you have taken too much of this medicine contact a poison control center or emergency room at once. NOTE: This medicine is only for you. Do not share this medicine with others. What if I miss a dose? It is important not to miss your dose. Call your care team if you are unable to keep an appointment. What may interact with this medication? Interactions are not expected. This list may not describe all possible interactions. Give your health care provider a list of all the medicines, herbs, non-prescription drugs, or dietary supplements you use. Also tell them if you smoke, drink alcohol , or use illegal drugs. Some items may interact with your  medicine. What should I watch for while using this medication? Visit your care team for regular checks on your progress. Tell your care team if your symptoms do not start to get better or if they get worse. This medication may increase your risk of getting an infection. Call your care team for advice if you get a fever, chills, sore throat, or other symptoms of a cold or flu. Do not treat yourself. Try to avoid being around people who are sick. Avoid taking medications that contain aspirin, acetaminophen , ibuprofen, naproxen, or ketoprofen unless instructed by your care team. These medications may hide a fever. Be careful brushing or flossing your teeth or using a toothpick because you may get an infection or bleed more easily. If you have any dental work done, tell your dentist you are receiving this medication. This medication may cause dry eyes and blurred vision. If you wear contact lenses, you may feel some discomfort. Lubricating eye drops may help. See your care team if the problem does not go away or is severe. Talk to your care team if you may be pregnant. Serious birth defects can occur if you take this medication during pregnancy and for 7 months after the last dose. If your partner can get pregnant, use a condom during sex while taking this medication and for 4 months after the last dose. Do not breastfeed while taking this medication and for 7 months after the last dose. This medication may cause infertility. Talk to your care team if you are concerned about your fertility. What side effects may I notice from receiving this medication? Side effects  that you should report to your care team as soon as possible: Allergic reactions--skin rash, itching, hives, swelling of the face, lips, tongue, or throat Dry cough, shortness of breath or trouble breathing Infection--fever, chills, cough, sore throat, wounds that don't heal, pain or trouble when passing urine, general feeling of discomfort or  being unwell Heart failure--shortness of breath, swelling of the ankles, feet, or hands, sudden weight gain, unusual weakness or fatigue Unusual bruising or bleeding Side effects that usually do not require medical attention (report these to your care team if they continue or are bothersome): Constipation Diarrhea Hair loss Muscle pain Nausea Vomiting This list may not describe all possible side effects. Call your doctor for medical advice about side effects. You may report side effects to FDA at 1-800-FDA-1088. Where should I keep my medication? This medication is given in a hospital or clinic. It will not be stored at home. NOTE: This sheet is a summary. It may not cover all possible information. If you have questions about this medicine, talk to your doctor, pharmacist, or health care provider.  2024 Elsevier/Gold Standard (2022-12-24 00:00:00)

## 2024-01-12 ENCOUNTER — Other Ambulatory Visit: Payer: Self-pay

## 2024-01-13 ENCOUNTER — Telehealth: Payer: Self-pay

## 2024-01-13 ENCOUNTER — Other Ambulatory Visit: Payer: Self-pay

## 2024-01-13 DIAGNOSIS — C7951 Secondary malignant neoplasm of bone: Secondary | ICD-10-CM

## 2024-01-13 MED ORDER — TRIAMCINOLONE ACETONIDE 0.1 % EX CREA: 1.0000 | TOPICAL_CREAM | Freq: Two times a day (BID) | CUTANEOUS | 1 refills | Status: AC

## 2024-01-13 NOTE — Telephone Encounter (Signed)
 Pt states that Melissa told her to call back if she started breaking out again. Will you have call me in prescription?

## 2024-01-16 ENCOUNTER — Telehealth: Payer: Self-pay

## 2024-01-16 ENCOUNTER — Other Ambulatory Visit: Payer: Self-pay | Admitting: Hematology and Oncology

## 2024-01-16 MED ORDER — METHYLPREDNISOLONE 4 MG PO TBPK: ORAL_TABLET | ORAL | 0 refills | Status: DC

## 2024-01-16 NOTE — Telephone Encounter (Signed)
 Pt called & states, You need to talk to [facility]. She told me if the cream didn't work to let her know. I have the cream but it isn't working much.

## 2024-01-31 ENCOUNTER — Other Ambulatory Visit: Payer: Self-pay

## 2024-02-01 ENCOUNTER — Inpatient Hospital Stay

## 2024-02-01 ENCOUNTER — Inpatient Hospital Stay: Admitting: Oncology

## 2024-02-01 ENCOUNTER — Encounter: Payer: Self-pay | Admitting: Oncology

## 2024-02-01 VITALS — BP 142/80 | HR 77 | Temp 98.3°F | Resp 18 | Ht 60.25 in | Wt 107.1 lb

## 2024-02-01 DIAGNOSIS — C787 Secondary malignant neoplasm of liver and intrahepatic bile duct: Secondary | ICD-10-CM | POA: Diagnosis not present

## 2024-02-01 DIAGNOSIS — C7951 Secondary malignant neoplasm of bone: Secondary | ICD-10-CM

## 2024-02-01 DIAGNOSIS — C50412 Malignant neoplasm of upper-outer quadrant of left female breast: Secondary | ICD-10-CM | POA: Diagnosis not present

## 2024-02-01 DIAGNOSIS — Z5112 Encounter for antineoplastic immunotherapy: Secondary | ICD-10-CM | POA: Diagnosis not present

## 2024-02-01 DIAGNOSIS — Z171 Estrogen receptor negative status [ER-]: Secondary | ICD-10-CM

## 2024-02-01 LAB — CMP (CANCER CENTER ONLY)
ALT: 17 U/L (ref 0–44)
AST: 38 U/L (ref 15–41)
Albumin: 3.8 g/dL (ref 3.5–5.0)
Alkaline Phosphatase: 66 U/L (ref 38–126)
Anion gap: 11 (ref 5–15)
BUN: 21 mg/dL (ref 8–23)
CO2: 24 mmol/L (ref 22–32)
Calcium: 10.3 mg/dL (ref 8.9–10.3)
Chloride: 107 mmol/L (ref 98–111)
Creatinine: 0.78 mg/dL (ref 0.44–1.00)
GFR, Estimated: >60 mL/min (ref >60)
Glucose, Bld: 102 mg/dL — ABNORMAL HIGH (ref 70–99)
Potassium: 4.4 mmol/L (ref 3.5–5.1)
Sodium: 142 mmol/L (ref 135–145)
Total Bilirubin: 0.4 mg/dL (ref 0.0–1.2)
Total Protein: 6.9 g/dL (ref 6.5–8.1)

## 2024-02-01 LAB — CBC WITH DIFFERENTIAL (CANCER CENTER ONLY)
Abs Immature Granulocytes: 0.01 K/uL (ref 0.00–0.07)
Basophils Absolute: 0 K/uL (ref 0.0–0.1)
Basophils Relative: 1 %
Eosinophils Absolute: 0 K/uL (ref 0.0–0.5)
Eosinophils Relative: 1 %
HCT: 36.4 % (ref 36.0–46.0)
Hemoglobin: 12.6 g/dL (ref 12.0–15.0)
Immature Granulocytes: 0 %
Lymphocytes Relative: 34 %
Lymphs Abs: 1.9 K/uL (ref 0.7–4.0)
MCH: 35.9 pg — ABNORMAL HIGH (ref 26.0–34.0)
MCHC: 34.6 g/dL (ref 30.0–36.0)
MCV: 103.7 fL — ABNORMAL HIGH (ref 80.0–100.0)
Monocytes Absolute: 0.6 K/uL (ref 0.1–1.0)
Monocytes Relative: 11 %
Neutro Abs: 3 K/uL (ref 1.7–7.7)
Neutrophils Relative %: 53 %
Platelet Count: 151 K/uL (ref 150–400)
RBC: 3.51 MIL/uL — ABNORMAL LOW (ref 3.87–5.11)
RDW: 14.4 % (ref 11.5–15.5)
WBC Count: 5.6 K/uL (ref 4.0–10.5)
nRBC: 0 % (ref 0.0–0.2)

## 2024-02-01 MED ORDER — ZOLEDRONIC ACID 4 MG/5ML IV CONC
3.0000 mg | Freq: Once | INTRAVENOUS | Status: AC
  Administered 2024-02-01: 3 mg via INTRAVENOUS
  Filled 2024-02-01: qty 3.75

## 2024-02-01 MED ORDER — DEXAMETHASONE SODIUM PHOSPHATE 10 MG/ML IJ SOLN
10.0000 mg | Freq: Once | INTRAMUSCULAR | Status: AC
  Administered 2024-02-01: 10 mg via INTRAVENOUS
  Filled 2024-02-01: qty 1

## 2024-02-01 MED ORDER — PALONOSETRON HCL INJECTION 0.25 MG/5ML
0.2500 mg | Freq: Once | INTRAVENOUS | Status: AC
  Administered 2024-02-01: 0.25 mg via INTRAVENOUS
  Filled 2024-02-01: qty 5

## 2024-02-01 MED ORDER — FAM-TRASTUZUMAB DERUXTECAN-NXKI CHEMO 100 MG IV SOLR
4.0500 mg/kg | Freq: Once | INTRAVENOUS | Status: AC
  Administered 2024-02-01: 200 mg via INTRAVENOUS
  Filled 2024-02-01: qty 10

## 2024-02-01 MED ORDER — ACETAMINOPHEN 325 MG PO TABS
650.0000 mg | ORAL_TABLET | Freq: Once | ORAL | Status: AC
  Administered 2024-02-01: 650 mg via ORAL
  Filled 2024-02-01: qty 2

## 2024-02-01 MED ORDER — DIPHENHYDRAMINE HCL 25 MG PO CAPS
25.0000 mg | ORAL_CAPSULE | Freq: Once | ORAL | Status: AC
  Administered 2024-02-01: 25 mg via ORAL
  Filled 2024-02-01: qty 1

## 2024-02-01 MED ORDER — DEXTROSE 5 % IV SOLN: Freq: Once | INTRAVENOUS | Status: AC

## 2024-02-01 NOTE — Progress Notes (Addendum)
 [facility]  [address] [place],  [place]  [address] [phone]  Clinic Day:  02/01/24  Referring physician: [name], MD  ASSESSMENT & PLAN:  Assessment: Malignant neoplasm of upper-outer quadrant of left breast in female, estrogen receptor negative ([name]) History of stage IIB (T2c N1 M0) HER2 receptor positive, ER/PR negative, left breast cancer diagnosed in June 2020.  She was treated with neoadjuvant Kadcyla /Perjeta  followed by left mastectomy.  She had a complete response in breast and nodes. She had only received two doses of adjuvant trastuzumab  before discontinuing in December 2020. She developed recurrent disease in 2022.   Malignant neoplasm metastatic to liver [facility]) Numerous heterogeneously enhancing, internally necrotic lesions in the right hepatic lobe CT in December 2023, which was consistent with metastatic disease. Largest lesion measures up to 6.7 cm and there are at least 6 lesions.  Liver biopsy was consistent with metastatic breast cancer with negative estrogen and progesterone receptors and positive HER2 receptor. She was initially treated with trastuzumab  with an excellent response with significant decrease in the size of her liver lesions and sustained response. She later had progression of this disease in the chest wall and was switched to Enhertu .  CT chest, abdomen, and pelvis in March revealed stable subtle liver lesions with at least 1 lesion is no longer visualized. CT chest, abdomen and pelvis in October 2024 revealed a decrease in the hepatic lesions with no new lesions seen.  CT imaging in April was fairly stable except a slight increase in the left adrenal nodule. There was a stable vague liver lesion and stable bone metatasis. She continues Enhertu  every 3 weeks without significant difficulty.     Malignant neoplasm metastatic to bone [facility]) Bone metastases diagnosed in December 2022, for which she was placed on zoledronic  acid every 4 weeks.  She is now receiving Zometa  every 6 weeks to correspond with her chemotherapy. CT imaging in April 2025 did not reveal any evidence of progressive disease.  Chest wall recurrence of breast cancer, left ([name]) Left chest wall recurrence diagnosed in January 2023.  Biopsy revealed carcinoma consistent with her previous breast cancer with negative estrogen and progesterone receptors and positive HER2 receptor.  She was initially treated with docetaxel /trastuzumab  and received 10 cycles with a good response.  She then had recurrence in the left chest wall in September 2023, so was switched to Enhertu  good response.  There is no recurrence in the left chest wall.   Polyneuropathy She has had chronic left hand pain, initially thought to be related to lymphedema.  Her lymphedema was controlled. She has decreased strength and muscle wasting.  She was referred to neurology for further evaluation.  EMG revealed polyneuropathy.  She has now seen orthopedics and had an MRI of the left brachial plexus that was unremarkable, but limited due to patient motion.  She had been on gabapentin  100 mg at bedtime, but discontinued that on her own. She saw Dr. [name] in neurology in October 2024 and underwent repeat EMG, which was more abnormal. She has been seen by Dr. [name] with [name] in [place], who did not feel surgery was a good option. The patient no longer sees occupational therapy, but does exercises at home.  Epiphora She is very frustrated by this. She informed me that she was told about a procedure where they can place stents(pyrex tubes) in her eyes tear ducts to help with her excessive tearing. She has made an appointment with her ophthalmologist, Dr. Hughie Bail  in [place].  I encouraged her to pursue this as we know certain agents such as docetaxel  can cause pannicular stenosis of the tear ducts.  I am not sure if her problem is from her prior docetaxel    or whether the current treatment with Enhertu  is also  a culprit  Plan: Patient states that she feels not the best and complains of tearing of the eyes and left hip pain. She informed me that she was told about a procedure where they can place stents(pyrex tubes) in her eyes tear ducts to help with her excessive tearing. She has made an appointment with her ophthalmologist, Dr. Hughie Bail in [place].  I encouraged her to pursue this as we know certain agents such as docetaxel  can cause pannicular stenosis of the tear ducts.  I am not sure if her problem is from her prior docetaxel  or whether the current treatment with Enhertu  is also a culprit.  She also describes a recent skin rash which started out as red bumps on her chest and then spread to her arms and legs, consistent with some form of allergic reaction.  She was seen by the nurse practitioner, Eleanor, and given a Medrol  Dosepak as well as triamcinolone  cream and this has largely resolved now.  She still complains of pain of her left hand which is chronic.  Her day 1 cycle 34 of Enhertu  is scheduled on 12/21/2023. She has a WBC of 88, hemoglobin of 12.8, and platelet count of 159,000. Her CMP is normal other than a elevated calcium of 10.5 and AST of 47. Her last CT scan was in April which revealed multifocal sclerotic bone metastases which are similar to the previous and  stable areas of compression deformity at L2 and T6. The left adrenal nodule appears slightly larger but there is no developing new lymph node enlargement or soft tissue mass. She has fatty liver infiltration with slightly nodular contour, and the vague low-attenuation liver lesion on previous imaging is not well seen. We will plan to repeat scans in October. I will see her back in 3 weeks with CBC and CMP. She denies fever, chills, night sweats, or other signs of infection. She denies cardiorespiratory and gastrointestinal issues. She  denies pain. Her appetite is good and Her weight has decreased 1 pound over the last 3 weeks. The  patient understands the plans discussed today and is in agreement with them.  She knows to contact our office if she develops concerns prior to her next visit.   I provided 21 minutes of face-to-face time during this encounter and > 50% was spent counseling as documented under my assessment and plan.   [name], MD  [facility] - A DEPT OF [name] [address] [place] [address] Dept: [phone] Dept Fax: [phone]   [name].  CHIEF COMPLAINT:  CC: Recurrent HER2 receptor positive breast cancer  Current Treatment: Enhertu  every 3 weeks  HISTORY OF PRESENT ILLNESS:  Taylee Gunnells is a 89 year old woman with recurrent HER2 receptor positive. She was diagnosed with stage IIB (T2 N1 M0) HER2 receptor positive and hormone receptor negative left breast cancer in June 2020 when she was able to palpate a mass of the left breast. Mammogram and ultrasound in May 2020 revealed a 2.9 cm irregular mass in the upper outer left breast as well as two adjacent abnormal left axillary lymph  nodes. Biopsy was obtained on June 3rd and revealed invasive ductal carcinoma and DCIS with lymphovascular invasion. Left axillary lymph node was positive for invasive ductal carcinoma. HER2 was positive 3+. Estrogen and progesterone receptors were both negative at 0%, and Ki67 was 70%. Breast MRI from July revealed a 3.3 cm mass of the lateral portion of the left breast as well as significant non mass enhancement surrounding this mass and extending anteriorly into the nipple base, with largest diameter in the anterior to posterior axis, measuring 7.2 centimeters. Three enlarged left axillary lymph nodes. Right breast was negative.  PET imaging in July revealed no evidence of additional metastatic involvement. She received neoadjuvant trastuzumab /pertuzumab  for 6 cycles. Repeat breast MRI in  October revealed complete resolution of the enhancing mass and associated non mass enhancement within the left breast. Previously identified left axillary lymphadenopathy was no longer identified. She underwent left mastectomy and targeted lymph node dissection in November 2020.  Pathology revealed evidence of malignancy within the breast and three sentinel lymph nodes were negative for malignancy (0/3). She continued maintenance trastuzumab  injections for two more doses, but this was discontinued in December 2020 as she refused further ECHO scans.    Of note, she also has a history of stage II colon cancer, diagnosed in May 2007. Preop CEA was 5.2. She underwent surgical resection and surgical pathology revealed a 5.9 x 3.8 x 1.2 cm moderate to well-differentiated adenocarcinoma penetrating the muscular wall. Forty lymph nodes negative. No vascular or lymphatic invasion. Multiple diverticula with microabscess formation and inflammation. Carcinoma present in at least 1 diverticulum. She received adjuvant chemotherapy with capecitabine for 9 months. Colonoscopy from August 2013 revealed 2 polyps which were resected and chronic diverticulosis.  In December 2022, she was evaluated at the [facility] ED for abdominal and back pain.  CT abdomen pelvis revealed new lytic lesion involving the L2 vertebral body concerning for metastatic disease, as well as multiple hepatic rim-enhancing fluid collections with significant surrounding edema and an additional subtle 1.5 cm hypodensity in the hepatic dome. Given clinical history and presence of a new L2 lesion, leading differential consideration is metastatic disease. MRI abdomen in revealed numerous heterogeneously enhancing, internally necrotic lesions in the right hepatic lobe, highly concerning for metastatic disease. Largest lesion measures up to 6.7 cm in hepatic segment VI. Enhancing osseous lesions in the L2 vertebral body, both iliac bones, and in the sacrum, highly  concerning for osseous metastases was also seen.    PET scan revealed widespread hypermetabolic disease in the left chest wall, left axillary, subpectoral and supraclavicular nodes, liver, left adrenal and bone.  MRI thoracic/lumbar spine revealed widespread osseous metastasis with the largest deposit replacing L2 vertebral body where there was a compression fracture with mild height loss and early epidural tumor extension affecting L2-3 foramina.  MRI brain was negative for metastatic disease.  Biopsy of of the left chest wall  was consistent with recurrent ductal carcinoma of the breast.  HER2 was positive, estrogen and progesterone receptors were negative.  She was initially treated with trastuzumab , as well as zoledronic  acid, beginning in January 2023 with an initial response.   Oncology History Overview Note  Cancer Staging Malignant neoplasm of upper-outer quadrant of left breast in female, estrogen receptor negative ([name]) Staging form: Breast, AJCC 8th Edition - Clinical stage from 10/11/2018: Stage IIB (cT2, cN1, cM0, G3, ER-, PR-, HER2+) - Signed by Lanny Callander, MD on 11/02/2018 - Clinical: No stage assigned - Unsigned    History of colon  cancer, stage II  09/2005 Initial Diagnosis   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 Findings were a 5.9 x 3.8 x 1.2 cm moderate to well-differentiated adenocarcinoma penetrating the muscular wall. Forty lymph nodes negative. No vascular or lymphatic invasion. Multiple diverticula with microabscess formation and inflammation. Carcinoma present in at least 1 diverticulum. Preop CEA 5.2 with lab normal range 0 to 2.5. Preop CT scan with no obvious additional pathology.    2007 -  Chemotherapy   She received oral Xeloda chemotherapy as an adjuvant. She declined treatment on a clinical trial.    11/12/2011 Initial Diagnosis   [name], stage II    12/09/2011 Procedure   Followup colonoscopy done on 12/09/2011. She was found to have 2 polyps which were removed. Chronic diverticulosis.     Malignant neoplasm of upper-outer quadrant of left breast in female, estrogen receptor negative ([name])  09/26/2018 Mammogram   Mammogram/US  of left breast 09/26/18 IMPRESSION:  1. 2.9cm irregular mass in the upper outer left breast corresponds to the palpable abnormality. This is highly suspicious for breast carcinoma.  2. Two adjacent abnormal left axillary  Lymph nodes suspicious for metastatic adenopathy. There is a third borderline abnormal left axillary LN with a cortex thickened to 4mm.  3. Benign right breast cyst. No evidence of right breast malignancy.    10/11/2018 Cancer Staging   Staging form: Breast, AJCC 8th Edition - Clinical stage from 10/11/2018: Stage IIB (cT2, cN1, cM0, G3, ER-, PR-, HER2+) - Signed by Lanny Callander, MD on 11/02/2018   10/11/2018 Initial Biopsy   Diagnosis 10/11/18 1. Breast, left, needle core biopsy, upper outer left 2 o'clock - INVASIVE DUCTAL CARCINOMA. - DUCTAL CARCINOMA IN SITU. - LYMPHOVASCULAR INVASION IS IDENTIFIED. - SEE COMMENT. 2. Lymph node, needle/core biopsy, left axilla - INVASIVE DUCTAL CARCINOMA. - SEE COMMENT.   10/11/2018 Receptors her2   The tumor cells are POSITIVE for Her2 (3+). Estrogen Receptor: 0%, NEGATIVE Progesterone Receptor: 0%, NEGATIVE Proliferation Marker Ki67: 70%   11/02/2018 Initial Diagnosis   Malignant neoplasm of upper-outer quadrant of left breast in female, estrogen receptor negative ([name])   11/15/2018 Breast MRI   MRI breast 11/15/18  IMPRESSION: 1. 3.3 centimeter mass in the LATERAL portion of the LEFT breast consistent with known malignancy. 2. There is significant non mass enhancement surrounding this mass and extending anteriorly into the nipple base, with largest diameter in the anterior to posterior axis, measuring 7.2 centimeters. 3. If the patient would consider breast  conservation, additional MR guided core biopsies are recommended. Consider biopsy of the inferior and anterior extent of the non mass enhancement to document extent of disease. 4. Three enlarged LEFT axillary lymph nodes. 5. RIGHT breast is negative.   11/15/2018 PET scan   PET 11/15/18 IMPRESSION: Hypermetabolic left breast lesion compatible with known primary. Hypermetabolic left axillary lymph nodes are consistent with metastatic disease.   No evidence for additional hypermetabolic metastatic involvement in the neck, chest, abdomen, or pelvis.   11/17/2018 - 03/01/2019 Chemotherapy   Neo-adjuvant Kadcyla  and perjeta  q3weeks for 6 cycles starting 11/17/18. Stopped before surgery    11/29/2018 Pathology Results   Diagnosis 1. Breast, left, needle core biopsy, inferior anterior (cylinder clip) - INVASIVE DUCTAL CARCINOMA. - DUCTAL CARCINOMA IN SITU. - LYMPHOVASCULAR INVASION IS IDENTIFIED. - SEE COMMENT. 2. Breast, left, needle core biopsy, central posterior (barbell clip) - INVASIVE DUCTAL CARCINOMA. - LYMPHOVASCULAR INVASION  IS IDENTIFIED.   02/12/2019 Breast MRI   IMPRESSION: 1. Complete resolution of previously identified enhancing mass and associated non mass enhancement within the left breast. This is consistent with excellent response to chemotherapy. No residual or suspicious findings are identified. 2. No MRI evidence of malignancy on the right. 3. Previously identified left axillary lymphadenopathy not definitively seen on today's study. However, this may be due to decreased field-of-view compared to prior study.     03/14/2019 Cancer Staging   Staging form: Breast, AJCC 8th Edition - Pathologic stage from 03/14/2019: pT0, pN0, cM0, GX, ER: Unknown, PR: Unknown, HER2: Not Assessed - Signed by Lanny Callander, MD on 03/28/2019   03/14/2019 Surgery   LEFT MASTECTOMY WITH TARGETED LYMPH NODE DISSECTION and Left Axillary Sentinel Lymph  Node Biopsy by Dr Curvin 03/14/19     03/14/2019 Pathology Results   FINAL MICROSCOPIC DIAGNOSIS:   A. LYMPH NODE, LEFT, SENTINEL, BIOPSY:  - There is no evidence of carcinoma in 1 of 1 lymph node (0/1).   B. BREAST, LEFT, MASTECTOMY:  - Benign breast parenchyma with treatment-related changes.  - There is no evidence of malignancy.  - See oncology table below.   C. LYMPH NODE, LEFT #1, SENTINEL, BIOPSY:  - There is no evidence of carcinoma in 1 of 1 lymph node (0/1).   D. LYMPH NODE, LEFT #2, SENTINEL, BIOPSY:  - There is no evidence of carcinoma in 1 of 1 lymph node (0/1).     04/04/2019 - 04/25/2019 Chemotherapy   Maintenance Herceptin  injections every 3 weeks starting 04/03/19 to complete 1 year of treatment that was started in 11/2018. Stopped after 2nd dose as she will not be repeating Echos.    06/03/2021 - 06/24/2021 Chemotherapy   Patient is on Treatment Plan : BREAST Trastuzumab  q21d X 11 Cycles     06/25/2021 - 01/01/2022 Chemotherapy   Patient is on Treatment Plan : BREAST Docetaxel  + Trastuzumab  + Pertuzumab  (THP) q21d     06/25/2021 - 01/02/2022 Chemotherapy   Patient is on Treatment Plan : BREAST Docetaxel  + Trastuzumab  + Pertuzumab  (THP) q21d     09/14/2021 Imaging   CT chest/abdomen/pelvis  IMPRESSION:  1. Interval resolution of previously seen bulky left axillary and  subpectoral lymphadenopathy. No persistently enlarged lymph nodes.  2. Multiple liver lesions are significantly diminished in size.  3. Widespread osseous metastatic disease, with an interval increase  in sclerosis of several previously lytic lesions.  4. Constellation of findings is consistent with treatment response  of metastatic disease  5. New, although age indeterminate pathologic wedge deformity of the  T6 vertebral body as well as an increased pathologic wedge deformity  of the L2 vertebral body.  6. Coronary artery disease.  Aortic Atherosclerosis (ICD10-I70.0).    01/26/2022 -  Chemotherapy   Patient is on Treatment Plan :  BREAST METASTATIC Fam-Trastuzumab Deruxtecan-nxki  (Enhertu ) (5.4) q21d     Malignant neoplasm metastatic to liver ([name])  04/17/2021 Imaging   CT ABDOMEN AND PELVIS WITH CONTRAST: -New lytic lesion involving the L2 vertebral body (6:72, 2:26) with minimal cortical breakthrough superiorly, but with preservation of vertebral body height, concerning for metastatic disease.  -There are several indeterminate hepatic lesions as described above. In addition to the lesions described above, there is an additional subtle 1.5 cm hypodensity in the hepatic dome (2:7). Given clinical history and presence of a new L2 lesion, leading differential consideration is metastatic disease. Recommend further evaluation with MRI of the abdomen with and without contrast.  05/12/2021 Imaging   MRI ABDOMEN WITH AND WITHOUT CONTRAST: Numerous heterogeneously enhancing, internally necrotic lesions in the right hepatic lobe, highly concerning for metastatic disease. Largest lesion measures up to 6.7 cm in hepatic segment VI.   Enhancing osseous lesions in the L2 vertebral body, both iliac bones, and in the sacrum, highly concerning for osseous metastases.   05/15/2021 Initial Diagnosis   Liver metastases ([name])   05/27/2021 PET scan   1. Widespread recurrent/metastatic disease in this patient who is  status post bilateral mastectomy. Left chest wall recurrence with  left axillary/subpectoral, supraclavicular nodal metastasis.  2. Hepatic, left adrenal, and widespread osseous metastasis.  3. Incidental findings, including: Right nephrolithiasis. Tiny  hiatal hernia.    05/27/2021 Imaging   MRI LUMBAR AND THORACIC SPINE: Thoracic spine:  Osseous metastatic disease involving each level. The most dramatic deposits are at T4 and T6 where there is extraosseous/epidural tumor. No cord compression but there is foraminal impingement on the left at T6-7 and on the right at T3-4. Early dorsal epidural tumor at the level of T10 and T3.    Lumbar spine:  1. Widespread osseous metastatic disease with largest deposit  replacing the L2 body where there is a compression fracture with  mild height loss. Also at this level is early epidural tumor  extension likely affecting both L2-3 foramina.  2. 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    05/27/2021 Imaging   MRI HEAD WITH AND WITHOUT CONTRAST: Negative for metastatic disease to the brain or calvarium.   06/03/2021 - 06/24/2021 Chemotherapy   Patient is on Treatment Plan : BREAST Trastuzumab  q21d X 11 Cycles     06/25/2021 - 01/01/2022 Chemotherapy   Patient is on Treatment Plan : BREAST Docetaxel  + Trastuzumab  + Pertuzumab  (THP) q21d     06/25/2021 - 01/02/2022 Chemotherapy   Patient is on Treatment Plan : BREAST Docetaxel  + Trastuzumab  + Pertuzumab  (THP) q21d     09/14/2021 Imaging   CT chest/abdomen/pelvis  IMPRESSION:  1. Interval resolution of previously seen bulky left axillary and  subpectoral lymphadenopathy. No persistently enlarged lymph nodes.  2. Multiple liver lesions are significantly diminished in size.  3. Widespread osseous metastatic disease, with an interval increase  in sclerosis of several previously lytic lesions.  4. Constellation of findings is consistent with treatment response  of metastatic disease  5. New, although age indeterminate pathologic wedge deformity of the  T6 vertebral body as well as an increased pathologic wedge deformity  of the L2 vertebral body.  6. Coronary artery disease.  Aortic Atherosclerosis (ICD10-I70.0).    01/26/2022 -  Chemotherapy   Patient is on Treatment Plan : BREAST METASTATIC Fam-Trastuzumab Deruxtecan-nxki  (Enhertu ) (5.4) q21d     Malignant neoplasm metastatic to bone ([name])  04/17/2021 Imaging   CT ABDOMEN AND PELVIS WITH CONTRAST: -New lytic lesion involving the L2 vertebral body (6:72, 2:26) with minimal cortical breakthrough superiorly, but with preservation of vertebral body  height, concerning for metastatic disease.  -There are several indeterminate hepatic lesions as described above. In addition to the lesions described above, there is an additional subtle 1.5 cm hypodensity in the hepatic dome (2:7). Given clinical history and presence of a new L2 lesion, leading differential consideration is metastatic disease. Recommend further evaluation with MRI of the abdomen with and without contrast.   05/12/2021 Imaging   MRI ABDOMEN WITH AND WITHOUT CONTRAST: Numerous heterogeneously enhancing, internally necrotic lesions in the right hepatic lobe, highly concerning for metastatic disease. Largest  lesion measures up to 6.7 cm in hepatic segment VI.   Enhancing osseous lesions in the L2 vertebral body, both iliac bones, and in the sacrum, highly concerning for osseous metastases.   05/15/2021 Initial Diagnosis   Bone metastases ([name])   05/27/2021 PET scan   1. Widespread recurrent/metastatic disease in this patient who is  status post bilateral mastectomy. Left chest wall recurrence with  left axillary/subpectoral, supraclavicular nodal metastasis.  2. Hepatic, left adrenal, and widespread osseous metastasis.  3. Incidental findings, including: Right nephrolithiasis. Tiny  hiatal hernia.    05/27/2021 Imaging   MRI LUMBAR AND THORACIC SPINE: Thoracic spine:  Osseous metastatic disease involving each level. The most dramatic deposits are at T4 and T6 where there is extraosseous/epidural tumor. No cord compression but there is foraminal impingement on the left at T6-7 and on the right at T3-4. Early dorsal epidural tumor at the level of T10 and T3.   Lumbar spine:  1. Widespread osseous metastatic disease with largest deposit  replacing the L2 body where there is a compression fracture with  mild height loss. Also at this level is early epidural tumor  extension likely affecting both L2-3 foramina.  2. Lumbar spine degeneration with scoliosis and multilevel   impingement.    05/27/2021 Imaging   MRI HEAD WITH AND WITHOUT CONTRAST: Negative for metastatic disease to the brain or calvarium.   06/03/2021 - 06/24/2021 Chemotherapy   Patient is on Treatment Plan : BREAST Trastuzumab  q21d X 11 Cycles     06/25/2021 - 01/01/2022 Chemotherapy   Patient is on Treatment Plan : BREAST Docetaxel  + Trastuzumab  + Pertuzumab  (THP) q21d     06/25/2021 - 01/02/2022 Chemotherapy   Patient is on Treatment Plan : BREAST Docetaxel  + Trastuzumab  + Pertuzumab  (THP) q21d     09/14/2021 Imaging   CT chest/abdomen/pelvis  IMPRESSION:  1. Interval resolution of previously seen bulky left axillary and  subpectoral lymphadenopathy. No persistently enlarged lymph nodes.  2. Multiple liver lesions are significantly diminished in size.  3. Widespread osseous metastatic disease, with an interval increase  in sclerosis of several previously lytic lesions.  4. Constellation of findings is consistent with treatment response  of metastatic disease  5. New, although age indeterminate pathologic wedge deformity of the  T6 vertebral body as well as an increased pathologic wedge deformity  of the L2 vertebral body.  6. Coronary artery disease.  Aortic Atherosclerosis (ICD10-I70.0).    01/26/2022 -  Chemotherapy   Patient is on Treatment Plan : BREAST METASTATIC Fam-Trastuzumab Deruxtecan-nxki  (Enhertu ) (5.4) q21d         INTERVAL HISTORY:  Jasmin Lewis is here today for repeat clinical assessment for her recurrent HER2 receptor positive breast cancer. Patient states that she feels not the best and complains of tearing of the eyes and left hip pain. She informed me that she was told about a procedure where they can place stents(pyrex tubes) in her eyes tear ducts to help with her excessive tearing. She has made an appointment with her ophthalmologist, Dr. Hughie Bail in [place].  I encouraged her to pursue this as we know certain agents such as docetaxel  can cause pannicular stenosis  of the tear ducts.  I am not sure if her problem is from her prior docetaxel  or whether the current treatment with Enhertu  is also a culprit.  She also describes a recent skin rash which started out as red bumps on her chest and then spread to her arms and legs, consistent with some  form of allergic reaction.  She was seen by the nurse practitioner, Eleanor, and given a Medrol  Dosepak as well as triamcinolone  cream and this has largely resolved now.  She still complains of pain of her left hand which is chronic.  Her day 1 cycle 34 of Enhertu  is scheduled on 12/21/2023. She has a WBC of 88, hemoglobin of 12.8, and platelet count of 159,000. Her CMP is normal other than a elevated calcium of 10.5 and AST of 47. Her last CT scan was in April which revealed multifocal sclerotic bone metastases which are similar to the previous and  stable areas of compression deformity at L2 and T6. The left adrenal nodule appears slightly larger but there is no developing new lymph node enlargement or soft tissue mass. She has fatty liver infiltration with slightly nodular contour, and the vague low-attenuation liver lesion on previous imaging is not well seen. We will plan to repeat scans in October. I will see her back in 3 weeks with CBC and CMP. She denies fever, chills, night sweats, or other signs of infection. She denies cardiorespiratory and gastrointestinal issues. She  denies pain. Her appetite is good and Her weight has decreased 1 pound over the last 3 weeks.  REVIEW OF SYSTEMS:   Review of Systems  Constitutional: Negative.  Negative for appetite change, chills, fatigue, fever and unexpected weight change.  HENT:  Negative.  Negative for lump/mass, mouth sores and sore throat.   Eyes:  Positive for eye problems (excessive tearing).  Respiratory: Negative.  Negative for chest tightness, cough, hemoptysis, shortness of breath and wheezing.   Cardiovascular: Negative.  Negative for chest pain, leg swelling and  palpitations.  Gastrointestinal: Negative.  Negative for abdominal distention, abdominal pain, blood in stool, constipation, diarrhea, nausea and vomiting.  Endocrine: Negative.  Negative for hot flashes.  Genitourinary: Negative.  Negative for difficulty urinating, dysuria, frequency and hematuria.   Musculoskeletal:  Positive for arthralgias and gait problem (uses a cane). Negative for back pain, flank pain and myalgias.  Skin: Negative.  Negative for rash.  Neurological:  Positive for extremity weakness (atrophy and severe left hand weakness) and gait problem (uses a cane). Negative for dizziness, headaches, light-headedness, numbness, seizures and speech difficulty.  Hematological: Negative.  Negative for adenopathy. Does not bruise/bleed easily.  Psychiatric/Behavioral: Negative.  Negative for depression and sleep disturbance. The patient is not nervous/anxious.     VITALS:  There were no vitals taken for this visit.  Wt Readings from Last 3 Encounters:  01/11/24 108 lb 14.4 oz (49.4 kg)  12/21/23 109 lb 9.6 oz (49.7 kg)  11/30/23 108 lb 11.2 oz (49.3 kg)    There is no height or weight on file to calculate BMI.  Performance status (ECOG): 1 - Symptomatic but completely ambulatory  PHYSICAL EXAM:   Physical Exam Vitals and nursing note reviewed.  Constitutional:      General: She is not in acute distress.    Appearance: Normal appearance. She is normal weight. She is not ill-appearing, toxic-appearing or diaphoretic.  HENT:     Head: Normocephalic and atraumatic.     Right Ear: Tympanic membrane, ear canal and external ear normal. There is no impacted cerumen.     Left Ear: Tympanic membrane, ear canal and external ear normal. There is no impacted cerumen.     Nose: Nose normal. No congestion or rhinorrhea.     Mouth/Throat:     Mouth: Mucous membranes are moist.     Pharynx: Oropharynx  is clear. No oropharyngeal exudate or posterior oropharyngeal erythema.  Eyes:      General: No scleral icterus.       Right eye: No discharge.        Left eye: No discharge.     Extraocular Movements: Extraocular movements intact.     Conjunctiva/sclera: Conjunctivae normal.     Pupils: Pupils are equal, round, and reactive to light.     Comments: Excessive tearing  Neck:     Vascular: No carotid bruit.  Cardiovascular:     Rate and Rhythm: Normal rate and regular rhythm.     Pulses: Normal pulses.     Heart sounds: Normal heart sounds. No murmur heard.    No friction rub. No gallop.  Pulmonary:     Effort: Pulmonary effort is normal. No respiratory distress.     Breath sounds: Normal breath sounds. No stridor. No wheezing, rhonchi or rales.  Chest:     Chest wall: No tenderness.  Breasts:    Right: Normal.     Left: Normal.     Comments: Left chest wall is negative. Right breast is without masses Abdominal:     General: Bowel sounds are normal. There is no distension.     Palpations: Abdomen is soft. There is no hepatomegaly, splenomegaly or mass.     Tenderness: There is no abdominal tenderness. There is no right CVA tenderness, left CVA tenderness, guarding or rebound.     Hernia: No hernia is present.  Musculoskeletal:        General: No swelling, tenderness, deformity or signs of injury. Normal range of motion.     Right hand: Normal.     Left hand: Decreased strength.     Cervical back: Normal range of motion and neck supple. No rigidity or tenderness.     Right lower leg: No edema.     Left lower leg: No edema.     Comments: Wasting of the left hand  Lymphadenopathy:     Cervical: No cervical adenopathy.     Right cervical: No superficial, deep or posterior cervical adenopathy.    Left cervical: No superficial, deep or posterior cervical adenopathy.     Upper Body:     Right upper body: No supraclavicular, axillary or pectoral adenopathy.     Left upper body: No supraclavicular, axillary or pectoral adenopathy.  Skin:    General: Skin is warm and  dry.     Coloration: Skin is not jaundiced or pale.     Findings: No bruising, erythema, lesion or rash.  Neurological:     General: No focal deficit present.     Mental Status: She is alert and oriented to person, place, and time. Mental status is at baseline.     Cranial Nerves: No cranial nerve deficit.     Sensory: No sensory deficit.     Motor: No weakness.     Coordination: Coordination normal.     Gait: Gait normal.     Deep Tendon Reflexes: Reflexes normal.  Psychiatric:        Mood and Affect: Mood normal.        Behavior: Behavior normal.        Thought Content: Thought content normal.        Judgment: Judgment normal.    LABS:      Latest Ref Rng & Units 01/11/2024    1:08 PM 12/21/2023    1:40 PM 11/30/2023    1:08 PM  CBC  WBC 4.0 - 10.5 K/uL 4.4  4.8  4.3   Hemoglobin 12.0 - 15.0 g/dL 87.1  87.1  87.4   Hematocrit 36.0 - 46.0 % 37.3  37.0  37.1   Platelets 150 - 400 K/uL 163  159  161       Latest Ref Rng & Units 01/11/2024    1:08 PM 12/21/2023    1:40 PM 11/30/2023    1:08 PM  CMP  Glucose 70 - 99 mg/dL 97  89  99   BUN 8 - 23 mg/dL 15  21  18    Creatinine 0.44 - 1.00 mg/dL 9.25  9.14  9.17   Sodium 135 - 145 mmol/L 138  141  140   Potassium 3.5 - 5.1 mmol/L 4.4  4.4  4.6   Chloride 98 - 111 mmol/L 105  107  106   CO2 22 - 32 mmol/L 23  24  25    Calcium 8.9 - 10.3 mg/dL 89.8  89.4  89.8   Total Protein 6.5 - 8.1 g/dL 7.0  6.8  6.7   Total Bilirubin 0.0 - 1.2 mg/dL 0.6  0.5  0.5   Alkaline Phos 38 - 126 U/L 65  62  62   AST 15 - 41 U/L 39  47  39   ALT 0 - 44 U/L 19  22  17     Lab Results  Component Value Date   CEA1 1.8 06/03/2021   CEA 0.5 11/13/2012   CEA  Date Value Ref Range Status  06/03/2021 1.8 0.0 - 4.7 ng/mL Final    Comment:    (NOTE)                             Nonsmokers          <3.9                             Smokers             <5.6 [employer] Electrochemiluminescence Immunoassay (ECLIA) Values obtained with different  assay methods or kits cannot be used interchangeably.  Results cannot be interpreted as absolute evidence of the presence or absence of malignant disease. Performed At: [facility] [address] [place], [place] [id number] Jennette Shorter MD Ey:[id number]   11/13/2012 0.5 0.0 - 5.0 ng/mL Final   Lab Results  Component Value Date   LDH 186 11/12/2011   LDH 187 09/08/2010   LDH 171 09/09/2009   STUDIES:  EXAM: 08/11/2023 CT CHEST, ABDOMEN, AND PELVIS WITH CONTRAST IMPRESSION: Multifocal sclerotic bone metastases are similar to previous. Stable areas of compression deformity at L2 and T6. Separate healing right lateral rib fracture. Left adrenal nodule appears slightly larger today. Attention on follow-up. No developing new lymph node enlargement, soft tissue mass. Fatty liver infiltration with slightly nodular contour. Vague low-attenuation liver lesion on previous is not well seen today. No new liver lesion at this time.   HISTORY:   Past Medical History:  Diagnosis Date   Arthritis    shoulder   Breast cancer ([name])    Cancer ([name])    left breast ca   Colon cancer ([name])    [name], stage II 11/12/2011   Sigmoid lesion 5.9 cm  40 nodes negative but focus of cancer in a diverticum   Pre-op CEA 5.2 with lab normal up to 2.5 resected 09/21/05  Xeloda adjuvant chemotherapy   Hypertension    Polyneuropathy 02/03/2023   Right shoulder pain 04/12/2023    Past Surgical History:  Procedure Laterality Date   COLONOSCOPY     HEMICOLECTOMY  2007   MASTECTOMY WITH AXILLARY LYMPH NODE DISSECTION Left 03/14/2019   Procedure: LEFT MASTECTOMY WITH TARGETED LYMPH NODE DISSECTION;  Surgeon: Curvin Deward MOULD, MD;  Location: [facility] OR;  Service: General;  Laterality: Left;   SENTINEL NODE BIOPSY Left 03/14/2019   Procedure: Left Axillary Sentinel Lymph  Node Biopsy;  Surgeon: Curvin Deward MOULD, MD;  Location: [facility] OR;  Service: General;  Laterality: Left;   TONSILLECTOMY     WISDOM  TOOTH EXTRACTION      Family History  Problem Relation Age of Onset   Cancer Maternal Aunt        colon cancer     Social History:  reports that she has never smoked. She has never used smokeless tobacco. She reports that she does not drink alcohol and does not use drugs.The patient is alone today.  Allergies: No Known Allergies  Current Medications: Current Outpatient Medications  Medication Sig Dispense Refill   Biotin 1 MG CAPS Take by mouth.     bisacodyl (DULCOLAX) 5 MG EC tablet Take 5 mg by mouth daily as needed for moderate constipation. 2 capsules every night     co-enzyme Q-10 30 MG capsule Take 100 mg by mouth daily.     fish oil-omega-3 fatty acids 1000 MG capsule Take 1 g by mouth daily.     MAGNESIUM PO Take 1 tablet by mouth every evening. 600 mg     methylPREDNISolone  (MEDROL  DOSEPAK) 4 MG TBPK tablet Take as directed on packet 21 tablet 0   Multiple Vitamin (MULTIVITAMIN) capsule Take 1 capsule by mouth daily.     ondansetron  (ZOFRAN ) 4 MG tablet Take 1 tablet (4 mg total) by mouth every 8 (eight) hours as needed for nausea or vomiting. (Patient not taking: Reported on 01/11/2024) 20 tablet 2   polyethylene glycol (MIRALAX / GLYCOLAX) 17 g packet Take 17 g by mouth as needed. constipation     triamcinolone  cream (KENALOG ) 0.1 % Apply 1 Application topically 2 (two) times daily. 30 g 1   No current facility-administered medications for this visit.

## 2024-02-01 NOTE — Patient Instructions (Signed)
 Fam-Trastuzumab  Deruxtecan Injection What is this medication? FAM-TRASTUZUMAB  DERUXTECAN (fam-tras TOOZ eu mab DER ux TEE kan) treats some types of cancer. It works by blocking a protein that causes cancer cells to grow and multiply. This helps to slow or stop the spread of cancer cells. This medicine may be used for other purposes; ask your health care provider or pharmacist if you have questions. COMMON BRAND NAME(S): ENHERTU  What should I tell my care team before I take this medication? They need to know if you have any of these conditions: Heart disease Heart failure Infection, especially a viral infection, such as chickenpox, cold sores, or herpes Liver disease Lung or breathing disease, such as asthma or COPD An unusual or allergic reaction to fam-trastuzumab  deruxtecan, other medications, foods, dyes, or preservatives Pregnant or trying to get pregnant Breast-feeding How should I use this medication? This medication is injected into a vein. It is given by your care team in a hospital or clinic setting. A special MedGuide will be given to you before each treatment. Be sure to read this information carefully each time. Talk to your care team about the use of this medication in children. Special care may be needed. Overdosage: If you think you have taken too much of this medicine contact a poison control center or emergency room at once. NOTE: This medicine is only for you. Do not share this medicine with others. What if I miss a dose? It is important not to miss your dose. Call your care team if you are unable to keep an appointment. What may interact with this medication? Interactions are not expected. This list may not describe all possible interactions. Give your health care provider a list of all the medicines, herbs, non-prescription drugs, or dietary supplements you use. Also tell them if you smoke, drink alcohol , or use illegal drugs. Some items may interact with your  medicine. What should I watch for while using this medication? Visit your care team for regular checks on your progress. Tell your care team if your symptoms do not start to get better or if they get worse. This medication may increase your risk of getting an infection. Call your care team for advice if you get a fever, chills, sore throat, or other symptoms of a cold or flu. Do not treat yourself. Try to avoid being around people who are sick. Avoid taking medications that contain aspirin, acetaminophen , ibuprofen, naproxen, or ketoprofen unless instructed by your care team. These medications may hide a fever. Be careful brushing or flossing your teeth or using a toothpick because you may get an infection or bleed more easily. If you have any dental work done, tell your dentist you are receiving this medication. This medication may cause dry eyes and blurred vision. If you wear contact lenses, you may feel some discomfort. Lubricating eye drops may help. See your care team if the problem does not go away or is severe. Talk to your care team if you may be pregnant. Serious birth defects can occur if you take this medication during pregnancy and for 7 months after the last dose. If your partner can get pregnant, use a condom during sex while taking this medication and for 4 months after the last dose. Do not breastfeed while taking this medication and for 7 months after the last dose. This medication may cause infertility. Talk to your care team if you are concerned about your fertility. What side effects may I notice from receiving this medication? Side effects  that you should report to your care team as soon as possible: Allergic reactions--skin rash, itching, hives, swelling of the face, lips, tongue, or throat Dry cough, shortness of breath or trouble breathing Infection--fever, chills, cough, sore throat, wounds that don't heal, pain or trouble when passing urine, general feeling of discomfort or  being unwell Heart failure--shortness of breath, swelling of the ankles, feet, or hands, sudden weight gain, unusual weakness or fatigue Unusual bruising or bleeding Side effects that usually do not require medical attention (report these to your care team if they continue or are bothersome): Constipation Diarrhea Hair loss Muscle pain Nausea Vomiting This list may not describe all possible side effects. Call your doctor for medical advice about side effects. You may report side effects to FDA at 1-800-FDA-1088. Where should I keep my medication? This medication is given in a hospital or clinic. It will not be stored at home. NOTE: This sheet is a summary. It may not cover all possible information. If you have questions about this medicine, talk to your doctor, pharmacist, or health care provider.  2024 Elsevier/Gold Standard (2022-12-24 00:00:00)

## 2024-02-02 ENCOUNTER — Other Ambulatory Visit: Payer: Self-pay

## 2024-02-07 ENCOUNTER — Other Ambulatory Visit: Payer: Self-pay | Admitting: Oncology

## 2024-02-07 ENCOUNTER — Encounter: Payer: Self-pay | Admitting: Oncology

## 2024-02-07 DIAGNOSIS — C787 Secondary malignant neoplasm of liver and intrahepatic bile duct: Secondary | ICD-10-CM

## 2024-02-08 ENCOUNTER — Telehealth: Payer: Self-pay | Admitting: Oncology

## 2024-02-08 NOTE — Telephone Encounter (Signed)
 Left msg notifying pt of CT scan being ordered, notified her that she will receive a call from the imaging dept to set up the scan once approved with insurance.   Scheduling Message Entered by [name] on 02/07/2024 at  7:19 PM Priority: Routine <No visit type provided>  Department: [facility]  Provider:  Scheduling Notes:  She is due for CT scans in October but I know they are backed up.  So let's schedule her CT CAP for end of October. Let her know that will be 6 months since her last one, & I will see in early Nov.(As well as mid Oct)

## 2024-02-15 ENCOUNTER — Other Ambulatory Visit: Payer: Self-pay | Admitting: Oncology

## 2024-02-15 DIAGNOSIS — C50412 Malignant neoplasm of upper-outer quadrant of left female breast: Secondary | ICD-10-CM

## 2024-02-15 DIAGNOSIS — C787 Secondary malignant neoplasm of liver and intrahepatic bile duct: Secondary | ICD-10-CM

## 2024-02-15 DIAGNOSIS — C7951 Secondary malignant neoplasm of bone: Secondary | ICD-10-CM

## 2024-02-22 ENCOUNTER — Inpatient Hospital Stay: Admitting: Oncology

## 2024-02-22 ENCOUNTER — Inpatient Hospital Stay

## 2024-02-22 ENCOUNTER — Telehealth: Payer: Self-pay | Admitting: Oncology

## 2024-02-22 ENCOUNTER — Inpatient Hospital Stay: Attending: Hematology and Oncology

## 2024-02-22 ENCOUNTER — Encounter: Payer: Self-pay | Admitting: Oncology

## 2024-02-22 VITALS — BP 148/70 | HR 81 | Temp 97.9°F | Resp 18 | Ht 60.25 in | Wt 105.6 lb

## 2024-02-22 DIAGNOSIS — C7951 Secondary malignant neoplasm of bone: Secondary | ICD-10-CM | POA: Diagnosis not present

## 2024-02-22 DIAGNOSIS — Z171 Estrogen receptor negative status [ER-]: Secondary | ICD-10-CM

## 2024-02-22 DIAGNOSIS — C787 Secondary malignant neoplasm of liver and intrahepatic bile duct: Secondary | ICD-10-CM | POA: Diagnosis not present

## 2024-02-22 DIAGNOSIS — H04209 Unspecified epiphora, unspecified lacrimal gland: Secondary | ICD-10-CM | POA: Diagnosis not present

## 2024-02-22 DIAGNOSIS — E278 Other specified disorders of adrenal gland: Secondary | ICD-10-CM | POA: Diagnosis not present

## 2024-02-22 DIAGNOSIS — C50412 Malignant neoplasm of upper-outer quadrant of left female breast: Secondary | ICD-10-CM | POA: Insufficient documentation

## 2024-02-22 DIAGNOSIS — Z5112 Encounter for antineoplastic immunotherapy: Secondary | ICD-10-CM | POA: Insufficient documentation

## 2024-02-22 DIAGNOSIS — Z9012 Acquired absence of left breast and nipple: Secondary | ICD-10-CM | POA: Diagnosis not present

## 2024-02-22 DIAGNOSIS — K760 Fatty (change of) liver, not elsewhere classified: Secondary | ICD-10-CM | POA: Diagnosis not present

## 2024-02-22 DIAGNOSIS — G629 Polyneuropathy, unspecified: Secondary | ICD-10-CM | POA: Diagnosis not present

## 2024-02-22 DIAGNOSIS — S2231XD Fracture of one rib, right side, subsequent encounter for fracture with routine healing: Secondary | ICD-10-CM | POA: Diagnosis not present

## 2024-02-22 DIAGNOSIS — M6250 Muscle wasting and atrophy, not elsewhere classified, unspecified site: Secondary | ICD-10-CM | POA: Diagnosis not present

## 2024-02-22 DIAGNOSIS — Z85038 Personal history of other malignant neoplasm of large intestine: Secondary | ICD-10-CM | POA: Insufficient documentation

## 2024-02-22 DIAGNOSIS — Z800 Family history of malignant neoplasm of digestive organs: Secondary | ICD-10-CM | POA: Insufficient documentation

## 2024-02-22 DIAGNOSIS — I972 Postmastectomy lymphedema syndrome: Secondary | ICD-10-CM | POA: Insufficient documentation

## 2024-02-22 DIAGNOSIS — Z1722 Progesterone receptor negative status: Secondary | ICD-10-CM | POA: Diagnosis not present

## 2024-02-22 LAB — CBC WITH DIFFERENTIAL (CANCER CENTER ONLY)
Abs Immature Granulocytes: 0.01 K/uL (ref 0.00–0.07)
Basophils Absolute: 0.1 K/uL (ref 0.0–0.1)
Basophils Relative: 1 %
Eosinophils Absolute: 0.1 K/uL (ref 0.0–0.5)
Eosinophils Relative: 1 %
HCT: 36.4 % (ref 36.0–46.0)
Hemoglobin: 12.5 g/dL (ref 12.0–15.0)
Immature Granulocytes: 0 %
Lymphocytes Relative: 34 %
Lymphs Abs: 1.8 K/uL (ref 0.7–4.0)
MCH: 35.7 pg — ABNORMAL HIGH (ref 26.0–34.0)
MCHC: 34.3 g/dL (ref 30.0–36.0)
MCV: 104 fL — ABNORMAL HIGH (ref 80.0–100.0)
Monocytes Absolute: 0.7 K/uL (ref 0.1–1.0)
Monocytes Relative: 13 %
Neutro Abs: 2.7 K/uL (ref 1.7–7.7)
Neutrophils Relative %: 51 %
Platelet Count: 213 K/uL (ref 150–400)
RBC: 3.5 MIL/uL — ABNORMAL LOW (ref 3.87–5.11)
RDW: 14.7 % (ref 11.5–15.5)
WBC Count: 5.3 K/uL (ref 4.0–10.5)
nRBC: 0 % (ref 0.0–0.2)

## 2024-02-22 LAB — CMP (CANCER CENTER ONLY)
ALT: 20 U/L (ref 0–44)
AST: 46 U/L — ABNORMAL HIGH (ref 15–41)
Albumin: 4.2 g/dL (ref 3.5–5.0)
Alkaline Phosphatase: 69 U/L (ref 38–126)
Anion gap: 11 (ref 5–15)
BUN: 25 mg/dL — ABNORMAL HIGH (ref 8–23)
CO2: 25 mmol/L (ref 22–32)
Calcium: 10.8 mg/dL — ABNORMAL HIGH (ref 8.9–10.3)
Chloride: 107 mmol/L (ref 98–111)
Creatinine: 0.84 mg/dL (ref 0.44–1.00)
GFR, Estimated: >60 mL/min (ref >60)
Glucose, Bld: 101 mg/dL — ABNORMAL HIGH (ref 70–99)
Potassium: 4.5 mmol/L (ref 3.5–5.1)
Sodium: 143 mmol/L (ref 135–145)
Total Bilirubin: 0.4 mg/dL (ref 0.0–1.2)
Total Protein: 7.1 g/dL (ref 6.5–8.1)

## 2024-02-22 MED ORDER — ACETAMINOPHEN 325 MG PO TABS
650.0000 mg | ORAL_TABLET | Freq: Once | ORAL | Status: AC
  Administered 2024-02-22: 650 mg via ORAL
  Filled 2024-02-22: qty 2

## 2024-02-22 MED ORDER — PALONOSETRON HCL INJECTION 0.25 MG/5ML
0.2500 mg | Freq: Once | INTRAVENOUS | Status: AC
  Administered 2024-02-22: 0.25 mg via INTRAVENOUS
  Filled 2024-02-22: qty 5

## 2024-02-22 MED ORDER — DEXTROSE 5 % IV SOLN: Freq: Once | INTRAVENOUS | Status: AC

## 2024-02-22 MED ORDER — DIPHENHYDRAMINE HCL 25 MG PO CAPS
25.0000 mg | ORAL_CAPSULE | Freq: Once | ORAL | Status: AC
  Administered 2024-02-22: 25 mg via ORAL
  Filled 2024-02-22: qty 1

## 2024-02-22 MED ORDER — FAM-TRASTUZUMAB DERUXTECAN-NXKI CHEMO 100 MG IV SOLR
175.0000 mg | Freq: Once | INTRAVENOUS | Status: AC
  Administered 2024-02-22: 180 mg via INTRAVENOUS
  Filled 2024-02-22: qty 9

## 2024-02-22 MED ORDER — DEXAMETHASONE SOD PHOSPHATE PF 10 MG/ML IJ SOLN
10.0000 mg | Freq: Once | INTRAMUSCULAR | Status: AC
  Administered 2024-02-22: 10 mg via INTRAVENOUS

## 2024-02-22 NOTE — Telephone Encounter (Signed)
 Patient has been scheduled for follow-up visit per 02/22/24 LOS.  Pt given an appt calendar with date and time.

## 2024-02-22 NOTE — Progress Notes (Signed)
 [facility]  [address] [place],  [place]  [address] [phone]  Clinic Day: 02/22/2024  Referring physician: [name], MD  ASSESSMENT & PLAN:  Assessment: Malignant neoplasm of upper-outer quadrant of left breast in female, estrogen receptor negative ([name]) History of stage IIB (T2c N1 M0) HER2 receptor positive, ER/PR negative, left breast cancer diagnosed in June 2020.  She was treated with neoadjuvant Kadcyla /Perjeta  followed by left mastectomy.  She had a complete response in breast and nodes. She had only received two doses of adjuvant trastuzumab  before discontinuing in December 2020. She developed recurrent disease in 2022.   Malignant neoplasm metastatic to liver [facility]) Numerous heterogeneously enhancing, internally necrotic lesions in the right hepatic lobe CT in December 2023, which was consistent with metastatic disease. Largest lesion measures up to 6.7 cm and there are at least 6 lesions.  Liver biopsy was consistent with metastatic breast cancer with negative estrogen and progesterone receptors and positive HER2 receptor. She was initially treated with trastuzumab  with an excellent response with significant decrease in the size of her liver lesions and sustained response. She later had progression of this disease in the chest wall and was switched to Enhertu .  CT chest, abdomen, and pelvis in March revealed stable subtle liver lesions with at least 1 lesion is no longer visualized. CT chest, abdomen and pelvis in October 2024 revealed a decrease in the hepatic lesions with no new lesions seen.  CT imaging in April was fairly stable except a slight increase in the left adrenal nodule. There was a stable vague liver lesion and stable bone metatasis. She continues Enhertu  every 3 weeks without significant difficulty.     Malignant neoplasm metastatic to bone [facility]) Bone metastases diagnosed in December 2022, for which she was placed on zoledronic  acid every 4 weeks.  She is now receiving Zometa  every 6 weeks to correspond with her chemotherapy. CT imaging in April 2025 did not reveal any evidence of progressive disease.  Chest wall recurrence of breast cancer, left ([name]) Left chest wall recurrence diagnosed in January 2023.  Biopsy revealed carcinoma consistent with her previous breast cancer with negative estrogen and progesterone receptors and positive HER2 receptor.  She was initially treated with docetaxel /trastuzumab  and received 10 cycles with a good response.  She then had recurrence in the left chest wall in September 2023, so was switched to Enhertu  with good response.  There is no recurrence in the left chest wall.   Polyneuropathy She has had chronic left hand pain, initially thought to be related to lymphedema and neuropathy.  Her lymphedema was controlled. She has decreased strength and muscle wasting.  She was referred to neurology for further evaluation.  EMG revealed polyneuropathy.  She has now seen orthopedics and had an MRI of the left brachial plexus that was unremarkable, but limited due to patient motion.  She had been on gabapentin  100 mg at bedtime, but discontinued that on her own. She saw Dr. [name] in neurology in October 2024 and underwent repeat EMG, which was more abnormal. She has been seen by Dr. [name] with [name] in [place], who did not feel surgery was a good option. The patient no longer sees occupational therapy, but does exercises at home.  Epiphora She is very frustrated by this. She informed me that she was told about a procedure where they can place stents(pyrex tubes) in her eyes tear ducts to help with her excessive tearing. She has made an appointment with her ophthalmologist, Dr.  [employer] in [place].  I encouraged her to pursue this as we know certain agents such as docetaxel  can cause pannicular stenosis of the tear ducts.  I am not sure if her problem is from her prior docetaxel   or whether the current treatment  with Enhertu  is also a culprit, probably both.   Plan: She has now been on Enhertu  for over 2 years with excellent response. She has recently seen her ophthalmologist, Dr. Hughie Bail in [place] and was given a new eye routine to help with the tearing of her eyes and discussed the pyrex tube placement in her tear ducts. Her day 1 cycle 37 of Enhertu  is scheduled on 02/22/2024. I will reduce this dose by 10% from 200 mg to 180 mg total partially due to her weight loss but also increased toxicities. She has a WBC of 5.3, hemoglobin of 12.5, and platelet count of 213,000. Her CMP is normal other an elevated BUN of 25, calcium of 10.8, and AST of 46. She is scheduled for repeat CT chest, abdomen, and pelvis on October 30th. I will see her back in 3 weeks with CBC and CMP. The patient understands the plans discussed today and is in agreement with them.  She knows to contact our office if she develops concerns prior to her next visit.   I provided 11 minutes of face-to-face time during this encounter and > 50% was spent counseling as documented under my assessment and plan.   [name], MD  [facility] - A DEPT OF [name] [address] [place] [address] Dept: [phone] Dept Fax: [phone]   [name].  CHIEF COMPLAINT:  CC: Recurrent HER2 receptor positive breast cancer  Current Treatment: Enhertu  every 3 weeks  HISTORY OF PRESENT ILLNESS:  Jasmin Lewis is a 89 year old woman with recurrent HER2 receptor positive. She was diagnosed with stage IIB (T2 N1 M0) HER2 receptor positive and hormone receptor negative left breast cancer in June 2020 when she was able to palpate a mass of the left breast. Mammogram and ultrasound in May 2020 revealed a 2.9 cm irregular mass in the upper outer left breast as well as two adjacent abnormal left axillary lymph nodes. Biopsy  was obtained on June 3rd and revealed invasive ductal carcinoma and DCIS with lymphovascular invasion. Left axillary lymph node was positive for invasive ductal carcinoma. HER2 was positive 3+. Estrogen and progesterone receptors were both negative at 0%, and Ki67 was 70%. Breast MRI from July revealed a 3.3 cm mass of the lateral portion of the left breast as well as significant non mass enhancement surrounding this mass and extending anteriorly into the nipple base, with largest diameter in the anterior to posterior axis, measuring 7.2 centimeters. Three enlarged left axillary lymph nodes. Right breast was negative.  PET imaging in July revealed no evidence of additional metastatic involvement. She received neoadjuvant trastuzumab /pertuzumab  for 6 cycles. Repeat breast MRI in October revealed complete resolution of the enhancing mass and associated non mass enhancement within the left breast. Previously identified left axillary lymphadenopathy was no longer identified. She underwent left mastectomy and targeted lymph node dissection in November 2020.  Pathology revealed evidence of malignancy within the breast and three sentinel lymph nodes were negative for malignancy (0/3). She continued maintenance trastuzumab  injections for two more doses, but this was discontinued in December 2020 as she refused further ECHO scans.  Of note, she also has a history of stage II colon cancer, diagnosed in May 2007. Preop CEA was 5.2. She underwent surgical resection and surgical pathology revealed a 5.9 x 3.8 x 1.2 cm moderate to well-differentiated adenocarcinoma penetrating the muscular wall. Forty lymph nodes negative. No vascular or lymphatic invasion. Multiple diverticula with microabscess formation and inflammation. Carcinoma present in at least 1 diverticulum. She received adjuvant chemotherapy with capecitabine for 9 months. Colonoscopy from August 2013 revealed 2 polyps which were resected and chronic  diverticulosis.  In December 2022, she was evaluated at the [facility] ED for abdominal and back pain.  CT abdomen pelvis revealed new lytic lesion involving the L2 vertebral body concerning for metastatic disease, as well as multiple hepatic rim-enhancing fluid collections with significant surrounding edema and an additional subtle 1.5 cm hypodensity in the hepatic dome. Given clinical history and presence of a new L2 lesion, leading differential consideration is metastatic disease. MRI abdomen in revealed numerous heterogeneously enhancing, internally necrotic lesions in the right hepatic lobe, highly concerning for metastatic disease. Largest lesion measures up to 6.7 cm in hepatic segment VI. Enhancing osseous lesions in the L2 vertebral body, both iliac bones, and in the sacrum, highly concerning for osseous metastases was also seen.    PET scan revealed widespread hypermetabolic disease in the left chest wall, left axillary, subpectoral and supraclavicular nodes, liver, left adrenal and bone.  MRI thoracic/lumbar spine revealed widespread osseous metastasis with the largest deposit replacing L2 vertebral body where there was a compression fracture with mild height loss and early epidural tumor extension affecting L2-3 foramina.  MRI brain was negative for metastatic disease.  Biopsy of of the left chest wall  was consistent with recurrent ductal carcinoma of the breast.  HER2 was positive, estrogen and progesterone receptors were negative.  She was initially treated with trastuzumab , as well as zoledronic  acid, beginning in January 2023 with an initial response.   Oncology History Overview Note  Cancer Staging Malignant neoplasm of upper-outer quadrant of left breast in female, estrogen receptor negative ([name]) Staging form: Breast, AJCC 8th Edition - Clinical stage from 10/11/2018: Stage IIB (cT2, cN1, cM0, G3, ER-, PR-, HER2+) - Signed by Lanny Callander, MD on 11/02/2018 - Clinical: No stage assigned -  Unsigned    History of colon cancer, stage II  09/2005 Initial Diagnosis   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 Findings were a 5.9 x 3.8 x 1.2 cm moderate to well-differentiated adenocarcinoma penetrating the muscular wall. Forty lymph nodes negative. No vascular or lymphatic invasion. Multiple diverticula with microabscess formation and inflammation. Carcinoma present in at least 1 diverticulum. Preop CEA 5.2 with lab normal range 0 to 2.5. Preop CT scan with no obvious additional pathology.    2007 -  Chemotherapy   She received oral Xeloda chemotherapy as an adjuvant. She declined treatment on a clinical trial.    11/12/2011 Initial Diagnosis   [name], stage II   12/09/2011 Procedure   Followup colonoscopy done on 12/09/2011. She was found to have 2 polyps which were removed. Chronic diverticulosis.     Malignant neoplasm of upper-outer quadrant of left breast in female, estrogen receptor negative ([name])  09/26/2018 Mammogram   Mammogram/US  of left breast 09/26/18 IMPRESSION:  1. 2.9cm irregular mass in the upper outer left breast corresponds to the palpable abnormality. This is highly suspicious for breast carcinoma.  2. Two  adjacent abnormal left axillary  Lymph nodes suspicious for metastatic adenopathy. There is a third borderline abnormal left axillary LN with a cortex thickened to 4mm.  3. Benign right breast cyst. No evidence of right breast malignancy.    10/11/2018 Cancer Staging   Staging form: Breast, AJCC 8th Edition - Clinical stage from 10/11/2018: Stage IIB (cT2, cN1, cM0, G3, ER-, PR-, HER2+) - Signed by Lanny Callander, MD on 11/02/2018   10/11/2018 Initial Biopsy   Diagnosis 10/11/18 1. Breast, left, needle core biopsy, upper outer left 2 o'clock - INVASIVE DUCTAL CARCINOMA. - DUCTAL CARCINOMA IN SITU. - LYMPHOVASCULAR INVASION IS IDENTIFIED. - SEE COMMENT. 2. Lymph  node, needle/core biopsy, left axilla - INVASIVE DUCTAL CARCINOMA. - SEE COMMENT.   10/11/2018 Receptors her2   The tumor cells are POSITIVE for Her2 (3+). Estrogen Receptor: 0%, NEGATIVE Progesterone Receptor: 0%, NEGATIVE Proliferation Marker Ki67: 70%   11/02/2018 Initial Diagnosis   Malignant neoplasm of upper-outer quadrant of left breast in female, estrogen receptor negative ([name])   11/15/2018 Breast MRI   MRI breast 11/15/18  IMPRESSION: 1. 3.3 centimeter mass in the LATERAL portion of the LEFT breast consistent with known malignancy. 2. There is significant non mass enhancement surrounding this mass and extending anteriorly into the nipple base, with largest diameter in the anterior to posterior axis, measuring 7.2 centimeters. 3. If the patient would consider breast conservation, additional MR guided core biopsies are recommended. Consider biopsy of the inferior and anterior extent of the non mass enhancement to document extent of disease. 4. Three enlarged LEFT axillary lymph nodes. 5. RIGHT breast is negative.   11/15/2018 PET scan   PET 11/15/18 IMPRESSION: Hypermetabolic left breast lesion compatible with known primary. Hypermetabolic left axillary lymph nodes are consistent with metastatic disease.   No evidence for additional hypermetabolic metastatic involvement in the neck, chest, abdomen, or pelvis.   11/17/2018 - 03/01/2019 Chemotherapy   Neo-adjuvant Kadcyla  and perjeta  q3weeks for 6 cycles starting 11/17/18. Stopped before surgery    11/29/2018 Pathology Results   Diagnosis 1. Breast, left, needle core biopsy, inferior anterior (cylinder clip) - INVASIVE DUCTAL CARCINOMA. - DUCTAL CARCINOMA IN SITU. - LYMPHOVASCULAR INVASION IS IDENTIFIED. - SEE COMMENT. 2. Breast, left, needle core biopsy, central posterior (barbell clip) - INVASIVE DUCTAL CARCINOMA. - LYMPHOVASCULAR INVASION IS IDENTIFIED.   02/12/2019 Breast MRI   IMPRESSION: 1. Complete resolution of  previously identified enhancing mass and associated non mass enhancement within the left breast. This is consistent with excellent response to chemotherapy. No residual or suspicious findings are identified. 2. No MRI evidence of malignancy on the right. 3. Previously identified left axillary lymphadenopathy not definitively seen on today's study. However, this may be due to decreased field-of-view compared to prior study.     03/14/2019 Cancer Staging   Staging form: Breast, AJCC 8th Edition - Pathologic stage from 03/14/2019: pT0, pN0, cM0, GX, ER: Unknown, PR: Unknown, HER2: Not Assessed - Signed by Lanny Callander, MD on 03/28/2019   03/14/2019 Surgery   LEFT MASTECTOMY WITH TARGETED LYMPH NODE DISSECTION and Left Axillary Sentinel Lymph  Node Biopsy by Dr Curvin 03/14/19    03/14/2019 Pathology Results   FINAL MICROSCOPIC DIAGNOSIS:   A. LYMPH NODE, LEFT, SENTINEL, BIOPSY:  - There is no evidence of carcinoma in 1 of 1 lymph node (0/1).   B. BREAST, LEFT, MASTECTOMY:  - Benign breast parenchyma with treatment-related changes.  - There is no evidence of malignancy.  - See oncology table below.   C. LYMPH  NODE, LEFT #1, SENTINEL, BIOPSY:  - There is no evidence of carcinoma in 1 of 1 lymph node (0/1).   D. LYMPH NODE, LEFT #2, SENTINEL, BIOPSY:  - There is no evidence of carcinoma in 1 of 1 lymph node (0/1).     04/04/2019 - 04/25/2019 Chemotherapy   Maintenance Herceptin  injections every 3 weeks starting 04/03/19 to complete 1 year of treatment that was started in 11/2018. Stopped after 2nd dose as she will not be repeating Echos.    06/03/2021 - 06/24/2021 Chemotherapy   Patient is on Treatment Plan : BREAST Trastuzumab  q21d X 11 Cycles     06/25/2021 - 01/01/2022 Chemotherapy   Patient is on Treatment Plan : BREAST Docetaxel  + Trastuzumab  + Pertuzumab  (THP) q21d     06/25/2021 - 01/02/2022 Chemotherapy   Patient is on Treatment Plan : BREAST Docetaxel  + Trastuzumab  + Pertuzumab  (THP)  q21d     09/14/2021 Imaging   CT chest/abdomen/pelvis  IMPRESSION:  1. Interval resolution of previously seen bulky left axillary and  subpectoral lymphadenopathy. No persistently enlarged lymph nodes.  2. Multiple liver lesions are significantly diminished in size.  3. Widespread osseous metastatic disease, with an interval increase  in sclerosis of several previously lytic lesions.  4. Constellation of findings is consistent with treatment response  of metastatic disease  5. New, although age indeterminate pathologic wedge deformity of the  T6 vertebral body as well as an increased pathologic wedge deformity  of the L2 vertebral body.  6. Coronary artery disease.  Aortic Atherosclerosis (ICD10-I70.0).    01/26/2022 -  Chemotherapy   Patient is on Treatment Plan : BREAST METASTATIC Fam-Trastuzumab Deruxtecan-nxki  (Enhertu ) (5.4) q21d     Malignant neoplasm metastatic to liver ([name])  04/17/2021 Imaging   CT ABDOMEN AND PELVIS WITH CONTRAST: -New lytic lesion involving the L2 vertebral body (6:72, 2:26) with minimal cortical breakthrough superiorly, but with preservation of vertebral body height, concerning for metastatic disease.  -There are several indeterminate hepatic lesions as described above. In addition to the lesions described above, there is an additional subtle 1.5 cm hypodensity in the hepatic dome (2:7). Given clinical history and presence of a new L2 lesion, leading differential consideration is metastatic disease. Recommend further evaluation with MRI of the abdomen with and without contrast.   05/12/2021 Imaging   MRI ABDOMEN WITH AND WITHOUT CONTRAST: Numerous heterogeneously enhancing, internally necrotic lesions in the right hepatic lobe, highly concerning for metastatic disease. Largest lesion measures up to 6.7 cm in hepatic segment VI.   Enhancing osseous lesions in the L2 vertebral body, both iliac bones, and in the sacrum, highly concerning for osseous metastases.    05/15/2021 Initial Diagnosis   Liver metastases ([name])   05/27/2021 PET scan   1. Widespread recurrent/metastatic disease in this patient who is  status post bilateral mastectomy. Left chest wall recurrence with  left axillary/subpectoral, supraclavicular nodal metastasis.  2. Hepatic, left adrenal, and widespread osseous metastasis.  3. Incidental findings, including: Right nephrolithiasis. Tiny  hiatal hernia.    05/27/2021 Imaging   MRI LUMBAR AND THORACIC SPINE: Thoracic spine:  Osseous metastatic disease involving each level. The most dramatic deposits are at T4 and T6 where there is extraosseous/epidural tumor. No cord compression but there is foraminal impingement on the left at T6-7 and on the right at T3-4. Early dorsal epidural tumor at the level of T10 and T3.   Lumbar spine:  1. Widespread osseous metastatic disease with largest deposit  replacing the L2 body where  there is a compression fracture with  mild height loss. Also at this level is early epidural tumor  extension likely affecting both L2-3 foramina.  2. 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    05/27/2021 Imaging   MRI HEAD WITH AND WITHOUT CONTRAST: Negative for metastatic disease to the brain or calvarium.   06/03/2021 - 06/24/2021 Chemotherapy   Patient is on Treatment Plan : BREAST Trastuzumab  q21d X 11 Cycles     06/25/2021 - 01/01/2022 Chemotherapy   Patient is on Treatment Plan : BREAST Docetaxel  + Trastuzumab  + Pertuzumab  (THP) q21d     06/25/2021 - 01/02/2022 Chemotherapy   Patient is on Treatment Plan : BREAST Docetaxel  + Trastuzumab  + Pertuzumab  (THP) q21d     09/14/2021 Imaging   CT chest/abdomen/pelvis  IMPRESSION:  1. Interval resolution of previously seen bulky left axillary and  subpectoral lymphadenopathy. No persistently enlarged lymph nodes.  2. Multiple liver lesions are significantly diminished in size.  3. Widespread osseous metastatic disease, with an interval  increase  in sclerosis of several previously lytic lesions.  4. Constellation of findings is consistent with treatment response  of metastatic disease  5. New, although age indeterminate pathologic wedge deformity of the  T6 vertebral body as well as an increased pathologic wedge deformity  of the L2 vertebral body.  6. Coronary artery disease.  Aortic Atherosclerosis (ICD10-I70.0).    01/26/2022 -  Chemotherapy   Patient is on Treatment Plan : BREAST METASTATIC Fam-Trastuzumab Deruxtecan-nxki  (Enhertu ) (5.4) q21d     Malignant neoplasm metastatic to bone ([name])  04/17/2021 Imaging   CT ABDOMEN AND PELVIS WITH CONTRAST: -New lytic lesion involving the L2 vertebral body (6:72, 2:26) with minimal cortical breakthrough superiorly, but with preservation of vertebral body height, concerning for metastatic disease.  -There are several indeterminate hepatic lesions as described above. In addition to the lesions described above, there is an additional subtle 1.5 cm hypodensity in the hepatic dome (2:7). Given clinical history and presence of a new L2 lesion, leading differential consideration is metastatic disease. Recommend further evaluation with MRI of the abdomen with and without contrast.   05/12/2021 Imaging   MRI ABDOMEN WITH AND WITHOUT CONTRAST: Numerous heterogeneously enhancing, internally necrotic lesions in the right hepatic lobe, highly concerning for metastatic disease. Largest lesion measures up to 6.7 cm in hepatic segment VI.   Enhancing osseous lesions in the L2 vertebral body, both iliac bones, and in the sacrum, highly concerning for osseous metastases.   05/15/2021 Initial Diagnosis   Bone metastases ([name])   05/27/2021 PET scan   1. Widespread recurrent/metastatic disease in this patient who is  status post bilateral mastectomy. Left chest wall recurrence with  left axillary/subpectoral, supraclavicular nodal metastasis.  2. Hepatic, left adrenal, and widespread osseous  metastasis.  3. Incidental findings, including: Right nephrolithiasis. Tiny  hiatal hernia.    05/27/2021 Imaging   MRI LUMBAR AND THORACIC SPINE: Thoracic spine:  Osseous metastatic disease involving each level. The most dramatic deposits are at T4 and T6 where there is extraosseous/epidural tumor. No cord compression but there is foraminal impingement on the left at T6-7 and on the right at T3-4. Early dorsal epidural tumor at the level of T10 and T3.   Lumbar spine:  1. Widespread osseous metastatic disease with largest deposit  replacing the L2 body where there is a compression fracture with  mild height loss. Also at this level is early epidural tumor  extension likely affecting both L2-3 foramina.  2.  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    05/27/2021 Imaging   MRI HEAD WITH AND WITHOUT CONTRAST: Negative for metastatic disease to the brain or calvarium.   06/03/2021 - 06/24/2021 Chemotherapy   Patient is on Treatment Plan : BREAST Trastuzumab  q21d X 11 Cycles     06/25/2021 - 01/01/2022 Chemotherapy   Patient is on Treatment Plan : BREAST Docetaxel  + Trastuzumab  + Pertuzumab  (THP) q21d     06/25/2021 - 01/02/2022 Chemotherapy   Patient is on Treatment Plan : BREAST Docetaxel  + Trastuzumab  + Pertuzumab  (THP) q21d     09/14/2021 Imaging   CT chest/abdomen/pelvis  IMPRESSION:  1. Interval resolution of previously seen bulky left axillary and  subpectoral lymphadenopathy. No persistently enlarged lymph nodes.  2. Multiple liver lesions are significantly diminished in size.  3. Widespread osseous metastatic disease, with an interval increase  in sclerosis of several previously lytic lesions.  4. Constellation of findings is consistent with treatment response  of metastatic disease  5. New, although age indeterminate pathologic wedge deformity of the  T6 vertebral body as well as an increased pathologic wedge deformity  of the L2 vertebral body.  6.  Coronary artery disease.  Aortic Atherosclerosis (ICD10-I70.0).    01/26/2022 -  Chemotherapy   Patient is on Treatment Plan : BREAST METASTATIC Fam-Trastuzumab Deruxtecan-nxki  (Enhertu ) (5.4) q21d         INTERVAL HISTORY:  Jasmin Lewis is here today for repeat clinical assessment for her recurrent HER2 receptor positive breast cancer, previously treated with a Taxane and Herceptin . When she progressed she was switched to Enhertu  and has done extremely well for 2 years. Patient states that she feels well but complains of intermittent left hand pain and atrophy. She recently seen her ophthalmologist, Dr. Hughie Bail in [place] and was given a new eye routine to help with the tearing of her eyes and discuss the pyrex tube placement in her tear ducts. Her day 1 cycle 37 of Enhertu  is scheduled on 02/22/2024. I will reduce this dose by 10% from 200mg  to 180mg  total partially due to her weight loss but also increased toxicities. She has a WBC of 5.3, hemoglobin of 12.5, and platelet count of 213,000. Her CMP is normal other an elevated BUN of 25, calcium of 10.8, and AST of 46. She is scheduled for repeat CT chest, abdomen, and pelvis on October 30th. I will see her back in 3 weeks with CBC and CMP.  She denies fever, chills, night sweats, or other signs of infection. She denies cardiorespiratory and gastrointestinal issues. Her appetite is on and off and Her weight has decreased 2 pounds over last 3 weeks.   REVIEW OF SYSTEMS:   Review of Systems  Constitutional:  Positive for appetite change (on and off). Negative for chills, fatigue, fever and unexpected weight change.  HENT:  Negative.  Negative for lump/mass, mouth sores and sore throat.   Eyes:  Positive for eye problems (excessive tearing).  Respiratory: Negative.  Negative for chest tightness, cough, hemoptysis, shortness of breath and wheezing.   Cardiovascular: Negative.  Negative for chest pain, leg swelling and palpitations.   Gastrointestinal: Negative.  Negative for abdominal distention, abdominal pain, blood in stool, constipation, diarrhea, nausea and vomiting.  Endocrine: Negative.  Negative for hot flashes.  Genitourinary: Negative.  Negative for difficulty urinating, dysuria, frequency and hematuria.   Musculoskeletal:  Positive for arthralgias and gait problem (uses a cane, occasionally). Negative for back pain, flank pain and myalgias.  Skin:  Negative.  Negative for rash.  Neurological:  Positive for extremity weakness (atrophy and severe left hand weakness) and gait problem (uses a cane, occasionally). Negative for dizziness, headaches, light-headedness, numbness, seizures and speech difficulty.  Hematological: Negative.  Negative for adenopathy. Does not bruise/bleed easily.  Psychiatric/Behavioral: Negative.  Negative for depression and sleep disturbance. The patient is not nervous/anxious.     VITALS:  Blood pressure (!) 148/70, pulse 81, temperature 97.9 F (36.6 C), temperature source Oral, resp. rate 18, height 5' 0.25 (1.53 m), weight 105 lb 9.6 oz (47.9 kg), SpO2 97%.  Wt Readings from Last 3 Encounters:  02/22/24 105 lb 9.6 oz (47.9 kg)  02/01/24 107 lb 1.6 oz (48.6 kg)  01/11/24 108 lb 14.4 oz (49.4 kg)    Body mass index is 20.45 kg/m.  Performance status (ECOG): 1 - Symptomatic but completely ambulatory  PHYSICAL EXAM:   Physical Exam Vitals and nursing note reviewed.  Constitutional:      General: She is not in acute distress.    Appearance: Normal appearance. She is normal weight. She is not ill-appearing, toxic-appearing or diaphoretic.  HENT:     Head: Normocephalic and atraumatic.     Right Ear: Tympanic membrane, ear canal and external ear normal. There is no impacted cerumen.     Left Ear: Tympanic membrane, ear canal and external ear normal. There is no impacted cerumen.     Nose: Nose normal. No congestion or rhinorrhea.     Mouth/Throat:     Mouth: Mucous membranes are  moist.     Pharynx: Oropharynx is clear. No oropharyngeal exudate or posterior oropharyngeal erythema.  Eyes:     General: No scleral icterus.       Right eye: No discharge.        Left eye: No discharge.     Extraocular Movements: Extraocular movements intact.     Conjunctiva/sclera: Conjunctivae normal.     Pupils: Pupils are equal, round, and reactive to light.     Comments: Excessive tearing  Neck:     Vascular: No carotid bruit.  Cardiovascular:     Rate and Rhythm: Normal rate and regular rhythm.     Pulses: Normal pulses.     Heart sounds: Normal heart sounds. No murmur heard.    No friction rub. No gallop.  Pulmonary:     Effort: Pulmonary effort is normal. No respiratory distress.     Breath sounds: Normal breath sounds. No stridor. No wheezing, rhonchi or rales.  Chest:     Chest wall: No tenderness.  Breasts:    Right: Normal.     Left: Normal.     Comments: Left chest wall is negative. Right breast is without masses Abdominal:     General: Bowel sounds are normal. There is no distension.     Palpations: Abdomen is soft. There is no hepatomegaly, splenomegaly or mass.     Tenderness: There is no abdominal tenderness. There is no right CVA tenderness, left CVA tenderness, guarding or rebound.     Hernia: No hernia is present.  Musculoskeletal:        General: No swelling, tenderness, deformity or signs of injury. Normal range of motion.     Right hand: Normal.     Left hand: Decreased strength.     Cervical back: Normal range of motion and neck supple. No rigidity or tenderness.     Right lower leg: No edema.     Left lower leg: No edema.  Comments: Wasting of the left hand  Lymphadenopathy:     Cervical: No cervical adenopathy.     Right cervical: No superficial, deep or posterior cervical adenopathy.    Left cervical: No superficial, deep or posterior cervical adenopathy.     Upper Body:     Right upper body: No supraclavicular, axillary or pectoral  adenopathy.     Left upper body: No supraclavicular, axillary or pectoral adenopathy.  Skin:    General: Skin is warm and dry.     Coloration: Skin is not jaundiced or pale.     Findings: No bruising, erythema, lesion or rash.  Neurological:     General: No focal deficit present.     Mental Status: She is alert and oriented to person, place, and time. Mental status is at baseline.     Cranial Nerves: No cranial nerve deficit.     Sensory: No sensory deficit.     Motor: No weakness.     Coordination: Coordination normal.     Gait: Gait normal.     Deep Tendon Reflexes: Reflexes normal.  Psychiatric:        Mood and Affect: Mood normal.        Behavior: Behavior normal.        Thought Content: Thought content normal.        Judgment: Judgment normal.    LABS:      Latest Ref Rng & Units 02/22/2024    1:14 PM 02/01/2024    1:38 PM 01/11/2024    1:08 PM  CBC  WBC 4.0 - 10.5 K/uL 5.3  5.6  4.4   Hemoglobin 12.0 - 15.0 g/dL 87.4  87.3  87.1   Hematocrit 36.0 - 46.0 % 36.4  36.4  37.3   Platelets 150 - 400 K/uL 213  151  163       Latest Ref Rng & Units 02/22/2024    1:14 PM 02/01/2024    1:38 PM 01/11/2024    1:08 PM  CMP  Glucose 70 - 99 mg/dL 898  897  97   BUN 8 - 23 mg/dL 25  21  15    Creatinine 0.44 - 1.00 mg/dL 9.15  9.21  9.25   Sodium 135 - 145 mmol/L 143  142  138   Potassium 3.5 - 5.1 mmol/L 4.5  4.4  4.4   Chloride 98 - 111 mmol/L 107  107  105   CO2 22 - 32 mmol/L 25  24  23    Calcium 8.9 - 10.3 mg/dL 89.1  89.6  89.8   Total Protein 6.5 - 8.1 g/dL 7.1  6.9  7.0   Total Bilirubin 0.0 - 1.2 mg/dL 0.4  0.4  0.6   Alkaline Phos 38 - 126 U/L 69  66  65   AST 15 - 41 U/L 46  38  39   ALT 0 - 44 U/L 20  17  19     Lab Results  Component Value Date   CEA1 1.8 06/03/2021   CEA 0.5 11/13/2012   CEA  Date Value Ref Range Status  06/03/2021 1.8 0.0 - 4.7 ng/mL Final    Comment:    (NOTE)                             Nonsmokers          <3.9  Smokers             <5.6 [employer] Electrochemiluminescence Immunoassay (ECLIA) Values obtained with different assay methods or kits cannot be used interchangeably.  Results cannot be interpreted as absolute evidence of the presence or absence of malignant disease. Performed At: [facility] [address] [place], [place] [id number] Jennette Shorter MD Ey:[id number]   11/13/2012 0.5 0.0 - 5.0 ng/mL Final   Lab Results  Component Value Date   LDH 186 11/12/2011   LDH 187 09/08/2010   LDH 171 09/09/2009   STUDIES:  EXAM: 08/11/2023 CT CHEST, ABDOMEN, AND PELVIS WITH CONTRAST IMPRESSION: Multifocal sclerotic bone metastases are similar to previous. Stable areas of compression deformity at L2 and T6. Separate healing right lateral rib fracture. Left adrenal nodule appears slightly larger today. Attention on follow-up. No developing new lymph node enlargement, soft tissue mass. Fatty liver infiltration with slightly nodular contour. Vague low-attenuation liver lesion on previous is not well seen today. No new liver lesion at this time.   HISTORY:   Past Medical History:  Diagnosis Date   Arthritis    shoulder   Breast cancer ([name])    Cancer ([name])    left breast ca   Colon cancer ([name])    [name], stage II 11/12/2011   Sigmoid lesion 5.9 cm  40 nodes negative but focus of cancer in a diverticum   Pre-op CEA 5.2 with lab normal up to 2.5 resected 09/21/05   Xeloda adjuvant chemotherapy   Hypertension    Polyneuropathy 02/03/2023   Right shoulder pain 04/12/2023    Past Surgical History:  Procedure Laterality Date   COLONOSCOPY     HEMICOLECTOMY  2007   MASTECTOMY WITH AXILLARY LYMPH NODE DISSECTION Left 03/14/2019   Procedure: LEFT MASTECTOMY WITH TARGETED LYMPH NODE DISSECTION;  Surgeon: Curvin Deward MOULD, MD;  Location: [facility] OR;  Service: General;  Laterality: Left;   SENTINEL NODE BIOPSY Left 03/14/2019   Procedure: Left Axillary Sentinel Lymph   Node Biopsy;  Surgeon: Curvin Deward MOULD, MD;  Location: [facility];  Service: General;  Laterality: Left;   TONSILLECTOMY     WISDOM TOOTH EXTRACTION      Family History  Problem Relation Age of Onset   Cancer Maternal Aunt        colon cancer     Social History:  reports that she has never smoked. She has never used smokeless tobacco. She reports that she does not drink alcohol and does not use drugs.The patient is alone today.  Allergies: No Known Allergies  Current Medications: Current Outpatient Medications  Medication Sig Dispense Refill   neomycin-polymyxin b-dexamethasone  (MAXITROL) 3.5-10000-0.1 SUSP SMARTSIG:In Eye(s)     Biotin 1 MG CAPS Take by mouth.     bisacodyl (DULCOLAX) 5 MG EC tablet Take 5 mg by mouth daily as needed for moderate constipation. 2 capsules every night     co-enzyme Q-10 30 MG capsule Take 100 mg by mouth daily.     fish oil-omega-3 fatty acids 1000 MG capsule Take 1 g by mouth daily.     MAGNESIUM PO Take 1 tablet by mouth every evening. 600 mg     Multiple Vitamin (MULTIVITAMIN) capsule Take 1 capsule by mouth daily.     ondansetron  (ZOFRAN ) 4 MG tablet Take 1 tablet (4 mg total) by mouth every 8 (eight) hours as needed for nausea or vomiting. (Patient not taking: Reported on 01/11/2024) 20 tablet 2   polyethylene glycol (MIRALAX / GLYCOLAX)  17 g packet Take 17 g by mouth as needed. constipation     triamcinolone  cream (KENALOG ) 0.1 % Apply 1 Application topically 2 (two) times daily. (Patient taking differently: Apply 1 Application topically 2 (two) times daily as needed.) 30 g 1   No current facility-administered medications for this visit.    I,Jasmine M Lassiter,acting as a scribe for [name], MD.,have documented all relevant documentation on the behalf of [name], MD,as directed by  [name], MD while in the presence of [name], MD.

## 2024-02-22 NOTE — Patient Instructions (Signed)
 Fam-Trastuzumab  Deruxtecan Injection What is this medication? FAM-TRASTUZUMAB  DERUXTECAN (fam-tras TOOZ eu mab DER ux TEE kan) treats some types of cancer. It works by blocking a protein that causes cancer cells to grow and multiply. This helps to slow or stop the spread of cancer cells. This medicine may be used for other purposes; ask your health care provider or pharmacist if you have questions. COMMON BRAND NAME(S): ENHERTU  What should I tell my care team before I take this medication? They need to know if you have any of these conditions: Heart disease Heart failure Infection, especially a viral infection, such as chickenpox, cold sores, or herpes Liver disease Lung or breathing disease, such as asthma or COPD An unusual or allergic reaction to fam-trastuzumab  deruxtecan, other medications, foods, dyes, or preservatives Pregnant or trying to get pregnant Breast-feeding How should I use this medication? This medication is injected into a vein. It is given by your care team in a hospital or clinic setting. A special MedGuide will be given to you before each treatment. Be sure to read this information carefully each time. Talk to your care team about the use of this medication in children. Special care may be needed. Overdosage: If you think you have taken too much of this medicine contact a poison control center or emergency room at once. NOTE: This medicine is only for you. Do not share this medicine with others. What if I miss a dose? It is important not to miss your dose. Call your care team if you are unable to keep an appointment. What may interact with this medication? Interactions are not expected. This list may not describe all possible interactions. Give your health care provider a list of all the medicines, herbs, non-prescription drugs, or dietary supplements you use. Also tell them if you smoke, drink alcohol , or use illegal drugs. Some items may interact with your  medicine. What should I watch for while using this medication? Visit your care team for regular checks on your progress. Tell your care team if your symptoms do not start to get better or if they get worse. This medication may increase your risk of getting an infection. Call your care team for advice if you get a fever, chills, sore throat, or other symptoms of a cold or flu. Do not treat yourself. Try to avoid being around people who are sick. Avoid taking medications that contain aspirin, acetaminophen , ibuprofen, naproxen, or ketoprofen unless instructed by your care team. These medications may hide a fever. Be careful brushing or flossing your teeth or using a toothpick because you may get an infection or bleed more easily. If you have any dental work done, tell your dentist you are receiving this medication. This medication may cause dry eyes and blurred vision. If you wear contact lenses, you may feel some discomfort. Lubricating eye drops may help. See your care team if the problem does not go away or is severe. Talk to your care team if you may be pregnant. Serious birth defects can occur if you take this medication during pregnancy and for 7 months after the last dose. If your partner can get pregnant, use a condom during sex while taking this medication and for 4 months after the last dose. Do not breastfeed while taking this medication and for 7 months after the last dose. This medication may cause infertility. Talk to your care team if you are concerned about your fertility. What side effects may I notice from receiving this medication? Side effects  that you should report to your care team as soon as possible: Allergic reactions--skin rash, itching, hives, swelling of the face, lips, tongue, or throat Dry cough, shortness of breath or trouble breathing Infection--fever, chills, cough, sore throat, wounds that don't heal, pain or trouble when passing urine, general feeling of discomfort or  being unwell Heart failure--shortness of breath, swelling of the ankles, feet, or hands, sudden weight gain, unusual weakness or fatigue Unusual bruising or bleeding Side effects that usually do not require medical attention (report these to your care team if they continue or are bothersome): Constipation Diarrhea Hair loss Muscle pain Nausea Vomiting This list may not describe all possible side effects. Call your doctor for medical advice about side effects. You may report side effects to FDA at 1-800-FDA-1088. Where should I keep my medication? This medication is given in a hospital or clinic. It will not be stored at home. NOTE: This sheet is a summary. It may not cover all possible information. If you have questions about this medicine, talk to your doctor, pharmacist, or health care provider.  2024 Elsevier/Gold Standard (2022-12-24 00:00:00)

## 2024-02-22 NOTE — Progress Notes (Signed)
 Reduce dose of fam-trastuzumab  to 180mg  per Dr. Cornelius due to patient losing weight and having difficulty tolerating the 200mg  dose.

## 2024-02-27 ENCOUNTER — Encounter: Payer: Self-pay | Admitting: Oncology

## 2024-03-07 ENCOUNTER — Encounter: Payer: Self-pay | Admitting: Oncology

## 2024-03-08 ENCOUNTER — Inpatient Hospital Stay (HOSPITAL_BASED_OUTPATIENT_CLINIC_OR_DEPARTMENT_OTHER)
Admission: RE | Admit: 2024-03-08 | Discharge: 2024-03-08 | Disposition: A | Source: Ambulatory Visit | Attending: Oncology | Admitting: Oncology

## 2024-03-08 DIAGNOSIS — C787 Secondary malignant neoplasm of liver and intrahepatic bile duct: Secondary | ICD-10-CM

## 2024-03-08 MED ORDER — IOHEXOL 300 MG/ML  SOLN
100.0000 mL | Freq: Once | INTRAMUSCULAR | Status: AC | PRN
  Administered 2024-03-08: 100 mL via INTRAVENOUS

## 2024-03-08 MED ORDER — HEPARIN SOD (PORK) LOCK FLUSH 100 UNIT/ML IV SOLN
500.0000 [IU] | Freq: Once | INTRAVENOUS | Status: AC
  Administered 2024-03-08: 500 [IU] via INTRAVENOUS

## 2024-03-14 ENCOUNTER — Inpatient Hospital Stay: Admitting: Oncology

## 2024-03-14 ENCOUNTER — Inpatient Hospital Stay

## 2024-03-14 ENCOUNTER — Encounter: Payer: Self-pay | Admitting: Oncology

## 2024-03-14 ENCOUNTER — Other Ambulatory Visit: Payer: Self-pay

## 2024-03-14 ENCOUNTER — Inpatient Hospital Stay: Attending: Hematology and Oncology

## 2024-03-14 ENCOUNTER — Other Ambulatory Visit: Payer: Self-pay | Admitting: Oncology

## 2024-03-14 VITALS — BP 153/75 | HR 77 | Temp 98.0°F | Resp 18 | Ht 60.25 in | Wt 106.8 lb

## 2024-03-14 DIAGNOSIS — Z1722 Progesterone receptor negative status: Secondary | ICD-10-CM | POA: Insufficient documentation

## 2024-03-14 DIAGNOSIS — Z85038 Personal history of other malignant neoplasm of large intestine: Secondary | ICD-10-CM | POA: Insufficient documentation

## 2024-03-14 DIAGNOSIS — Z171 Estrogen receptor negative status [ER-]: Secondary | ICD-10-CM

## 2024-03-14 DIAGNOSIS — C7951 Secondary malignant neoplasm of bone: Secondary | ICD-10-CM

## 2024-03-14 DIAGNOSIS — Z9012 Acquired absence of left breast and nipple: Secondary | ICD-10-CM | POA: Diagnosis not present

## 2024-03-14 DIAGNOSIS — C787 Secondary malignant neoplasm of liver and intrahepatic bile duct: Secondary | ICD-10-CM | POA: Insufficient documentation

## 2024-03-14 DIAGNOSIS — M6250 Muscle wasting and atrophy, not elsewhere classified, unspecified site: Secondary | ICD-10-CM | POA: Insufficient documentation

## 2024-03-14 DIAGNOSIS — I972 Postmastectomy lymphedema syndrome: Secondary | ICD-10-CM | POA: Diagnosis not present

## 2024-03-14 DIAGNOSIS — H04209 Unspecified epiphora, unspecified lacrimal gland: Secondary | ICD-10-CM | POA: Diagnosis not present

## 2024-03-14 DIAGNOSIS — C50412 Malignant neoplasm of upper-outer quadrant of left female breast: Secondary | ICD-10-CM | POA: Diagnosis present

## 2024-03-14 DIAGNOSIS — G629 Polyneuropathy, unspecified: Secondary | ICD-10-CM | POA: Insufficient documentation

## 2024-03-14 DIAGNOSIS — Z5111 Encounter for antineoplastic chemotherapy: Secondary | ICD-10-CM | POA: Diagnosis present

## 2024-03-14 LAB — CMP (CANCER CENTER ONLY)
ALT: 22 U/L (ref 0–44)
AST: 45 U/L — ABNORMAL HIGH (ref 15–41)
Albumin: 4.2 g/dL (ref 3.5–5.0)
Alkaline Phosphatase: 63 U/L (ref 38–126)
Anion gap: 9 (ref 5–15)
BUN: 18 mg/dL (ref 8–23)
CO2: 26 mmol/L (ref 22–32)
Calcium: 10.2 mg/dL (ref 8.9–10.3)
Chloride: 105 mmol/L (ref 98–111)
Creatinine: 0.74 mg/dL (ref 0.44–1.00)
GFR, Estimated: >60 mL/min (ref >60)
Glucose, Bld: 90 mg/dL (ref 70–99)
Potassium: 4.7 mmol/L (ref 3.5–5.1)
Sodium: 140 mmol/L (ref 135–145)
Total Bilirubin: 0.5 mg/dL (ref 0.0–1.2)
Total Protein: 6.9 g/dL (ref 6.5–8.1)

## 2024-03-14 LAB — CBC WITH DIFFERENTIAL (CANCER CENTER ONLY)
Abs Immature Granulocytes: 0 K/uL (ref 0.00–0.07)
Basophils Absolute: 0 K/uL (ref 0.0–0.1)
Basophils Relative: 1 %
Eosinophils Absolute: 0.1 K/uL (ref 0.0–0.5)
Eosinophils Relative: 2 %
HCT: 36.4 % (ref 36.0–46.0)
Hemoglobin: 12.6 g/dL (ref 12.0–15.0)
Immature Granulocytes: 0 %
Lymphocytes Relative: 33 %
Lymphs Abs: 1.8 K/uL (ref 0.7–4.0)
MCH: 36.1 pg — ABNORMAL HIGH (ref 26.0–34.0)
MCHC: 34.6 g/dL (ref 30.0–36.0)
MCV: 104.3 fL — ABNORMAL HIGH (ref 80.0–100.0)
Monocytes Absolute: 0.6 K/uL (ref 0.1–1.0)
Monocytes Relative: 12 %
Neutro Abs: 2.8 K/uL (ref 1.7–7.7)
Neutrophils Relative %: 52 %
Platelet Count: 163 K/uL (ref 150–400)
RBC: 3.49 MIL/uL — ABNORMAL LOW (ref 3.87–5.11)
RDW: 14.6 % (ref 11.5–15.5)
WBC Count: 5.3 K/uL (ref 4.0–10.5)
nRBC: 0 % (ref 0.0–0.2)

## 2024-03-14 MED ORDER — DEXAMETHASONE SOD PHOSPHATE PF 10 MG/ML IJ SOLN
10.0000 mg | Freq: Once | INTRAMUSCULAR | Status: AC
  Administered 2024-03-14: 10 mg via INTRAVENOUS

## 2024-03-14 MED ORDER — SODIUM CHLORIDE 0.9% FLUSH: 10.0000 mL | INTRAVENOUS | Status: DC | PRN

## 2024-03-14 MED ORDER — DIPHENHYDRAMINE HCL 25 MG PO CAPS
25.0000 mg | ORAL_CAPSULE | Freq: Once | ORAL | Status: AC
  Administered 2024-03-14: 25 mg via ORAL
  Filled 2024-03-14: qty 1

## 2024-03-14 MED ORDER — DEXTROSE 5 % IV SOLN: Freq: Once | INTRAVENOUS | Status: AC

## 2024-03-14 MED ORDER — ZOLEDRONIC ACID 4 MG/5ML IV CONC
3.0000 mg | Freq: Once | INTRAVENOUS | Status: AC
  Administered 2024-03-14: 3 mg via INTRAVENOUS
  Filled 2024-03-14: qty 3.75

## 2024-03-14 MED ORDER — ACETAMINOPHEN 325 MG PO TABS
650.0000 mg | ORAL_TABLET | Freq: Once | ORAL | Status: AC
  Administered 2024-03-14: 650 mg via ORAL
  Filled 2024-03-14: qty 2

## 2024-03-14 MED ORDER — PALONOSETRON HCL INJECTION 0.25 MG/5ML
0.2500 mg | Freq: Once | INTRAVENOUS | Status: AC
  Administered 2024-03-14: 0.25 mg via INTRAVENOUS
  Filled 2024-03-14: qty 5

## 2024-03-14 MED ORDER — FAM-TRASTUZUMAB DERUXTECAN-NXKI CHEMO 100 MG IV SOLR
175.0000 mg | Freq: Once | INTRAVENOUS | Status: AC
  Administered 2024-03-14: 180 mg via INTRAVENOUS
  Filled 2024-03-14: qty 9

## 2024-03-14 NOTE — Patient Instructions (Signed)
 Fam-Trastuzumab  Deruxtecan Injection What is this medication? FAM-TRASTUZUMAB  DERUXTECAN (fam-tras TOOZ eu mab DER ux TEE kan) treats some types of cancer. It works by blocking a protein that causes cancer cells to grow and multiply. This helps to slow or stop the spread of cancer cells. This medicine may be used for other purposes; ask your health care provider or pharmacist if you have questions. COMMON BRAND NAME(S): ENHERTU  What should I tell my care team before I take this medication? They need to know if you have any of these conditions: Heart disease Heart failure Infection, especially a viral infection, such as chickenpox, cold sores, or herpes Liver disease Lung or breathing disease, such as asthma or COPD An unusual or allergic reaction to fam-trastuzumab  deruxtecan, other medications, foods, dyes, or preservatives Pregnant or trying to get pregnant Breast-feeding How should I use this medication? This medication is injected into a vein. It is given by your care team in a hospital or clinic setting. A special MedGuide will be given to you before each treatment. Be sure to read this information carefully each time. Talk to your care team about the use of this medication in children. Special care may be needed. Overdosage: If you think you have taken too much of this medicine contact a poison control center or emergency room at once. NOTE: This medicine is only for you. Do not share this medicine with others. What if I miss a dose? It is important not to miss your dose. Call your care team if you are unable to keep an appointment. What may interact with this medication? Interactions are not expected. This list may not describe all possible interactions. Give your health care provider a list of all the medicines, herbs, non-prescription drugs, or dietary supplements you use. Also tell them if you smoke, drink alcohol , or use illegal drugs. Some items may interact with your  medicine. What should I watch for while using this medication? Visit your care team for regular checks on your progress. Tell your care team if your symptoms do not start to get better or if they get worse. This medication may increase your risk of getting an infection. Call your care team for advice if you get a fever, chills, sore throat, or other symptoms of a cold or flu. Do not treat yourself. Try to avoid being around people who are sick. Avoid taking medications that contain aspirin, acetaminophen , ibuprofen, naproxen, or ketoprofen unless instructed by your care team. These medications may hide a fever. Be careful brushing or flossing your teeth or using a toothpick because you may get an infection or bleed more easily. If you have any dental work done, tell your dentist you are receiving this medication. This medication may cause dry eyes and blurred vision. If you wear contact lenses, you may feel some discomfort. Lubricating eye drops may help. See your care team if the problem does not go away or is severe. Talk to your care team if you may be pregnant. Serious birth defects can occur if you take this medication during pregnancy and for 7 months after the last dose. If your partner can get pregnant, use a condom during sex while taking this medication and for 4 months after the last dose. Do not breastfeed while taking this medication and for 7 months after the last dose. This medication may cause infertility. Talk to your care team if you are concerned about your fertility. What side effects may I notice from receiving this medication? Side effects  that you should report to your care team as soon as possible: Allergic reactions--skin rash, itching, hives, swelling of the face, lips, tongue, or throat Dry cough, shortness of breath or trouble breathing Infection--fever, chills, cough, sore throat, wounds that don't heal, pain or trouble when passing urine, general feeling of discomfort or  being unwell Heart failure--shortness of breath, swelling of the ankles, feet, or hands, sudden weight gain, unusual weakness or fatigue Unusual bruising or bleeding Side effects that usually do not require medical attention (report these to your care team if they continue or are bothersome): Constipation Diarrhea Hair loss Muscle pain Nausea Vomiting This list may not describe all possible side effects. Call your doctor for medical advice about side effects. You may report side effects to FDA at 1-800-FDA-1088. Where should I keep my medication? This medication is given in a hospital or clinic. It will not be stored at home. NOTE: This sheet is a summary. It may not cover all possible information. If you have questions about this medicine, talk to your doctor, pharmacist, or health care provider.  2024 Elsevier/Gold Standard (2022-12-24 00:00:00)

## 2024-03-14 NOTE — Progress Notes (Signed)
 [facility]  [address] [place],  [place]  [address] [phone]  Clinic Day: 03/14/24  Referring physician: [name], MD  ASSESSMENT & PLAN:  Assessment: Malignant neoplasm of upper-outer quadrant of left breast in female, estrogen receptor negative ([name]) History of stage IIB (T2c N1 M0) HER2 receptor positive, ER/PR negative, left breast cancer diagnosed in June 2020.  She was treated with neoadjuvant Kadcyla /Perjeta  followed by left mastectomy.  She had a complete response in breast and nodes. She had only received two doses of adjuvant trastuzumab  before discontinuing in December 2020. She developed recurrent disease in 2022.   Malignant neoplasm metastatic to liver [facility]) Numerous heterogeneously enhancing, internally necrotic lesions in the right hepatic lobe CT in December 2023, which was consistent with metastatic disease. Largest lesion measures up to 6.7 cm and there are at least 6 lesions.  Liver biopsy was consistent with metastatic breast cancer with negative estrogen and progesterone receptors and positive HER2 receptor. She was initially treated with trastuzumab  with an excellent response with significant decrease in the size of her liver lesions and sustained response. She later had progression of this disease in the chest wall and was switched to Enhertu .  CT chest, abdomen, and pelvis in March revealed stable subtle liver lesions with at least 1 lesion is no longer visualized. CT chest, abdomen and pelvis in October 2024 revealed a decrease in the hepatic lesions with no new lesions seen.  CT imaging in April was fairly stable except a slight increase in the left adrenal nodule. Current scanning now shows that nodule to be dramatically enlarged to 3.1cm in addition to a new retroperitoneal node measuring 1.8cm. I reviewed these results with her in detail and recommend a change in therapy but we will proceed with Enhertu  today while we await the results of the  Guardant testing. The previous liver metastases are no longer visible. This will give K[place]o a chance to think over how aggressive she wants to be. She has commented on the past that she would not take further treatment once this progressed but now she does want to try something if she can tolerate it.     Malignant neoplasm metastatic to bone [facility]) Bone metastases diagnosed in December 2022, for which she was placed on zoledronic  acid every 4 weeks. She is now receiving Zometa  every 6 weeks to correspond with her chemotherapy. CT imaging in April 2025 did not reveal any evidence of progressive disease. He bone lesions appear stable.   Chest wall recurrence of breast cancer, left ([name]) Left chest wall recurrence diagnosed in January 2023.  Biopsy revealed carcinoma consistent with her previous breast cancer with negative estrogen and progesterone receptors and positive HER2 receptor.  She was initially treated with docetaxel /trastuzumab  and received 10 cycles with a good response.  She then had recurrence in the left chest wall in September 2023, so was switched to Enhertu  with good response.  There is no recurrence in the left chest wall.    Polyneuropathy She has had chronic left hand pain, initially thought to be related to lymphedema and neuropathy.  Her lymphedema was controlled. She has decreased strength and muscle wasting.  She was referred to neurology for further evaluation.  EMG revealed polyneuropathy.  She has now seen orthopedics and had an MRI of the left brachial plexus that was unremarkable, but limited due to patient motion.  She had been on gabapentin  100 mg at bedtime, but discontinued that on her own. She saw Dr.  Li in neurology in October 2024 and underwent repeat EMG, which was more abnormal. She has been seen by Dr. [name] with [name] in [place], who did not feel surgery was a good option. The patient no longer sees occupational therapy, but does exercises at  home.  Epiphora She is very frustrated by this. She informed me that she was told about a procedure where they can place stents (pyrex tubes) in her eyes tear ducts to help with her excessive tearing. She has made an appointment with her ophthalmologist, Dr. Hughie Bail in [place].  I encouraged her to pursue this as we know certain agents such as docetaxel  can cause pannicular stenosis of the tear ducts.  This seems improved today.   Plan: She had a CT chest, abdomen, and pelvis done on 03/08/2024 that revealed an significant interval enlargement of a left adrenal mass consistent with worsened adrenal metastatic disease and an newly enlarged left retroperitoneal lymph node consistent with nodal metastatic disease. An unchanged widespread sclerotic osseous metastatic disease throughout the axial and proximal appendicular skeleton and unchanged pathologic wedge deformities of T6 and L2. I informed her that I ordered a Liquid Guardant test and explained what this is to her and the benefit. We discussed other potential treatments such as lapatinib that works against HER2 positive tumors. However, this comes with the common side effect of diarrhea and is usually paired with chemotherapy such as captecitabine, or Tucatinib, Kadcyla , and there are several other options. Her day 1 cycle 38 of Enhertu  is scheduled on 03/14/2024 and we will continue with this cycle before making a change to her treatment. We will discuss that at her next visit when we have the Guardant results back and we'll make our decisions then. With her advanced age of 89, she is not likely to tolerate an aggressive therapy, nor would want to. She has a WBC of 5.3, low hemoglobin of 12.6, and platelet count of 163,000. Her CMP is normal other than an elevated AST of 45. I will see her back in 3 weeks with CBC and CMP. The patient understands the plans discussed today and is in agreement with them.  She knows to contact our office if she develops  concerns prior to her next visit.   I provided 20 minutes of face-to-face time during this encounter and > 50% was spent counseling as documented under my assessment and plan.   [name], MD  [facility] A DEPT OF [name] [address] [place] [address] Dept: [phone] Dept Fax: [phone]   [name].  CHIEF COMPLAINT:  CC: Recurrent HER2 receptor positive breast cancer  Current Treatment: Enhertu  every 3 weeks  HISTORY OF PRESENT ILLNESS:  Jasmin Lewis is a 89 year old woman with recurrent HER2 receptor positive. She was diagnosed with stage IIB (T2 N1 M0) HER2 receptor positive and hormone receptor negative left breast cancer in June 2020 when she was able to palpate a mass of the left breast. Mammogram and ultrasound in May 2020 revealed a 2.9 cm irregular mass in the upper outer left breast as well as two adjacent abnormal left axillary lymph nodes. Biopsy was obtained on June 3rd and revealed invasive ductal carcinoma and DCIS with lymphovascular invasion. Left axillary lymph node was positive for invasive ductal carcinoma. HER2 was positive 3+. Estrogen and progesterone receptors were both negative at 0%, and Ki67 was 70%.  Breast MRI from July revealed a 3.3 cm mass of the lateral portion of the left breast as well as significant non mass enhancement surrounding this mass and extending anteriorly into the nipple base, with largest diameter in the anterior to posterior axis, measuring 7.2 centimeters. Three enlarged left axillary lymph nodes. Right breast was negative.  PET imaging in July revealed no evidence of additional metastatic involvement. She received neoadjuvant trastuzumab /pertuzumab  for 6 cycles. Repeat breast MRI in October revealed complete resolution of the enhancing mass and associated non mass enhancement within the left breast.  Previously identified left axillary lymphadenopathy was no longer identified. She underwent left mastectomy and targeted lymph node dissection in November 2020.  Pathology revealed evidence of malignancy within the breast and three sentinel lymph nodes were negative for malignancy (0/3). She continued maintenance trastuzumab  injections for two more doses, but this was discontinued in December 2020 as she refused further ECHO scans.    Of note, she also has a history of stage II colon cancer, diagnosed in May 2007. Preop CEA was 5.2. She underwent surgical resection and surgical pathology revealed a 5.9 x 3.8 x 1.2 cm moderate to well-differentiated adenocarcinoma penetrating the muscular wall. Forty lymph nodes negative. No vascular or lymphatic invasion. Multiple diverticula with microabscess formation and inflammation. Carcinoma present in at least 1 diverticulum. She received adjuvant chemotherapy with capecitabine for 9 months. Colonoscopy from August 2013 revealed 2 polyps which were resected and chronic diverticulosis.  In December 2022, she was evaluated at the [facility] ED for abdominal and back pain.  CT abdomen pelvis revealed new lytic lesion involving the L2 vertebral body concerning for metastatic disease, as well as multiple hepatic rim-enhancing fluid collections with significant surrounding edema and an additional subtle 1.5 cm hypodensity in the hepatic dome. Given clinical history and presence of a new L2 lesion, leading differential consideration is metastatic disease. MRI abdomen in revealed numerous heterogeneously enhancing, internally necrotic lesions in the right hepatic lobe, highly concerning for metastatic disease. Largest lesion measures up to 6.7 cm in hepatic segment VI. Enhancing osseous lesions in the L2 vertebral body, both iliac bones, and in the sacrum, highly concerning for osseous metastases was also seen.    PET scan revealed widespread hypermetabolic disease in the left  chest wall, left axillary, subpectoral and supraclavicular nodes, liver, left adrenal and bone.  MRI thoracic/lumbar spine revealed widespread osseous metastasis with the largest deposit replacing L2 vertebral body where there was a compression fracture with mild height loss and early epidural tumor extension affecting L2-3 foramina.  MRI brain was negative for metastatic disease.  Biopsy of of the left chest wall  was consistent with recurrent ductal carcinoma of the breast.  HER2 was positive, estrogen and progesterone receptors were negative.  She was initially treated with trastuzumab , as well as zoledronic  acid, beginning in January 2023 with an initial response.   Oncology History Overview Note  Cancer Staging Malignant neoplasm of upper-outer quadrant of left breast in female, estrogen receptor negative ([name]) Staging form: Breast, AJCC 8th Edition - Clinical stage from 10/11/2018: Stage IIB (cT2, cN1, cM0, G3, ER-, PR-, HER2+) - Signed by Lanny Callander, MD on 11/02/2018 - Clinical: No stage assigned - Unsigned    History of colon cancer, stage II  09/2005 Initial Diagnosis   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 Findings were a 5.9 x 3.8 x 1.2 cm moderate to well-differentiated  adenocarcinoma penetrating the muscular wall. Forty lymph nodes negative. No vascular or lymphatic invasion. Multiple diverticula with microabscess formation and inflammation. Carcinoma present in at least 1 diverticulum. Preop CEA 5.2 with lab normal range 0 to 2.5. Preop CT scan with no obvious additional pathology.    2007 -  Chemotherapy   She received oral Xeloda chemotherapy as an adjuvant. She declined treatment on a clinical trial.    11/12/2011 Initial Diagnosis   [name], stage II   12/09/2011 Procedure   Followup colonoscopy done on 12/09/2011. She was found to have 2 polyps which were removed. Chronic  diverticulosis.     Malignant neoplasm of upper-outer quadrant of left breast in female, estrogen receptor negative ([name])  09/26/2018 Mammogram   Mammogram/US  of left breast 09/26/18 IMPRESSION:  1. 2.9cm irregular mass in the upper outer left breast corresponds to the palpable abnormality. This is highly suspicious for breast carcinoma.  2. Two adjacent abnormal left axillary  Lymph nodes suspicious for metastatic adenopathy. There is a third borderline abnormal left axillary LN with a cortex thickened to 4mm.  3. Benign right breast cyst. No evidence of right breast malignancy.    10/11/2018 Cancer Staging   Staging form: Breast, AJCC 8th Edition - Clinical stage from 10/11/2018: Stage IIB (cT2, cN1, cM0, G3, ER-, PR-, HER2+) - Signed by Lanny Callander, MD on 11/02/2018   10/11/2018 Initial Biopsy   Diagnosis 10/11/18 1. Breast, left, needle core biopsy, upper outer left 2 o'clock - INVASIVE DUCTAL CARCINOMA. - DUCTAL CARCINOMA IN SITU. - LYMPHOVASCULAR INVASION IS IDENTIFIED. - SEE COMMENT. 2. Lymph node, needle/core biopsy, left axilla - INVASIVE DUCTAL CARCINOMA. - SEE COMMENT.   10/11/2018 Receptors her2   The tumor cells are POSITIVE for Her2 (3+). Estrogen Receptor: 0%, NEGATIVE Progesterone Receptor: 0%, NEGATIVE Proliferation Marker Ki67: 70%   11/02/2018 Initial Diagnosis   Malignant neoplasm of upper-outer quadrant of left breast in female, estrogen receptor negative ([name])   11/15/2018 Breast MRI   MRI breast 11/15/18  IMPRESSION: 1. 3.3 centimeter mass in the LATERAL portion of the LEFT breast consistent with known malignancy. 2. There is significant non mass enhancement surrounding this mass and extending anteriorly into the nipple base, with largest diameter in the anterior to posterior axis, measuring 7.2 centimeters. 3. If the patient would consider breast conservation, additional MR guided core biopsies are recommended. Consider biopsy of the inferior and anterior extent of  the non mass enhancement to document extent of disease. 4. Three enlarged LEFT axillary lymph nodes. 5. RIGHT breast is negative.   11/15/2018 PET scan   PET 11/15/18 IMPRESSION: Hypermetabolic left breast lesion compatible with known primary. Hypermetabolic left axillary lymph nodes are consistent with metastatic disease.   No evidence for additional hypermetabolic metastatic involvement in the neck, chest, abdomen, or pelvis.   11/17/2018 - 03/01/2019 Chemotherapy   Neo-adjuvant Kadcyla  and perjeta  q3weeks for 6 cycles starting 11/17/18. Stopped before surgery    11/29/2018 Pathology Results   Diagnosis 1. Breast, left, needle core biopsy, inferior anterior (cylinder clip) - INVASIVE DUCTAL CARCINOMA. - DUCTAL CARCINOMA IN SITU. - LYMPHOVASCULAR INVASION IS IDENTIFIED. - SEE COMMENT. 2. Breast, left, needle core biopsy, central posterior (barbell clip) - INVASIVE DUCTAL CARCINOMA. - LYMPHOVASCULAR INVASION IS IDENTIFIED.   02/12/2019 Breast MRI   IMPRESSION: 1. Complete resolution of previously identified enhancing mass and associated non mass enhancement within the left breast. This is consistent with excellent response to chemotherapy. No residual or suspicious findings are identified. 2. No  MRI evidence of malignancy on the right. 3. Previously identified left axillary lymphadenopathy not definitively seen on today's study. However, this may be due to decreased field-of-view compared to prior study.     03/14/2019 Cancer Staging   Staging form: Breast, AJCC 8th Edition - Pathologic stage from 03/14/2019: pT0, pN0, cM0, GX, ER: Unknown, PR: Unknown, HER2: Not Assessed - Signed by Lanny Callander, MD on 03/28/2019   03/14/2019 Surgery   LEFT MASTECTOMY WITH TARGETED LYMPH NODE DISSECTION and Left Axillary Sentinel Lymph  Node Biopsy by Dr Curvin 03/14/19    03/14/2019 Pathology Results   FINAL MICROSCOPIC DIAGNOSIS:   A. LYMPH NODE, LEFT, SENTINEL, BIOPSY:  - There is no evidence  of carcinoma in 1 of 1 lymph node (0/1).   B. BREAST, LEFT, MASTECTOMY:  - Benign breast parenchyma with treatment-related changes.  - There is no evidence of malignancy.  - See oncology table below.   C. LYMPH NODE, LEFT #1, SENTINEL, BIOPSY:  - There is no evidence of carcinoma in 1 of 1 lymph node (0/1).   D. LYMPH NODE, LEFT #2, SENTINEL, BIOPSY:  - There is no evidence of carcinoma in 1 of 1 lymph node (0/1).     04/04/2019 - 04/25/2019 Chemotherapy   Maintenance Herceptin  injections every 3 weeks starting 04/03/19 to complete 1 year of treatment that was started in 11/2018. Stopped after 2nd dose as she will not be repeating Echos.    06/03/2021 - 06/24/2021 Chemotherapy   Patient is on Treatment Plan : BREAST Trastuzumab  q21d X 11 Cycles     06/25/2021 - 01/01/2022 Chemotherapy   Patient is on Treatment Plan : BREAST Docetaxel  + Trastuzumab  + Pertuzumab  (THP) q21d     06/25/2021 - 01/02/2022 Chemotherapy   Patient is on Treatment Plan : BREAST Docetaxel  + Trastuzumab  + Pertuzumab  (THP) q21d     09/14/2021 Imaging   CT chest/abdomen/pelvis  IMPRESSION:  1. Interval resolution of previously seen bulky left axillary and  subpectoral lymphadenopathy. No persistently enlarged lymph nodes.  2. Multiple liver lesions are significantly diminished in size.  3. Widespread osseous metastatic disease, with an interval increase  in sclerosis of several previously lytic lesions.  4. Constellation of findings is consistent with treatment response  of metastatic disease  5. New, although age indeterminate pathologic wedge deformity of the  T6 vertebral body as well as an increased pathologic wedge deformity  of the L2 vertebral body.  6. Coronary artery disease.  Aortic Atherosclerosis (ICD10-I70.0).    01/26/2022 - 03/14/2024 Chemotherapy   Patient is on Treatment Plan : BREAST METASTATIC Fam-Trastuzumab Deruxtecan-nxki  (Enhertu ) (5.4) q21d     Malignant neoplasm metastatic to liver ([name])   04/17/2021 Imaging   CT ABDOMEN AND PELVIS WITH CONTRAST: -New lytic lesion involving the L2 vertebral body (6:72, 2:26) with minimal cortical breakthrough superiorly, but with preservation of vertebral body height, concerning for metastatic disease.  -There are several indeterminate hepatic lesions as described above. In addition to the lesions described above, there is an additional subtle 1.5 cm hypodensity in the hepatic dome (2:7). Given clinical history and presence of a new L2 lesion, leading differential consideration is metastatic disease. Recommend further evaluation with MRI of the abdomen with and without contrast.   05/12/2021 Imaging   MRI ABDOMEN WITH AND WITHOUT CONTRAST: Numerous heterogeneously enhancing, internally necrotic lesions in the right hepatic lobe, highly concerning for metastatic disease. Largest lesion measures up to 6.7 cm in hepatic segment VI.   Enhancing osseous lesions in the  L2 vertebral body, both iliac bones, and in the sacrum, highly concerning for osseous metastases.   05/15/2021 Initial Diagnosis   Liver metastases ([name])   05/27/2021 PET scan   1. Widespread recurrent/metastatic disease in this patient who is  status post bilateral mastectomy. Left chest wall recurrence with  left axillary/subpectoral, supraclavicular nodal metastasis.  2. Hepatic, left adrenal, and widespread osseous metastasis.  3. Incidental findings, including: Right nephrolithiasis. Tiny  hiatal hernia.    05/27/2021 Imaging   MRI LUMBAR AND THORACIC SPINE: Thoracic spine:  Osseous metastatic disease involving each level. The most dramatic deposits are at T4 and T6 where there is extraosseous/epidural tumor. No cord compression but there is foraminal impingement on the left at T6-7 and on the right at T3-4. Early dorsal epidural tumor at the level of T10 and T3.   Lumbar spine:  1. Widespread osseous metastatic disease with largest deposit  replacing the L2 body where there is a  compression fracture with  mild height loss. Also at this level is early epidural tumor  extension likely affecting both L2-3 foramina.  2. 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    05/27/2021 Imaging   MRI HEAD WITH AND WITHOUT CONTRAST: Negative for metastatic disease to the brain or calvarium.   06/03/2021 - 06/24/2021 Chemotherapy   Patient is on Treatment Plan : BREAST Trastuzumab  q21d X 11 Cycles     06/25/2021 - 01/01/2022 Chemotherapy   Patient is on Treatment Plan : BREAST Docetaxel  + Trastuzumab  + Pertuzumab  (THP) q21d     06/25/2021 - 01/02/2022 Chemotherapy   Patient is on Treatment Plan : BREAST Docetaxel  + Trastuzumab  + Pertuzumab  (THP) q21d     09/14/2021 Imaging   CT chest/abdomen/pelvis  IMPRESSION:  1. Interval resolution of previously seen bulky left axillary and  subpectoral lymphadenopathy. No persistently enlarged lymph nodes.  2. Multiple liver lesions are significantly diminished in size.  3. Widespread osseous metastatic disease, with an interval increase  in sclerosis of several previously lytic lesions.  4. Constellation of findings is consistent with treatment response  of metastatic disease  5. New, although age indeterminate pathologic wedge deformity of the  T6 vertebral body as well as an increased pathologic wedge deformity  of the L2 vertebral body.  6. Coronary artery disease.  Aortic Atherosclerosis (ICD10-I70.0).    01/26/2022 - 03/14/2024 Chemotherapy   Patient is on Treatment Plan : BREAST METASTATIC Fam-Trastuzumab Deruxtecan-nxki  (Enhertu ) (5.4) q21d     Malignant neoplasm metastatic to bone ([name])  04/17/2021 Imaging   CT ABDOMEN AND PELVIS WITH CONTRAST: -New lytic lesion involving the L2 vertebral body (6:72, 2:26) with minimal cortical breakthrough superiorly, but with preservation of vertebral body height, concerning for metastatic disease.  -There are several indeterminate hepatic lesions as described above.  In addition to the lesions described above, there is an additional subtle 1.5 cm hypodensity in the hepatic dome (2:7). Given clinical history and presence of a new L2 lesion, leading differential consideration is metastatic disease. Recommend further evaluation with MRI of the abdomen with and without contrast.   05/12/2021 Imaging   MRI ABDOMEN WITH AND WITHOUT CONTRAST: Numerous heterogeneously enhancing, internally necrotic lesions in the right hepatic lobe, highly concerning for metastatic disease. Largest lesion measures up to 6.7 cm in hepatic segment VI.   Enhancing osseous lesions in the L2 vertebral body, both iliac bones, and in the sacrum, highly concerning for osseous metastases.   05/15/2021 Initial Diagnosis   Bone metastases ([name])  05/27/2021 PET scan   1. Widespread recurrent/metastatic disease in this patient who is  status post bilateral mastectomy. Left chest wall recurrence with  left axillary/subpectoral, supraclavicular nodal metastasis.  2. Hepatic, left adrenal, and widespread osseous metastasis.  3. Incidental findings, including: Right nephrolithiasis. Tiny  hiatal hernia.    05/27/2021 Imaging   MRI LUMBAR AND THORACIC SPINE: Thoracic spine:  Osseous metastatic disease involving each level. The most dramatic deposits are at T4 and T6 where there is extraosseous/epidural tumor. No cord compression but there is foraminal impingement on the left at T6-7 and on the right at T3-4. Early dorsal epidural tumor at the level of T10 and T3.   Lumbar spine:  1. Widespread osseous metastatic disease with largest deposit  replacing the L2 body where there is a compression fracture with  mild height loss. Also at this level is early epidural tumor  extension likely affecting both L2-3 foramina.  2. 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    05/27/2021 Imaging   MRI HEAD WITH AND WITHOUT CONTRAST: Negative for metastatic disease to the brain or  calvarium.   06/03/2021 - 06/24/2021 Chemotherapy   Patient is on Treatment Plan : BREAST Trastuzumab  q21d X 11 Cycles     06/25/2021 - 01/01/2022 Chemotherapy   Patient is on Treatment Plan : BREAST Docetaxel  + Trastuzumab  + Pertuzumab  (THP) q21d     06/25/2021 - 01/02/2022 Chemotherapy   Patient is on Treatment Plan : BREAST Docetaxel  + Trastuzumab  + Pertuzumab  (THP) q21d     09/14/2021 Imaging   CT chest/abdomen/pelvis  IMPRESSION:  1. Interval resolution of previously seen bulky left axillary and  subpectoral lymphadenopathy. No persistently enlarged lymph nodes.  2. Multiple liver lesions are significantly diminished in size.  3. Widespread osseous metastatic disease, with an interval increase  in sclerosis of several previously lytic lesions.  4. Constellation of findings is consistent with treatment response  of metastatic disease  5. New, although age indeterminate pathologic wedge deformity of the  T6 vertebral body as well as an increased pathologic wedge deformity  of the L2 vertebral body.  6. Coronary artery disease.  Aortic Atherosclerosis (ICD10-I70.0).    01/26/2022 - 03/14/2024 Chemotherapy   Patient is on Treatment Plan : BREAST METASTATIC Fam-Trastuzumab Deruxtecan-nxki  (Enhertu ) (5.4) q21d         INTERVAL HISTORY:  Jasmin Lewis is here today for repeat clinical assessment for her recurrent HER2 receptor positive breast cancer, previously treated with Taxane and Herceptin . When she progressed she was switched to Enhertu  and has done extremely well for 2 years. Patient states that she feels well and has no complaints pain. She had a CT chest, abdomen, and pelvis done on 03/08/2024 that revealed an significant interval enlargement of a left adrenal mass consistent with worsened adrenal metastatic disease and a newly enlarged left retroperitoneal lymph node consistent with nodal metastatic disease. She has unchanged widespread sclerotic osseous metastatic disease throughout the  axial and proximal appendicular skeleton and unchanged pathologic wedge deformities of T6 and L2.  I informed her that I ordered a Liquid Guardant test and explained what this is to her and the benefit. We discussed other potential treatments such as lapatinib that works against HER2 positive tumors. However, this comes with the common side effect of diarrhea and is usually paired with chemotherapy such as capecitabine, or Tucatinib, Kadcyla , and there are several other options Her day 1 cycle 38 of Enhertu  is scheduled on 03/14/2024 and we will continue with this  cycle before making a change to her treatment. We will discuss that at her next visit when we have the Guardant results back and we'll make our decisions then. With her advanced age of 73, she is not likely to tolerate an aggressive therapy, nor would want to. She has a WBC of 5.3, low hemoglobin of 12.6, and platelet count of 163,000. Her CMP is normal other than an elevated AST of 45. I will see her back in 3 weeks with CBC and CMP.   She denies fever, chills, night sweats, or other signs of infection. She denies cardiorespiratory and gastrointestinal issues. She  denies pain. Her appetite is good and Her weight has increased 1 pounds over last 3 weeks.    REVIEW OF SYSTEMS:   Review of Systems  Constitutional:  Negative for appetite change, chills, fatigue, fever and unexpected weight change.  HENT:  Negative.  Negative for lump/mass, mouth sores and sore throat.   Eyes:  Positive for eye problems (excessive tearing).  Respiratory: Negative.  Negative for chest tightness, cough, hemoptysis, shortness of breath and wheezing.   Cardiovascular: Negative.  Negative for chest pain, leg swelling and palpitations.  Gastrointestinal: Negative.  Negative for abdominal distention, abdominal pain, blood in stool, constipation, diarrhea, nausea and vomiting.  Endocrine: Negative.  Negative for hot flashes.  Genitourinary: Negative.  Negative for  difficulty urinating, dysuria, frequency and hematuria.   Musculoskeletal:  Positive for arthralgias and gait problem (uses a cane, occasionally). Negative for back pain, flank pain and myalgias.  Skin: Negative.  Negative for rash.  Neurological:  Positive for extremity weakness (atrophy and severe left hand weakness) and gait problem (uses a cane, occasionally). Negative for dizziness, headaches, light-headedness, numbness, seizures and speech difficulty.  Hematological: Negative.  Negative for adenopathy. Does not bruise/bleed easily.  Psychiatric/Behavioral: Negative.  Negative for depression and sleep disturbance. The patient is not nervous/anxious.     VITALS:  Blood pressure (!) 153/75, pulse 77, temperature 98 F (36.7 C), temperature source Oral, resp. rate 18, height 5' 0.25 (1.53 m), weight 106 lb 12.8 oz (48.4 kg), SpO2 97%.  Wt Readings from Last 3 Encounters:  03/14/24 106 lb 12.8 oz (48.4 kg)  02/22/24 105 lb 9.6 oz (47.9 kg)  02/01/24 107 lb 1.6 oz (48.6 kg)    Body mass index is 20.69 kg/m.  Performance status (ECOG): 1 - Symptomatic but completely ambulatory  PHYSICAL EXAM:   Physical Exam Vitals and nursing note reviewed.  Constitutional:      General: She is not in acute distress.    Appearance: Normal appearance. She is normal weight. She is not ill-appearing, toxic-appearing or diaphoretic.  HENT:     Head: Normocephalic and atraumatic.     Right Ear: Tympanic membrane, ear canal and external ear normal. There is no impacted cerumen.     Left Ear: Tympanic membrane, ear canal and external ear normal. There is no impacted cerumen.     Nose: Nose normal. No congestion or rhinorrhea.     Mouth/Throat:     Mouth: Mucous membranes are moist.     Pharynx: Oropharynx is clear. No oropharyngeal exudate or posterior oropharyngeal erythema.  Eyes:     General: No scleral icterus.       Right eye: No discharge.        Left eye: No discharge.     Extraocular  Movements: Extraocular movements intact.     Conjunctiva/sclera: Conjunctivae normal.     Pupils: Pupils are equal, round, and  reactive to light.     Comments: Excessive tearing  Neck:     Vascular: No carotid bruit.  Cardiovascular:     Rate and Rhythm: Normal rate and regular rhythm.     Pulses: Normal pulses.     Heart sounds: Normal heart sounds. No murmur heard.    No friction rub. No gallop.  Pulmonary:     Effort: Pulmonary effort is normal. No respiratory distress.     Breath sounds: Normal breath sounds. No stridor. No wheezing, rhonchi or rales.  Chest:     Chest wall: No tenderness.  Breasts:    Right: Normal.     Left: Normal.     Comments: Left chest wall is negative. Right breast is without masses Abdominal:     General: Bowel sounds are normal. There is no distension.     Palpations: Abdomen is soft. There is no hepatomegaly, splenomegaly or mass.     Tenderness: There is no abdominal tenderness. There is no right CVA tenderness, left CVA tenderness, guarding or rebound.     Hernia: No hernia is present.  Musculoskeletal:        General: No swelling, tenderness, deformity or signs of injury. Normal range of motion.     Right hand: Normal.     Left hand: Decreased strength.     Cervical back: Normal range of motion and neck supple. No rigidity or tenderness.     Right lower leg: No edema.     Left lower leg: No edema.     Comments: Wasting of the left hand  Lymphadenopathy:     Cervical: No cervical adenopathy.     Right cervical: No superficial, deep or posterior cervical adenopathy.    Left cervical: No superficial, deep or posterior cervical adenopathy.     Upper Body:     Right upper body: No supraclavicular, axillary or pectoral adenopathy.     Left upper body: No supraclavicular, axillary or pectoral adenopathy.  Skin:    General: Skin is warm and dry.     Coloration: Skin is not jaundiced or pale.     Findings: No bruising, erythema, lesion or rash.   Neurological:     General: No focal deficit present.     Mental Status: She is alert and oriented to person, place, and time. Mental status is at baseline.     Cranial Nerves: No cranial nerve deficit.     Sensory: No sensory deficit.     Motor: No weakness.     Coordination: Coordination normal.     Gait: Gait normal.     Deep Tendon Reflexes: Reflexes normal.  Psychiatric:        Mood and Affect: Mood normal.        Behavior: Behavior normal.        Thought Content: Thought content normal.        Judgment: Judgment normal.    LABS:      Latest Ref Rng & Units 03/14/2024   12:59 PM 02/22/2024    1:14 PM 02/01/2024    1:38 PM  CBC  WBC 4.0 - 10.5 K/uL 5.3  5.3  5.6   Hemoglobin 12.0 - 15.0 g/dL 87.3  87.4  87.3   Hematocrit 36.0 - 46.0 % 36.4  36.4  36.4   Platelets 150 - 400 K/uL 163  213  151       Latest Ref Rng & Units 03/14/2024   12:59 PM 02/22/2024    1:14 PM  02/01/2024    1:38 PM  CMP  Glucose 70 - 99 mg/dL 90  898  897   BUN 8 - 23 mg/dL 18  25  21    Creatinine 0.44 - 1.00 mg/dL 9.25  9.15  9.21   Sodium 135 - 145 mmol/L 140  143  142   Potassium 3.5 - 5.1 mmol/L 4.7  4.5  4.4   Chloride 98 - 111 mmol/L 105  107  107   CO2 22 - 32 mmol/L 26  25  24    Calcium 8.9 - 10.3 mg/dL 89.7  89.1  89.6   Total Protein 6.5 - 8.1 g/dL 6.9  7.1  6.9   Total Bilirubin 0.0 - 1.2 mg/dL 0.5  0.4  0.4   Alkaline Phos 38 - 126 U/L 63  69  66   AST 15 - 41 U/L 45  46  38   ALT 0 - 44 U/L 22  20  17     Lab Results  Component Value Date   CEA1 1.8 06/03/2021   CEA 0.5 11/13/2012   CEA  Date Value Ref Range Status  06/03/2021 1.8 0.0 - 4.7 ng/mL Final    Comment:    (NOTE)                             Nonsmokers          <3.9                             Smokers             <5.6 [employer] Electrochemiluminescence Immunoassay (ECLIA) Values obtained with different assay methods or kits cannot be used interchangeably.  Results cannot be interpreted as absolute  evidence of the presence or absence of malignant disease. Performed At: [facility] [address] [place], [place] [id number] Jennette Shorter MD Ey:[id number]   11/13/2012 0.5 0.0 - 5.0 ng/mL Final   Lab Results  Component Value Date   LDH 186 11/12/2011   LDH 187 09/08/2010   LDH 171 09/09/2009   STUDIES:  EXAM: 03/08/2024 CT CHEST, ABDOMEN, AND PELVIS WITH CONTRAST IMPRESSION: 1. Significant interval enlargement of a left adrenal mass, consistent with worsened adrenal metastatic disease. 2. Newly enlarged left retroperitoneal lymph node, consistent with nodal metastatic disease. 3. Unchanged widespread sclerotic osseous metastatic disease throughout the axial and proximal appendicular skeleton. Unchanged pathologic wedge deformities of T6 and L2. 4. Left mastectomy and axillary lymph node dissection. 5. Coronary artery disease.   HISTORY:   Past Medical History:  Diagnosis Date   Arthritis    shoulder   Breast cancer ([name])    Cancer ([name])    left breast ca   Colon cancer ([name])    [name], stage II 11/12/2011   Sigmoid lesion 5.9 cm  40 nodes negative but focus of cancer in a diverticum   Pre-op CEA 5.2 with lab normal up to 2.5 resected 09/21/05   Xeloda adjuvant chemotherapy   Hypertension    Polyneuropathy 02/03/2023   Right shoulder pain 04/12/2023    Past Surgical History:  Procedure Laterality Date   COLONOSCOPY     HEMICOLECTOMY  2007   MASTECTOMY WITH AXILLARY LYMPH NODE DISSECTION Left 03/14/2019   Procedure: LEFT MASTECTOMY WITH TARGETED LYMPH NODE DISSECTION;  Surgeon: Curvin Deward MOULD, MD;  Location: [facility] OR;  Service: General;  Laterality: Left;  SENTINEL NODE BIOPSY Left 03/14/2019   Procedure: Left Axillary Sentinel Lymph  Node Biopsy;  Surgeon: Curvin Deward MOULD, MD;  Location: [facility] OR;  Service: General;  Laterality: Left;   TONSILLECTOMY     WISDOM TOOTH EXTRACTION      Family History  Problem Relation Age of Onset   Cancer  Maternal Aunt        colon cancer     Social History:  reports that she has never smoked. She has never used smokeless tobacco. She reports that she does not drink alcohol and does not use drugs.The patient is alone today.  Allergies: No Known Allergies  Current Medications: Current Outpatient Medications  Medication Sig Dispense Refill   Biotin 1 MG CAPS Take by mouth.     bisacodyl (DULCOLAX) 5 MG EC tablet Take 5 mg by mouth daily as needed for moderate constipation. 2 capsules every night     co-enzyme Q-10 30 MG capsule Take 100 mg by mouth daily.     fish oil-omega-3 fatty acids 1000 MG capsule Take 1 g by mouth daily.     MAGNESIUM PO Take 1 tablet by mouth every evening. 600 mg     Multiple Vitamin (MULTIVITAMIN) capsule Take 1 capsule by mouth daily.     neomycin-polymyxin b-dexamethasone  (MAXITROL) 3.5-10000-0.1 SUSP SMARTSIG:In Eye(s)     ondansetron  (ZOFRAN ) 4 MG tablet Take 1 tablet (4 mg total) by mouth every 8 (eight) hours as needed for nausea or vomiting. (Patient not taking: Reported on 01/11/2024) 20 tablet 2   polyethylene glycol (MIRALAX / GLYCOLAX) 17 g packet Take 17 g by mouth as needed. constipation     triamcinolone  cream (KENALOG ) 0.1 % Apply 1 Application topically 2 (two) times daily. (Patient taking differently: Apply 1 Application topically 2 (two) times daily as needed.) 30 g 1   No current facility-administered medications for this visit.    I,Jasmine M Lassiter,acting as a scribe for [name], MD.,have documented all relevant documentation on the behalf of [name], MD,as directed by  [name], MD while in the presence of [name], MD.

## 2024-03-15 ENCOUNTER — Other Ambulatory Visit: Payer: Self-pay

## 2024-03-22 ENCOUNTER — Other Ambulatory Visit: Payer: Self-pay

## 2024-03-26 ENCOUNTER — Encounter: Payer: Self-pay | Admitting: Oncology

## 2024-04-04 ENCOUNTER — Inpatient Hospital Stay

## 2024-04-04 ENCOUNTER — Inpatient Hospital Stay: Admitting: Hematology and Oncology

## 2024-04-04 ENCOUNTER — Other Ambulatory Visit: Payer: Self-pay | Admitting: Pharmacist

## 2024-04-04 ENCOUNTER — Inpatient Hospital Stay: Admitting: Oncology

## 2024-04-04 ENCOUNTER — Other Ambulatory Visit: Payer: Self-pay | Admitting: Oncology

## 2024-04-04 VITALS — BP 159/54 | HR 66 | Temp 97.8°F | Resp 18 | Ht 60.25 in | Wt 106.5 lb

## 2024-04-04 DIAGNOSIS — Z171 Estrogen receptor negative status [ER-]: Secondary | ICD-10-CM

## 2024-04-04 DIAGNOSIS — C7951 Secondary malignant neoplasm of bone: Secondary | ICD-10-CM

## 2024-04-04 DIAGNOSIS — C787 Secondary malignant neoplasm of liver and intrahepatic bile duct: Secondary | ICD-10-CM

## 2024-04-04 DIAGNOSIS — C50412 Malignant neoplasm of upper-outer quadrant of left female breast: Secondary | ICD-10-CM

## 2024-04-04 DIAGNOSIS — Z5111 Encounter for antineoplastic chemotherapy: Secondary | ICD-10-CM | POA: Diagnosis not present

## 2024-04-04 DIAGNOSIS — T451X5S Adverse effect of antineoplastic and immunosuppressive drugs, sequela: Secondary | ICD-10-CM

## 2024-04-04 LAB — CBC WITH DIFFERENTIAL (CANCER CENTER ONLY)
Abs Immature Granulocytes: 0.01 K/uL (ref 0.00–0.07)
Basophils Absolute: 0 K/uL (ref 0.0–0.1)
Basophils Relative: 1 %
Eosinophils Absolute: 0.1 K/uL (ref 0.0–0.5)
Eosinophils Relative: 3 %
HCT: 35.3 % — ABNORMAL LOW (ref 36.0–46.0)
Hemoglobin: 11.8 g/dL — ABNORMAL LOW (ref 12.0–15.0)
Immature Granulocytes: 0 %
Lymphocytes Relative: 33 %
Lymphs Abs: 1.4 K/uL (ref 0.7–4.0)
MCH: 35.6 pg — ABNORMAL HIGH (ref 26.0–34.0)
MCHC: 33.4 g/dL (ref 30.0–36.0)
MCV: 106.6 fL — ABNORMAL HIGH (ref 80.0–100.0)
Monocytes Absolute: 0.6 K/uL (ref 0.1–1.0)
Monocytes Relative: 16 %
Neutro Abs: 2 K/uL (ref 1.7–7.7)
Neutrophils Relative %: 47 %
Platelet Count: 162 K/uL (ref 150–400)
RBC: 3.31 MIL/uL — ABNORMAL LOW (ref 3.87–5.11)
RDW: 14.4 % (ref 11.5–15.5)
WBC Count: 4.1 K/uL (ref 4.0–10.5)
nRBC: 0 % (ref 0.0–0.2)

## 2024-04-04 LAB — CMP (CANCER CENTER ONLY)
ALT: 24 U/L (ref 0–44)
AST: 47 U/L — ABNORMAL HIGH (ref 15–41)
Albumin: 3.9 g/dL (ref 3.5–5.0)
Alkaline Phosphatase: 68 U/L (ref 38–126)
Anion gap: 9 (ref 5–15)
BUN: 20 mg/dL (ref 8–23)
CO2: 25 mmol/L (ref 22–32)
Calcium: 9.8 mg/dL (ref 8.9–10.3)
Chloride: 108 mmol/L (ref 98–111)
Creatinine: 0.94 mg/dL (ref 0.44–1.00)
GFR, Estimated: 57 mL/min — ABNORMAL LOW (ref >60)
Glucose, Bld: 97 mg/dL (ref 70–99)
Potassium: 4.5 mmol/L (ref 3.5–5.1)
Sodium: 142 mmol/L (ref 135–145)
Total Bilirubin: 0.5 mg/dL (ref 0.0–1.2)
Total Protein: 6.5 g/dL (ref 6.5–8.1)

## 2024-04-04 NOTE — Progress Notes (Signed)
 [facility] CARE CLINIC CONSULT NOTE [facility] [address] [place], [place] [address] [phone]   [phone]    PATIENT CARE TEAM: Patient Care Team: [name], MD as PCP - General (Family Medicine) [name], MD as Consulting Physician (Oncology)   Name of the patient: Jasmin Lewis  [id number]  Sep 16, 1931   Date of visit: 04/04/24  Diagnosis: Breast cancer  Chief complaint/Reason for visit- Initial Meeting for [name], preparing for starting chemotherapy  Heme/Onc history:  Oncology History Overview Note  Cancer Staging Malignant neoplasm of upper-outer quadrant of left breast in female, estrogen receptor negative ([name]) Staging form: Breast, AJCC 8th Edition - Clinical stage from 10/11/2018: Stage IIB (cT2, cN1, cM0, G3, ER-, PR-, HER2+) - Signed by Lanny Callander, MD on 11/02/2018 - Clinical: No stage assigned - Unsigned    History of colon cancer, stage II  09/2005 Initial Diagnosis   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 Findings were a 5.9 x 3.8 x 1.2 cm moderate to well-differentiated adenocarcinoma penetrating the muscular wall. Forty lymph nodes negative. No vascular or lymphatic invasion. Multiple diverticula with microabscess formation and inflammation. Carcinoma present in at least 1 diverticulum. Preop CEA 5.2 with lab normal range 0 to 2.5. Preop CT scan with no obvious additional pathology.    2007 -  Chemotherapy   She received oral Xeloda chemotherapy as an adjuvant. She declined treatment on a clinical trial.    11/12/2011 Initial Diagnosis   [name], stage II   12/09/2011 Procedure   Followup colonoscopy done on 12/09/2011. She was found to have 2 polyps which were removed. Chronic diverticulosis.     Malignant neoplasm of upper-outer quadrant of left breast in female, estrogen receptor  negative ([name])  09/26/2018 Mammogram   Mammogram/US  of left breast 09/26/18 IMPRESSION:  1. 2.9cm irregular mass in the upper outer left breast corresponds to the palpable abnormality. This is highly suspicious for breast carcinoma.  2. Two adjacent abnormal left axillary  Lymph nodes suspicious for metastatic adenopathy. There is a third borderline abnormal left axillary LN with a cortex thickened to 4mm.  3. Benign right breast cyst. No evidence of right breast malignancy.    10/11/2018 Cancer Staging   Staging form: Breast, AJCC 8th Edition - Clinical stage from 10/11/2018: Stage IIB (cT2, cN1, cM0, G3, ER-, PR-, HER2+) - Signed by Lanny Callander, MD on 11/02/2018   10/11/2018 Initial Biopsy   Diagnosis 10/11/18 1. Breast, left, needle core biopsy, upper outer left 2 o'clock - INVASIVE DUCTAL CARCINOMA. - DUCTAL CARCINOMA IN SITU. - LYMPHOVASCULAR INVASION IS IDENTIFIED. - SEE COMMENT. 2. Lymph node, needle/core biopsy, left axilla - INVASIVE DUCTAL CARCINOMA. - SEE COMMENT.   10/11/2018 Receptors her2   The tumor cells are POSITIVE for Her2 (3+). Estrogen Receptor: 0%, NEGATIVE Progesterone Receptor: 0%, NEGATIVE Proliferation Marker Ki67: 70%   11/02/2018 Initial Diagnosis   Malignant neoplasm of upper-outer quadrant of left breast in female, estrogen receptor negative ([name])   11/15/2018 Breast MRI   MRI breast 11/15/18  IMPRESSION: 1. 3.3 centimeter mass in the LATERAL portion of the LEFT breast consistent with known malignancy. 2. There is significant non mass enhancement surrounding this mass and extending anteriorly into the nipple base, with largest diameter in the anterior to posterior axis, measuring 7.2 centimeters. 3. If the patient would consider breast conservation, additional MR guided  core biopsies are recommended. Consider biopsy of the inferior and anterior extent of the non mass enhancement to document extent of disease. 4. Three enlarged LEFT axillary lymph nodes. 5. RIGHT  breast is negative.   11/15/2018 PET scan   PET 11/15/18 IMPRESSION: Hypermetabolic left breast lesion compatible with known primary. Hypermetabolic left axillary lymph nodes are consistent with metastatic disease.   No evidence for additional hypermetabolic metastatic involvement in the neck, chest, abdomen, or pelvis.   11/17/2018 - 03/01/2019 Chemotherapy   Neo-adjuvant Kadcyla  and perjeta  q3weeks for 6 cycles starting 11/17/18. Stopped before surgery    11/29/2018 Pathology Results   Diagnosis 1. Breast, left, needle core biopsy, inferior anterior (cylinder clip) - INVASIVE DUCTAL CARCINOMA. - DUCTAL CARCINOMA IN SITU. - LYMPHOVASCULAR INVASION IS IDENTIFIED. - SEE COMMENT. 2. Breast, left, needle core biopsy, central posterior (barbell clip) - INVASIVE DUCTAL CARCINOMA. - LYMPHOVASCULAR INVASION IS IDENTIFIED.   02/12/2019 Breast MRI   IMPRESSION: 1. Complete resolution of previously identified enhancing mass and associated non mass enhancement within the left breast. This is consistent with excellent response to chemotherapy. No residual or suspicious findings are identified. 2. No MRI evidence of malignancy on the right. 3. Previously identified left axillary lymphadenopathy not definitively seen on today's study. However, this may be due to decreased field-of-view compared to prior study.     03/14/2019 Cancer Staging   Staging form: Breast, AJCC 8th Edition - Pathologic stage from 03/14/2019: pT0, pN0, cM0, GX, ER: Unknown, PR: Unknown, HER2: Not Assessed - Signed by Lanny Callander, MD on 03/28/2019   03/14/2019 Surgery   LEFT MASTECTOMY WITH TARGETED LYMPH NODE DISSECTION and Left Axillary Sentinel Lymph  Node Biopsy by Dr Curvin 03/14/19    03/14/2019 Pathology Results   FINAL MICROSCOPIC DIAGNOSIS:   A. LYMPH NODE, LEFT, SENTINEL, BIOPSY:  - There is no evidence of carcinoma in 1 of 1 lymph node (0/1).   B. BREAST, LEFT, MASTECTOMY:  - Benign breast parenchyma with  treatment-related changes.  - There is no evidence of malignancy.  - See oncology table below.   C. LYMPH NODE, LEFT #1, SENTINEL, BIOPSY:  - There is no evidence of carcinoma in 1 of 1 lymph node (0/1).   D. LYMPH NODE, LEFT #2, SENTINEL, BIOPSY:  - There is no evidence of carcinoma in 1 of 1 lymph node (0/1).     04/04/2019 - 04/25/2019 Chemotherapy   Maintenance Herceptin  injections every 3 weeks starting 04/03/19 to complete 1 year of treatment that was started in 11/2018. Stopped after 2nd dose as she will not be repeating Echos.    06/03/2021 - 06/24/2021 Chemotherapy   Patient is on Treatment Plan : BREAST Trastuzumab  q21d X 11 Cycles     06/25/2021 - 01/01/2022 Chemotherapy   Patient is on Treatment Plan : BREAST Docetaxel  + Trastuzumab  + Pertuzumab  (THP) q21d     06/25/2021 - 01/02/2022 Chemotherapy   Patient is on Treatment Plan : BREAST Docetaxel  + Trastuzumab  + Pertuzumab  (THP) q21d     09/14/2021 Imaging   CT chest/abdomen/pelvis  IMPRESSION:  1. Interval resolution of previously seen bulky left axillary and  subpectoral lymphadenopathy. No persistently enlarged lymph nodes.  2. Multiple liver lesions are significantly diminished in size.  3. Widespread osseous metastatic disease, with an interval increase  in sclerosis of several previously lytic lesions.  4. Constellation of findings is consistent with treatment response  of metastatic disease  5. New, although age indeterminate pathologic wedge deformity of the  T6 vertebral body  as well as an increased pathologic wedge deformity  of the L2 vertebral body.  6. Coronary artery disease.  Aortic Atherosclerosis (ICD10-I70.0).    01/26/2022 - 03/14/2024 Chemotherapy   Patient is on Treatment Plan : BREAST METASTATIC Fam-Trastuzumab Deruxtecan-nxki  (Enhertu ) (5.4) q21d     Malignant neoplasm metastatic to liver ([name])  04/17/2021 Imaging   CT ABDOMEN AND PELVIS WITH CONTRAST: -New lytic lesion involving the L2 vertebral  body (6:72, 2:26) with minimal cortical breakthrough superiorly, but with preservation of vertebral body height, concerning for metastatic disease.  -There are several indeterminate hepatic lesions as described above. In addition to the lesions described above, there is an additional subtle 1.5 cm hypodensity in the hepatic dome (2:7). Given clinical history and presence of a new L2 lesion, leading differential consideration is metastatic disease. Recommend further evaluation with MRI of the abdomen with and without contrast.   05/12/2021 Imaging   MRI ABDOMEN WITH AND WITHOUT CONTRAST: Numerous heterogeneously enhancing, internally necrotic lesions in the right hepatic lobe, highly concerning for metastatic disease. Largest lesion measures up to 6.7 cm in hepatic segment VI.   Enhancing osseous lesions in the L2 vertebral body, both iliac bones, and in the sacrum, highly concerning for osseous metastases.   05/15/2021 Initial Diagnosis   Liver metastases ([name])   05/27/2021 PET scan   1. Widespread recurrent/metastatic disease in this patient who is  status post bilateral mastectomy. Left chest wall recurrence with  left axillary/subpectoral, supraclavicular nodal metastasis.  2. Hepatic, left adrenal, and widespread osseous metastasis.  3. Incidental findings, including: Right nephrolithiasis. Tiny  hiatal hernia.    05/27/2021 Imaging   MRI LUMBAR AND THORACIC SPINE: Thoracic spine:  Osseous metastatic disease involving each level. The most dramatic deposits are at T4 and T6 where there is extraosseous/epidural tumor. No cord compression but there is foraminal impingement on the left at T6-7 and on the right at T3-4. Early dorsal epidural tumor at the level of T10 and T3.   Lumbar spine:  1. Widespread osseous metastatic disease with largest deposit  replacing the L2 body where there is a compression fracture with  mild height loss. Also at this level is early epidural tumor  extension  likely affecting both L2-3 foramina.  2. 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    05/27/2021 Imaging   MRI HEAD WITH AND WITHOUT CONTRAST: Negative for metastatic disease to the brain or calvarium.   06/03/2021 - 06/24/2021 Chemotherapy   Patient is on Treatment Plan : BREAST Trastuzumab  q21d X 11 Cycles     06/25/2021 - 01/01/2022 Chemotherapy   Patient is on Treatment Plan : BREAST Docetaxel  + Trastuzumab  + Pertuzumab  (THP) q21d     06/25/2021 - 01/02/2022 Chemotherapy   Patient is on Treatment Plan : BREAST Docetaxel  + Trastuzumab  + Pertuzumab  (THP) q21d     09/14/2021 Imaging   CT chest/abdomen/pelvis  IMPRESSION:  1. Interval resolution of previously seen bulky left axillary and  subpectoral lymphadenopathy. No persistently enlarged lymph nodes.  2. Multiple liver lesions are significantly diminished in size.  3. Widespread osseous metastatic disease, with an interval increase  in sclerosis of several previously lytic lesions.  4. Constellation of findings is consistent with treatment response  of metastatic disease  5. New, although age indeterminate pathologic wedge deformity of the  T6 vertebral body as well as an increased pathologic wedge deformity  of the L2 vertebral body.  6. Coronary artery disease.  Aortic Atherosclerosis (ICD10-I70.0).    01/26/2022 -  03/14/2024 Chemotherapy   Patient is on Treatment Plan : BREAST METASTATIC Fam-Trastuzumab Deruxtecan-nxki  (Enhertu ) (5.4) q21d     Malignant neoplasm metastatic to bone ([name])  04/17/2021 Imaging   CT ABDOMEN AND PELVIS WITH CONTRAST: -New lytic lesion involving the L2 vertebral body (6:72, 2:26) with minimal cortical breakthrough superiorly, but with preservation of vertebral body height, concerning for metastatic disease.  -There are several indeterminate hepatic lesions as described above. In addition to the lesions described above, there is an additional subtle 1.5 cm hypodensity in the  hepatic dome (2:7). Given clinical history and presence of a new L2 lesion, leading differential consideration is metastatic disease. Recommend further evaluation with MRI of the abdomen with and without contrast.   05/12/2021 Imaging   MRI ABDOMEN WITH AND WITHOUT CONTRAST: Numerous heterogeneously enhancing, internally necrotic lesions in the right hepatic lobe, highly concerning for metastatic disease. Largest lesion measures up to 6.7 cm in hepatic segment VI.   Enhancing osseous lesions in the L2 vertebral body, both iliac bones, and in the sacrum, highly concerning for osseous metastases.   05/15/2021 Initial Diagnosis   Bone metastases ([name])   05/27/2021 PET scan   1. Widespread recurrent/metastatic disease in this patient who is  status post bilateral mastectomy. Left chest wall recurrence with  left axillary/subpectoral, supraclavicular nodal metastasis.  2. Hepatic, left adrenal, and widespread osseous metastasis.  3. Incidental findings, including: Right nephrolithiasis. Tiny  hiatal hernia.    05/27/2021 Imaging   MRI LUMBAR AND THORACIC SPINE: Thoracic spine:  Osseous metastatic disease involving each level. The most dramatic deposits are at T4 and T6 where there is extraosseous/epidural tumor. No cord compression but there is foraminal impingement on the left at T6-7 and on the right at T3-4. Early dorsal epidural tumor at the level of T10 and T3.   Lumbar spine:  1. Widespread osseous metastatic disease with largest deposit  replacing the L2 body where there is a compression fracture with  mild height loss. Also at this level is early epidural tumor  extension likely affecting both L2-3 foramina.  2. 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    05/27/2021 Imaging   MRI HEAD WITH AND WITHOUT CONTRAST: Negative for metastatic disease to the brain or calvarium.   06/03/2021 - 06/24/2021 Chemotherapy   Patient is on Treatment Plan : BREAST  Trastuzumab  q21d X 11 Cycles     06/25/2021 - 01/01/2022 Chemotherapy   Patient is on Treatment Plan : BREAST Docetaxel  + Trastuzumab  + Pertuzumab  (THP) q21d     06/25/2021 - 01/02/2022 Chemotherapy   Patient is on Treatment Plan : BREAST Docetaxel  + Trastuzumab  + Pertuzumab  (THP) q21d     09/14/2021 Imaging   CT chest/abdomen/pelvis  IMPRESSION:  1. Interval resolution of previously seen bulky left axillary and  subpectoral lymphadenopathy. No persistently enlarged lymph nodes.  2. Multiple liver lesions are significantly diminished in size.  3. Widespread osseous metastatic disease, with an interval increase  in sclerosis of several previously lytic lesions.  4. Constellation of findings is consistent with treatment response  of metastatic disease  5. New, although age indeterminate pathologic wedge deformity of the  T6 vertebral body as well as an increased pathologic wedge deformity  of the L2 vertebral body.  6. Coronary artery disease.  Aortic Atherosclerosis (ICD10-I70.0).    01/26/2022 - 03/14/2024 Chemotherapy   Patient is on Treatment Plan : BREAST METASTATIC Fam-Trastuzumab Deruxtecan-nxki  (Enhertu ) (5.4) q21d       Interval history: The patient  presents to chemo care clinic today for initial meeting in preparation for starting chemotherapy. I introduced the chemo care clinic and we discussed that the role of the clinic is to assist those who are at an increased risk of emergency room visits and/or complications during the course of chemotherapy treatment. We discussed that the increased risk takes into account factors such as age, performance status, and co-morbidities. We also discussed that for some, this might include barriers to care such as not having a primary care provider, lack of insurance/transportation, or not being able to afford medications. We discussed that the goal of the program is to help prevent unplanned ER visits and help reduce complications during  chemotherapy. We do this by discussing specific risk factors to each individual and identifying ways that we can help improve these risk factors and reduce barriers to care.   No Known Allergies  Past Medical History:  Diagnosis Date   Arthritis    shoulder   Breast cancer ([name])    Cancer ([name])    left breast ca   Colon cancer ([name])    [name], stage II 11/12/2011   Sigmoid lesion 5.9 cm  40 nodes negative but focus of cancer in a diverticum   Pre-op CEA 5.2 with lab normal up to 2.5 resected 09/21/05   Xeloda adjuvant chemotherapy   Hypertension    Polyneuropathy 02/03/2023   Right shoulder pain 04/12/2023    Past Surgical History:  Procedure Laterality Date   COLONOSCOPY     HEMICOLECTOMY  2007   MASTECTOMY WITH AXILLARY LYMPH NODE DISSECTION Left 03/14/2019   Procedure: LEFT MASTECTOMY WITH TARGETED LYMPH NODE DISSECTION;  Surgeon: Curvin Deward MOULD, MD;  Location: [facility] OR;  Service: General;  Laterality: Left;   SENTINEL NODE BIOPSY Left 03/14/2019   Procedure: Left Axillary Sentinel Lymph  Node Biopsy;  Surgeon: Curvin Deward MOULD, MD;  Location: [facility] OR;  Service: General;  Laterality: Left;   TONSILLECTOMY     WISDOM TOOTH EXTRACTION      Social History   Socioeconomic History   Marital status: Married    Spouse name: Not on file   Number of children: 3   Years of education: Not on file   Highest education level: Not on file  Occupational History   Not on file  Tobacco Use   Smoking status: Never   Smokeless tobacco: Never  Vaping Use   Vaping status: Never Used  Substance and Sexual Activity   Alcohol use: Never   Drug use: Never   Sexual activity: Not on file  Other Topics Concern   Not on file  Social History Narrative   Not on file   Social Drivers of Health   Financial Resource Strain: Not on file  Food Insecurity: No Food Insecurity (12/07/2023)   [facility]   Hunger Vital Sign    Within the past 12 months, you worried that your  food would run out before you got the money to buy more.: Never true    Within the past 12 months, the food you bought just didn't last and you didn't have money to get more.: Never true  Transportation Needs: No Transportation Needs (12/07/2023)   Received from [facility] - Transportation    Lack of Transportation (Medical): No    Lack of Transportation (Non-Medical): No  Physical Activity: Not on file  Stress: Not on file  Social Connections: Not on file  Intimate Partner  Violence: Not on file    Family History  Problem Relation Age of Onset   Cancer Maternal Aunt        colon cancer      Current Outpatient Medications:    Biotin 1 MG CAPS, Take by mouth., Disp: , Rfl:    bisacodyl (DULCOLAX) 5 MG EC tablet, Take 5 mg by mouth daily as needed for moderate constipation. 2 capsules every night, Disp: , Rfl:    co-enzyme Q-10 30 MG capsule, Take 100 mg by mouth daily., Disp: , Rfl:    fish oil-omega-3 fatty acids 1000 MG capsule, Take 1 g by mouth daily., Disp: , Rfl:    MAGNESIUM PO, Take 1 tablet by mouth every evening. 600 mg, Disp: , Rfl:    Multiple Vitamin (MULTIVITAMIN) capsule, Take 1 capsule by mouth daily., Disp: , Rfl:    neomycin-polymyxin b-dexamethasone  (MAXITROL) 3.5-10000-0.1 SUSP, SMARTSIG:In Eye(s), Disp: , Rfl:    ondansetron  (ZOFRAN ) 4 MG tablet, Take 1 tablet (4 mg total) by mouth every 8 (eight) hours as needed for nausea or vomiting. (Patient not taking: Reported on 01/11/2024), Disp: 20 tablet, Rfl: 2   polyethylene glycol (MIRALAX / GLYCOLAX) 17 g packet, Take 17 g by mouth as needed. constipation, Disp: , Rfl:    triamcinolone  cream (KENALOG ) 0.1 %, Apply 1 Application topically 2 (two) times daily. (Patient taking differently: Apply 1 Application topically 2 (two) times daily as needed.), Disp: 30 g, Rfl: 1     Latest Ref Rng & Units 04/04/2024    1:05 PM  CMP  Glucose 70 - 99 mg/dL 97   BUN 8 - 23 mg/dL 20   Creatinine 9.55 - 1.00 mg/dL  9.05   Sodium 864 - 854 mmol/L 142   Potassium 3.5 - 5.1 mmol/L 4.5   Chloride 98 - 111 mmol/L 108   CO2 22 - 32 mmol/L 25   Calcium 8.9 - 10.3 mg/dL 9.8   Total Protein 6.5 - 8.1 g/dL 6.5   Total Bilirubin 0.0 - 1.2 mg/dL 0.5   Alkaline Phos 38 - 126 U/L 68   AST 15 - 41 U/L 47   ALT 0 - 44 U/L 24       Latest Ref Rng & Units 04/04/2024    1:05 PM  CBC  WBC 4.0 - 10.5 K/uL 4.1   Hemoglobin 12.0 - 15.0 g/dL 88.1   Hematocrit 63.9 - 46.0 % 35.3   Platelets 150 - 400 K/uL 162     No images are attached to the encounter.  CT CHEST ABDOMEN PELVIS W CONTRAST Result Date: 03/10/2024 CLINICAL DATA:  Breast cancer restaging, assess treatment response, status post left mastectomy with ongoing chemotherapy, additional history of colon cancer and resection * Tracking Code: BO * EXAM: CT CHEST, ABDOMEN, AND PELVIS WITH CONTRAST TECHNIQUE: Multidetector CT imaging of the chest, abdomen and pelvis was performed following the standard protocol during bolus administration of intravenous contrast. RADIATION DOSE REDUCTION: This exam was performed according to the departmental dose-optimization program which includes automated exposure control, adjustment of the mA and/or kV according to patient size and/or use of iterative reconstruction technique. CONTRAST:  OMNIPAQUE  IOHEXOL  300 MG/ML SOLN, additional oral enteric contrast COMPARISON:  08/11/2023 FINDINGS: CT CHEST FINDINGS Cardiovascular: Right chest port catheter. Aortic atherosclerosis. Mild cardiomegaly. Left and right coronary artery calcifications. No pericardial effusion. Mediastinum/Nodes: No enlarged mediastinal, hilar, or axillary lymph nodes. Thyroid gland, trachea, and esophagus demonstrate no significant findings. Lungs/Pleura: Lungs are clear. No pleural effusion  or pneumothorax. Musculoskeletal: Left mastectomy and axillary lymph node dissection. No acute osseous findings. CT ABDOMEN PELVIS FINDINGS Hepatobiliary: No solid liver  abnormality is seen. No gallstones, gallbladder wall thickening, or biliary dilatation. Pancreas: Unremarkable. No pancreatic ductal dilatation or surrounding inflammatory changes. Spleen: Normal in size without significant abnormality. Adrenals/Urinary Tract: Significant interval enlargement of a left adrenal mass, measuring 3.1 x 2.8 cm, previously 1.2 x 1.0 cm (series 301, image 66). Normal right adrenal. Kidneys are normal, without renal calculi, solid lesion, or hydronephrosis. Bladder is unremarkable. Stomach/Bowel: Stomach is within normal limits. Appendix appears normal. No evidence of bowel wall thickening, distention, or inflammatory changes. Vascular/Lymphatic: Aortic atherosclerosis. Newly enlarged left retroperitoneal lymph node measuring 1.8 x 1.1 cm (series 301, image 61). Reproductive: No mass or other abnormality. Other: No abdominal wall hernia or abnormality. No ascites. Musculoskeletal: No acute osseous findings. Unchanged widespread sclerotic osseous metastatic disease throughout the axial and proximal appendicular skeleton. Unchanged pathologic wedge deformities of T6 and L2. IMPRESSION: 1. Significant interval enlargement of a left adrenal mass, consistent with worsened adrenal metastatic disease. 2. Newly enlarged left retroperitoneal lymph node, consistent with nodal metastatic disease. 3. Unchanged widespread sclerotic osseous metastatic disease throughout the axial and proximal appendicular skeleton. Unchanged pathologic wedge deformities of T6 and L2. 4. Left mastectomy and axillary lymph node dissection. 5. Coronary artery disease. Aortic Atherosclerosis (ICD10-I70.0). Electronically Signed   By: Marolyn JONETTA Jaksch M.D.   On: 03/10/2024 21:53     Assessment and plan:  The patient is a 89 y.o. female who presents to Chemo Care Clinic for initial meeting in preparation for starting chemotherapy for the treatment of No diagnosis found..   Chemo Care Clinic/High Risk for ER/Hospitalization  during chemotherapy- We discussed the role of the chemo care clinic and identified patient specific risk factors. I discussed that patient was identified as high risk primarily based on:  Patient has past medical history positive for: Past Medical History:  Diagnosis Date   Arthritis    shoulder   Breast cancer ([name])    Cancer ([name])    left breast ca   Colon cancer ([name])    [name], stage II 11/12/2011   Sigmoid lesion 5.9 cm  40 nodes negative but focus of cancer in a diverticum   Pre-op CEA 5.2 with lab normal up to 2.5 resected 09/21/05   Xeloda adjuvant chemotherapy   Hypertension    Polyneuropathy 02/03/2023   Right shoulder pain 04/12/2023    Patient has past surgical history positive for: Past Surgical History:  Procedure Laterality Date   COLONOSCOPY     HEMICOLECTOMY  2007   MASTECTOMY WITH AXILLARY LYMPH NODE DISSECTION Left 03/14/2019   Procedure: LEFT MASTECTOMY WITH TARGETED LYMPH NODE DISSECTION;  Surgeon: Curvin Deward MOULD, MD;  Location: [facility] OR;  Service: General;  Laterality: Left;   SENTINEL NODE BIOPSY Left 03/14/2019   Procedure: Left Axillary Sentinel Lymph  Node Biopsy;  Surgeon: Curvin Deward MOULD, MD;  Location: [facility] OR;  Service: General;  Laterality: Left;   TONSILLECTOMY     WISDOM TOOTH EXTRACTION      Provided general information including the following:  1.  Date of education: 04/04/2024 2.  Physician name: Dr. Wanda Cornish 3.  Diagnosis: Breast cancer 4.  Stage: Recurrent 5.  Control 6.  Chemotherapy plan including drugs and how often: ado-trastuzumab  every 3 weeks 7.  Start date: 04/18/2024 8.  Other referrals: None at this time 9.  The patient is to call our office  with any questions or concerns.  Our office number [phone], if after hours or on the weekend, call the same number and wait for the answering service.  There is always an oncologist on call. 10.  Medications prescribed: ondansetron , prochlorperazine to be sent by Dr.  Cornelius 11.  The patient has verbalized understanding of the treatment plan and has no barriers to adherence or understanding.   Obtained signed consent from patient.   Discussed symptoms including:  1.  Low blood counts including white blood cells red blood cells, and platelets.  If experience increased fatigue or abnormal bruising or bleeding, call our office. 2.  Infection including to avoid large crowds, wash hands frequently, and stay away from people who were sick.  If fever develops of 100.4 or higher, call our office. 3.  Mucositis:  Instructions on mouth rinse given (baking soda and salt mixture).  Keep mouth clean.  Use soft bristle toothbrush.  Avoid alcohol containing mouthwash.  If mouth sores develop, call our office. 4.  Nausea/vomiting:  Prescriptions given: ondansetron  8 mg every 8 hours as needed for nausea or vomiting and prochlorperazine 10 mg every 6 hours as needed for nausea or vomiting, may alternate these medications and take around the clock if persistent.  If nausea and vomiting is not controlled, call our office 5.  Diarrhea: Use over-the-counter Imodium.  Call our office if diarrhea is not controlled. 6.  Constipation: Use senna-S, 1 to 2 tablets twice a day.  Call our office if no BM in 2 to 3 days. 7.  Loss of appetite:  Try to eat small meals every 2-3 hours.  Call our office if not able to eat or drink. 8.  Taste changes:  Try zinc 50 mg daily.  If becomes severe call clinic. 9.  Drink 2 to 3 quarts of water per day. Call our office if not able to drink enough for urine to be pale yellow. 10. Avoid alcoholic beverages. 11. Peripheral neuropathy: Call office if numbness or tingling in hands or feet worsens or is suddenly severe. 12.  Ringing in the ears or hearing loss.  Call our office if this develops.    The patient was given written information printed from [employer] patient education on individual chemotherapy agents which includes: Name of  medications Approved uses Dose and schedule Storage and handling Handling body fluids and waste Drug and food interactions Possible side effects and management Pregnancy, sexual activity, and contraception Obtaining medication   Gave information on the supportive care team and how to contact them regarding services.  Discussed advanced directives.  The patient does not have their advanced directives.   We discussed that social determinants of health may have significant impacts on health and outcomes for cancer patients.  Today we discussed specific social determinants of performance status, alcohol use, depression, financial needs, food insecurity, housing, interpersonal violence, social connections, stress, tobacco use, and transportation.    After lengthy discussion the following were identified as areas of need:   Outpatient services: We discussed options including home based and outpatient services, DME, nutrition counseling, and supportive care program. We discussed that patients who participate in regular physical activity report fewer negative impacts of cancer and treatments and report less fatigue.   Financial Concerns: We discussed that living with cancer can create tremendous financial burden.  We discussed options for assistance. I asked that if assistance is needed in affording medications or paying bills to please let us  know so that we can provide assistance.  We discussed options for food including social services.  Referral to Social work:  Introduced services available, such as support with utility bill, cell phone and gas vouchers.  Introduced [place], [place] who can provide individual counseling.    Support groups: We discussed options for support groups at [facility]. We discussed options for managing stress including healthy eating and exercise, as well as participating in no charge counseling services at the cancer center and support groups.  If these are of  interest, patient can notify either myself or primary nursing team.We discussed options for management including medications.  Transportation: We discussed options for transportation.  The patient will contact our office if she requires assistance with transportation.  Palliative care services: We have palliative care services available in the cancer center to discuss goals of care and advanced care planning.  Please let us  know if you have any questions or would like to speak to our palliative care practitioner.  Symptom Management Clinic: We discussed our symptom management clinic which is available for acute concerns while receiving treatment such as nausea, vomiting or diarrhea.  We can be reached via telephone at [phone].  We are available for virtual or in person visits on the same day from 9 to 4 PM Monday through Friday.   She denies needing specific assistance at this time.  I will refer her to our dietitian for support during therapy.  She will be followed by Dr. Yuvonne clinical team.   Disposition: RTC on 04/25/2024  Visit Diagnosis No diagnosis found.  I discussed the assessment and treatment plan with the patient.  The patient was provided an opportunity to ask questions and all were answered. The patient expressed understanding and was in agreement with this plan. She also understands that she can call clinic at any time with any questions, concerns, or complaints.   I provided 15 minutes of face-to-face time during this encounter, all of which was spent counseling as documented under my assessment & plan.   Iktan Aikman A. [place], PA-C [facility] [phone]

## 2024-04-04 NOTE — Progress Notes (Signed)
 ZOMETA  WAS HELD TODAY.

## 2024-04-04 NOTE — Progress Notes (Signed)
 [facility]  [address] [place],  [place]  [address] [phone]  Clinic Day: 04/04/24  Referring physician: [name], MD  ASSESSMENT & PLAN:  Assessment: Malignant neoplasm of upper-outer quadrant of left breast in female, estrogen receptor negative ([name]) History of stage IIB (T2c N1 M0) HER2 receptor positive, ER/PR negative, left breast cancer diagnosed in June 2020.  She was treated with neoadjuvant Kadcyla /Perjeta  followed by left mastectomy.  She had a complete response in breast and nodes. She had only received two doses of adjuvant trastuzumab  before discontinuing in December 2020. She developed recurrent disease in 2022.   Malignant neoplasm metastatic to liver [facility]) Numerous heterogeneously enhancing, internally necrotic lesions in the right hepatic lobe CT in December 2023, which was consistent with metastatic disease. Largest lesion measures up to 6.7 cm and there are at least 6 lesions.  Liver biopsy was consistent with metastatic breast cancer with negative estrogen and progesterone receptors and positive HER2 receptor. She was initially treated with trastuzumab  with an excellent response with significant decrease in the size of her liver lesions and sustained response. She later had progression of this disease in the chest wall and was switched to Enhertu .  CT chest, abdomen, and pelvis in March revealed stable subtle liver lesions with at least 1 lesion is no longer visualized. CT chest, abdomen and pelvis in October, 2024 revealed a decrease in the hepatic lesions with no new lesions seen.  CT imaging in April was fairly stable except a slight increase in the left adrenal nodule. Current scanning now shows that nodule to be dramatically enlarged to 3.1cm in addition to a new retroperitoneal node measuring 1.8cm. I reviewed these results with her in detail and recommend a change in therapy but we were awaiting the results of the Guardant testing. 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 The previous liver metastases are no longer visible.    Malignant neoplasm metastatic to bone [facility]) Bone metastases diagnosed in December, 2022, for which she was placed on zoledronic  acid every 4 weeks. She is now receiving Zometa  every 6 weeks to correspond with her chemotherapy. CT imaging in April, 2025 did not reveal any evidence of progressive disease. He bone lesions appear stable.   Chest wall recurrence of breast cancer, left ([name]) Left chest wall recurrence diagnosed in January 2023.  Biopsy revealed carcinoma consistent with her previous breast cancer with negative estrogen and progesterone receptors and positive HER2 receptor.  She was initially treated with docetaxel /trastuzumab  and received 10 cycles with a good response.  She then had recurrence in the left chest wall in September 2023, so was switched to Enhertu  with good response.  There is no recurrence in the left chest wall at this time.    Polyneuropathy She has had chronic left hand pain, initially thought to be related to lymphedema and neuropathy.  Her lymphedema was controlled. She has decreased strength and muscle wasting.  She was referred to neurology for further evaluation.  EMG revealed polyneuropathy.  She has now seen orthopedics and had an MRI of the left brachial plexus that was unremarkable, but limited due to patient motion.  She had been on gabapentin  100 mg at bedtime, but discontinued that on her own. She saw Dr. [name] in neurology in October 2024 and underwent repeat EMG, which was more abnormal. She has been seen by Dr. [name] with [name] in [place], who did not feel surgery was a good option. The patient no longer sees occupational therapy, but  does exercises at home.  Epiphora She is very frustrated by this. She informed me that she was told about a procedure where they can place stents (pyrex tubes) in her eyes tear ducts to help with her excessive tearing. She has made an appointment with  her ophthalmologist, Dr. Hughie Bail in [place].  I encouraged her to pursue this as we know certain agents such as docetaxel  can cause pannicular stenosis of the tear ducts.  This has improved.   Plan: CT chest, abdomen, and pelvis done on 03/08/2024 revealed a significant interval enlargement of a left adrenal mass consistent with worsened adrenal metastatic disease and a newly enlarged left retroperitoneal lymph node consistent with nodal metastatic disease. She has unchanged widespread sclerotic osseous metastatic disease throughout the axial and proximal appendicular skeleton and unchanged pathologic wedge deformities of T6 and L2. The liver appears clear. She had a Guardant 360 drawn on 03/14/2024. 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 I explained to her and her son these findings in detail and that this does have a lot of toxicities, so this will not be our first choice. I reminded her that she was previously on neoadjuvant Kadcyla  which she tolerated and had benefit with this. I reassured her that I believe she will be able to tolerate this treatment. We discussed possible side effects such as fatigue, nausea, vomiting, and diarrhea. She will have her chemoeducation today and we will plan treatment on December, 10th. She has a WBC of 4.1, low hemoglobin of 11.8 down from 12.6, and platelet count of 162,000. Her CMP is normal other than an chronic elevated AST of 47. She received her Xgeva injection today and will be due for her next one on December 31st. She will then get it every 6 weeks as usual. I will see her back on December 31st with CBC and CMP for her second cycle of the Kadcyla .  The patient understands the plans discussed today and is in agreement with  them.  She knows to contact our office if she develops concerns prior to her next visit.   I provided 40 minutes of face-to-face time during this encounter and > 50% was spent counseling as documented under my assessment and plan.   [name], MD  [facility] - A DEPT OF [name] [address] [place] [address] Dept: [phone] Dept Fax: [phone]   [name].  CHIEF COMPLAINT:  CC: Recurrent HER2 receptor positive breast cancer  Current Treatment: Enhertu  every 3 weeks  HISTORY OF PRESENT ILLNESS:  Jasmin Lewis is a 89 year old woman with recurrent HER2 receptor positive. She was diagnosed with stage IIB (T2 N1 M0) HER2 receptor positive and hormone receptor negative left breast cancer in June 2020 when she was able to palpate a mass of the left breast. Mammogram and ultrasound in May 2020 revealed a 2.9 cm irregular mass in the upper outer left breast as well as two adjacent abnormal left axillary lymph nodes. Biopsy was obtained on June 3rd and revealed invasive ductal carcinoma and DCIS with lymphovascular invasion. Left axillary lymph node was positive for invasive ductal carcinoma. HER2 was positive 3+. Estrogen and progesterone receptors were both negative  at 0%, and Ki67 was 70%. Breast MRI from July revealed a 3.3 cm mass of the lateral portion of the left breast as well as significant non mass enhancement surrounding this mass and extending anteriorly into the nipple base, with largest diameter in the anterior to posterior axis, measuring 7.2 centimeters. Three enlarged left axillary lymph nodes. Right breast was negative.  PET imaging in July revealed no evidence of additional metastatic involvement. She received neoadjuvant trastuzumab /pertuzumab  for 6 cycles. Repeat breast MRI in October revealed complete resolution of the enhancing mass and associated  non mass enhancement within the left breast. Previously identified left axillary lymphadenopathy was no longer identified. She underwent left mastectomy and targeted lymph node dissection in November 2020.  Pathology revealed evidence of malignancy within the breast and three sentinel lymph nodes were negative for malignancy (0/3). She continued maintenance trastuzumab  injections for two more doses, but this was discontinued in December 2020 as she refused further ECHO scans.    Of note, she also has a history of stage II colon cancer, diagnosed in May 2007. Preop CEA was 5.2. She underwent surgical resection and surgical pathology revealed a 5.9 x 3.8 x 1.2 cm moderate to well-differentiated adenocarcinoma penetrating the muscular wall. Forty lymph nodes negative. No vascular or lymphatic invasion. Multiple diverticula with microabscess formation and inflammation. Carcinoma present in at least 1 diverticulum. She received adjuvant chemotherapy with capecitabine for 9 months. Colonoscopy from August 2013 revealed 2 polyps which were resected and chronic diverticulosis.  In December 2022, she was evaluated at the [facility] ED for abdominal and back pain.  CT abdomen pelvis revealed new lytic lesion involving the L2 vertebral body concerning for metastatic disease, as well as multiple hepatic rim-enhancing fluid collections with significant surrounding edema and an additional subtle 1.5 cm hypodensity in the hepatic dome. Given clinical history and presence of a new L2 lesion, leading differential consideration is metastatic disease. MRI abdomen in revealed numerous heterogeneously enhancing, internally necrotic lesions in the right hepatic lobe, highly concerning for metastatic disease. Largest lesion measures up to 6.7 cm in hepatic segment VI. Enhancing osseous lesions in the L2 vertebral body, both iliac bones, and in the sacrum, highly concerning for osseous metastases was also seen.    PET scan revealed  widespread hypermetabolic disease in the left chest wall, left axillary, subpectoral and supraclavicular nodes, liver, left adrenal and bone.  MRI thoracic/lumbar spine revealed widespread osseous metastasis with the largest deposit replacing L2 vertebral body where there was a compression fracture with mild height loss and early epidural tumor extension affecting L2-3 foramina.  MRI brain was negative for metastatic disease.  Biopsy of of the left chest wall  was consistent with recurrent ductal carcinoma of the breast.  HER2 was positive, estrogen and progesterone receptors were negative.  She was initially treated with trastuzumab , as well as zoledronic  acid, beginning in January 2023 with an initial response.   Oncology History Overview Note  Cancer Staging Malignant neoplasm of upper-outer quadrant of left breast in female, estrogen receptor negative ([name]) Staging form: Breast, AJCC 8th Edition - Clinical stage from 10/11/2018: Stage IIB (cT2, cN1, cM0, G3, ER-, PR-, HER2+) - Signed by Lanny Callander, MD on 11/02/2018 - Clinical: No stage assigned - Unsigned    History of colon cancer, stage II  09/2005 Initial Diagnosis   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 Findings were a 5.9 x 3.8  x 1.2 cm moderate to well-differentiated adenocarcinoma penetrating the muscular wall. Forty lymph nodes negative. No vascular or lymphatic invasion. Multiple diverticula with microabscess formation and inflammation. Carcinoma present in at least 1 diverticulum. Preop CEA 5.2 with lab normal range 0 to 2.5. Preop CT scan with no obvious additional pathology.    2007 -  Chemotherapy   She received oral Xeloda chemotherapy as an adjuvant. She declined treatment on a clinical trial.    11/12/2011 Initial Diagnosis   [name], stage II   12/09/2011 Procedure   Followup colonoscopy done on 12/09/2011. She was found  to have 2 polyps which were removed. Chronic diverticulosis.     Malignant neoplasm of upper-outer quadrant of left breast in female, estrogen receptor negative ([name])  09/26/2018 Mammogram   Mammogram/US  of left breast 09/26/18 IMPRESSION:  1. 2.9cm irregular mass in the upper outer left breast corresponds to the palpable abnormality. This is highly suspicious for breast carcinoma.  2. Two adjacent abnormal left axillary  Lymph nodes suspicious for metastatic adenopathy. There is a third borderline abnormal left axillary LN with a cortex thickened to 4mm.  3. Benign right breast cyst. No evidence of right breast malignancy.    10/11/2018 Cancer Staging   Staging form: Breast, AJCC 8th Edition - Clinical stage from 10/11/2018: Stage IIB (cT2, cN1, cM0, G3, ER-, PR-, HER2+) - Signed by Lanny Callander, MD on 11/02/2018   10/11/2018 Initial Biopsy   Diagnosis 10/11/18 1. Breast, left, needle core biopsy, upper outer left 2 o'clock - INVASIVE DUCTAL CARCINOMA. - DUCTAL CARCINOMA IN SITU. - LYMPHOVASCULAR INVASION IS IDENTIFIED. - SEE COMMENT. 2. Lymph node, needle/core biopsy, left axilla - INVASIVE DUCTAL CARCINOMA. - SEE COMMENT.   10/11/2018 Receptors her2   The tumor cells are POSITIVE for Her2 (3+). Estrogen Receptor: 0%, NEGATIVE Progesterone Receptor: 0%, NEGATIVE Proliferation Marker Ki67: 70%   11/02/2018 Initial Diagnosis   Malignant neoplasm of upper-outer quadrant of left breast in female, estrogen receptor negative ([name])   11/15/2018 Breast MRI   MRI breast 11/15/18  IMPRESSION: 1. 3.3 centimeter mass in the LATERAL portion of the LEFT breast consistent with known malignancy. 2. There is significant non mass enhancement surrounding this mass and extending anteriorly into the nipple base, with largest diameter in the anterior to posterior axis, measuring 7.2 centimeters. 3. If the patient would consider breast conservation, additional MR guided core biopsies are recommended. Consider  biopsy of the inferior and anterior extent of the non mass enhancement to document extent of disease. 4. Three enlarged LEFT axillary lymph nodes. 5. RIGHT breast is negative.   11/15/2018 PET scan   PET 11/15/18 IMPRESSION: Hypermetabolic left breast lesion compatible with known primary. Hypermetabolic left axillary lymph nodes are consistent with metastatic disease.   No evidence for additional hypermetabolic metastatic involvement in the neck, chest, abdomen, or pelvis.   11/17/2018 - 03/01/2019 Chemotherapy   Neo-adjuvant Kadcyla  and perjeta  q3weeks for 6 cycles starting 11/17/18. Stopped before surgery    11/29/2018 Pathology Results   Diagnosis 1. Breast, left, needle core biopsy, inferior anterior (cylinder clip) - INVASIVE DUCTAL CARCINOMA. - DUCTAL CARCINOMA IN SITU. - LYMPHOVASCULAR INVASION IS IDENTIFIED. - SEE COMMENT. 2. Breast, left, needle core biopsy, central posterior (barbell clip) - INVASIVE DUCTAL CARCINOMA. - LYMPHOVASCULAR INVASION IS IDENTIFIED.   02/12/2019 Breast MRI   IMPRESSION: 1. Complete resolution of previously identified enhancing mass and associated non mass enhancement within the left breast. This is consistent with excellent response to chemotherapy. No residual or  suspicious findings are identified. 2. No MRI evidence of malignancy on the right. 3. Previously identified left axillary lymphadenopathy not definitively seen on today's study. However, this may be due to decreased field-of-view compared to prior study.     03/14/2019 Cancer Staging   Staging form: Breast, AJCC 8th Edition - Pathologic stage from 03/14/2019: pT0, pN0, cM0, GX, ER: Unknown, PR: Unknown, HER2: Not Assessed - Signed by Lanny Callander, MD on 03/28/2019   03/14/2019 Surgery   LEFT MASTECTOMY WITH TARGETED LYMPH NODE DISSECTION and Left Axillary Sentinel Lymph  Node Biopsy by Dr Curvin 03/14/19    03/14/2019 Pathology Results   FINAL MICROSCOPIC DIAGNOSIS:   A. LYMPH NODE,  LEFT, SENTINEL, BIOPSY:  - There is no evidence of carcinoma in 1 of 1 lymph node (0/1).   B. BREAST, LEFT, MASTECTOMY:  - Benign breast parenchyma with treatment-related changes.  - There is no evidence of malignancy.  - See oncology table below.   C. LYMPH NODE, LEFT #1, SENTINEL, BIOPSY:  - There is no evidence of carcinoma in 1 of 1 lymph node (0/1).   D. LYMPH NODE, LEFT #2, SENTINEL, BIOPSY:  - There is no evidence of carcinoma in 1 of 1 lymph node (0/1).     04/04/2019 - 04/25/2019 Chemotherapy   Maintenance Herceptin  injections every 3 weeks starting 04/03/19 to complete 1 year of treatment that was started in 11/2018. Stopped after 2nd dose as she will not be repeating Echos.    06/03/2021 - 06/24/2021 Chemotherapy   Patient is on Treatment Plan : BREAST Trastuzumab  q21d X 11 Cycles     06/25/2021 - 01/01/2022 Chemotherapy   Patient is on Treatment Plan : BREAST Docetaxel  + Trastuzumab  + Pertuzumab  (THP) q21d     06/25/2021 - 01/02/2022 Chemotherapy   Patient is on Treatment Plan : BREAST Docetaxel  + Trastuzumab  + Pertuzumab  (THP) q21d     09/14/2021 Imaging   CT chest/abdomen/pelvis  IMPRESSION:  1. Interval resolution of previously seen bulky left axillary and  subpectoral lymphadenopathy. No persistently enlarged lymph nodes.  2. Multiple liver lesions are significantly diminished in size.  3. Widespread osseous metastatic disease, with an interval increase  in sclerosis of several previously lytic lesions.  4. Constellation of findings is consistent with treatment response  of metastatic disease  5. New, although age indeterminate pathologic wedge deformity of the  T6 vertebral body as well as an increased pathologic wedge deformity  of the L2 vertebral body.  6. Coronary artery disease.  Aortic Atherosclerosis (ICD10-I70.0).    01/26/2022 - 03/14/2024 Chemotherapy   Patient is on Treatment Plan : BREAST METASTATIC Fam-Trastuzumab Deruxtecan-nxki  (Enhertu ) (5.4) q21d      04/18/2024 -  Chemotherapy   Patient is on Treatment Plan : BREAST ADO-Trastuzumab Emtansine  (Kadcyla ) q21d     Malignant neoplasm metastatic to liver ([name])  04/17/2021 Imaging   CT ABDOMEN AND PELVIS WITH CONTRAST: -New lytic lesion involving the L2 vertebral body (6:72, 2:26) with minimal cortical breakthrough superiorly, but with preservation of vertebral body height, concerning for metastatic disease.  -There are several indeterminate hepatic lesions as described above. In addition to the lesions described above, there is an additional subtle 1.5 cm hypodensity in the hepatic dome (2:7). Given clinical history and presence of a new L2 lesion, leading differential consideration is metastatic disease. Recommend further evaluation with MRI of the abdomen with and without contrast.   05/12/2021 Imaging   MRI ABDOMEN WITH AND WITHOUT CONTRAST: Numerous heterogeneously enhancing, internally necrotic lesions in  the right hepatic lobe, highly concerning for metastatic disease. Largest lesion measures up to 6.7 cm in hepatic segment VI.   Enhancing osseous lesions in the L2 vertebral body, both iliac bones, and in the sacrum, highly concerning for osseous metastases.   05/15/2021 Initial Diagnosis   Liver metastases ([name])   05/27/2021 PET scan   1. Widespread recurrent/metastatic disease in this patient who is  status post bilateral mastectomy. Left chest wall recurrence with  left axillary/subpectoral, supraclavicular nodal metastasis.  2. Hepatic, left adrenal, and widespread osseous metastasis.  3. Incidental findings, including: Right nephrolithiasis. Tiny  hiatal hernia.    05/27/2021 Imaging   MRI LUMBAR AND THORACIC SPINE: Thoracic spine:  Osseous metastatic disease involving each level. The most dramatic deposits are at T4 and T6 where there is extraosseous/epidural tumor. No cord compression but there is foraminal impingement on the left at T6-7 and on the right at T3-4. Early dorsal  epidural tumor at the level of T10 and T3.   Lumbar spine:  1. Widespread osseous metastatic disease with largest deposit  replacing the L2 body where there is a compression fracture with  mild height loss. Also at this level is early epidural tumor  extension likely affecting both L2-3 foramina.  2. 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    05/27/2021 Imaging   MRI HEAD WITH AND WITHOUT CONTRAST: Negative for metastatic disease to the brain or calvarium.   06/03/2021 - 06/24/2021 Chemotherapy   Patient is on Treatment Plan : BREAST Trastuzumab  q21d X 11 Cycles     06/25/2021 - 01/01/2022 Chemotherapy   Patient is on Treatment Plan : BREAST Docetaxel  + Trastuzumab  + Pertuzumab  (THP) q21d     06/25/2021 - 01/02/2022 Chemotherapy   Patient is on Treatment Plan : BREAST Docetaxel  + Trastuzumab  + Pertuzumab  (THP) q21d     09/14/2021 Imaging   CT chest/abdomen/pelvis  IMPRESSION:  1. Interval resolution of previously seen bulky left axillary and  subpectoral lymphadenopathy. No persistently enlarged lymph nodes.  2. Multiple liver lesions are significantly diminished in size.  3. Widespread osseous metastatic disease, with an interval increase  in sclerosis of several previously lytic lesions.  4. Constellation of findings is consistent with treatment response  of metastatic disease  5. New, although age indeterminate pathologic wedge deformity of the  T6 vertebral body as well as an increased pathologic wedge deformity  of the L2 vertebral body.  6. Coronary artery disease.  Aortic Atherosclerosis (ICD10-I70.0).    01/26/2022 - 03/14/2024 Chemotherapy   Patient is on Treatment Plan : BREAST METASTATIC Fam-Trastuzumab Deruxtecan-nxki  (Enhertu ) (5.4) q21d     04/18/2024 -  Chemotherapy   Patient is on Treatment Plan : BREAST ADO-Trastuzumab Emtansine  (Kadcyla ) q21d     Malignant neoplasm metastatic to bone ([name])  04/17/2021 Imaging   CT ABDOMEN AND PELVIS  WITH CONTRAST: -New lytic lesion involving the L2 vertebral body (6:72, 2:26) with minimal cortical breakthrough superiorly, but with preservation of vertebral body height, concerning for metastatic disease.  -There are several indeterminate hepatic lesions as described above. In addition to the lesions described above, there is an additional subtle 1.5 cm hypodensity in the hepatic dome (2:7). Given clinical history and presence of a new L2 lesion, leading differential consideration is metastatic disease. Recommend further evaluation with MRI of the abdomen with and without contrast.   05/12/2021 Imaging   MRI ABDOMEN WITH AND WITHOUT CONTRAST: Numerous heterogeneously enhancing, internally necrotic lesions in the right hepatic lobe, highly concerning  for metastatic disease. Largest lesion measures up to 6.7 cm in hepatic segment VI.   Enhancing osseous lesions in the L2 vertebral body, both iliac bones, and in the sacrum, highly concerning for osseous metastases.   05/15/2021 Initial Diagnosis   Bone metastases ([name])   05/27/2021 PET scan   1. Widespread recurrent/metastatic disease in this patient who is  status post bilateral mastectomy. Left chest wall recurrence with  left axillary/subpectoral, supraclavicular nodal metastasis.  2. Hepatic, left adrenal, and widespread osseous metastasis.  3. Incidental findings, including: Right nephrolithiasis. Tiny  hiatal hernia.    05/27/2021 Imaging   MRI LUMBAR AND THORACIC SPINE: Thoracic spine:  Osseous metastatic disease involving each level. The most dramatic deposits are at T4 and T6 where there is extraosseous/epidural tumor. No cord compression but there is foraminal impingement on the left at T6-7 and on the right at T3-4. Early dorsal epidural tumor at the level of T10 and T3.   Lumbar spine:  1. Widespread osseous metastatic disease with largest deposit  replacing the L2 body where there is a compression fracture with  mild height loss.  Also at this level is early epidural tumor  extension likely affecting both L2-3 foramina.  2. 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    05/27/2021 Imaging   MRI HEAD WITH AND WITHOUT CONTRAST: Negative for metastatic disease to the brain or calvarium.   06/03/2021 - 06/24/2021 Chemotherapy   Patient is on Treatment Plan : BREAST Trastuzumab  q21d X 11 Cycles     06/25/2021 - 01/01/2022 Chemotherapy   Patient is on Treatment Plan : BREAST Docetaxel  + Trastuzumab  + Pertuzumab  (THP) q21d     06/25/2021 - 01/02/2022 Chemotherapy   Patient is on Treatment Plan : BREAST Docetaxel  + Trastuzumab  + Pertuzumab  (THP) q21d     09/14/2021 Imaging   CT chest/abdomen/pelvis  IMPRESSION:  1. Interval resolution of previously seen bulky left axillary and  subpectoral lymphadenopathy. No persistently enlarged lymph nodes.  2. Multiple liver lesions are significantly diminished in size.  3. Widespread osseous metastatic disease, with an interval increase  in sclerosis of several previously lytic lesions.  4. Constellation of findings is consistent with treatment response  of metastatic disease  5. New, although age indeterminate pathologic wedge deformity of the  T6 vertebral body as well as an increased pathologic wedge deformity  of the L2 vertebral body.  6. Coronary artery disease.  Aortic Atherosclerosis (ICD10-I70.0).    01/26/2022 - 03/14/2024 Chemotherapy   Patient is on Treatment Plan : BREAST METASTATIC Fam-Trastuzumab Deruxtecan-nxki  (Enhertu ) (5.4) q21d     04/18/2024 -  Chemotherapy   Patient is on Treatment Plan : BREAST ADO-Trastuzumab Emtansine  (Kadcyla ) q21d       INTERVAL HISTORY:  Darbie is here today for repeat clinical assessment for her recurrent HER2 receptor positive breast cancer, previously treated with Taxane and Herceptin . When she progressed she was switched to Enhertu  and has done extremely well for 2 years. She had a CT chest, abdomen, and  pelvis done on 03/08/2024 that revealed a significant interval enlargement of a left adrenal mass consistent with worsened adrenal metastatic disease and a newly enlarged left retroperitoneal lymph node consistent with nodal metastatic disease. She has unchanged widespread sclerotic osseous metastatic disease throughout the axial and proximal appendicular skeleton and unchanged pathologic wedge deformities of T6 and L2. The liver appears clear now. Patient states that she feels understandably anxious since finding out about her latest CT results. 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 She had a Guardant 360 drawn on 03/14/2024. 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 I explained to her and her son these findings in detail and that this does have a lot of toxicities, so this will not be our first choice. I reminded her that she was previously on neoadjuvant Kadcyla  which she tolerated and had benefit. I reassured her that I believe she will be able to tolerate this treatment. We discussed possible side effects such as fatigue, nausea, vomiting, and diarrhea. She will have her chemoeducation today and we will plan to start treatment on December, 10th.If she has significant toxicities, we can always stop the treatment.  She has a WBC of 4.1, low hemoglobin of 11.8 down from 12.6, and platelet count of 162,000. Her CMP is normal other than an chronic elevated AST of 47. She received her Xgeva injection today and will be due for her next one on December 31st. She will then get it every 6 weeks as usual. I will see her back on December 31st with CBC and CMP for her second cycle of the Kadcyla . She denies fever, chills, night sweats, or other signs of  infection. She denies cardiorespiratory and gastrointestinal issues. She  denies pain. Her appetite is fairly good and Her weight has been stable. This patient is accompanied in the office by her son.   REVIEW OF SYSTEMS:  Review of Systems  Constitutional:  Negative for appetite change, chills, fatigue, fever and unexpected weight change.  HENT:  Negative.  Negative for lump/mass, mouth sores and sore throat.   Eyes:  Positive for eye problems (excessive tearing).  Respiratory: Negative.  Negative for chest tightness, cough, hemoptysis, shortness of breath and wheezing.   Cardiovascular: Negative.  Negative for chest pain, leg swelling and palpitations.  Gastrointestinal: Negative.  Negative for abdominal distention, abdominal pain, blood in stool, constipation, diarrhea, nausea and vomiting.  Endocrine: Negative.  Negative for hot flashes.  Genitourinary: Negative.  Negative for difficulty urinating, dysuria, frequency and hematuria.   Musculoskeletal:  Positive for arthralgias and gait problem (uses a cane, occasionally). Negative for back pain, flank pain and myalgias.  Skin: Negative.  Negative for rash.  Neurological:  Positive for extremity weakness (atrophy and severe left hand weakness) and gait problem (uses a cane, occasionally). Negative for dizziness, headaches, light-headedness, numbness, seizures and speech difficulty.  Hematological: Negative.  Negative for adenopathy. Does not bruise/bleed easily.  Psychiatric/Behavioral: Negative.  Negative for depression and sleep disturbance. The patient is not nervous/anxious.     VITALS:  Blood pressure (!) 159/54, pulse 66, temperature 97.8 F (36.6 C), temperature source Oral, resp. rate 18, height 5' 0.25 (1.53 m), weight 106 lb 8 oz (48.3 kg), SpO2 98%.  Wt Readings from Last 3 Encounters:  04/04/24 106 lb 8 oz (48.3 kg)  03/14/24 106 lb 12.8 oz (48.4 kg)  02/22/24 105 lb 9.6 oz (47.9 kg)    Body mass index is 20.63  kg/m.  Performance status (ECOG): 1 - Symptomatic but completely ambulatory  PHYSICAL EXAM:   Physical Exam Vitals and nursing note reviewed. Exam conducted with a chaperone present.  Constitutional:      General: She is not in acute distress.    Appearance: Normal appearance. She is normal  weight. She is not ill-appearing, toxic-appearing or diaphoretic.  HENT:     Head: Normocephalic and atraumatic.     Right Ear: Tympanic membrane, ear canal and external ear normal. There is no impacted cerumen.     Left Ear: Tympanic membrane, ear canal and external ear normal. There is no impacted cerumen.     Nose: Nose normal. No congestion or rhinorrhea.     Mouth/Throat:     Mouth: Mucous membranes are moist.     Pharynx: Oropharynx is clear. No oropharyngeal exudate or posterior oropharyngeal erythema.  Eyes:     General: No scleral icterus.       Right eye: No discharge.        Left eye: No discharge.     Extraocular Movements: Extraocular movements intact.     Conjunctiva/sclera: Conjunctivae normal.     Pupils: Pupils are equal, round, and reactive to light.     Comments: Excessive tearing  Neck:     Vascular: No carotid bruit.  Cardiovascular:     Rate and Rhythm: Normal rate and regular rhythm.     Pulses: Normal pulses.     Heart sounds: Normal heart sounds. No murmur heard.    No friction rub. No gallop.  Pulmonary:     Effort: Pulmonary effort is normal. No respiratory distress.     Breath sounds: Normal breath sounds. No stridor. No wheezing, rhonchi or rales.  Chest:     Chest wall: No tenderness.  Breasts:    Right: Normal.     Left: Normal.     Comments: Left chest wall is negative. Right breast is without masses Abdominal:     General: Bowel sounds are normal. There is no distension.     Palpations: Abdomen is soft. There is no hepatomegaly, splenomegaly or mass.     Tenderness: There is no abdominal tenderness. There is no right CVA tenderness, left CVA  tenderness, guarding or rebound.     Hernia: No hernia is present.  Musculoskeletal:        General: No swelling, tenderness, deformity or signs of injury. Normal range of motion.     Right hand: Normal.     Left hand: Decreased strength.     Cervical back: Normal range of motion and neck supple. No rigidity or tenderness.     Right lower leg: No edema.     Left lower leg: No edema.     Comments: Wasting of the left hand  Lymphadenopathy:     Cervical: No cervical adenopathy.     Right cervical: No superficial, deep or posterior cervical adenopathy.    Left cervical: No superficial, deep or posterior cervical adenopathy.     Upper Body:     Right upper body: No supraclavicular, axillary or pectoral adenopathy.     Left upper body: No supraclavicular, axillary or pectoral adenopathy.  Skin:    General: Skin is warm and dry.     Coloration: Skin is not jaundiced or pale.     Findings: No bruising, erythema, lesion or rash.  Neurological:     General: No focal deficit present.     Mental Status: She is alert and oriented to person, place, and time. Mental status is at baseline.     Cranial Nerves: No cranial nerve deficit.     Sensory: No sensory deficit.     Motor: No weakness.     Coordination: Coordination normal.     Gait: Gait normal.  Deep Tendon Reflexes: Reflexes normal.  Psychiatric:        Mood and Affect: Mood normal.        Behavior: Behavior normal.        Thought Content: Thought content normal.        Judgment: Judgment normal.    LABS:      Latest Ref Rng & Units 04/04/2024    1:05 PM 03/14/2024   12:59 PM 02/22/2024    1:14 PM  CBC  WBC 4.0 - 10.5 K/uL 4.1  5.3  5.3   Hemoglobin 12.0 - 15.0 g/dL 88.1  87.3  87.4   Hematocrit 36.0 - 46.0 % 35.3  36.4  36.4   Platelets 150 - 400 K/uL 162  163  213       Latest Ref Rng & Units 04/04/2024    1:05 PM 03/14/2024   12:59 PM 02/22/2024    1:14 PM  CMP  Glucose 70 - 99 mg/dL 97  90  898   BUN 8 - 23  mg/dL 20  18  25    Creatinine 0.44 - 1.00 mg/dL 9.05  9.25  9.15   Sodium 135 - 145 mmol/L 142  140  143   Potassium 3.5 - 5.1 mmol/L 4.5  4.7  4.5   Chloride 98 - 111 mmol/L 108  105  107   CO2 22 - 32 mmol/L 25  26  25    Calcium 8.9 - 10.3 mg/dL 9.8  89.7  89.1   Total Protein 6.5 - 8.1 g/dL 6.5  6.9  7.1   Total Bilirubin 0.0 - 1.2 mg/dL 0.5  0.5  0.4   Alkaline Phos 38 - 126 U/L 68  63  69   AST 15 - 41 U/L 47  45  46   ALT 0 - 44 U/L 24  22  20     Lab Results  Component Value Date   CEA1 1.8 06/03/2021   CEA 0.5 11/13/2012   CEA  Date Value Ref Range Status  06/03/2021 1.8 0.0 - 4.7 ng/mL Final    Comment:    (NOTE)                             Nonsmokers          <3.9                             Smokers             <5.6 Roche [employer] Electrochemiluminescence Immunoassay (ECLIA) Values obtained with different assay methods or kits cannot be used interchangeably.  Results cannot be interpreted as absolute evidence of the presence or absence of malignant disease. Performed At: [facility] [address] [place], [place] [id number] Jennette Shorter MD Ey:[id number]   11/13/2012 0.5 0.0 - 5.0 ng/mL Final   Lab Results  Component Value Date   LDH 186 11/12/2011   LDH 187 09/08/2010   LDH 171 09/09/2009   STUDIES:  EXAM: 03/08/2024 CT CHEST, ABDOMEN, AND PELVIS WITH CONTRAST IMPRESSION: 1. Significant interval enlargement of a left adrenal mass, consistent with worsened adrenal metastatic disease. 2. Newly enlarged left retroperitoneal lymph node, consistent with nodal metastatic disease. 3. Unchanged widespread sclerotic osseous metastatic disease throughout the axial and proximal appendicular skeleton. Unchanged pathologic wedge deformities of T6 and L2. 4. Left mastectomy and axillary lymph node dissection. 5. Coronary artery disease.  HISTORY:   Past Medical History:  Diagnosis Date   Arthritis    shoulder   Breast cancer ([name])    Cancer  ([name])    left breast ca   Colon cancer ([name])    [name], stage II 11/12/2011   Sigmoid lesion 5.9 cm  40 nodes negative but focus of cancer in a diverticum   Pre-op CEA 5.2 with lab normal up to 2.5 resected 09/21/05   Xeloda adjuvant chemotherapy   Hypertension    Polyneuropathy 02/03/2023   Right shoulder pain 04/12/2023    Past Surgical History:  Procedure Laterality Date   COLONOSCOPY     HEMICOLECTOMY  2007   MASTECTOMY WITH AXILLARY LYMPH NODE DISSECTION Left 03/14/2019   Procedure: LEFT MASTECTOMY WITH TARGETED LYMPH NODE DISSECTION;  Surgeon: Curvin Deward MOULD, MD;  Location: [facility] OR;  Service: General;  Laterality: Left;   SENTINEL NODE BIOPSY Left 03/14/2019   Procedure: Left Axillary Sentinel Lymph  Node Biopsy;  Surgeon: Curvin Deward MOULD, MD;  Location: [facility] OR;  Service: General;  Laterality: Left;   TONSILLECTOMY     WISDOM TOOTH EXTRACTION      Family History  Problem Relation Age of Onset   Cancer Maternal Aunt        colon cancer     Social History:  reports that she has never smoked. She has never used smokeless tobacco. She reports that she does not drink alcohol and does not use drugs.The patient is alone today.  Allergies: No Known Allergies  Current Medications: Current Outpatient Medications  Medication Sig Dispense Refill   Biotin 1 MG CAPS Take by mouth.     bisacodyl (DULCOLAX) 5 MG EC tablet Take 5 mg by mouth daily as needed for moderate constipation. 2 capsules every night     co-enzyme Q-10 30 MG capsule Take 100 mg by mouth daily.     fish oil-omega-3 fatty acids 1000 MG capsule Take 1 g by mouth daily.     MAGNESIUM PO Take 1 tablet by mouth every evening. 600 mg     Multiple Vitamin (MULTIVITAMIN) capsule Take 1 capsule by mouth daily.     neomycin-polymyxin b-dexamethasone  (MAXITROL) 3.5-10000-0.1 SUSP SMARTSIG:In Eye(s)     ondansetron  (ZOFRAN ) 4 MG tablet Take 1 tablet (4 mg total) by mouth every 8 (eight) hours as needed for nausea or  vomiting. (Patient not taking: Reported on 01/11/2024) 20 tablet 2   polyethylene glycol (MIRALAX / GLYCOLAX) 17 g packet Take 17 g by mouth as needed. constipation     triamcinolone  cream (KENALOG ) 0.1 % Apply 1 Application topically 2 (two) times daily. (Patient taking differently: Apply 1 Application topically 2 (two) times daily as needed.) 30 g 1   No current facility-administered medications for this visit.    I,Jasmine M Lassiter,acting as a scribe for [name], MD.,have documented all relevant documentation on the behalf of [name], MD,as directed by  [name], MD while in the presence of [name], MD.

## 2024-04-09 ENCOUNTER — Encounter: Payer: Self-pay | Admitting: Oncology

## 2024-04-10 ENCOUNTER — Encounter: Payer: Self-pay | Admitting: Oncology

## 2024-04-11 ENCOUNTER — Other Ambulatory Visit: Payer: Self-pay | Admitting: Oncology

## 2024-04-11 ENCOUNTER — Other Ambulatory Visit: Payer: Self-pay

## 2024-04-11 DIAGNOSIS — C787 Secondary malignant neoplasm of liver and intrahepatic bile duct: Secondary | ICD-10-CM

## 2024-04-11 DIAGNOSIS — Z171 Estrogen receptor negative status [ER-]: Secondary | ICD-10-CM

## 2024-04-11 DIAGNOSIS — C7951 Secondary malignant neoplasm of bone: Secondary | ICD-10-CM

## 2024-04-12 ENCOUNTER — Encounter: Payer: Self-pay | Admitting: Oncology

## 2024-04-13 ENCOUNTER — Encounter: Payer: Self-pay | Admitting: Oncology

## 2024-04-18 ENCOUNTER — Telehealth: Payer: Self-pay | Admitting: Dietician

## 2024-04-18 ENCOUNTER — Inpatient Hospital Stay: Attending: Hematology and Oncology | Admitting: Dietician

## 2024-04-18 ENCOUNTER — Encounter: Payer: Self-pay | Admitting: Oncology

## 2024-04-18 ENCOUNTER — Inpatient Hospital Stay: Attending: Hematology and Oncology

## 2024-04-18 ENCOUNTER — Inpatient Hospital Stay

## 2024-04-18 VITALS — BP 116/81 | HR 87 | Temp 97.6°F | Resp 18 | Ht 60.25 in | Wt 106.0 lb

## 2024-04-18 DIAGNOSIS — Z5112 Encounter for antineoplastic immunotherapy: Secondary | ICD-10-CM | POA: Diagnosis present

## 2024-04-18 DIAGNOSIS — C7951 Secondary malignant neoplasm of bone: Secondary | ICD-10-CM | POA: Insufficient documentation

## 2024-04-18 DIAGNOSIS — I972 Postmastectomy lymphedema syndrome: Secondary | ICD-10-CM | POA: Insufficient documentation

## 2024-04-18 DIAGNOSIS — Z79899 Other long term (current) drug therapy: Secondary | ICD-10-CM | POA: Insufficient documentation

## 2024-04-18 DIAGNOSIS — G629 Polyneuropathy, unspecified: Secondary | ICD-10-CM | POA: Insufficient documentation

## 2024-04-18 DIAGNOSIS — Z1722 Progesterone receptor negative status: Secondary | ICD-10-CM | POA: Diagnosis not present

## 2024-04-18 DIAGNOSIS — Z171 Estrogen receptor negative status [ER-]: Secondary | ICD-10-CM

## 2024-04-18 DIAGNOSIS — C787 Secondary malignant neoplasm of liver and intrahepatic bile duct: Secondary | ICD-10-CM | POA: Insufficient documentation

## 2024-04-18 DIAGNOSIS — Z85038 Personal history of other malignant neoplasm of large intestine: Secondary | ICD-10-CM | POA: Diagnosis not present

## 2024-04-18 DIAGNOSIS — M6250 Muscle wasting and atrophy, not elsewhere classified, unspecified site: Secondary | ICD-10-CM | POA: Insufficient documentation

## 2024-04-18 DIAGNOSIS — H04209 Unspecified epiphora, unspecified lacrimal gland: Secondary | ICD-10-CM | POA: Insufficient documentation

## 2024-04-18 DIAGNOSIS — Z800 Family history of malignant neoplasm of digestive organs: Secondary | ICD-10-CM | POA: Insufficient documentation

## 2024-04-18 DIAGNOSIS — C50412 Malignant neoplasm of upper-outer quadrant of left female breast: Secondary | ICD-10-CM | POA: Insufficient documentation

## 2024-04-18 DIAGNOSIS — Z9012 Acquired absence of left breast and nipple: Secondary | ICD-10-CM | POA: Insufficient documentation

## 2024-04-18 DIAGNOSIS — Z5111 Encounter for antineoplastic chemotherapy: Secondary | ICD-10-CM | POA: Insufficient documentation

## 2024-04-18 LAB — CBC WITH DIFFERENTIAL (CANCER CENTER ONLY)
Abs Immature Granulocytes: 0 K/uL (ref 0.00–0.07)
Basophils Absolute: 0 K/uL (ref 0.0–0.1)
Basophils Relative: 1 %
Eosinophils Absolute: 0.1 K/uL (ref 0.0–0.5)
Eosinophils Relative: 2 %
HCT: 35.9 % — ABNORMAL LOW (ref 36.0–46.0)
Hemoglobin: 12.3 g/dL (ref 12.0–15.0)
Immature Granulocytes: 0 %
Lymphocytes Relative: 30 %
Lymphs Abs: 1.5 K/uL (ref 0.7–4.0)
MCH: 35.4 pg — ABNORMAL HIGH (ref 26.0–34.0)
MCHC: 34.3 g/dL (ref 30.0–36.0)
MCV: 103.5 fL — ABNORMAL HIGH (ref 80.0–100.0)
Monocytes Absolute: 0.7 K/uL (ref 0.1–1.0)
Monocytes Relative: 14 %
Neutro Abs: 2.7 K/uL (ref 1.7–7.7)
Neutrophils Relative %: 53 %
Platelet Count: 137 K/uL — ABNORMAL LOW (ref 150–400)
RBC: 3.47 MIL/uL — ABNORMAL LOW (ref 3.87–5.11)
RDW: 13.2 % (ref 11.5–15.5)
WBC Count: 5.1 K/uL (ref 4.0–10.5)
nRBC: 0 % (ref 0.0–0.2)

## 2024-04-18 LAB — CMP (CANCER CENTER ONLY)
ALT: 21 U/L (ref 0–44)
AST: 47 U/L — ABNORMAL HIGH (ref 15–41)
Albumin: 4.1 g/dL (ref 3.5–5.0)
Alkaline Phosphatase: 63 U/L (ref 38–126)
Anion gap: 9 (ref 5–15)
BUN: 17 mg/dL (ref 8–23)
CO2: 24 mmol/L (ref 22–32)
Calcium: 9.9 mg/dL (ref 8.9–10.3)
Chloride: 106 mmol/L (ref 98–111)
Creatinine: 0.88 mg/dL (ref 0.44–1.00)
GFR, Estimated: >60 mL/min (ref >60)
Glucose, Bld: 108 mg/dL — ABNORMAL HIGH (ref 70–99)
Potassium: 4.1 mmol/L (ref 3.5–5.1)
Sodium: 139 mmol/L (ref 135–145)
Total Bilirubin: 0.6 mg/dL (ref 0.0–1.2)
Total Protein: 6.6 g/dL (ref 6.5–8.1)

## 2024-04-18 MED ORDER — PROCHLORPERAZINE MALEATE 10 MG PO TABS
10.0000 mg | ORAL_TABLET | Freq: Once | ORAL | Status: AC
  Administered 2024-04-18: 10 mg via ORAL
  Filled 2024-04-18: qty 1

## 2024-04-18 MED ORDER — SODIUM CHLORIDE 0.9 % IV SOLN
2.7000 mg/kg | Freq: Once | INTRAVENOUS | Status: AC
  Administered 2024-04-18: 140 mg via INTRAVENOUS
  Filled 2024-04-18: qty 7

## 2024-04-18 MED ORDER — SODIUM CHLORIDE 0.9 % IV SOLN: INTRAVENOUS | Status: DC

## 2024-04-18 MED ORDER — ONDANSETRON HCL 8 MG PO TABS: 8.0000 mg | ORAL_TABLET | Freq: Three times a day (TID) | ORAL | 1 refills | Status: AC | PRN

## 2024-04-18 MED ORDER — DIPHENHYDRAMINE HCL 25 MG PO CAPS
50.0000 mg | ORAL_CAPSULE | Freq: Once | ORAL | Status: AC
  Administered 2024-04-18: 50 mg via ORAL
  Filled 2024-04-18: qty 2

## 2024-04-18 MED ORDER — ACETAMINOPHEN 325 MG PO TABS
650.0000 mg | ORAL_TABLET | Freq: Once | ORAL | Status: AC
  Administered 2024-04-18: 650 mg via ORAL
  Filled 2024-04-18: qty 2

## 2024-04-18 MED ORDER — PROCHLORPERAZINE MALEATE 10 MG PO TABS: 10.0000 mg | ORAL_TABLET | Freq: Four times a day (QID) | ORAL | 1 refills | Status: AC | PRN

## 2024-04-18 NOTE — Progress Notes (Signed)
.  Pharmacist Chemotherapy Monitoring - Initial Assessment    Anticipated start date: 04/18/2024  [facility] every 6 months - last one 11/03/23  The following has been reviewed per standard work regarding the patient's treatment regimen: The patient's diagnosis, treatment plan and drug doses, and organ/hematologic function Lab orders and baseline tests specific to treatment regimen  The treatment plan start date, drug sequencing, and pre-medications Prior authorization status  Patient's documented medication list, including drug-drug interaction screen and prescriptions for anti-emetics and supportive care specific to the treatment regimen The drug concentrations, fluid compatibility, administration routes, and timing of the medications to be used The patient's access for treatment and lifetime cumulative dose history, if applicable  The patient's medication allergies and previous infusion related reactions, if applicable   Changes made to treatment plan:  N/A  Follow up needed:  N/A and Pending authorization for treatment    Devere Jasmin Lewis, [facility], 04/18/2024  11:50 AM

## 2024-04-18 NOTE — Patient Instructions (Signed)
 Ado-Trastuzumab Emtansine Injection What is this medication? ADO-TRASTUZUMAB EMTANSINE (ADD oh traz TOO zuh mab em TAN zine) treats breast cancer. It works by blocking a protein that causes cancer cells to grow and multiply. This helps to slow or stop the spread of cancer cells. This medicine may be used for other purposes; ask your health care provider or pharmacist if you have questions. COMMON BRAND NAME(S): Kadcyla What should I tell my care team before I take this medication? They need to know if you have any of these conditions: Heart failure Liver disease Low platelet levels Lung disease Tingling of the fingers or toes or other nerve disorder An unusual or allergic reaction to ado-trastuzumab emtansine, other medications, foods, dyes, or preservatives Pregnant or trying to get pregnant Breast-feeding How should I use this medication? This medication is infused into a vein. It is given by your care team in a hospital or clinic setting. Talk to your care team about the use of this medication in children. Special care may be needed. Overdosage: If you think you have taken too much of this medicine contact a poison control center or emergency room at once. NOTE: This medicine is only for you. Do not share this medicine with others. What if I miss a dose? Keep appointments for follow-up doses. It is important not to miss your dose. Call your care team if you are unable to keep an appointment. What may interact with this medication? Atazanavir Boceprevir Clarithromycin Dalfopristin; quinupristin Delavirdine Indinavir Isoniazid, INH Itraconazole Ketoconazole Nefazodone Nelfinavir Ritonavir Telaprevir Telithromycin Tipranavir Voriconazole This list may not describe all possible interactions. Give your health care provider a list of all the medicines, herbs, non-prescription drugs, or dietary supplements you use. Also tell them if you smoke, drink alcohol, or use illegal drugs.  Some items may interact with your medicine. What should I watch for while using this medication? This medication may make you feel generally unwell. This is not uncommon, as chemotherapy can affect healthy cells as well as cancer cells. Report any side effects. Continue your course of treatment even though you feel ill unless your care team tells you to stop. You may need blood work while taking this medication. This medication may increase your risk to bruise or bleed. Call your care team if you notice any unusual bleeding. Be careful brushing or flossing your teeth or using a toothpick because you may get an infection or bleed more easily. If you have any dental work done, tell your dentist you are receiving this medication. Talk to your care team if you may be pregnant. Serious birth defects can occur if you take this medication during pregnancy and for 7 months after the last dose. You will need a negative pregnancy test before starting this medication. Contraception is recommended while taking this medication and for 7 months after the last dose. Your care team can help you find the option that works for you. If your partner can get pregnant, use a condom during sex while taking this medication and for 4 months after the last dose. Do not breastfeed while taking this medication and for 7 months after the last dose. This medication may cause infertility. Talk to your care team if you are concerned with your fertility. What side effects may I notice from receiving this medication? Side effects that you should report to your care team as soon as possible: Allergic reactions--skin rash, itching, hives, swelling of the face, lips, tongue, or throat Bleeding--bloody or black, tar-like stools, vomiting  blood or brown material that looks like coffee grounds, red or dark brown urine, small red or purple spots on skin, unusual bruising or bleeding Dry cough, shortness of breath or trouble breathing Heart  failure--shortness of breath, swelling of the ankles, feet, or hands, sudden weight gain, unusual weakness or fatigue Infusion reactions--chest pain, shortness of breath or trouble breathing, feeling faint or lightheaded Liver injury--right upper belly pain, loss of appetite, nausea, light-colored stool, dark yellow or brown urine, yellowing skin or eyes, unusual weakness or fatigue Pain, tingling, or numbness in the hands or feet Painful swelling, warmth, or redness of the skin, blisters or sores at the infusion site Side effects that usually do not require medical attention (report to your care team if they continue or are bothersome): Constipation Fatigue Headache Muscle pain Nausea This list may not describe all possible side effects. Call your doctor for medical advice about side effects. You may report side effects to FDA at 1-800-FDA-1088. Where should I keep my medication? This medication is given in a hospital or clinic. It will not be stored at home. NOTE: This sheet is a summary. It may not cover all possible information. If you have questions about this medicine, talk to your doctor, pharmacist, or health care provider.  2024 Elsevier/Gold Standard (2021-09-11 00:00:00)

## 2024-04-18 NOTE — Telephone Encounter (Signed)
 Attempted to reach patient for a scheduled remote nutrition consult. Provided my cell# on voice mail to return call for the telephone nutrition consult.  Micheline Craven, RDN, LDN Registered Dietitian, [facility] Part Time Remote (Usual office hours: Tuesday-Thursday) Cell: [phone]

## 2024-04-18 NOTE — Progress Notes (Signed)
 Nutrition Assessment  Infusion RN provided # to reach [phone]   Reason for Assessment: New treatment and constipation   ASSESSMENT: Patient is 89 year old female with breast cancer who has been treated over many years and followed by Dr. Cornelius.  She is starting new treatment of Kadcyla  today.  Patient declined need for any nutrition advice.  She once again relayed she had education in nutrition and eats healthy.  When I suggested I could offer suggestions for constipation she said she's been regular and just like her mom she uses laxatives to keep her regular.  We talked briefly about trying to increase hot liquids to help.  She said she has tried some green tea and that and bone broth do seem to help.   Anthropometrics: no significant changes past 4 months  Height: 60 Weight: 106# UBW: 106-110# BMI: 20.53  Relayed that nutrition services are wrap around service provided at no charge and encouraged continued communication if experiencing continued weight loss or any nutritional impact symptoms (NIS).  Encouraged hot liquids and dried fruit, especially prunes for constipation.  Contact information provided on her cell [phone].   MONITORING, EVALUATION, GOAL: weight, PO intake, Nutrition Impact Symptoms, labs Goal is weight maintenance  Next Visit: PRN at patient or provider request  Micheline Craven, RDN, LDN Registered Dietitian, [facility] Part Time Remote (Usual office hours: Tuesday-Thursday) Cell: [phone]

## 2024-04-19 ENCOUNTER — Telehealth: Payer: Self-pay

## 2024-04-19 NOTE — Telephone Encounter (Signed)
 Attempted call to see how pt is feeling this afternoon, no answer. She had first Kadcyla  infusion yesterday.

## 2024-04-23 ENCOUNTER — Other Ambulatory Visit: Payer: Self-pay

## 2024-04-25 ENCOUNTER — Inpatient Hospital Stay: Admitting: Oncology

## 2024-04-25 ENCOUNTER — Inpatient Hospital Stay

## 2024-04-26 ENCOUNTER — Telehealth: Payer: Self-pay

## 2024-04-26 NOTE — Telephone Encounter (Signed)
 Pt reports that her left hand (dominant, writing hand)hurts so bad she can hardly stand it. They tell her it is arthritis but she isn't so sure. Anyway, she wanted to ask if she could take tylenol  for pain? She didn't want to mess up anything with the medication she started. She feels like everything with the new treatment is going well. No nausea. She has a little constipation, but took some MOM today. If no BM by tonight, she will take dose of Miralax.  I told her she could take tylenol .

## 2024-05-09 ENCOUNTER — Inpatient Hospital Stay

## 2024-05-09 ENCOUNTER — Inpatient Hospital Stay (HOSPITAL_BASED_OUTPATIENT_CLINIC_OR_DEPARTMENT_OTHER): Admitting: Hematology and Oncology

## 2024-05-09 VITALS — BP 149/67 | HR 70 | Temp 98.0°F | Resp 16

## 2024-05-09 VITALS — BP 142/68 | HR 73 | Temp 98.2°F | Resp 14 | Ht 60.25 in | Wt 104.4 lb

## 2024-05-09 DIAGNOSIS — Z5112 Encounter for antineoplastic immunotherapy: Secondary | ICD-10-CM | POA: Diagnosis not present

## 2024-05-09 DIAGNOSIS — C787 Secondary malignant neoplasm of liver and intrahepatic bile duct: Secondary | ICD-10-CM

## 2024-05-09 DIAGNOSIS — C50412 Malignant neoplasm of upper-outer quadrant of left female breast: Secondary | ICD-10-CM | POA: Diagnosis not present

## 2024-05-09 DIAGNOSIS — C7951 Secondary malignant neoplasm of bone: Secondary | ICD-10-CM

## 2024-05-09 DIAGNOSIS — Z171 Estrogen receptor negative status [ER-]: Secondary | ICD-10-CM

## 2024-05-09 LAB — CMP (CANCER CENTER ONLY)
ALT: 30 U/L (ref 0–44)
AST: 59 U/L — ABNORMAL HIGH (ref 15–41)
Albumin: 4.3 g/dL (ref 3.5–5.0)
Alkaline Phosphatase: 65 U/L (ref 38–126)
Anion gap: 9 (ref 5–15)
BUN: 30 mg/dL — ABNORMAL HIGH (ref 8–23)
CO2: 27 mmol/L (ref 22–32)
Calcium: 10.7 mg/dL — ABNORMAL HIGH (ref 8.9–10.3)
Chloride: 106 mmol/L (ref 98–111)
Creatinine: 0.81 mg/dL (ref 0.44–1.00)
GFR, Estimated: >60 mL/min
Glucose, Bld: 115 mg/dL — ABNORMAL HIGH (ref 70–99)
Potassium: 4 mmol/L (ref 3.5–5.1)
Sodium: 142 mmol/L (ref 135–145)
Total Bilirubin: 0.5 mg/dL (ref 0.0–1.2)
Total Protein: 6.6 g/dL (ref 6.5–8.1)

## 2024-05-09 LAB — CBC WITH DIFFERENTIAL (CANCER CENTER ONLY)
Abs Immature Granulocytes: 0.01 K/uL (ref 0.00–0.07)
Basophils Absolute: 0 K/uL (ref 0.0–0.1)
Basophils Relative: 1 %
Eosinophils Absolute: 0.1 K/uL (ref 0.0–0.5)
Eosinophils Relative: 2 %
HCT: 36.8 % (ref 36.0–46.0)
Hemoglobin: 12.4 g/dL (ref 12.0–15.0)
Immature Granulocytes: 0 %
Lymphocytes Relative: 35 %
Lymphs Abs: 1.4 K/uL (ref 0.7–4.0)
MCH: 35.3 pg — ABNORMAL HIGH (ref 26.0–34.0)
MCHC: 33.7 g/dL (ref 30.0–36.0)
MCV: 104.8 fL — ABNORMAL HIGH (ref 80.0–100.0)
Monocytes Absolute: 0.6 K/uL (ref 0.1–1.0)
Monocytes Relative: 14 %
Neutro Abs: 1.9 K/uL (ref 1.7–7.7)
Neutrophils Relative %: 48 %
Platelet Count: 125 K/uL — ABNORMAL LOW (ref 150–400)
RBC: 3.51 MIL/uL — ABNORMAL LOW (ref 3.87–5.11)
RDW: 12.8 % (ref 11.5–15.5)
WBC Count: 4 K/uL (ref 4.0–10.5)
nRBC: 0 % (ref 0.0–0.2)

## 2024-05-09 MED ORDER — ZOLEDRONIC ACID 4 MG/5ML IV CONC
3.0000 mg | Freq: Once | INTRAVENOUS | Status: AC
  Administered 2024-05-09: 3 mg via INTRAVENOUS
  Filled 2024-05-09: qty 3.75

## 2024-05-09 MED ORDER — SODIUM CHLORIDE 0.9% FLUSH: 10.0000 mL | INTRAVENOUS | Status: DC | PRN

## 2024-05-09 MED ORDER — SODIUM CHLORIDE 0.9 % IV SOLN: INTRAVENOUS | Status: DC

## 2024-05-09 MED ORDER — ACETAMINOPHEN 325 MG PO TABS: 650.0000 mg | ORAL_TABLET | Freq: Once | ORAL | Status: DC

## 2024-05-09 MED ORDER — DIPHENHYDRAMINE HCL 25 MG PO TABS
50.0000 mg | ORAL_TABLET | Freq: Once | ORAL | Status: DC
  Filled 2024-05-09: qty 2

## 2024-05-09 MED ORDER — SODIUM CHLORIDE 0.9 % IV SOLN
2.7000 mg/kg | Freq: Once | INTRAVENOUS | Status: AC
  Administered 2024-05-09: 140 mg via INTRAVENOUS
  Filled 2024-05-09: qty 7

## 2024-05-09 MED ORDER — PROCHLORPERAZINE MALEATE 10 MG PO TABS: 10.0000 mg | ORAL_TABLET | Freq: Once | ORAL | Status: DC

## 2024-05-09 NOTE — Progress Notes (Signed)
 " [facility]  [address] [place],  [place]  [address] [phone]  Clinic Day: 04/04/24  Referring physician: [name], MD  ASSESSMENT & PLAN:  Assessment: Malignant neoplasm of upper-outer quadrant of left breast in female, estrogen receptor negative ([name]) History of stage IIB (T2c N1 M0) HER2 receptor positive, ER/PR negative, left breast cancer diagnosed in June 2020.  She was treated with neoadjuvant Kadcyla /Perjeta  followed by left mastectomy.  She had a complete response in breast and nodes. She had only received two doses of adjuvant trastuzumab  before discontinuing in December 2020. She developed recurrent disease in 2022.   Malignant neoplasm metastatic to liver [name]) Numerous heterogeneously enhancing, internally necrotic lesions in the right hepatic lobe CT in December 2023, which was consistent with metastatic disease. Largest lesion measures up to 6.7 cm and there are at least 6 lesions.  Liver biopsy was consistent with metastatic breast cancer with negative estrogen and progesterone receptors and positive HER2 receptor. She was initially treated with trastuzumab  with an excellent response with significant decrease in the size of her liver lesions and sustained response. She later had progression of this disease in the chest wall and was switched to Enhertu .  CT chest, abdomen, and pelvis in March revealed stable subtle liver lesions with at least 1 lesion is no longer visualized. CT chest, abdomen and pelvis in October, 2024 revealed a decrease in the hepatic lesions with no new lesions seen.  CT imaging in April was fairly stable except a slight increase in the left adrenal nodule. Current scanning now shows that nodule to be dramatically enlarged to 3.1cm in addition to a new retroperitoneal node measuring 1.8cm. I reviewed these results with her in detail and recommend a change in therapy but we were awaiting the results of the Guardant testing. 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 The previous liver metastases are no longer visible.    Malignant neoplasm metastatic to bone [name]) Bone metastases diagnosed in December, 2022, for which she was placed on zoledronic  acid every 4 weeks. She is now receiving Zometa  every 6 weeks to correspond with her chemotherapy. CT imaging in April, 2025 did not reveal any evidence of progressive disease. He bone lesions appear stable.   Chest wall recurrence of breast cancer, left ([name]) Left chest wall recurrence diagnosed in January 2023.  Biopsy revealed carcinoma consistent with her previous breast cancer with negative estrogen and progesterone receptors and positive HER2 receptor.  She was initially treated with docetaxel /trastuzumab  and received 10 cycles with a good response.  She then had recurrence in the left chest wall in September 2023, so was switched to Enhertu  with good response.  There is no recurrence in the left chest wall at this time.    Polyneuropathy She has had chronic left hand pain, initially thought to be related to lymphedema and neuropathy.  Her lymphedema was controlled. She has decreased strength and muscle wasting.  She was referred to neurology for further evaluation.  EMG revealed polyneuropathy.  She has now seen orthopedics and had an MRI of the left brachial plexus that was unremarkable, but limited due to patient motion.  She had been on gabapentin  100 mg at bedtime, but discontinued that on her own. She saw Dr. [name] in neurology in October 2024 and underwent repeat EMG, which was more abnormal. She has been seen by Dr. [name] with [name] in [place], who did not feel surgery was a good option. The patient no longer sees occupational therapy,  but does exercises at home.  Epiphora She is very frustrated by this. She informed me that she was told about a procedure where they can place stents (pyrex tubes) in her eyes tear ducts to help with her excessive tearing. She has made an  appointment with her ophthalmologist, Dr. Hughie Bail in [place].  I encouraged her to pursue this as we know certain agents such as docetaxel  can cause pannicular stenosis of the tear ducts.  This has improved.   Plan: CT chest, abdomen, and pelvis done on 03/08/2024 revealed a significant interval enlargement of a left adrenal mass consistent with worsened adrenal metastatic disease and a newly enlarged left retroperitoneal lymph node consistent with nodal metastatic disease. She has unchanged widespread sclerotic osseous metastatic disease throughout the axial and proximal appendicular skeleton and unchanged pathologic wedge deformities of T6 and L2. The liver appears clear. She had a Guardant 360 drawn on 03/14/2024. 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 I explained to her and her son these findings in detail and that this does have a lot of toxicities, so this will not be our first choice. She tolerated her first dose of Kadcyla  well with only complaint being pain in her left hand. This has been ongoing and is not related to her treatment. We will proceed with cycle 2 today. She will undergo echocardiogram prior to her next cycle of treatment and will undergo CT imaging after cycle 4. She will return to clinic in 3 weeks for repeat evaluation with Dr. Cornelius. The patient understands the plans discussed today and is in agreement with them.  She knows to contact our office if she develops concerns prior to her next visit.   I provided 30 minutes of face-to-face time during this encounter and > 50% was spent counseling as documented under my assessment and plan.   [name], FNP- [name] - A DEPT OF [facility] [address] [place] [address] Dept: [phone] Dept Fax: [phone]   [name].  CHIEF COMPLAINT:  CC: Recurrent HER2 receptor positive breast cancer  Current Treatment: Enhertu  every 3 weeks  HISTORY OF PRESENT ILLNESS:  Jasmin Lewis is a 89 year old woman with recurrent HER2 receptor positive. She was diagnosed with stage IIB (T2 N1 M0) HER2 receptor positive and hormone receptor negative left breast cancer in June 2020 when she was able to palpate a mass of the left breast. Mammogram and ultrasound in May 2020 revealed a 2.9 cm irregular mass in the upper outer left breast as well as two adjacent abnormal left axillary lymph nodes. Biopsy was obtained on June 3rd and revealed invasive ductal carcinoma and DCIS with lymphovascular invasion. Left axillary lymph node was positive for invasive ductal carcinoma. HER2 was positive 3+. Estrogen and progesterone receptors were both negative at 0%, and Ki67 was 70%. Breast MRI from July revealed a 3.3 cm mass of the lateral portion of the left breast as well as significant non mass enhancement surrounding this mass and extending anteriorly into the nipple base, with largest diameter in the anterior to posterior axis, measuring 7.2 centimeters. Three enlarged left axillary lymph nodes. Right breast was negative.  PET imaging in July  revealed no evidence of additional metastatic involvement. She received neoadjuvant trastuzumab /pertuzumab  for 6 cycles. Repeat breast MRI in October revealed complete resolution of the enhancing mass and associated non mass enhancement within the left breast. Previously identified left axillary lymphadenopathy was no longer identified. She underwent left mastectomy and targeted lymph node dissection in November 2020.  Pathology revealed evidence of malignancy within the breast and three sentinel lymph nodes were negative for malignancy (0/3). She  continued maintenance trastuzumab  injections for two more doses, but this was discontinued in December 2020 as she refused further ECHO scans.    Of note, she also has a history of stage II colon cancer, diagnosed in May 2007. Preop CEA was 5.2. She underwent surgical resection and surgical pathology revealed a 5.9 x 3.8 x 1.2 cm moderate to well-differentiated adenocarcinoma penetrating the muscular wall. Forty lymph nodes negative. No vascular or lymphatic invasion. Multiple diverticula with microabscess formation and inflammation. Carcinoma present in at least 1 diverticulum. She received adjuvant chemotherapy with capecitabine for 9 months. Colonoscopy from August 2013 revealed 2 polyps which were resected and chronic diverticulosis.  In December 2022, she was evaluated at [facility] ED for abdominal and back pain.  CT abdomen pelvis revealed new lytic lesion involving the L2 vertebral body concerning for metastatic disease, as well as multiple hepatic rim-enhancing fluid collections with significant surrounding edema and an additional subtle 1.5 cm hypodensity in the hepatic dome. Given clinical history and presence of a new L2 lesion, leading differential consideration is metastatic disease. MRI abdomen in revealed numerous heterogeneously enhancing, internally necrotic lesions in the right hepatic lobe, highly concerning for metastatic disease. Largest lesion measures up to 6.7 cm in hepatic segment VI. Enhancing osseous lesions in the L2 vertebral body, both iliac bones, and in the sacrum, highly concerning for osseous metastases was also seen.    PET scan revealed widespread hypermetabolic disease in the left chest wall, left axillary, subpectoral and supraclavicular nodes, liver, left adrenal and bone.  MRI thoracic/lumbar spine revealed widespread osseous metastasis with the largest deposit replacing L2 vertebral body where there was a compression fracture with mild height loss and early epidural  tumor extension affecting L2-3 foramina.  MRI brain was negative for metastatic disease.  Biopsy of of the left chest wall  was consistent with recurrent ductal carcinoma of the breast.  HER2 was positive, estrogen and progesterone receptors were negative.  She was initially treated with trastuzumab , as well as zoledronic  acid, beginning in January 2023 with an initial response.   Oncology History Overview Note  Cancer Staging Malignant neoplasm of upper-outer quadrant of left breast in female, estrogen receptor negative ([name]) Staging form: Breast, AJCC 8th Edition - Clinical stage from 10/11/2018: Stage IIB (cT2, cN1, cM0, G3, ER-, PR-, HER2+) - Signed by Lanny Callander, MD on 11/02/2018 - Clinical: No stage assigned - Unsigned    History of colon cancer, stage II  09/2005 Initial Diagnosis   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 Findings were a 5.9 x 3.8 x 1.2 cm moderate to well-differentiated adenocarcinoma penetrating the muscular wall. Forty lymph nodes negative. No vascular or lymphatic invasion. Multiple diverticula with microabscess formation and inflammation. Carcinoma present in at least 1 diverticulum. Preop CEA 5.2 with lab normal range 0 to 2.5. Preop CT scan with no obvious additional pathology.    2007 -  Chemotherapy   She received oral Xeloda chemotherapy as  an adjuvant. She declined treatment on a clinical trial.    11/12/2011 Initial Diagnosis   [name], stage II   12/09/2011 Procedure   Followup colonoscopy done on 12/09/2011. She was found to have 2 polyps which were removed. Chronic diverticulosis.     Malignant neoplasm of upper-outer quadrant of left breast in female, estrogen receptor negative ([name])  09/26/2018 Mammogram   Mammogram/US  of left breast 09/26/18 IMPRESSION:  1. 2.9cm irregular mass in the upper outer left breast corresponds to the palpable abnormality.  This is highly suspicious for breast carcinoma.  2. Two adjacent abnormal left axillary  Lymph nodes suspicious for metastatic adenopathy. There is a third borderline abnormal left axillary LN with a cortex thickened to 4mm.  3. Benign right breast cyst. No evidence of right breast malignancy.    10/11/2018 Cancer Staging   Staging form: Breast, AJCC 8th Edition - Clinical stage from 10/11/2018: Stage IIB (cT2, cN1, cM0, G3, ER-, PR-, HER2+) - Signed by Lanny Callander, MD on 11/02/2018   10/11/2018 Initial Biopsy   Diagnosis 10/11/18 1. Breast, left, needle core biopsy, upper outer left 2 o'clock - INVASIVE DUCTAL CARCINOMA. - DUCTAL CARCINOMA IN SITU. - LYMPHOVASCULAR INVASION IS IDENTIFIED. - SEE COMMENT. 2. Lymph node, needle/core biopsy, left axilla - INVASIVE DUCTAL CARCINOMA. - SEE COMMENT.   10/11/2018 Receptors her2   The tumor cells are POSITIVE for Her2 (3+). Estrogen Receptor: 0%, NEGATIVE Progesterone Receptor: 0%, NEGATIVE Proliferation Marker Ki67: 70%   11/02/2018 Initial Diagnosis   Malignant neoplasm of upper-outer quadrant of left breast in female, estrogen receptor negative ([name])   11/15/2018 Breast MRI   MRI breast 11/15/18  IMPRESSION: 1. 3.3 centimeter mass in the LATERAL portion of the LEFT breast consistent with known malignancy. 2. There is significant non mass enhancement surrounding this mass and extending anteriorly into the nipple base, with largest diameter in the anterior to posterior axis, measuring 7.2 centimeters. 3. If the patient would consider breast conservation, additional MR guided core biopsies are recommended. Consider biopsy of the inferior and anterior extent of the non mass enhancement to document extent of disease. 4. Three enlarged LEFT axillary lymph nodes. 5. RIGHT breast is negative.   11/15/2018 PET scan   PET 11/15/18 IMPRESSION: Hypermetabolic left breast lesion compatible with known primary. Hypermetabolic left axillary lymph nodes are  consistent with metastatic disease.   No evidence for additional hypermetabolic metastatic involvement in the neck, chest, abdomen, or pelvis.   11/17/2018 - 03/01/2019 Chemotherapy   Neo-adjuvant Kadcyla  and perjeta  q3weeks for 6 cycles starting 11/17/18. Stopped before surgery    11/29/2018 Pathology Results   Diagnosis 1. Breast, left, needle core biopsy, inferior anterior (cylinder clip) - INVASIVE DUCTAL CARCINOMA. - DUCTAL CARCINOMA IN SITU. - LYMPHOVASCULAR INVASION IS IDENTIFIED. - SEE COMMENT. 2. Breast, left, needle core biopsy, central posterior (barbell clip) - INVASIVE DUCTAL CARCINOMA. - LYMPHOVASCULAR INVASION IS IDENTIFIED.   02/12/2019 Breast MRI   IMPRESSION: 1. Complete resolution of previously identified enhancing mass and associated non mass enhancement within the left breast. This is consistent with excellent response to chemotherapy. No residual or suspicious findings are identified. 2. No MRI evidence of malignancy on the right. 3. Previously identified left axillary lymphadenopathy not definitively seen on today's study. However, this may be due to decreased field-of-view compared to prior study.     03/14/2019 Cancer Staging   Staging form: Breast, AJCC 8th Edition - Pathologic stage from 03/14/2019: pT0, pN0, cM0, GX, ER: Unknown, PR: Unknown, HER2: Not  Assessed - Signed by Lanny Callander, MD on 03/28/2019   03/14/2019 Surgery   LEFT MASTECTOMY WITH TARGETED LYMPH NODE DISSECTION and Left Axillary Sentinel Lymph  Node Biopsy by Dr Curvin 03/14/19    03/14/2019 Pathology Results   FINAL MICROSCOPIC DIAGNOSIS:   A. LYMPH NODE, LEFT, SENTINEL, BIOPSY:  - There is no evidence of carcinoma in 1 of 1 lymph node (0/1).   B. BREAST, LEFT, MASTECTOMY:  - Benign breast parenchyma with treatment-related changes.  - There is no evidence of malignancy.  - See oncology table below.   C. LYMPH NODE, LEFT #1, SENTINEL, BIOPSY:  - There is no evidence of carcinoma in 1  of 1 lymph node (0/1).   D. LYMPH NODE, LEFT #2, SENTINEL, BIOPSY:  - There is no evidence of carcinoma in 1 of 1 lymph node (0/1).     04/04/2019 - 04/25/2019 Chemotherapy   Maintenance Herceptin  injections every 3 weeks starting 04/03/19 to complete 1 year of treatment that was started in 11/2018. Stopped after 2nd dose as she will not be repeating Echos.    06/03/2021 - 06/24/2021 Chemotherapy   Patient is on Treatment Plan : BREAST Trastuzumab  q21d X 11 Cycles     06/25/2021 - 01/01/2022 Chemotherapy   Patient is on Treatment Plan : BREAST Docetaxel  + Trastuzumab  + Pertuzumab  (THP) q21d     06/25/2021 - 01/02/2022 Chemotherapy   Patient is on Treatment Plan : BREAST Docetaxel  + Trastuzumab  + Pertuzumab  (THP) q21d     09/14/2021 Imaging   CT chest/abdomen/pelvis  IMPRESSION:  1. Interval resolution of previously seen bulky left axillary and  subpectoral lymphadenopathy. No persistently enlarged lymph nodes.  2. Multiple liver lesions are significantly diminished in size.  3. Widespread osseous metastatic disease, with an interval increase  in sclerosis of several previously lytic lesions.  4. Constellation of findings is consistent with treatment response  of metastatic disease  5. New, although age indeterminate pathologic wedge deformity of the  T6 vertebral body as well as an increased pathologic wedge deformity  of the L2 vertebral body.  6. Coronary artery disease.  Aortic Atherosclerosis (ICD10-I70.0).    01/26/2022 - 03/14/2024 Chemotherapy   Patient is on Treatment Plan : BREAST METASTATIC Fam-Trastuzumab Deruxtecan-nxki  (Enhertu ) (5.4) q21d     04/18/2024 -  Chemotherapy   Patient is on Treatment Plan : BREAST ADO-Trastuzumab Emtansine  (Kadcyla ) q21d     Malignant neoplasm metastatic to liver ([name])  04/17/2021 Imaging   CT ABDOMEN AND PELVIS WITH CONTRAST: -New lytic lesion involving the L2 vertebral body (6:72, 2:26) with minimal cortical breakthrough superiorly, but with  preservation of vertebral body height, concerning for metastatic disease.  -There are several indeterminate hepatic lesions as described above. In addition to the lesions described above, there is an additional subtle 1.5 cm hypodensity in the hepatic dome (2:7). Given clinical history and presence of a new L2 lesion, leading differential consideration is metastatic disease. Recommend further evaluation with MRI of the abdomen with and without contrast.   05/12/2021 Imaging   MRI ABDOMEN WITH AND WITHOUT CONTRAST: Numerous heterogeneously enhancing, internally necrotic lesions in the right hepatic lobe, highly concerning for metastatic disease. Largest lesion measures up to 6.7 cm in hepatic segment VI.   Enhancing osseous lesions in the L2 vertebral body, both iliac bones, and in the sacrum, highly concerning for osseous metastases.   05/15/2021 Initial Diagnosis   Liver metastases ([name])   05/27/2021 PET scan   1. Widespread recurrent/metastatic disease in this patient who  is  status post bilateral mastectomy. Left chest wall recurrence with  left axillary/subpectoral, supraclavicular nodal metastasis.  2. Hepatic, left adrenal, and widespread osseous metastasis.  3. Incidental findings, including: Right nephrolithiasis. Tiny  hiatal hernia.    05/27/2021 Imaging   MRI LUMBAR AND THORACIC SPINE: Thoracic spine:  Osseous metastatic disease involving each level. The most dramatic deposits are at T4 and T6 where there is extraosseous/epidural tumor. No cord compression but there is foraminal impingement on the left at T6-7 and on the right at T3-4. Early dorsal epidural tumor at the level of T10 and T3.   Lumbar spine:  1. Widespread osseous metastatic disease with largest deposit  replacing the L2 body where there is a compression fracture with  mild height loss. Also at this level is early epidural tumor  extension likely affecting both L2-3 foramina.  2. Lumbar spine degeneration with  scoliosis and multilevel  impingement.    05/27/2021 Imaging   MRI HEAD WITH AND WITHOUT CONTRAST: Negative for metastatic disease to the brain or calvarium.   06/03/2021 - 06/24/2021 Chemotherapy   Patient is on Treatment Plan : BREAST Trastuzumab  q21d X 11 Cycles     06/25/2021 - 01/01/2022 Chemotherapy   Patient is on Treatment Plan : BREAST Docetaxel  + Trastuzumab  + Pertuzumab  (THP) q21d     06/25/2021 - 01/02/2022 Chemotherapy   Patient is on Treatment Plan : BREAST Docetaxel  + Trastuzumab  + Pertuzumab  (THP) q21d     09/14/2021 Imaging   CT chest/abdomen/pelvis  IMPRESSION:  1. Interval resolution of previously seen bulky left axillary and  subpectoral lymphadenopathy. No persistently enlarged lymph nodes.  2. Multiple liver lesions are significantly diminished in size.  3. Widespread osseous metastatic disease, with an interval increase  in sclerosis of several previously lytic lesions.  4. Constellation of findings is consistent with treatment response  of metastatic disease  5. New, although age indeterminate pathologic wedge deformity of the  T6 vertebral body as well as an increased pathologic wedge deformity  of the L2 vertebral body.  6. Coronary artery disease.  Aortic Atherosclerosis (ICD10-I70.0).    01/26/2022 - 03/14/2024 Chemotherapy   Patient is on Treatment Plan : BREAST METASTATIC Fam-Trastuzumab Deruxtecan-nxki  (Enhertu ) (5.4) q21d     04/18/2024 -  Chemotherapy   Patient is on Treatment Plan : BREAST ADO-Trastuzumab Emtansine  (Kadcyla ) q21d     Malignant neoplasm metastatic to bone ([name])  04/17/2021 Imaging   CT ABDOMEN AND PELVIS WITH CONTRAST: -New lytic lesion involving the L2 vertebral body (6:72, 2:26) with minimal cortical breakthrough superiorly, but with preservation of vertebral body height, concerning for metastatic disease.  -There are several indeterminate hepatic lesions as described above. In addition to the lesions described above, there is an  additional subtle 1.5 cm hypodensity in the hepatic dome (2:7). Given clinical history and presence of a new L2 lesion, leading differential consideration is metastatic disease. Recommend further evaluation with MRI of the abdomen with and without contrast.   05/12/2021 Imaging   MRI ABDOMEN WITH AND WITHOUT CONTRAST: Numerous heterogeneously enhancing, internally necrotic lesions in the right hepatic lobe, highly concerning for metastatic disease. Largest lesion measures up to 6.7 cm in hepatic segment VI.   Enhancing osseous lesions in the L2 vertebral body, both iliac bones, and in the sacrum, highly concerning for osseous metastases.   05/15/2021 Initial Diagnosis   Bone metastases ([name])   05/27/2021 PET scan   1. Widespread recurrent/metastatic disease in this patient who is  status post bilateral mastectomy.  Left chest wall recurrence with  left axillary/subpectoral, supraclavicular nodal metastasis.  2. Hepatic, left adrenal, and widespread osseous metastasis.  3. Incidental findings, including: Right nephrolithiasis. Tiny  hiatal hernia.    05/27/2021 Imaging   MRI LUMBAR AND THORACIC SPINE: Thoracic spine:  Osseous metastatic disease involving each level. The most dramatic deposits are at T4 and T6 where there is extraosseous/epidural tumor. No cord compression but there is foraminal impingement on the left at T6-7 and on the right at T3-4. Early dorsal epidural tumor at the level of T10 and T3.   Lumbar spine:  1. Widespread osseous metastatic disease with largest deposit  replacing the L2 body where there is a compression fracture with  mild height loss. Also at this level is early epidural tumor  extension likely affecting both L2-3 foramina.  2. 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    05/27/2021 Imaging   MRI HEAD WITH AND WITHOUT CONTRAST: Negative for metastatic disease to the brain or calvarium.   06/03/2021 - 06/24/2021 Chemotherapy    Patient is on Treatment Plan : BREAST Trastuzumab  q21d X 11 Cycles     06/25/2021 - 01/01/2022 Chemotherapy   Patient is on Treatment Plan : BREAST Docetaxel  + Trastuzumab  + Pertuzumab  (THP) q21d     06/25/2021 - 01/02/2022 Chemotherapy   Patient is on Treatment Plan : BREAST Docetaxel  + Trastuzumab  + Pertuzumab  (THP) q21d     09/14/2021 Imaging   CT chest/abdomen/pelvis  IMPRESSION:  1. Interval resolution of previously seen bulky left axillary and  subpectoral lymphadenopathy. No persistently enlarged lymph nodes.  2. Multiple liver lesions are significantly diminished in size.  3. Widespread osseous metastatic disease, with an interval increase  in sclerosis of several previously lytic lesions.  4. Constellation of findings is consistent with treatment response  of metastatic disease  5. New, although age indeterminate pathologic wedge deformity of the  T6 vertebral body as well as an increased pathologic wedge deformity  of the L2 vertebral body.  6. Coronary artery disease.  Aortic Atherosclerosis (ICD10-I70.0).    01/26/2022 - 03/14/2024 Chemotherapy   Patient is on Treatment Plan : BREAST METASTATIC Fam-Trastuzumab Deruxtecan-nxki  (Enhertu ) (5.4) q21d     04/18/2024 -  Chemotherapy   Patient is on Treatment Plan : BREAST ADO-Trastuzumab Emtansine  (Kadcyla ) q21d       INTERVAL HISTORY:  Lilymarie is here today for repeat clinical assessment for her recurrent HER2 receptor positive breast cancer, previously treated with Taxane and Herceptin . When she progressed she was switched to Enhertu  and has done extremely well for 2 years. She had a CT chest, abdomen, and pelvis done on 03/08/2024 that revealed a significant interval enlargement of a left adrenal mass consistent with worsened adrenal metastatic disease and a newly enlarged left retroperitoneal lymph node consistent with nodal metastatic disease. She has unchanged widespread sclerotic osseous metastatic disease throughout the axial  and proximal appendicular skeleton and unchanged pathologic wedge deformities of T6 and L2. The liver appears clear now. Patient states that she feels understandably anxious since finding out about her latest CT results. 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 She had a Guardant 360 drawn on 03/14/2024. 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 I explained to her and her son these findings  in detail and that this does have a lot of toxicities, so this will not be our first choice. She tolerated her first cycle of Kadcyla  well.   She denies fever, chills, night sweats, or other signs of infection. She denies cardiorespiratory and gastrointestinal issues. She  denies pain. Her appetite is fairly good and Her weight has been stable. This patient is accompanied in the office by her son.   REVIEW OF SYSTEMS:  Review of Systems  Constitutional:  Negative for appetite change, chills, fatigue, fever and unexpected weight change.  HENT:  Negative.  Negative for lump/mass, mouth sores and sore throat.   Eyes:  Positive for eye problems (excessive tearing).  Respiratory: Negative.  Negative for chest tightness, cough, hemoptysis, shortness of breath and wheezing.   Cardiovascular: Negative.  Negative for chest pain, leg swelling and palpitations.  Gastrointestinal: Negative.  Negative for abdominal distention, abdominal pain, blood in stool, constipation, diarrhea, nausea and vomiting.  Endocrine: Negative.  Negative for hot flashes.  Genitourinary: Negative.  Negative for difficulty urinating, dysuria, frequency and hematuria.   Musculoskeletal:  Positive for arthralgias and gait problem (uses a cane, occasionally). Negative for back pain, flank pain  and myalgias.  Skin: Negative.  Negative for rash.  Neurological:  Positive for extremity weakness (atrophy and severe left hand weakness) and gait problem (uses a cane, occasionally). Negative for dizziness, headaches, light-headedness, numbness, seizures and speech difficulty.  Hematological: Negative.  Negative for adenopathy. Does not bruise/bleed easily.  Psychiatric/Behavioral: Negative.  Negative for depression and sleep disturbance. The patient is not nervous/anxious.     VITALS:  Blood pressure (!) 142/68, pulse 73, temperature 98.2 F (36.8 C), temperature source Oral, resp. rate 14, height 5' 0.25 (1.53 m), weight 104 lb 6.4 oz (47.4 kg), SpO2 94%.  Wt Readings from Last 3 Encounters:  05/09/24 104 lb 6.4 oz (47.4 kg)  04/18/24 106 lb (48.1 kg)  04/04/24 106 lb 8 oz (48.3 kg)    Body mass index is 20.22 kg/m.  Performance status (ECOG): 1 - Symptomatic but completely ambulatory  PHYSICAL EXAM:   Physical Exam Vitals and nursing note reviewed. Exam conducted with a chaperone present.  Constitutional:      General: She is not in acute distress.    Appearance: Normal appearance. She is normal weight. She is not ill-appearing, toxic-appearing or diaphoretic.  HENT:     Head: Normocephalic and atraumatic.     Right Ear: Tympanic membrane, ear canal and external ear normal. There is no impacted cerumen.     Left Ear: Tympanic membrane, ear canal and external ear normal. There is no impacted cerumen.     Nose: Nose normal. No congestion or rhinorrhea.     Mouth/Throat:     Mouth: Mucous membranes are moist.     Pharynx: Oropharynx is clear. No oropharyngeal exudate or posterior oropharyngeal erythema.  Eyes:     General: No scleral icterus.       Right eye: No discharge.        Left eye: No discharge.     Extraocular Movements: Extraocular movements intact.     Conjunctiva/sclera: Conjunctivae normal.     Pupils: Pupils are equal, round, and reactive to light.      Comments: Excessive tearing  Neck:     Vascular: No carotid bruit.  Cardiovascular:     Rate and Rhythm: Normal rate and regular rhythm.     Pulses: Normal pulses.     Heart sounds: Normal heart sounds. No  murmur heard.    No friction rub. No gallop.  Pulmonary:     Effort: Pulmonary effort is normal. No respiratory distress.     Breath sounds: Normal breath sounds. No stridor. No wheezing, rhonchi or rales.  Chest:     Chest wall: No tenderness.  Breasts:    Right: Normal.     Left: Normal.     Comments: Left chest wall is negative. Right breast is without masses Abdominal:     General: Bowel sounds are normal. There is no distension.     Palpations: Abdomen is soft. There is no hepatomegaly, splenomegaly or mass.     Tenderness: There is no abdominal tenderness. There is no right CVA tenderness, left CVA tenderness, guarding or rebound.     Hernia: No hernia is present.  Musculoskeletal:        General: No swelling, tenderness, deformity or signs of injury. Normal range of motion.     Right hand: Normal.     Left hand: Decreased strength.     Cervical back: Normal range of motion and neck supple. No rigidity or tenderness.     Right lower leg: No edema.     Left lower leg: No edema.     Comments: Wasting of the left hand  Lymphadenopathy:     Cervical: No cervical adenopathy.     Right cervical: No superficial, deep or posterior cervical adenopathy.    Left cervical: No superficial, deep or posterior cervical adenopathy.     Upper Body:     Right upper body: No supraclavicular, axillary or pectoral adenopathy.     Left upper body: No supraclavicular, axillary or pectoral adenopathy.  Skin:    General: Skin is warm and dry.     Coloration: Skin is not jaundiced or pale.     Findings: No bruising, erythema, lesion or rash.  Neurological:     General: No focal deficit present.     Mental Status: She is alert and oriented to person, place, and time. Mental status is at  baseline.     Cranial Nerves: No cranial nerve deficit.     Sensory: No sensory deficit.     Motor: No weakness.     Coordination: Coordination normal.     Gait: Gait normal.     Deep Tendon Reflexes: Reflexes normal.  Psychiatric:        Mood and Affect: Mood normal.        Behavior: Behavior normal.        Thought Content: Thought content normal.        Judgment: Judgment normal.    LABS:      Latest Ref Rng & Units 05/09/2024    8:33 AM 04/18/2024   11:07 AM 04/04/2024    1:05 PM  CBC  WBC 4.0 - 10.5 K/uL 4.0  5.1  4.1   Hemoglobin 12.0 - 15.0 g/dL 87.5  87.6  88.1   Hematocrit 36.0 - 46.0 % 36.8  35.9  35.3   Platelets 150 - 400 K/uL 125  137  162       Latest Ref Rng & Units 05/09/2024    8:33 AM 04/18/2024   11:07 AM 04/04/2024    1:05 PM  CMP  Glucose 70 - 99 mg/dL 884  891  97   BUN 8 - 23 mg/dL 30  17  20    Creatinine 0.44 - 1.00 mg/dL 9.18  9.11  9.05   Sodium 135 - 145 mmol/L 142  139  142   Potassium 3.5 - 5.1 mmol/L 4.0  4.1  4.5   Chloride 98 - 111 mmol/L 106  106  108   CO2 22 - 32 mmol/L 27  24  25    Calcium 8.9 - 10.3 mg/dL 89.2  9.9  9.8   Total Protein 6.5 - 8.1 g/dL 6.6  6.6  6.5   Total Bilirubin 0.0 - 1.2 mg/dL 0.5  0.6  0.5   Alkaline Phos 38 - 126 U/L 65  63  68   AST 15 - 41 U/L 59  47  47   ALT 0 - 44 U/L 30  21  24     Lab Results  Component Value Date   CEA1 1.8 06/03/2021   CEA 0.5 11/13/2012   CEA  Date Value Ref Range Status  06/03/2021 1.8 0.0 - 4.7 ng/mL Final    Comment:    (NOTE)                             Nonsmokers          <3.9                             Smokers             <5.6 [employer] Electrochemiluminescence Immunoassay (ECLIA) Values obtained with different assay methods or kits cannot be used interchangeably.  Results cannot be interpreted as absolute evidence of the presence or absence of malignant disease. Performed At: [facility] [address] [place], [place] [id number] Jennette Shorter MD Ey:[id number]   11/13/2012 0.5 0.0 - 5.0 ng/mL Final   Lab Results  Component Value Date   LDH 186 11/12/2011   LDH 187 09/08/2010   LDH 171 09/09/2009   STUDIES:  EXAM: 03/08/2024 CT CHEST, ABDOMEN, AND PELVIS WITH CONTRAST IMPRESSION: 1. Significant interval enlargement of a left adrenal mass, consistent with worsened adrenal metastatic disease. 2. Newly enlarged left retroperitoneal lymph node, consistent with nodal metastatic disease. 3. Unchanged widespread sclerotic osseous metastatic disease throughout the axial and proximal appendicular skeleton. Unchanged pathologic wedge deformities of T6 and L2. 4. Left mastectomy and axillary lymph node dissection. 5. Coronary artery disease.   HISTORY:   Past Medical History:  Diagnosis Date   Arthritis    shoulder   Breast cancer ([name])    Cancer ([name])    left breast ca   Colon cancer ([name])    [name], stage II 11/12/2011   Sigmoid lesion 5.9 cm  40 nodes negative but focus of cancer in a diverticum   Pre-op CEA 5.2 with lab normal up to 2.5 resected 09/21/05   Xeloda adjuvant chemotherapy   Hypertension    Polyneuropathy 02/03/2023   Right shoulder pain 04/12/2023    Past Surgical History:  Procedure Laterality Date   COLONOSCOPY     HEMICOLECTOMY  2007   MASTECTOMY WITH AXILLARY LYMPH NODE DISSECTION Left 03/14/2019   Procedure: LEFT MASTECTOMY WITH TARGETED LYMPH NODE DISSECTION;  Surgeon: Curvin Deward MOULD, MD;  Location: [facility] OR;  Service: General;  Laterality: Left;   SENTINEL NODE BIOPSY Left 03/14/2019   Procedure: Left Axillary Sentinel Lymph  Node Biopsy;  Surgeon: Curvin Deward MOULD, MD;  Location: [facility] OR;  Service: General;  Laterality: Left;   TONSILLECTOMY     WISDOM TOOTH EXTRACTION      Family History  Problem  Relation Age of Onset   Cancer Maternal Aunt        colon cancer     Social History:  reports that she has never smoked. She has never used smokeless tobacco. She reports that she  does not drink alcohol and does not use drugs.The patient is alone today.  Allergies: No Known Allergies  Current Medications: Current Outpatient Medications  Medication Sig Dispense Refill   Biotin 1 MG CAPS Take by mouth.     bisacodyl (DULCOLAX) 5 MG EC tablet Take 5 mg by mouth daily as needed for moderate constipation. 2 capsules every night     co-enzyme Q-10 30 MG capsule Take 100 mg by mouth daily.     fish oil-omega-3 fatty acids 1000 MG capsule Take 1 g by mouth daily.     MAGNESIUM PO Take 1 tablet by mouth every evening. 600 mg     Multiple Vitamin (MULTIVITAMIN) capsule Take 1 capsule by mouth daily.     neomycin-polymyxin b-dexamethasone  (MAXITROL) 3.5-10000-0.1 SUSP SMARTSIG:In Eye(s)     ondansetron  (ZOFRAN ) 4 MG tablet Take 1 tablet (4 mg total) by mouth every 8 (eight) hours as needed for nausea or vomiting. (Patient not taking: Reported on 01/11/2024) 20 tablet 2   ondansetron  (ZOFRAN ) 8 MG tablet Take 1 tablet (8 mg total) by mouth every 8 (eight) hours as needed for nausea or vomiting. 30 tablet 1   polyethylene glycol (MIRALAX / GLYCOLAX) 17 g packet Take 17 g by mouth as needed. constipation     prochlorperazine  (COMPAZINE ) 10 MG tablet Take 1 tablet (10 mg total) by mouth every 6 (six) hours as needed for nausea or vomiting. 30 tablet 1   triamcinolone  cream (KENALOG ) 0.1 % Apply 1 Application topically 2 (two) times daily. (Patient taking differently: Apply 1 Application topically 2 (two) times daily as needed.) 30 g 1   No current facility-administered medications for this visit.   Facility-Administered Medications Ordered in Other Visits  Medication Dose Route Frequency Provider Last Rate Last Admin   0.9 %  sodium chloride  infusion   Intravenous Continuous [name], MD 10 mL/hr at 05/09/24 1033 New Bag at 05/09/24 1033   acetaminophen  (TYLENOL ) tablet 650 mg  650 mg Oral Once [name], MD       ado-trastuzumab emtansine  (KADCYLA ) 140 mg in  sodium chloride  0.9 % 250 mL chemo infusion  2.7 mg/kg (Treatment Plan Recorded) Intravenous Once [name], MD       diphenhydrAMINE  (BENADRYL ) tablet 50 mg  50 mg Oral Once [name], MD       prochlorperazine  (COMPAZINE ) tablet 10 mg  10 mg Oral Once [name], MD       sodium chloride  flush (NS) 0.9 % injection 10 mL  10 mL Intracatheter PRN [name], MD       zoledronic  acid (ZOMETA ) 3 mg in sodium chloride  0.9 % 100 mL IVPB  3 mg Intravenous Once [name], MD 415 mL/hr at 05/09/24 1035 3 mg at 05/09/24 1035    "

## 2024-05-09 NOTE — Patient Instructions (Signed)
 Ado-Trastuzumab Emtansine Injection What is this medication? ADO-TRASTUZUMAB EMTANSINE (ADD oh traz TOO zuh mab em TAN zine) treats breast cancer. It works by blocking a protein that causes cancer cells to grow and multiply. This helps to slow or stop the spread of cancer cells. This medicine may be used for other purposes; ask your health care provider or pharmacist if you have questions. COMMON BRAND NAME(S): Kadcyla What should I tell my care team before I take this medication? They need to know if you have any of these conditions: Heart failure Liver disease Low platelet levels Lung disease Tingling of the fingers or toes or other nerve disorder An unusual or allergic reaction to ado-trastuzumab emtansine, other medications, foods, dyes, or preservatives Pregnant or trying to get pregnant Breast-feeding How should I use this medication? This medication is infused into a vein. It is given by your care team in a hospital or clinic setting. Talk to your care team about the use of this medication in children. Special care may be needed. Overdosage: If you think you have taken too much of this medicine contact a poison control center or emergency room at once. NOTE: This medicine is only for you. Do not share this medicine with others. What if I miss a dose? Keep appointments for follow-up doses. It is important not to miss your dose. Call your care team if you are unable to keep an appointment. What may interact with this medication? Atazanavir Boceprevir Clarithromycin Dalfopristin; quinupristin Delavirdine Indinavir Isoniazid, INH Itraconazole Ketoconazole Nefazodone Nelfinavir Ritonavir Telaprevir Telithromycin Tipranavir Voriconazole This list may not describe all possible interactions. Give your health care provider a list of all the medicines, herbs, non-prescription drugs, or dietary supplements you use. Also tell them if you smoke, drink alcohol, or use illegal drugs.  Some items may interact with your medicine. What should I watch for while using this medication? This medication may make you feel generally unwell. This is not uncommon, as chemotherapy can affect healthy cells as well as cancer cells. Report any side effects. Continue your course of treatment even though you feel ill unless your care team tells you to stop. You may need blood work while taking this medication. This medication may increase your risk to bruise or bleed. Call your care team if you notice any unusual bleeding. Be careful brushing or flossing your teeth or using a toothpick because you may get an infection or bleed more easily. If you have any dental work done, tell your dentist you are receiving this medication. Talk to your care team if you may be pregnant. Serious birth defects can occur if you take this medication during pregnancy and for 7 months after the last dose. You will need a negative pregnancy test before starting this medication. Contraception is recommended while taking this medication and for 7 months after the last dose. Your care team can help you find the option that works for you. If your partner can get pregnant, use a condom during sex while taking this medication and for 4 months after the last dose. Do not breastfeed while taking this medication and for 7 months after the last dose. This medication may cause infertility. Talk to your care team if you are concerned with your fertility. What side effects may I notice from receiving this medication? Side effects that you should report to your care team as soon as possible: Allergic reactions--skin rash, itching, hives, swelling of the face, lips, tongue, or throat Bleeding--bloody or black, tar-like stools, vomiting  blood or brown material that looks like coffee grounds, red or dark brown urine, small red or purple spots on skin, unusual bruising or bleeding Dry cough, shortness of breath or trouble breathing Heart  failure--shortness of breath, swelling of the ankles, feet, or hands, sudden weight gain, unusual weakness or fatigue Infusion reactions--chest pain, shortness of breath or trouble breathing, feeling faint or lightheaded Liver injury--right upper belly pain, loss of appetite, nausea, light-colored stool, dark yellow or brown urine, yellowing skin or eyes, unusual weakness or fatigue Pain, tingling, or numbness in the hands or feet Painful swelling, warmth, or redness of the skin, blisters or sores at the infusion site Side effects that usually do not require medical attention (report to your care team if they continue or are bothersome): Constipation Fatigue Headache Muscle pain Nausea This list may not describe all possible side effects. Call your doctor for medical advice about side effects. You may report side effects to FDA at 1-800-FDA-1088. Where should I keep my medication? This medication is given in a hospital or clinic. It will not be stored at home. NOTE: This sheet is a summary. It may not cover all possible information. If you have questions about this medicine, talk to your doctor, pharmacist, or health care provider.  2024 Elsevier/Gold Standard (2021-09-11 00:00:00)

## 2024-05-14 LAB — GUARDANT 360

## 2024-05-15 ENCOUNTER — Telehealth: Payer: Self-pay

## 2024-05-15 NOTE — Telephone Encounter (Signed)
 Patient called and voiced complaint of gas and burping since starting new treatment. She used gas x yesterday with relief and will use now for relief. She was wondering if you wanted her to continue with the gas X or add any other meds or suggestions?

## 2024-05-23 ENCOUNTER — Telehealth: Payer: Self-pay

## 2024-05-23 NOTE — Telephone Encounter (Signed)
 Patient called to confirm date and time of echocardiogram.  Sch for 06/05/2024 at 1130 at [facility] . Patient voiced understanding and location.

## 2024-05-24 ENCOUNTER — Other Ambulatory Visit: Payer: Self-pay

## 2024-05-30 ENCOUNTER — Inpatient Hospital Stay: Attending: Hematology and Oncology | Admitting: Oncology

## 2024-05-30 ENCOUNTER — Other Ambulatory Visit: Payer: Self-pay | Admitting: Oncology

## 2024-05-30 ENCOUNTER — Encounter: Payer: Self-pay | Admitting: Oncology

## 2024-05-30 ENCOUNTER — Inpatient Hospital Stay

## 2024-05-30 ENCOUNTER — Inpatient Hospital Stay: Attending: Hematology and Oncology

## 2024-05-30 VITALS — BP 149/89 | HR 75 | Temp 97.9°F | Resp 18 | Ht 60.25 in | Wt 102.4 lb

## 2024-05-30 VITALS — BP 133/74 | HR 74 | Resp 18

## 2024-05-30 DIAGNOSIS — Z79899 Other long term (current) drug therapy: Secondary | ICD-10-CM | POA: Diagnosis not present

## 2024-05-30 DIAGNOSIS — H04209 Unspecified epiphora, unspecified lacrimal gland: Secondary | ICD-10-CM | POA: Insufficient documentation

## 2024-05-30 DIAGNOSIS — M6250 Muscle wasting and atrophy, not elsewhere classified, unspecified site: Secondary | ICD-10-CM | POA: Insufficient documentation

## 2024-05-30 DIAGNOSIS — C787 Secondary malignant neoplasm of liver and intrahepatic bile duct: Secondary | ICD-10-CM | POA: Diagnosis not present

## 2024-05-30 DIAGNOSIS — Z5112 Encounter for antineoplastic immunotherapy: Secondary | ICD-10-CM | POA: Insufficient documentation

## 2024-05-30 DIAGNOSIS — C7951 Secondary malignant neoplasm of bone: Secondary | ICD-10-CM | POA: Diagnosis not present

## 2024-05-30 DIAGNOSIS — G629 Polyneuropathy, unspecified: Secondary | ICD-10-CM | POA: Insufficient documentation

## 2024-05-30 DIAGNOSIS — C50412 Malignant neoplasm of upper-outer quadrant of left female breast: Secondary | ICD-10-CM

## 2024-05-30 DIAGNOSIS — C50912 Malignant neoplasm of unspecified site of left female breast: Secondary | ICD-10-CM

## 2024-05-30 DIAGNOSIS — Z171 Estrogen receptor negative status [ER-]: Secondary | ICD-10-CM | POA: Insufficient documentation

## 2024-05-30 DIAGNOSIS — Z85038 Personal history of other malignant neoplasm of large intestine: Secondary | ICD-10-CM | POA: Insufficient documentation

## 2024-05-30 DIAGNOSIS — Z1722 Progesterone receptor negative status: Secondary | ICD-10-CM | POA: Diagnosis not present

## 2024-05-30 DIAGNOSIS — Z9012 Acquired absence of left breast and nipple: Secondary | ICD-10-CM | POA: Insufficient documentation

## 2024-05-30 DIAGNOSIS — I2510 Atherosclerotic heart disease of native coronary artery without angina pectoris: Secondary | ICD-10-CM | POA: Insufficient documentation

## 2024-05-30 DIAGNOSIS — Z800 Family history of malignant neoplasm of digestive organs: Secondary | ICD-10-CM | POA: Diagnosis not present

## 2024-05-30 DIAGNOSIS — I972 Postmastectomy lymphedema syndrome: Secondary | ICD-10-CM | POA: Diagnosis not present

## 2024-05-30 LAB — CMP (CANCER CENTER ONLY)
ALT: 28 U/L (ref 0–44)
AST: 60 U/L — ABNORMAL HIGH (ref 15–41)
Albumin: 4 g/dL (ref 3.5–5.0)
Alkaline Phosphatase: 77 U/L (ref 38–126)
Anion gap: 9 (ref 5–15)
BUN: 19 mg/dL (ref 8–23)
CO2: 27 mmol/L (ref 22–32)
Calcium: 10.9 mg/dL — ABNORMAL HIGH (ref 8.9–10.3)
Chloride: 104 mmol/L (ref 98–111)
Creatinine: 0.72 mg/dL (ref 0.44–1.00)
GFR, Estimated: >60 mL/min
Glucose, Bld: 96 mg/dL (ref 70–99)
Potassium: 4.2 mmol/L (ref 3.5–5.1)
Sodium: 140 mmol/L (ref 135–145)
Total Bilirubin: 0.6 mg/dL (ref 0.0–1.2)
Total Protein: 6.9 g/dL (ref 6.5–8.1)

## 2024-05-30 LAB — CBC WITH DIFFERENTIAL (CANCER CENTER ONLY)
Abs Immature Granulocytes: 0.01 K/uL (ref 0.00–0.07)
Basophils Absolute: 0 K/uL (ref 0.0–0.1)
Basophils Relative: 1 %
Eosinophils Absolute: 0.1 K/uL (ref 0.0–0.5)
Eosinophils Relative: 1 %
HCT: 38.2 % (ref 36.0–46.0)
Hemoglobin: 12.6 g/dL (ref 12.0–15.0)
Immature Granulocytes: 0 %
Lymphocytes Relative: 28 %
Lymphs Abs: 1.6 K/uL (ref 0.7–4.0)
MCH: 33.8 pg (ref 26.0–34.0)
MCHC: 33 g/dL (ref 30.0–36.0)
MCV: 102.4 fL — ABNORMAL HIGH (ref 80.0–100.0)
Monocytes Absolute: 0.6 K/uL (ref 0.1–1.0)
Monocytes Relative: 11 %
Neutro Abs: 3.3 K/uL (ref 1.7–7.7)
Neutrophils Relative %: 59 %
Platelet Count: 134 K/uL — ABNORMAL LOW (ref 150–400)
RBC: 3.73 MIL/uL — ABNORMAL LOW (ref 3.87–5.11)
RDW: 12.3 % (ref 11.5–15.5)
WBC Count: 5.6 K/uL (ref 4.0–10.5)
nRBC: 0 % (ref 0.0–0.2)

## 2024-05-30 MED ORDER — SODIUM CHLORIDE 0.9 % IV SOLN: INTRAVENOUS | Status: DC

## 2024-05-30 MED ORDER — PROCHLORPERAZINE MALEATE 10 MG PO TABS: 10.0000 mg | ORAL_TABLET | Freq: Once | ORAL | Status: DC

## 2024-05-30 MED ORDER — ACETAMINOPHEN 325 MG PO TABS: 650.0000 mg | ORAL_TABLET | Freq: Once | ORAL | Status: DC

## 2024-05-30 MED ORDER — DIPHENHYDRAMINE HCL 25 MG PO TABS: 50.0000 mg | ORAL_TABLET | Freq: Once | ORAL | Status: DC

## 2024-05-30 MED ORDER — SODIUM CHLORIDE 0.9 % IV SOLN
2.6000 mg/kg | Freq: Once | INTRAVENOUS | Status: AC
  Administered 2024-05-30: 120 mg via INTRAVENOUS
  Filled 2024-05-30: qty 6

## 2024-05-30 NOTE — Patient Instructions (Signed)
 Ado-Trastuzumab Emtansine Injection What is this medication? ADO-TRASTUZUMAB EMTANSINE (ADD oh traz TOO zuh mab em TAN zine) treats breast cancer. It works by blocking a protein that causes cancer cells to grow and multiply. This helps to slow or stop the spread of cancer cells. This medicine may be used for other purposes; ask your health care provider or pharmacist if you have questions. COMMON BRAND NAME(S): Kadcyla What should I tell my care team before I take this medication? They need to know if you have any of these conditions: Heart failure Liver disease Low platelet levels Lung disease Tingling of the fingers or toes or other nerve disorder An unusual or allergic reaction to ado-trastuzumab emtansine, other medications, foods, dyes, or preservatives Pregnant or trying to get pregnant Breast-feeding How should I use this medication? This medication is infused into a vein. It is given by your care team in a hospital or clinic setting. Talk to your care team about the use of this medication in children. Special care may be needed. Overdosage: If you think you have taken too much of this medicine contact a poison control center or emergency room at once. NOTE: This medicine is only for you. Do not share this medicine with others. What if I miss a dose? Keep appointments for follow-up doses. It is important not to miss your dose. Call your care team if you are unable to keep an appointment. What may interact with this medication? Atazanavir Boceprevir Clarithromycin Dalfopristin; quinupristin Delavirdine Indinavir Isoniazid, INH Itraconazole Ketoconazole Nefazodone Nelfinavir Ritonavir Telaprevir Telithromycin Tipranavir Voriconazole This list may not describe all possible interactions. Give your health care provider a list of all the medicines, herbs, non-prescription drugs, or dietary supplements you use. Also tell them if you smoke, drink alcohol, or use illegal drugs.  Some items may interact with your medicine. What should I watch for while using this medication? This medication may make you feel generally unwell. This is not uncommon, as chemotherapy can affect healthy cells as well as cancer cells. Report any side effects. Continue your course of treatment even though you feel ill unless your care team tells you to stop. You may need blood work while taking this medication. This medication may increase your risk to bruise or bleed. Call your care team if you notice any unusual bleeding. Be careful brushing or flossing your teeth or using a toothpick because you may get an infection or bleed more easily. If you have any dental work done, tell your dentist you are receiving this medication. Talk to your care team if you may be pregnant. Serious birth defects can occur if you take this medication during pregnancy and for 7 months after the last dose. You will need a negative pregnancy test before starting this medication. Contraception is recommended while taking this medication and for 7 months after the last dose. Your care team can help you find the option that works for you. If your partner can get pregnant, use a condom during sex while taking this medication and for 4 months after the last dose. Do not breastfeed while taking this medication and for 7 months after the last dose. This medication may cause infertility. Talk to your care team if you are concerned with your fertility. What side effects may I notice from receiving this medication? Side effects that you should report to your care team as soon as possible: Allergic reactions--skin rash, itching, hives, swelling of the face, lips, tongue, or throat Bleeding--bloody or black, tar-like stools, vomiting  blood or brown material that looks like coffee grounds, red or dark brown urine, small red or purple spots on skin, unusual bruising or bleeding Dry cough, shortness of breath or trouble breathing Heart  failure--shortness of breath, swelling of the ankles, feet, or hands, sudden weight gain, unusual weakness or fatigue Infusion reactions--chest pain, shortness of breath or trouble breathing, feeling faint or lightheaded Liver injury--right upper belly pain, loss of appetite, nausea, light-colored stool, dark yellow or brown urine, yellowing skin or eyes, unusual weakness or fatigue Pain, tingling, or numbness in the hands or feet Painful swelling, warmth, or redness of the skin, blisters or sores at the infusion site Side effects that usually do not require medical attention (report to your care team if they continue or are bothersome): Constipation Fatigue Headache Muscle pain Nausea This list may not describe all possible side effects. Call your doctor for medical advice about side effects. You may report side effects to FDA at 1-800-FDA-1088. Where should I keep my medication? This medication is given in a hospital or clinic. It will not be stored at home. NOTE: This sheet is a summary. It may not cover all possible information. If you have questions about this medicine, talk to your doctor, pharmacist, or health care provider.  2024 Elsevier/Gold Standard (2021-09-11 00:00:00)

## 2024-05-30 NOTE — Progress Notes (Signed)
 " [facility]  [address] [place],  [place]  [address] [phone]  Clinic Day:05/30/24  Referring physician: [name], MD  ASSESSMENT & PLAN:  Assessment: Malignant neoplasm of upper-outer quadrant of left breast in female, estrogen receptor negative ([name]) History of stage IIB (T2c N1 M0) HER2 receptor positive, ER/PR negative, left breast cancer diagnosed in June 2020.  She was treated with neoadjuvant Kadcyla /Perjeta  followed by left mastectomy.  She had a complete response in breast and nodes. She had only received two doses of adjuvant trastuzumab  before discontinuing in December 2020. She developed recurrent disease in 2022.   Malignant neoplasm metastatic to liver [facility]) Liver metastases discovered on 04/17/21. She had numerous heterogeneously enhancing, internally necrotic lesions in the right hepatic lobe. Largest lesion measures up to 6.7 cm and there are at least 6 lesions.  Liver biopsy was consistent with metastatic breast cancer with negative estrogen and progesterone receptors and positive HER2 receptor. She was initially treated with trastuzumab  with an excellent response with significant decrease in the size of her liver lesions and sustained response in 2023. She later had progression of this disease in the chest wall in January, 2023 and was switched to docetaxel /trastuzumab   and received 10 cycles with a good response. She then developed chest wall recurrence again in September, 2023 associated with supraclavicular adenopathy and was switched to Enhertu .  CT chest, abdomen, and pelvis in March, 2024 revealed stable subtle liver lesions with at least 1 lesion is no longer visualized. CT chest, abdomen and pelvis in October, 2024 revealed a decrease in the hepatic lesions with no new lesions seen.  CT imaging in April, 2025 was fairly stable except a slight increase in the left adrenal nodule. CT chest, abdomen, and pelvis done on 03/08/2024 that revealed a  significant interval enlargement of a left adrenal mass dramatically enlarged to 3.1 cm and consistent with worsened adrenal metastatic disease and a newly enlarged left retroperitoneal lymph node measuring 1.8 cm consistent with nodal metastatic disease. She has unchanged widespread sclerotic osseous metastatic disease throughout the axial and proximal appendicular skeleton and unchanged pathologic wedge deformities of T6 and L2. The liver appears clear now. 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  She also had the genetic mutation of TP53 C182* with no FDA approved therapies.    Malignant neoplasm metastatic to bone [facility]) Bone metastases diagnosed in December, 2022, for which she was placed on zoledronic  acid every 4 weeks. She is now receiving Zometa  every 6 weeks to correspond with her chemotherapy. CT imaging in April, 2025 did not reveal any evidence of progressive disease. He bone lesions appear stable.   Chest wall recurrence of breast cancer, left ([name]) Left supraclavicular lymph node measuring 2-3 cm. Left chest wall recurrence diagnosed in January, 2023 she developed involvement of subpectoral node and chest wall skin. This was rapidly progressive despite receiving  trastuzumab .  We have added in Perjeta  and Taxotere  to her current regimen and this has been very effective. Biopsy revealed carcinoma consistent with her previous breast cancer with negative estrogen and progesterone receptors and positive HER2 receptor.  She was initially treated with docetaxel /trastuzumab  and received 10 cycles with a good response.  She then had recurrence in the left chest wall in September 2023, so was switched to Enhertu  with good response.  There is no recurrence in the left chest wall at this time.   Polyneuropathy She has had chronic left hand pain,  initially thought to be related to lymphedema and neuropathy.  Her lymphedema was controlled. She has decreased  strength and muscle wasting.  She was referred to neurology for further evaluation.  EMG revealed polyneuropathy.  She has now seen orthopedics and had an MRI of the left brachial plexus that was unremarkable, but limited due to patient motion.  She had been on gabapentin  100 mg at bedtime, but discontinued that on her own. She saw Dr. [name] in neurology in October 2024 and underwent repeat EMG, which was more abnormal. She has been seen by Dr. [name] with [name] in [place], who did not feel surgery was a good option. The patient no longer sees occupational therapy, but does exercises at home.  Epiphora She is very frustrated by this. She informed me that she was told about a procedure where they can place stents (pyrex tubes) in her eyes tear ducts to help with her excessive tearing. She has made an appointment with her ophthalmologist, Dr. Hughie Bail in [place].  I encouraged her to pursue this as we know certain agents such as docetaxel  can cause pannicular stenosis of the tear ducts.  This has improved.   Plan: Patient states that she feels well but complains of chronic left handed nerve pain. She informed me that she was placed on Gabapentin  100 mg TID to aid in her left handed nerve pain. She informed me that once her temporary stents in her tear ducts had dissolved, her excessive tearing of the eyes returned. Due to this her and her ophthalmologist decided on permanent stents. She has had the left eye done for now. She is scheduled for a repeat echocardiogram next week, which we do every 6 months. Her day 1 cycle 3 of Kadcyla  is scheduled on 05/30/2024. She states that she is tolerating this well. She has a WBC of 5.6, hemoglobin of 12.6, and low platelet count of 134,000. Her CMP is normal other than an elevated calcium of 10.9 and AST of 60. She will be due for repeat scans at the end of February. I will see her back in 3 weeks with CBC and CMP. The patient understands the plans discussed  today and is in agreement with them.  She knows to contact our office if she develops concerns prior to her next visit.   I provided 16 minutes of face-to-face time during this encounter and > 50% was spent counseling as documented under my assessment and plan.   [name], MD  [facility] - A DEPT OF [name] [address] [place] [address] Dept: [phone] Dept Fax: [phone]   [name].  CHIEF COMPLAINT:  CC: Recurrent HER2 receptor positive breast cancer  Current Treatment:Kadcyla  every 3 weeks  HISTORY OF PRESENT ILLNESS:  Jasmin Lewis is a 89 year old woman with recurrent HER2 receptor positive. She was diagnosed with stage IIB (T2 N1 M0) HER2 receptor positive and hormone receptor negative left breast cancer in June 2020 when she was able to palpate a mass of the left breast. Mammogram and ultrasound in May 2020 revealed a 2.9 cm irregular mass in the upper outer left breast as well as two adjacent abnormal left axillary lymph nodes. Biopsy was obtained on June 3rd and revealed invasive ductal carcinoma and DCIS with lymphovascular invasion. Left axillary lymph node was positive for invasive ductal carcinoma. HER2 was positive 3+. Estrogen and progesterone receptors  were both negative at 0%, and Ki67 was 70%. Breast MRI from July revealed a 3.3 cm mass of the lateral portion of the left breast as well as significant non mass enhancement surrounding this mass and extending anteriorly into the nipple base, with largest diameter in the anterior to posterior axis, measuring 7.2 centimeters. Three enlarged left axillary lymph nodes. Right breast was negative.  PET imaging in July revealed no evidence of additional metastatic involvement. She received neoadjuvant trastuzumab /pertuzumab  for 6 cycles. Repeat breast MRI in October revealed complete resolution of the  enhancing mass and associated non mass enhancement within the left breast. Previously identified left axillary lymphadenopathy was no longer identified. She underwent left mastectomy and targeted lymph node dissection in November 2020.  Pathology revealed evidence of malignancy within the breast and three sentinel lymph nodes were negative for malignancy (0/3). She continued maintenance trastuzumab  injections for two more doses, but this was discontinued in December 2020 as she refused further ECHO scans.    Of note, she also has a history of stage II colon cancer, diagnosed in May 2007. Preop CEA was 5.2. She underwent surgical resection and surgical pathology revealed a 5.9 x 3.8 x 1.2 cm moderate to well-differentiated adenocarcinoma penetrating the muscular wall. Forty lymph nodes negative. No vascular or lymphatic invasion. Multiple diverticula with microabscess formation and inflammation. Carcinoma present in at least 1 diverticulum. She received adjuvant chemotherapy with capecitabine for 9 months. Colonoscopy from August 2013 revealed 2 polyps which were resected and chronic diverticulosis.  In December 2022, she was evaluated at the [facility] ED for abdominal and back pain.  CT abdomen pelvis revealed new lytic lesion involving the L2 vertebral body concerning for metastatic disease, as well as multiple hepatic rim-enhancing fluid collections with significant surrounding edema and an additional subtle 1.5 cm hypodensity in the hepatic dome. Given clinical history and presence of a new L2 lesion, leading differential consideration is metastatic disease. MRI abdomen in revealed numerous heterogeneously enhancing, internally necrotic lesions in the right hepatic lobe, highly concerning for metastatic disease. Largest lesion measures up to 6.7 cm in hepatic segment VI. Enhancing osseous lesions in the L2 vertebral body, both iliac bones, and in the sacrum, highly concerning for osseous metastases was also  seen.    PET scan revealed widespread hypermetabolic disease in the left chest wall, left axillary, subpectoral and supraclavicular nodes, liver, left adrenal and bone.  MRI thoracic/lumbar spine revealed widespread osseous metastasis with the largest deposit replacing L2 vertebral body where there was a compression fracture with mild height loss and early epidural tumor extension affecting L2-3 foramina.  MRI brain was negative for metastatic disease.  Biopsy of of the left chest wall  was consistent with recurrent ductal carcinoma of the breast.  HER2 was positive, estrogen and progesterone receptors were negative.  She was initially treated with trastuzumab , as well as zoledronic  acid, beginning in January 2023 with an initial response.  CT chest, abdomen, and pelvis done on 03/08/2024 that revealed a significant interval enlargement of a left adrenal mass consistent with worsened adrenal metastatic disease and a newly enlarged left retroperitoneal lymph node consistent with nodal metastatic disease. She has unchanged widespread sclerotic osseous metastatic disease throughout the axial and proximal appendicular skeleton and unchanged pathologic wedge deformities of T6 and L2. The liver appears clear now.  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    Oncology History Overview Note  Cancer Staging Malignant neoplasm of upper-outer quadrant of left breast in female, estrogen receptor negative ([name]) Staging form: Breast, AJCC 8th Edition - Clinical stage from 10/11/2018: Stage IIB (cT2, cN1, cM0, G3, ER-, PR-, HER2+) - Signed by Lanny Callander, MD on 11/02/2018 - Clinical: No stage assigned - Unsigned    History of colon  cancer, stage II  09/2005 Initial Diagnosis   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 Findings were a 5.9 x 3.8 x 1.2 cm moderate to well-differentiated adenocarcinoma penetrating the muscular wall. Forty lymph nodes negative. No vascular or lymphatic invasion. Multiple diverticula with microabscess formation and inflammation. Carcinoma present in at least 1 diverticulum. Preop CEA 5.2 with lab normal range 0 to 2.5. Preop CT scan with no obvious additional pathology.    2007 -  Chemotherapy   She received oral Xeloda chemotherapy as an adjuvant. She declined treatment on a clinical trial.    11/12/2011 Initial Diagnosis   [name], stage II   12/09/2011 Procedure   Followup colonoscopy done on 12/09/2011. She was found to have 2 polyps which were removed. Chronic diverticulosis.     Malignant neoplasm of upper-outer quadrant of left breast in female, estrogen receptor negative ([name])  09/26/2018 Mammogram   Mammogram/US  of left breast 09/26/18 IMPRESSION:  1. 2.9cm irregular mass in the upper outer left breast corresponds to the palpable abnormality. This is highly suspicious for breast carcinoma.  2. Two adjacent abnormal left axillary  Lymph nodes suspicious for metastatic adenopathy. There is a third borderline abnormal left axillary LN with a cortex thickened to 4mm.  3. Benign right breast cyst. No evidence of right breast malignancy.    10/11/2018 Cancer Staging   Staging form: Breast, AJCC 8th Edition - Clinical stage from 10/11/2018: Stage IIB (cT2, cN1, cM0, G3, ER-, PR-, HER2+) - Signed by Lanny Callander, MD on 11/02/2018   10/11/2018 Initial Biopsy   Diagnosis 10/11/18 1. Breast, left, needle core biopsy, upper outer left 2 o'clock - INVASIVE DUCTAL CARCINOMA. - DUCTAL CARCINOMA IN SITU. - LYMPHOVASCULAR INVASION IS IDENTIFIED. - SEE COMMENT. 2. Lymph node, needle/core biopsy, left  axilla - INVASIVE DUCTAL CARCINOMA. - SEE COMMENT.   10/11/2018 Receptors her2   The tumor cells are POSITIVE for Her2 (3+). Estrogen Receptor: 0%, NEGATIVE Progesterone Receptor: 0%, NEGATIVE Proliferation Marker Ki67: 70%   11/02/2018 Initial Diagnosis   Malignant neoplasm of upper-outer quadrant of left breast in female, estrogen receptor negative ([name])   11/15/2018 Breast MRI   MRI breast 11/15/18  IMPRESSION: 1. 3.3 centimeter mass in the LATERAL portion of the LEFT breast consistent with known malignancy. 2. There is significant non mass enhancement surrounding this mass and extending anteriorly into the nipple base, with largest diameter in the anterior to posterior axis, measuring 7.2 centimeters. 3. If the patient would consider breast conservation, additional MR guided core biopsies are recommended. Consider biopsy of the inferior and anterior extent of the non mass enhancement to document extent of disease. 4. Three enlarged LEFT axillary lymph nodes. 5. RIGHT breast is negative.   11/15/2018 PET scan   PET 11/15/18 IMPRESSION: Hypermetabolic left breast lesion compatible with known primary. Hypermetabolic left axillary lymph nodes are consistent with metastatic disease.   No evidence  for additional hypermetabolic metastatic involvement in the neck, chest, abdomen, or pelvis.   11/17/2018 - 03/01/2019 Chemotherapy   Neo-adjuvant Kadcyla  and perjeta  q3weeks for 6 cycles starting 11/17/18. Stopped before surgery    11/29/2018 Pathology Results   Diagnosis 1. Breast, left, needle core biopsy, inferior anterior (cylinder clip) - INVASIVE DUCTAL CARCINOMA. - DUCTAL CARCINOMA IN SITU. - LYMPHOVASCULAR INVASION IS IDENTIFIED. - SEE COMMENT. 2. Breast, left, needle core biopsy, central posterior (barbell clip) - INVASIVE DUCTAL CARCINOMA. - LYMPHOVASCULAR INVASION IS IDENTIFIED.   02/12/2019 Breast MRI   IMPRESSION: 1. Complete resolution of previously identified enhancing  mass and associated non mass enhancement within the left breast. This is consistent with excellent response to chemotherapy. No residual or suspicious findings are identified. 2. No MRI evidence of malignancy on the right. 3. Previously identified left axillary lymphadenopathy not definitively seen on today's study. However, this may be due to decreased field-of-view compared to prior study.     03/14/2019 Cancer Staging   Staging form: Breast, AJCC 8th Edition - Pathologic stage from 03/14/2019: pT0, pN0, cM0, GX, ER: Unknown, PR: Unknown, HER2: Not Assessed - Signed by Lanny Callander, MD on 03/28/2019   03/14/2019 Surgery   LEFT MASTECTOMY WITH TARGETED LYMPH NODE DISSECTION and Left Axillary Sentinel Lymph  Node Biopsy by Dr Curvin 03/14/19    03/14/2019 Pathology Results   FINAL MICROSCOPIC DIAGNOSIS:   A. LYMPH NODE, LEFT, SENTINEL, BIOPSY:  - There is no evidence of carcinoma in 1 of 1 lymph node (0/1).   B. BREAST, LEFT, MASTECTOMY:  - Benign breast parenchyma with treatment-related changes.  - There is no evidence of malignancy.  - See oncology table below.   C. LYMPH NODE, LEFT #1, SENTINEL, BIOPSY:  - There is no evidence of carcinoma in 1 of 1 lymph node (0/1).   D. LYMPH NODE, LEFT #2, SENTINEL, BIOPSY:  - There is no evidence of carcinoma in 1 of 1 lymph node (0/1).     04/04/2019 - 04/25/2019 Chemotherapy   Maintenance Herceptin  injections every 3 weeks starting 04/03/19 to complete 1 year of treatment that was started in 11/2018. Stopped after 2nd dose as she will not be repeating Echos.    06/03/2021 - 06/24/2021 Chemotherapy   Patient is on Treatment Plan : BREAST Trastuzumab  q21d X 11 Cycles     06/25/2021 - 01/01/2022 Chemotherapy   Patient is on Treatment Plan : BREAST Docetaxel  + Trastuzumab  + Pertuzumab  (THP) q21d     06/25/2021 - 01/02/2022 Chemotherapy   Patient is on Treatment Plan : BREAST Docetaxel  + Trastuzumab  + Pertuzumab  (THP) q21d     09/14/2021 Imaging    CT chest/abdomen/pelvis  IMPRESSION:  1. Interval resolution of previously seen bulky left axillary and  subpectoral lymphadenopathy. No persistently enlarged lymph nodes.  2. Multiple liver lesions are significantly diminished in size.  3. Widespread osseous metastatic disease, with an interval increase  in sclerosis of several previously lytic lesions.  4. Constellation of findings is consistent with treatment response  of metastatic disease  5. New, although age indeterminate pathologic wedge deformity of the  T6 vertebral body as well as an increased pathologic wedge deformity  of the L2 vertebral body.  6. Coronary artery disease.  Aortic Atherosclerosis (ICD10-I70.0).    01/26/2022 - 03/14/2024 Chemotherapy   Patient is on Treatment Plan : BREAST METASTATIC Fam-Trastuzumab Deruxtecan-nxki  (Enhertu ) (5.4) q21d     04/18/2024 -  Chemotherapy   Patient is on Treatment Plan : BREAST ADO-Trastuzumab Emtansine  (Kadcyla ) q21d  Malignant neoplasm metastatic to liver ([name])  04/17/2021 Imaging   CT ABDOMEN AND PELVIS WITH CONTRAST: -New lytic lesion involving the L2 vertebral body (6:72, 2:26) with minimal cortical breakthrough superiorly, but with preservation of vertebral body height, concerning for metastatic disease.  -There are several indeterminate hepatic lesions as described above. In addition to the lesions described above, there is an additional subtle 1.5 cm hypodensity in the hepatic dome (2:7). Given clinical history and presence of a new L2 lesion, leading differential consideration is metastatic disease. Recommend further evaluation with MRI of the abdomen with and without contrast.   05/12/2021 Imaging   MRI ABDOMEN WITH AND WITHOUT CONTRAST: Numerous heterogeneously enhancing, internally necrotic lesions in the right hepatic lobe, highly concerning for metastatic disease. Largest lesion measures up to 6.7 cm in hepatic segment VI.   Enhancing osseous lesions in the L2  vertebral body, both iliac bones, and in the sacrum, highly concerning for osseous metastases.   05/15/2021 Initial Diagnosis   Liver metastases ([name])   05/27/2021 PET scan   1. Widespread recurrent/metastatic disease in this patient who is  status post bilateral mastectomy. Left chest wall recurrence with  left axillary/subpectoral, supraclavicular nodal metastasis.  2. Hepatic, left adrenal, and widespread osseous metastasis.  3. Incidental findings, including: Right nephrolithiasis. Tiny  hiatal hernia.    05/27/2021 Imaging   MRI LUMBAR AND THORACIC SPINE: Thoracic spine:  Osseous metastatic disease involving each level. The most dramatic deposits are at T4 and T6 where there is extraosseous/epidural tumor. No cord compression but there is foraminal impingement on the left at T6-7 and on the right at T3-4. Early dorsal epidural tumor at the level of T10 and T3.   Lumbar spine:  1. Widespread osseous metastatic disease with largest deposit  replacing the L2 body where there is a compression fracture with  mild height loss. Also at this level is early epidural tumor  extension likely affecting both L2-3 foramina.  2. 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    05/27/2021 Imaging   MRI HEAD WITH AND WITHOUT CONTRAST: Negative for metastatic disease to the brain or calvarium.   06/03/2021 - 06/24/2021 Chemotherapy   Patient is on Treatment Plan : BREAST Trastuzumab  q21d X 11 Cycles     06/25/2021 - 01/01/2022 Chemotherapy   Patient is on Treatment Plan : BREAST Docetaxel  + Trastuzumab  + Pertuzumab  (THP) q21d     06/25/2021 - 01/02/2022 Chemotherapy   Patient is on Treatment Plan : BREAST Docetaxel  + Trastuzumab  + Pertuzumab  (THP) q21d     09/14/2021 Imaging   CT chest/abdomen/pelvis  IMPRESSION:  1. Interval resolution of previously seen bulky left axillary and  subpectoral lymphadenopathy. No persistently enlarged lymph nodes.  2. Multiple liver lesions  are significantly diminished in size.  3. Widespread osseous metastatic disease, with an interval increase  in sclerosis of several previously lytic lesions.  4. Constellation of findings is consistent with treatment response  of metastatic disease  5. New, although age indeterminate pathologic wedge deformity of the  T6 vertebral body as well as an increased pathologic wedge deformity  of the L2 vertebral body.  6. Coronary artery disease.  Aortic Atherosclerosis (ICD10-I70.0).    01/26/2022 - 03/14/2024 Chemotherapy   Patient is on Treatment Plan : BREAST METASTATIC Fam-Trastuzumab Deruxtecan-nxki  (Enhertu ) (5.4) q21d     04/18/2024 -  Chemotherapy   Patient is on Treatment Plan : BREAST ADO-Trastuzumab Emtansine  (Kadcyla ) q21d     Malignant neoplasm metastatic to bone ([name])  04/17/2021 Imaging   CT ABDOMEN AND PELVIS WITH CONTRAST: -New lytic lesion involving the L2 vertebral body (6:72, 2:26) with minimal cortical breakthrough superiorly, but with preservation of vertebral body height, concerning for metastatic disease.  -There are several indeterminate hepatic lesions as described above. In addition to the lesions described above, there is an additional subtle 1.5 cm hypodensity in the hepatic dome (2:7). Given clinical history and presence of a new L2 lesion, leading differential consideration is metastatic disease. Recommend further evaluation with MRI of the abdomen with and without contrast.   05/12/2021 Imaging   MRI ABDOMEN WITH AND WITHOUT CONTRAST: Numerous heterogeneously enhancing, internally necrotic lesions in the right hepatic lobe, highly concerning for metastatic disease. Largest lesion measures up to 6.7 cm in hepatic segment VI.   Enhancing osseous lesions in the L2 vertebral body, both iliac bones, and in the sacrum, highly concerning for osseous metastases.   05/15/2021 Initial Diagnosis   Bone metastases ([name])   05/27/2021 PET scan   1. Widespread  recurrent/metastatic disease in this patient who is  status post bilateral mastectomy. Left chest wall recurrence with  left axillary/subpectoral, supraclavicular nodal metastasis.  2. Hepatic, left adrenal, and widespread osseous metastasis.  3. Incidental findings, including: Right nephrolithiasis. Tiny  hiatal hernia.    05/27/2021 Imaging   MRI LUMBAR AND THORACIC SPINE: Thoracic spine:  Osseous metastatic disease involving each level. The most dramatic deposits are at T4 and T6 where there is extraosseous/epidural tumor. No cord compression but there is foraminal impingement on the left at T6-7 and on the right at T3-4. Early dorsal epidural tumor at the level of T10 and T3.   Lumbar spine:  1. Widespread osseous metastatic disease with largest deposit  replacing the L2 body where there is a compression fracture with  mild height loss. Also at this level is early epidural tumor  extension likely affecting both L2-3 foramina.  2. 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    05/27/2021 Imaging   MRI HEAD WITH AND WITHOUT CONTRAST: Negative for metastatic disease to the brain or calvarium.   06/03/2021 - 06/24/2021 Chemotherapy   Patient is on Treatment Plan : BREAST Trastuzumab  q21d X 11 Cycles     06/25/2021 - 01/01/2022 Chemotherapy   Patient is on Treatment Plan : BREAST Docetaxel  + Trastuzumab  + Pertuzumab  (THP) q21d     06/25/2021 - 01/02/2022 Chemotherapy   Patient is on Treatment Plan : BREAST Docetaxel  + Trastuzumab  + Pertuzumab  (THP) q21d     09/14/2021 Imaging   CT chest/abdomen/pelvis  IMPRESSION:  1. Interval resolution of previously seen bulky left axillary and  subpectoral lymphadenopathy. No persistently enlarged lymph nodes.  2. Multiple liver lesions are significantly diminished in size.  3. Widespread osseous metastatic disease, with an interval increase  in sclerosis of several previously lytic lesions.  4. Constellation of findings is  consistent with treatment response  of metastatic disease  5. New, although age indeterminate pathologic wedge deformity of the  T6 vertebral body as well as an increased pathologic wedge deformity  of the L2 vertebral body.  6. Coronary artery disease.  Aortic Atherosclerosis (ICD10-I70.0).    01/26/2022 - 03/14/2024 Chemotherapy   Patient is on Treatment Plan : BREAST METASTATIC Fam-Trastuzumab Deruxtecan-nxki  (Enhertu ) (5.4) q21d     04/18/2024 -  Chemotherapy   Patient is on Treatment Plan : BREAST ADO-Trastuzumab Emtansine  (Kadcyla ) q21d       INTERVAL HISTORY:  Brelee is here today for repeat clinical assessment  for her recurrent HER2 receptor positive breast cancer, previously treated with Taxane and Herceptin . When she progressed she was switched to Enhertu  and has done extremely well for 2 years. She had a CT chest, abdomen, and pelvis done on 03/08/2024 that revealed a significant interval enlargement of a left adrenal mass consistent with worsened adrenal metastatic disease and a newly enlarged left retroperitoneal lymph node consistent with nodal metastatic disease. She has unchanged widespread sclerotic osseous metastatic disease throughout the axial and proximal appendicular skeleton and unchanged pathologic wedge deformities of T6 and L2. The liver appears clear now.  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 Patient states that she feels well but complains of chronic left handed nerve pain. She informed me that she was placed on Gabapentin  100 mg TID to aid in her left handed nerve pain. She informed me that once her temporary stents in her tear ducts had dissolved, her excessive tearing of the eyes  returned. Due to this her and her ophthalmologist decided on permanent stents. She has had the left eye done for now. She is scheduled for a repeat echocardiogram next week, which we do every 6 months. Her day 1 cycle 3 of Kadcyla  is scheduled on 05/30/2024. She states that she is tolerating this well. She has a WBC of 5.6, hemoglobin of 12.6, and low platelet count of 134,000. Her CMP is normal other than an elevated calcium of 10.9 and AST of 60. She will be due for repeat scans at the end of February. I will see her back in 3 weeks with CBC and CMP.   She denies fever, chills, night sweats, or other signs of infection. She denies cardiorespiratory and gastrointestinal issues. Her appetite is decreased and Her weight has decreased 2 pounds over last 4 weeks.  REVIEW OF SYSTEMS:  Review of Systems  Constitutional:  Positive for appetite change (decreased). Negative for chills, fatigue, fever and unexpected weight change.  HENT:  Negative.  Negative for lump/mass, mouth sores and sore throat.   Eyes:  Positive for eye problems (excessive tearing).  Respiratory: Negative.  Negative for chest tightness, cough, hemoptysis, shortness of breath and wheezing.   Cardiovascular: Negative.  Negative for chest pain, leg swelling and palpitations.  Gastrointestinal: Negative.  Negative for abdominal distention, abdominal pain, blood in stool, constipation, diarrhea, nausea and vomiting.  Endocrine: Negative.  Negative for hot flashes.  Genitourinary: Negative.  Negative for difficulty urinating, dysuria, frequency and hematuria.   Musculoskeletal:  Positive for arthralgias and gait problem (uses a cane, occasionally). Negative for back pain, flank pain and myalgias.  Skin: Negative.  Negative for rash.  Neurological:  Positive for extremity weakness (atrophy and severe left hand weakness) and gait problem (uses a cane, occasionally). Negative for dizziness, headaches, light-headedness, numbness, seizures and  speech difficulty.  Hematological: Negative.  Negative for adenopathy. Does not bruise/bleed easily.  Psychiatric/Behavioral: Negative.  Negative for depression and sleep disturbance. The patient is not nervous/anxious.     VITALS:  Blood pressure (!) 149/89, pulse 75, temperature 97.9 F (36.6 C), temperature source Oral, resp. rate 18, height 5' 0.25 (1.53 m), weight 102 lb 6.4 oz (46.4 kg), SpO2 98%.  Wt Readings from Last 3 Encounters:  05/30/24 102 lb 6.4 oz (  46.4 kg)  05/09/24 104 lb 6.4 oz (47.4 kg)  04/18/24 106 lb (48.1 kg)    Body mass index is 19.83 kg/m.  Performance status (ECOG): 1 - Symptomatic but completely ambulatory  PHYSICAL EXAM:   Physical Exam Vitals and nursing note reviewed.  Constitutional:      General: She is not in acute distress.    Appearance: Normal appearance. She is normal weight. She is not ill-appearing, toxic-appearing or diaphoretic.  HENT:     Head: Normocephalic and atraumatic.     Right Ear: Tympanic membrane, ear canal and external ear normal. There is no impacted cerumen.     Left Ear: Tympanic membrane, ear canal and external ear normal. There is no impacted cerumen.     Nose: Nose normal. No congestion or rhinorrhea.     Mouth/Throat:     Mouth: Mucous membranes are moist.     Pharynx: Oropharynx is clear. No oropharyngeal exudate or posterior oropharyngeal erythema.  Eyes:     General: No scleral icterus.       Right eye: No discharge.        Left eye: No discharge.     Extraocular Movements: Extraocular movements intact.     Conjunctiva/sclera: Conjunctivae normal.     Pupils: Pupils are equal, round, and reactive to light.     Comments: Excessive tearing  Neck:     Vascular: No carotid bruit.  Cardiovascular:     Rate and Rhythm: Normal rate and regular rhythm.     Pulses: Normal pulses.     Heart sounds: Normal heart sounds. No murmur heard.    No friction rub. No gallop.  Pulmonary:     Effort: Pulmonary effort is  normal. No respiratory distress.     Breath sounds: Normal breath sounds. No stridor. No wheezing, rhonchi or rales.  Chest:     Chest wall: No tenderness.  Breasts:    Right: Normal.     Left: Normal.     Comments: Left chest wall is negative. Right breast is without masses Abdominal:     General: Bowel sounds are normal. There is no distension.     Palpations: Abdomen is soft. There is no hepatomegaly, splenomegaly or mass.     Tenderness: There is no abdominal tenderness. There is no right CVA tenderness, left CVA tenderness, guarding or rebound.     Hernia: No hernia is present.     Comments: Liver is 1 finger breath below the right costal margin  Musculoskeletal:        General: No swelling, tenderness, deformity or signs of injury. Normal range of motion.     Right hand: Normal.     Left hand: Decreased strength.     Cervical back: Normal range of motion and neck supple. No rigidity or tenderness.     Right lower leg: No edema.     Left lower leg: No edema.     Comments: Wasting of the left hand  Lymphadenopathy:     Cervical: No cervical adenopathy.     Right cervical: No superficial, deep or posterior cervical adenopathy.    Left cervical: No superficial, deep or posterior cervical adenopathy.     Upper Body:     Right upper body: No supraclavicular, axillary or pectoral adenopathy.     Left upper body: No supraclavicular, axillary or pectoral adenopathy.  Skin:    General: Skin is warm and dry.     Coloration: Skin is not jaundiced or pale.  Findings: No bruising, erythema, lesion or rash.  Neurological:     General: No focal deficit present.     Mental Status: She is alert and oriented to person, place, and time. Mental status is at baseline.     Cranial Nerves: No cranial nerve deficit.     Sensory: No sensory deficit.     Motor: No weakness.     Coordination: Coordination normal.     Gait: Gait normal.     Deep Tendon Reflexes: Reflexes normal.  Psychiatric:         Mood and Affect: Mood normal.        Behavior: Behavior normal.        Thought Content: Thought content normal.        Judgment: Judgment normal.    LABS:      Latest Ref Rng & Units 05/30/2024   12:47 PM 05/09/2024    8:33 AM 04/18/2024   11:07 AM  CBC  WBC 4.0 - 10.5 K/uL 5.6  4.0  5.1   Hemoglobin 12.0 - 15.0 g/dL 87.3  87.5  87.6   Hematocrit 36.0 - 46.0 % 38.2  36.8  35.9   Platelets 150 - 400 K/uL 134  125  137       Latest Ref Rng & Units 05/30/2024   12:47 PM 05/09/2024    8:33 AM 04/18/2024   11:07 AM  CMP  Glucose 70 - 99 mg/dL 96  884  891   BUN 8 - 23 mg/dL 19  30  17    Creatinine 0.44 - 1.00 mg/dL 9.27  9.18  9.11   Sodium 135 - 145 mmol/L 140  142  139   Potassium 3.5 - 5.1 mmol/L 4.2  4.0  4.1   Chloride 98 - 111 mmol/L 104  106  106   CO2 22 - 32 mmol/L 27  27  24    Calcium 8.9 - 10.3 mg/dL 89.0  89.2  9.9   Total Protein 6.5 - 8.1 g/dL 6.9  6.6  6.6   Total Bilirubin 0.0 - 1.2 mg/dL 0.6  0.5  0.6   Alkaline Phos 38 - 126 U/L 77  65  63   AST 15 - 41 U/L 60  59  47   ALT 0 - 44 U/L 28  30  21     Lab Results  Component Value Date   CEA1 1.8 06/03/2021   CEA 0.5 11/13/2012   CEA  Date Value Ref Range Status  06/03/2021 1.8 0.0 - 4.7 ng/mL Final    Comment:    (NOTE)                             Nonsmokers          <3.9                             Smokers             <5.6 [employer] Electrochemiluminescence Immunoassay (ECLIA) Values obtained with different assay methods or kits cannot be used interchangeably.  Results cannot be interpreted as absolute evidence of the presence or absence of malignant disease. Performed At: [facility] [address] [place], [place] [id number] Jennette Shorter MD Ey:[id number]   11/13/2012 0.5 0.0 - 5.0 ng/mL Final   Lab Results  Component Value Date   LDH 186 11/12/2011   LDH 187 09/08/2010  LDH 171 09/09/2009   STUDIES:  EXAM: 03/08/2024 CT CHEST, ABDOMEN, AND PELVIS WITH  CONTRAST IMPRESSION: 1. Significant interval enlargement of a left adrenal mass, consistent with worsened adrenal metastatic disease. 2. Newly enlarged left retroperitoneal lymph node, consistent with nodal metastatic disease. 3. Unchanged widespread sclerotic osseous metastatic disease throughout the axial and proximal appendicular skeleton. Unchanged pathologic wedge deformities of T6 and L2. 4. Left mastectomy and axillary lymph node dissection. 5. Coronary artery disease.   HISTORY:   Past Medical History:  Diagnosis Date   Arthritis    shoulder   Breast cancer ([name])    Cancer ([name])    left breast ca   Colon cancer ([name])    [name], stage II 11/12/2011   Sigmoid lesion 5.9 cm  40 nodes negative but focus of cancer in a diverticum   Pre-op CEA 5.2 with lab normal up to 2.5 resected 09/21/05   Xeloda adjuvant chemotherapy   Hypertension    Polyneuropathy 02/03/2023   Right shoulder pain 04/12/2023    Past Surgical History:  Procedure Laterality Date   COLONOSCOPY     HEMICOLECTOMY  2007   MASTECTOMY WITH AXILLARY LYMPH NODE DISSECTION Left 03/14/2019   Procedure: LEFT MASTECTOMY WITH TARGETED LYMPH NODE DISSECTION;  Surgeon: Curvin Deward MOULD, MD;  Location: [facility] OR;  Service: General;  Laterality: Left;   SENTINEL NODE BIOPSY Left 03/14/2019   Procedure: Left Axillary Sentinel Lymph  Node Biopsy;  Surgeon: Curvin Deward MOULD, MD;  Location: [facility] OR;  Service: General;  Laterality: Left;   TONSILLECTOMY     WISDOM TOOTH EXTRACTION      Family History  Problem Relation Age of Onset   Cancer Maternal Aunt        colon cancer     Social History:  reports that she has never smoked. She has never used smokeless tobacco. She reports that she does not drink alcohol and does not use drugs.The patient is alone today.  Allergies: No Known Allergies  Current Medications: Current Outpatient Medications  Medication Sig Dispense Refill   gabapentin  (NEURONTIN ) 100 MG capsule  Take 100 mg by mouth.     Biotin 1 MG CAPS Take by mouth.     bisacodyl (DULCOLAX) 5 MG EC tablet Take 5 mg by mouth daily as needed for moderate constipation. 2 capsules every night     co-enzyme Q-10 30 MG capsule Take 100 mg by mouth daily.     fish oil-omega-3 fatty acids 1000 MG capsule Take 1 g by mouth daily.     MAGNESIUM PO Take 1 tablet by mouth every evening. 600 mg     Multiple Vitamin (MULTIVITAMIN) capsule Take 1 capsule by mouth daily.     neomycin-polymyxin b-dexamethasone  (MAXITROL) 3.5-10000-0.1 SUSP SMARTSIG:In Eye(s)     ondansetron  (ZOFRAN ) 4 MG tablet Take 1 tablet (4 mg total) by mouth every 8 (eight) hours as needed for nausea or vomiting. (Patient not taking: Reported on 01/11/2024) 20 tablet 2   ondansetron  (ZOFRAN ) 8 MG tablet Take 1 tablet (8 mg total) by mouth every 8 (eight) hours as needed for nausea or vomiting. 30 tablet 1   polyethylene glycol (MIRALAX / GLYCOLAX) 17 g packet Take 17 g by mouth as needed. constipation     prochlorperazine  (COMPAZINE ) 10 MG tablet Take 1 tablet (10 mg total) by mouth every 6 (six) hours as needed for nausea or vomiting. 30 tablet 1   triamcinolone  cream (KENALOG ) 0.1 % Apply 1 Application topically 2 (two)  times daily. (Patient taking differently: Apply 1 Application topically 2 (two) times daily as needed.) 30 g 1   No current facility-administered medications for this visit.    I,Jasmine M Lassiter,acting as a scribe for [name], MD.,have documented all relevant documentation on the behalf of [name], MD,as directed by  [name], MD while in the presence of [name], MD.  "

## 2024-05-30 NOTE — Progress Notes (Signed)
 Adjust ado-trastuzumab  to 120 mg due to weight loss per Dr. Cornelius.

## 2024-05-31 ENCOUNTER — Other Ambulatory Visit: Payer: Self-pay

## 2024-06-01 ENCOUNTER — Encounter: Payer: Self-pay | Admitting: Oncology

## 2024-06-04 ENCOUNTER — Inpatient Hospital Stay

## 2024-06-05 ENCOUNTER — Ambulatory Visit

## 2024-06-07 ENCOUNTER — Telehealth: Payer: Self-pay

## 2024-06-07 ENCOUNTER — Other Ambulatory Visit: Payer: Self-pay | Admitting: Oncology

## 2024-06-07 ENCOUNTER — Other Ambulatory Visit: Payer: Self-pay

## 2024-06-07 DIAGNOSIS — C787 Secondary malignant neoplasm of liver and intrahepatic bile duct: Secondary | ICD-10-CM

## 2024-06-07 DIAGNOSIS — C50412 Malignant neoplasm of upper-outer quadrant of left female breast: Secondary | ICD-10-CM

## 2024-06-07 DIAGNOSIS — C7951 Secondary malignant neoplasm of bone: Secondary | ICD-10-CM

## 2024-06-07 NOTE — Telephone Encounter (Signed)
 Pt wanted to make sure you were aware of appt changes (ECHO, scans) - as initial appt's didn't work she said. You are scheduled to see her again 06/20/2024 w/labs and infusion.

## 2024-06-08 ENCOUNTER — Other Ambulatory Visit: Payer: Self-pay

## 2024-06-20 ENCOUNTER — Inpatient Hospital Stay: Attending: Hematology and Oncology

## 2024-06-20 ENCOUNTER — Inpatient Hospital Stay

## 2024-06-20 ENCOUNTER — Inpatient Hospital Stay: Admitting: Oncology

## 2024-07-03 ENCOUNTER — Ambulatory Visit

## 2024-07-05 ENCOUNTER — Inpatient Hospital Stay

## 2024-07-12 ENCOUNTER — Other Ambulatory Visit (HOSPITAL_BASED_OUTPATIENT_CLINIC_OR_DEPARTMENT_OTHER): Admitting: Radiology

## 2024-07-12 ENCOUNTER — Inpatient Hospital Stay
